# Patient Record
Sex: Female | Born: 1950 | Race: White | Hispanic: No | Marital: Married | Smoking: Former smoker
Health system: Southern US, Community
[De-identification: ages and names within clinical notes are randomized; demographics above are authoritative.]

## PROBLEM LIST (undated history)

## (undated) DIAGNOSIS — K52839 Microscopic colitis, unspecified: Secondary | ICD-10-CM

## (undated) DIAGNOSIS — J439 Emphysema, unspecified: Secondary | ICD-10-CM

## (undated) DIAGNOSIS — M810 Age-related osteoporosis without current pathological fracture: Secondary | ICD-10-CM

## (undated) DIAGNOSIS — Z87891 Personal history of nicotine dependence: Secondary | ICD-10-CM

## (undated) DIAGNOSIS — M419 Scoliosis, unspecified: Secondary | ICD-10-CM

## (undated) DIAGNOSIS — I700 Atherosclerosis of aorta: Secondary | ICD-10-CM

## (undated) DIAGNOSIS — T7840XA Allergy, unspecified, initial encounter: Secondary | ICD-10-CM

## (undated) DIAGNOSIS — J189 Pneumonia, unspecified organism: Secondary | ICD-10-CM

## (undated) DIAGNOSIS — E278 Other specified disorders of adrenal gland: Secondary | ICD-10-CM

## (undated) HISTORY — PX: TONSILLECTOMY AND ADENOIDECTOMY: SUR1326

## (undated) HISTORY — DX: Allergy, unspecified, initial encounter: T78.40XA

## (undated) HISTORY — DX: Atherosclerosis of aorta: I70.0

## (undated) HISTORY — PX: EXPLORATORY LAPAROTOMY: SUR591

## (undated) HISTORY — DX: Personal history of nicotine dependence: Z87.891

## (undated) HISTORY — PX: OTHER SURGICAL HISTORY: SHX169

## (undated) HISTORY — DX: Microscopic colitis, unspecified: K52.839

## (undated) HISTORY — DX: Scoliosis, unspecified: M41.9

## (undated) HISTORY — DX: Emphysema, unspecified: J43.9

## (undated) HISTORY — DX: Pneumonia, unspecified organism: J18.9

## (undated) HISTORY — DX: Age-related osteoporosis without current pathological fracture: M81.0

## (undated) HISTORY — DX: Other specified disorders of adrenal gland: E27.8

---

## 2003-02-23 ENCOUNTER — Encounter: Payer: Self-pay | Admitting: Family Medicine

## 2003-02-23 ENCOUNTER — Encounter: Admission: RE | Admit: 2003-02-23 | Discharge: 2003-02-23 | Payer: Self-pay | Admitting: Internal Medicine

## 2004-08-23 ENCOUNTER — Ambulatory Visit: Payer: Self-pay | Admitting: Family Medicine

## 2005-08-26 ENCOUNTER — Ambulatory Visit: Payer: Self-pay | Admitting: Family Medicine

## 2005-10-22 ENCOUNTER — Ambulatory Visit: Payer: Self-pay | Admitting: Internal Medicine

## 2006-06-25 ENCOUNTER — Encounter: Admission: RE | Admit: 2006-06-25 | Discharge: 2006-06-25 | Payer: Self-pay | Admitting: Family Medicine

## 2006-06-25 LAB — HM MAMMOGRAPHY

## 2006-09-18 ENCOUNTER — Ambulatory Visit: Payer: Self-pay | Admitting: Family Medicine

## 2007-04-30 ENCOUNTER — Ambulatory Visit: Payer: Self-pay | Admitting: Family Medicine

## 2007-04-30 DIAGNOSIS — M8580 Other specified disorders of bone density and structure, unspecified site: Secondary | ICD-10-CM | POA: Insufficient documentation

## 2007-04-30 DIAGNOSIS — Z87891 Personal history of nicotine dependence: Secondary | ICD-10-CM | POA: Insufficient documentation

## 2008-05-12 ENCOUNTER — Telehealth (INDEPENDENT_AMBULATORY_CARE_PROVIDER_SITE_OTHER): Payer: Self-pay | Admitting: Internal Medicine

## 2008-07-25 ENCOUNTER — Encounter (INDEPENDENT_AMBULATORY_CARE_PROVIDER_SITE_OTHER): Payer: Self-pay | Admitting: Internal Medicine

## 2008-10-14 HISTORY — PX: OTHER SURGICAL HISTORY: SHX169

## 2008-10-25 ENCOUNTER — Ambulatory Visit: Payer: Self-pay | Admitting: Family Medicine

## 2008-10-28 ENCOUNTER — Ambulatory Visit: Payer: Self-pay | Admitting: Family Medicine

## 2008-10-28 ENCOUNTER — Other Ambulatory Visit: Admission: RE | Admit: 2008-10-28 | Discharge: 2008-10-28 | Payer: Self-pay | Admitting: Family Medicine

## 2008-10-28 ENCOUNTER — Encounter: Payer: Self-pay | Admitting: Family Medicine

## 2008-10-31 LAB — CONVERTED CEMR LAB
AST: 26 units/L (ref 0–37)
Albumin: 4.2 g/dL (ref 3.5–5.2)
BUN: 23 mg/dL (ref 6–23)
Basophils Absolute: 0 10*3/uL (ref 0.0–0.1)
Basophils Relative: 0.1 % (ref 0.0–3.0)
Calcium: 9.3 mg/dL (ref 8.4–10.5)
Chloride: 105 meq/L (ref 96–112)
Creatinine, Ser: 1 mg/dL (ref 0.4–1.2)
Eosinophils Absolute: 0.1 10*3/uL (ref 0.0–0.7)
Eosinophils Relative: 0.6 % (ref 0.0–5.0)
GFR calc non Af Amer: 61 mL/min
HCT: 43 % (ref 36.0–46.0)
Hemoglobin: 15 g/dL (ref 12.0–15.0)
MCHC: 34.9 g/dL (ref 30.0–36.0)
MCV: 93 fL (ref 78.0–100.0)
Monocytes Absolute: 0.3 10*3/uL (ref 0.1–1.0)
Neutro Abs: 6.5 10*3/uL (ref 1.4–7.7)
RBC: 4.63 M/uL (ref 3.87–5.11)
WBC: 8.4 10*3/uL (ref 4.5–10.5)

## 2008-11-01 ENCOUNTER — Encounter (INDEPENDENT_AMBULATORY_CARE_PROVIDER_SITE_OTHER): Payer: Self-pay | Admitting: *Deleted

## 2008-11-08 ENCOUNTER — Ambulatory Visit: Payer: Self-pay | Admitting: Internal Medicine

## 2008-11-08 ENCOUNTER — Encounter: Payer: Self-pay | Admitting: Family Medicine

## 2010-12-24 ENCOUNTER — Encounter: Payer: Self-pay | Admitting: Family Medicine

## 2010-12-24 ENCOUNTER — Ambulatory Visit (INDEPENDENT_AMBULATORY_CARE_PROVIDER_SITE_OTHER): Payer: PRIVATE HEALTH INSURANCE | Admitting: Family Medicine

## 2010-12-24 DIAGNOSIS — F17200 Nicotine dependence, unspecified, uncomplicated: Secondary | ICD-10-CM

## 2010-12-24 DIAGNOSIS — M810 Age-related osteoporosis without current pathological fracture: Secondary | ICD-10-CM

## 2011-01-01 NOTE — Assessment & Plan Note (Signed)
Summary: TRANSFER FROM [facility]/REFILL MED/QUIT SMOKING/CLE  COVENTRY HE...   Vital Signs:  Patient profile:   60 year old female Height:      63.5 inches Weight:      134.25 pounds BMI:     23.49 Temp:     98.4 degrees F oral Pulse rate:   88 / minute Pulse rhythm:   regular BP sitting:   130 / 84  (left arm) Cuff size:   regular  Vitals Entered By: Selena Batten Dance CMA Duncan Dull) (December 24, 2010 10:37 AM) CC: Transfer from [facility]/Med refill/wants to quit smoking   History of Present Illness: CC: [facility]  1. osteoporosis - actonel monthly for last 5-6 years.  takes calcium/vit D as well.  actually off since June 2011 as a bisphosphonate holiday, ready to restart.  2. tobacco abuse - smoking for last 18 years up to 1 ppd, about 2 cig/day currently.  wants to quit.  has tried wellbutrin in past.  has had script for chantix in past.  wellbutrin did work in past.  has used gum and patch in past, they kept her calm during day.  Using patch daily.    preventative:  tetanus - last  ~5-6 yrs ago (at work).  2005/6 well woman - usually here, last was 2 years ago.  last blood work as well here then.  Current Medications (verified): 1)  Calcium 600/vitamin D 600-200 Mg-Unit  Tabs (Calcium Carbonate-Vitamin D) .... Take 1 Tablet By Mouth Two Times A Day 2)  Actonel 150 Mg Tabs (Risedronate Sodium) .Marland Kitchen.. 1 By Mouth Monthly 3)  Chantix Starting Month Pak 0.5 Mg X 11 & 1 Mg X 42  Misc (Varenicline Tartrate) .... 0.5mg  By Mouth Once Daily For 3 Days, Then Twice Daily For 4 Days and Then 1mg  By Mouth 2 Times Daily 4)  Chantix 1 Mg Tabs (Varenicline Tartrate) .Marland Kitchen.. 1 Tab By Mouth 2 Times Daily 5)  Multivitamins  Tabs (Multiple Vitamin) .Marland Kitchen.. 1 By Mouth Once Daily  Allergies: 1)  ! Pcn  Past History:  Past Medical History: Last updated: 10/25/2008 Osteoporosis Tobacco Abuse  Past Surgical History: veins from legs removed  last Dexa 2010  Family History: GM: DM, osteoporosis, CVA,  CAD/MI (older)  No CA  Social History: smoking 2 cig/day, social EtOH, no rec drugs caffeine: 1 cup coffee/day Occupation: runs dental lab  Lives with husband, outside dog  Review of Systems  The patient denies anorexia, fever, weight loss, weight gain, vision loss, decreased hearing, hoarseness, chest pain, syncope, dyspnea on exertion, peripheral edema, prolonged cough, headaches, hemoptysis, abdominal pain, melena, hematochezia, severe indigestion/heartburn, hematuria, depression, and breast masses.    Physical Exam  General:  alert, no acute distress, well hydrated, well developed, and well nourished.   Head:  Normocephalic and atraumatic without obvious abnormalities. No apparent alopecia or balding. Mouth:  injected, no exudate Neck:  No deformities, masses, or tenderness noted.  no bruits Lungs:  Normal respiratory effort, chest expands symmetrically. Lungs are clear to auscultation, no crackles or wheezes. Heart:  normal rate, regular rhythm, and no murmur.   Abdomen:  nontender, no guarding, normal BS, no hepatosplenomegaly, and no hernias.   Msk:  No deformity or scoliosis noted of thoracic or lumbar spine.   Pulses:  2+ rad pulses bilaterally, brisk cap refill Extremities:  no pedal edema Neurologic:  CN grossly intact, station and gait intact Skin:  turgor normal and color normal.   Psych:  full affect, pleasant and  cooperative   Impression & Recommendations:  Problem # 1:  OSTEOPOROSIS (ICD-733.00) refilled actonel.  check vit D next blood draw.  discussed different treatments of OP including bisphosphonates and cal/vit D.  discussed bisphosphonate holiday.  pt wants to restart.  Her updated medication list for this problem includes:    Calcium 600/vitamin D 600-200 Mg-unit Tabs (Calcium carbonate-vitamin d) .Marland Kitchen... Take 1 tablet by mouth two times a day    Actonel 150 Mg Tabs (Risedronate sodium) .Marland Kitchen... 1 by mouth monthly  Problem # 2:  TOBACCO ABUSE  (ICD-305.1) discussed different methods to quit.  pt currently on patch.  would like to restart wellbutrin.  discussed this method as well as setting quit date.  RTC 1 mo for f/u.  The following medications were removed from the medication list:    Chantix Starting Month Pak 0.5 Mg X 11 & 1 Mg X 42 Misc (Varenicline tartrate) .Marland Kitchen... 0.5mg  by mouth once daily for 3 days, then twice daily for 4 days and then 1mg  by mouth 2 times daily    Chantix 1 Mg Tabs (Varenicline tartrate) .Marland Kitchen... 1 tab by mouth 2 times daily Her updated medication list for this problem includes:    Buproban 150 Mg Xr12h-tab (Bupropion hcl (smoking deter)) .Marland Kitchen... Take one daily for 4 days then one by mouth two times a day  Problem # 3:  HEALTH MAINTENANCE EXAM (ICD-V70.0) cardiovascular risk - no personal/family hx at early age, no HTN, DM.  not obese.  working on quitting smoking.  further risk stratify with FLP  Complete Medication List: 1)  Multivitamins Tabs (Multiple vitamin) .Marland Kitchen.. 1 by mouth once daily 2)  Calcium 600/vitamin D 600-200 Mg-unit Tabs (Calcium carbonate-vitamin d) .... Take 1 tablet by mouth two times a day 3)  Actonel 150 Mg Tabs (Risedronate sodium) .Marland Kitchen.. 1 by mouth monthly 4)  Vitamin B-12 100 Mcg Tabs (Cyanocobalamin) .... One daily 5)  Buproban 150 Mg Xr12h-tab (Bupropion hcl (smoking deter)) .... Take one daily for 4 days then one by mouth two times a day  Patient Instructions: 1)  return in 1 month for follow up. 2)  Return fasting for blood work at your convenience [FLP, CMP, TSH, CBC, vit D v70.0, 733.00, 305.1] 3)  [email or web address] - look for resources. 4)  trial of wellbutrin again for smoking cessation. 5)  actonel refilled for 1 year 6)  wellbutrin to quit smoking - start 1 pill daily for 4 days then increase to 2 pills daily.  quit date 1-2 weeks into wellbutrin. 7)  Good to see you today, call clinic with quesitons Prescriptions: BUPROBAN 150 MG XR12H-TAB (BUPROPION HCL (SMOKING DETER))  take one daily for 4 days then one by mouth two times a day  #60 x 1   Entered and Authorized by:   Eustaquio Boyden  MD   Signed by:   Eustaquio Boyden  MD on 12/24/2010   Method used:   Electronically to        [facility] [address] (retail)       [address]       [place], [place]  [id number]       Ph: [phone]       Fax: [phone]   RxID:   [id number] ACTONEL 150 MG TABS (RISEDRONATE SODIUM) 1 by mouth monthly  #1 Tablet x 11   Entered and Authorized by:   Eustaquio Boyden  MD   Signed by:   Eustaquio Boyden  MD on 12/24/2010  Method used:   Electronically to        [facility] (retail)       [address]       [place], [place]  [id number]       Ph: [phone]       Fax: [phone]   RxID:   [id number]    Orders Added: 1)  Est. Patient Level IV [27253]    Current Allergies (reviewed today): ! PCN  Appended Document: TRANSFER FROM [facility]/REFILL MED/QUIT SMOKING/CLE  COVENTRY HE...    Clinical Lists Changes  Observations: Added new observation of PAST SURG HX: veins from legs removed  DEXA - osteoporosis 10/2008 (12/24/2010 13:27)        Allergies: 1)  ! Pcn   Past History:  Past Surgical History: veins from legs removed  DEXA - osteoporosis 10/2008  Appended Document: TRANSFER FROM [facility]/REFILL MED/QUIT SMOKING/CLE  COVENTRY HE...    Clinical Lists Changes  Observations: Added new observation of MAMMOGRAM: Birads1 (06/25/2006 21:19) Added new observation of TD BOOSTER: Td (04/04/2004 21:21)         -  Date:  06/25/2006    Mammogram Birads1  Date:  04/04/2004    TD booster Td

## 2011-01-17 ENCOUNTER — Other Ambulatory Visit: Payer: Self-pay | Admitting: Family Medicine

## 2011-01-17 ENCOUNTER — Encounter: Payer: Self-pay | Admitting: Family Medicine

## 2011-01-17 DIAGNOSIS — F17200 Nicotine dependence, unspecified, uncomplicated: Secondary | ICD-10-CM

## 2011-01-17 DIAGNOSIS — M810 Age-related osteoporosis without current pathological fracture: Secondary | ICD-10-CM

## 2011-01-17 DIAGNOSIS — Z0000 Encounter for general adult medical examination without abnormal findings: Secondary | ICD-10-CM

## 2011-01-22 ENCOUNTER — Other Ambulatory Visit (INDEPENDENT_AMBULATORY_CARE_PROVIDER_SITE_OTHER): Payer: PRIVATE HEALTH INSURANCE | Admitting: Family Medicine

## 2011-01-22 DIAGNOSIS — M810 Age-related osteoporosis without current pathological fracture: Secondary | ICD-10-CM

## 2011-01-22 DIAGNOSIS — Z0000 Encounter for general adult medical examination without abnormal findings: Secondary | ICD-10-CM

## 2011-01-22 DIAGNOSIS — F17200 Nicotine dependence, unspecified, uncomplicated: Secondary | ICD-10-CM

## 2011-01-22 LAB — CBC WITH DIFFERENTIAL/PLATELET
Basophils Relative: 0.3 % (ref 0.0–3.0)
Eosinophils Absolute: 0.1 10*3/uL (ref 0.0–0.7)
Eosinophils Relative: 0.9 % (ref 0.0–5.0)
Hemoglobin: 14.8 g/dL (ref 12.0–15.0)
Lymphocytes Relative: 12.2 % (ref 12.0–46.0)
MCHC: 33.7 g/dL (ref 30.0–36.0)
Monocytes Relative: 5.1 % (ref 3.0–12.0)
Neutro Abs: 9.3 10*3/uL — ABNORMAL HIGH (ref 1.4–7.7)
RBC: 4.55 Mil/uL (ref 3.87–5.11)

## 2011-01-22 LAB — LIPID PANEL
HDL: 86.2 mg/dL (ref >39.00)
LDL Cholesterol: 92 mg/dL (ref 0–99)
Total CHOL/HDL Ratio: 2
Triglycerides: 80 mg/dL (ref 0.0–149.0)

## 2011-01-22 LAB — COMPREHENSIVE METABOLIC PANEL
ALT: 25 U/L (ref 0–35)
CO2: 30 mEq/L (ref 19–32)
Creatinine, Ser: 0.9 mg/dL (ref 0.4–1.2)
GFR: 64.57 mL/min (ref >60.00)
Total Bilirubin: 0.8 mg/dL (ref 0.3–1.2)

## 2011-01-24 ENCOUNTER — Ambulatory Visit: Payer: PRIVATE HEALTH INSURANCE | Admitting: Family Medicine

## 2011-06-19 ENCOUNTER — Other Ambulatory Visit: Payer: Self-pay | Admitting: Internal Medicine

## 2011-06-20 NOTE — Telephone Encounter (Signed)
Not previously on list.  Denied.

## 2011-06-20 NOTE — Telephone Encounter (Signed)
Please Advise refills 

## 2011-07-15 HISTORY — PX: OTHER SURGICAL HISTORY: SHX169

## 2011-07-22 ENCOUNTER — Telehealth: Payer: Self-pay | Admitting: *Deleted

## 2011-07-22 NOTE — Telephone Encounter (Signed)
Prior Berkley Harvey is needed for actonel, form is in your in box.

## 2011-07-22 NOTE — Telephone Encounter (Signed)
Please ask if has taken fosamax or boniva in past, if not will need to switch to fosamax.

## 2011-07-23 NOTE — Telephone Encounter (Signed)
Message left for patient to return my call and advise about previous meds.

## 2011-07-25 MED ORDER — ALENDRONATE SODIUM 70 MG PO TABS: 70.0000 mg | ORAL_TABLET | ORAL | Status: DC

## 2011-07-25 NOTE — Telephone Encounter (Signed)
Patient notified and will pick up Rx today. 

## 2011-07-25 NOTE — Telephone Encounter (Signed)
Spoke with patient. She said she had tried Fosamax in the past. She said she didn't have any problems with it, but said that [facility] had wanted her to switch to the Actonel because it was "new and improved". She said if she needed to go back on the fosamax, she would. She said whichever med you think is the best for her is what she will use.

## 2011-07-25 NOTE — Telephone Encounter (Signed)
will change to fosamax weekly.

## 2011-08-06 ENCOUNTER — Ambulatory Visit (INDEPENDENT_AMBULATORY_CARE_PROVIDER_SITE_OTHER): Payer: BC Managed Care – PPO | Admitting: Family Medicine

## 2011-08-06 ENCOUNTER — Ambulatory Visit: Payer: PRIVATE HEALTH INSURANCE | Admitting: Family Medicine

## 2011-08-06 ENCOUNTER — Encounter: Payer: Self-pay | Admitting: Family Medicine

## 2011-08-06 VITALS — BP 140/80 | HR 88 | Temp 98.7°F | Wt 132.8 lb

## 2011-08-06 DIAGNOSIS — I10 Essential (primary) hypertension: Secondary | ICD-10-CM

## 2011-08-06 DIAGNOSIS — R51 Headache: Secondary | ICD-10-CM | POA: Insufficient documentation

## 2011-08-06 DIAGNOSIS — F17200 Nicotine dependence, unspecified, uncomplicated: Secondary | ICD-10-CM

## 2011-08-06 DIAGNOSIS — R0789 Other chest pain: Secondary | ICD-10-CM

## 2011-08-06 DIAGNOSIS — R519 Headache, unspecified: Secondary | ICD-10-CM | POA: Insufficient documentation

## 2011-08-06 LAB — BASIC METABOLIC PANEL
Calcium: 9.3 mg/dL (ref 8.4–10.5)
GFR: 87.68 mL/min (ref >60.00)
Glucose, Bld: 101 mg/dL — ABNORMAL HIGH (ref 70–99)
Potassium: 5.1 mEq/L (ref 3.5–5.1)
Sodium: 144 mEq/L (ref 135–145)

## 2011-08-06 NOTE — Assessment & Plan Note (Addendum)
Not typical chest pain - not exhertional, not relieved by rest, not crescendo.   However risk factors include smoking, recent HTN (although no hx HTN), fmhx CAD (not at early age).   Lab Results  Component Value Date   LDLCALC 92 01/22/2011  last LDL acceptable range. Check blood work today - refer to cards for further risk stratification. Encouraged smoking cessation. No chest pain today. EKG today - NSR 86, normal axis, normal intervals.  no ST/T changes. Recommend start aspirin 81mg  daily, may take enteric coalt

## 2011-08-06 NOTE — Assessment & Plan Note (Signed)
Encouraged cessation, pt willing to try zyban, has script at home.

## 2011-08-06 NOTE — Progress Notes (Signed)
Subjective:    Patient ID: Lindsay Mason, female    DOB: 1951-07-09, 60 y.o.   MRN: [id number]  HPI CC: check bp  Bad headache 2 weekends ago, last Monday with bad HA with vision changes (tunnelling) when driving, had to pull over.  Tuesday jaw pain and chest pain.  Did not seek medical care.  Did take some aspirin on Tuesday.  Mid SOB over last few weeks.  Wednesday noticed R eye redness, no pain.  Saturday went to health clinic at church, bp checked and was 184/114.  Never this high in past.  Not stressed.  Brings log of bp's have otherwise been 111-141/70-91.  Endorses trouble with fine motor skills at work recently - hitting wrong letters on keyboard but was able to go to work all week long.  Some left hand numbness.  Chest tightness described as heaviness in chest, episodes occurring randomly, not necessarily associated with exhertion, not relieved with rest.  Takes aspirin to relieve discomfort.  Last occurred last night, lasted 1 hour.  Has gotten nauseated with them.  No diaphoresis.  No presyncope or LOC, other vision changes, leg swelling.  No confusion, trouble speaking or slurred speech, facial droop.  No lack of coordination.  Osteoporosis - previously on actonel for several years, recently changed to fosamax.  Has been on bisphosphonate for years.  Did have holiday for 6 months.  Back to smoking - zyban had worked well in past.  Went through stressful situation in 06-17-2023 - mother died, sudden but expected.  Restarted smoking, curently 4-5 cig/day.  Does want to quit again, has zyban refill.  Lab Results  Component Value Date   LDLCALC 92 01/22/2011   Medications and allergies reviewed and updated in chart.  Past histories reviewed and updated if relevant as below. Patient Active Problem List  Diagnoses  . TOBACCO ABUSE  . OSTEOPOROSIS   Past Medical History  Diagnosis Date  . Routine general medical examination at a health care facility   . Osteoporosis, unspecified   .  Tobacco use disorder    Past Surgical History  Procedure Date  . Vein removal     removed from legs  . Dexa 10/2008    Osteoporosis   History  Substance Use Topics  . Smoking status: Current Everyday Smoker    Types: Cigarettes  . Smokeless tobacco: Not on file   Comment: 4-5 cig/day  . Alcohol Use: Yes     social   Family History  Problem Relation Age of Onset  . Osteoporosis      GM  . Stroke Maternal Grandmother   . Coronary artery disease Maternal Grandfather 50  . Diabetes Maternal Grandfather   . Coronary artery disease Paternal Grandmother 62  . Coronary artery disease Paternal Grandfather 16  . Cancer Neg Hx    Allergies  Allergen Reactions  . Penicillins     REACTION: swelling tongue   Current Outpatient Prescriptions on File Prior to Visit  Medication Sig Dispense Refill  . alendronate (FOSAMAX) 70 MG tablet Take 1 tablet (70 mg total) by mouth every 7 (seven) days. Take with a full glass of water on an empty stomach.  4 tablet  6  . Calcium Carbonate-Vitamin D (CALCIUM 600+D) 600-200 MG-UNIT TABS Take 1 tablet by mouth 2 (two) times daily.        . Multiple Vitamin (MULTIVITAMIN) tablet Take 1 tablet by mouth daily.        . vitamin B-12 (CYANOCOBALAMIN) 100 MCG  tablet Take 100 mcg by mouth daily.        Marland Kitchen buPROPion (ZYBAN) 150 MG 12 hr tablet Take 150 mg by mouth as directed.         Review of Systems Per HPI    Objective:   Physical Exam  Nursing note and vitals reviewed. Constitutional: She appears well-developed and well-nourished. No distress.  HENT:  Head: Normocephalic and atraumatic.  Mouth/Throat: Oropharynx is clear and moist. No oropharyngeal exudate.  Eyes: EOM are normal. Pupils are equal, round, and reactive to light. Right conjunctiva is not injected. Right conjunctiva has a hemorrhage. Left conjunctiva is not injected. Left conjunctiva has no hemorrhage. No scleral icterus.         R nasal subconjunctival hemorrhage PERRLA, EOMI    Neck: Normal range of motion. Neck supple. Carotid bruit is not present.  Cardiovascular: Normal rate, regular rhythm, normal heart sounds and intact distal pulses.   No murmur heard. Pulmonary/Chest: Effort normal and breath sounds normal. No respiratory distress. She has no wheezes. She has no rales.  Abdominal: Soft. Bowel sounds are normal. She exhibits no distension and no mass. There is no tenderness. There is no rebound and no guarding.       No abd/renal bruits  Musculoskeletal: She exhibits no edema.  Lymphadenopathy:    She has no cervical adenopathy.  Neurological: She has normal strength. No cranial nerve deficit or sensory deficit. She exhibits normal muscle tone. She displays a negative Romberg sign. Coordination normal.       Normal finger to nose.  Skin: Skin is warm and dry. No rash noted.  Psychiatric: She has a normal mood and affect.      Assessment & Plan:

## 2011-08-06 NOTE — Patient Instructions (Signed)
I am worried about the heart and the head.  I would like [place] to set you up for ct scan of head and then refer you to heart doctor. Pass by Marion's office to set this up. Keep an eye on blood pressures - if staying elevated call me with an update. If any chest pain or worsening headache, please let us know. I'd remove contacts for now. Good to see you today, call us with questions.

## 2011-08-06 NOTE — Assessment & Plan Note (Addendum)
With subconjunctival hemorrhage L side in setting of severely elevated BP readings this past week. Given recent headaches and elevated bp, also endorsing numbness of left hand and fine motor trouble, check CT head to r/o CVA. Noted concern for CVA, will not start bp med, seems to be stabilizing on it's own.  In office 140/80. rec start baby ASA, rec smoking cessation.

## 2011-08-07 LAB — CK TOTAL AND CKMB (NOT AT ARMC)
CK, MB: 2.6 ng/mL (ref 0.3–4.0)
Total CK: 75 U/L (ref 7–177)

## 2011-08-07 LAB — TROPONIN I: Troponin I: <0.01 ng/mL (ref <0.06)

## 2011-08-08 ENCOUNTER — Encounter: Payer: Self-pay | Admitting: Cardiology

## 2011-08-08 ENCOUNTER — Telehealth: Payer: Self-pay | Admitting: Family Medicine

## 2011-08-08 ENCOUNTER — Ambulatory Visit (INDEPENDENT_AMBULATORY_CARE_PROVIDER_SITE_OTHER)
Admission: RE | Admit: 2011-08-08 | Discharge: 2011-08-08 | Disposition: A | Discharge status: Home / Self Care | Payer: BC Managed Care – PPO | Source: Ambulatory Visit | Attending: Family Medicine | Admitting: Family Medicine

## 2011-08-08 ENCOUNTER — Ambulatory Visit (INDEPENDENT_AMBULATORY_CARE_PROVIDER_SITE_OTHER): Payer: BC Managed Care – PPO | Admitting: Cardiology

## 2011-08-08 DIAGNOSIS — R0789 Other chest pain: Secondary | ICD-10-CM

## 2011-08-08 DIAGNOSIS — R51 Headache: Secondary | ICD-10-CM

## 2011-08-08 DIAGNOSIS — I10 Essential (primary) hypertension: Secondary | ICD-10-CM

## 2011-08-08 DIAGNOSIS — F17200 Nicotine dependence, unspecified, uncomplicated: Secondary | ICD-10-CM

## 2011-08-08 NOTE — Telephone Encounter (Signed)
Spoke with patient - unsure if did have stroke, concern for this.  Will set up witt MRI.  placed order in chart and routed to [facility]

## 2011-08-08 NOTE — Patient Instructions (Signed)
Your physician has requested that you have an exercise tolerance test. For further information please visit [email or web address]. Please also follow instruction sheet, as given.  The current medical regimen is effective;  continue present plan and medications.  

## 2011-08-08 NOTE — Assessment & Plan Note (Signed)
We had a long discussion about this and she plans to resume Zyban.

## 2011-08-08 NOTE — Telephone Encounter (Signed)
Called home, just left for work.  Will try later.  Would like to set up with MRI head w/w/o contrast given abnormal head CT  BP Readings from Last 3 Encounters:  08/08/11 126/86  08/06/11 140/80  12/24/10 130/84

## 2011-08-08 NOTE — Progress Notes (Signed)
HPI The patient presents for evaluation of chest pain.  She reports some upper epigastric discomfort and mild jaw discomfort about a week ago. This lasted for about one hour. She also had a headache. A couple of days later she developed a conjunctival hemorrhage. She went to a health fair and was noted to have a blood pressure of 184/114. She started to take her blood pressure at home following this and was up slightly for one day but has come down otherwise. She had in retrospect noticed some visual disturbances at the beginning of all of this with some "white spots" in the peripheral vision. Since this chest discomfort is happened she sat a slightly different discomfort in the left upper chest. It happens at rest but usually following emotional stress. She has a fairly stressful job. It is mild. There is no radiation. It might last 20-30 minutes. There are no associated symptoms such as nausea vomiting or diaphoresis though she might get very slightly SOB.  Allergies  Allergen Reactions  . Penicillins     REACTION: swelling tongue    Current Outpatient Prescriptions  Medication Sig Dispense Refill  . alendronate (FOSAMAX) 70 MG tablet Take 1 tablet (70 mg total) by mouth every 7 (seven) days. Take with a full glass of water on an empty stomach.  4 tablet  6  . aspirin EC 81 MG tablet Take 81 mg by mouth daily.        . Calcium Carbonate-Vitamin D (CALCIUM 600+D) 600-200 MG-UNIT TABS Take 1 tablet by mouth 2 (two) times daily.        Marland Kitchen doxycycline (VIBRA-TABS) 100 MG tablet as directed.       . Multiple Vitamin (MULTIVITAMIN) tablet Take 1 tablet by mouth daily.        . vitamin B-12 (CYANOCOBALAMIN) 100 MCG tablet Take 100 mcg by mouth daily.          Past Medical History  Diagnosis Date  . Osteoporosis, unspecified   . Tobacco use disorder     Past Surgical History  Procedure Date  . Vein removal     removed from legs  . Dexa 10/2008    Osteoporosis    Family History  Problem  Relation Age of Onset  . Osteoporosis      GM  . Stroke Maternal Grandmother   . Coronary artery disease Maternal Grandfather 50  . Diabetes Maternal Grandfather   . Coronary artery disease Paternal Grandmother 67  . Coronary artery disease Paternal Grandfather 67  . Cancer Neg Hx     History   Social History  . Marital Status: Married    Spouse Name: N/A    Number of Children: N/A  . Years of Education: N/A   Occupational History  . runs dental lab    Social History Main Topics  . Smoking status: Current Everyday Smoker    Types: Cigarettes  . Smokeless tobacco: Not on file   Comment: 4-5 cig/day  . Alcohol Use: Yes     social  . Drug Use: No  . Sexually Active: Not on file   Other Topics Concern  . Not on file   Social History Narrative   Caffeine: 1 cup coffee/dayLives with husband, outside dog   ROS:  Positive for toe cramping.  Otherwise as stated in the HPI and negative for all other systems.  PHYSICAL EXAM BP 126/86  Pulse 84  Ht 5' 3.5" (1.613 m)  Wt 131 lb (59.421 kg)  BMI 22.84  kg/m2 GENERAL:  Well appearing HEENT:  Pupils equal round and reactive, fundi not visualized, oral mucosa unremarkable NECK:  No jugular venous distention, waveform within normal limits, carotid upstroke brisk and symmetric, no bruits, no thyromegaly LYMPHATICS:  No cervical, inguinal adenopathy LUNGS:  Clear to auscultation bilaterally BACK:  No CVA tenderness CHEST:  Unremarkable HEART:  PMI not displaced or sustained,S1 and S2 within normal limits, no S3, no S4, no clicks, no rubs, no murmurs ABD:  Flat, positive bowel sounds normal in frequency in pitch, no bruits, no rebound, no guarding, no midline pulsatile mass, no hepatomegaly, no splenomegaly EXT:  2 plus pulses throughout, no edema, no cyanosis no clubbing SKIN:  No rashes no nodules NEURO:  Cranial nerves II through XII grossly intact, motor grossly intact throughout PSYCH:  Cognitively intact, oriented to person  place and time  EKG:  Sinus rhythm, rate 86, axis within normal limits, intervals within normal limits, no acute ST-T wave changes.   ASSESSMENT AND PLAN

## 2011-08-08 NOTE — Assessment & Plan Note (Signed)
Her blood pressure is OK at home. No therapy is indicated.

## 2011-08-08 NOTE — Assessment & Plan Note (Signed)
The chest discomfort is predominantly atypical though there are some typical features. She does have risk factors. I do think the pretest probability of obstructive coronary disease as an etiology is low. I will bring the patient back for a POET (Plain Old Exercise Test). This will allow me to screen for obstructive coronary disease, risk stratify and very importantly provide a prescription for exercise.

## 2011-08-09 MED ORDER — LORAZEPAM 1 MG PO TABS: ORAL_TABLET | ORAL | Status: DC

## 2011-08-09 NOTE — Telephone Encounter (Signed)
Pt requests nerve medicine for MRI scheduled for Sunday.  spoke with pt.  Called in ativan.

## 2011-08-11 ENCOUNTER — Other Ambulatory Visit: Payer: BC Managed Care – PPO

## 2011-08-11 ENCOUNTER — Ambulatory Visit
Admission: RE | Admit: 2011-08-11 | Discharge: 2011-08-11 | Disposition: A | Discharge status: Home / Self Care | Payer: BC Managed Care – PPO | Source: Ambulatory Visit | Attending: Family Medicine | Admitting: Family Medicine

## 2011-08-11 DIAGNOSIS — R51 Headache: Secondary | ICD-10-CM

## 2011-08-11 DIAGNOSIS — I10 Essential (primary) hypertension: Secondary | ICD-10-CM

## 2011-08-11 DIAGNOSIS — F17200 Nicotine dependence, unspecified, uncomplicated: Secondary | ICD-10-CM

## 2011-08-11 MED ORDER — GADOBENATE DIMEGLUMINE 529 MG/ML IV SOLN
10.0000 mL | Freq: Once | INTRAVENOUS | Status: AC | PRN
  Administered 2011-08-11: 10 mL via INTRAVENOUS

## 2011-08-12 ENCOUNTER — Telehealth: Payer: Self-pay | Admitting: Family Medicine

## 2011-08-12 ENCOUNTER — Other Ambulatory Visit: Payer: BC Managed Care – PPO

## 2011-08-12 NOTE — Telephone Encounter (Signed)
Please notify MRI showed no evidence of acute stroke. It did show either age and smoking related plaque buildup in the brain or raised possibility of multiple sclerosis. Before last week, had she had any episodes of transient vision loss, arm or leg numbness, or period where she was more unsteady on her feet?  If so, I'd like to refer to neurology.  If not, I'd like her to come in to further discuss results.

## 2011-08-12 NOTE — Telephone Encounter (Signed)
Spoke with patient. No episodes of transient neurological changes. Will watch for now. Will return to see me in next 1-2 mo for f/u to monitor bp and recheck cholesterol levels, more fully discuss imaging results.  bp running slightly elevated - 130-150s/80-90s.  Pt hesitant to start meds, wants to work on diet changes - discussed low salt, high potassium.  If staying elevated, agrees to start med when returns to see me.

## 2011-08-13 ENCOUNTER — Ambulatory Visit: Payer: BC Managed Care – PPO | Admitting: Cardiology

## 2011-09-19 ENCOUNTER — Telehealth: Payer: Self-pay | Admitting: Internal Medicine

## 2011-09-19 ENCOUNTER — Emergency Department (HOSPITAL_COMMUNITY)
Admission: EM | Admit: 2011-09-19 | Discharge: 2011-09-19 | Disposition: A | Discharge status: Home / Self Care | Payer: BC Managed Care – PPO | Attending: Emergency Medicine | Admitting: Emergency Medicine

## 2011-09-19 ENCOUNTER — Encounter (HOSPITAL_COMMUNITY): Payer: Self-pay | Admitting: Emergency Medicine

## 2011-09-19 ENCOUNTER — Other Ambulatory Visit: Payer: Self-pay

## 2011-09-19 ENCOUNTER — Emergency Department (HOSPITAL_COMMUNITY): Payer: BC Managed Care – PPO

## 2011-09-19 DIAGNOSIS — Z79899 Other long term (current) drug therapy: Secondary | ICD-10-CM | POA: Insufficient documentation

## 2011-09-19 DIAGNOSIS — R059 Cough, unspecified: Secondary | ICD-10-CM | POA: Insufficient documentation

## 2011-09-19 DIAGNOSIS — R0602 Shortness of breath: Secondary | ICD-10-CM | POA: Insufficient documentation

## 2011-09-19 DIAGNOSIS — J3489 Other specified disorders of nose and nasal sinuses: Secondary | ICD-10-CM | POA: Insufficient documentation

## 2011-09-19 DIAGNOSIS — R630 Anorexia: Secondary | ICD-10-CM | POA: Insufficient documentation

## 2011-09-19 DIAGNOSIS — R55 Syncope and collapse: Secondary | ICD-10-CM | POA: Insufficient documentation

## 2011-09-19 DIAGNOSIS — M810 Age-related osteoporosis without current pathological fracture: Secondary | ICD-10-CM | POA: Insufficient documentation

## 2011-09-19 DIAGNOSIS — R197 Diarrhea, unspecified: Secondary | ICD-10-CM | POA: Insufficient documentation

## 2011-09-19 DIAGNOSIS — R05 Cough: Secondary | ICD-10-CM | POA: Insufficient documentation

## 2011-09-19 DIAGNOSIS — J40 Bronchitis, not specified as acute or chronic: Secondary | ICD-10-CM | POA: Insufficient documentation

## 2011-09-19 DIAGNOSIS — R071 Chest pain on breathing: Secondary | ICD-10-CM | POA: Insufficient documentation

## 2011-09-19 DIAGNOSIS — Z7982 Long term (current) use of aspirin: Secondary | ICD-10-CM | POA: Insufficient documentation

## 2011-09-19 DIAGNOSIS — R110 Nausea: Secondary | ICD-10-CM | POA: Insufficient documentation

## 2011-09-19 LAB — URINALYSIS, ROUTINE W REFLEX MICROSCOPIC
Glucose, UA: NEGATIVE mg/dL
Ketones, ur: 15 mg/dL — AB
Protein, ur: 100 mg/dL — AB
pH: 5.5 (ref 5.0–8.0)

## 2011-09-19 LAB — DIFFERENTIAL
Basophils Absolute: 0 10*3/uL (ref 0.0–0.1)
Basophils Relative: 0 % (ref 0–1)
Eosinophils Absolute: 0 10*3/uL (ref 0.0–0.7)
Monocytes Relative: 8 % (ref 3–12)
Neutro Abs: 5.3 10*3/uL (ref 1.7–7.7)
Neutrophils Relative %: 82 % — ABNORMAL HIGH (ref 43–77)

## 2011-09-19 LAB — URINE MICROSCOPIC-ADD ON

## 2011-09-19 LAB — COMPREHENSIVE METABOLIC PANEL
ALT: 34 U/L (ref 0–35)
AST: 51 U/L — ABNORMAL HIGH (ref 0–37)
Albumin: 4.4 g/dL (ref 3.5–5.2)
Alkaline Phosphatase: 78 U/L (ref 39–117)
BUN: 16 mg/dL (ref 6–23)
Chloride: 103 mEq/L (ref 96–112)
Potassium: 3.6 mEq/L (ref 3.5–5.1)
Sodium: 143 mEq/L (ref 135–145)
Total Bilirubin: 0.2 mg/dL — ABNORMAL LOW (ref 0.3–1.2)
Total Protein: 7.4 g/dL (ref 6.0–8.3)

## 2011-09-19 LAB — CARDIAC PANEL(CRET KIN+CKTOT+MB+TROPI)
CK, MB: 5 ng/mL — ABNORMAL HIGH (ref 0.3–4.0)
Total CK: 723 U/L — ABNORMAL HIGH (ref 7–177)

## 2011-09-19 LAB — CBC
Hemoglobin: 16.3 g/dL — ABNORMAL HIGH (ref 12.0–15.0)
MCH: 32 pg (ref 26.0–34.0)
MCHC: 33.3 g/dL (ref 30.0–36.0)
Platelets: 187 10*3/uL (ref 150–400)

## 2011-09-19 MED ORDER — AZITHROMYCIN 250 MG PO TABS: ORAL_TABLET | ORAL | Status: DC

## 2011-09-19 MED ORDER — HYDROCOD POLST-CHLORPHEN POLST 10-8 MG/5ML PO LQCR: 5.0000 mL | Freq: Two times a day (BID) | ORAL | Status: DC

## 2011-09-19 NOTE — ED Notes (Signed)
Report received, assumed care.  

## 2011-09-19 NOTE — Telephone Encounter (Signed)
Patient's husband called and stated that the patient has been sick x3 days with congestion and stomach bug. This morning when she was standing in the kitchen she began to shake violently in her arms and passed out. I talked with the Dr. Reece Agar CMA and since no one had any openings we instructed her to go to the ER. That this could be a neurological problem.      Previous note closed in error.

## 2011-09-19 NOTE — ED Provider Notes (Signed)
History     CSN: [id number] Arrival date & time: 09/19/2011 12:57 PM   None     Chief Complaint  Patient presents with  . Loss of Consciousness    (Consider location/radiation/quality/duration/timing/severity/associated sxs/prior treatment) HPI Comments: Patient is a 60 year old woman who has been sick with a cold this week she has had a bad cough and left pleuritic chest pain. Has also been some nausea and diarrhea. He has not eaten very much in the past 3 days. This morning she was in the kitchen, and had twitching of her hands and head, and collapsed on the floor, was unconscious for about 15-30 seconds. She had no prior episode of syncope. There was no chest pain. This event happened around 9 or 9:30 AM.  Patient is a 60 y.o. female presenting with syncope. The history is provided by the patient and the spouse. No language interpreter was used.  Loss of Consciousness This is a new problem. The current episode started 3 to 5 hours ago. The problem has been gradually improving. Associated symptoms comments: Cough and shortness of breath and left pleuritic chest pain.. The symptoms are aggravated by nothing. The symptoms are relieved by nothing. She has tried nothing for the symptoms.    Past Medical History  Diagnosis Date  . Osteoporosis, unspecified   . Tobacco use disorder     Past Surgical History  Procedure Date  . Vein removal     removed from legs  . Dexa 10/2008    Osteoporosis  . Tonsillectomy and adenoidectomy   . Exploratory laparotomy     Family History  Problem Relation Age of Onset  . Osteoporosis      GM  . Stroke Maternal Grandmother   . Coronary artery disease Maternal Grandfather 50  . Diabetes Maternal Grandfather   . Coronary artery disease Paternal Grandmother 66  . Coronary artery disease Paternal Grandfather 51  . Cancer Neg Hx     History  Substance Use Topics  . Smoking status: Current Everyday Smoker    Types: Cigarettes  . Smokeless  tobacco: Not on file   Comment: 4-5 cig/day  . Alcohol Use: Yes     social    OB History    Grav Para Term Preterm Abortions TAB SAB Ect Mult Living                  Review of Systems  Constitutional: Negative.  Negative for fever.  HENT: Positive for congestion and rhinorrhea.   Eyes: Negative.   Respiratory: Positive for cough.        Left lateral pleuritic chest pain.  Cardiovascular: Positive for syncope.       Syncope.  Gastrointestinal: Negative.   Genitourinary: Negative.   Musculoskeletal: Negative.   Neurological: Positive for syncope.  Psychiatric/Behavioral: Negative.     Allergies  Penicillins  Home Medications   Current Outpatient Rx  Name Route Sig Dispense Refill  . ALENDRONATE SODIUM 70 MG PO TABS Oral Take 70 mg by mouth every 7 (seven) days. sundays     . ASPIRIN EC 81 MG PO TBEC Oral Take 81 mg by mouth daily.      Marland Kitchen CALCIUM CARBONATE-VITAMIN D 600-200 MG-UNIT PO TABS Oral Take 1 tablet by mouth 2 (two) times daily.      Marland Kitchen ONE-DAILY MULTI VITAMINS PO TABS Oral Take 1 tablet by mouth daily.      Marland Kitchen VITAMIN B-12 100 MCG PO TABS Oral Take 100 mcg by mouth daily.      Marland Kitchen  AZITHROMYCIN 250 MG PO TABS  Take 2 tablets today, then 1 every day until finished. 6 tablet 0  . HYDROCOD POLST-CHLORPHEN POLST 10-8 MG/5ML PO LQCR Oral Take 5 mLs by mouth every 12 (twelve) hours. 60 mL 0  . DOXYCYCLINE HYCLATE 100 MG PO TABS Oral Take 25 mg by mouth See admin instructions. Pt takes a 1/4 tablet 4 times a day      BP 104/63  Pulse 106  Temp(Src) 98.5 F (36.9 C) (Oral)  Resp 28  SpO2 98%  Physical Exam  Nursing note and vitals reviewed. Constitutional: She is oriented to person, place, and time. She appears well-developed and well-nourished. No distress.  HENT:  Head: Normocephalic and atraumatic.  Right Ear: External ear normal.  Left Ear: External ear normal.  Mouth/Throat: Oropharynx is clear and moist.  Eyes: Conjunctivae and EOM are normal. Pupils are  equal, round, and reactive to light.  Neck: Normal range of motion. Neck supple.  Cardiovascular: Normal rate, regular rhythm and normal heart sounds.   Pulmonary/Chest: Effort normal and breath sounds normal.  Abdominal: Soft. Bowel sounds are normal.  Musculoskeletal: Normal range of motion.  Neurological: She is alert and oriented to person, place, and time.       No sensory or motor deficit.  Skin: Skin is warm and dry.  Psychiatric: She has a normal mood and affect. Her behavior is normal.    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  ST/T Wave abnormalities: nonspecific ST/T changes  Conduction Disutrbances:none  Narrative Interpretation: Abnormal eKG.  Old EKG Reviewed: none available   Results for orders placed during the hospital encounter of 09/19/11  CBC      Component Value Range   WBC 6.5  4.0 - 10.5 (K/uL)   RBC 5.09  3.87 - 5.11 (MIL/uL)   Hemoglobin 16.3 (*) 12.0 - 15.0 (g/dL)   HCT 16.1 (*) 09.6 - 46.0 (%)   MCV 96.3  78.0 - 100.0 (fL)   MCH 32.0  26.0 - 34.0 (pg)   MCHC 33.3  30.0 - 36.0 (g/dL)   RDW 04.5  40.9 - 81.1 (%)   Platelets 187  150 - 400 (K/uL)  DIFFERENTIAL      Component Value Range   Neutrophils Relative 82 (*) 43 - 77 (%)   Neutro Abs 5.3  1.7 - 7.7 (K/uL)   Lymphocytes Relative 10 (*) 12 - 46 (%)   Lymphs Abs 0.6 (*) 0.7 - 4.0 (K/uL)   Monocytes Relative 8  3 - 12 (%)   Monocytes Absolute 0.5  0.1 - 1.0 (K/uL)   Eosinophils Relative 0  0 - 5 (%)   Eosinophils Absolute 0.0  0.0 - 0.7 (K/uL)   Basophils Relative 0  0 - 1 (%)   Basophils Absolute 0.0  0.0 - 0.1 (K/uL)  COMPREHENSIVE METABOLIC PANEL      Component Value Range   Sodium 143  135 - 145 (mEq/L)   Potassium 3.6  3.5 - 5.1 (mEq/L)   Chloride 103  96 - 112  (mEq/L)   CO2 28  19 - 32 (mEq/L)   Glucose, Bld 83  70 - 99 (mg/dL)   BUN 16  6 - 23 (mg/dL)   Creatinine, Ser 9.14  0.50 - 1.10 (mg/dL)  Calcium 9.2  8.4 - 10.5 (mg/dL)   Total Protein 7.4  6.0 - 8.3 (g/dL)   Albumin 4.4  3.5 - 5.2 (g/dL)   AST 51 (*) 0 - 37 (U/L)   ALT 34  0 - 35 (U/L)   Alkaline Phosphatase 78  39 - 117 (U/L)   Total Bilirubin 0.2 (*) 0.3 - 1.2 (mg/dL)   GFR calc non Af Amer 53 (*) >90 (mL/min)   GFR calc Af Amer 62 (*) >90 (mL/min)  URINALYSIS, ROUTINE W REFLEX MICROSCOPIC      Component Value Range   Color, Urine YELLOW  YELLOW    APPearance TURBID (*) CLEAR    Specific Gravity, Urine 1.030  1.005 - 1.030    pH 5.5  5.0 - 8.0    Glucose, UA NEGATIVE  NEGATIVE (mg/dL)   Hgb urine dipstick LARGE (*) NEGATIVE    Bilirubin Urine MODERATE (*) NEGATIVE    Ketones, ur 15 (*) NEGATIVE (mg/dL)   Protein, ur 161 (*) NEGATIVE (mg/dL)   Urobilinogen, UA 1.0  0.0 - 1.0 (mg/dL)   Nitrite NEGATIVE  NEGATIVE    Leukocytes, UA SMALL (*) NEGATIVE   CARDIAC PANEL(CRET KIN+CKTOT+MB+TROPI)      Component Value Range   Total CK 723 (*) 7 - 177 (U/L)   CK, MB 5.0 (*) 0.3 - 4.0 (ng/mL)   Troponin I <0.30  <0.30 (ng/mL)   Relative Index 0.7  0.0 - 2.5   URINE MICROSCOPIC-ADD ON      Component Value Range   Squamous Epithelial / LPF RARE  RARE    WBC, UA 0-2  <3 (WBC/hpf)   RBC / HPF 3-6  <3 (RBC/hpf)   Bacteria, UA RARE  RARE    Crystals CA OXALATE CRYSTALS (*) NEGATIVE    Urine-Other MUCOUS PRESENT     Dg Chest 2 View    09/19/2011  *RADIOLOGY REPORT*  Clinical Data: Syncopal episode today.  CHEST - 2 VIEW  Comparison: None.  Findings: Moderate hyperinflation suggesting COPD.  Normal heart size.  No infiltrates or failure.  No bony abnormality.  No effusion or pneumothorax.  IMPRESSION: COPD, no active disease.  Original Report Authenticated By: Elsie Stain, M.D.    4:54 PM Lab workup was reassuringly negative.  Will get orthostatic vital signs.  Plan to discharge  with Rx for z-pak and Tussionex.     4:54 PM Pt is mildly orthostatic.  Will release.  Advised to force fluids.   1. Vasovagal syncope   2. Bronchitis             Carleene Cooper III, MD 09/19/11 1655

## 2011-09-19 NOTE — Telephone Encounter (Signed)
Patient's husband called and stated that the patient has been sick x3 days with congestion and stomach bug.  This morning when she was standing in the kitchen she began to shake violently in her arms and passed out.  I talked with the Dr. Reece Agar CMA and since no one had any openings we  instructed her to go to the ER.  That this could be a neurological problem.

## 2011-09-19 NOTE — ED Notes (Signed)
Pt here with witnessed syncopal episode by husband and was eased to floor; pt sts some shaking or arms and head immediately before event; pt with LOC lasting approx 15 seconds and was alert when awoke; pt with congestion and dry cough lately and some chest heaviness intermittant x several weeks; pt sts pain in left rib area from coughing; pt alert at present

## 2011-09-19 NOTE — Telephone Encounter (Signed)
Agree with ER eval for syncope workup.

## 2011-09-20 LAB — URINE CULTURE
Colony Count: 50000
Culture  Setup Time: 201212061538

## 2011-09-27 ENCOUNTER — Encounter: Payer: BC Managed Care – PPO | Admitting: Cardiology

## 2011-10-02 ENCOUNTER — Encounter: Payer: Self-pay | Admitting: Family Medicine

## 2011-10-02 ENCOUNTER — Ambulatory Visit (INDEPENDENT_AMBULATORY_CARE_PROVIDER_SITE_OTHER): Payer: BC Managed Care – PPO | Admitting: Family Medicine

## 2011-10-02 VITALS — BP 110/80 | HR 84 | Temp 98.2°F | Wt 128.5 lb

## 2011-10-02 DIAGNOSIS — I10 Essential (primary) hypertension: Secondary | ICD-10-CM

## 2011-10-02 DIAGNOSIS — J40 Bronchitis, not specified as acute or chronic: Secondary | ICD-10-CM

## 2011-10-02 DIAGNOSIS — R930 Abnormal findings on diagnostic imaging of skull and head, not elsewhere classified: Secondary | ICD-10-CM | POA: Insufficient documentation

## 2011-10-02 DIAGNOSIS — R0789 Other chest pain: Secondary | ICD-10-CM

## 2011-10-02 DIAGNOSIS — F17200 Nicotine dependence, unspecified, uncomplicated: Secondary | ICD-10-CM

## 2011-10-02 MED ORDER — HYDROCOD POLST-CHLORPHEN POLST 10-8 MG/5ML PO LQCR
5.0000 mL | Freq: Two times a day (BID) | ORAL | Status: DC
Start: 2011-10-02 — End: 2011-12-30

## 2011-10-02 NOTE — Progress Notes (Signed)
  Subjective:    Patient ID: Lindsay Mason, female    DOB: Oct 31, 1950, 60 y.o.   MRN: [id number]  HPI CC: chest congestion  Seen 09/19/2011 at ER with episode of syncope.  Records reviewed, dx vasovagal.  Happened while in kitchen, felt bad seconds prior to passing out then hands started shaking, then LOC for 15-30 sec per husband who was there to catch her.  Came to, no confusion afterwards.  Went to ER for evaluation.  Placed on zpack and tussionex by ER, continued mucinex.  Illness started early 09/2011.  Since then, continued chest congestion although improving.  Today feeling much better than yesterday.  Cough mildly productive of yellow sputum.  Intermittent chills on and off.  + SOB and heaviness worsened since bronchitis.  Feeling very tired/fatigued with this chest congestion.  Mild sinus pressure.  Long h/o smoking.  Also endorses pain and bruising left abd at border of ribcage, came on after coughing, now improving.  Improved head congestion.  No ST, ear pain, tooth pain, abd pain, n/v.  No AMS, confusion, slurred speech, paresthesias, numbness or weakness in arms or legs.  + sick contacts at home as well as husband.  + smokers at home (pt and husband).  Smoking improved but continued.  3-4 cig/day.  No h/o asthma.  No formal dx COPD.  Has stress test scheduled for 10/27/2010.  Endorsing intermittent heaviness in chest, worsened since recent bronchitis.  Feels fatigued with this.  Compliant with aspirin 81mg  daily.  Keeping track of BP, improved numbers off meds in last several weeks.  Abnormal MRI 08/08/2011 - Multiple abnormal foci of T2 and FLAIR signal affecting the deep  and subcortical hemispheric white matter. Most extensive area of  involvement is adjacent to the posterior body of the left lateral  ventricle. The differential diagnosis is chronic small vessel  ischemic change of the white matter versus demyelinating  disease/multiple sclerosis. None of the lesions shows  restricted  diffusion or contrast enhancement to suggest active nature.  Review of Systems Per HPI    Objective:   Physical Exam  Nursing note and vitals reviewed. Constitutional: She appears well-developed and well-nourished. No distress.  HENT:  Head: Normocephalic and atraumatic.  Right Ear: External ear normal.  Left Ear: External ear normal.  Nose: No mucosal edema or rhinorrhea. Right sinus exhibits no maxillary sinus tenderness and no frontal sinus tenderness. Left sinus exhibits no maxillary sinus tenderness and no frontal sinus tenderness.  Mouth/Throat: Uvula is midline, oropharynx is clear and moist and mucous membranes are normal. No oropharyngeal exudate.  Eyes: Conjunctivae and EOM are normal. Pupils are equal, round, and reactive to light. No scleral icterus.  Neck: Normal range of motion. Neck supple.  Cardiovascular: Normal rate, regular rhythm, normal heart sounds and intact distal pulses.   No murmur heard. Pulmonary/Chest: Effort normal and breath sounds normal. No respiratory distress. She has no wheezes. She has no rales.       No left sided ribcage pain to palpation.  Musculoskeletal: She exhibits no edema.  Lymphadenopathy:    She has no cervical adenopathy.  Skin: Skin is warm and dry. No rash noted.       Linear bruise left lower ribcage on skin, nontender.       Assessment & Plan:

## 2011-10-02 NOTE — Assessment & Plan Note (Signed)
Anticipate contributing to several of sxs today. Currently resolving.  Did not place on further antibiotics. Refilled tussionex. Update me if not improving as expected. Anticipate component of COPD, however lungs clear today.

## 2011-10-02 NOTE — Assessment & Plan Note (Addendum)
Anticipate more chronic small vessel ischemic changes as no sxs of demyelinating condition. Pt knows to watch for paresthesias, numbness, or weakness unilateral and will notify me for referral to neurology. Compliant with baby aspirin.  BP stable.

## 2011-10-02 NOTE — Assessment & Plan Note (Signed)
BP improved off meds. Continue to monitor.

## 2011-10-02 NOTE — Patient Instructions (Signed)
Call your insurace about the shingles shot to see if it is covered or how much it would cost and where is cheaper (here or pharmacy).  If you want to receive here, call for nurse visit. I think bronchitis is resolving. Return to see me after stress test (january or feruary) Pass by Marion's office to see if we can do stress test sooner (early January ideally). Good to see you today, let us know if any quesitons.

## 2011-10-02 NOTE — Assessment & Plan Note (Signed)
Anticipate current resolving bronchitis contributing however will see if able to move forward scheduled stress test.

## 2011-10-02 NOTE — Assessment & Plan Note (Signed)
Continue to strongly encourage cessation. Contemplative today.

## 2011-10-15 HISTORY — PX: OTHER SURGICAL HISTORY: SHX169

## 2011-10-22 ENCOUNTER — Encounter: Payer: Self-pay | Admitting: Cardiovascular Disease

## 2011-10-22 ENCOUNTER — Ambulatory Visit (INDEPENDENT_AMBULATORY_CARE_PROVIDER_SITE_OTHER): Payer: BC Managed Care – PPO | Admitting: Cardiovascular Disease

## 2011-10-22 VITALS — BP 122/80 | HR 96 | Ht 63.0 in | Wt 129.0 lb

## 2011-10-22 DIAGNOSIS — R0789 Other chest pain: Secondary | ICD-10-CM

## 2011-10-22 NOTE — Patient Instructions (Signed)
Treadmill study is normal. Good exercise tolerance. No further cardiac workup needed at this time.

## 2011-10-22 NOTE — Progress Notes (Signed)
Exercise Treadmill Test  Treadmill ordered for recent epsiodes of syncope.  Resting EKG shows NSR with rate of 96 bpm, no Significant ST or T wave changes Resting blood pressure of 122/80. Stand bruce protocal was used.  Patient exercised for 7 min 16 sec,  Peak heart rate of 157 bpm.  This was 90% of the maximum predicted heart rate (target heart rate 160). Achieved 10.1 METS No symptoms of chest pain or lightheadedness were reported at peak stress or in recovery.  Peak Blood pressure recorded was 180/94. Heart rate at 3 minutes in recovery was 99 bpm. No significant ST abnormality concerning for ischemia. Maximum depression  noted was -0.8 mm upsloping Frequent PVCs noted during exercise but did improve at peak exercise  FINAL IMPRESSION: Normal exercise stress test. No significant EKG changes concerning for ischemia. PVCs noted with exercise. She was asymptomatic. Excellent exercise tolerance.

## 2011-10-28 ENCOUNTER — Encounter: Payer: BC Managed Care – PPO | Admitting: Cardiology

## 2011-12-13 DIAGNOSIS — E278 Other specified disorders of adrenal gland: Secondary | ICD-10-CM

## 2011-12-13 DIAGNOSIS — J189 Pneumonia, unspecified organism: Secondary | ICD-10-CM

## 2011-12-13 HISTORY — DX: Other specified disorders of adrenal gland: E27.8

## 2011-12-13 HISTORY — DX: Pneumonia, unspecified organism: J18.9

## 2011-12-30 ENCOUNTER — Other Ambulatory Visit: Payer: Self-pay

## 2011-12-30 ENCOUNTER — Emergency Department (HOSPITAL_COMMUNITY): Payer: BC Managed Care – PPO

## 2011-12-30 ENCOUNTER — Inpatient Hospital Stay (HOSPITAL_COMMUNITY)
Admission: EM | Admit: 2011-12-30 | Discharge: 2012-01-01 | DRG: 090 | Disposition: A | Discharge status: Home / Self Care | Payer: BC Managed Care – PPO | Attending: Internal Medicine | Admitting: Internal Medicine

## 2011-12-30 ENCOUNTER — Encounter (HOSPITAL_COMMUNITY): Payer: Self-pay | Admitting: *Deleted

## 2011-12-30 DIAGNOSIS — I10 Essential (primary) hypertension: Secondary | ICD-10-CM | POA: Diagnosis present

## 2011-12-30 DIAGNOSIS — Z87891 Personal history of nicotine dependence: Secondary | ICD-10-CM | POA: Diagnosis present

## 2011-12-30 DIAGNOSIS — Z23 Encounter for immunization: Secondary | ICD-10-CM

## 2011-12-30 DIAGNOSIS — R51 Headache: Secondary | ICD-10-CM

## 2011-12-30 DIAGNOSIS — Z7982 Long term (current) use of aspirin: Secondary | ICD-10-CM

## 2011-12-30 DIAGNOSIS — M810 Age-related osteoporosis without current pathological fracture: Secondary | ICD-10-CM | POA: Diagnosis present

## 2011-12-30 DIAGNOSIS — J189 Pneumonia, unspecified organism: Principal | ICD-10-CM | POA: Diagnosis present

## 2011-12-30 DIAGNOSIS — Z79899 Other long term (current) drug therapy: Secondary | ICD-10-CM

## 2011-12-30 DIAGNOSIS — M8580 Other specified disorders of bone density and structure, unspecified site: Secondary | ICD-10-CM | POA: Diagnosis present

## 2011-12-30 DIAGNOSIS — R0789 Other chest pain: Secondary | ICD-10-CM

## 2011-12-30 DIAGNOSIS — A419 Sepsis, unspecified organism: Secondary | ICD-10-CM

## 2011-12-30 DIAGNOSIS — F17200 Nicotine dependence, unspecified, uncomplicated: Secondary | ICD-10-CM

## 2011-12-30 DIAGNOSIS — R930 Abnormal findings on diagnostic imaging of skull and head, not elsewhere classified: Secondary | ICD-10-CM

## 2011-12-30 DIAGNOSIS — J40 Bronchitis, not specified as acute or chronic: Secondary | ICD-10-CM | POA: Diagnosis present

## 2011-12-30 LAB — CBC
MCH: 32.4 pg (ref 26.0–34.0)
MCHC: 34.9 g/dL (ref 30.0–36.0)
Platelets: 210 10*3/uL (ref 150–400)
RBC: 4.97 MIL/uL (ref 3.87–5.11)

## 2011-12-30 LAB — DIFFERENTIAL
Basophils Relative: 0 % (ref 0–1)
Eosinophils Absolute: 0 10*3/uL (ref 0.0–0.7)
Neutro Abs: 21.1 10*3/uL — ABNORMAL HIGH (ref 1.7–7.7)
Neutrophils Relative %: 93 % — ABNORMAL HIGH (ref 43–77)

## 2011-12-30 LAB — POCT I-STAT, CHEM 8
BUN: 20 mg/dL (ref 6–23)
Chloride: 104 mEq/L (ref 96–112)
Glucose, Bld: 116 mg/dL — ABNORMAL HIGH (ref 70–99)
HCT: 49 % — ABNORMAL HIGH (ref 36.0–46.0)
Hemoglobin: 16.7 g/dL — ABNORMAL HIGH (ref 12.0–15.0)
TCO2: 27 mmol/L (ref 0–100)

## 2011-12-30 LAB — CARDIAC PANEL(CRET KIN+CKTOT+MB+TROPI): Total CK: 53 U/L (ref 7–177)

## 2011-12-30 MED ORDER — DEXTROSE 5 % IV SOLN
500.0000 mg | Freq: Once | INTRAVENOUS | Status: AC
  Administered 2011-12-31: 500 mg via INTRAVENOUS
  Filled 2011-12-30: qty 500

## 2011-12-30 MED ORDER — SODIUM CHLORIDE 0.9 % IV SOLN
Freq: Once | INTRAVENOUS | Status: AC
  Administered 2011-12-30: 22:00:00 via INTRAVENOUS

## 2011-12-30 MED ORDER — ONDANSETRON HCL 4 MG/2ML IJ SOLN
4.0000 mg | Freq: Once | INTRAMUSCULAR | Status: AC
  Administered 2011-12-30: 4 mg via INTRAVENOUS
  Filled 2011-12-30: qty 2

## 2011-12-30 MED ORDER — IOHEXOL 350 MG/ML SOLN
100.0000 mL | Freq: Once | INTRAVENOUS | Status: AC | PRN
  Administered 2011-12-30: 100 mL via INTRAVENOUS

## 2011-12-30 MED ORDER — MOXIFLOXACIN HCL IN NACL 400 MG/250ML IV SOLN
400.0000 mg | Freq: Once | INTRAVENOUS | Status: AC
  Administered 2011-12-30: 400 mg via INTRAVENOUS
  Filled 2011-12-30: qty 250

## 2011-12-30 MED ORDER — DEXTROSE 5 % IV SOLN
1.0000 g | Freq: Once | INTRAVENOUS | Status: AC
  Administered 2011-12-31: 1 g via INTRAVENOUS
  Filled 2011-12-30: qty 10

## 2011-12-30 NOTE — ED Notes (Signed)
To ed for eval of cp . States she was recently dx with bronchitis and given meds. No difficulty sleeping at night. Skin w/d, resp e/u. States she was diaphoretic.

## 2011-12-30 NOTE — ED Provider Notes (Signed)
History     CSN: [id number]  Arrival date & time 12/30/11  2020   First MD Initiated Contact with Patient 12/30/11 2031      Chief Complaint  Patient presents with  . Chest Pain    (Consider location/radiation/quality/duration/timing/severity/associated sxs/prior treatment) HPI Comments: Patient had bronchitis in January, which resolved about one week ago.  She started coughing again and having some discomfort in her left rib area that when she coughs.  She says, "it feels like something moves" Saturday she developed some chest pressure on the left side intermittently.  Episodes have increased in number and duration since then.  She recently traveled by car for 5 hour trip and returned today.  Of note, her oxygen saturation is 92%.  She denies fever  Patient is a 61 y.o. female presenting with chest pain. The history is provided by the patient.  Chest Pain The chest pain began 3 - 5 days ago. Chest pain occurs intermittently. The chest pain is worsening. The pain is associated with breathing and coughing. At its most intense, the pain is at 3/10. The pain is currently at 3/10. The severity of the pain is moderate. The quality of the pain is described as pressure-like. The pain does not radiate. Chest pain is worsened by deep breathing. Primary symptoms include shortness of breath and cough. Pertinent negatives for primary symptoms include no fever, no palpitations, no nausea and no dizziness.  Pertinent negatives for associated symptoms include no weakness. She tried nothing for the symptoms.     Past Medical History  Diagnosis Date  . Osteoporosis, unspecified   . Tobacco abuse   . Chest pain     normal ETT 10/2011    Past Surgical History  Procedure Date  . Vein removal     removed from legs  . Dexa 10/2008    Osteoporosis  . Tonsillectomy and adenoidectomy   . Exploratory laparotomy   . Mri head 07/2011    multiple foci deep and subcortical white matter, chronic ischemic vs  demyelinating, no active disease  . Ett 10/2011    WNL, no evidence of ischemia, excellent exercise tolerance    Family History  Problem Relation Age of Onset  . Osteoporosis      GM  . Stroke Maternal Grandmother   . Coronary artery disease Maternal Grandfather 50  . Diabetes Maternal Grandfather   . Coronary artery disease Paternal Grandmother 54  . Coronary artery disease Paternal Grandfather 63  . Cancer Neg Hx     History  Substance Use Topics  . Smoking status: Current Everyday Smoker    Types: Cigarettes  . Smokeless tobacco: Not on file   Comment: 4-5 cig/day  . Alcohol Use: Yes     social    OB History    Grav Para Term Preterm Abortions TAB SAB Ect Mult Living                  Review of Systems  Constitutional: Negative for fever and chills.  HENT: Negative for congestion.   Respiratory: Positive for cough and shortness of breath.   Cardiovascular: Positive for chest pain. Negative for palpitations and leg swelling.  Gastrointestinal: Negative for nausea.  Skin: Negative for pallor.  Neurological: Negative for dizziness, weakness and light-headedness.    Allergies  Penicillins  Home Medications   Current Outpatient Rx  Name Route Sig Dispense Refill  . ALENDRONATE SODIUM 70 MG PO TABS Oral Take 70 mg by mouth every 7 (seven)  days. On Mondays    . ASPIRIN EC 81 MG PO TBEC Oral Take 81 mg by mouth daily.      Marland Kitchen CALCIUM CARBONATE-VITAMIN D 600-200 MG-UNIT PO TABS Oral Take 1 tablet by mouth 2 (two) times daily.      Marland Kitchen ONE-DAILY MULTI VITAMINS PO TABS Oral Take 1 tablet by mouth daily.      Marland Kitchen VITAMIN B-12 100 MCG PO TABS Oral Take 100 mcg by mouth daily.        BP 110/62  Pulse 97  Temp(Src) 98.6 F (37 C) (Oral)  Resp 20  SpO2 95%  Physical Exam  Constitutional: She is oriented to person, place, and time. She appears well-developed and well-nourished.  Neck: Normal range of motion.  Cardiovascular: Tachycardia present.   Pulmonary/Chest: No  respiratory distress. She has no wheezes. She exhibits no tenderness.  Abdominal: Soft. She exhibits no distension.  Musculoskeletal: She exhibits no edema.  Neurological: She is alert and oriented to person, place, and time.  Skin: Skin is warm and dry.    ED Course  Procedures (including critical care time)  Labs Reviewed  CBC - Abnormal; Notable for the following:    WBC 22.7 (*)    Hemoglobin 16.1 (*)    HCT 46.1 (*)    All other components within normal limits  DIFFERENTIAL - Abnormal; Notable for the following:    Neutrophils Relative 93 (*)    Neutro Abs 21.1 (*)    Lymphocytes Relative 3 (*)    All other components within normal limits  D-DIMER, QUANTITATIVE - Abnormal; Notable for the following:    D-Dimer, Quant 2.12 (*)    All other components within normal limits  POCT I-STAT, CHEM 8 - Abnormal; Notable for the following:    Glucose, Bld 116 (*)    Hemoglobin 16.7 (*)    HCT 49.0 (*)    All other components within normal limits  CARDIAC PANEL(CRET KIN+CKTOT+MB+TROPI)   Dg Chest 2 View  12/30/2011  *RADIOLOGY REPORT*  Clinical Data: Shortness of breath.  Heaviness in the chest.  CHEST - 2 VIEW  Comparison: 09/19/2011  Findings: Considerable airspace opacity/consolidation noted in the left lower lobe, suspicious for pneumonia.  Underlying emphysema is suspected.  Cardiac and mediastinal contours appear unremarkable.  Dextroconvex lumbar scoliosis noted with rotary component.  IMPRESSION:  1.  Consolidation/pneumonia in the left lower lobe.  Follow-up radiography is recommended to ensure clearance and exclude underlying pathologic process. 2.  Suspected emphysema. 3.  Lumbar scoliosis.  Original Report Authenticated By: Dellia Cloud, M.D.   Ct Angio Chest W/cm &/or Wo Cm  12/30/2011  *RADIOLOGY REPORT*  Clinical Data: Chest pain and shortness of breath; elevated D- dimer.  CT ANGIOGRAPHY CHEST  Technique:  Multidetector CT imaging of the chest using the standard  protocol during bolus administration of intravenous contrast. Multiplanar reconstructed images including MIPs were obtained and reviewed to evaluate the vascular anatomy.  Contrast: OMNIPAQUE IOHEXOL 350 MG/ML IV SOLN  Comparison: Chest radiograph performed earlier today at 08:50 p.m.  Findings: There is no evidence of pulmonary embolus.  There is dense airspace opacification involving much of the left lower lobe, compatible with pneumonia.  Mild dependent atelectasis is noted at the right lower lobe.  A few blebs are noted at the left lung apex.  There is no evidence of pleural effusion or pneumothorax.  No masses are identified; no abnormal focal contrast enhancement is seen.  Scattered small mediastinal nodes remain normal in  size; no mediastinal lymphadenopathy is seen. The mediastinum is unremarkable in appearance.  No pericardial effusion is identified. The great vessels are within normal limits.  No axillary lymphadenopathy is seen.  The visualized portions of the thyroid gland are unremarkable in appearance.  The visualized portions of the liver and spleen are unremarkable. Vague decreased attenuation within the expected location of the adrenal glands bilaterally may reflect adrenal hyperplasia.  The visualized portions of the gallbladder are grossly unremarkable.  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  No acute osseous abnormalities are seen.  IMPRESSION:  1.  No evidence of pulmonary embolus. 2.  Dense left lower lobe pneumonia noted. 3.  Mild dependent atelectasis at the right lower lobe. 4.  Few blebs at the left lung apex. 5.  Question of adrenal hyperplasia. 6.  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  Original Report Authenticated By: Tonia Ghent, M.D.     1. Sepsis   2. CAP (community acquired pneumonia)    ED ECG REPORT   Date: 12/31/2011  EKG Time: 1:24 AM  Rate: 109  Rhythm: sinus tachycardia,  unchanged from  previous tracings  Axis: normal  Intervals:none  ST&T Change: multiple PVC  Narrative Interpretation:abnormal but unchanged              MDM  Patient symptoms are concerning for pneumonia versus a PE due to tachycardiac and chest pressure.  Both would be evaluated with labs.  Chest x-ray, EKG        Arman Filter, NP 12/31/11 0124  Arman Filter, NP 12/31/11 4098

## 2011-12-30 NOTE — ED Notes (Signed)
61 year old female who presents with increased shortness of breath cough and left-sided chest pain over the last 24 hours. According to the patient and her spouse she has been coughing for the better part of the last month, but last night it became much worse, started having pain in the chest. She denies fever but was coughing up some blood. Symptoms are persistent, gradually getting worse, not associated with trauma, swelling in the legs or recent surgery. She states that nothing makes this better, the chest pain and coughing get worse when she lays down. She denies fevers   Review of systems:  Positive for chest pain, cough, shortness of breath. Negative for fevers chills nausea vomiting diarrhea swelling rashes headache sore throat. All other systems reviewed and are negative  Physical exam:  Decreased lung sounds at the left base, mild tachypnea at 22 breaths per minute, hypoxia to 92% on room air, speaks in full sentences, tachycardic to 110, no murmurs, abdomen soft, no edema in the lower extremities, conjunctiva clear, mucous membranes moist, skin without rashes, mental status appropriate, alert and follows commands  Assessment:  Patient appears to have a clinical pneumonia, chest x-ray reveals a dense lobar infiltrate, CT scan confirms infiltrates and rules out pulmonary embolism or other significant mass such as carcinoma.  Antibiotics ordered, hospitalist paged for admission given patient's high white blood cell count at 22,000, tachycardia and presence of pneumonia on x-ray. This is consistent with early sepsis, no signs of shock at this time. Aggressive antibiotic therapy, fluids, admission for observation.  CRITICAL CARE Performed by: Vida Roller   Total critical care time: 35  Critical care time was exclusive of separately billable procedures and treating other patients.  Critical care was necessary to treat or prevent imminent or life-threatening deterioration.  Critical care was  time spent personally by me on the following activities: development of treatment plan with patient and/or surrogate as well as nursing, discussions with consultants, evaluation of patient's response to treatment, examination of patient, obtaining history from patient or surrogate, ordering and performing treatments and interventions, ordering and review of laboratory studies, ordering and review of radiographic studies, pulse oximetry and re-evaluation of patient's condition.   Vida Roller, MD 01/01/12 [phone]

## 2011-12-31 ENCOUNTER — Telehealth: Payer: Self-pay | Admitting: Family Medicine

## 2011-12-31 ENCOUNTER — Encounter (HOSPITAL_COMMUNITY): Payer: Self-pay | Admitting: Internal Medicine

## 2011-12-31 DIAGNOSIS — J189 Pneumonia, unspecified organism: Secondary | ICD-10-CM | POA: Diagnosis present

## 2011-12-31 LAB — CBC
HCT: 41.8 % (ref 36.0–46.0)
MCHC: 33.7 g/dL (ref 30.0–36.0)
MCV: 92.7 fL (ref 78.0–100.0)
Platelets: 207 10*3/uL (ref 150–400)
RDW: 14.1 % (ref 11.5–15.5)
WBC: 16.9 10*3/uL — ABNORMAL HIGH (ref 4.0–10.5)

## 2011-12-31 LAB — BASIC METABOLIC PANEL
BUN: 16 mg/dL (ref 6–23)
Calcium: 8.6 mg/dL (ref 8.4–10.5)
Chloride: 104 mEq/L (ref 96–112)
Creatinine, Ser: 0.84 mg/dL (ref 0.50–1.10)
GFR calc Af Amer: 86 mL/min — ABNORMAL LOW (ref >90)

## 2011-12-31 MED ORDER — HYDROMORPHONE HCL PF 1 MG/ML IJ SOLN: 1.0000 mg | INTRAMUSCULAR | Status: DC | PRN

## 2011-12-31 MED ORDER — ALENDRONATE SODIUM 70 MG PO TABS: 70.0000 mg | ORAL_TABLET | ORAL | Status: DC

## 2011-12-31 MED ORDER — CALCIUM CARBONATE-VITAMIN D 500-200 MG-UNIT PO TABS
1.0000 | ORAL_TABLET | Freq: Two times a day (BID) | ORAL | Status: DC
  Administered 2011-12-31 – 2012-01-01 (×3): 1 via ORAL
  Filled 2011-12-31 (×5): qty 1

## 2011-12-31 MED ORDER — ONDANSETRON HCL 4 MG/2ML IJ SOLN
4.0000 mg | Freq: Once | INTRAMUSCULAR | Status: AC
  Administered 2011-12-31: 4 mg via INTRAVENOUS
  Filled 2011-12-31: qty 2

## 2011-12-31 MED ORDER — ADULT MULTIVITAMIN W/MINERALS CH
1.0000 | ORAL_TABLET | Freq: Every day | ORAL | Status: DC
  Administered 2011-12-31 – 2012-01-01 (×2): 1 via ORAL
  Filled 2011-12-31 (×2): qty 1

## 2011-12-31 MED ORDER — DEXTROSE-NACL 5-0.9 % IV SOLN: INTRAVENOUS | Status: DC

## 2011-12-31 MED ORDER — SODIUM CHLORIDE 0.9 % IV SOLN: INTRAVENOUS | Status: DC

## 2011-12-31 MED ORDER — ASPIRIN EC 81 MG PO TBEC
81.0000 mg | DELAYED_RELEASE_TABLET | Freq: Every day | ORAL | Status: DC
  Administered 2011-12-31 – 2012-01-01 (×2): 81 mg via ORAL
  Filled 2011-12-31 (×2): qty 1

## 2011-12-31 MED ORDER — ENOXAPARIN SODIUM 40 MG/0.4ML ~~LOC~~ SOLN
40.0000 mg | SUBCUTANEOUS | Status: DC
  Administered 2011-12-31: 40 mg via SUBCUTANEOUS
  Filled 2011-12-31 (×2): qty 0.4

## 2011-12-31 MED ORDER — DEXTROSE-NACL 5-0.9 % IV SOLN
INTRAVENOUS | Status: DC
  Administered 2011-12-31: 02:00:00 via INTRAVENOUS

## 2011-12-31 MED ORDER — SODIUM CHLORIDE 0.9 % IJ SOLN
3.0000 mL | Freq: Two times a day (BID) | INTRAMUSCULAR | Status: DC
  Administered 2011-12-31 – 2012-01-01 (×2): 3 mL via INTRAVENOUS

## 2011-12-31 MED ORDER — ONDANSETRON HCL 4 MG/2ML IJ SOLN
4.0000 mg | Freq: Four times a day (QID) | INTRAMUSCULAR | Status: DC | PRN
  Administered 2011-12-31: 4 mg via INTRAVENOUS
  Filled 2011-12-31 (×2): qty 2

## 2011-12-31 MED ORDER — VITAMIN B-12 100 MCG PO TABS
100.0000 ug | ORAL_TABLET | Freq: Every day | ORAL | Status: DC
  Administered 2011-12-31 – 2012-01-01 (×2): 100 ug via ORAL
  Filled 2011-12-31 (×2): qty 1

## 2011-12-31 MED ORDER — DEXTROSE 5 % IV SOLN
1.0000 g | INTRAVENOUS | Status: DC
  Administered 2011-12-31: 1 g via INTRAVENOUS
  Filled 2011-12-31 (×2): qty 10

## 2011-12-31 MED ORDER — DEXTROSE 5 % IV SOLN
500.0000 mg | INTRAVENOUS | Status: DC
  Administered 2012-01-01: 500 mg via INTRAVENOUS
  Filled 2011-12-31: qty 500

## 2011-12-31 NOTE — Telephone Encounter (Signed)
Noted. Saw pt today.

## 2011-12-31 NOTE — Progress Notes (Signed)
Utilization review completed.  

## 2011-12-31 NOTE — Telephone Encounter (Signed)
Pt's husband called and wanted to let you know that Mrs. Lindsay Mason went to the hospital and has Pnuemonia.

## 2011-12-31 NOTE — Plan of Care (Signed)
Problem: Phase I Progression Outcomes Goal: Initial discharge plan identified Outcome: Completed/Met Date Met:  12/31/11 To return home with husband

## 2011-12-31 NOTE — ED Provider Notes (Signed)
Medical screening examination/treatment/procedure(s) were conducted as a shared visit with non-physician practitioner(s) and myself.  I personally evaluated the patient during the encounter.  Patient has left anterior lateral chest pain with hemoptysis. CT AngioJet chest shows no pulmonary embolus. However, a dense left lower lobe pneumonia is noted. Patient will be admitted for IV antibiotics.  Donnetta Hutching, MD 12/31/11 1610

## 2011-12-31 NOTE — Progress Notes (Signed)
PATIENT DETAILS Name: Lindsay Mason Age: 61 y.o. Sex: female Date of Birth: [birth year] Admit Date: 12/30/2011 [id number] Sharen Hones, MD, MD  Subjective: Much better, pain in the left chest area better. Cough apparently is better as well.  Objective: Vital signs in last 24 hours: Filed Vitals:   12/30/11 2024 12/30/11 2333 12/31/11 0205 12/31/11 0609  BP: 115/70 110/62 111/69 99/62  Pulse: 120 97 94 82  Temp: 98.6 F (37 C)  98.2 F (36.8 C) 98 F (36.7 C)  TempSrc: Oral  Oral Oral  Resp: 16 20 18 18   Height:   5' 3.6" (1.615 m)   Weight:   57.5 kg (126 lb 12.2 oz)   SpO2: 92% 95% 96% 97%    Weight change:   Body mass index is 22.03 kg/(m^2).  Intake/Output from previous day:  Intake/Output Summary (Last 24 hours) at 12/31/11 1139 Last data filed at 12/31/11 0900  Gross per 24 hour  Intake    603 ml  Output      0 ml  Net    603 ml    PHYSICAL EXAM: Gen Exam: Awake and alert with clear speech.   Neck: Supple, No JVD.   Chest: B/L Clear.   CVS: S1 S2 Regular, no murmurs.  Abdomen: soft, BS +, non tender, non distended.  Extremities: no edema, lower extremities warm to touch. Neurologic: Non Focal.   Skin: No Rash.   Wounds: N/A.    CONSULTS:  None  LAB RESULTS: CBC  Lab 12/31/11 0515 12/30/11 2136 12/30/11 2109  WBC 16.9* -- 22.7*  HGB 14.1 16.7* 16.1*  HCT 41.8 49.0* 46.1*  PLT 207 -- 210  MCV 92.7 -- 92.8  MCH 31.3 -- 32.4  MCHC 33.7 -- 34.9  RDW 14.1 -- 14.1  LYMPHSABS -- -- 0.7  MONOABS -- -- 0.8  EOSABS -- -- 0.0  BASOSABS -- -- 0.0  BANDABS -- -- --    Chemistries   Lab 12/31/11 0515 12/30/11 2136  NA 139 139  K 4.4 4.8  CL 104 104  CO2 27 --  GLUCOSE 98 116*  BUN 16 20  CREATININE 0.84 1.00  CALCIUM 8.6 --  MG -- --    GFR Estimated Creatinine Clearance: 60.5 ml/min (by C-G formula based on Cr of 0.84).  Coagulation profile No results found for this basename: INR:5,PROTIME:5 in the last 168 hours  Cardiac  Enzymes  Lab 12/30/11 2109  CKMB 1.4  TROPONINI <0.30  MYOGLOBIN --    No components found with this basename: POCBNP:3  Basename 12/30/11 2109  DDIMER 2.12*   No results found for this basename: HGBA1C:2 in the last 72 hours No results found for this basename: CHOL:2,HDL:2,LDLCALC:2,TRIG:2,CHOLHDL:2,LDLDIRECT:2 in the last 72 hours No results found for this basename: TSH,T4TOTAL,FREET3,T3FREE,THYROIDAB in the last 72 hours No results found for this basename: VITAMINB12:2,FOLATE:2,FERRITIN:2,TIBC:2,IRON:2,RETICCTPCT:2 in the last 72 hours No results found for this basename: LIPASE:2,AMYLASE:2 in the last 72 hours  Urine Studies No results found for this basename: UACOL:2,UAPR:2,USPG:2,UPH:2,UTP:2,UGL:2,UKET:2,UBIL:2,UHGB:2,UNIT:2,UROB:2,ULEU:2,UEPI:2,UWBC:2,URBC:2,UBAC:2,CAST:2,CRYS:2,UCOM:2,BILUA:2 in the last 72 hours  MICROBIOLOGY: No results found for this or any previous visit (from the past 240 hour(s)).  RADIOLOGY STUDIES/RESULTS: Dg Chest 2 View  12/30/2011  *RADIOLOGY REPORT*  Clinical Data: Shortness of breath.  Heaviness in the chest.  CHEST - 2 VIEW  Comparison: 09/19/2011  Findings: Considerable airspace opacity/consolidation noted in the left lower lobe, suspicious for pneumonia.  Underlying emphysema is suspected.  Cardiac and mediastinal contours appear unremarkable.  Dextroconvex lumbar scoliosis noted with rotary  component.  IMPRESSION:  1.  Consolidation/pneumonia in the left lower lobe.  Follow-up radiography is recommended to ensure clearance and exclude underlying pathologic process. 2.  Suspected emphysema. 3.  Lumbar scoliosis.  Original Report Authenticated By: Dellia Cloud, M.D.   Ct Angio Chest W/cm &/or Wo Cm  12/30/2011  *RADIOLOGY REPORT*  Clinical Data: Chest pain and shortness of breath; elevated D- dimer.  CT ANGIOGRAPHY CHEST  Technique:  Multidetector CT imaging of the chest using the standard protocol during bolus administration of intravenous  contrast. Multiplanar reconstructed images including MIPs were obtained and reviewed to evaluate the vascular anatomy.  Contrast: OMNIPAQUE IOHEXOL 350 MG/ML IV SOLN  Comparison: Chest radiograph performed earlier today at 08:50 p.m.  Findings: There is no evidence of pulmonary embolus.  There is dense airspace opacification involving much of the left lower lobe, compatible with pneumonia.  Mild dependent atelectasis is noted at the right lower lobe.  A few blebs are noted at the left lung apex.  There is no evidence of pleural effusion or pneumothorax.  No masses are identified; no abnormal focal contrast enhancement is seen.  Scattered small mediastinal nodes remain normal in size; no mediastinal lymphadenopathy is seen. The mediastinum is unremarkable in appearance.  No pericardial effusion is identified. The great vessels are within normal limits.  No axillary lymphadenopathy is seen.  The visualized portions of the thyroid gland are unremarkable in appearance.  The visualized portions of the liver and spleen are unremarkable. Vague decreased attenuation within the expected location of the adrenal glands bilaterally may reflect adrenal hyperplasia.  The visualized portions of the gallbladder are grossly unremarkable.  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  No acute osseous abnormalities are seen.  IMPRESSION:  1.  No evidence of pulmonary embolus. 2.  Dense left lower lobe pneumonia noted. 3.  Mild dependent atelectasis at the right lower lobe. 4.  Few blebs at the left lung apex. 5.  Question of adrenal hyperplasia. 6.  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  Original Report Authenticated By: Tonia Ghent, M.D.    MEDICATIONS: Scheduled Meds:   . sodium chloride   Intravenous Once  . aspirin EC  81 mg Oral Daily  . azithromycin (ZITHROMAX) 500 MG IVPB  500 mg Intravenous Once  . azithromycin  500 mg Intravenous Q24H  .  calcium-vitamin D  1 tablet Oral BID WC  . cefTRIAXone (ROCEPHIN)  IV  1 g Intravenous Once  . cefTRIAXone (ROCEPHIN)  IV  1 g Intravenous Q24H  . moxifloxacin  400 mg Intravenous Once  . mulitivitamin with minerals  1 tablet Oral Daily  . ondansetron  4 mg Intravenous Once  . ondansetron (ZOFRAN) IV  4 mg Intravenous Once  . sodium chloride  3 mL Intravenous Q12H  . vitamin B-12  100 mcg Oral Daily  . DISCONTD: sodium chloride   Intravenous STAT  . DISCONTD: alendronate  70 mg Oral Q7 days   Continuous Infusions:   . dextrose 5 % and 0.9% NaCl 100 mL/hr at 12/31/11 0605   PRN Meds:.HYDROmorphone, iohexol  Antibiotics: Anti-infectives     Start     Dose/Rate Route Frequency Ordered Stop   01/01/12 0000   azithromycin (ZITHROMAX) 500 mg in dextrose 5 % 250 mL IVPB        500 mg 250 mL/hr over 60 Minutes Intravenous Every 24 hours 12/31/11 0156     12/31/11  2200   cefTRIAXone (ROCEPHIN) 1 g in dextrose 5 % 50 mL IVPB        1 g 100 mL/hr over 30 Minutes Intravenous Every 24 hours 12/31/11 0156     12/31/11 0000   cefTRIAXone (ROCEPHIN) 1 g in dextrose 5 % 50 mL IVPB        1 g 100 mL/hr over 30 Minutes Intravenous  Once 12/30/11 2348 12/31/11 0039   12/31/11 0000   azithromycin (ZITHROMAX) 500 mg in dextrose 5 % 250 mL IVPB        500 mg 250 mL/hr over 60 Minutes Intravenous  Once 12/30/11 2348 12/31/11 0134   12/30/11 2115   moxifloxacin (AVELOX) IVPB 400 mg        400 mg 250 mL/hr over 60 Minutes Intravenous  Once 12/30/11 2110 12/30/11 2250          Assessment/Plan: Patient Active Hospital Problem List: PNA (pneumonia)  -Likely community-acquired pneumonia -Continue with Rocephin and Zithromax for another 24 hours before transitioning to oral antibiotics and subsequent discharge tomorrow. -Afebrile and nontoxic looking. Leukocytosis is done pending.  TOBACCO ABUSE -Have counseled extensively.  OSTEOPOROSIS  -Resume alendronate on discharge.  HTN  (hypertension)  -Currently controlled without any need for antihypertensives.  Disposition: Home likely tomorrow.  DVT Prophylaxis: Prophylactic Lovenox.  Code Status:  Maretta Bees,  MD. 12/31/2011, 11:39 AM

## 2011-12-31 NOTE — Progress Notes (Signed)
   CARE MANAGEMENT NOTE 12/31/2011  Patient:  Lindsay Mason, Lindsay Mason   Account Number:  [id number]  Date Initiated:  12/31/2011  Documentation initiated by:  Letha Cape  Subjective/Objective Assessment:   dx pna  admit- lives with spouse.  pta independent.     Action/Plan:   Anticipated DC Date:  01/01/2012   Anticipated DC Plan:  HOME/SELF CARE      DC Planning Services  CM consult      Choice offered to / List presented to:             Status of service:  In process, will continue to follow Medicare Important Message given?   (If response is "NO", the following Medicare IM given date fields will be blank) Date Medicare IM given:   Date Additional Medicare IM given:    Discharge Disposition:    Per UR Regulation:    If discussed at Long Length of Stay Meetings, dates discussed:    Comments:  12/31/11 11:30 Letha Cape RN, BSN [phone] patient lives with spouse, pta independent. Patient has medication coverage and transportation.

## 2011-12-31 NOTE — H&P (Signed)
PCP:   Eustaquio Boyden, MD, MD   Chief Complaint: Chest pain with coughing.  HPI: Lindsay Mason is an 61 y.o. female with history of osteoporosis, active tobacco use, with no history of coronary disease, presents to emergency room complaining of right-sided chest pain with coughing. This has been going on for less than a week. When she called her primary care physician she was sent in for evaluation. She denied any distant travel or any ill contact. Evaluation in the emergency room included a elevated d-dimer to 2.12 with negative CT pulmonary angiogram for any evidence of PE, but did reveal a dense left lower lobe infiltrate as shown in her chest x-ray. She also has marked leukocytosis with a white count 22, 700, and negative cardiac markers. The electrolytes and renal function were normal, but she did have slight elevation of her blood glucose. She denied any abdominal cramps or pain nausea, vomiting, but had subjective fever and some chills at home. She was originally given Avelox, but subsequently switched over to Rocephin and Zithromax, and hospitalist was asked to admit her for community-acquired pneumonia.  Rewiew of Systems:  The patient denies anorexia, fever, weight loss,, vision loss, decreased hearing, hoarseness, , syncope, dyspnea on exertion, peripheral edema, balance deficits, hemoptysis, abdominal pain, melena, hematochezia, severe indigestion/heartburn, hematuria, incontinence, genital sores, muscle weakness, suspicious skin lesions, transient blindness, difficulty walking, depression, unusual weight change, abnormal bleeding, enlarged lymph nodes, angioedema, and breast masses.   Past Medical History  Diagnosis Date  . Osteoporosis, unspecified   . Tobacco abuse   . Chest pain     normal ETT 10/2011    Past Surgical History  Procedure Date  . Vein removal     removed from legs  . Dexa 10/2008    Osteoporosis  . Tonsillectomy and adenoidectomy   . Exploratory  laparotomy   . Mri head 07/2011    multiple foci deep and subcortical white matter, chronic ischemic vs demyelinating, no active disease  . Ett 10/2011    WNL, no evidence of ischemia, excellent exercise tolerance    Medications:  HOME MEDS: Prior to Admission medications   Medication Sig Start Date End Date Taking? Authorizing Provider  alendronate (FOSAMAX) 70 MG tablet Take 70 mg by mouth every 7 (seven) days. On Mondays 07/25/11 07/24/12 Yes Eustaquio Boyden, MD  aspirin EC 81 MG tablet Take 81 mg by mouth daily.     Yes Historical Provider, MD  Calcium Carbonate-Vitamin D (CALCIUM 600+D) 600-200 MG-UNIT TABS Take 1 tablet by mouth 2 (two) times daily.     Yes Historical Provider, MD  Multiple Vitamin (MULTIVITAMIN) tablet Take 1 tablet by mouth daily.     Yes Historical Provider, MD  vitamin B-12 (CYANOCOBALAMIN) 100 MCG tablet Take 100 mcg by mouth daily.     Yes Historical Provider, MD     Allergies:  Allergies  Allergen Reactions  . Penicillins     REACTION: swelling tongue - States has taken Cephalosporins without difficulty in past    Social History:   reports that she has been smoking Cigarettes.  She does not have any smokeless tobacco history on file. She reports that she drinks alcohol. She reports that she does not use illicit drugs. she worked as a Copywriter, advertising for [employer]. She is married with children.  Family History: Family History  Problem Relation Age of Onset  . Osteoporosis      GM  . Stroke Maternal Grandmother   . Coronary artery disease  Maternal Grandfather 50  . Diabetes Maternal Grandfather   . Coronary artery disease Paternal Grandmother 63  . Coronary artery disease Paternal Grandfather 66  . Cancer Neg Hx      Physical Exam: Filed Vitals:   12/30/11 2024 12/30/11 2333 12/31/11 0205  BP: 115/70 110/62 111/69  Pulse: 120 97 94  Temp: 98.6 F (37 C)  98.2 F (36.8 C)  TempSrc: Oral  Oral  Resp: 16 20 18   Height:   5' 3.6" (1.615  m)  Weight:   57.5 kg (126 lb 12.2 oz)  SpO2: 92% 95% 96%   Blood pressure 111/69, pulse 94, temperature 98.2 F (36.8 C), temperature source Oral, resp. rate 18, height 5' 3.6" (1.615 m), weight 57.5 kg (126 lb 12.2 oz), SpO2 96.00%.  GEN:  Pleasant  person lying in the stretcher in no acute distress; cooperative with exam PSYCH:  alert and oriented x4; does not appear anxious does not appear depressed; affect is normal HEENT: Mucous membranes pink and anicteric; PERRLA; EOM intact; no cervical lymphadenopathy nor thyromegaly or carotid bruit; no JVD; Breasts:: Not examined CHEST WALL: No tenderness CHEST: Normal respiration, she has E. to A changes of her left base with some crackles. There is no wheezes HEART: Regular rate and rhythm; no murmurs rubs or gallops BACK: No kyphosis or scoliosis; no CVA tenderness ABDOMEN: Obese, soft non-tender; no masses, no organomegaly, normal abdominal bowel sounds; no pannus; no intertriginous candida. Rectal Exam: Not done EXTREMITIES: No bone or joint deformity; age-appropriate arthropathy of the hands and knees; no edema; no ulcerations. Genitalia: not examined PULSES: 2+ and symmetric SKIN: Normal hydration no rash or ulceration CNS: Cranial nerves 2-12 grossly intact no focal neurologic deficit   Labs & Imaging Results for orders placed during the hospital encounter of 12/30/11 (from the past 48 hour(s))  CBC     Status: Abnormal   Collection Time   12/30/11  9:09 PM      Component Value Range Comment   WBC 22.7 (*) 4.0 - 10.5 (K/uL)    RBC 4.97  3.87 - 5.11 (MIL/uL)    Hemoglobin 16.1 (*) 12.0 - 15.0 (g/dL)    HCT 16.1 (*) 09.6 - 46.0 (%)    MCV 92.8  78.0 - 100.0 (fL)    MCH 32.4  26.0 - 34.0 (pg)    MCHC 34.9  30.0 - 36.0 (g/dL)    RDW 04.5  40.9 - 81.1 (%)    Platelets 210  150 - 400 (K/uL)   DIFFERENTIAL     Status: Abnormal   Collection Time   12/30/11  9:09 PM      Component Value Range Comment   Neutrophils Relative 93 (*) 43  - 77 (%)    Neutro Abs 21.1 (*) 1.7 - 7.7 (K/uL)    Lymphocytes Relative 3 (*) 12 - 46 (%)    Lymphs Abs 0.7  0.7 - 4.0 (K/uL)    Monocytes Relative 4  3 - 12 (%)    Monocytes Absolute 0.8  0.1 - 1.0 (K/uL)    Eosinophils Relative 0  0 - 5 (%)    Eosinophils Absolute 0.0  0.0 - 0.7 (K/uL)    Basophils Relative 0  0 - 1 (%)    Basophils Absolute 0.0  0.0 - 0.1 (K/uL)   CARDIAC PANEL(CRET KIN+CKTOT+MB+TROPI)     Status: Normal   Collection Time   12/30/11  9:09 PM      Component Value Range Comment  Total CK 53  7 - 177 (U/L)    CK, MB 1.4  0.3 - 4.0 (ng/mL)    Troponin I <0.30  <0.30 (ng/mL)    Relative Index RELATIVE INDEX IS INVALID  0.0 - 2.5    D-DIMER, QUANTITATIVE     Status: Abnormal   Collection Time   12/30/11  9:09 PM      Component Value Range Comment   D-Dimer, Quant 2.12 (*) 0.00 - 0.48 (ug/mL-FEU)   POCT I-STAT, CHEM 8     Status: Abnormal   Collection Time   12/30/11  9:36 PM      Component Value Range Comment   Sodium 139  135 - 145 (mEq/L)    Potassium 4.8  3.5 - 5.1 (mEq/L)    Chloride 104  96 - 112 (mEq/L)    BUN 20  6 - 23 (mg/dL)    Creatinine, Ser 4.09  0.50 - 1.10 (mg/dL)    Glucose, Bld 811 (*) 70 - 99 (mg/dL)    Calcium, Ion 9.14  1.12 - 1.32 (mmol/L)    TCO2 27  0 - 100 (mmol/L)    Hemoglobin 16.7 (*) 12.0 - 15.0 (g/dL)    HCT 78.2 (*) 95.6 - 46.0 (%)    Dg Chest 2 View  12/30/2011  *RADIOLOGY REPORT*  Clinical Data: Shortness of breath.  Heaviness in the chest.  CHEST - 2 VIEW  Comparison: 09/19/2011  Findings: Considerable airspace opacity/consolidation noted in the left lower lobe, suspicious for pneumonia.  Underlying emphysema is suspected.  Cardiac and mediastinal contours appear unremarkable.  Dextroconvex lumbar scoliosis noted with rotary component.  IMPRESSION:  1.  Consolidation/pneumonia in the left lower lobe.  Follow-up radiography is recommended to ensure clearance and exclude underlying pathologic process. 2.  Suspected emphysema. 3.   Lumbar scoliosis.  Original Report Authenticated By: Dellia Cloud, M.D.   Ct Angio Chest W/cm &/or Wo Cm  12/30/2011  *RADIOLOGY REPORT*  Clinical Data: Chest pain and shortness of breath; elevated D- dimer.  CT ANGIOGRAPHY CHEST  Technique:  Multidetector CT imaging of the chest using the standard protocol during bolus administration of intravenous contrast. Multiplanar reconstructed images including MIPs were obtained and reviewed to evaluate the vascular anatomy.  Contrast: OMNIPAQUE IOHEXOL 350 MG/ML IV SOLN  Comparison: Chest radiograph performed earlier today at 08:50 p.m.  Findings: There is no evidence of pulmonary embolus.  There is dense airspace opacification involving much of the left lower lobe, compatible with pneumonia.  Mild dependent atelectasis is noted at the right lower lobe.  A few blebs are noted at the left lung apex.  There is no evidence of pleural effusion or pneumothorax.  No masses are identified; no abnormal focal contrast enhancement is seen.  Scattered small mediastinal nodes remain normal in size; no mediastinal lymphadenopathy is seen. The mediastinum is unremarkable in appearance.  No pericardial effusion is identified. The great vessels are within normal limits.  No axillary lymphadenopathy is seen.  The visualized portions of the thyroid gland are unremarkable in appearance.  The visualized portions of the liver and spleen are unremarkable. Vague decreased attenuation within the expected location of the adrenal glands bilaterally may reflect adrenal hyperplasia.  The visualized portions of the gallbladder are grossly unremarkable.  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  No acute osseous abnormalities are seen.  IMPRESSION:  1.  No evidence of pulmonary embolus. 2.  Dense left lower lobe pneumonia noted.  3.  Mild dependent atelectasis at the right lower lobe. 4.  Few blebs at the left lung apex. 5.  Question of adrenal  hyperplasia. 6.  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  Original Report Authenticated By: Tonia Ghent, M.D.      Assessment Present on Admission:  .Bronchitis .HTN (hypertension) .OSTEOPOROSIS .TOBACCO ABUSE   PLAN: She is admitted for community-acquired pneumonia and we will continue Rocephin and Zithromax. She is quite stable. We'll give her supplemental O2 as well. Continue her medication for hypertension. We'll give her some pain medication. If she continued to cough significantly, we'll give her antitussive as well. This is to prevent her cracking her ribs especially when she has osteoporosis. I strongly recommend that she quit tobacco use indefinitely. Will admit her to [facility] hospitalist team 6  Other plans as per orders.   Jeret Goyer 12/31/2011, 5:30 AM

## 2012-01-01 ENCOUNTER — Encounter: Payer: Self-pay | Admitting: Family Medicine

## 2012-01-01 LAB — CBC
MCH: 31.1 pg (ref 26.0–34.0)
MCV: 93.7 fL (ref 78.0–100.0)
Platelets: 238 10*3/uL (ref 150–400)
RBC: 4.27 MIL/uL (ref 3.87–5.11)
RDW: 14.2 % (ref 11.5–15.5)
WBC: 8.3 10*3/uL (ref 4.0–10.5)

## 2012-01-01 LAB — BASIC METABOLIC PANEL
Calcium: 8.2 mg/dL — ABNORMAL LOW (ref 8.4–10.5)
Creatinine, Ser: 0.82 mg/dL (ref 0.50–1.10)
GFR calc Af Amer: 88 mL/min — ABNORMAL LOW (ref >90)
GFR calc non Af Amer: 76 mL/min — ABNORMAL LOW (ref >90)
Sodium: 142 mEq/L (ref 135–145)

## 2012-01-01 MED ORDER — PNEUMOCOCCAL VAC POLYVALENT 25 MCG/0.5ML IJ INJ
0.5000 mL | INJECTION | Freq: Once | INTRAMUSCULAR | Status: AC
  Administered 2012-01-01: 0.5 mL via INTRAMUSCULAR
  Filled 2012-01-01: qty 0.5

## 2012-01-01 MED ORDER — LEVOFLOXACIN 500 MG PO TABS: 500.0000 mg | ORAL_TABLET | Freq: Every day | ORAL | Status: AC

## 2012-01-01 NOTE — Discharge Summary (Signed)
Patient ID: Oriyah Lamphear MRN: [id number] DOB/AGE: 61-29-1952 61 y.o. Primary Care Physician:Javier Sharen Hones, MD, MD Admit date: 12/30/2011 Discharge date: 01/01/2012    Discharge Diagnoses:   Principal Problem:  *PNA (pneumonia) Active Problems:  TOBACCO ABUSE  OSTEOPOROSIS  HTN (hypertension)  Bronchitis   Medication List  As of 01/01/2012 10:54 AM   ASK your doctor about these medications         alendronate 70 MG tablet   Commonly known as: FOSAMAX      aspirin EC 81 MG tablet      Calcium 600+D 600-200 MG-UNIT Tabs   Generic drug: Calcium Carbonate-Vitamin D      multivitamin tablet      vitamin B-12 100 MCG tablet   Commonly known as: CYANOCOBALAMIN            Discharged Condition:good    Consults:none  Significant Diagnostic Studies: Dg Chest 2 View  12/30/2011  *RADIOLOGY REPORT*  Clinical Data: Shortness of breath.  Heaviness in the chest.  CHEST - 2 VIEW  Comparison: 09/19/2011  Findings: Considerable airspace opacity/consolidation noted in the left lower lobe, suspicious for pneumonia.  Underlying emphysema is suspected.  Cardiac and mediastinal contours appear unremarkable.  Dextroconvex lumbar scoliosis noted with rotary component.  IMPRESSION:  1.  Consolidation/pneumonia in the left lower lobe.  Follow-up radiography is recommended to ensure clearance and exclude underlying pathologic process. 2.  Suspected emphysema. 3.  Lumbar scoliosis.  Original Report Authenticated By: Dellia Cloud, M.D.   Ct Angio Chest W/cm &/or Wo Cm  12/30/2011  *RADIOLOGY REPORT*  Clinical Data: Chest pain and shortness of breath; elevated D- dimer.  CT ANGIOGRAPHY CHEST  Technique:  Multidetector CT imaging of the chest using the standard protocol during bolus administration of intravenous contrast. Multiplanar reconstructed images including MIPs were obtained and reviewed to evaluate the vascular anatomy.  Contrast: OMNIPAQUE IOHEXOL 350 MG/ML IV SOLN   Comparison: Chest radiograph performed earlier today at 08:50 p.m.  Findings: There is no evidence of pulmonary embolus.  There is dense airspace opacification involving much of the left lower lobe, compatible with pneumonia.  Mild dependent atelectasis is noted at the right lower lobe.  A few blebs are noted at the left lung apex.  There is no evidence of pleural effusion or pneumothorax.  No masses are identified; no abnormal focal contrast enhancement is seen.  Scattered small mediastinal nodes remain normal in size; no mediastinal lymphadenopathy is seen. The mediastinum is unremarkable in appearance.  No pericardial effusion is identified. The great vessels are within normal limits.  No axillary lymphadenopathy is seen.  The visualized portions of the thyroid gland are unremarkable in appearance.  The visualized portions of the liver and spleen are unremarkable. Vague decreased attenuation within the expected location of the adrenal glands bilaterally may reflect adrenal hyperplasia.  The visualized portions of the gallbladder are grossly unremarkable.  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  No acute osseous abnormalities are seen.  IMPRESSION:  1.  No evidence of pulmonary embolus. 2.  Dense left lower lobe pneumonia noted. 3.  Mild dependent atelectasis at the right lower lobe. 4.  Few blebs at the left lung apex. 5.  Question of adrenal hyperplasia. 6.  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  Original Report Authenticated By: Tonia Ghent, M.D.    Lab Results: Results for orders placed during the hospital encounter of 12/30/11 (from the  past 48 hour(s))  CBC     Status: Abnormal   Collection Time   12/30/11  9:09 PM      Component Value Range Comment   WBC 22.7 (*) 4.0 - 10.5 (K/uL)    RBC 4.97  3.87 - 5.11 (MIL/uL)    Hemoglobin 16.1 (*) 12.0 - 15.0 (g/dL)    HCT 04.5 (*) 40.9 - 46.0 (%)    MCV 92.8  78.0 - 100.0 (fL)    MCH  32.4  26.0 - 34.0 (pg)    MCHC 34.9  30.0 - 36.0 (g/dL)    RDW 81.1  91.4 - 78.2 (%)    Platelets 210  150 - 400 (K/uL)   DIFFERENTIAL     Status: Abnormal   Collection Time   12/30/11  9:09 PM      Component Value Range Comment   Neutrophils Relative 93 (*) 43 - 77 (%)    Neutro Abs 21.1 (*) 1.7 - 7.7 (K/uL)    Lymphocytes Relative 3 (*) 12 - 46 (%)    Lymphs Abs 0.7  0.7 - 4.0 (K/uL)    Monocytes Relative 4  3 - 12 (%)    Monocytes Absolute 0.8  0.1 - 1.0 (K/uL)    Eosinophils Relative 0  0 - 5 (%)    Eosinophils Absolute 0.0  0.0 - 0.7 (K/uL)    Basophils Relative 0  0 - 1 (%)    Basophils Absolute 0.0  0.0 - 0.1 (K/uL)   CARDIAC PANEL(CRET KIN+CKTOT+MB+TROPI)     Status: Normal   Collection Time   12/30/11  9:09 PM      Component Value Range Comment   Total CK 53  7 - 177 (U/L)    CK, MB 1.4  0.3 - 4.0 (ng/mL)    Troponin I <0.30  <0.30 (ng/mL)    Relative Index RELATIVE INDEX IS INVALID  0.0 - 2.5    D-DIMER, QUANTITATIVE     Status: Abnormal   Collection Time   12/30/11  9:09 PM      Component Value Range Comment   D-Dimer, Quant 2.12 (*) 0.00 - 0.48 (ug/mL-FEU)   POCT I-STAT, CHEM 8     Status: Abnormal   Collection Time   12/30/11  9:36 PM      Component Value Range Comment   Sodium 139  135 - 145 (mEq/L)    Potassium 4.8  3.5 - 5.1 (mEq/L)    Chloride 104  96 - 112 (mEq/L)    BUN 20  6 - 23 (mg/dL)    Creatinine, Ser 9.56  0.50 - 1.10 (mg/dL)    Glucose, Bld 213 (*) 70 - 99 (mg/dL)    Calcium, Ion 0.86  1.12 - 1.32 (mmol/L)    TCO2 27  0 - 100 (mmol/L)    Hemoglobin 16.7 (*) 12.0 - 15.0 (g/dL)    HCT 57.8 (*) 46.9 - 46.0 (%)   BASIC METABOLIC PANEL     Status: Abnormal   Collection Time   12/31/11  5:15 AM      Component Value Range Comment   Sodium 139  135 - 145 (mEq/L)    Potassium 4.4  3.5 - 5.1 (mEq/L)    Chloride 104  96 - 112 (mEq/L)    CO2 27  19 - 32 (mEq/L)    Glucose, Bld 98  70 - 99 (mg/dL)    BUN 16  6 - 23 (mg/dL)    Creatinine, Ser 6.29  0.50  - 1.10 (mg/dL)    Calcium 8.6  8.4 - 10.5 (mg/dL)    GFR calc non Af Amer 74 (*) >90 (mL/min)    GFR calc Af Amer 86 (*) >90 (mL/min)   CBC     Status: Abnormal   Collection Time   12/31/11  5:15 AM      Component Value Range Comment   WBC 16.9 (*) 4.0 - 10.5 (K/uL)    RBC 4.51  3.87 - 5.11 (MIL/uL)    Hemoglobin 14.1  12.0 - 15.0 (g/dL)    HCT 16.1  09.6 - 04.5 (%)    MCV 92.7  78.0 - 100.0 (fL)    MCH 31.3  26.0 - 34.0 (pg)    MCHC 33.7  30.0 - 36.0 (g/dL)    RDW 40.9  81.1 - 91.4 (%)    Platelets 207  150 - 400 (K/uL)   TSH     Status: Normal   Collection Time   12/31/11  5:15 AM      Component Value Range Comment   TSH 1.372  0.350 - 4.500 (uIU/mL)   CBC     Status: Normal   Collection Time   01/01/12  5:11 AM      Component Value Range Comment   WBC 8.3  4.0 - 10.5 (K/uL)    RBC 4.27  3.87 - 5.11 (MIL/uL)    Hemoglobin 13.3  12.0 - 15.0 (g/dL)    HCT 78.2  95.6 - 21.3 (%)    MCV 93.7  78.0 - 100.0 (fL)    MCH 31.1  26.0 - 34.0 (pg)    MCHC 33.3  30.0 - 36.0 (g/dL)    RDW 08.6  57.8 - 46.9 (%)    Platelets 238  150 - 400 (K/uL)   BASIC METABOLIC PANEL     Status: Abnormal   Collection Time   01/01/12  5:11 AM      Component Value Range Comment   Sodium 142  135 - 145 (mEq/L)    Potassium 4.2  3.5 - 5.1 (mEq/L)    Chloride 110  96 - 112 (mEq/L)    CO2 26  19 - 32 (mEq/L)    Glucose, Bld 93  70 - 99 (mg/dL)    BUN 14  6 - 23 (mg/dL)    Creatinine, Ser 6.29  0.50 - 1.10 (mg/dL)    Calcium 8.2 (*) 8.4 - 10.5 (mg/dL)    GFR calc non Af Amer 76 (*) >90 (mL/min)    GFR calc Af Amer 88 (*) >90 (mL/min)    No results found for this or any previous visit (from the past 240 hour(s)).   Hospital Course:  61 year old woman presented to the hospital with dyspnea and productive sputum. She was diagnosed with community-acquired pneumonia was started on  intravenous Rocephin and azithromycin. She had a course of progressive improvement decreased dyspnea and she was transitioned to  oral antibiotics by the time of discharge.  Discharge Exam: Blood pressure 117/79, pulse 78, temperature 98.4 F (36.9 C), temperature source Oral, resp. rate 20, height 5' 3.6" (1.615 m), weight 57.5 kg (126 lb 12.2 oz), SpO2 97.00%. Alert and oriented x3 Chest clear to auscultation without wheeze rhonchi crackles  Disposition: Home    Follow-up Information    Follow up with Eustaquio Boyden, MD .         Signed: Lonia Blood 01/01/2012, 10:54 AM

## 2012-01-01 NOTE — Progress Notes (Signed)
01/01/12 NSG 1225 Patient discharged to home with family, discharged instructions given and reviewed with patient.  Patient verbalized understanding, care notes given for new meds and pertinent education. Skin intact at discharge, IV discharged and intact. Patient escorted to car via wheelchair by volunteer service.  Forbes Cellar, RN

## 2012-05-21 ENCOUNTER — Other Ambulatory Visit: Payer: Self-pay | Admitting: Family Medicine

## 2013-09-29 ENCOUNTER — Telehealth: Payer: Self-pay

## 2013-09-29 NOTE — Telephone Encounter (Signed)
Ok to come in for shingles shot.  She's also due for physical - plz schedule this at her convenience.

## 2013-09-29 NOTE — Telephone Encounter (Signed)
Pt last seen 10/02/2011; pt's insurance co will cover shingles vaccine at doctors office. Is it OK to schedule shingles vaccine?

## 2013-09-30 NOTE — Telephone Encounter (Signed)
Nurse visit scheduled. Patient will schedule CPE at the beginning of the year after her insurance changes.

## 2013-10-13 ENCOUNTER — Ambulatory Visit (INDEPENDENT_AMBULATORY_CARE_PROVIDER_SITE_OTHER): Payer: BC Managed Care – PPO

## 2013-10-13 DIAGNOSIS — Z2911 Encounter for prophylactic immunotherapy for respiratory syncytial virus (RSV): Secondary | ICD-10-CM

## 2013-10-13 DIAGNOSIS — Z23 Encounter for immunization: Secondary | ICD-10-CM

## 2014-02-11 ENCOUNTER — Encounter: Payer: Self-pay | Admitting: Family Medicine

## 2014-02-11 ENCOUNTER — Ambulatory Visit (INDEPENDENT_AMBULATORY_CARE_PROVIDER_SITE_OTHER): Payer: No Typology Code available for payment source | Admitting: Family Medicine

## 2014-02-11 VITALS — BP 136/84 | HR 88 | Temp 98.1°F | Wt 129.2 lb

## 2014-02-11 DIAGNOSIS — M810 Age-related osteoporosis without current pathological fracture: Secondary | ICD-10-CM

## 2014-02-11 DIAGNOSIS — F17200 Nicotine dependence, unspecified, uncomplicated: Secondary | ICD-10-CM

## 2014-02-11 DIAGNOSIS — Z1211 Encounter for screening for malignant neoplasm of colon: Secondary | ICD-10-CM

## 2014-02-11 HISTORY — PX: OTHER SURGICAL HISTORY: SHX169

## 2014-02-11 MED ORDER — BUPROPION HCL 75 MG PO TABS: 75.0000 mg | ORAL_TABLET | Freq: Two times a day (BID) | ORAL | Status: DC

## 2014-02-11 MED ORDER — ALENDRONATE SODIUM 70 MG PO TABS: ORAL_TABLET | ORAL | Status: DC

## 2014-02-11 NOTE — Assessment & Plan Note (Signed)
Down to minimal use.  Prescribed wellbutrin which she has used in past - start at 75mg  bid.

## 2014-02-11 NOTE — Assessment & Plan Note (Signed)
Pt compliant with cal/vit D but does not do well with dietary sources. Reviewed osteoporosis treatment options including side effects. 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 Pt has been off fosamax for last 3 months.  Refilled today, will restart. DEXA 2010 with T score -3.3 on spine.  Will reorder dexa today.

## 2014-02-11 NOTE — Progress Notes (Signed)
Pre visit review using our clinic review tool, if applicable. No additional management support is needed unless otherwise documented below in the visit note. 

## 2014-02-11 NOTE — Progress Notes (Signed)
BP 136/84  Pulse 88  Temp(Src) 98.1 F (36.7 C) (Oral)  Wt 129 lb 4 oz (58.627 kg)   CC: med refill  Subjective:    Patient ID: Lindsay Mason, female    DOB: [birth year], 63 y.o.   MRN: [id number]  HPI: Rosalba Totty is a 63 y.o. female presenting on 02/11/2014 for Medication Refill   Shaquana has not been seen here since 09/2011.  Presents today for med refill visit.  Osteoporosis - dexa 2010.  On fosamax for 5+ years.  Compliant with cal/vit D daily.  Infrequent milk and cal fortified OJ.  Does weight bearing exercise but could do better.  Smoking - down to 4 cig/wk.  Uses e cigarette as well as lozenges and patches.  Interested in wellbutrin.  chantix helped but would like to try wellbutrin.  Wt Readings from Last 3 Encounters:  02/11/14 129 lb 4 oz (58.627 kg)  12/31/11 126 lb 12.2 oz (57.5 kg)  10/22/11 129 lb (58.514 kg)   Body mass index is 22.48 kg/(m^2).    Preventative: Well woman exam previously with Dr. Council Mechanic  Relevant past medical, surgical, family and social history reviewed and updated as indicated.  Allergies and medications reviewed and updated. Current Outpatient Prescriptions on File Prior to Visit  Medication Sig  . aspirin EC 81 MG tablet Take 81 mg by mouth daily.    . Calcium Carbonate-Vitamin D (CALCIUM 600+D) 600-200 MG-UNIT TABS Take 1 tablet by mouth 2 (two) times daily.    . Multiple Vitamin (MULTIVITAMIN) tablet Take 1 tablet by mouth daily.    . vitamin B-12 (CYANOCOBALAMIN) 100 MCG tablet Take 100 mcg by mouth daily.     No current facility-administered medications on file prior to visit.    Review of Systems Per HPI unless specifically indicated above    Objective:    BP 136/84  Pulse 88  Temp(Src) 98.1 F (36.7 C) (Oral)  Wt 129 lb 4 oz (58.627 kg)  Physical Exam  Nursing note and vitals reviewed. Constitutional: She appears well-developed and well-nourished. No distress.  HENT:  Mouth/Throat: Oropharynx is clear and  moist. No oropharyngeal exudate.  Neck: Normal range of motion. Neck supple. Carotid bruit is not present.  Cardiovascular: Normal rate, normal heart sounds and intact distal pulses.   No murmur heard. Pulmonary/Chest: Effort normal and breath sounds normal. No respiratory distress. She has no wheezes. She has no rales.  Musculoskeletal: She exhibits no edema.  Lymphadenopathy:    She has no cervical adenopathy.  Psychiatric: She has a normal mood and affect.       Assessment & Plan:  Advised needs to call to schedule mammogram. Sent home with iFOB today.  Problem List Items Addressed This Visit   TOBACCO ABUSE     Down to minimal use.  Prescribed wellbutrin which she has used in past - start at 75mg  bid.    OSTEOPOROSIS - Primary     Pt compliant with cal/vit D but does not do well with dietary sources. Reviewed osteoporosis treatment options including side effects. 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 Pt has been off fosamax for last 3 months.  Refilled today, will restart. DEXA 2010 with T score -3.3 on spine.  Will reorder dexa today.    Relevant Medications      alendronate (FOSAMAX) 70 MG tablet   Other Relevant Orders      DG Bone Density    Other Visit Diagnoses  Special screening for malignant neoplasms, colon        Relevant Orders       Fecal occult blood, imunochemical        Follow up plan: Return as needed, for follow up.

## 2014-02-11 NOTE — Patient Instructions (Addendum)
I've refilled fosamax and sent in wellbutrin to help with smoking cessation. Pass by Linda's office up front to schedule bone density scan. Good to see you today, call us with quesitons. Return as needed or in 1 year for physical.

## 2014-02-14 ENCOUNTER — Telehealth: Payer: Self-pay | Admitting: Family Medicine

## 2014-02-14 NOTE — Telephone Encounter (Signed)
Relevant patient education mailed to patient.  

## 2014-02-15 ENCOUNTER — Ambulatory Visit (INDEPENDENT_AMBULATORY_CARE_PROVIDER_SITE_OTHER)
Admission: RE | Admit: 2014-02-15 | Discharge: 2014-02-15 | Disposition: A | Discharge status: Home / Self Care | Payer: No Typology Code available for payment source | Source: Ambulatory Visit | Attending: Family Medicine | Admitting: Family Medicine

## 2014-02-15 DIAGNOSIS — M810 Age-related osteoporosis without current pathological fracture: Secondary | ICD-10-CM

## 2014-02-21 ENCOUNTER — Encounter: Payer: Self-pay | Admitting: *Deleted

## 2014-02-21 ENCOUNTER — Encounter: Payer: Self-pay | Admitting: Family Medicine

## 2014-03-21 ENCOUNTER — Other Ambulatory Visit: Payer: Self-pay

## 2014-03-21 DIAGNOSIS — Z1231 Encounter for screening mammogram for malignant neoplasm of breast: Secondary | ICD-10-CM

## 2014-03-28 ENCOUNTER — Ambulatory Visit
Admission: RE | Admit: 2014-03-28 | Discharge: 2014-03-28 | Disposition: A | Discharge status: Home / Self Care | Payer: No Typology Code available for payment source | Source: Ambulatory Visit

## 2014-03-28 ENCOUNTER — Encounter (INDEPENDENT_AMBULATORY_CARE_PROVIDER_SITE_OTHER): Payer: Self-pay

## 2014-03-28 DIAGNOSIS — Z1231 Encounter for screening mammogram for malignant neoplasm of breast: Secondary | ICD-10-CM

## 2014-03-29 ENCOUNTER — Encounter: Payer: Self-pay | Admitting: *Deleted

## 2014-03-29 LAB — HM MAMMOGRAPHY: HM Mammogram: NORMAL

## 2014-04-07 ENCOUNTER — Encounter: Payer: Self-pay | Admitting: Family Medicine

## 2014-04-07 ENCOUNTER — Ambulatory Visit (INDEPENDENT_AMBULATORY_CARE_PROVIDER_SITE_OTHER): Payer: No Typology Code available for payment source | Admitting: Family Medicine

## 2014-04-07 VITALS — BP 130/100 | HR 70 | Temp 98.3°F | Wt 128.5 lb

## 2014-04-07 DIAGNOSIS — S76212D Strain of adductor muscle, fascia and tendon of left thigh, subsequent encounter: Secondary | ICD-10-CM | POA: Insufficient documentation

## 2014-04-07 DIAGNOSIS — S76012A Strain of muscle, fascia and tendon of left hip, initial encounter: Secondary | ICD-10-CM

## 2014-04-07 DIAGNOSIS — IMO0002 Reserved for concepts with insufficient information to code with codable children: Secondary | ICD-10-CM

## 2014-04-07 MED ORDER — CYCLOBENZAPRINE HCL 10 MG PO TABS: 5.0000 mg | ORAL_TABLET | Freq: Two times a day (BID) | ORAL | Status: DC | PRN

## 2014-04-07 NOTE — Progress Notes (Signed)
   BP 130/100  Pulse 70  Temp(Src) 98.3 F (36.8 C) (Oral)  Wt 128 lb 8 oz (58.287 kg)  SpO2 97%   CC: pulled muscle?  Subjective:    Patient ID: Lindsay Mason, female    DOB: 03-19-1951, 63 y.o.   MRN: [id number]  HPI: Lindsay Mason is a 63 y.o. female presenting on 04/07/2014 for Pulled muscle   Early June thinks pulled groin muscle after working outside - cutting/dragging trees. Laying down worsens leg pain. Standing improves pain. Also has some left lower back pain. Did see bruising initially but that has resolved.  No fevers/chills, abd pain. Self treated with aleve 2 in am then 1 every 4 hours rest of day, heating pad at night time. May have been taking too much aleve because has felt mildly nauseated last 1-2 days.  Lab Results  Component Value Date   CREATININE 0.82 01/01/2012    Relevant past medical, surgical, family and social history reviewed and updated as indicated.  Allergies and medications reviewed and updated. Current Outpatient Prescriptions on File Prior to Visit  Medication Sig  . alendronate (FOSAMAX) 70 MG tablet take 1 tablet by mouth EVERY 7 DAYS, TAKE WITH A FULL GLASS OF WATER ON A EMPTY STOMACH  . aspirin EC 81 MG tablet Take 81 mg by mouth daily.    . B Complex-Biotin-FA (B-COMPLEX PO) Take 1 tablet by mouth daily.  Marland Kitchen buPROPion (WELLBUTRIN) 75 MG tablet Take 1 tablet (75 mg total) by mouth 2 (two) times daily.  . Calcium Carbonate-Vitamin D (CALCIUM 600+D) 600-200 MG-UNIT TABS Take 1 tablet by mouth 2 (two) times daily.    . Multiple Vitamin (MULTIVITAMIN) tablet Take 1 tablet by mouth daily.    . vitamin B-12 (CYANOCOBALAMIN) 100 MCG tablet Take 100 mcg by mouth daily.     No current facility-administered medications on file prior to visit.    Review of Systems Per HPI unless specifically indicated above    Objective:    BP 130/100  Pulse 70  Temp(Src) 98.3 F (36.8 C) (Oral)  Wt 128 lb 8 oz (58.287 kg)  SpO2 97%  Physical Exam    Nursing note and vitals reviewed. Constitutional: She appears well-developed and well-nourished. No distress.  Abdominal: Hernia confirmed negative in the right inguinal area and confirmed negative in the left inguinal area.  Musculoskeletal: She exhibits no edema.  No bruising Mild discomfort with testing of hip flexors/extenders Main tenderness with testing of hip abductors on left, also some tenderness with extension of hip. No pain with int/ext rotation at hip. Neg FABER. No pain at SIJ, GTB or sciatic notch bilaterally.  Neurological: No sensory deficit.  5/5 strength BLE   Results for orders placed in visit on 03/29/14  HM MAMMOGRAPHY      Result Value Ref Range   HM Mammogram Normal Birads1-Repeat 1 year        Assessment & Plan:   Problem List Items Addressed This Visit   Muscle strain of left gluteal region - Primary     Exam consistent with this. Treat with flexeril muscle relaxant. Avoid NSAID for next few days (likely overused recently), ok to take tylenol. Heating pad at night. Provided with exercises on gluteal strain from [facility] pt advisor. Update if not improving for further eval, consider SM referral. Pt agrees with plan.        Follow up plan: Return if symptoms worsen or fail to improve.

## 2014-04-07 NOTE — Patient Instructions (Addendum)
I think you pulled a groin muscle and gluteus muscle. Treat with heating had at night time along with flexeril 1/2 - 1 tablet as muscle relaxant. Exercises provided today. Hold aleve for now - take tylenol for discomfort 500-1000mg  twice daily as needed. Should get better with time. Let us know if not improving as expected for further evaluation.

## 2014-04-07 NOTE — Assessment & Plan Note (Signed)
Exam consistent with this. Treat with flexeril muscle relaxant. Avoid NSAID for next few days (likely overused recently), ok to take tylenol. Heating pad at night. Provided with exercises on gluteal strain from [facility] pt advisor. Update if not improving for further eval, consider SM referral. Pt agrees with plan.

## 2014-04-07 NOTE — Progress Notes (Signed)
Pre visit review using our clinic review tool, if applicable. No additional management support is needed unless otherwise documented below in the visit note. 

## 2014-04-19 ENCOUNTER — Other Ambulatory Visit: Payer: Self-pay | Admitting: Family Medicine

## 2014-04-19 ENCOUNTER — Other Ambulatory Visit: Payer: Self-pay

## 2014-04-19 MED ORDER — CYCLOBENZAPRINE HCL 10 MG PO TABS: 5.0000 mg | ORAL_TABLET | Freq: Two times a day (BID) | ORAL | Status: DC | PRN

## 2014-04-19 NOTE — Telephone Encounter (Signed)
Pt left v/m; pt was seen 04/07/14 and pt has been taking cyclobenzaprine; pain is better but still continues to hurt in groin and back and pt request either refill on cyclobenzaprine or appt to see Dr Darnell Level for f/u appt if Dr Darnell Level feels need to reck instead of refilling med. Pt request cb.

## 2014-04-19 NOTE — Telephone Encounter (Signed)
plz notify med refilled.

## 2014-04-20 NOTE — Telephone Encounter (Signed)
Patient notified

## 2014-04-29 ENCOUNTER — Telehealth: Payer: Self-pay

## 2014-04-29 ENCOUNTER — Ambulatory Visit (INDEPENDENT_AMBULATORY_CARE_PROVIDER_SITE_OTHER)
Admission: RE | Admit: 2014-04-29 | Discharge: 2014-04-29 | Disposition: A | Discharge status: Home / Self Care | Payer: No Typology Code available for payment source | Source: Ambulatory Visit | Attending: Family Medicine | Admitting: Family Medicine

## 2014-04-29 ENCOUNTER — Ambulatory Visit (INDEPENDENT_AMBULATORY_CARE_PROVIDER_SITE_OTHER): Payer: No Typology Code available for payment source | Admitting: Family Medicine

## 2014-04-29 ENCOUNTER — Encounter: Payer: Self-pay | Admitting: Family Medicine

## 2014-04-29 VITALS — BP 138/64 | HR 88 | Temp 98.1°F | Wt 128.8 lb

## 2014-04-29 DIAGNOSIS — R109 Unspecified abdominal pain: Secondary | ICD-10-CM

## 2014-04-29 DIAGNOSIS — R1032 Left lower quadrant pain: Secondary | ICD-10-CM

## 2014-04-29 MED ORDER — CYCLOBENZAPRINE HCL 10 MG PO TABS: 10.0000 mg | ORAL_TABLET | Freq: Three times a day (TID) | ORAL | Status: DC | PRN

## 2014-04-29 NOTE — Progress Notes (Signed)
BP 138/64  Pulse 88  Temp(Src) 98.1 F (36.7 C) (Oral)  Wt 128 lb 12 oz (58.401 kg)   CC: f/u left groin strain  Subjective:    Patient ID: Lindsay Mason, female    DOB: 12-17-1950, 63 y.o.   MRN: [id number]  HPI: Lindsay Mason is a 63 y.o. female presenting on 04/29/2014 for Follow-up   See prior note for details. Briefly, seen here 04/07/2014 with dx L gluteal strain after she injured herself early June after working outside - cutting/dragging trees. Treated with flexeril, tylenol, and exercises from [facility] pt advisor on gluteal strain. Has been regular with gluteal strain exercises twice to three times daily.  Taking flexeril 10mg  bid (no sedation) and tylenol 1000mg  at a time which both help. Flexeril has significantly helped but notices when med starts wearing off.  Groin pain persists. Has been more active recently. Stays active but at times pain limits activity.  Relevant past medical, surgical, family and social history reviewed and updated as indicated.  Allergies and medications reviewed and updated. Current Outpatient Prescriptions on File Prior to Visit  Medication Sig  . alendronate (FOSAMAX) 70 MG tablet take 1 tablet by mouth EVERY 7 DAYS, TAKE WITH A FULL GLASS OF WATER ON A EMPTY STOMACH  . aspirin EC 81 MG tablet Take 81 mg by mouth daily.    . B Complex-Biotin-FA (B-COMPLEX PO) Take 1 tablet by mouth daily.  . Calcium Carbonate-Vitamin D (CALCIUM 600+D) 600-200 MG-UNIT TABS Take 1 tablet by mouth 2 (two) times daily.    . Multiple Vitamin (MULTIVITAMIN) tablet Take 1 tablet by mouth daily.    . vitamin B-12 (CYANOCOBALAMIN) 100 MCG tablet Take 100 mcg by mouth daily.    Marland Kitchen buPROPion (WELLBUTRIN) 75 MG tablet Take 1 tablet (75 mg total) by mouth 2 (two) times daily.  . naproxen sodium (ANAPROX) 220 MG tablet Takes 1 tablet every 4 hours, started first part of June when hurt side.   No current facility-administered medications on file prior to visit.     Review of Systems Per HPI unless specifically indicated above    Objective:    BP 138/64  Pulse 88  Temp(Src) 98.1 F (36.7 C) (Oral)  Wt 128 lb 12 oz (58.401 kg)  Physical Exam  Nursing note and vitals reviewed. Constitutional: She appears well-developed and well-nourished. No distress.  Musculoskeletal: She exhibits no edema.  Tender to palpation at insertion of gracilis vs pectineus muscles. Discomfort with testing abductors against resistance.  No GTB, SIJ or sciatic notch discomfort.  Neg SLR bilaterallyl  Skin: Skin is warm and dry. No rash noted.  Psychiatric: She has a normal mood and affect.   Results for orders placed in visit on 03/29/14  HM MAMMOGRAPHY      Result Value Ref Range   HM Mammogram Normal Birads1-Repeat 1 year        Assessment & Plan:   Problem List Items Addressed This Visit   Left groin pain - Primary     Exam today more consistent with L groin strain but not improving as would be expected. Provided with groin strain exercises from [facility] pt advisor and rec continued flexeril 10mg  tid prn, may alternate tylenol and aleve prn. Given persistent discomfort without significant improvement noted over last 3 weeks, suggested referrral to SM - and I will have [name] call her on Monday to schedule appt. Xray of pelvis obtained today to eval for avulsion injury - ok on my read,  will await radiology eval. Pt agrees with plan    Relevant Orders      DG Pelvis 1-2 Views (Completed)       Follow up plan: Return if symptoms worsen or fail to improve.

## 2014-04-29 NOTE — Telephone Encounter (Signed)
Spoke with patient and she is coming in today.

## 2014-04-29 NOTE — Telephone Encounter (Signed)
Noted. plz schedule f/u with myself. Could add on today at 4:45pm or see me on Monday.

## 2014-04-29 NOTE — Progress Notes (Signed)
Pre visit review using our clinic review tool, if applicable. No additional management support is needed unless otherwise documented below in the visit note. 

## 2014-04-29 NOTE — Telephone Encounter (Signed)
Pt left v/m; pt was seen on 04/07/14 for back pain; back pain is getting worse; pt has been taking Flexeril. Pt thought Dr Darnell Level had mentioned if did not get better would get xrays. Pt request cb to see if needs xrays or f/u appt.

## 2014-04-29 NOTE — Assessment & Plan Note (Signed)
Exam today more consistent with L groin strain but not improving as would be expected. Provided with groin strain exercises from [facility] pt advisor and rec continued flexeril 10mg  tid prn, may alternate tylenol and aleve prn. Given persistent discomfort without significant improvement noted over last 3 weeks, suggested referrral to SM - and I will have [name] call her on Monday to schedule appt. Xray of pelvis obtained today to eval for avulsion injury - ok on my read, will await radiology eval. Pt agrees with plan

## 2014-04-29 NOTE — Patient Instructions (Addendum)
I'd like you to see Dr. Lorelei Pont or Dr. Tamala Julian to further evaluate this pain. Continue flexeril - may increase to three times a day as needed (watching for sedation). I'm going to give you some groin strain exercises to start while you see sports medicine doctors. xrays are overall looking ok today. Good to see you today Ok to restart aleve 1 tablet twice daily with food. Ok to take tylenol 1000mg  at a time. Rest leg as much as possible.

## 2014-04-30 ENCOUNTER — Encounter: Payer: Self-pay | Admitting: Family Medicine

## 2014-05-16 ENCOUNTER — Telehealth: Payer: Self-pay | Admitting: Family Medicine

## 2014-05-16 ENCOUNTER — Ambulatory Visit (INDEPENDENT_AMBULATORY_CARE_PROVIDER_SITE_OTHER): Payer: No Typology Code available for payment source | Admitting: Family Medicine

## 2014-05-16 ENCOUNTER — Encounter: Payer: Self-pay | Admitting: Family Medicine

## 2014-05-16 VITALS — BP 120/90 | HR 102 | Temp 98.3°F | Ht 62.0 in | Wt 128.0 lb

## 2014-05-16 DIAGNOSIS — S3981XA Other specified injuries of abdomen, initial encounter: Secondary | ICD-10-CM

## 2014-05-16 DIAGNOSIS — M67952 Unspecified disorder of synovium and tendon, left thigh: Secondary | ICD-10-CM

## 2014-05-16 DIAGNOSIS — IMO0002 Reserved for concepts with insufficient information to code with codable children: Secondary | ICD-10-CM

## 2014-05-16 DIAGNOSIS — M6790 Unspecified disorder of synovium and tendon, unspecified site: Secondary | ICD-10-CM

## 2014-05-16 DIAGNOSIS — M719 Bursopathy, unspecified: Secondary | ICD-10-CM

## 2014-05-16 MED ORDER — CYCLOBENZAPRINE HCL 10 MG PO TABS: 10.0000 mg | ORAL_TABLET | Freq: Three times a day (TID) | ORAL | Status: DC | PRN

## 2014-05-16 NOTE — Progress Notes (Signed)
Pre visit review using our clinic review tool, if applicable. No additional management support is needed unless otherwise documented below in the visit note. 
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
## 2014-05-16 NOTE — Telephone Encounter (Signed)
Relevant patient education mailed to patient.  

## 2014-08-14 DIAGNOSIS — Z87891 Personal history of nicotine dependence: Secondary | ICD-10-CM

## 2014-08-14 HISTORY — DX: Personal history of nicotine dependence: Z87.891

## 2014-08-22 ENCOUNTER — Other Ambulatory Visit: Payer: Self-pay | Admitting: Family Medicine

## 2014-08-29 ENCOUNTER — Other Ambulatory Visit: Payer: Self-pay | Admitting: Family Medicine

## 2014-08-29 DIAGNOSIS — R1032 Left lower quadrant pain: Secondary | ICD-10-CM

## 2014-08-29 NOTE — Telephone Encounter (Signed)
Last office visit 05/16/2014.  Last refilled 05/16/2014 for #60 with 1 refill.  Ok to refill?

## 2014-08-31 NOTE — Telephone Encounter (Signed)
Sent in.  Lindsay Mason, can we call and f/u with patient on pain? - if persistent discomfort would recommend PT referral or alternatively pt could return to see Dr Lorelei Pont.

## 2014-08-31 NOTE — Telephone Encounter (Signed)
Referral placed. plz send last 2 notes.

## 2014-08-31 NOTE — Telephone Encounter (Signed)
Spoke with patient and she said she has been doing the exercises as instructed and the pain is still there. Some days are better than others, but the pain is there pretty much all the time. She agrees to PT referral and will await call for scheduling.

## 2014-12-22 ENCOUNTER — Encounter: Payer: Self-pay | Admitting: Family Medicine

## 2014-12-22 ENCOUNTER — Ambulatory Visit (INDEPENDENT_AMBULATORY_CARE_PROVIDER_SITE_OTHER): Payer: No Typology Code available for payment source | Admitting: Family Medicine

## 2014-12-22 VITALS — BP 126/84 | HR 90 | Temp 98.4°F | Wt 127.8 lb

## 2014-12-22 DIAGNOSIS — Z87891 Personal history of nicotine dependence: Secondary | ICD-10-CM

## 2014-12-22 DIAGNOSIS — J208 Acute bronchitis due to other specified organisms: Secondary | ICD-10-CM | POA: Insufficient documentation

## 2014-12-22 MED ORDER — HYDROCOD POLST-CHLORPHEN POLST 10-8 MG/5ML PO LQCR: 5.0000 mL | Freq: Two times a day (BID) | ORAL | Status: DC

## 2014-12-22 NOTE — Patient Instructions (Signed)
I think we have viral upper respiratory infection and possibly viral bronchitis - this should continue to improve over time. Push fluids and rest. tussionex for cough mainly at night time. Watch for fever >101 or worsening productive cough or if symptoms persist past next week - if that happens call us for antibiotic course.

## 2014-12-22 NOTE — Progress Notes (Signed)
Pre visit review using our clinic review tool, if applicable. No additional management support is needed unless otherwise documented below in the visit note. 

## 2014-12-22 NOTE — Progress Notes (Signed)
BP 126/84 mmHg  Pulse 90  Temp(Src) 98.4 F (36.9 C) (Oral)  Wt 127 lb 12 oz (57.947 kg)  SpO2 97%   CC: cough  Subjective:    Patient ID: Lindsay Mason, female    DOB: 06/18/1951, 64 y.o.   MRN: [id number]  HPI: Lindsay Mason is a 64 y.o. female presenting on 12/22/2014 for Cough   2 wk h/o head congestion that may have progressed into chest. Cough mildly productive of phlegm. Seems to be improving. Initially with fevers but no longer. + ST and PNDrainage. Head congestion and mild chest congestion. Cough keeping her up some at night time.  No ear or tooth pain, wheezing or dyspnea.  Husband and other family sick recently as well.  Has quit smoking - using artifical cigarettes. Tapering down well (using one with and one without nicotine). Blue cigarette.  No h/o asthma. Tried alka seltzer cold and flu as well as PM version.  Seen 05/2014 with sports hernia seen by Dr Lorelei Pont [facility]. Also completed PT. Has home exercise program she does.  She did recently take zpack after dental cleaning 3 wks ago.  Relevant past medical, surgical, family and social history reviewed and updated as indicated. Interim medical history since our last visit reviewed. Allergies and medications reviewed and updated. Current Outpatient Prescriptions on File Prior to Visit  Medication Sig  . acetaminophen (TYLENOL) 500 MG tablet Take 1,000 mg by mouth every 8 (eight) hours as needed.  Marland Kitchen alendronate (FOSAMAX) 70 MG tablet take 1 tablet by mouth EVERY 7 DAYS, TAKE WITH A FULL GLASS OF WATER ON A EMPTY STOMACH  . aspirin EC 81 MG tablet Take 81 mg by mouth daily.    . B Complex-Biotin-FA (B-COMPLEX PO) Take 1 tablet by mouth daily.  Marland Kitchen buPROPion (WELLBUTRIN) 75 MG tablet TAKE 1 TABLET BY MOUTH 2 TIMES DAILY.  . Calcium Carbonate-Vitamin D (CALCIUM 600+D) 600-200 MG-UNIT TABS Take 1 tablet by mouth 2 (two) times daily.    . cyclobenzaprine (FLEXERIL) 10 MG tablet TAKE 1 TABLET BY MOUTH 3  TIMES A DAY AS NEEDED FOR MUSCLE SPASMS  . Multiple Vitamin (MULTIVITAMIN) tablet Take 1 tablet by mouth daily.    . naproxen sodium (ANAPROX) 220 MG tablet Takes 1 tablet every 4 hours, started first part of June when hurt side.  . vitamin B-12 (CYANOCOBALAMIN) 100 MCG tablet Take 100 mcg by mouth daily.     No current facility-administered medications on file prior to visit.    Review of Systems Per HPI unless specifically indicated above     Objective:    BP 126/84 mmHg  Pulse 90  Temp(Src) 98.4 F (36.9 C) (Oral)  Wt 127 lb 12 oz (57.947 kg)  SpO2 97%  Wt Readings from Last 3 Encounters:  12/22/14 127 lb 12 oz (57.947 kg)  05/16/14 128 lb (58.06 kg)  04/29/14 128 lb 12 oz (58.401 kg)    Physical Exam  Constitutional: She appears well-developed and well-nourished. No distress.  HENT:  Head: Normocephalic and atraumatic.  Right Ear: Hearing, tympanic membrane, external ear and ear canal normal.  Left Ear: Hearing, tympanic membrane, external ear and ear canal normal.  Nose: Mucosal edema (sinus congestion and injected nares ) present. No rhinorrhea. Right sinus exhibits no maxillary sinus tenderness and no frontal sinus tenderness. Left sinus exhibits no maxillary sinus tenderness and no frontal sinus tenderness.  Mouth/Throat: Uvula is midline, oropharynx is clear and moist and mucous membranes are normal. No  oropharyngeal exudate, posterior oropharyngeal edema, posterior oropharyngeal erythema or tonsillar abscesses.  Eyes: Conjunctivae and EOM are normal. Pupils are equal, round, and reactive to light. No scleral icterus.  Neck: Normal range of motion. Neck supple.  Cardiovascular: Normal rate, regular rhythm, normal heart sounds and intact distal pulses.   No murmur heard. Pulmonary/Chest: Effort normal and breath sounds normal. No respiratory distress. She has no wheezes. She has no rales.  Lymphadenopathy:    She has no cervical adenopathy.  Skin: Skin is warm and dry.  No rash noted.  Nursing note and vitals reviewed.     Assessment & Plan:   Problem List Items Addressed This Visit    Ex-smoker    Congratulated on only using e cig.      Acute viral bronchitis - Primary    Anticipate viral given improving on its own. Supportive care discussed. tussionex for cough at night time, discussed sedation precautions Red flags to return or seek care discussed. If not improving as expected, advised to call us in next week for abx course. She did just finish zpack course.          Follow up plan: Return if symptoms worsen or fail to improve.

## 2014-12-22 NOTE — Assessment & Plan Note (Signed)
Congratulated on only using e cig.

## 2014-12-22 NOTE — Assessment & Plan Note (Signed)
Anticipate viral given improving on its own. Supportive care discussed. tussionex for cough at night time, discussed sedation precautions Red flags to return or seek care discussed. If not improving as expected, advised to call us in next week for abx course. She did just finish zpack course.

## 2014-12-27 ENCOUNTER — Telehealth: Payer: Self-pay

## 2014-12-27 ENCOUNTER — Other Ambulatory Visit: Payer: Self-pay | Admitting: Family Medicine

## 2014-12-27 MED ORDER — AZITHROMYCIN 250 MG PO TABS: ORAL_TABLET | ORAL | Status: DC

## 2014-12-27 NOTE — Telephone Encounter (Signed)
Pt left v/m; pt was seen on 12/22/14 and pt has been taking tussionex for the cough; pt was to cb if not feeling better with URI and Dr Darnell Level would prescribe an abx. Pt request abx. Unable to reach pt to get additional info of symptoms and what pharmacy pt wants med sent to.

## 2014-12-27 NOTE — Telephone Encounter (Signed)
plz notify abx sent to pharmacy [facility]. will do second zpack course.

## 2014-12-27 NOTE — Telephone Encounter (Signed)
Message left notifying patient.

## 2015-02-25 ENCOUNTER — Other Ambulatory Visit: Payer: Self-pay | Admitting: Family Medicine

## 2015-04-22 ENCOUNTER — Other Ambulatory Visit: Payer: Self-pay | Admitting: Family Medicine

## 2015-05-21 ENCOUNTER — Other Ambulatory Visit: Payer: Self-pay | Admitting: Family Medicine

## 2015-06-05 ENCOUNTER — Encounter: Payer: Self-pay | Admitting: Family Medicine

## 2015-06-05 ENCOUNTER — Ambulatory Visit (INDEPENDENT_AMBULATORY_CARE_PROVIDER_SITE_OTHER): Payer: No Typology Code available for payment source | Admitting: Family Medicine

## 2015-06-05 VITALS — BP 128/84 | HR 82 | Temp 98.0°F | Wt 122.5 lb

## 2015-06-05 DIAGNOSIS — K52839 Microscopic colitis, unspecified: Secondary | ICD-10-CM | POA: Insufficient documentation

## 2015-06-05 DIAGNOSIS — R197 Diarrhea, unspecified: Secondary | ICD-10-CM

## 2015-06-05 LAB — COMPREHENSIVE METABOLIC PANEL
ALBUMIN: 4.4 g/dL (ref 3.5–5.2)
ALT: 15 U/L (ref 0–35)
AST: 22 U/L (ref 0–37)
Alkaline Phosphatase: 58 U/L (ref 39–117)
BUN: 15 mg/dL (ref 6–23)
CHLORIDE: 106 meq/L (ref 96–112)
CO2: 27 mEq/L (ref 19–32)
CREATININE: 0.78 mg/dL (ref 0.40–1.20)
Calcium: 9.5 mg/dL (ref 8.4–10.5)
GFR: 78.95 mL/min (ref >60.00)
Glucose, Bld: 94 mg/dL (ref 70–99)
Potassium: 3.6 mEq/L (ref 3.5–5.1)
Sodium: 142 mEq/L (ref 135–145)
Total Bilirubin: 0.6 mg/dL (ref 0.2–1.2)
Total Protein: 6.6 g/dL (ref 6.0–8.3)

## 2015-06-05 LAB — CBC WITH DIFFERENTIAL/PLATELET
BASOS PCT: 0.4 % (ref 0.0–3.0)
Basophils Absolute: 0 10*3/uL (ref 0.0–0.1)
EOS PCT: 0.2 % (ref 0.0–5.0)
Eosinophils Absolute: 0 10*3/uL (ref 0.0–0.7)
HCT: 43.4 % (ref 36.0–46.0)
Hemoglobin: 14.4 g/dL (ref 12.0–15.0)
LYMPHS ABS: 1.7 10*3/uL (ref 0.7–4.0)
Lymphocytes Relative: 20.8 % (ref 12.0–46.0)
MCHC: 33.3 g/dL (ref 30.0–36.0)
MCV: 95.2 fl (ref 78.0–100.0)
MONO ABS: 0.3 10*3/uL (ref 0.1–1.0)
MONOS PCT: 3.8 % (ref 3.0–12.0)
NEUTROS PCT: 74.8 % (ref 43.0–77.0)
Neutro Abs: 6.2 10*3/uL (ref 1.4–7.7)
Platelets: 215 10*3/uL (ref 150.0–400.0)
RBC: 4.56 Mil/uL (ref 3.87–5.11)
RDW: 13.8 % (ref 11.5–15.5)
WBC: 8.3 10*3/uL (ref 4.0–10.5)

## 2015-06-05 LAB — TSH: TSH: 1.32 u[IU]/mL (ref 0.35–4.50)

## 2015-06-05 MED ORDER — CIPROFLOXACIN HCL 500 MG PO TABS: 500.0000 mg | ORAL_TABLET | Freq: Two times a day (BID) | ORAL | Status: DC

## 2015-06-05 NOTE — Progress Notes (Signed)
BP 128/84 mmHg  Pulse 82  Temp(Src) 98 F (36.7 C) (Oral)  Wt 122 lb 8 oz (55.566 kg)   CC: diarrhea  Subjective:    Patient ID: Lindsay Mason, female    DOB: [birth year], 64 y.o.   MRN: [id number]  HPI: Lindsay Mason is a 64 y.o. female presenting on 06/05/2015 for Diarrhea   8d h/o diarrhea - 3-4 times daily. Over the pats 5 days only eating 3 pieces of toast. No appetite. Loose to watery runny diarrhea. + stool urgency and cramping that resolves with BM. Occasional nausea. Denies blood or mucous in stool. No fevers/chills. No vomiting. Diarrhea happens after each meal. + night time awakenings due to diarrhea, some accidents.   No sick contacts at home. No recent travel. No new restaurants or foods. Uses well water. No increased sugar intake. Has been around husband and grandchildren, no one else sick  Down 5 lbs.   Off all vitamins and meds. Off fosamax and wellbutrin. Has been taking imodium which helped some. No new medicines.   Smoking vapes. Pt tans regularly - at her pool and in her garden. Wears sunscreen  Lab Results  Component Value Date   CREATININE 0.82 01/01/2012     Relevant past medical, surgical, family and social history reviewed and updated as indicated. Interim medical history since our last visit reviewed. Allergies and medications reviewed and updated. Current Outpatient Prescriptions on File Prior to Visit  Medication Sig  . acetaminophen (TYLENOL) 500 MG tablet Take 1,000 mg by mouth every 8 (eight) hours as needed.  Marland Kitchen alendronate (FOSAMAX) 70 MG tablet TAKE 1 TABLET BY MOUTH EVERY 7 DAYS, TAKE WITH A FULL GLASS OF WATER ON A EMPTY STOMACH (Patient not taking: Reported on 06/05/2015)  . aspirin EC 81 MG tablet Take 81 mg by mouth daily.    . B Complex-Biotin-FA (B-COMPLEX PO) Take 1 tablet by mouth daily.  Marland Kitchen buPROPion (WELLBUTRIN) 75 MG tablet Take 1 tablet (75 mg total) by mouth 2 (two) times daily. (Patient not taking: Reported on 06/05/2015)    . Calcium Carbonate-Vitamin D (CALCIUM 600+D) 600-200 MG-UNIT TABS Take 1 tablet by mouth 2 (two) times daily.    . chlorpheniramine-HYDROcodone (TUSSIONEX PENNKINETIC ER) 10-8 MG/5ML LQCR Take 5 mLs by mouth every 12 (twelve) hours. Sedation precautions (Patient not taking: Reported on 06/05/2015)  . cyclobenzaprine (FLEXERIL) 10 MG tablet TAKE 1 TABLET BY MOUTH 3 TIMES A DAY AS NEEDED FOR MUSCLE SPASMS (Patient not taking: Reported on 06/05/2015)  . Multiple Vitamin (MULTIVITAMIN) tablet Take 1 tablet by mouth daily.    . naproxen sodium (ANAPROX) 220 MG tablet Takes 1 tablet every 4 hours, started first part of June when hurt side.  . vitamin B-12 (CYANOCOBALAMIN) 100 MCG tablet Take 100 mcg by mouth daily.     No current facility-administered medications on file prior to visit.    Review of Systems Per HPI unless specifically indicated above     Objective:    BP 128/84 mmHg  Pulse 82  Temp(Src) 98 F (36.7 C) (Oral)  Wt 122 lb 8 oz (55.566 kg)  Wt Readings from Last 3 Encounters:  06/05/15 122 lb 8 oz (55.566 kg)  12/22/14 127 lb 12 oz (57.947 kg)  05/16/14 128 lb (58.06 kg)    Physical Exam  Constitutional: She appears well-developed and well-nourished. No distress.  HENT:  Mouth/Throat: Oropharynx is clear and moist. No oropharyngeal exudate.  Cardiovascular: Normal rate, regular rhythm, normal heart sounds and  intact distal pulses.   No murmur heard. Pulmonary/Chest: Effort normal and breath sounds normal. No respiratory distress. She has no wheezes. She has no rales.  Abdominal: Soft. Normal appearance. She exhibits no distension and no mass. Bowel sounds are increased. There is no hepatosplenomegaly. There is no tenderness. There is no rigidity, no rebound, no guarding and negative Murphy's sign.  Musculoskeletal: She exhibits no edema.  Nursing note and vitals reviewed.     Assessment & Plan:   Problem List Items Addressed This Visit    Diarrhea - Primary     Unclear etiology. Uses well water but husband and grandchildren are not sick.  Will check labs and stool studies today including CBC, CMP, TSH, stool culture, O&P and C diff. Start with investigation for infectious cause of diarrhea. If unrevealing, further eval for malabsorptive or other cause. Treat empirically with 3d cipro '500mg'$  bid course.  No h/o colon cancer screening - pt never returned iFOB.       Relevant Orders   Comprehensive metabolic panel   TSH   CBC with Differential/Platelet   Stool culture   Ova and Parasite Examination   C. difficile GDH and Toxin A/B       Follow up plan: Return if symptoms worsen or fail to improve.

## 2015-06-05 NOTE — Assessment & Plan Note (Addendum)
Unclear etiology. Uses well water but husband and grandchildren are not sick.  Will check labs and stool studies today including CBC, CMP, TSH, stool culture, O&P and C diff. Start with investigation for infectious cause of diarrhea. If unrevealing, further eval for malabsorptive or other cause. Treat empirically with 3d cipro '500mg'$  bid course.  No h/o colon cancer screening - pt never returned iFOB.

## 2015-06-05 NOTE — Patient Instructions (Signed)
labwork today. Pass by lab for a stool kit. We will treat with 3 d course of cipro. Start antibiotic after you complete stool tests. Let me know if not improving with treatment as expected.

## 2015-06-05 NOTE — Progress Notes (Signed)
Pre visit review using our clinic review tool, if applicable. No additional management support is needed unless otherwise documented below in the visit note. 

## 2015-06-07 LAB — C. DIFFICILE GDH AND TOXIN A/B
C. difficile GDH: NOT DETECTED
C. difficile Toxin A/B: NOT DETECTED

## 2015-06-07 LAB — OVA AND PARASITE EXAMINATION: OP: NONE SEEN

## 2015-06-10 LAB — STOOL CULTURE

## 2015-06-13 ENCOUNTER — Telehealth: Payer: Self-pay

## 2015-06-13 DIAGNOSIS — R197 Diarrhea, unspecified: Secondary | ICD-10-CM

## 2015-06-13 NOTE — Telephone Encounter (Signed)
Evaluation for infection returned negative, sxs did not improve on cipro. Recommend GI referral for further evaluation of diarrhea as well as possible colonoscopy as she has not had colon cancer screening in the past.

## 2015-06-13 NOTE — Telephone Encounter (Signed)
Pt aware. Will await a call from [facility] to sch appt

## 2015-06-13 NOTE — Telephone Encounter (Signed)
Pt said went out to eat on 06/09/15 and ate baked potato, green beans and pt had loose diarrhea on Fri night. On 06/10/15 went back on dry toast and on 06/11/15 had dry toast with peanut; pt continued with diarrhea. Pt has had accidents during the night because already had diarrhea stool before wakes up. Pt wants to know next step. No fever and no abd pain. Pt request cb.

## 2015-06-15 ENCOUNTER — Ambulatory Visit (INDEPENDENT_AMBULATORY_CARE_PROVIDER_SITE_OTHER): Payer: No Typology Code available for payment source | Admitting: Gastroenterology

## 2015-06-15 ENCOUNTER — Encounter: Payer: Self-pay | Admitting: Gastroenterology

## 2015-06-15 VITALS — BP 102/60 | HR 72 | Ht 61.5 in | Wt 126.0 lb

## 2015-06-15 DIAGNOSIS — R197 Diarrhea, unspecified: Secondary | ICD-10-CM | POA: Diagnosis not present

## 2015-06-15 DIAGNOSIS — K52839 Microscopic colitis, unspecified: Secondary | ICD-10-CM

## 2015-06-15 HISTORY — DX: Microscopic colitis, unspecified: K52.839

## 2015-06-15 HISTORY — PX: COLONOSCOPY: SHX174

## 2015-06-15 MED ORDER — MOVIPREP 100 G PO SOLR: 1.0000 | Freq: Once | ORAL | Status: DC

## 2015-06-15 NOTE — Progress Notes (Signed)
HPI: This is a  very pleasant 64 year old woman    who was referred to me by Ria Bush, MD  to evaluate  diarrhea .    Chief complaint is diarrhea  Urgent diarrhea, even nocturnal.  Has been going on for 3 weeks.  At it's worst she would go 6 times per day.  No recent antibiotics  Her cousin had vomiting, idarhrea about a week prior.  Was given cipro 3 day course and around then her diarrhea improved.  Was taking about 4 imodium a day but stopped  Lost 5 pounds.  Stools still a bit runny but much improved.   Labs 05/2015: Routine stool culture was negative, Clostridium difficile testing was negative, ova and parasites was negative. CBC was normal, complete medical profile was normal, TSH was normal.  Review of systems: Pertinent positive and negative review of systems were noted in the above HPI section. Complete review of systems was performed and was otherwise normal.   Past Medical History  Diagnosis Date  . Osteoporosis, unspecified   . Ex-smoker 08/2014    currently using e cig  . Chest pain     normal ETT 10/2011  . CAP (community acquired pneumonia) 12/2011  . Adrenal hyperplasia 12/2011    on CT  . Scoliosis of lumbar spine     Past Surgical History  Procedure Laterality Date  . Vein removal      removed from legs  . Dexa  10/2008    Osteoporosis  . Tonsillectomy and adenoidectomy    . Exploratory laparotomy    . Mri head  07/2011    multiple foci deep and subcortical white matter, chronic ischemic vs demyelinating, no active disease  . Ett  10/2011    WNL, no evidence of ischemia, excellent exercise tolerance  . Dexa  02/2014    T score osteopenia hip, osteoporosis spine, scoliosis    Current Outpatient Prescriptions  Medication Sig Dispense Refill  . alendronate (FOSAMAX) 70 MG tablet TAKE 1 TABLET BY MOUTH EVERY 7 DAYS, TAKE WITH A FULL GLASS OF WATER ON A EMPTY STOMACH 4 tablet 11  . aspirin EC 81 MG tablet Take 81 mg by mouth daily.      . B  Complex-Biotin-FA (B-COMPLEX PO) Take 1 tablet by mouth daily.    Marland Kitchen buPROPion (WELLBUTRIN) 75 MG tablet Take 1 tablet (75 mg total) by mouth 2 (two) times daily. 60 tablet 6  . Calcium Carbonate-Vitamin D (CALCIUM 600+D) 600-200 MG-UNIT TABS Take 1 tablet by mouth 2 (two) times daily.      . chlorpheniramine-HYDROcodone (TUSSIONEX PENNKINETIC ER) 10-8 MG/5ML LQCR Take 5 mLs by mouth every 12 (twelve) hours. Sedation precautions 120 mL 0  . loperamide (IMODIUM A-D) 2 MG tablet Take 2 mg by mouth 4 (four) times daily as needed for diarrhea or loose stools.    . Multiple Vitamin (MULTIVITAMIN) tablet Take 1 tablet by mouth daily.      . vitamin B-12 (CYANOCOBALAMIN) 100 MCG tablet Take 100 mcg by mouth daily.       No current facility-administered medications for this visit.    Allergies as of 06/15/2015 - Review Complete 06/15/2015  Allergen Reaction Noted  . Penicillins      Family History  Problem Relation Age of Onset  . Osteoporosis      great grandmother  . Stroke Maternal Grandmother   . Coronary artery disease Maternal Grandfather 50  . Diabetes Maternal Grandfather   . Coronary artery disease Paternal Grandmother  54  . Coronary artery disease Paternal Grandfather 36  . Lung cancer Maternal Aunt     Social History   Social History  . Marital Status: Married    Spouse Name: N/A  . Number of Children: 3  . Years of Education: N/A   Occupational History  . retired    Social History Main Topics  . Smoking status: Former Smoker    Types: Cigarettes    Start date: 10/14/1978    Quit date: 08/14/2014  . Smokeless tobacco: Never Used     Comment: off and on smoker, now e-cigarettes  . Alcohol Use: 0.0 oz/week    0 Standard drinks or equivalent per week     Comment: social wine once a week  . Drug Use: No  . Sexual Activity: Not on file   Other Topics Concern  . Not on file   Social History Narrative   Caffeine: 1 cup coffee/day   Lives with husband, outside dog    Smoking vapes.   Pt tans regularly - at her pool and in her garden. Wears sunscreen     Physical Exam: BP 102/60 mmHg  Pulse 72  Ht 5' 1.5" (1.562 m)  Wt 126 lb (57.153 kg)  BMI 23.42 kg/m2 Constitutional: generally well-appearing Psychiatric: alert and oriented x3 Eyes: extraocular movements intact Mouth: oral pharynx moist, no lesions Neck: supple no lymphadenopathy Cardiovascular: heart regular rate and rhythm Lungs: clear to auscultation bilaterally Abdomen: soft, nontender, nondistended, no obvious ascites, no peritoneal signs, normal bowel sounds Extremities: no lower extremity edema bilaterally Skin: no lesions on visible extremities   Assessment and plan: 64 y.o. female with  recent diarrheal illness  I suspect this was an infectious etiology. Her cousin had vomiting and diarrhea about a week prior to her symptoms and they met on more than one occasion during that time. Her symptoms are clearly prolonged however. Stool testing hasn't clearly shown any specific infectious agent as is usually the case. Her loose stools, diarrhea has definitely improved but she is not back to normal. I recommended she start taking Imodium once or twice a day. I also recommended we proceed with colonoscopy at her soonest convenience.   Owens Loffler, MD [facility] 06/15/2015, 3:07 PM  Cc: Ria Bush, MD

## 2015-06-15 NOTE — Patient Instructions (Signed)
You will be set up for a colonoscopy for diarrhea. Ok to still use imodium 1-2 times per day if needed. If you worsen, please call.

## 2015-06-27 ENCOUNTER — Encounter: Payer: Self-pay | Admitting: Gastroenterology

## 2015-06-27 ENCOUNTER — Ambulatory Visit (AMBULATORY_SURGERY_CENTER): Payer: No Typology Code available for payment source | Admitting: Gastroenterology

## 2015-06-27 VITALS — BP 131/79 | HR 67 | Temp 97.7°F | Resp 16 | Ht 61.5 in | Wt 126.0 lb

## 2015-06-27 DIAGNOSIS — K630 Abscess of intestine: Secondary | ICD-10-CM | POA: Diagnosis not present

## 2015-06-27 DIAGNOSIS — R197 Diarrhea, unspecified: Secondary | ICD-10-CM

## 2015-06-27 MED ORDER — SODIUM CHLORIDE 0.9 % IV SOLN: 500.0000 mL | INTRAVENOUS | Status: DC

## 2015-06-27 NOTE — Progress Notes (Signed)
Transferred to recovery room. A/O x3, pleased with MAC.  VSS.  Report to Shelia, RN. 

## 2015-06-27 NOTE — Progress Notes (Signed)
Called to room to assist during endoscopic procedure.  Patient ID and intended procedure confirmed with present staff. Received instructions for my participation in the procedure from the performing physician.  

## 2015-06-27 NOTE — Op Note (Signed)
[place]  [address]. [place], [id number]   COLONOSCOPY PROCEDURE REPORT  PATIENT: Lindsay, Mason  MR#: [id number] BIRTHDATE: [birth year] , 71  yrs. old GENDER: female ENDOSCOPIST: Milus Banister, MD REFERRED GM:WNUUVO Gutierrez, M.D. PROCEDURE DATE:  06/27/2015 PROCEDURE:   Colonoscopy, diagnostic and Colonoscopy with biopsy First Screening Colonoscopy - Avg.  risk and is 50 yrs.  old or older - No.  Prior Negative Screening - Now for repeat screening. N/A  History of Adenoma - Now for follow-up colonoscopy & has been > or = to 3 yrs.  N/A  Recommend repeat exam, <10 yrs? No ASA CLASS:   Class II INDICATIONS:acute diarrheal illness 3-4 weeks ago, still with loose stools; lab/stool workup all neg. MEDICATIONS: Monitored anesthesia care and Propofol 175 mg IV  DESCRIPTION OF PROCEDURE:   After the risks benefits and alternatives of the procedure were thoroughly explained, informed consent was obtained.  The digital rectal exam revealed no abnormalities of the rectum.   The LB [id number]  endoscope was introduced through the anus and advanced to the terminal ileum which was intubated for a short distance. No adverse events experienced.   The quality of the prep was excellent.  The instrument was then slowly withdrawn as the colon was fully examined. Estimated blood loss is zero unless otherwise noted in this procedure report.      COLON FINDINGS: The terminal ileum was normal.  The colonic mucosa was speckeled with very small whitish "petechia" throughout, most signficantly in the distal colon.  This was not clearly inflammatory and obvious not neoplastic. 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  Multiple biopsies were taken and sent to path.  The examination was otherwise normal.  Retroflexed views revealed no abnormalities. The time to cecum = 5.0 Withdrawal time = 8.4   The scope was  withdrawn and the procedure completed. COMPLICATIONS: There were no immediate complications.  ENDOSCOPIC IMPRESSION: The terminal ileum was normal.  The colonic mucosa was speckeled with very small whitish "petechia" throughout, most signficantly in the distal colon.  This was not clearly inflammatory and obvious not neoplastic.  Multiple biopsies were taken and sent to path. The examination was otherwise normal.  RECOMMENDATIONS: Await pathology results. For now please resume imodium one pill twice daily on a scheduled basis (first thing in the morning and just before going to bed at night).  eSigned:  Milus Banister, MD 06/27/2015 2:32 PM      PATIENT NAME:  Lindsay, Mason MR#: [id number]

## 2015-06-27 NOTE — Patient Instructions (Signed)
YOU HAD AN ENDOSCOPIC PROCEDURE TODAY AT THE [facility]:   Refer to the procedure report that was given to you for any specific questions about what was found during the examination.  If the procedure report does not answer your questions, please call your gastroenterologist to clarify.  If you requested that your care partner not be given the details of your procedure findings, then the procedure report has been included in a sealed envelope for you to review at your convenience later.  YOU SHOULD EXPECT: Some feelings of bloating in the abdomen. Passage of more gas than usual.  Walking can help get rid of the air that was put into your GI tract during the procedure and reduce the bloating. If you had a lower endoscopy (such as a colonoscopy or flexible sigmoidoscopy) you may notice spotting of blood in your stool or on the toilet paper. If you underwent a bowel prep for your procedure, you may not have a normal bowel movement for a few days.  Please Note:  You might notice some irritation and congestion in your nose or some drainage.  This is from the oxygen used during your procedure.  There is no need for concern and it should clear up in a day or so.  SYMPTOMS TO REPORT IMMEDIATELY:   Following lower endoscopy (colonoscopy or flexible sigmoidoscopy):  Excessive amounts of blood in the stool  Significant tenderness or worsening of abdominal pains  Swelling of the abdomen that is new, acute  Fever of 100F or higher   For urgent or emergent issues, a gastroenterologist can be reached at any hour by calling [phone].   DIET: Your first meal following the procedure should be a small meal and then it is ok to progress to your normal diet. Heavy or fried foods are harder to digest and may make you feel nauseous or bloated.  Likewise, meals heavy in dairy and vegetables can increase bloating.  Drink plenty of fluids but you should avoid alcoholic beverages for 24  hours.  ACTIVITY:  You should plan to take it easy for the rest of today and you should NOT DRIVE or use heavy machinery until tomorrow (because of the sedation medicines used during the test).    FOLLOW UP: Our staff will call the number listed on your records the next business day following your procedure to check on you and address any questions or concerns that you may have regarding the information given to you following your procedure. If we do not reach you, we will leave a message.  However, if you are feeling well and you are not experiencing any problems, there is no need to return our call.  We will assume that you have returned to your regular daily activities without incident.  If any biopsies were taken you will be contacted by phone or by letter within the next 1-3 weeks.  Please call us at [phone] if you have not heard about the biopsies in 3 weeks.    SIGNATURES/CONFIDENTIALITY: You and/or your care partner have signed paperwork which will be entered into your electronic medical record.  These signatures attest to the fact that that the information above on your After Visit Summary has been reviewed and is understood.  Full responsibility of the confidentiality of this discharge information lies with you and/or your care-partner.   Resume medications. 

## 2015-06-28 ENCOUNTER — Telehealth: Payer: Self-pay

## 2015-06-28 NOTE — Telephone Encounter (Signed)
  Follow up Call-  Call back number 06/27/2015  Post procedure Call Back phone  # [phone]  Permission to leave phone message Yes     Patient questions:  Do you have a fever, pain , or abdominal swelling? No. Pain Score  0 *  Have you tolerated food without any problems? Yes.    Have you been able to return to your normal activities? Yes.    Do you have any questions about your discharge instructions: Diet   No. Medications  No. Follow up visit  No.  Do you have questions or concerns about your Care? No.  Actions: * If pain score is 4 or above: No action needed, pain <4.

## 2015-07-07 ENCOUNTER — Other Ambulatory Visit: Payer: Self-pay | Admitting: *Deleted

## 2015-07-07 ENCOUNTER — Telehealth: Payer: Self-pay | Admitting: Gastroenterology

## 2015-07-07 MED ORDER — BUDESONIDE 3 MG PO CPEP: ORAL_CAPSULE | ORAL | Status: DC

## 2015-07-07 NOTE — Telephone Encounter (Signed)
Thanks for looking into that.  Ask Ms. Climer if that is more reasonable.  If not, then can try low dose prednisone ('5mg'$  once daily without taper for 1-2 months). Would be much cheaper but also more risk of steroid side effects.  '5mg'$  a day is very low dose so chances of signficant side effects are probably low.

## 2015-07-07 NOTE — Telephone Encounter (Signed)
Dr. [name], We sent in her rx for Entocort and patient states that it is going to cost her 800.00 a month. She wants to know if there is something we can prescribe? Please advise

## 2015-07-07 NOTE — Telephone Encounter (Signed)
Can you try generic alternative to entocort?  If that is not available can you call the drug company and ask about any assistance plans they may have?

## 2015-07-07 NOTE — Telephone Encounter (Signed)
Spoke to patient pharamacy and they did send in the generic form on Entocort (budesonide). With her insurance it is going to cost her $800.00 and they will not do a PA on this medication. Called over to [employer] to help with patient assistance and they stated that they sold their right over to [facility]. Spoke to a representative with [employer] and she stated that they do not have a patient assistance program for  medication yet and advised me to try [facility] website. Went to [employer] and with their discount drug card it will cost her $350 for qty 90 caps.

## 2015-07-10 NOTE — Telephone Encounter (Signed)
Spoke to patient regarding the different options. Patient states that she would like to take the Budesonide. Patient was set up with [facility] and prescription sent to [facility]. Patient to call back with any question or concerns.

## 2015-07-12 ENCOUNTER — Encounter: Payer: Self-pay | Admitting: Family Medicine

## 2015-08-01 ENCOUNTER — Encounter: Payer: Self-pay | Admitting: Gastroenterology

## 2015-08-03 ENCOUNTER — Telehealth: Payer: Self-pay | Admitting: Gastroenterology

## 2015-08-03 NOTE — Telephone Encounter (Signed)
Pt states the budesonide script is over $800 and the pharmacy would not take the coupon. Is there an alternative pt can try? Please advise.

## 2015-08-04 MED ORDER — BUDESONIDE 9 MG PO TB24: 9.0000 mg | ORAL_TABLET | Freq: Every day | ORAL | Status: DC

## 2015-08-04 NOTE — Telephone Encounter (Signed)
Spoke with pt and she is aware, new script sent to pharmacy. 

## 2015-08-04 NOTE — Telephone Encounter (Signed)
Ok, lets try brand name Uceris , '9mg'$  pill once pill once daily, 30 pills, 3 refills.

## 2015-11-20 ENCOUNTER — Ambulatory Visit: Payer: No Typology Code available for payment source | Admitting: Gastroenterology

## 2016-01-08 ENCOUNTER — Telehealth: Payer: Self-pay

## 2016-01-08 NOTE — Telephone Encounter (Signed)
Yes hold fosamax for next few months around dental work.  Will discuss rpt bone density scan likely due 02/2016 - at next CPE. Schedule as due.

## 2016-01-08 NOTE — Telephone Encounter (Signed)
Pt left v/m; pts dentist wants to know if pt can stop Alendronate 70 mg while has a deep cleaning and pull a tooth on 02/06/2016; pt wants to know if pt needs to have bone density. Pt request cb. Last bone density was 02/15/14.Please advise.

## 2016-01-09 NOTE — Telephone Encounter (Signed)
Patient notified and CPE scheduled.

## 2016-01-10 ENCOUNTER — Telehealth: Payer: Self-pay | Admitting: *Deleted

## 2016-01-10 NOTE — Telephone Encounter (Signed)
Patient dropped of form for med clearance for periodontal procedure. In your IN box. She is already aware to hold Fosamax.

## 2016-01-14 NOTE — Telephone Encounter (Signed)
Filled and in [name]'s box. Is patient still taking budesonide? Should not suddenly stop medication.

## 2016-01-15 NOTE — Telephone Encounter (Signed)
Message left advising patient and form placed up front for pick up.

## 2016-01-24 ENCOUNTER — Other Ambulatory Visit: Payer: Self-pay | Admitting: Family Medicine

## 2016-01-24 DIAGNOSIS — Z1159 Encounter for screening for other viral diseases: Secondary | ICD-10-CM

## 2016-01-24 DIAGNOSIS — Z13220 Encounter for screening for lipoid disorders: Secondary | ICD-10-CM

## 2016-01-24 DIAGNOSIS — M810 Age-related osteoporosis without current pathological fracture: Secondary | ICD-10-CM

## 2016-01-24 DIAGNOSIS — I10 Essential (primary) hypertension: Secondary | ICD-10-CM

## 2016-01-25 ENCOUNTER — Other Ambulatory Visit (INDEPENDENT_AMBULATORY_CARE_PROVIDER_SITE_OTHER): Payer: BLUE CROSS/BLUE SHIELD

## 2016-01-25 ENCOUNTER — Other Ambulatory Visit: Payer: Self-pay | Admitting: Family Medicine

## 2016-01-25 DIAGNOSIS — I10 Essential (primary) hypertension: Secondary | ICD-10-CM | POA: Diagnosis not present

## 2016-01-25 DIAGNOSIS — M810 Age-related osteoporosis without current pathological fracture: Secondary | ICD-10-CM | POA: Diagnosis not present

## 2016-01-25 DIAGNOSIS — Z13220 Encounter for screening for lipoid disorders: Secondary | ICD-10-CM

## 2016-01-25 DIAGNOSIS — Z1159 Encounter for screening for other viral diseases: Secondary | ICD-10-CM

## 2016-01-25 LAB — BASIC METABOLIC PANEL
BUN: 26 mg/dL — ABNORMAL HIGH (ref 6–23)
CALCIUM: 9.7 mg/dL (ref 8.4–10.5)
CO2: 28 meq/L (ref 19–32)
CREATININE: 0.62 mg/dL (ref 0.40–1.20)
Chloride: 107 mEq/L (ref 96–112)
GFR: 102.7 mL/min (ref >60.00)
Glucose, Bld: 99 mg/dL (ref 70–99)
Potassium: 4.1 mEq/L (ref 3.5–5.1)
Sodium: 141 mEq/L (ref 135–145)

## 2016-01-25 LAB — LIPID PANEL
CHOLESTEROL: 188 mg/dL (ref 0–200)
HDL: 90.6 mg/dL (ref >39.00)
LDL CALC: 89 mg/dL (ref 0–99)
NONHDL: 96.93
Total CHOL/HDL Ratio: 2
Triglycerides: 40 mg/dL (ref 0.0–149.0)
VLDL: 8 mg/dL (ref 0.0–40.0)

## 2016-01-25 LAB — TSH: TSH: 2.44 u[IU]/mL (ref 0.35–4.50)

## 2016-01-26 LAB — HEPATITIS C ANTIBODY: HCV Ab: NEGATIVE

## 2016-02-01 ENCOUNTER — Ambulatory Visit (INDEPENDENT_AMBULATORY_CARE_PROVIDER_SITE_OTHER): Payer: BLUE CROSS/BLUE SHIELD | Admitting: Family Medicine

## 2016-02-01 ENCOUNTER — Telehealth: Payer: Self-pay | Admitting: Family Medicine

## 2016-02-01 ENCOUNTER — Encounter: Payer: Self-pay | Admitting: Family Medicine

## 2016-02-01 VITALS — BP 106/62 | HR 72 | Temp 98.1°F | Ht 61.5 in | Wt 111.8 lb

## 2016-02-01 DIAGNOSIS — Z87891 Personal history of nicotine dependence: Secondary | ICD-10-CM

## 2016-02-01 DIAGNOSIS — M810 Age-related osteoporosis without current pathological fracture: Secondary | ICD-10-CM

## 2016-02-01 DIAGNOSIS — K52839 Microscopic colitis, unspecified: Secondary | ICD-10-CM

## 2016-02-01 DIAGNOSIS — Z23 Encounter for immunization: Secondary | ICD-10-CM

## 2016-02-01 DIAGNOSIS — R1032 Left lower quadrant pain: Secondary | ICD-10-CM

## 2016-02-01 DIAGNOSIS — R1030 Lower abdominal pain, unspecified: Secondary | ICD-10-CM | POA: Diagnosis not present

## 2016-02-01 DIAGNOSIS — R634 Abnormal weight loss: Secondary | ICD-10-CM | POA: Insufficient documentation

## 2016-02-01 DIAGNOSIS — Z1239 Encounter for other screening for malignant neoplasm of breast: Secondary | ICD-10-CM

## 2016-02-01 DIAGNOSIS — Z0000 Encounter for general adult medical examination without abnormal findings: Secondary | ICD-10-CM | POA: Diagnosis not present

## 2016-02-01 DIAGNOSIS — I10 Essential (primary) hypertension: Secondary | ICD-10-CM

## 2016-02-01 NOTE — Patient Instructions (Addendum)
Tdap today. Pass by Marion's office to schedule repeat bone density scan.  Call to schedule mammogram at breast center.  We will call you to schedule lung cancer screening.  Return for pap smear at your convenience with [name] Good to see you today, call us with questions. Return as needed or in 1 year for next physical  Health Maintenance, Female Adopting a healthy lifestyle and getting preventive care can go a long way to promote health and wellness. Talk with your health care provider about what schedule of regular examinations is right for you. This is a good chance for you to check in with your provider about disease prevention and staying healthy. In between checkups, there are plenty of things you can do on your own. Experts have done a lot of research about which lifestyle changes and preventive measures are most likely to keep you healthy. Ask your health care provider for more information. WEIGHT AND DIET  Eat a healthy diet  Be sure to include plenty of vegetables, fruits, low-fat dairy products, and lean protein.  Do not eat a lot of foods high in solid fats, added sugars, or salt.  Get regular exercise. This is one of the most important things you can do for your health.  Most adults should exercise for at least 150 minutes each week. The exercise should increase your heart rate and make you sweat (moderate-intensity exercise).  Most adults should also do strengthening exercises at least twice a week. This is in addition to the moderate-intensity exercise.  Maintain a healthy weight  Body mass index (BMI) is a measurement that can be used to identify possible weight problems. It estimates body fat based on height and weight. Your health care provider can help determine your BMI and help you achieve or maintain a healthy weight.  For females 1 years of age and older:   A BMI below 18.5 is considered underweight.  A BMI of 18.5 to 24.9 is normal.  A BMI of 25 to 29.9  is considered overweight.  A BMI of 30 and above is considered obese.  Watch levels of cholesterol and blood lipids  You should start having your blood tested for lipids and cholesterol at 65 years of age, then have this test every 5 years.  You may need to have your cholesterol levels checked more often if:  Your lipid or cholesterol levels are high.  You are older than 65 years of age.  You are at high risk for heart disease.  CANCER SCREENING   Lung Cancer  Lung cancer screening is recommended for adults 62-58 years old who are at high risk for lung cancer because of a history of smoking.  A yearly low-dose CT scan of the lungs is recommended for people who:  Currently smoke.  Have quit within the past 15 years.  Have at least a 30-pack-year history of smoking. A pack year is smoking an average of one pack of cigarettes a day for 1 year.  Yearly screening should continue until it has been 15 years since you quit.  Yearly screening should stop if you develop a health problem that would prevent you from having lung cancer treatment.  Breast Cancer  Practice breast self-awareness. This means understanding how your breasts normally appear and feel.  It also means doing regular breast self-exams. Let your health care provider know about any changes, no matter how small.  If you are in your 20s or 30s, you should have a clinical  breast exam (CBE) by a health care provider every 1-3 years as part of a regular health exam.  If you are 24 or older, have a CBE every year. Also consider having a breast X-ray (mammogram) every year.  If you have a family history of breast cancer, talk to your health care provider about genetic screening.  If you are at high risk for breast cancer, talk to your health care provider about having an MRI and a mammogram every year.  Breast cancer gene (BRCA) assessment is recommended for women who have family members with BRCA-related cancers.  BRCA-related cancers include:  Breast.  Ovarian.  Tubal.  Peritoneal cancers.  Results of the assessment will determine the need for genetic counseling and BRCA1 and BRCA2 testing. Cervical Cancer Your health care provider may recommend that you be screened regularly for cancer of the pelvic organs (ovaries, uterus, and vagina). This screening involves a pelvic examination, including checking for microscopic changes to the surface of your cervix (Pap test). You may be encouraged to have this screening done every 3 years, beginning at age 67.  For women ages 34-65, health care providers may recommend pelvic exams and Pap testing every 3 years, or they may recommend the Pap and pelvic exam, combined with testing for human papilloma virus (HPV), every 5 years. Some types of HPV increase your risk of cervical cancer. Testing for HPV may also be done on women of any age with unclear Pap test results.  Other health care providers may not recommend any screening for nonpregnant women who are considered low risk for pelvic cancer and who do not have symptoms. Ask your health care provider if a screening pelvic exam is right for you.  If you have had past treatment for cervical cancer or a condition that could lead to cancer, you need Pap tests and screening for cancer for at least 20 years after your treatment. If Pap tests have been discontinued, your risk factors (such as having a new sexual partner) need to be reassessed to determine if screening should resume. Some women have medical problems that increase the chance of getting cervical cancer. In these cases, your health care provider may recommend more frequent screening and Pap tests. Colorectal Cancer  This type of cancer can be detected and often prevented.  Routine colorectal cancer screening usually begins at 65 years of age and continues through 65 years of age.  Your health care provider may recommend screening at an earlier age if you  have risk factors for colon cancer.  Your health care provider may also recommend using home test kits to check for hidden blood in the stool.  A small camera at the end of a tube can be used to examine your colon directly (sigmoidoscopy or colonoscopy). This is done to check for the earliest forms of colorectal cancer.  Routine screening usually begins at age 71.  Direct examination of the colon should be repeated every 5-10 years through 65 years of age. However, you may need to be screened more often if early forms of precancerous polyps or small growths are found. Skin Cancer  Check your skin from head to toe regularly.  Tell your health care provider about any new moles or changes in moles, especially if there is a change in a mole's shape or color.  Also tell your health care provider if you have a mole that is larger than the size of a pencil eraser.  Always use sunscreen. Apply sunscreen liberally and  repeatedly throughout the day.  Protect yourself by wearing long sleeves, pants, a wide-brimmed hat, and sunglasses whenever you are outside. HEART DISEASE, DIABETES, AND HIGH BLOOD PRESSURE   High blood pressure causes heart disease and increases the risk of stroke. High blood pressure is more likely to develop in:  People who have blood pressure in the high end of the normal range (130-139/85-89 mm Hg).  People who are overweight or obese.  People who are African American.  If you are 69-70 years of age, have your blood pressure checked every 3-5 years. If you are 16 years of age or older, have your blood pressure checked every year. You should have your blood pressure measured twice--once when you are at a hospital or clinic, and once when you are not at a hospital or clinic. Record the average of the two measurements. To check your blood pressure when you are not at a hospital or clinic, you can use:  An automated blood pressure machine at a pharmacy.  A home blood pressure  monitor.  If you are between 49 years and 69 years old, ask your health care provider if you should take aspirin to prevent strokes.  Have regular diabetes screenings. This involves taking a blood sample to check your fasting blood sugar level.  If you are at a normal weight and have a low risk for diabetes, have this test once every three years after 65 years of age.  If you are overweight and have a high risk for diabetes, consider being tested at a younger age or more often. PREVENTING INFECTION  Hepatitis B  If you have a higher risk for hepatitis B, you should be screened for this virus. You are considered at high risk for hepatitis B if:  You were born in a country where hepatitis B is common. Ask your health care provider which countries are considered high risk.  Your parents were born in a high-risk country, and you have not been immunized against hepatitis B (hepatitis B vaccine).  You have HIV or AIDS.  You use needles to inject street drugs.  You live with someone who has hepatitis B.  You have had sex with someone who has hepatitis B.  You get hemodialysis treatment.  You take certain medicines for conditions, including cancer, organ transplantation, and autoimmune conditions. Hepatitis C  Blood testing is recommended for:  Everyone born from [birth year] through 1965.  Anyone with known risk factors for hepatitis C. Sexually transmitted infections (STIs)  You should be screened for sexually transmitted infections (STIs) including gonorrhea and chlamydia if:  You are sexually active and are younger than 65 years of age.  You are older than 65 years of age and your health care provider tells you that you are at risk for this type of infection.  Your sexual activity has changed since you were last screened and you are at an increased risk for chlamydia or gonorrhea. Ask your health care provider if you are at risk.  If you do not have HIV, but are at risk, it may be  recommended that you take a prescription medicine daily to prevent HIV infection. This is called pre-exposure prophylaxis (PrEP). You are considered at risk if:  You are sexually active and do not regularly use condoms or know the HIV status of your partner(s).  You take drugs by injection.  You are sexually active with a partner who has HIV. Talk with your health care provider about whether you are at  high risk of being infected with HIV. If you choose to begin PrEP, you should first be tested for HIV. You should then be tested every 3 months for as long as you are taking PrEP.  PREGNANCY   If you are premenopausal and you may become pregnant, ask your health care provider about preconception counseling.  If you may become pregnant, take 400 to 800 micrograms (mcg) of folic acid every day.  If you want to prevent pregnancy, talk to your health care provider about birth control (contraception). OSTEOPOROSIS AND MENOPAUSE   Osteoporosis is a disease in which the bones lose minerals and strength with aging. This can result in serious bone fractures. Your risk for osteoporosis can be identified using a bone density scan.  If you are 38 years of age or older, or if you are at risk for osteoporosis and fractures, ask your health care provider if you should be screened.  Ask your health care provider whether you should take a calcium or vitamin D supplement to lower your risk for osteoporosis.  Menopause may have certain physical symptoms and risks.  Hormone replacement therapy may reduce some of these symptoms and risks. Talk to your health care provider about whether hormone replacement therapy is right for you.  HOME CARE INSTRUCTIONS   Schedule regular health, dental, and eye exams.  Stay current with your immunizations.   Do not use any tobacco products including cigarettes, chewing tobacco, or electronic cigarettes.  If you are pregnant, do not drink alcohol.  If you are  breastfeeding, limit how much and how often you drink alcohol.  Limit alcohol intake to no more than 1 drink per day for nonpregnant women. One drink equals 12 ounces of beer, 5 ounces of wine, or 1 ounces of hard liquor.  Do not use street drugs.  Do not share needles.  Ask your health care provider for help if you need support or information about quitting drugs.  Tell your health care provider if you often feel depressed.  Tell your health care provider if you have ever been abused or do not feel safe at home.   This information is not intended to replace advice given to you by your health care provider. Make sure you discuss any questions you have with your health care provider.   Document Released: 04/15/2011 Document Revised: 10/21/2014 Document Reviewed: 09/01/2013 Elsevier Interactive Patient Education [employer].

## 2016-02-01 NOTE — Assessment & Plan Note (Signed)
Dx by colonoscopy. Has f/u with Dr [name] 03/2016.

## 2016-02-01 NOTE — Addendum Note (Signed)
Addended by: Royann Shivers A on: 02/01/2016 02:17 PM   Modules accepted: Orders

## 2016-02-01 NOTE — Assessment & Plan Note (Signed)
Congratulated on smoking cessation Discussed lung cancer screening - pt interested so will refer.

## 2016-02-01 NOTE — Telephone Encounter (Signed)
ordered. Thanks.

## 2016-02-01 NOTE — Assessment & Plan Note (Signed)
Continue to monitor weight. Attributes weight loss to recent colitis.

## 2016-02-01 NOTE — Progress Notes (Signed)
BP 106/62 mmHg  Pulse 72  Temp(Src) 98.1 F (36.7 C) (Oral)  Ht 5' 1.5" (1.562 m)  Wt 111 lb 12 oz (50.689 kg)  BMI 20.78 kg/m2  SpO2 94%   CC: CPE  Subjective:    Patient ID: Lindsay Mason, female    DOB: 11-17-1950, 65 y.o.   MRN: [id number]  HPI: Lindsay Mason is a 65 y.o. female presenting on 02/01/2016 for Annual Exam   15 lb weight loss - attributes to colitis. Endorses 98% better. Was taking imodium - now weaned off. Diarrheal episode once weekly.   Off fosamax over last month due to upcoming dental surgery next week. Requests rpt dexa. Prior on fosamax regularly for at least 2 yrs.   Preventative: COLONOSCOPY Date: 06/2015 microscopic colitis [name] Lung cancer screening - 45+ PY hx. Interested  Breast cancer screening - mammo 03/2014 WNL. Does breast exams at home. Breast exam today.  Well woman exam - last well woman exam with Billie Bean, prior to this Dr Hetty Blend.  DEXA Date: 02/2014 T score osteopenia hip, osteoporosis spine, scoliosis. Good calcium in diet. Avoids dairy but eats greek yogurt. Last vit D 72 (2012). Regularly gets bone beraing exercises.  Flu shot - yearly Tetanus shot - 2005, update Tdap today Pneumovax 12/2011, thinks had another at pharmacy. Shingles shot - 2014 Advanced directive discussion - did not discuss Seat belt use discussed Sunscreen use and skin screen discussed   Caffeine: 1 cup coffee/day Lives with husband, outside dog Smoking vapes. Pt tans regularly - at her pool and in her garden. Wears sunscreen  Relevant past medical, surgical, family and social history reviewed and updated as indicated. Interim medical history since our last visit reviewed. Allergies and medications reviewed and updated. Current Outpatient Prescriptions on File Prior to Visit  Medication Sig  . alendronate (FOSAMAX) 70 MG tablet TAKE 1 TABLET BY MOUTH EVERY 7 DAYS, TAKE WITH A FULL GLASS OF WATER ON A EMPTY STOMACH  . aspirin EC 81 MG tablet Take  81 mg by mouth daily.    . B Complex-Biotin-FA (B-COMPLEX PO) Take 1 tablet by mouth daily.  . Budesonide (UCERIS) 9 MG TB24 Take 9 mg by mouth daily.  . Calcium Carbonate-Vitamin D (CALCIUM 600+D) 600-200 MG-UNIT TABS Take 1 tablet by mouth 2 (two) times daily.    Marland Kitchen loperamide (IMODIUM A-D) 2 MG tablet Take 2 mg by mouth 4 (four) times daily as needed for diarrhea or loose stools.  . Multiple Vitamin (MULTIVITAMIN) tablet Take 1 tablet by mouth daily.    . vitamin B-12 (CYANOCOBALAMIN) 100 MCG tablet Take 100 mcg by mouth daily.     No current facility-administered medications on file prior to visit.    Review of Systems  Constitutional: Negative for fever, chills, activity change, appetite change, fatigue and unexpected weight change.  HENT: Negative for hearing loss.   Eyes: Negative for visual disturbance.  Respiratory: Positive for chest tightness (occasional). Negative for cough, shortness of breath and wheezing.   Cardiovascular: Negative for chest pain, palpitations and leg swelling.  Gastrointestinal: Positive for abdominal pain (occasional due to colitis). Negative for nausea, vomiting, diarrhea, constipation, blood in stool and abdominal distention.  Genitourinary: Negative for hematuria and difficulty urinating.  Musculoskeletal: Negative for myalgias, arthralgias and neck pain.  Skin: Negative for rash.  Neurological: Negative for dizziness, seizures, syncope and headaches.  Hematological: Negative for adenopathy. Does not bruise/bleed easily.  Psychiatric/Behavioral: Negative for dysphoric mood. The patient is not nervous/anxious.  Per HPI unless specifically indicated in ROS section     Objective:    BP 106/62 mmHg  Pulse 72  Temp(Src) 98.1 F (36.7 C) (Oral)  Ht 5' 1.5" (1.562 m)  Wt 111 lb 12 oz (50.689 kg)  BMI 20.78 kg/m2  SpO2 94%  Wt Readings from Last 3 Encounters:  02/01/16 111 lb 12 oz (50.689 kg)  06/27/15 126 lb (57.153 kg)  06/15/15 126 lb (57.153  kg)    Physical Exam  Constitutional: She is oriented to person, place, and time. She appears well-developed and well-nourished. No distress.  HENT:  Head: Normocephalic and atraumatic.  Right Ear: Hearing, tympanic membrane, external ear and ear canal normal.  Left Ear: Hearing, tympanic membrane, external ear and ear canal normal.  Nose: Nose normal.  Mouth/Throat: Uvula is midline, oropharynx is clear and moist and mucous membranes are normal. No oropharyngeal exudate, posterior oropharyngeal edema or posterior oropharyngeal erythema.  Eyes: Conjunctivae and EOM are normal. Pupils are equal, round, and reactive to light. No scleral icterus.  Neck: Normal range of motion. Neck supple. No thyromegaly present.  Cardiovascular: Normal rate, regular rhythm, normal heart sounds and intact distal pulses.   No murmur heard. Pulses:      Radial pulses are 2+ on the right side, and 2+ on the left side.  Pulmonary/Chest: Effort normal and breath sounds normal. No respiratory distress. She has no wheezes. She has no rales. Right breast exhibits no inverted nipple, no mass, no nipple discharge, no skin change and no tenderness. Left breast exhibits no inverted nipple, no mass, no nipple discharge, no skin change and no tenderness.  Abdominal: Soft. Bowel sounds are normal. She exhibits no distension and no mass. There is no tenderness. There is no rebound and no guarding.  Musculoskeletal: Normal range of motion. She exhibits no edema.  Lymphadenopathy:       Head (right side): No submental, no submandibular, no tonsillar, no preauricular and no posterior auricular adenopathy present.       Head (left side): No submental, no submandibular, no tonsillar, no preauricular and no posterior auricular adenopathy present.    She has no cervical adenopathy.    She has no axillary adenopathy.       Right axillary: No lateral adenopathy present.       Left axillary: No lateral adenopathy present.      Right:  No supraclavicular adenopathy present.       Left: No supraclavicular adenopathy present.  Neurological: She is alert and oriented to person, place, and time.  CN grossly intact, station and gait intact  Skin: Skin is warm and dry. No rash noted.  Psychiatric: She has a normal mood and affect. Her behavior is normal. Judgment and thought content normal.  Nursing note and vitals reviewed.  Results for orders placed or performed in visit on 01/25/16  Lipid panel  Result Value Ref Range   Cholesterol 188 0 - 200 mg/dL   Triglycerides 40.0 0.0 - 149.0 mg/dL   HDL 90.60 >39.00 mg/dL   VLDL 8.0 0.0 - 40.0 mg/dL   LDL Cholesterol 89 0 - 99 mg/dL   Total CHOL/HDL Ratio 2    NonHDL 96.93   TSH  Result Value Ref Range   TSH 2.44 0.35 - 4.50 uIU/mL  Basic metabolic panel  Result Value Ref Range   Sodium 141 135 - 145 mEq/L   Potassium 4.1 3.5 - 5.1 mEq/L   Chloride 107 96 - 112 mEq/L   CO2  28 19 - 32 mEq/L   Glucose, Bld 99 70 - 99 mg/dL   BUN 26 (H) 6 - 23 mg/dL   Creatinine, Ser 0.62 0.40 - 1.20 mg/dL   Calcium 9.7 8.4 - 10.5 mg/dL   GFR 102.70 >60.00 mL/min      Assessment & Plan:   Problem List Items Addressed This Visit    Ex-smoker    Congratulated on smoking cessation Discussed lung cancer screening - pt interested so will refer.      Relevant Orders   Ambulatory Referral for Lung Cancer Scre   Osteoporosis    Update DEXA. Off fosamax for 1 month (pending dental procedure). Has been consistently on med for 2 yrs.  Continues cal/vit D.  Check vit D next labwork. Difficulty with dairy 2/2 colitis.       Left groin pain   Microscopic colitis    Dx by colonoscopy. Has f/u with Dr [name] 03/2016.      Health maintenance examination - Primary    Preventative protocols reviewed and updated unless pt declined. Discussed healthy diet and lifestyle.       Loss of weight    Continue to monitor weight. Attributes weight loss to recent colitis.      RESOLVED: HTN  (hypertension)    Resolved with weight loss.       Other Visit Diagnoses    Breast cancer screening        Relevant Orders    MM Digital Screening        Follow up plan: Return in about 1 year (around 01/31/2017), or as needed, for annual exam, prior fasting for blood work.  Ria Bush, MD

## 2016-02-01 NOTE — Assessment & Plan Note (Signed)
Update DEXA. Off fosamax for 1 month (pending dental procedure). Has been consistently on med for 2 yrs.  Continues cal/vit D.  Check vit D next labwork. Difficulty with dairy 2/2 colitis.

## 2016-02-01 NOTE — Assessment & Plan Note (Signed)
Preventative protocols reviewed and updated unless pt declined. Discussed healthy diet and lifestyle.  

## 2016-02-01 NOTE — Assessment & Plan Note (Signed)
Resolved with weight loss.   

## 2016-02-01 NOTE — Telephone Encounter (Signed)
Need bone density order

## 2016-02-01 NOTE — Progress Notes (Signed)
Pre visit review using our clinic review tool, if applicable. No additional management support is needed unless otherwise documented below in the visit note. 

## 2016-02-02 ENCOUNTER — Other Ambulatory Visit: Payer: Self-pay | Admitting: Family Medicine

## 2016-02-02 DIAGNOSIS — Z1231 Encounter for screening mammogram for malignant neoplasm of breast: Secondary | ICD-10-CM

## 2016-02-02 NOTE — Telephone Encounter (Signed)
Left message asking pt to call office Does she want a 3d mammogram like she had last year. If so there will be a $50 surcharge due at appointment time

## 2016-02-02 NOTE — Telephone Encounter (Signed)
Mammogram 5/9 @ [facility] Bone density 5/9 [place] Pt aware

## 2016-02-08 ENCOUNTER — Other Ambulatory Visit: Payer: Self-pay | Admitting: Acute Care

## 2016-02-08 DIAGNOSIS — Z87891 Personal history of nicotine dependence: Secondary | ICD-10-CM

## 2016-02-12 DIAGNOSIS — I700 Atherosclerosis of aorta: Secondary | ICD-10-CM | POA: Insufficient documentation

## 2016-02-12 DIAGNOSIS — J439 Emphysema, unspecified: Secondary | ICD-10-CM | POA: Insufficient documentation

## 2016-02-12 HISTORY — DX: Atherosclerosis of aorta: I70.0

## 2016-02-12 HISTORY — DX: Emphysema, unspecified: J43.9

## 2016-02-16 ENCOUNTER — Ambulatory Visit: Payer: BLUE CROSS/BLUE SHIELD

## 2016-02-20 ENCOUNTER — Ambulatory Visit
Admission: RE | Admit: 2016-02-20 | Discharge: 2016-02-20 | Disposition: A | Discharge status: Home / Self Care | Payer: Medicare HMO | Source: Ambulatory Visit | Attending: Family Medicine | Admitting: Family Medicine

## 2016-02-20 ENCOUNTER — Ambulatory Visit: Payer: BLUE CROSS/BLUE SHIELD

## 2016-02-20 DIAGNOSIS — Z1231 Encounter for screening mammogram for malignant neoplasm of breast: Secondary | ICD-10-CM

## 2016-02-20 LAB — HM MAMMOGRAPHY

## 2016-02-21 ENCOUNTER — Encounter: Payer: Self-pay | Admitting: *Deleted

## 2016-02-22 ENCOUNTER — Encounter: Payer: Self-pay | Admitting: Acute Care

## 2016-02-22 ENCOUNTER — Ambulatory Visit (INDEPENDENT_AMBULATORY_CARE_PROVIDER_SITE_OTHER): Payer: Medicare HMO | Admitting: Acute Care

## 2016-02-22 ENCOUNTER — Ambulatory Visit (INDEPENDENT_AMBULATORY_CARE_PROVIDER_SITE_OTHER)
Admission: RE | Admit: 2016-02-22 | Discharge: 2016-02-22 | Disposition: A | Discharge status: Home / Self Care | Payer: Medicare HMO | Source: Ambulatory Visit | Attending: Acute Care | Admitting: Acute Care

## 2016-02-22 DIAGNOSIS — Z87891 Personal history of nicotine dependence: Secondary | ICD-10-CM

## 2016-02-22 NOTE — Progress Notes (Signed)
Shared Decision Making Visit Lung Cancer Screening Program [phone])   Eligibility:  Age 65 y.o.  Pack Years Smoking History Calculation 30 pack year smoking history (# packs/per year x # years smoked)  Recent History of coughing up blood  No  Unexplained weight loss? No ( >Than 15 pounds within the last 6 months )  Prior History Lung / other cancer No (Diagnosis within the last 5 years already requiring surveillance chest CT Scans).  Smoking Status : Former Smoker  Former Smokers: Years since quit:<1 year  Quit Date: Oct. 2016  Visit Components:  Discussion included one or more decision making aids. yes  Discussion included risk/benefits of screening. yes  Discussion included potential follow up diagnostic testing for abnormal scans. yes  Discussion included meaning and risk of over diagnosis. yes  Discussion included meaning and risk of False Positives. yes  Discussion included meaning of total radiation exposure. yes  Counseling Included:  Importance of adherence to annual lung cancer LDCT screening. yes  Impact of comorbidities on ability to participate in the program. yes  Ability and willingness to under diagnostic treatment. yes  Smoking Cessation Counseling:  Current Smokers:   Discussed importance of smoking cessation. NA  Information about tobacco cessation classes and interventions provided to patient. NA  Patient provided with "ticket" for LDCT Scan. yes  Symptomatic Patient. no  Counseling  Diagnosis Code: Tobacco Use Z72.0  Asymptomatic Patient yes  Counseling NA: former smoker  Former Smokers:   Discussed the importance of maintaining cigarette abstinence. yes  Diagnosis Code: Personal History of Nicotine Dependence. D40.814  Information about tobacco cessation classes and interventions provided to patient. Yes  Patient provided with "ticket" for LDCT Scan. yes  Written Order for Lung Cancer Screening with LDCT placed in Epic.  Yes (CT Chest Lung Cancer Screening Low Dose W/O CM) [id number] Z12.2-Screening of respiratory organs Z87.891-Personal history of nicotine dependence  I spent 15 minutes of face to face time with Lindsay Mason discussing the risks and benefits of lung cancer screening. We viewed a power point together that explained in detail the above noted topics. We took the time to pause the power point at intervals to allow for questions to be asked and answered to ensure understanding. We discussed that she had taken the single most powerful action possible to decrease her risk of developing lung cancer when she quit smoking. I counseled her to remain smoke free, and to contact me if she ever had the desire to smoke again so that I can provide resources and tools to help support the effort to remain smoke free. We discussed the time and location of the scan, and that either [place] or I will call with the results within  24-48 hours of receiving them. She  has my card and contact information in the event she needs to speak with me, in addition to a copy of the power point we reviewed as a resource. She verbalized understanding of all of the above and had no further questions upon leaving the office.     Lindsay Spatz, NP  02/22/2016

## 2016-02-23 ENCOUNTER — Encounter: Payer: Self-pay | Admitting: Family Medicine

## 2016-02-23 ENCOUNTER — Telehealth: Payer: Self-pay | Admitting: Acute Care

## 2016-02-23 DIAGNOSIS — Z87891 Personal history of nicotine dependence: Secondary | ICD-10-CM

## 2016-02-23 NOTE — Telephone Encounter (Signed)
I called Lindsay Mason with her low-dose CT screening results. I explained to her that her scan was read as a Lung RADS 2: nodules that are benign in appearance and behavior with a very low likelihood of becoming a clinically active cancer due to size or lack of growth. Recommendation per radiology is for a repeat LDCT in 12 months. I told her we would schedule her in 12 months for her annual CT scan. I also let her know that I will forward the results of the scan to her primary care doctor for her medical record. She had no further questions upon completing the call, but has my contact information in the event she has an questions in the future.

## 2016-02-28 ENCOUNTER — Other Ambulatory Visit (HOSPITAL_COMMUNITY)
Admission: RE | Admit: 2016-02-28 | Discharge: 2016-02-28 | Disposition: A | Discharge status: Home / Self Care | Payer: Medicare HMO | Source: Ambulatory Visit | Attending: Primary Care | Admitting: Primary Care

## 2016-02-28 ENCOUNTER — Encounter: Payer: Self-pay | Admitting: Primary Care

## 2016-02-28 ENCOUNTER — Ambulatory Visit (INDEPENDENT_AMBULATORY_CARE_PROVIDER_SITE_OTHER): Payer: Medicare HMO | Admitting: Primary Care

## 2016-02-28 VITALS — BP 104/64 | HR 69 | Temp 98.0°F | Ht 62.0 in | Wt 111.8 lb

## 2016-02-28 DIAGNOSIS — Z124 Encounter for screening for malignant neoplasm of cervix: Secondary | ICD-10-CM | POA: Diagnosis not present

## 2016-02-28 DIAGNOSIS — Z01419 Encounter for gynecological examination (general) (routine) without abnormal findings: Secondary | ICD-10-CM | POA: Diagnosis present

## 2016-02-28 DIAGNOSIS — Z1151 Encounter for screening for human papillomavirus (HPV): Secondary | ICD-10-CM | POA: Insufficient documentation

## 2016-02-28 NOTE — Progress Notes (Signed)
Pre visit review using our clinic review tool, if applicable. No additional management support is needed unless otherwise documented below in the visit note. 

## 2016-02-28 NOTE — Patient Instructions (Signed)
Either Dr. Danise Mina or myself will call you with the results of your PAP. Please allow 2-3 days for the lab to process your specimen.   It was a pleasure meeting you!

## 2016-02-28 NOTE — Progress Notes (Signed)
Subjective:    Patient ID: Lindsay Mason, female    DOB: 1951/03/23, 65 y.o.   MRN: [id number]  HPI  Lindsay Mason is a 65 year old female who presents today for Pap only. She completed an annual exam with her PCP in late April 2017.  Mammogram: Completed in May 2017, normal.  Pap: Completed 8-9 years ago. Denies history of abnormal pap.  She denies vaginal bleeding, vaginal discharge, urinary symptoms, abdominal pain. She was a prior smoker with recent tobacco cessation.   Review of Systems  Respiratory: Negative for shortness of breath.   Cardiovascular: Negative for chest pain.  Genitourinary: Negative for dysuria, vaginal discharge and pelvic pain.       Past Medical History  Diagnosis Date  . Osteoporosis, unspecified   . Ex-smoker 08/2014    currently using e cig  . Chest pain     normal ETT 10/2011  . CAP (community acquired pneumonia) 12/2011  . Adrenal hyperplasia ([place]) 12/2011    on CT  . Scoliosis of lumbar spine   . Allergy   . Microscopic colitis 06/2015    lymphocytic by colonoscopy - started entocort [name]  . 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 Marital Status: Married    Spouse Name: N/A  . Number of Children: 3  . Years of Education: N/A   Occupational History  . retired    Social History Main Topics  . Smoking status: Former Smoker -- 1.00 packs/day for 30 years    Types: Cigarettes    Start date: 10/14/1978    Quit date: 07/15/2015  . Smokeless tobacco: Never Used     Comment: off and on smoker, now e-cigarettes  . Alcohol Use: 0.0 oz/week    0 Standard drinks or equivalent per week     Comment: social wine once a week  . Drug Use: No  . Sexual Activity: Not on file   Other Topics Concern  . Not on file   Social History Narrative   Caffeine: 1 cup coffee/day   Lives with husband, outside  dog   Smoking vapes.   Pt tans regularly - at her pool and in her garden. Wears sunscreen    Past Surgical History  Procedure Laterality Date  . Vein removal      removed from legs  . Dexa  10/2008    Osteoporosis  . Tonsillectomy and adenoidectomy    . Exploratory laparotomy    . Mri head  07/2011    multiple foci deep and subcortical white matter, chronic ischemic vs demyelinating, no active disease  . Ett  10/2011    WNL, no evidence of ischemia, excellent exercise tolerance  . Dexa  02/2014    T score osteopenia hip, osteoporosis spine, scoliosis  . Colonoscopy  06/2015    microscopic colitis [name]    Family History  Problem Relation Age of Onset  . Osteoporosis      great grandmother  . Stroke Maternal Grandmother   . Coronary artery disease Maternal Grandfather 50  . Diabetes Maternal Grandfather   . Coronary artery disease Paternal Grandmother 80  . Coronary artery disease Paternal Grandfather 61  . Lung cancer Maternal Aunt     Allergies  Allergen Reactions  . Penicillins     REACTION: swelling tongue -  States has taken Cephalosporins without difficulty in past    Current Outpatient Prescriptions on File Prior to Visit  Medication Sig Dispense Refill  . Apoaequorin (PREVAGEN PO) Take 1 capsule by mouth daily.    Marland Kitchen aspirin EC 81 MG tablet Take 81 mg by mouth daily.      . B Complex-Biotin-FA (B-COMPLEX PO) Take 1 tablet by mouth daily.    . Calcium Carbonate-Vitamin D (CALCIUM 600+D) 600-200 MG-UNIT TABS Take 1 tablet by mouth 2 (two) times daily.      Marland Kitchen loperamide (IMODIUM A-D) 2 MG tablet Take 2 mg by mouth 4 (four) times daily as needed for diarrhea or loose stools.    . Multiple Vitamin (MULTIVITAMIN) tablet Take 1 tablet by mouth daily.      Marland Kitchen OVER THE COUNTER MEDICATION Take 1 capsule by mouth daily. SEROVITAL [place]    . vitamin B-12 (CYANOCOBALAMIN) 100 MCG tablet Take 100 mcg by mouth daily.       No current facility-administered medications on file  prior to visit.    BP 104/64 mmHg  Pulse 69  Temp(Src) 98 F (36.7 C) (Oral)  Ht '5\' 2"'$  (1.575 m)  Wt 111 lb 12.8 oz (50.712 kg)  BMI 20.44 kg/m2  SpO2 98%    Objective:   Physical Exam  Constitutional: She appears well-nourished.  Cardiovascular: Normal rate and regular rhythm.   Pulmonary/Chest: Effort normal and breath sounds normal.  Genitourinary: Cervix exhibits no motion tenderness and no discharge. No vaginal discharge found.  Mild bleeding noted around cervical os prior to Pap exam. Non tender. No lesions.   Skin: Skin is warm and dry.          Assessment & Plan:  Screening for Cervical Cancer:  Prior smoker, recent tobacco cessation. Pelvic exam today mostly unremarkable with the exception of mild bleeding around the cervical os. No lesions or other discharge. No CMT. Pap completed and specimen sent off for testing. Patient without pain or discomfort during exam.  Recent lung cancer screening which was unremarkable.

## 2016-02-29 LAB — CYTOLOGY - PAP

## 2016-11-09 IMAGING — MG MM SCREENING BREAST TOMO BILATERAL
9 of 13 series · 9 of 29 positions shown · non-contrast
Comparison: Previous exam(s).

CLINICAL DATA: Screening.

EXAM:
2D DIGITAL SCREENING BILATERAL MAMMOGRAM WITH CAD AND ADJUNCT TOMO

[L MLO (1 of 2)]
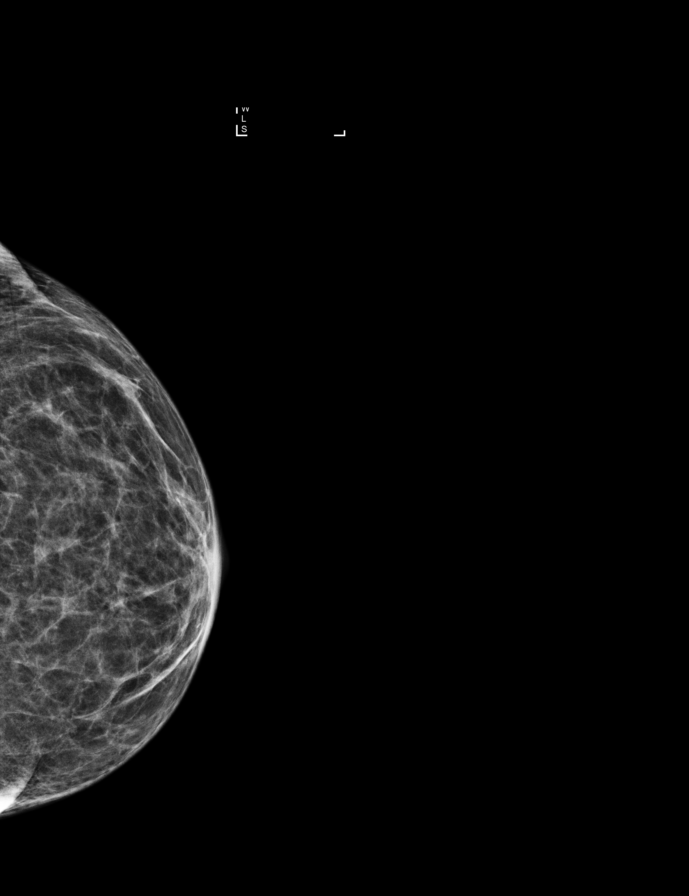

[L CC synth-2D]
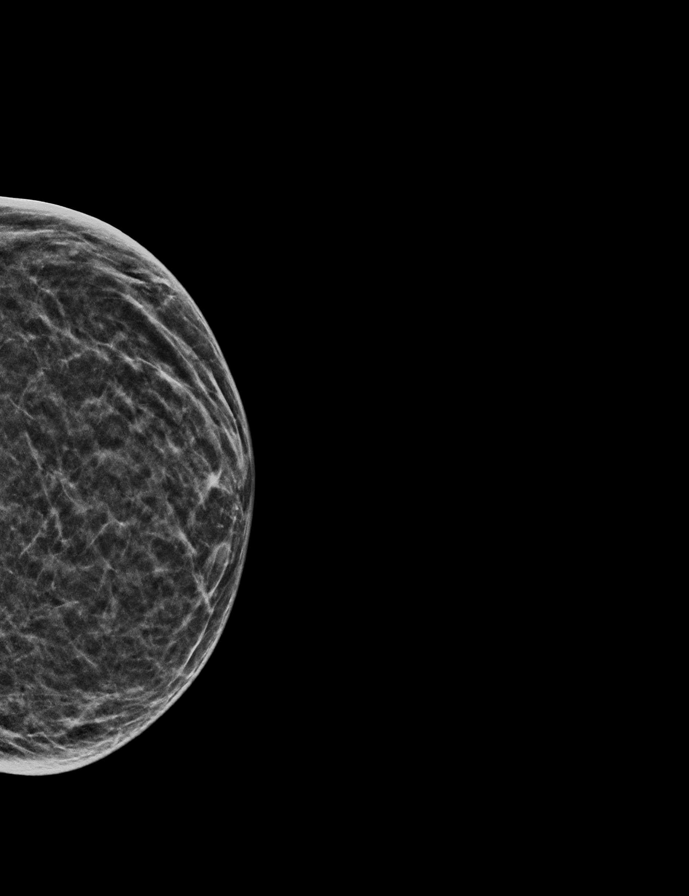

[R MLO]
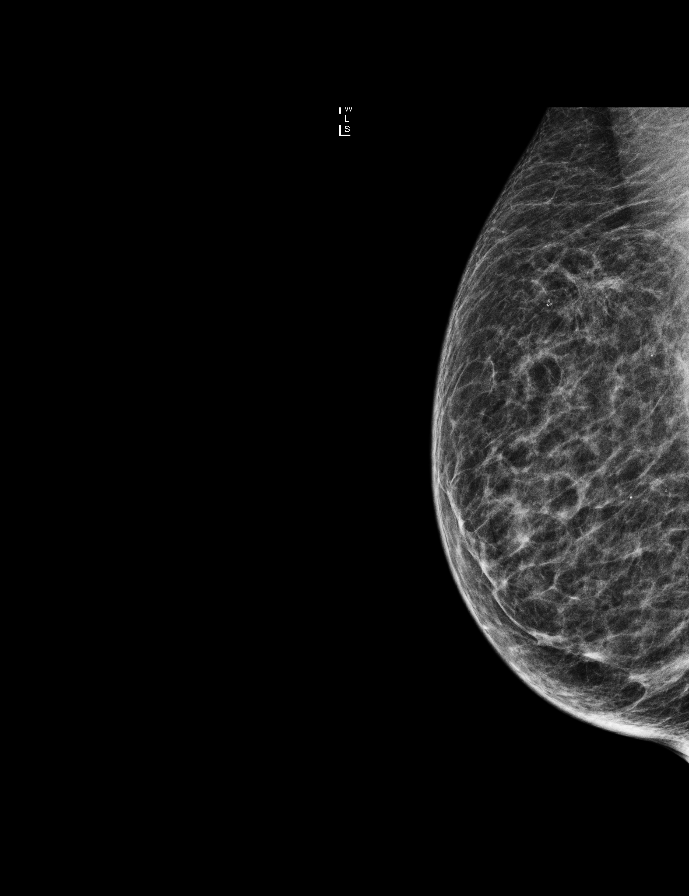

[R MLO synth-2D]
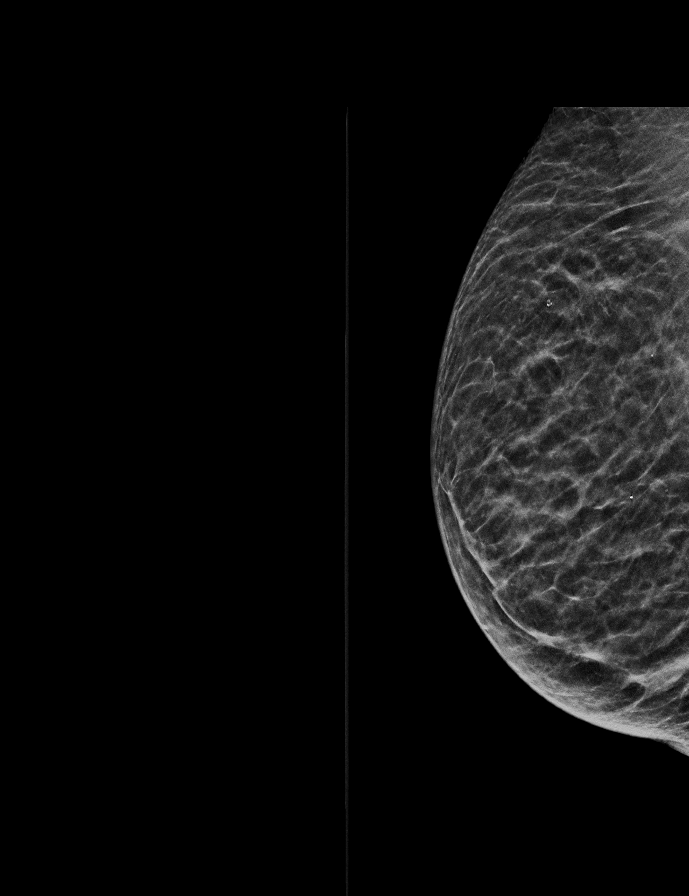

[R CC synth-2D]
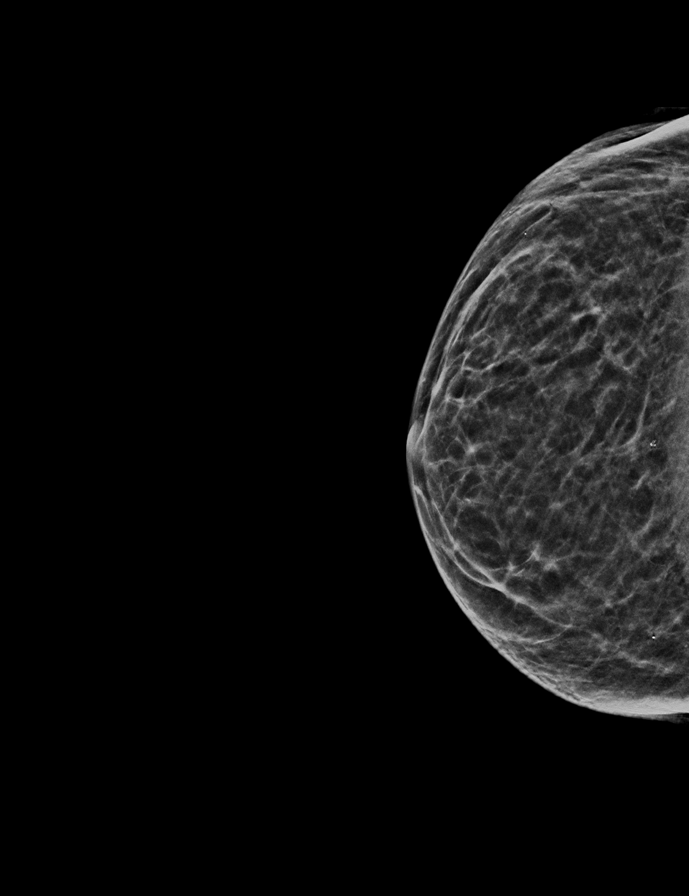

[L CC]
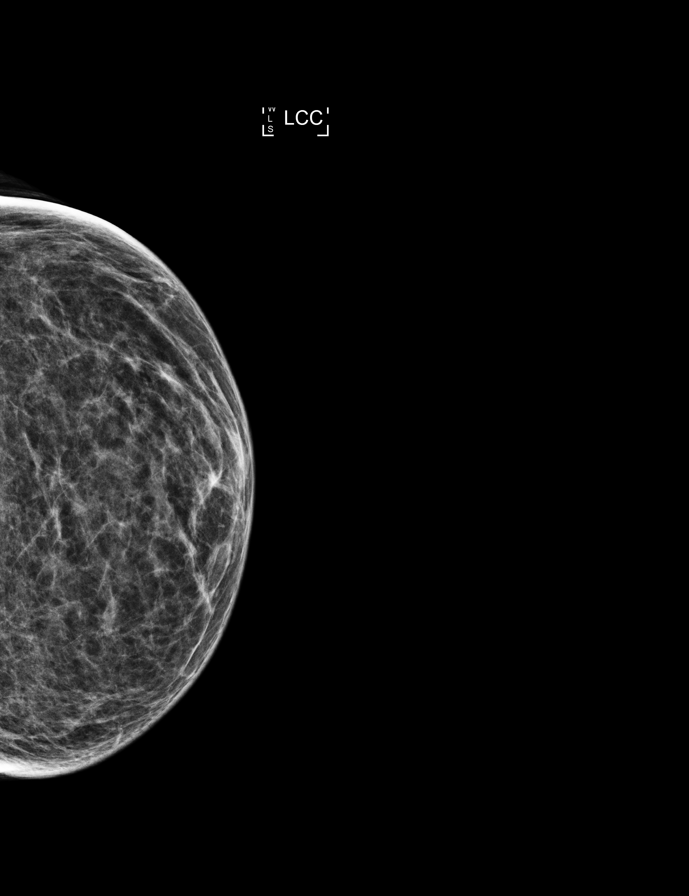

[L MLO (2 of 2)]
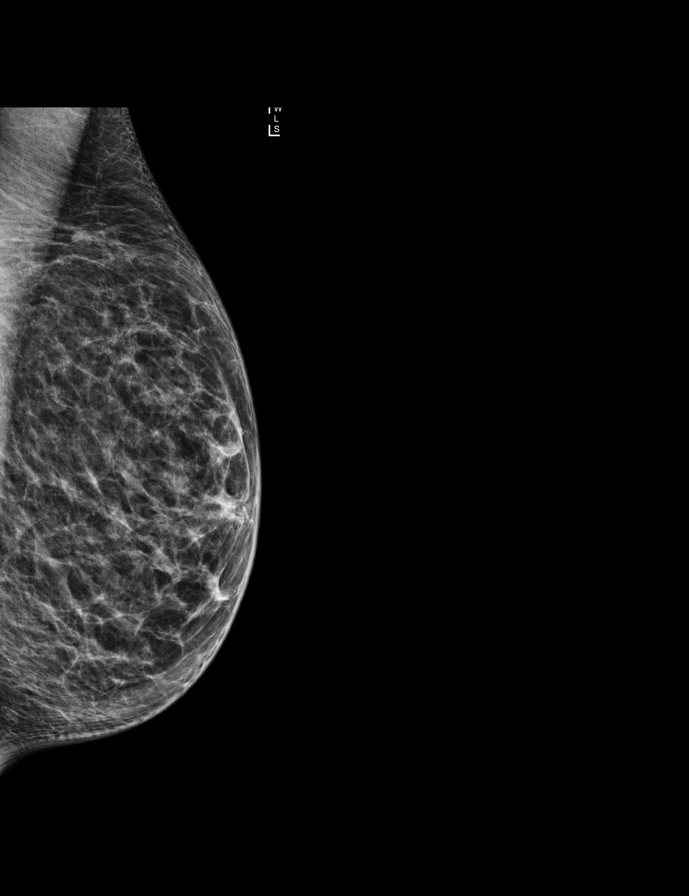

[R CC]
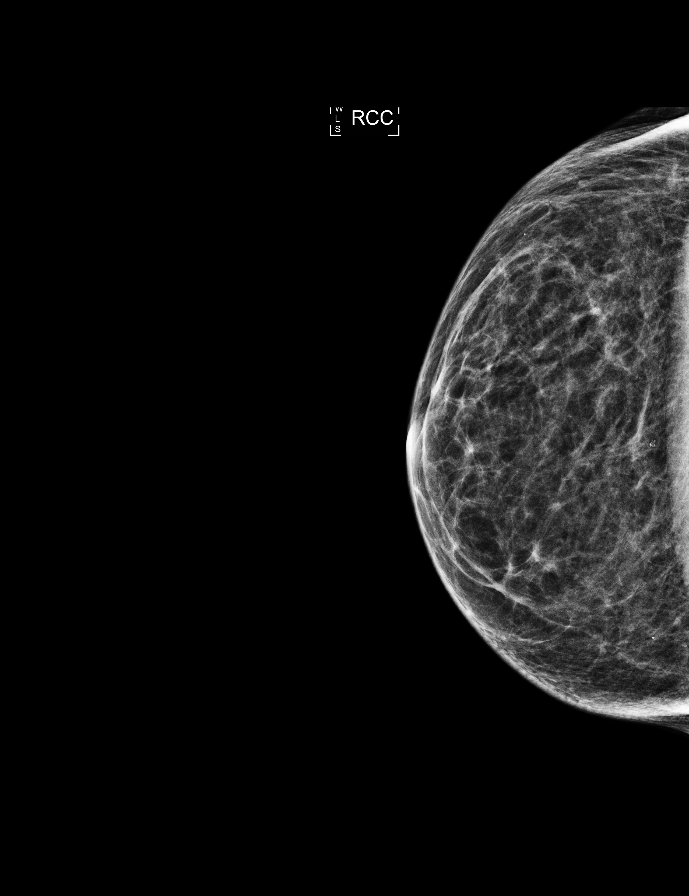

[L MLO synth-2D]
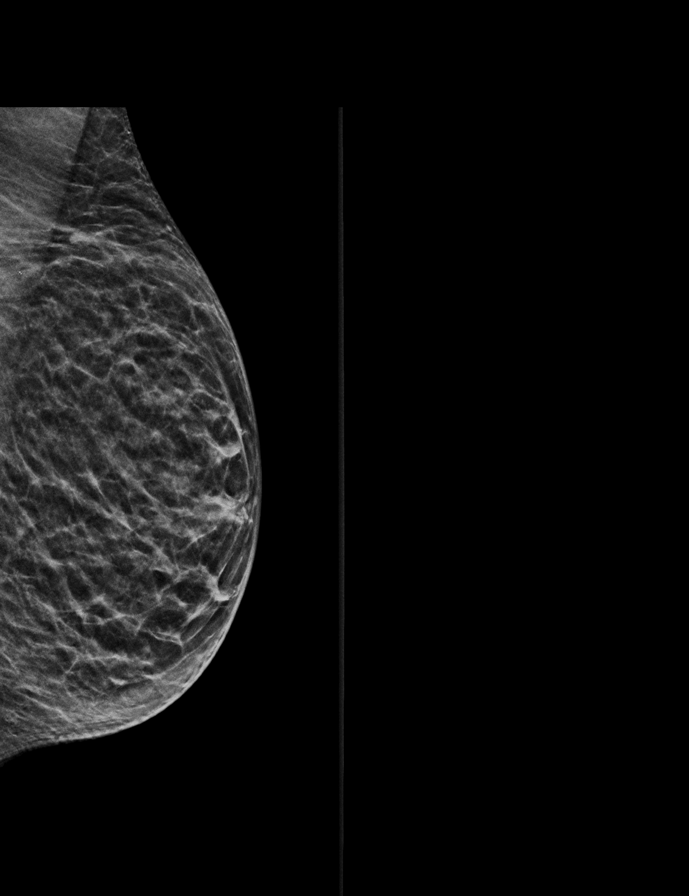

[9 of 29 positions shown; findings below may reference images not displayed]

ACR Breast Density Category b: There are scattered areas of
fibroglandular density.
FINDINGS: There are no findings suspicious for malignancy. Images were
processed with CAD.
IMPRESSION: No mammographic evidence of malignancy. A result letter of this
screening mammogram will be mailed directly to the patient.

RECOMMENDATION:
Screening mammogram in one year. (Code:97-6-RS4)

BI-RADS CATEGORY  1: Negative.

## 2017-02-24 ENCOUNTER — Encounter: Payer: Self-pay | Admitting: Acute Care

## 2017-04-01 ENCOUNTER — Telehealth: Payer: Self-pay

## 2017-04-01 NOTE — Telephone Encounter (Signed)
Patient is on the list for [employer] 2018 and may be a good candidate for an AWV. Please let me know if/when appt is scheduled.   

## 2017-04-01 NOTE — Telephone Encounter (Signed)
Left pt message asking to call Ebony Hail back directly at [phone] to schedule AWV with [name].  *Note* CPE was completed on 02/27/17

## 2017-05-09 NOTE — Telephone Encounter (Signed)
Left pt message asking to call Ebony Hail back directly at [phone] to schedule AWV + labs with [name] and CPE with PCP.  **NOTE CORRECTION** Pt has not had AWV or CPE for 2018  Last CPE 02/28/16

## 2017-05-23 NOTE — Telephone Encounter (Signed)
Scheduled 06/11/17

## 2017-06-11 ENCOUNTER — Other Ambulatory Visit: Payer: Self-pay | Admitting: Family Medicine

## 2017-06-11 ENCOUNTER — Ambulatory Visit (INDEPENDENT_AMBULATORY_CARE_PROVIDER_SITE_OTHER): Payer: Medicare HMO

## 2017-06-11 VITALS — BP 108/70 | HR 75 | Temp 98.5°F | Ht 61.5 in | Wt 126.5 lb

## 2017-06-11 DIAGNOSIS — M810 Age-related osteoporosis without current pathological fracture: Secondary | ICD-10-CM | POA: Diagnosis not present

## 2017-06-11 DIAGNOSIS — I700 Atherosclerosis of aorta: Secondary | ICD-10-CM | POA: Diagnosis not present

## 2017-06-11 DIAGNOSIS — Z0000 Encounter for general adult medical examination without abnormal findings: Secondary | ICD-10-CM

## 2017-06-11 DIAGNOSIS — Z23 Encounter for immunization: Secondary | ICD-10-CM

## 2017-06-11 DIAGNOSIS — E2839 Other primary ovarian failure: Secondary | ICD-10-CM

## 2017-06-11 DIAGNOSIS — Z1231 Encounter for screening mammogram for malignant neoplasm of breast: Secondary | ICD-10-CM | POA: Diagnosis not present

## 2017-06-11 DIAGNOSIS — Z1239 Encounter for other screening for malignant neoplasm of breast: Secondary | ICD-10-CM

## 2017-06-11 NOTE — Patient Instructions (Signed)
Ms. Briscoe Deutscher , Thank you for taking time to come for your Medicare Wellness Visit. I appreciate your ongoing commitment to your health goals. Please review the following plan we discussed and let me know if I can assist you in the future.   These are the goals we discussed: Goals    . Increase physical activity          Starting 06/11/2017, I will continue to walk at least 60 min daily.        This is a list of the screening recommended for you and due dates:  Health Maintenance  Topic Date Due  . Flu Shot  01/11/2018*  . Mammogram  02/19/2018  . Pneumonia vaccines (2 of 2 - PPSV23) 06/11/2018  . Colon Cancer Screening  06/26/2025  . DTaP/Tdap/Td vaccine (2 - Td) 01/31/2026  . Tetanus Vaccine  01/31/2026  . DEXA scan (bone density measurement)  Completed  .  Hepatitis C: One time screening is recommended by Center for Disease Control  (CDC) for  adults born from [birth year] through 1965.   Completed  *Topic was postponed. The date shown is not the original due date.   Preventive Care for Adults  A healthy lifestyle and preventive care can promote health and wellness. Preventive health guidelines for adults include the following key practices.  . A routine yearly physical is a good way to check with your health care provider about your health and preventive screening. It is a chance to share any concerns and updates on your health and to receive a thorough exam.  . Visit your dentist for a routine exam and preventive care every 6 months. Brush your teeth twice a day and floss once a day. Good oral hygiene prevents tooth decay and gum disease.  . The frequency of eye exams is based on your age, health, family medical history, use  of contact lenses, and other factors. Follow your health care provider's ecommendations for frequency of eye exams.  . Eat a healthy diet. Foods like vegetables, fruits, whole grains, low-fat dairy products, and lean protein foods contain the nutrients you need  without too many calories. Decrease your intake of foods high in solid fats, added sugars, and salt. Eat the right amount of calories for you. Get information about a proper diet from your health care provider, if necessary.  . Regular physical exercise is one of the most important things you can do for your health. Most adults should get at least 150 minutes of moderate-intensity exercise (any activity that increases your heart rate and causes you to sweat) each week. In addition, most adults need muscle-strengthening exercises on 2 or more days a week.  Silver Sneakers may be a benefit available to you. To determine eligibility, you may visit the website: www.silversneakers.com or contact program at [phone] Mon-Fri between 8AM-8PM.   . Maintain a healthy weight. The body mass index (BMI) is a screening tool to identify possible weight problems. It provides an estimate of body fat based on height and weight. Your health care provider can find your BMI and can help you achieve or maintain a healthy weight.   For adults 20 years and older: ? A BMI below 18.5 is considered underweight. ? A BMI of 18.5 to 24.9 is normal. ? A BMI of 25 to 29.9 is considered overweight. ? A BMI of 30 and above is considered obese.   . Maintain normal blood lipids and cholesterol levels by exercising and minimizing your intake of saturated  fat. Eat a balanced diet with plenty of fruit and vegetables. Blood tests for lipids and cholesterol should begin at age 52 and be repeated every 5 years. If your lipid or cholesterol levels are high, you are over 50, or you are at high risk for heart disease, you may need your cholesterol levels checked more frequently. Ongoing high lipid and cholesterol levels should be treated with medicines if diet and exercise are not working.  . If you smoke, find out from your health care provider how to quit. If you do not use tobacco, please do not start.  . If you choose to drink  alcohol, please do not consume more than 2 drinks per day. One drink is considered to be 12 ounces (355 mL) of beer, 5 ounces (148 mL) of wine, or 1.5 ounces (44 mL) of liquor.  . If you are 24-97 years old, ask your health care provider if you should take aspirin to prevent strokes.  . Use sunscreen. Apply sunscreen liberally and repeatedly throughout the day. You should seek shade when your shadow is shorter than you. Protect yourself by wearing long sleeves, pants, a wide-brimmed hat, and sunglasses year round, whenever you are outdoors.  . Once a month, do a whole body skin exam, using a mirror to look at the skin on your back. Tell your health care provider of new moles, moles that have irregular borders, moles that are larger than a pencil eraser, or moles that have changed in shape or color.

## 2017-06-11 NOTE — Progress Notes (Signed)
Subjective:   Lindsay Mason is a 66 y.o. female who presents for an Initial Medicare Annual Wellness Visit.  Review of Systems    N/A  Cardiac Risk Factors include: advanced age (>53men, >49 women)     Objective:    Today's Vitals   06/11/17 1320 06/11/17 1328  BP: 108/70   Pulse: 75   Temp: 98.5 F (36.9 C)   TempSrc: Oral   SpO2: 96%   Weight: 126 lb 8 oz (57.4 kg)   Height: 5' 1.5" (1.562 m)   PainSc: 0-No pain 0-No pain   Body mass index is 23.51 kg/m.   Current Medications (verified) Outpatient Encounter Prescriptions as of 06/11/2017  Medication Sig  . Apoaequorin (PREVAGEN PO) Take 1 capsule by mouth daily.  Marland Kitchen aspirin EC 81 MG tablet Take 81 mg by mouth daily.    . B Complex-Biotin-FA (B-COMPLEX PO) Take 1 tablet by mouth daily.  . Calcium Carbonate-Vitamin D (CALCIUM 600+D) 600-200 MG-UNIT TABS Take 1 tablet by mouth 2 (two) times daily.    Marland Kitchen loperamide (IMODIUM A-D) 2 MG tablet Take 2 mg by mouth 4 (four) times daily as needed for diarrhea or loose stools.  . Multiple Vitamin (MULTIVITAMIN) tablet Take 1 tablet by mouth daily.    Marland Kitchen OVER THE COUNTER MEDICATION Take 1 capsule by mouth daily. SEROVITAL [place]  . vitamin B-12 (CYANOCOBALAMIN) 100 MCG tablet Take 100 mcg by mouth daily.     No facility-administered encounter medications on file as of 06/11/2017.     Allergies (verified) Penicillins   History: Past Medical History:  Diagnosis Date  . Adrenal hyperplasia ([place]) 12/2011   on CT  . Allergy   . CAP (community acquired pneumonia) 12/2011  . Chest pain    normal ETT 10/2011  . 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 Ex-smoker 08/2014   currently using e cig  . Microscopic colitis 06/2015   lymphocytic by colonoscopy - started entocort [name]  . Osteoporosis, unspecified   . Scoliosis of lumbar spine   . 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 COLONOSCOPY  06/2015   microscopic colitis [name]  . DEXA  10/2008   Osteoporosis  . DEXA  02/2014   T score osteopenia hip, osteoporosis spine, scoliosis  . ETT  10/2011   WNL, no evidence of ischemia, excellent exercise tolerance  . EXPLORATORY LAPAROTOMY    . MRI head  07/2011   multiple foci deep and subcortical white matter, chronic ischemic vs demyelinating, no active disease  . TONSILLECTOMY AND ADENOIDECTOMY    . Vein removal     removed from legs   Family History  Problem Relation Age of Onset  . Osteoporosis Unknown        great grandmother  . Stroke Maternal Grandmother   . Coronary artery disease Maternal Grandfather 50  . Diabetes Maternal Grandfather   . Coronary artery disease Paternal Grandmother 80  . Coronary artery disease Paternal Grandfather 22  . Lung cancer Maternal Aunt    Social History   Occupational History  . retired    Social History Main Topics  . Smoking status: Former Smoker    Packs/day: 1.00    Years: 30.00    Types: Cigarettes    Start date: 10/14/1978    Quit date: 07/15/2015  . Smokeless tobacco: Never Used  Comment: off and on smoker, now e-cigarettes  . Alcohol use 0.0 oz/week     Comment: social wine once a week  . Drug use: No  . Sexual activity: Not on file    Tobacco Counseling Counseling given: No   Activities of Daily Living In your present state of health, do you have any difficulty performing the following activities: 06/11/2017  Hearing? N  Vision? N  Difficulty concentrating or making decisions? N  Walking or climbing stairs? N  Dressing or bathing? N  Doing errands, shopping? N  Preparing Food and eating ? N  Using the Toilet? N  In the past six months, have you accidently leaked urine? N  Do you have problems with loss of bowel control? N  Managing your Medications? N  Managing your Finances? N  Housekeeping or managing your Housekeeping? N  Some recent data might be hidden     Immunizations and Health Maintenance Immunization History  Administered Date(s) Administered  . Influenza Whole 07/19/2008, 08/24/2012  . Influenza, Seasonal, Injecte, Preservative Fre 07/13/2013  . Influenza,inj,Quad PF,6+ Mos 08/22/2015  . Influenza-Unspecified 08/16/2016  . Pneumococcal Conjugate-13 06/11/2017  . Pneumococcal Polysaccharide-23 01/01/2012  . Td 04/04/2004  . Tdap 02/01/2016  . Zoster 10/13/2013   There are no preventive care reminders to display for this patient.  Patient Care Team: Ria Bush, MD as PCP - General (Family Medicine)  Indicate any recent Medical Services you may have received from other than [facility] providers in the past year (date may be approximate).     Assessment:   This is a routine wellness examination for [name].  Hearing/Vision screen  Hearing Screening   125Hz  250Hz  500Hz  1000Hz  2000Hz  3000Hz  4000Hz  6000Hz  8000Hz   Right ear:   40 40 40  40    Left ear:   40 40 40  40    Vision Screening Comments: Last vision exam in March 2018  Dietary issues and exercise activities discussed: Current Exercise Habits: Home exercise routine, Type of exercise: walking;treadmill, Time (Minutes): 60, Frequency (Times/Week): 7, Weekly Exercise (Minutes/Week): 420, Intensity: Moderate, Exercise limited by: None identified  Goals    . Increase physical activity          Starting 06/11/2017, I will continue to walk at least 60 min daily.       Depression Screen PHQ 2/9 Scores 06/11/2017  PHQ - 2 Score 0  PHQ- 9 Score 0    Fall Risk Fall Risk  06/11/2017  Falls in the past year? No    Cognitive Function: MMSE - Mini Mental State Exam 06/11/2017  Orientation to time 5  Orientation to Place 5  Registration 3  Attention/ Calculation 0  Recall 3  Language- name 2 objects 0  Language- repeat 1  Language- follow 3 step command 3  Language- read & follow direction 0  Write a sentence 0  Copy design 0  Total score 20     PLEASE NOTE: A  Mini-Cog screen was completed. Maximum score is 20. A value of 0 denotes this part of [place] MMSE was not completed or the patient failed this part of the Mini-Cog screening.   Mini-Cog Screening Orientation to Time - Max 5 pts Orientation to Place - Max 5 pts Registration - Max 3 pts Recall - Max 3 pts Language Repeat - Max 1 pts Language Follow 3 Step Command - Max 3 pts     Screening Tests Health Maintenance  Topic Date Due  . INFLUENZA VACCINE  01/11/2018 (  Originally 05/14/2017)  . MAMMOGRAM  02/19/2018  . PNA vac Low Risk Adult (2 of 2 - PPSV23) 06/11/2018  . COLONOSCOPY  06/26/2025  . DTaP/Tdap/Td (2 - Td) 01/31/2026  . TETANUS/TDAP  01/31/2026  . DEXA SCAN  Completed  . Hepatitis C Screening  Completed      Plan:  I have personally reviewed and addressed the Medicare Annual Wellness questionnaire and have noted the following in the patient's chart:  A. Medical and social history B. Use of alcohol, tobacco or illicit drugs  C. Current medications and supplements D. Functional ability and status E.  Nutritional status F.  Physical activity G. Advance directives H. List of other physicians I.  Hospitalizations, surgeries, and ER visits in previous 12 months J.  [place] to include hearing, vision, cognitive, depression L. Referrals and appointments - none  In addition, I have reviewed and discussed with patient certain preventive protocols, quality metrics, and best practice recommendations. A written personalized care plan for preventive services as well as general preventive health recommendations were provided to patient.  See attached scanned questionnaire for additional information.   Signed,   Lindell Noe, MHA, BS, LPN Health Coach

## 2017-06-11 NOTE — Progress Notes (Signed)
Pre visit review using our clinic review tool, if applicable. No additional management support is needed unless otherwise documented below in the visit note. 

## 2017-06-11 NOTE — Progress Notes (Signed)
PCP notes:   Health maintenance:  Flu vaccine - addressed  Abnormal screenings:   None  Patient concerns:   None  Nurse concerns:  None  Next PCP appt:   06/26/17 @ 1130

## 2017-06-12 ENCOUNTER — Other Ambulatory Visit (INDEPENDENT_AMBULATORY_CARE_PROVIDER_SITE_OTHER): Payer: Medicare HMO

## 2017-06-12 DIAGNOSIS — M810 Age-related osteoporosis without current pathological fracture: Secondary | ICD-10-CM | POA: Diagnosis not present

## 2017-06-12 DIAGNOSIS — Z0000 Encounter for general adult medical examination without abnormal findings: Secondary | ICD-10-CM | POA: Diagnosis not present

## 2017-06-12 LAB — BASIC METABOLIC PANEL
BUN: 24 mg/dL — ABNORMAL HIGH (ref 6–23)
CHLORIDE: 109 meq/L (ref 96–112)
CO2: 29 mEq/L (ref 19–32)
CREATININE: 0.59 mg/dL (ref 0.40–1.20)
Calcium: 9 mg/dL (ref 8.4–10.5)
GFR: 108.28 mL/min (ref >60.00)
Glucose, Bld: 87 mg/dL (ref 70–99)
Potassium: 4.7 mEq/L (ref 3.5–5.1)
SODIUM: 141 meq/L (ref 135–145)

## 2017-06-12 LAB — LIPID PANEL
CHOLESTEROL: 169 mg/dL (ref 0–200)
HDL: 74 mg/dL (ref >39.00)
LDL CALC: 85 mg/dL (ref 0–99)
NonHDL: 95.02
TRIGLYCERIDES: 48 mg/dL (ref 0.0–149.0)
Total CHOL/HDL Ratio: 2
VLDL: 9.6 mg/dL (ref 0.0–40.0)

## 2017-06-12 LAB — VITAMIN D 25 HYDROXY (VIT D DEFICIENCY, FRACTURES): VITD: 42.26 ng/mL (ref 30.00–100.00)

## 2017-06-19 NOTE — Progress Notes (Signed)
I reviewed health advisor's note, was available for consultation, and agree with documentation and plan.  

## 2017-06-24 ENCOUNTER — Encounter: Payer: Self-pay | Admitting: Family Medicine

## 2017-06-24 ENCOUNTER — Ambulatory Visit (INDEPENDENT_AMBULATORY_CARE_PROVIDER_SITE_OTHER)
Admission: RE | Admit: 2017-06-24 | Discharge: 2017-06-24 | Disposition: A | Discharge status: Home / Self Care | Payer: Medicare HMO | Source: Ambulatory Visit | Attending: Family Medicine | Admitting: Family Medicine

## 2017-06-24 ENCOUNTER — Ambulatory Visit (INDEPENDENT_AMBULATORY_CARE_PROVIDER_SITE_OTHER): Payer: Medicare HMO | Admitting: Family Medicine

## 2017-06-24 VITALS — BP 118/76 | HR 70 | Temp 98.1°F | Wt 126.2 lb

## 2017-06-24 DIAGNOSIS — M79652 Pain in left thigh: Secondary | ICD-10-CM | POA: Diagnosis not present

## 2017-06-24 MED ORDER — METHOCARBAMOL 500 MG PO TABS: 500.0000 mg | ORAL_TABLET | Freq: Three times a day (TID) | ORAL | 0 refills | Status: DC | PRN

## 2017-06-24 MED ORDER — PREDNISONE 20 MG PO TABS: ORAL_TABLET | ORAL | 0 refills | Status: DC

## 2017-06-24 NOTE — Patient Instructions (Addendum)
xrays today.  I think you pain is coming from the lumbar spine.  Let's treat with muscle relaxant course and prednisone taper for possible inflammation leading to pinched nerve. We will recheck on Thursday.

## 2017-06-24 NOTE — Assessment & Plan Note (Addendum)
Unclear etiology given overall benign exam without reproducible pain. Some concern for hip pathology or herniated disc.  She is on long term bisphosphonate for osteoporosis - will check hip xrays to r/o atypical fracture.  Xray - hip ok on my read but marked scoliosis and spondylosis at lower lumbar vertebrae - will check complete lumbar films as well.  Rx muscle relaxant and prednisone course in interim.

## 2017-06-24 NOTE — Progress Notes (Addendum)
BP 118/76 (BP Location: Left Arm, Patient Position: Sitting, Cuff Size: Normal)   Pulse 70   Temp 98.1 F (36.7 C) (Oral)   Wt 126 lb 4 oz (57.3 kg)   SpO2 98%   BMI 23.47 kg/m    CC: back pain Subjective:    Patient ID: Lindsay Mason, female    DOB: 04/22/1951, 66 y.o.   MRN: [id number]  HPI: Lindsay Mason is a 66 y.o. female presenting on 06/24/2017 for Muscle Pain (Left lower back and side. Pain with walking. Started 1.5-2 weeks ago. Taking Aleve, not sure if helping)   Saw Lesia last month for medicare wellness visit. She has f/u with me on Thursday.  L hip pain started 2 wks ago. Denies inciting trauma/injury recently. She had increased activity, working outdoors in garden. Some radiation to L lower back. Denies shooting pain down left leg.   She has noticed right leg paresthesias and lower right leg numbness. This has been ongoing for the past year. She has been taking b complex vitamin.   No fevers/chills, bowel/bladder incontinence.   Tylenol and advil only help small amt.   Ongoing dental work, one final procedure left. Off fosamax for the last ~2 months. Also stopped vitamins including B complex vitamin.   Relevant past medical, surgical, family and social history reviewed and updated as indicated. Interim medical history since our last visit reviewed. Allergies and medications reviewed and updated. Outpatient Medications Prior to Visit  Medication Sig Dispense Refill  . aspirin EC 81 MG tablet Take 81 mg by mouth daily.      . B Complex-Biotin-FA (B-COMPLEX PO) Take 1 tablet by mouth daily.    Marland Kitchen loperamide (IMODIUM A-D) 2 MG tablet Take 2 mg by mouth 4 (four) times daily as needed for diarrhea or loose stools.    . Multiple Vitamin (MULTIVITAMIN) tablet Take 1 tablet by mouth daily.      Marland Kitchen OVER THE COUNTER MEDICATION Take 1 capsule by mouth daily. SEROVITAL [place]    . vitamin B-12 (CYANOCOBALAMIN) 100 MCG tablet Take 100 mcg by mouth daily.      Marland Kitchen Apoaequorin  (PREVAGEN PO) Take 1 capsule by mouth daily.    . Calcium Carbonate-Vitamin D (CALCIUM 600+D) 600-200 MG-UNIT TABS Take 1 tablet by mouth 2 (two) times daily.       No facility-administered medications prior to visit.      Per HPI unless specifically indicated in ROS section below Review of Systems     Objective:    BP 118/76 (BP Location: Left Arm, Patient Position: Sitting, Cuff Size: Normal)   Pulse 70   Temp 98.1 F (36.7 C) (Oral)   Wt 126 lb 4 oz (57.3 kg)   SpO2 98%   BMI 23.47 kg/m   Wt Readings from Last 3 Encounters:  06/24/17 126 lb 4 oz (57.3 kg)  06/11/17 126 lb 8 oz (57.4 kg)  02/28/16 111 lb 12.8 oz (50.7 kg)    Physical Exam  Constitutional: She is oriented to person, place, and time. She appears well-developed and well-nourished. No distress.  Musculoskeletal: Normal range of motion. She exhibits no edema.  No pain midline spine No paraspinous mm tenderness Neg SLR bilaterally. No pain with int/ext rotation at hip. + FABER on right but no reproducible pain at SIJ, GTB or sciatic notch bilaterally.   Neurological: She is alert and oriented to person, place, and time. No sensory deficit.  Mildly decreased strength LLE  Sensation intact Antalgic  gait  Nursing note and vitals reviewed.  Lab Results  Component Value Date   CREATININE 0.59 06/12/2017       Assessment & Plan:   Problem List Items Addressed This Visit    Left thigh pain - Primary    Unclear etiology given overall benign exam without reproducible pain. Some concern for hip pathology or herniated disc.  She is on long term bisphosphonate for osteoporosis - will check hip xrays to r/o atypical fracture.  Xray - hip ok on my read but marked scoliosis and spondylosis at lower lumbar vertebrae - will check complete lumbar films as well.  Rx muscle relaxant and prednisone course in interim.       Relevant Orders   DG HIP UNILAT WITH PELVIS 2-3 VIEWS LEFT   DG Lumbar Spine Complete        Follow up plan: Return if symptoms worsen or fail to improve.  Ria Bush, MD

## 2017-06-25 ENCOUNTER — Ambulatory Visit
Admission: RE | Admit: 2017-06-25 | Discharge: 2017-06-25 | Disposition: A | Discharge status: Home / Self Care | Payer: Medicare HMO | Source: Ambulatory Visit | Attending: Family Medicine | Admitting: Family Medicine

## 2017-06-25 DIAGNOSIS — E2839 Other primary ovarian failure: Secondary | ICD-10-CM

## 2017-06-25 DIAGNOSIS — Z1239 Encounter for other screening for malignant neoplasm of breast: Secondary | ICD-10-CM

## 2017-06-26 ENCOUNTER — Ambulatory Visit (INDEPENDENT_AMBULATORY_CARE_PROVIDER_SITE_OTHER): Payer: Medicare HMO | Admitting: Family Medicine

## 2017-06-26 ENCOUNTER — Encounter: Payer: Self-pay | Admitting: Family Medicine

## 2017-06-26 VITALS — BP 112/78 | HR 68 | Temp 97.6°F | Ht 62.0 in | Wt 126.0 lb

## 2017-06-26 DIAGNOSIS — Z7189 Other specified counseling: Secondary | ICD-10-CM | POA: Diagnosis not present

## 2017-06-26 DIAGNOSIS — J439 Emphysema, unspecified: Secondary | ICD-10-CM

## 2017-06-26 DIAGNOSIS — Z0000 Encounter for general adult medical examination without abnormal findings: Secondary | ICD-10-CM | POA: Diagnosis not present

## 2017-06-26 DIAGNOSIS — M419 Scoliosis, unspecified: Secondary | ICD-10-CM

## 2017-06-26 DIAGNOSIS — S76212D Strain of adductor muscle, fascia and tendon of left thigh, subsequent encounter: Secondary | ICD-10-CM | POA: Diagnosis not present

## 2017-06-26 DIAGNOSIS — M4150 Other secondary scoliosis, site unspecified: Secondary | ICD-10-CM

## 2017-06-26 DIAGNOSIS — I700 Atherosclerosis of aorta: Secondary | ICD-10-CM

## 2017-06-26 DIAGNOSIS — Z23 Encounter for immunization: Secondary | ICD-10-CM

## 2017-06-26 DIAGNOSIS — M8580 Other specified disorders of bone density and structure, unspecified site: Secondary | ICD-10-CM

## 2017-06-26 DIAGNOSIS — K52839 Microscopic colitis, unspecified: Secondary | ICD-10-CM

## 2017-06-26 DIAGNOSIS — M4180 Other forms of scoliosis, site unspecified: Secondary | ICD-10-CM

## 2017-06-26 MED ORDER — TIZANIDINE HCL 2 MG PO CAPS: 2.0000 mg | ORAL_CAPSULE | Freq: Two times a day (BID) | ORAL | 0 refills | Status: DC | PRN

## 2017-06-26 NOTE — Assessment & Plan Note (Signed)
Continue aspirin daily.

## 2017-06-26 NOTE — Assessment & Plan Note (Signed)
Today exam more consistent with L groin strain. Exercises provided. Discussed muscle relaxant- pt states robaxin was unavailable and will need replacement - I still have not received phone or fax request from pharmacy. I called pharmacy to resolve issue. Will send in zanaflex 2mg  in its place.

## 2017-06-26 NOTE — Assessment & Plan Note (Signed)
Never returned for f/u with GI.  Stable period. Aware to return to GI if recurrent diarrhea.

## 2017-06-26 NOTE — Assessment & Plan Note (Signed)
Reviewed latest DEXA, as well as calcium, vit D, and weight bearing exercise recommendations.

## 2017-06-26 NOTE — Assessment & Plan Note (Signed)
By CT - pt declines significant breathing difficulty.

## 2017-06-26 NOTE — Assessment & Plan Note (Addendum)
Reviewed recent xrays with patient.

## 2017-06-26 NOTE — Progress Notes (Signed)
BP 112/78 (BP Location: Left Arm, Patient Position: Sitting, Cuff Size: Normal)   Pulse 68   Temp 97.6 F (36.4 C) (Oral)   Ht 5\' 2"  (1.575 m)   Wt 126 lb (57.2 kg)   SpO2 97%   BMI 23.05 kg/m    CC: CPE Subjective:    Patient ID: Lindsay Mason, female    DOB: 09-27-1951, 66 y.o.   MRN: [id number]  HPI: Lindsay Mason is a 66 y.o. female presenting on 06/26/2017 for Medicare Wellness   Saw [name] last week for medicare wellness visit. Note reviewed.   Ongoing L groin pain but somewhat better. See prior note for details.    Preventative: COLONOSCOPY Date: 06/2015 microscopic colitis [name] - pt never returned for f/u.  Lung cancer screening - 02/2016 stable. Desires to continue screening.  Breast cancer screening - mammogram today WNL Well woman exam - last well woman exam with [name] 02/2016 - WNL. Discussed considering pelvic exam every 3 yrs.  DEXA Date: 06/2017 - hip -2.2, forearm -1.9. Scoliosis. Good calcium in diet. Avoids dairy but eats greek yogurt. Last vit D 72 (2012). Regularly gets weight bearing exercises.  Flu shot yearly Tetanus shot - 2005, update Tdap today Pneumovax 12/2011, prevnar 05/2017.  zostavax - 2014 shingrix - discussed Advanced directive discussion - she has this at home. Asked to bring [place] copy. Husband [name] would be HCPOA.  Seat belt use discussed Sunscreen use and skin screen discussed. Tans regularly.  Ex smoker - quit 07/2015!  Alcohol - rare  Caffeine: 1 cup coffee/day  Lives with husband, outside dog  Activity: Very active at her pool and in her garden.   Relevant past medical, surgical, family and social history reviewed and updated as indicated. Interim medical history since our last visit reviewed. Allergies and medications reviewed and updated. Outpatient Medications Prior to Visit  Medication Sig Dispense Refill  . Apoaequorin (PREVAGEN PO) Take 1 capsule by mouth daily.    Marland Kitchen aspirin EC 81 MG tablet Take 81 mg by mouth daily.       . B Complex-Biotin-FA (B-COMPLEX PO) Take 1 tablet by mouth daily.    . Calcium Carbonate-Vitamin D (CALCIUM 600+D) 600-200 MG-UNIT TABS Take 1 tablet by mouth 2 (two) times daily.      Marland Kitchen loperamide (IMODIUM A-D) 2 MG tablet Take 2 mg by mouth 4 (four) times daily as needed for diarrhea or loose stools.    . Multiple Vitamin (MULTIVITAMIN) tablet Take 1 tablet by mouth daily.      Marland Kitchen OVER THE COUNTER MEDICATION Take 1 capsule by mouth daily. SEROVITAL [place]    . predniSONE (DELTASONE) 20 MG tablet Take two tablets daily for 3 days followed by one tablet daily for 4 days 10 tablet 0  . vitamin B-12 (CYANOCOBALAMIN) 100 MCG tablet Take 100 mcg by mouth daily.      . methocarbamol (ROBAXIN) 500 MG tablet Take 1 tablet (500 mg total) by mouth 3 (three) times daily as needed for muscle spasms (sedation precautions). 30 tablet 0   No facility-administered medications prior to visit.      Per HPI unless specifically indicated in ROS section below Review of Systems  Constitutional: Negative for activity change, appetite change, chills, fatigue, fever and unexpected weight change.  HENT: Negative for hearing loss.   Eyes: Negative for visual disturbance.  Respiratory: Negative for cough, chest tightness, shortness of breath and wheezing.   Cardiovascular: Negative for chest pain, palpitations and leg  swelling.  Gastrointestinal: Negative for abdominal distention, abdominal pain, blood in stool, constipation, diarrhea, nausea and vomiting.  Genitourinary: Negative for difficulty urinating and hematuria.  Musculoskeletal: Negative for arthralgias, myalgias and neck pain.       L leg pain - see HPI  Skin: Negative for rash.  Neurological: Negative for dizziness, seizures, syncope and headaches.  Hematological: Negative for adenopathy. Does not bruise/bleed easily.  Psychiatric/Behavioral: Negative for dysphoric mood. The patient is not nervous/anxious.        Objective:    BP 112/78 (BP  Location: Left Arm, Patient Position: Sitting, Cuff Size: Normal)   Pulse 68   Temp 97.6 F (36.4 C) (Oral)   Ht 5\' 2"  (1.575 m)   Wt 126 lb (57.2 kg)   SpO2 97%   BMI 23.05 kg/m   Wt Readings from Last 3 Encounters:  06/26/17 126 lb (57.2 kg)  06/24/17 126 lb 4 oz (57.3 kg)  06/11/17 126 lb 8 oz (57.4 kg)    Physical Exam  Constitutional: She is oriented to person, place, and time. She appears well-developed and well-nourished. No distress.  HENT:  Head: Normocephalic and atraumatic.  Right Ear: Hearing, tympanic membrane, external ear and ear canal normal.  Left Ear: Hearing, tympanic membrane, external ear and ear canal normal.  Nose: Nose normal.  Mouth/Throat: Uvula is midline, oropharynx is clear and moist and mucous membranes are normal. No oropharyngeal exudate, posterior oropharyngeal edema or posterior oropharyngeal erythema.  Eyes: Pupils are equal, round, and reactive to light. Conjunctivae and EOM are normal. No scleral icterus.  Neck: Normal range of motion. Neck supple. Carotid bruit is not present. No thyromegaly present.  Cardiovascular: Normal rate, regular rhythm, normal heart sounds and intact distal pulses.   No murmur heard. Pulses:      Radial pulses are 2+ on the right side, and 2+ on the left side.  Pulmonary/Chest: Effort normal and breath sounds normal. No respiratory distress. She has no wheezes. She has no rales.  Abdominal: Soft. Bowel sounds are normal. She exhibits no distension and no mass. There is no tenderness. There is no rebound and no guarding.  Musculoskeletal: Normal range of motion. She exhibits no edema.  Neg SLR bilaterally. No pain with int/ext rotation at hip. Tender to testing against resistance L hip flexor, extensor, and abductor  Lymphadenopathy:    She has no cervical adenopathy.  Neurological: She is alert and oriented to person, place, and time.  CN grossly intact, station and gait intact  Skin: Skin is warm and dry. No rash  noted.  Psychiatric: She has a normal mood and affect. Her behavior is normal. Judgment and thought content normal.  Nursing note and vitals reviewed.  Results for orders placed or performed in visit on 46/27/03  Basic metabolic panel  Result Value Ref Range   Sodium 141 135 - 145 mEq/L   Potassium 4.7 3.5 - 5.1 mEq/L   Chloride 109 96 - 112 mEq/L   CO2 29 19 - 32 mEq/L   Glucose, Bld 87 70 - 99 mg/dL   BUN 24 (H) 6 - 23 mg/dL   Creatinine, Ser 0.59 0.40 - 1.20 mg/dL   Calcium 9.0 8.4 - 10.5 mg/dL   GFR 108.28 >60.00 mL/min  Lipid panel  Result Value Ref Range   Cholesterol 169 0 - 200 mg/dL   Triglycerides 48.0 0.0 - 149.0 mg/dL   HDL 74.00 >39.00 mg/dL   VLDL 9.6 0.0 - 40.0 mg/dL   LDL Cholesterol 85  0 - 99 mg/dL   Total CHOL/HDL Ratio 2    NonHDL 95.02   VITAMIN D 25 Hydroxy (Vit-D Deficiency, Fractures)  Result Value Ref Range   VITD 42.26 30.00 - 100.00 ng/mL      Assessment & Plan:   Problem List Items Addressed This Visit    Advanced care planning/counseling discussion    Advanced directive discussion - she has this at home. Asked to bring [place] copy. Husband [name] would be HCPOA.       COPD with emphysema ([place])    By CT - pt declines significant breathing difficulty.      Degenerative scoliosis    Reviewed recent xrays with patient.       Health maintenance examination - Primary    Preventative protocols reviewed and updated unless pt declined. Discussed healthy diet and lifestyle.       Microscopic colitis    Never returned for f/u with GI.  Stable period. Aware to return to GI if recurrent diarrhea.       Osteopenia    Reviewed latest DEXA, as well as calcium, vit D, and weight bearing exercise recommendations.       Strain of groin, left, subsequent encounter    Today exam more consistent with L groin strain. Exercises provided. Discussed muscle relaxant- pt states robaxin was unavailable and will need replacement - I still have not received phone or  fax request from pharmacy. I called pharmacy to resolve issue. Will send in zanaflex 2mg  in its place.      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        Other Visit Diagnoses    Need for influenza vaccination       Relevant Orders   Flu Vaccine QUAD 6+ mos PF IM (Fluarix Quad PF) (Completed)       Follow up plan: Return in about 1 year (around 06/26/2018) for medicare wellness visit, annual exam, prior fasting for blood work.  Ria Bush, MD

## 2017-06-26 NOTE — Assessment & Plan Note (Signed)
Preventative protocols reviewed and updated unless pt declined. Discussed healthy diet and lifestyle.  

## 2017-06-26 NOTE — Assessment & Plan Note (Signed)
Advanced directive discussion - she has this at home. Asked to bring [place] copy. Husband [name] would be HCPOA.

## 2017-06-26 NOTE — Patient Instructions (Addendum)
Flu shot today We will refer you to lung cancer screening nurse.  If interested, check with pharmacy about new 2 shot shingles series (shingrix).  Bring [place] copy of your advanced directive. If prednisone doesn't help, may stop. Try muscle relaxant - I will send in alternative. Do exercises provided today.  You do have some scoliosis of lower back as well as wear and tear arthritis, but these are chronic issues.  You are doing well today. Return as needed or in 1 year for next physical.  Health Maintenance, Female Adopting a healthy lifestyle and getting preventive care can go a long way to promote health and wellness. Talk with your health care provider about what schedule of regular examinations is right for you. This is a good chance for you to check in with your provider about disease prevention and staying healthy. In between checkups, there are plenty of things you can do on your own. Experts have done a lot of research about which lifestyle changes and preventive measures are most likely to keep you healthy. Ask your health care provider for more information. Weight and diet Eat a healthy diet  Be sure to include plenty of vegetables, fruits, low-fat dairy products, and lean protein.  Do not eat a lot of foods high in solid fats, added sugars, or salt.  Get regular exercise. This is one of the most important things you can do for your health. ? Most adults should exercise for at least 150 minutes each week. The exercise should increase your heart rate and make you sweat (moderate-intensity exercise). ? Most adults should also do strengthening exercises at least twice a week. This is in addition to the moderate-intensity exercise.  Maintain a healthy weight  Body mass index (BMI) is a measurement that can be used to identify possible weight problems. It estimates body fat based on height and weight. Your health care provider can help determine your BMI and help you achieve or maintain a  healthy weight.  For females 33 years of age and older: ? A BMI below 18.5 is considered underweight. ? A BMI of 18.5 to 24.9 is normal. ? A BMI of 25 to 29.9 is considered overweight. ? A BMI of 30 and above is considered obese.  Watch levels of cholesterol and blood lipids  You should start having your blood tested for lipids and cholesterol at 66 years of age, then have this test every 5 years.  You may need to have your cholesterol levels checked more often if: ? Your lipid or cholesterol levels are high. ? You are older than 66 years of age. ? You are at high risk for heart disease.  Cancer screening Lung Cancer  Lung cancer screening is recommended for adults 51-104 years old who are at high risk for lung cancer because of a history of smoking.  A yearly low-dose CT scan of the lungs is recommended for people who: ? Currently smoke. ? Have quit within the past 15 years. ? Have at least a 30-pack-year history of smoking. A pack year is smoking an average of one pack of cigarettes a day for 1 year.  Yearly screening should continue until it has been 15 years since you quit.  Yearly screening should stop if you develop a health problem that would prevent you from having lung cancer treatment.  Breast Cancer  Practice breast self-awareness. This means understanding how your breasts normally appear and feel.  It also means doing regular breast self-exams. Let your  health care provider know about any changes, no matter how small.  If you are in your 20s or 30s, you should have a clinical breast exam (CBE) by a health care provider every 1-3 years as part of a regular health exam.  If you are 98 or older, have a CBE every year. Also consider having a breast X-ray (mammogram) every year.  If you have a family history of breast cancer, talk to your health care provider about genetic screening.  If you are at high risk for breast cancer, talk to your health care provider about  having an MRI and a mammogram every year.  Breast cancer gene (BRCA) assessment is recommended for women who have family members with BRCA-related cancers. BRCA-related cancers include: ? Breast. ? Ovarian. ? Tubal. ? Peritoneal cancers.  Results of the assessment will determine the need for genetic counseling and BRCA1 and BRCA2 testing.  Cervical Cancer Your health care provider may recommend that you be screened regularly for cancer of the pelvic organs (ovaries, uterus, and vagina). This screening involves a pelvic examination, including checking for microscopic changes to the surface of your cervix (Pap test). You may be encouraged to have this screening done every 3 years, beginning at age 17.  For women ages 33-65, health care providers may recommend pelvic exams and Pap testing every 3 years, or they may recommend the Pap and pelvic exam, combined with testing for human papilloma virus (HPV), every 5 years. Some types of HPV increase your risk of cervical cancer. Testing for HPV may also be done on women of any age with unclear Pap test results.  Other health care providers may not recommend any screening for nonpregnant women who are considered low risk for pelvic cancer and who do not have symptoms. Ask your health care provider if a screening pelvic exam is right for you.  If you have had past treatment for cervical cancer or a condition that could lead to cancer, you need Pap tests and screening for cancer for at least 20 years after your treatment. If Pap tests have been discontinued, your risk factors (such as having a new sexual partner) need to be reassessed to determine if screening should resume. Some women have medical problems that increase the chance of getting cervical cancer. In these cases, your health care provider may recommend more frequent screening and Pap tests.  Colorectal Cancer  This type of cancer can be detected and often prevented.  Routine colorectal cancer  screening usually begins at 66 years of age and continues through 66 years of age.  Your health care provider may recommend screening at an earlier age if you have risk factors for colon cancer.  Your health care provider may also recommend using home test kits to check for hidden blood in the stool.  A small camera at the end of a tube can be used to examine your colon directly (sigmoidoscopy or colonoscopy). This is done to check for the earliest forms of colorectal cancer.  Routine screening usually begins at age 17.  Direct examination of the colon should be repeated every 5-10 years through 66 years of age. However, you may need to be screened more often if early forms of precancerous polyps or small growths are found.  Skin Cancer  Check your skin from head to toe regularly.  Tell your health care provider about any new moles or changes in moles, especially if there is a change in a mole's shape or color.  Also  tell your health care provider if you have a mole that is larger than the size of a pencil eraser.  Always use sunscreen. Apply sunscreen liberally and repeatedly throughout the day.  Protect yourself by wearing long sleeves, pants, a wide-brimmed hat, and sunglasses whenever you are outside.  Heart disease, diabetes, and high blood pressure  High blood pressure causes heart disease and increases the risk of stroke. High blood pressure is more likely to develop in: ? People who have blood pressure in the high end of the normal range (130-139/85-89 mm Hg). ? People who are overweight or obese. ? People who are African American.  If you are 18-67 years of age, have your blood pressure checked every 3-5 years. If you are 52 years of age or older, have your blood pressure checked every year. You should have your blood pressure measured twice-once when you are at a hospital or clinic, and once when you are not at a hospital or clinic. Record the average of the two measurements.  To check your blood pressure when you are not at a hospital or clinic, you can use: ? An automated blood pressure machine at a pharmacy. ? A home blood pressure monitor.  If you are between 62 years and 63 years old, ask your health care provider if you should take aspirin to prevent strokes.  Have regular diabetes screenings. This involves taking a blood sample to check your fasting blood sugar level. ? If you are at a normal weight and have a low risk for diabetes, have this test once every three years after 66 years of age. ? If you are overweight and have a high risk for diabetes, consider being tested at a younger age or more often. Preventing infection Hepatitis B  If you have a higher risk for hepatitis B, you should be screened for this virus. You are considered at high risk for hepatitis B if: ? You were born in a country where hepatitis B is common. Ask your health care provider which countries are considered high risk. ? Your parents were born in a high-risk country, and you have not been immunized against hepatitis B (hepatitis B vaccine). ? You have HIV or AIDS. ? You use needles to inject street drugs. ? You live with someone who has hepatitis B. ? You have had sex with someone who has hepatitis B. ? You get hemodialysis treatment. ? You take certain medicines for conditions, including cancer, organ transplantation, and autoimmune conditions.  Hepatitis C  Blood testing is recommended for: ? Everyone born from [birth year] through 1965. ? Anyone with known risk factors for hepatitis C.  Sexually transmitted infections (STIs)  You should be screened for sexually transmitted infections (STIs) including gonorrhea and chlamydia if: ? You are sexually active and are younger than 66 years of age. ? You are older than 66 years of age and your health care provider tells you that you are at risk for this type of infection. ? Your sexual activity has changed since you were last screened  and you are at an increased risk for chlamydia or gonorrhea. Ask your health care provider if you are at risk.  If you do not have HIV, but are at risk, it may be recommended that you take a prescription medicine daily to prevent HIV infection. This is called pre-exposure prophylaxis (PrEP). You are considered at risk if: ? You are sexually active and do not regularly use condoms or know the HIV status of your  partner(s). ? You take drugs by injection. ? You are sexually active with a partner who has HIV.  Talk with your health care provider about whether you are at high risk of being infected with HIV. If you choose to begin PrEP, you should first be tested for HIV. You should then be tested every 3 months for as long as you are taking PrEP. Pregnancy  If you are premenopausal and you may become pregnant, ask your health care provider about preconception counseling.  If you may become pregnant, take 400 to 800 micrograms (mcg) of folic acid every day.  If you want to prevent pregnancy, talk to your health care provider about birth control (contraception). Osteoporosis and menopause  Osteoporosis is a disease in which the bones lose minerals and strength with aging. This can result in serious bone fractures. Your risk for osteoporosis can be identified using a bone density scan.  If you are 52 years of age or older, or if you are at risk for osteoporosis and fractures, ask your health care provider if you should be screened.  Ask your health care provider whether you should take a calcium or vitamin D supplement to lower your risk for osteoporosis.  Menopause may have certain physical symptoms and risks.  Hormone replacement therapy may reduce some of these symptoms and risks. Talk to your health care provider about whether hormone replacement therapy is right for you. Follow these instructions at home:  Schedule regular health, dental, and eye exams.  Stay current with your  immunizations.  Do not use any tobacco products including cigarettes, chewing tobacco, or electronic cigarettes.  If you are pregnant, do not drink alcohol.  If you are breastfeeding, limit how much and how often you drink alcohol.  Limit alcohol intake to no more than 1 drink per day for nonpregnant women. One drink equals 12 ounces of beer, 5 ounces of wine, or 1 ounces of hard liquor.  Do not use street drugs.  Do not share needles.  Ask your health care provider for help if you need support or information about quitting drugs.  Tell your health care provider if you often feel depressed.  Tell your health care provider if you have ever been abused or do not feel safe at home. This information is not intended to replace advice given to you by your health care provider. Make sure you discuss any questions you have with your health care provider. Document Released: 04/15/2011 Document Revised: 03/07/2016 Document Reviewed: 07/04/2015 Elsevier Interactive Patient Education  [employer].

## 2017-07-08 ENCOUNTER — Telehealth: Payer: Self-pay | Admitting: Family Medicine

## 2017-07-08 NOTE — Telephone Encounter (Signed)
PT is requesting a copy of the 9/11 x-rays for visit with Dr. Berenice Primas. Please call when ready for pick up.

## 2017-07-09 NOTE — Telephone Encounter (Signed)
Disc is at the front, patient notified by message that disc is ready to pick up

## 2017-10-10 ENCOUNTER — Encounter: Payer: Self-pay | Admitting: Family Medicine

## 2017-10-10 ENCOUNTER — Ambulatory Visit (INDEPENDENT_AMBULATORY_CARE_PROVIDER_SITE_OTHER): Payer: Medicare HMO | Admitting: Family Medicine

## 2017-10-10 VITALS — BP 120/72 | HR 73 | Temp 97.9°F | Wt 132.0 lb

## 2017-10-10 DIAGNOSIS — R0600 Dyspnea, unspecified: Secondary | ICD-10-CM | POA: Insufficient documentation

## 2017-10-10 DIAGNOSIS — R0603 Acute respiratory distress: Secondary | ICD-10-CM

## 2017-10-10 NOTE — Addendum Note (Signed)
Addended by: Brenton Grills on: [id number] 02:37 PM   Modules accepted: Orders

## 2017-10-10 NOTE — Patient Instructions (Addendum)
EKG today. I'm not sure what this episode was - sounds like reaction of some sort.  If recurrent call 911 again.  Keep benadryl on hand

## 2017-10-10 NOTE — Progress Notes (Addendum)
BP 120/72 (BP Location: Left Arm, Patient Position: Sitting, Cuff Size: Normal)   Pulse 73   Temp 97.9 F (36.6 C) (Oral)   Wt 132 lb (59.9 kg)   SpO2 95%   BMI 24.14 kg/m    CC: EKG f/u Subjective:    Patient ID: Lindsay Mason, female    DOB: 19-Nov-1950, 66 y.o.   MRN: [id number]  HPI: Lindsay Mason is a 66 y.o. female presenting on 10/10/2017 for EKG (States on 10/07/17, had some shortness of breath and could not catch her breath. Was examined by EMS at the house, which inclueded EKG but declined ER visit. Provided copy of EKG.)   Episode on Christmas day when she was hosting 52 family and friends - sudden shortness of breath associated with wheezing "felt like when I had PCN reaction and throat swelled". This lasted 4-5 minutes. Denies chest pain, nausea or vomiting, dizziness. She did have racing heart due to anxiety. No significant anxiety/stress prior to symptoms. She was enjoying family. Normally takes supplements regularly - had not taken anything that morning.   EMS called 10/07/2017 to her house due to dyspnea, trouble catching her breath. Was not told what her blood pressure was.  She brings EMS EKG which was reviewed and will be scanned.  EMS EKG - NSR rate 70s, normal axis, intervals, non specific poor R wave progression and ST changes anteriorly.  Normal ETT 2013.   No new medicines, foods, vitamins, supplements.  No recent prolonged periods of immobility.  No h/o blood clots. Not on HRT.  No known food allergy.   Relevant past medical, surgical, family and social history reviewed and updated as indicated. Interim medical history since our last visit reviewed. Allergies and medications reviewed and updated. Outpatient Medications Prior to Visit  Medication Sig Dispense Refill  . Apoaequorin (PREVAGEN PO) Take 1 capsule by mouth daily.    Marland Kitchen aspirin EC 81 MG tablet Take 81 mg by mouth daily.      . B Complex-Biotin-FA (B-COMPLEX PO) Take 1 tablet by mouth  daily.    . Calcium Carbonate-Vitamin D (CALCIUM 600+D) 600-200 MG-UNIT TABS Take 1 tablet by mouth 2 (two) times daily.      Marland Kitchen loperamide (IMODIUM A-D) 2 MG tablet Take 2 mg by mouth 4 (four) times daily as needed for diarrhea or loose stools.    . Multiple Vitamin (MULTIVITAMIN) tablet Take 1 tablet by mouth daily.      Marland Kitchen OVER THE COUNTER MEDICATION Take 1 capsule by mouth daily. SEROV[facility] [place]    . vitamin B-12 (CYANOCOBALAMIN) 100 MCG tablet Take 100 mcg by mouth daily.      . predniSONE (DELTASONE) 20 MG tablet Take two tablets daily for 3 days followed by one tablet daily for 4 days 10 tablet 0  . tizanidine (ZANAFLEX) 2 MG capsule Take 1 capsule (2 mg total) by mouth 2 (two) times daily as needed for muscle spasms (sedation precautions). 30 capsule 0   No facility-administered medications prior to visit.      Per HPI unless specifically indicated in ROS section below Review of Systems     Objective:    BP 120/72 (BP Location: Left Arm, Patient Position: Sitting, Cuff Size: Normal)   Pulse 73   Temp 97.9 F (36.6 C) (Oral)   Wt 132 lb (59.9 kg)   SpO2 95%   BMI 24.14 kg/m   Wt Readings from Last 3 Encounters:  10/10/17 132 lb (59.9 kg)  06/26/17  126 lb (57.2 kg)  06/24/17 126 lb 4 oz (57.3 kg)    Physical Exam  Constitutional: She appears well-developed and well-nourished. No distress.  HENT:  Mouth/Throat: Oropharynx is clear and moist. No oropharyngeal exudate.  Eyes: Conjunctivae and EOM are normal. Pupils are equal, round, and reactive to light. No scleral icterus.  Cardiovascular: Normal rate, regular rhythm, normal heart sounds and intact distal pulses.  No murmur heard. Pulmonary/Chest: Effort normal and breath sounds normal. No respiratory distress. She has no wheezes. She has no rales.  Musculoskeletal: She exhibits no edema.  Skin: Skin is warm and dry. No rash noted.  Psychiatric: She has a normal mood and affect.  Nursing note and vitals reviewed.  Results  for orders placed or performed in visit on 09/32/35  Basic metabolic panel  Result Value Ref Range   Sodium 141 135 - 145 mEq/L   Potassium 4.7 3.5 - 5.1 mEq/L   Chloride 109 96 - 112 mEq/L   CO2 29 19 - 32 mEq/L   Glucose, Bld 87 70 - 99 mg/dL   BUN 24 (H) 6 - 23 mg/dL   Creatinine, Ser 0.59 0.40 - 1.20 mg/dL   Calcium 9.0 8.4 - 10.5 mg/dL   GFR 108.28 >60.00 mL/min  Lipid panel  Result Value Ref Range   Cholesterol 169 0 - 200 mg/dL   Triglycerides 48.0 0.0 - 149.0 mg/dL   HDL 74.00 >39.00 mg/dL   VLDL 9.6 0.0 - 40.0 mg/dL   LDL Cholesterol 85 0 - 99 mg/dL   Total CHOL/HDL Ratio 2    NonHDL 95.02   VITAMIN D 25 Hydroxy (Vit-D Deficiency, Fractures)  Result Value Ref Range   VITD 42.26 30.00 - 100.00 ng/mL   EKG today - NSR rate 60 borderline bradycardia, normal axis, intervals, poor R wave progression but no acute ST/T changes, unchanged from prior 2013    Assessment & Plan:   Problem List Items Addressed This Visit    Dyspnea - Primary    Recent episode over Christmas of acute dyspnea which seems to have been related to throat swelling. ?allergic. This resolved on its own. No cause identified. Advised to call EMS if this happens again, watch for triggers. Discussed epi pen - pt declines. rec keep benadryl on hand.  EKG showing nonspecific precordial ST/T changes - will rpt today.           Follow up plan: No Follow-up on file.  Ria Bush, MD

## 2017-10-10 NOTE — Assessment & Plan Note (Addendum)
Recent episode over Christmas of acute dyspnea which seems to have been related to throat swelling. ?allergic. This resolved on its own. No cause identified. Advised to call EMS if this happens again, watch for triggers. Discussed epi pen - pt declines. rec keep benadryl on hand.  EMS EKG showing nonspecific precordial ST/T changes - will rpt today.

## 2017-10-13 ENCOUNTER — Encounter: Payer: Self-pay | Admitting: *Deleted

## 2017-10-16 ENCOUNTER — Telehealth: Payer: Self-pay | Admitting: Family Medicine

## 2017-10-16 NOTE — Telephone Encounter (Signed)
Looks like this was mailed to her. I'm sorry we should have called her. plz let her know EKG was ok. Let us know if any recurrent symptoms.

## 2017-10-16 NOTE — Telephone Encounter (Signed)
Pt is requesting results of EKG.

## 2017-10-16 NOTE — Telephone Encounter (Signed)
Copied from [place]. Topic: Quick Communication - Lab Results >> Oct 16, 2017 11:57 AM Lysle Morales L, NT wrote: {CHL AMB CRM LAB RESULTS:[id number]:::Patient would like results please call [phone]  ,please leave a detailed message

## 2017-10-16 NOTE — Telephone Encounter (Signed)
Copied from [place]. Topic: Quick Communication - Lab Results >> Oct 16, 2017 11:57 AM Lysle Morales L, NT wrote: {CHL AMB CRM LAB RESULTS:[id number]:::Patient would like results please call [phone]  ,please leave a detailed message

## 2017-10-17 NOTE — Telephone Encounter (Signed)
Spoke with pt relaying results per Dr. Darnell Level.  She says ok.

## 2017-12-18 ENCOUNTER — Encounter (HOSPITAL_COMMUNITY): Payer: Self-pay | Admitting: Emergency Medicine

## 2017-12-18 ENCOUNTER — Emergency Department (HOSPITAL_COMMUNITY)
Admission: EM | Admit: 2017-12-18 | Discharge: 2017-12-19 | Disposition: A | Discharge status: Home / Self Care | Payer: Medicare HMO | Attending: Emergency Medicine | Admitting: Emergency Medicine

## 2017-12-18 ENCOUNTER — Other Ambulatory Visit: Payer: Self-pay

## 2017-12-18 ENCOUNTER — Emergency Department (HOSPITAL_COMMUNITY): Payer: Medicare HMO

## 2017-12-18 DIAGNOSIS — R079 Chest pain, unspecified: Secondary | ICD-10-CM | POA: Diagnosis not present

## 2017-12-18 DIAGNOSIS — R0602 Shortness of breath: Secondary | ICD-10-CM | POA: Diagnosis not present

## 2017-12-18 DIAGNOSIS — Z5321 Procedure and treatment not carried out due to patient leaving prior to being seen by health care provider: Secondary | ICD-10-CM | POA: Diagnosis not present

## 2017-12-18 LAB — CBC
HCT: 39.9 % (ref 36.0–46.0)
HEMOGLOBIN: 12.8 g/dL (ref 12.0–15.0)
MCH: 31.1 pg (ref 26.0–34.0)
MCHC: 32.1 g/dL (ref 30.0–36.0)
MCV: 96.8 fL (ref 78.0–100.0)
Platelets: 188 10*3/uL (ref 150–400)
RBC: 4.12 MIL/uL (ref 3.87–5.11)
RDW: 13.8 % (ref 11.5–15.5)
WBC: 6.1 10*3/uL (ref 4.0–10.5)

## 2017-12-18 LAB — BASIC METABOLIC PANEL
Anion gap: 9 (ref 5–15)
BUN: 19 mg/dL (ref 6–20)
CALCIUM: 9.2 mg/dL (ref 8.9–10.3)
CO2: 24 mmol/L (ref 22–32)
Chloride: 109 mmol/L (ref 101–111)
Creatinine, Ser: 0.64 mg/dL (ref 0.44–1.00)
GLUCOSE: 76 mg/dL (ref 65–99)
Potassium: 3.8 mmol/L (ref 3.5–5.1)
SODIUM: 142 mmol/L (ref 135–145)

## 2017-12-18 LAB — I-STAT TROPONIN, ED: TROPONIN I, POC: 0 ng/mL (ref 0.00–0.08)

## 2017-12-18 NOTE — ED Notes (Signed)
Pt stated that they were feeling a little better and that they wanted to leave.  I encouraged them to follow up with their PCP and return if symptoms worsen.

## 2017-12-18 NOTE — ED Triage Notes (Signed)
Pt c/o left side 6/10, no radiating  cp that started this morning with some SOB, pt denies any dizziness, nausea or vomiting.

## 2018-01-01 ENCOUNTER — Other Ambulatory Visit: Payer: Self-pay | Admitting: *Deleted

## 2018-01-01 DIAGNOSIS — Z87891 Personal history of nicotine dependence: Secondary | ICD-10-CM

## 2018-01-01 DIAGNOSIS — Z122 Encounter for screening for malignant neoplasm of respiratory organs: Secondary | ICD-10-CM

## 2018-01-01 NOTE — Progress Notes (Signed)
Order placed for lung cancer screen ct due to the previous order being expired.

## 2018-01-27 ENCOUNTER — Ambulatory Visit: Payer: Medicare HMO | Admitting: Family Medicine

## 2018-01-28 ENCOUNTER — Ambulatory Visit (INDEPENDENT_AMBULATORY_CARE_PROVIDER_SITE_OTHER): Payer: Medicare HMO | Admitting: Family Medicine

## 2018-01-28 ENCOUNTER — Encounter: Payer: Self-pay | Admitting: Family Medicine

## 2018-01-28 VITALS — BP 124/64 | HR 81 | Temp 98.6°F | Ht 62.0 in | Wt 131.2 lb

## 2018-01-28 DIAGNOSIS — R05 Cough: Secondary | ICD-10-CM

## 2018-01-28 DIAGNOSIS — Z87891 Personal history of nicotine dependence: Secondary | ICD-10-CM

## 2018-01-28 DIAGNOSIS — J439 Emphysema, unspecified: Secondary | ICD-10-CM

## 2018-01-28 DIAGNOSIS — R059 Cough, unspecified: Secondary | ICD-10-CM | POA: Insufficient documentation

## 2018-01-28 MED ORDER — PREDNISONE 20 MG PO TABS: ORAL_TABLET | ORAL | 0 refills | Status: DC

## 2018-01-28 MED ORDER — ALBUTEROL SULFATE HFA 108 (90 BASE) MCG/ACT IN AERS: 2.0000 | INHALATION_SPRAY | Freq: Four times a day (QID) | RESPIRATORY_TRACT | 3 refills | Status: DC | PRN

## 2018-01-28 NOTE — Progress Notes (Signed)
BP 124/64 (BP Location: Left Arm, Patient Position: Sitting, Cuff Size: Normal)   Pulse 81   Temp 98.6 F (37 C) (Oral)   Ht 5\' 2"  (1.575 m)   Wt 131 lb 4 oz (59.5 kg)   SpO2 95%   BMI 24.01 kg/m    CC: cough Subjective:    Patient ID: Lindsay Mason, female    DOB: May 06, 1951, 67 y.o.   MRN: [id number]  HPI: Lindsay Mason is a 67 y.o. female presenting on 01/28/2018 for Cough (Dry cough started about 3 wks ago. Worse at night. Tried Mucinex DM and her daughter's ProAir inhaler. Inhaler has helped the most. )   3 wk h/o dry non productive cough. Initially with URI sxs of congestion and ST and PNDrainage. Some wheezing and dyspnea associated with this. She has had coughing fits.   Denies fevers/chills, ear or tooth pain.   She quit smoking 07/2015. Rare E-cig use. Father smokes.   She has tried mucinex DM without significant improvement.  She has also tried daughter's proair inhaler with significant improvement.   She has had CT scan with evidence of mod centrilobular and paraseptal emphysema, never treated.   Relevant past medical, surgical, family and social history reviewed and updated as indicated. Interim medical history since our last visit reviewed. Allergies and medications reviewed and updated. Outpatient Medications Prior to Visit  Medication Sig Dispense Refill  . Apoaequorin (PREVAGEN PO) Take 1 capsule by mouth daily.    Marland Kitchen aspirin EC 81 MG tablet Take 81 mg by mouth daily.      . B Complex-Biotin-FA (B-COMPLEX PO) Take 1 tablet by mouth daily.    . Calcium Carbonate-Vitamin D (CALCIUM 600+D) 600-200 MG-UNIT TABS Take 1 tablet by mouth 2 (two) times daily.      . Multiple Vitamin (MULTIVITAMIN) tablet Take 1 tablet by mouth daily.      Marland Kitchen OVER THE COUNTER MEDICATION Take 1 capsule by mouth daily. SEROVITAL [place]    . vitamin B-12 (CYANOCOBALAMIN) 100 MCG tablet Take 100 mcg by mouth daily.      Marland Kitchen loperamide (IMODIUM A-D) 2 MG tablet Take 2 mg by mouth 4 (four)  times daily as needed for diarrhea or loose stools.     No facility-administered medications prior to visit.      Per HPI unless specifically indicated in ROS section below Review of Systems     Objective:    BP 124/64 (BP Location: Left Arm, Patient Position: Sitting, Cuff Size: Normal)   Pulse 81   Temp 98.6 F (37 C) (Oral)   Ht 5\' 2"  (1.575 m)   Wt 131 lb 4 oz (59.5 kg)   SpO2 95%   BMI 24.01 kg/m   Wt Readings from Last 3 Encounters:  01/28/18 131 lb 4 oz (59.5 kg)  12/18/17 130 lb (59 kg)  10/10/17 132 lb (59.9 kg)    Physical Exam  Constitutional: She appears well-developed and well-nourished. No distress.  HENT:  Head: Normocephalic and atraumatic.  Right Ear: Hearing, tympanic membrane, external ear and ear canal normal.  Left Ear: Hearing, tympanic membrane, external ear and ear canal normal.  Nose: No mucosal edema or rhinorrhea. Right sinus exhibits no maxillary sinus tenderness and no frontal sinus tenderness. Left sinus exhibits no maxillary sinus tenderness and no frontal sinus tenderness.  Mouth/Throat: Uvula is midline, oropharynx is clear and moist and mucous membranes are normal. No oropharyngeal exudate, posterior oropharyngeal edema, posterior oropharyngeal erythema or tonsillar abscesses.  Eyes: Pupils are equal, round, and reactive to light. Conjunctivae and EOM are normal. No scleral icterus.  Neck: Normal range of motion. Neck supple.  Cardiovascular: Normal rate, regular rhythm, normal heart sounds and intact distal pulses.  No murmur heard. Pulmonary/Chest: Effort normal and breath sounds normal. No respiratory distress. She has no wheezes. She has no rales.  Lungs overall clear, harsh cough present  Lymphadenopathy:    She has no cervical adenopathy.  Skin: Skin is warm and dry. No rash noted.  Nursing note and vitals reviewed.     Assessment & Plan:   Problem List Items Addressed This Visit    COPD with emphysema ([place])    Previously  declined symptoms but recently noticing increasing dyspnea. I have asked her to return when breathing at baseline for pre/post albuterol spirometry. She agrees.       Relevant Medications   predniSONE (DELTASONE) 20 MG tablet   albuterol (PROVENTIL HFA;VENTOLIN HFA) 108 (90 Base) MCG/ACT inhaler   Cough - Primary    Anticipate post-infectious cough after initial viral URI. No signs of bacterial infection - Rx prednisone and albuterol inhaler. Update if not improving with treatment. She agrees with plan I have asked her to return for spirometry at her convenience for further evaluation of possible COPD by prior imaging.      Ex-smoker    Remains abstinent.  Upcoming rpt LRCT lungs next month          Meds ordered this encounter  Medications  . predniSONE (DELTASONE) 20 MG tablet    Sig: Take two tablets daily for 3 days followed by one tablet daily for 4 days    Dispense:  10 tablet    Refill:  0  . albuterol (PROVENTIL HFA;VENTOLIN HFA) 108 (90 Base) MCG/ACT inhaler    Sig: Inhale 2 puffs into the lungs every 6 (six) hours as needed for wheezing or shortness of breath.    Dispense:  1 Inhaler    Refill:  3   No orders of the defined types were placed in this encounter.   Follow up plan: Return if symptoms worsen or fail to improve.  Ria Bush, MD

## 2018-01-28 NOTE — Assessment & Plan Note (Signed)
Anticipate post-infectious cough after initial viral URI. No signs of bacterial infection - Rx prednisone and albuterol inhaler. Update if not improving with treatment. She agrees with plan I have asked her to return for spirometry at her convenience for further evaluation of possible COPD by prior imaging.

## 2018-01-28 NOTE — Assessment & Plan Note (Signed)
Previously declined symptoms but recently noticing increasing dyspnea. I have asked her to return when breathing at baseline for pre/post albuterol spirometry. She agrees.

## 2018-01-28 NOTE — Assessment & Plan Note (Signed)
Remains abstinent.  Upcoming rpt LRCT lungs next month

## 2018-01-28 NOTE — Patient Instructions (Addendum)
I think you have post infectious cough from residual inflammation in the bronchial tubes after recent cold.  Treat with prednisone taper for 1 week and albuterol inhaler as needed.  Let us know if fever >101, worsening productive cough, or not improving with treatment.  You may also have component of COPD - return in next few months when breathing is doing better for spirometry (in office lung function tests).

## 2018-02-23 ENCOUNTER — Inpatient Hospital Stay: Admission: RE | Admit: 2018-02-23 | Payer: Medicare HMO | Source: Ambulatory Visit

## 2018-03-10 ENCOUNTER — Ambulatory Visit (INDEPENDENT_AMBULATORY_CARE_PROVIDER_SITE_OTHER)
Admission: RE | Admit: 2018-03-10 | Discharge: 2018-03-10 | Disposition: A | Discharge status: Home / Self Care | Payer: Medicare HMO | Source: Ambulatory Visit | Attending: Acute Care | Admitting: Acute Care

## 2018-03-10 DIAGNOSIS — Z87891 Personal history of nicotine dependence: Secondary | ICD-10-CM

## 2018-03-10 DIAGNOSIS — Z122 Encounter for screening for malignant neoplasm of respiratory organs: Secondary | ICD-10-CM

## 2018-03-18 ENCOUNTER — Other Ambulatory Visit: Payer: Self-pay | Admitting: Acute Care

## 2018-03-18 DIAGNOSIS — Z122 Encounter for screening for malignant neoplasm of respiratory organs: Secondary | ICD-10-CM

## 2018-03-18 DIAGNOSIS — Z87891 Personal history of nicotine dependence: Secondary | ICD-10-CM

## 2018-06-17 ENCOUNTER — Other Ambulatory Visit: Payer: Self-pay | Admitting: Family Medicine

## 2018-06-17 DIAGNOSIS — I700 Atherosclerosis of aorta: Secondary | ICD-10-CM

## 2018-06-17 DIAGNOSIS — K52839 Microscopic colitis, unspecified: Secondary | ICD-10-CM

## 2018-06-18 ENCOUNTER — Ambulatory Visit: Payer: Medicare Other

## 2018-06-18 ENCOUNTER — Ambulatory Visit: Payer: Medicare HMO

## 2018-06-25 ENCOUNTER — Encounter: Payer: Medicare HMO | Admitting: Family Medicine

## 2018-06-25 DIAGNOSIS — Z0289 Encounter for other administrative examinations: Secondary | ICD-10-CM

## 2018-07-04 ENCOUNTER — Other Ambulatory Visit: Payer: Self-pay | Admitting: Family Medicine

## 2018-10-17 ENCOUNTER — Other Ambulatory Visit: Payer: Self-pay | Admitting: Family Medicine

## 2018-12-22 ENCOUNTER — Ambulatory Visit (INDEPENDENT_AMBULATORY_CARE_PROVIDER_SITE_OTHER): Payer: Medicare HMO

## 2018-12-22 ENCOUNTER — Other Ambulatory Visit: Payer: Self-pay | Admitting: Family Medicine

## 2018-12-22 VITALS — BP 130/82 | HR 81 | Temp 98.5°F | Ht 61.25 in | Wt 145.5 lb

## 2018-12-22 DIAGNOSIS — K52839 Microscopic colitis, unspecified: Secondary | ICD-10-CM

## 2018-12-22 DIAGNOSIS — Z0000 Encounter for general adult medical examination without abnormal findings: Secondary | ICD-10-CM | POA: Diagnosis not present

## 2018-12-22 DIAGNOSIS — I700 Atherosclerosis of aorta: Secondary | ICD-10-CM

## 2018-12-22 LAB — COMPREHENSIVE METABOLIC PANEL
ALBUMIN: 4.2 g/dL (ref 3.5–5.2)
ALT: 15 U/L (ref 0–35)
AST: 21 U/L (ref 0–37)
Alkaline Phosphatase: 58 U/L (ref 39–117)
BUN: 19 mg/dL (ref 6–23)
CO2: 32 mEq/L (ref 19–32)
Calcium: 9.4 mg/dL (ref 8.4–10.5)
Chloride: 106 mEq/L (ref 96–112)
Creatinine, Ser: 0.74 mg/dL (ref 0.40–1.20)
GFR: 78.08 mL/min (ref >60.00)
GLUCOSE: 88 mg/dL (ref 70–99)
POTASSIUM: 4.8 meq/L (ref 3.5–5.1)
SODIUM: 144 meq/L (ref 135–145)
Total Bilirubin: 0.6 mg/dL (ref 0.2–1.2)
Total Protein: 6 g/dL (ref 6.0–8.3)

## 2018-12-22 LAB — CBC WITH DIFFERENTIAL/PLATELET
Basophils Absolute: 0.1 10*3/uL (ref 0.0–0.1)
Basophils Relative: 1.3 % (ref 0.0–3.0)
Eosinophils Absolute: 0.2 10*3/uL (ref 0.0–0.7)
Eosinophils Relative: 3.5 % (ref 0.0–5.0)
HCT: 41.6 % (ref 36.0–46.0)
Hemoglobin: 14 g/dL (ref 12.0–15.0)
LYMPHS ABS: 1 10*3/uL (ref 0.7–4.0)
Lymphocytes Relative: 22.1 % (ref 12.0–46.0)
MCHC: 33.5 g/dL (ref 30.0–36.0)
MCV: 97.7 fl (ref 78.0–100.0)
Monocytes Absolute: 0.3 10*3/uL (ref 0.1–1.0)
Monocytes Relative: 6.6 % (ref 3.0–12.0)
NEUTROS PCT: 66.5 % (ref 43.0–77.0)
Neutro Abs: 3.1 10*3/uL (ref 1.4–7.7)
Platelets: 221 10*3/uL (ref 150.0–400.0)
RBC: 4.26 Mil/uL (ref 3.87–5.11)
RDW: 13 % (ref 11.5–15.5)
WBC: 4.7 10*3/uL (ref 4.0–10.5)

## 2018-12-22 LAB — LIPID PANEL
CHOL/HDL RATIO: 2
Cholesterol: 197 mg/dL (ref 0–200)
HDL: 98.7 mg/dL (ref >39.00)
LDL Cholesterol: 87 mg/dL (ref 0–99)
NONHDL: 98.67
Triglycerides: 57 mg/dL (ref 0.0–149.0)
VLDL: 11.4 mg/dL (ref 0.0–40.0)

## 2018-12-22 LAB — TSH: TSH: 2.25 u[IU]/mL (ref 0.35–4.50)

## 2018-12-22 NOTE — Progress Notes (Signed)
PCP notes:   Health maintenance:  PPSV23 - PCP follow-up requested  Abnormal screenings:   Mini-cog score: 17/20 MMSE - Mini Mental State Exam 12/22/2018 06/11/2017  Orientation to time 5 5  Orientation to Place 5 5  Registration 3 3  Attention/ Calculation 0 0  Recall 0 3  Recall-comments unable to recall 3 of 3 words -  Language- name 2 objects 0 0  Language- repeat 1 1  Language- follow 3 step command 3 3  Language- read & follow direction 0 0  Write a sentence 0 0  Copy design 0 0  Total score 17 20     Patient concerns:   None  Nurse concerns:  None  Next PCP appt:   12/24/2018 @ 1130

## 2018-12-22 NOTE — Patient Instructions (Signed)
Ms. Seyller , Thank you for taking time to come for your Medicare Wellness Visit. I appreciate your ongoing commitment to your health goals. Please review the following plan we discussed and let me know if I can assist you in the future.   These are the goals we discussed: Goals    . Increase physical activity     Starting 12/22/2018, I will continue to walk at least 35 minutes daily.        This is a list of the screening recommended for you and due dates:  Health Maintenance  Topic Date Due  . Pneumonia vaccines (2 of 2 - PPSV23) 10/14/2019*  . Mammogram  06/26/2019  . Colon Cancer Screening  06/26/2025  . DTaP/Tdap/Td vaccine (2 - Td) 01/31/2026  . Tetanus Vaccine  01/31/2026  . Flu Shot  Completed  . DEXA scan (bone density measurement)  Completed  .  Hepatitis C: One time screening is recommended by Center for Disease Control  (CDC) for  adults born from [birth year] through 1965.   Completed  *Topic was postponed. The date shown is not the original due date.   Preventive Care for Adults  A healthy lifestyle and preventive care can promote health and wellness. Preventive health guidelines for adults include the following key practices.  . A routine yearly physical is a good way to check with your health care provider about your health and preventive screening. It is a chance to share any concerns and updates on your health and to receive a thorough exam.  . Visit your dentist for a routine exam and preventive care every 6 months. Brush your teeth twice a day and floss once a day. Good oral hygiene prevents tooth decay and gum disease.  . The frequency of eye exams is based on your age, health, family medical history, use  of contact lenses, and other factors. Follow your health care provider's recommendations for frequency of eye exams.  . Eat a healthy diet. Foods like vegetables, fruits, whole grains, low-fat dairy products, and lean protein foods contain the nutrients you need  without too many calories. Decrease your intake of foods high in solid fats, added sugars, and salt. Eat the right amount of calories for you. Get information about a proper diet from your health care provider, if necessary.  . Regular physical exercise is one of the most important things you can do for your health. Most adults should get at least 150 minutes of moderate-intensity exercise (any activity that increases your heart rate and causes you to sweat) each week. In addition, most adults need muscle-strengthening exercises on 2 or more days a week.  Silver Sneakers may be a benefit available to you. To determine eligibility, you may visit the website: www.silversneakers.com or contact program at 2233966104 Mon-Fri between 8AM-8PM.   . Maintain a healthy weight. The body mass index (BMI) is a screening tool to identify possible weight problems. It provides an estimate of body fat based on height and weight. Your health care provider can find your BMI and can help you achieve or maintain a healthy weight.   For adults 20 years and older: ? A BMI below 18.5 is considered underweight. ? A BMI of 18.5 to 24.9 is normal. ? A BMI of 25 to 29.9 is considered overweight. ? A BMI of 30 and above is considered obese.   . Maintain normal blood lipids and cholesterol levels by exercising and minimizing your intake of saturated fat. Eat a balanced diet  with plenty of fruit and vegetables. Blood tests for lipids and cholesterol should begin at age 49 and be repeated every 5 years. If your lipid or cholesterol levels are high, you are over 50, or you are at high risk for heart disease, you may need your cholesterol levels checked more frequently. Ongoing high lipid and cholesterol levels should be treated with medicines if diet and exercise are not working.  . If you smoke, find out from your health care provider how to quit. If you do not use tobacco, please do not start.  . If you choose to drink  alcohol, please do not consume more than 2 drinks per day. One drink is considered to be 12 ounces (355 mL) of beer, 5 ounces (148 mL) of wine, or 1.5 ounces (44 mL) of liquor.  . If you are 8-60 years old, ask your health care provider if you should take aspirin to prevent strokes.  . Use sunscreen. Apply sunscreen liberally and repeatedly throughout the day. You should seek shade when your shadow is shorter than you. Protect yourself by wearing long sleeves, pants, a wide-brimmed hat, and sunglasses year round, whenever you are outdoors.  . Once a month, do a whole body skin exam, using a mirror to look at the skin on your back. Tell your health care provider of new moles, moles that have irregular borders, moles that are larger than a pencil eraser, or moles that have changed in shape or color.

## 2018-12-22 NOTE — Progress Notes (Signed)
Subjective:   Lindsay Mason is a 68 y.o. female who presents for Medicare Annual (Subsequent) preventive examination.  Review of Systems:  N/A Cardiac Risk Factors include: advanced age (>27men, >8 women)     Objective:     Vitals: BP 130/82 (BP Location: Right Arm, Patient Position: Sitting, Cuff Size: Large)   Pulse 81   Temp 98.5 F (36.9 C) (Oral)   Ht 5' 1.25" (1.556 m) Comment: no shoes  Wt 145 lb 8 oz (66 kg)   SpO2 95%   BMI 27.27 kg/m   Body mass index is 27.27 kg/m.  Advanced Directives 12/22/2018 06/11/2017 06/27/2015 12/31/2011  Does Patient Have a Medical Advance Directive? Yes Yes Yes Patient has advance directive, copy not in chart  Type of Advance Directive Fate;Living will Living will - Living will  Copy of [place] in Chart? No - copy requested - No - copy requested Copy requested from other (Comment)  Pre-existing out of facility DNR order (yellow form or pink MOST form) - - - No    Tobacco Social History   Tobacco Use  Smoking Status Former Smoker  . Packs/day: 1.00  . Years: 30.00  . Pack years: 30.00  . Types: Cigarettes  . Start date: 10/14/1978  . Last attempt to quit: 07/15/2015  . Years since quitting: 3.4  Smokeless Tobacco Never Used  Tobacco Comment   off and on smoker, now e-cigarettes     Counseling given: No Comment: off and on smoker, now e-cigarettes   Clinical Intake:  Pre-visit preparation completed: Yes  Pain : No/denies pain Pain Score: 0-No pain     Nutritional Status: BMI 25 -29 Overweight Nutritional Risks: None Diabetes: No  How often do you need to have someone help you when you read instructions, pamphlets, or other written materials from your doctor or pharmacy?: 1 - Never What is the last grade level you completed in school?: 12th grade + cosmetology school  Interpreter Needed?: No  Comments: pt lives with spouse Information entered by :: LPinson, LPN  Past  Medical History:  Diagnosis Date  . Adrenal hyperplasia ([place]) 12/2011   on CT  . Allergy   . CAP (community acquired pneumonia) 12/2011  . Chest pain    normal ETT 10/2011  . COPD with emphysema ([place]) 02/2016   by CT - moderate centrilobular and paraseptal  . Ex-smoker 08/2014   currently using e cig  . Microscopic colitis 06/2015   lymphocytic by colonoscopy - started entocort [name]  . Osteoporosis, unspecified   . Scoliosis of lumbar spine   . Thoracic aortic atherosclerosis ([place]) 02/2016   by CT   Past Surgical History:  Procedure Laterality Date  . COLONOSCOPY  06/2015   microscopic colitis [name]  . DEXA  10/2008   Osteoporosis  . DEXA  02/2014   T score osteopenia hip, osteoporosis spine, scoliosis  . ETT  10/2011   WNL, no evidence of ischemia, excellent exercise tolerance  . EXPLORATORY LAPAROTOMY    . MRI head  07/2011   multiple foci deep and subcortical white matter, chronic ischemic vs demyelinating, no active disease  . TONSILLECTOMY AND ADENOIDECTOMY    . Vein removal     removed from legs   Family History  Problem Relation Age of Onset  . Osteoporosis Other        great grandmother  . Stroke Maternal Grandmother   . Coronary artery disease Maternal Grandfather 50  .  Diabetes Maternal Grandfather   . Coronary artery disease Paternal Grandmother 80  . Coronary artery disease Paternal Grandfather 57  . Lung cancer Maternal Aunt    Social History   Socioeconomic History  . Marital status: Married    Spouse name: Not on file  . Number of children: 3  . Years of education: Not on file  . Highest education level: Not on file  Occupational History  . Occupation: retired  Scientific laboratory technician  . Financial resource strain: Not on file  . Food insecurity:    Worry: Not on file    Inability: Not on file  . Transportation needs:    Medical: Not on file    Non-medical: Not on file  Tobacco Use  . Smoking status: Former Smoker    Packs/day: 1.00    Years: 30.00      Pack years: 30.00    Types: Cigarettes    Start date: 10/14/1978    Last attempt to quit: 07/15/2015    Years since quitting: 3.4  . Smokeless tobacco: Never Used  . Tobacco comment: off and on smoker, now e-cigarettes  Substance and Sexual Activity  . Alcohol use: Yes    Alcohol/week: 0.0 standard drinks    Comment: social wine once a week  . Drug use: No  . Sexual activity: Not on file  Lifestyle  . Physical activity:    Days per week: Not on file    Minutes per session: Not on file  . Stress: Not on file  Relationships  . Social connections:    Talks on phone: Not on file    Gets together: Not on file    Attends religious service: Not on file    Active member of club or organization: Not on file    Attends meetings of clubs or organizations: Not on file    Relationship status: Not on file  Other Topics Concern  . Not on file  Social History Narrative   Caffeine: 1 cup coffee/day   Lives with husband, outside dog   Smoking vapes.   Pt tans regularly - at her pool and in her garden. Wears sunscreen    Outpatient Encounter Medications as of 12/22/2018  Medication Sig  . albuterol (PROVENTIL HFA;VENTOLIN HFA) 108 (90 Base) MCG/ACT inhaler TAKE 2 PUFFS BY MOUTH EVERY 6 HOURS AS NEEDED FOR WHEEZE OR SHORTNESS OF BREATH  . Apoaequorin (PREVAGEN PO) Take 1 capsule by mouth daily.  Marland Kitchen aspirin EC 81 MG tablet Take 81 mg by mouth daily.    . B Complex-Biotin-FA (B-COMPLEX PO) Take 1 tablet by mouth daily.  . Calcium Carbonate-Vitamin D (CALCIUM 600+D) 600-200 MG-UNIT TABS Take 1 tablet by mouth 2 (two) times daily.    . Multiple Vitamin (MULTIVITAMIN) tablet Take 1 tablet by mouth daily.    Marland Kitchen OVER THE COUNTER MEDICATION Take 1 capsule by mouth daily. SEROVITAL [place]  . vitamin B-12 (CYANOCOBALAMIN) 100 MCG tablet Take 100 mcg by mouth daily.    . [DISCONTINUED] predniSONE (DELTASONE) 20 MG tablet Take two tablets daily for 3 days followed by one tablet daily for 4 days   No  facility-administered encounter medications on file as of 12/22/2018.     Activities of Daily Living In your present state of health, do you have any difficulty performing the following activities: 12/22/2018  Hearing? N  Vision? N  Difficulty concentrating or making decisions? N  Walking or climbing stairs? N  Dressing or bathing? N  Doing errands, shopping? N  Preparing Food and eating ? N  Using the Toilet? N  In the past six months, have you accidently leaked urine? N  Do you have problems with loss of bowel control? N  Managing your Medications? N  Managing your Finances? N  Housekeeping or managing your Housekeeping? N  Some recent data might be hidden    Patient Care Team: Ria Bush, MD as PCP - General (Family Medicine)    Assessment:   This is a routine wellness examination for Stephnie.   Hearing Screening   125Hz  250Hz  500Hz  1000Hz  2000Hz  3000Hz  4000Hz  6000Hz  8000Hz   Right ear:   40 40 40  40    Left ear:   40 40 40  40    Vision Screening Comments: Vision exam in 2019    Exercise Activities and Dietary recommendations Current Exercise Habits: Home exercise routine, Type of exercise: walking, Time (Minutes): 35, Frequency (Times/Week): 7, Weekly Exercise (Minutes/Week): 245, Intensity: Mild, Exercise limited by: None identified  Goals    . Increase physical activity     Starting 12/22/2018, I will continue to walk at least 35 minutes daily.        Fall Risk Fall Risk  12/22/2018 06/11/2017  Falls in the past year? 0 No   Depression Screen PHQ 2/9 Scores 12/22/2018 06/11/2017  PHQ - 2 Score 0 0  PHQ- 9 Score 0 0     Cognitive Function MMSE - Mini Mental State Exam 12/22/2018 06/11/2017  Orientation to time 5 5  Orientation to Place 5 5  Registration 3 3  Attention/ Calculation 0 0  Recall 0 3  Recall-comments unable to recall 3 of 3 words -  Language- name 2 objects 0 0  Language- repeat 1 1  Language- follow 3 step command 3 3  Language- read &  follow direction 0 0  Write a sentence 0 0  Copy design 0 0  Total score 17 20     PLEASE NOTE: A Mini-Cog screen was completed. Maximum score is 20. A value of 0 denotes this part of [place] MMSE was not completed or the patient failed this part of the Mini-Cog screening.   Mini-Cog Screening Orientation to Time - Max 5 pts Orientation to Place - Max 5 pts Registration - Max 3 pts Recall - Max 3 pts Language Repeat - Max 1 pts Language Follow 3 Step Command - Max 3 pts     Immunization History  Administered Date(s) Administered  . Influenza Whole 07/19/2008, 08/24/2012  . Influenza, High Dose Seasonal PF 08/11/2018  . Influenza, Seasonal, Injecte, Preservative Fre 07/13/2013  . Influenza,inj,Quad PF,6+ Mos 08/22/2015, 06/26/2017  . Influenza-Unspecified 08/16/2016  . Pneumococcal Conjugate-13 06/11/2017  . Pneumococcal Polysaccharide-23 01/01/2012  . Td 04/04/2004  . Tdap 02/01/2016  . Zoster 10/13/2013    Screening Tests Health Maintenance  Topic Date Due  . PNA vac Low Risk Adult (2 of 2 - PPSV23) 10/14/2019 (Originally 06/11/2018)  . MAMMOGRAM  06/26/2019  . COLONOSCOPY  06/26/2025  . DTaP/Tdap/Td (2 - Td) 01/31/2026  . TETANUS/TDAP  01/31/2026  . INFLUENZA VACCINE  Completed  . DEXA SCAN  Completed  . Hepatitis C Screening  Completed        Plan:     I have personally reviewed, addressed, and noted the following in the patient's chart:  A. Medical and social history B. Use of alcohol, tobacco or illicit drugs  C. Current medications and supplements D. Functional ability and status E.  Nutritional status F.  Physical activity G. Advance directives H. List of other physicians I.  Hospitalizations, surgeries, and ER visits in previous 12 months J.  [place] to include hearing, vision, cognitive, depression L. Referrals and appointments - none  In addition, I have reviewed and discussed with patient certain preventive protocols, quality  metrics, and best practice recommendations. A written personalized care plan for preventive services as well as general preventive health recommendations were provided to patient.  See attached scanned questionnaire for additional information.   Signed,   Lindell Noe, MHA, BS, LPN Health Coach

## 2018-12-24 ENCOUNTER — Encounter: Payer: Self-pay | Admitting: Family Medicine

## 2018-12-24 ENCOUNTER — Telehealth: Payer: Self-pay | Admitting: Family Medicine

## 2018-12-24 ENCOUNTER — Encounter: Payer: Medicare HMO | Admitting: Family Medicine

## 2018-12-24 NOTE — Progress Notes (Deleted)
There were no vitals taken for this visit.   CC: CPE Subjective:    Patient ID: Lindsay Mason, female    DOB: 05/04/1951, 68 y.o.   MRN: [id number]  HPI: Lindsay Mason is a 68 y.o. female presenting on 12/24/2018 for No chief complaint on file.    Saw [name] this week for medicare wellness visit. Note reviewed.    Preventative: COLONOSCOPY Date: 06/2015 microscopic colitis Lindsay Mason) - pt never returned for f/u.  Breast cancer screening - mammogram today WNL Well woman exam - last well woman exam with [name] 02/2016 - WNL. Discussed considering pelvic exam every 3 yrs.  DEXA Date: 06/2017 - hip -2.2, forearm -1.9. Scoliosis. Good calcium in diet. Avoids dairy but eats greek yogurt. Last vit D 72 (2012). Regularly gets weight bearing exercises.  Lung cancer screening - 02/2016 stable. Continues yearly.  Flu shot yearly Td 2005, Tdap 01/2016 Pneumovax 12/2011, prevnar 05/2017.  zostavax - 2014 shingrix - discussed Advanced directive discussion - she has this at home. Asked to bring [place] copy. Husband [name] would be HCPOA.  Seat belt use discussed Sunscreen use and skin screen discussed. Tans regularly.  Ex smoker - quit 07/2015!  Alcohol - rare  Caffeine: 1 cup coffee/day  Lives with husband, outside dog  Activity: Very active at her pool and in her garden.      Relevant past medical, surgical, family and social history reviewed and updated as indicated. Interim medical history since our last visit reviewed. Allergies and medications reviewed and updated. Outpatient Medications Prior to Visit  Medication Sig Dispense Refill  . albuterol (PROVENTIL HFA;VENTOLIN HFA) 108 (90 Base) MCG/ACT inhaler TAKE 2 PUFFS BY MOUTH EVERY 6 HOURS AS NEEDED FOR WHEEZE OR SHORTNESS OF BREATH 8.5 Inhaler 3  . Apoaequorin (PREVAGEN PO) Take 1 capsule by mouth daily.    Marland Kitchen aspirin EC 81 MG tablet Take 81 mg by mouth daily.      . B Complex-Biotin-FA (B-COMPLEX PO) Take 1 tablet by mouth daily.    .  Calcium Carbonate-Vitamin D (CALCIUM 600+D) 600-200 MG-UNIT TABS Take 1 tablet by mouth 2 (two) times daily.      . Multiple Vitamin (MULTIVITAMIN) tablet Take 1 tablet by mouth daily.      Marland Kitchen OVER THE COUNTER MEDICATION Take 1 capsule by mouth daily. SEROVITAL [place]    . vitamin B-12 (CYANOCOBALAMIN) 100 MCG tablet Take 100 mcg by mouth daily.       No facility-administered medications prior to visit.      Per HPI unless specifically indicated in ROS section below Review of Systems  Constitutional: Negative for activity change, appetite change, chills, fatigue, fever and unexpected weight change.  HENT: Negative for hearing loss.   Eyes: Negative for visual disturbance.  Respiratory: Negative for cough, chest tightness, shortness of breath and wheezing.   Cardiovascular: Negative for chest pain, palpitations and leg swelling.  Gastrointestinal: Negative for abdominal distention, abdominal pain, blood in stool, constipation, diarrhea, nausea and vomiting.  Genitourinary: Negative for difficulty urinating and hematuria.  Musculoskeletal: Negative for arthralgias, myalgias and neck pain.  Skin: Negative for rash.  Neurological: Negative for dizziness, seizures, syncope and headaches.  Hematological: Negative for adenopathy. Does not bruise/bleed easily.  Psychiatric/Behavioral: Negative for dysphoric mood. The patient is not nervous/anxious.    Objective:    There were no vitals taken for this visit.  Wt Readings from Last 3 Encounters:  12/22/18 145 lb 8 oz (66 kg)  01/28/18 131 lb  4 oz (59.5 kg)  12/18/17 130 lb (59 kg)    Physical Exam Vitals signs and nursing note reviewed.  Constitutional:      General: She is not in acute distress.    Appearance: She is well-developed.  HENT:     Head: Normocephalic and atraumatic.     Right Ear: Hearing, tympanic membrane, ear canal and external ear normal.     Left Ear: Hearing, tympanic membrane, ear canal and external ear normal.      Nose: Nose normal.     Mouth/Throat:     Mouth: Mucous membranes are moist.     Pharynx: Uvula midline. No oropharyngeal exudate or posterior oropharyngeal erythema.  Eyes:     General: No scleral icterus.    Conjunctiva/sclera: Conjunctivae normal.     Pupils: Pupils are equal, round, and reactive to light.  Neck:     Musculoskeletal: Normal range of motion and neck supple.  Cardiovascular:     Rate and Rhythm: Normal rate and regular rhythm.     Pulses: Normal pulses.          Radial pulses are 2+ on the right side and 2+ on the left side.     Heart sounds: Normal heart sounds. No murmur.  Pulmonary:     Effort: Pulmonary effort is normal. No respiratory distress.     Breath sounds: Normal breath sounds. No wheezing, rhonchi or rales.  Abdominal:     General: Bowel sounds are normal. There is no distension.     Palpations: Abdomen is soft. There is no mass.     Tenderness: There is no abdominal tenderness. There is no guarding or rebound.  Musculoskeletal: Normal range of motion.  Lymphadenopathy:     Cervical: No cervical adenopathy.  Skin:    General: Skin is warm and dry.     Findings: No rash.  Neurological:     Mental Status: She is alert and oriented to person, place, and time.     Comments: CN grossly intact, station and gait intact  Psychiatric:        Mood and Affect: Mood normal.        Behavior: Behavior normal.        Thought Content: Thought content normal.        Judgment: Judgment normal.       Results for orders placed or performed in visit on 12/22/18  Lipid panel  Result Value Ref Range   Cholesterol 197 0 - 200 mg/dL   Triglycerides 57.0 0.0 - 149.0 mg/dL   HDL 98.70 >39.00 mg/dL   VLDL 11.4 0.0 - 40.0 mg/dL   LDL Cholesterol 87 0 - 99 mg/dL   Total CHOL/HDL Ratio 2    NonHDL 98.67   Comprehensive metabolic panel  Result Value Ref Range   Sodium 144 135 - 145 mEq/L   Potassium 4.8 3.5 - 5.1 mEq/L   Chloride 106 96 - 112 mEq/L   CO2 32 19 - 32  mEq/L   Glucose, Bld 88 70 - 99 mg/dL   BUN 19 6 - 23 mg/dL   Creatinine, Ser 0.74 0.40 - 1.20 mg/dL   Total Bilirubin 0.6 0.2 - 1.2 mg/dL   Alkaline Phosphatase 58 39 - 117 U/L   AST 21 0 - 37 U/L   ALT 15 0 - 35 U/L   Total Protein 6.0 6.0 - 8.3 g/dL   Albumin 4.2 3.5 - 5.2 g/dL   Calcium 9.4 8.4 - 10.5 mg/dL  GFR 78.08 >60.00 mL/min  TSH  Result Value Ref Range   TSH 2.25 0.35 - 4.50 uIU/mL  CBC with Differential/Platelet  Result Value Ref Range   WBC 4.7 4.0 - 10.5 K/uL   RBC 4.26 3.87 - 5.11 Mil/uL   Hemoglobin 14.0 12.0 - 15.0 g/dL   HCT 41.6 36.0 - 46.0 %   MCV 97.7 78.0 - 100.0 fl   MCHC 33.5 30.0 - 36.0 g/dL   RDW 13.0 11.5 - 15.5 %   Platelets 221.0 150.0 - 400.0 K/uL   Neutrophils Relative % 66.5 43.0 - 77.0 %   Lymphocytes Relative 22.1 12.0 - 46.0 %   Monocytes Relative 6.6 3.0 - 12.0 %   Eosinophils Relative 3.5 0.0 - 5.0 %   Basophils Relative 1.3 0.0 - 3.0 %   Neutro Abs 3.1 1.4 - 7.7 K/uL   Lymphs Abs 1.0 0.7 - 4.0 K/uL   Monocytes Absolute 0.3 0.1 - 1.0 K/uL   Eosinophils Absolute 0.2 0.0 - 0.7 K/uL   Basophils Absolute 0.1 0.0 - 0.1 K/uL   Assessment & Plan:   Problem List Items Addressed This Visit    None       No orders of the defined types were placed in this encounter.  No orders of the defined types were placed in this encounter.   Follow up plan: No follow-ups on file.  Ria Bush, MD

## 2018-12-24 NOTE — Telephone Encounter (Signed)
Left message asking pt to call office  Please r/s her cpx that pt had with dr Darnell Level this morning that was canceled due to computer problems

## 2018-12-25 NOTE — Telephone Encounter (Signed)
3/11 with lisa 3/17 cpx

## 2019-02-02 NOTE — Progress Notes (Signed)
I reviewed health advisor's note, was available for consultation, and agree with documentation and plan.  

## 2019-02-26 ENCOUNTER — Ambulatory Visit (INDEPENDENT_AMBULATORY_CARE_PROVIDER_SITE_OTHER): Payer: Medicare HMO | Admitting: Family Medicine

## 2019-02-26 ENCOUNTER — Encounter: Payer: Self-pay | Admitting: Family Medicine

## 2019-02-26 ENCOUNTER — Other Ambulatory Visit: Payer: Self-pay

## 2019-02-26 VITALS — BP 136/86 | HR 79 | Temp 99.0°F | Ht 62.0 in | Wt 139.6 lb

## 2019-02-26 DIAGNOSIS — M542 Cervicalgia: Secondary | ICD-10-CM | POA: Insufficient documentation

## 2019-02-26 DIAGNOSIS — Z23 Encounter for immunization: Secondary | ICD-10-CM | POA: Diagnosis not present

## 2019-02-26 MED ORDER — METHOCARBAMOL 500 MG PO TABS: 500.0000 mg | ORAL_TABLET | Freq: Three times a day (TID) | ORAL | 0 refills | Status: DC | PRN

## 2019-02-26 NOTE — Patient Instructions (Addendum)
Pneumovax today (final pneumonia shot). I think you have neck strain. Continue tylenol alternating with aleve as up to now. Add muscle relaxant robaxin 500mg  three times daily as needed - caution with sedation.  Do exercises provided today Use heating pad to the neck, not directly on the skin but covered in a towel.  Let us know if not improving with this, for neck xrays.

## 2019-02-26 NOTE — Assessment & Plan Note (Addendum)
Story/exam more consistent with cervical neck strain. No red flags. rec treat conservatively with continued alternating tylenol and nsaid as well as add robaxin muscle relaxant with sedation precautions. Discussed gentle stretching (provided exercises) and heating pad. Update if not improving to consider cervical neck films.

## 2019-02-26 NOTE — Progress Notes (Signed)
This visit was conducted in person.  BP 136/86 (BP Location: Right Arm, Patient Position: Sitting, Cuff Size: Normal)   Pulse 79   Temp 99 F (37.2 C) (Oral)   Ht 5\' 2"  (1.575 m)   Wt 139 lb 9 oz (63.3 kg)   SpO2 100%   BMI 25.53 kg/m    CC: R shoulder/neck pain Subjective:    Patient ID: Lindsay Mason, female    DOB: November 12, 1950, 68 y.o.   MRN: [id number]  HPI: Lindsay Mason is a 68 y.o. female presenting on 02/26/2019 for Neck Pain (C/o neck pain. Describes as constant, dull pain. Started about 1.5 wk ago. Tried Tylenol, barely helpful. )   1.5 wk h/o R shoulder pain with radiation to R neck.  No shooting pain down arm, numbness or significant weakness of arm.  Denies inciting trauma/injury or falls.  Tried tylenol and aleve (alternating) without benefit.   She walks 36min-1 hr daily. Notes worsening pain with walking.  No h/o neck or shoulder pain or surgeries in the past.      Relevant past medical, surgical, family and social history reviewed and updated as indicated. Interim medical history since our last visit reviewed. Allergies and medications reviewed and updated. Outpatient Medications Prior to Visit  Medication Sig Dispense Refill  . albuterol (PROVENTIL HFA;VENTOLIN HFA) 108 (90 Base) MCG/ACT inhaler TAKE 2 PUFFS BY MOUTH EVERY 6 HOURS AS NEEDED FOR WHEEZE OR SHORTNESS OF BREATH 8.5 Inhaler 3  . aspirin EC 81 MG tablet Take 81 mg by mouth daily.      . B Complex-Biotin-FA (B-COMPLEX PO) Take 1 tablet by mouth daily.    . Calcium Carbonate-Vitamin D (CALCIUM 600+D) 600-200 MG-UNIT TABS Take 1 tablet by mouth 2 (two) times daily.      . Multiple Vitamin (MULTIVITAMIN) tablet Take 1 tablet by mouth daily.      Marland Kitchen OVER THE COUNTER MEDICATION Take 1 capsule by mouth daily. SEROVITAL [place]    . vitamin B-12 (CYANOCOBALAMIN) 100 MCG tablet Take 100 mcg by mouth daily.      Marland Kitchen Apoaequorin (PREVAGEN PO) Take 1 capsule by mouth daily.     No facility-administered  medications prior to visit.      Per HPI unless specifically indicated in ROS section below Review of Systems Objective:    BP 136/86 (BP Location: Right Arm, Patient Position: Sitting, Cuff Size: Normal)   Pulse 79   Temp 99 F (37.2 C) (Oral)   Ht 5\' 2"  (1.575 m)   Wt 139 lb 9 oz (63.3 kg)   SpO2 100%   BMI 25.53 kg/m   Wt Readings from Last 3 Encounters:  02/26/19 139 lb 9 oz (63.3 kg)  12/22/18 145 lb 8 oz (66 kg)  01/28/18 131 lb 4 oz (59.5 kg)    Physical Exam Vitals signs and nursing note reviewed.  Neck:     Musculoskeletal: Normal range of motion and neck supple. No neck rigidity or muscular tenderness.  Musculoskeletal: Normal range of motion.        General: Tenderness present. No swelling.     Comments:  No midline cervical spine tenderness ++ tender to palpation R paracervical mm, no pain to palpation at R occiput FROM at neck L shoulder WNL R shoulder exam: No deformity of shoulders on inspection. Discomfort to of posterior shoulder at trapezius mm on right FROM in abduction and forward flexion. No pain or weakness with testing SITS in ext/int rotation. No pain with  empty can sign. Neg Speed test. No impingement. No pain with crossover test. No pain with rotation of humeral head in [place] joint.        Assessment & Plan:  Final pneumovax today Problem List Items Addressed This Visit    Neck pain, acute - Primary    Story/exam more consistent with cervical neck strain. No red flags. rec treat conservatively with continued alternating tylenol and nsaid as well as add robaxin muscle relaxant with sedation precautions. Discussed gentle stretching (provided exercises) and heating pad. Update if not improving to consider cervical neck films.        Other Visit Diagnoses    Need for 23-polyvalent pneumococcal polysaccharide vaccine       Relevant Orders   Pneumococcal polysaccharide vaccine 23-valent greater than or equal to 2yo subcutaneous/IM (Completed)        Meds ordered this encounter  Medications  . methocarbamol (ROBAXIN) 500 MG tablet    Sig: Take 1 tablet (500 mg total) by mouth 3 (three) times daily as needed for muscle spasms (sedation precautions).    Dispense:  30 tablet    Refill:  0   Orders Placed This Encounter  Procedures  . Pneumococcal polysaccharide vaccine 23-valent greater than or equal to 2yo subcutaneous/IM    Follow up plan: Return if symptoms worsen or fail to improve.  Ria Bush, MD

## 2019-04-06 ENCOUNTER — Other Ambulatory Visit: Payer: Self-pay | Admitting: *Deleted

## 2019-04-06 DIAGNOSIS — Z122 Encounter for screening for malignant neoplasm of respiratory organs: Secondary | ICD-10-CM

## 2019-04-06 DIAGNOSIS — Z87891 Personal history of nicotine dependence: Secondary | ICD-10-CM

## 2019-05-26 ENCOUNTER — Encounter (INDEPENDENT_AMBULATORY_CARE_PROVIDER_SITE_OTHER): Payer: Self-pay

## 2019-05-26 ENCOUNTER — Other Ambulatory Visit: Payer: Self-pay

## 2019-05-26 ENCOUNTER — Ambulatory Visit (INDEPENDENT_AMBULATORY_CARE_PROVIDER_SITE_OTHER)
Admission: RE | Admit: 2019-05-26 | Discharge: 2019-05-26 | Disposition: A | Discharge status: Home / Self Care | Payer: Medicare HMO | Source: Ambulatory Visit | Attending: Acute Care | Admitting: Acute Care

## 2019-05-26 DIAGNOSIS — Z87891 Personal history of nicotine dependence: Secondary | ICD-10-CM | POA: Diagnosis not present

## 2019-05-26 DIAGNOSIS — Z122 Encounter for screening for malignant neoplasm of respiratory organs: Secondary | ICD-10-CM

## 2019-06-04 ENCOUNTER — Other Ambulatory Visit: Payer: Self-pay | Admitting: *Deleted

## 2019-06-04 DIAGNOSIS — Z122 Encounter for screening for malignant neoplasm of respiratory organs: Secondary | ICD-10-CM

## 2019-06-04 DIAGNOSIS — Z87891 Personal history of nicotine dependence: Secondary | ICD-10-CM

## 2019-06-14 ENCOUNTER — Encounter: Payer: Self-pay | Admitting: Family Medicine

## 2019-06-14 DIAGNOSIS — R911 Solitary pulmonary nodule: Secondary | ICD-10-CM | POA: Insufficient documentation

## 2019-07-16 ENCOUNTER — Telehealth: Payer: Self-pay | Admitting: Acute Care

## 2019-07-20 NOTE — Telephone Encounter (Signed)
Lindsay Mason, it looks like pt is returning your call.

## 2019-07-20 NOTE — Telephone Encounter (Signed)
I now have Lindsay Mason's 3 month follow up CT scheduled and she is aware the appt is 08/27/2019 @ 1:00pm

## 2019-08-27 ENCOUNTER — Ambulatory Visit: Admission: RE | Admit: 2019-08-27 | Payer: Medicare HMO | Source: Ambulatory Visit

## 2019-08-27 ENCOUNTER — Ambulatory Visit (INDEPENDENT_AMBULATORY_CARE_PROVIDER_SITE_OTHER): Payer: Medicare HMO

## 2019-08-27 ENCOUNTER — Encounter: Payer: Self-pay | Admitting: Emergency Medicine

## 2019-08-27 ENCOUNTER — Other Ambulatory Visit: Payer: Self-pay | Admitting: Family Medicine

## 2019-08-27 ENCOUNTER — Telehealth: Payer: Self-pay

## 2019-08-27 ENCOUNTER — Other Ambulatory Visit: Payer: Self-pay

## 2019-08-27 ENCOUNTER — Ambulatory Visit
Admission: EM | Admit: 2019-08-27 | Discharge: 2019-08-27 | Disposition: A | Discharge status: Home / Self Care | Payer: Medicare HMO

## 2019-08-27 DIAGNOSIS — R52 Pain, unspecified: Secondary | ICD-10-CM

## 2019-08-27 DIAGNOSIS — M25552 Pain in left hip: Secondary | ICD-10-CM

## 2019-08-27 MED ORDER — METHOCARBAMOL 500 MG PO TABS: 500.0000 mg | ORAL_TABLET | Freq: Three times a day (TID) | ORAL | 0 refills | Status: AC | PRN

## 2019-08-27 NOTE — Discharge Instructions (Addendum)
Recommend RICE: rest, ice, compression, elevation as needed for pain.    Heat therapy (hot compress, warm wash red, hot showers, etc.) can help relax muscles and soothe muscle aches. Cold therapy (ice packs) can be used to help swelling both after injury and after prolonged use of areas of chronic pain/aches.  For pain: recommend 350 mg-1000 mg of Tylenol (acetaminophen) and/or 200 mg - 800 mg of Advil (ibuprofen, Motrin) every 8 hours as needed.  May alternate between the two throughout the day as they are generally safe to take together.  DO NOT exceed more than 3000 mg of Tylenol or 3200 mg of ibuprofen in a 24 hour period as this could damage your stomach, kidneys, liver, or increase your bleeding risk.  May take muscle relaxer as needed for severe pain / spasm.  (This medication may cause you to become tired so it is important you do not drink alcohol or operate heavy machinery while on this medication.  Recommend your first dose to be taken before bedtime to monitor for side effects safely)  Important to follow-up with primary care provider to discuss memory.

## 2019-08-27 NOTE — ED Provider Notes (Signed)
[facility]    CSN: [id number] Arrival date & time: 08/27/19  1526      History   Chief Complaint Chief Complaint  Patient presents with  . Fall    HPI Lindsay Mason is a 68 y.o. female with history of COPD, osteoporosis on Actonel presenting for persistent left lateral and posterior hip pain status post fall 5 to 7 days ago.  Patient denies previous evaluation today for this.  Patient has had worsening pain with ambulation.  Pain is nonradiating.  Has been using heat, APAP, "walking slow "with moderate relief.  Pain is worse at night.  This provider was able to speak with patient's husband, [name], regarding history: States that patient does have baseline of "memory issues" that have not yet been worked up.  States that fall did happen around 5 to 7 days ago and that patient has been without significant memory impairment, chest pain, difficulty breathing since.  No history of blood clot, hip fracture.   Past Medical History:  Diagnosis Date  . 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 Allergy   . CAP (community acquired pneumonia) 12/2011  . Chest pain    normal ETT 10/2011  . 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 Ex-smoker 08/2014   currently using e cig  . Microscopic colitis 06/2015   lymphocytic by colonoscopy - started entocort [name]  . Osteoporosis, unspecified   . Scoliosis of lumbar spine   . Thoracic aortic atherosclerosis ([place]) 02/2016   by CT    Patient Active Problem List   Diagnosis Date Noted  . Pulmonary nodule, right 06/14/2019  . Neck pain, acute 02/26/2019  . Cough 01/28/2018  . Dyspnea 10/10/2017  . Advanced care planning/counseling discussion 06/26/2017  . 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 COPD with emphysema ([place]) 02/12/2016  . Thoracic aortic atherosclerosis ([place]) 02/12/2016  . Health maintenance examination 02/01/2016  . Microscopic colitis 06/05/2015  . Strain of groin, left,  subsequent encounter 04/07/2014  . Abnormal MRI of head 10/02/2011  . Ex-smoker 04/30/2007  . Osteopenia 04/30/2007    Past Surgical History:  Procedure Laterality Date  . COLONOSCOPY  06/2015   microscopic colitis [name]  . DEXA  10/2008   Osteoporosis  . DEXA  02/2014   T score osteopenia hip, osteoporosis spine, scoliosis  . ETT  10/2011   WNL, no evidence of ischemia, excellent exercise tolerance  . EXPLORATORY LAPAROTOMY    . MRI head  07/2011   multiple foci deep and subcortical white matter, chronic ischemic vs demyelinating, no active disease  . TONSILLECTOMY AND ADENOIDECTOMY    . Vein removal     removed from legs    OB History   No obstetric history on file.      Home Medications    Prior to Admission medications   Medication Sig Start Date End Date Taking? Authorizing Provider  risedronate (ACTONEL) 5 MG tablet Take 5 mg by mouth daily before breakfast. with water on empty stomach, nothing by mouth or lie down for next 30 minutes.   Yes [provider]  albuterol (PROVENTIL HFA;VENTOLIN HFA) 108 (90 Base) MCG/ACT inhaler TAKE 2 PUFFS BY MOUTH EVERY 6 HOURS AS NEEDED FOR WHEEZE OR SHORTNESS OF BREATH 10/19/18   Ria Bush, MD  aspirin EC 81 MG tablet Take 81 mg by mouth daily.      [provider]  B Complex-Biotin-FA (B-COMPLEX PO) Take 1  tablet by mouth daily.    [provider]  Calcium Carbonate-Vitamin D (CALCIUM 600+D) 600-200 MG-UNIT TABS Take 1 tablet by mouth 2 (two) times daily.      [provider]  methocarbamol (ROBAXIN) 500 MG tablet Take 1 tablet (500 mg total) by mouth 3 (three) times daily as needed for up to 2 days for muscle spasms (sedation precautions). 08/27/19 08/29/19  Hall-Potvin, [place], PA-C  Multiple Vitamin (MULTIVITAMIN) tablet Take 1 tablet by mouth daily.      [provider]  OVER THE COUNTER MEDICATION Take 1 capsule by mouth daily. Loretto Leake    [provider]   vitamin B-12 (CYANOCOBALAMIN) 100 MCG tablet Take 100 mcg by mouth daily.      [provider]    Family History Family History  Problem Relation Age of Onset  . Osteoporosis Other        great grandmother  . Stroke Maternal Grandmother   . Coronary artery disease Maternal Grandfather 50  . Diabetes Maternal Grandfather   . Coronary artery disease Paternal Grandmother 80  . Coronary artery disease Paternal Grandfather 65  . Lung cancer Maternal Aunt     Social History Social History   Tobacco Use  . Smoking status: Former Smoker    Packs/day: 1.00    Years: 30.00    Pack years: 30.00    Types: Cigarettes    Start date: 10/14/1978    Quit date: 07/15/2015    Years since quitting: 4.1  . Smokeless tobacco: Never Used  . Tobacco comment: off and on smoker, now e-cigarettes  Substance Use Topics  . Alcohol use: Yes    Alcohol/week: 0.0 standard drinks    Comment: social wine once a week  . Drug use: No     Allergies   Penicillins   Review of Systems Review of Systems  Constitutional: Negative for fatigue and fever.  HENT: Negative for ear pain, sinus pain, sore throat and voice change.   Eyes: Negative for pain, redness and visual disturbance.  Respiratory: Negative for cough and shortness of breath.   Cardiovascular: Negative for chest pain and palpitations.  Gastrointestinal: Negative for abdominal pain, diarrhea and vomiting.  Musculoskeletal:       Positive for left hip pain  Skin: Negative for rash and wound.  Neurological: Negative for syncope and headaches.     Physical Exam Triage Vital Signs ED Triage Vitals  Enc Vitals Group     BP 08/27/19 1534 (!) 162/87     Pulse Rate 08/27/19 1534 73     Resp 08/27/19 1534 (!) 22     Temp 08/27/19 1534 97.9 F (36.6 C)     Temp Source 08/27/19 1534 Oral     SpO2 08/27/19 1534 94 %     Weight --      Height --      Head Circumference --      Peak Flow --      Pain Score 08/27/19 1535 8     Pain  Loc --      Pain Edu? --      Excl. in [place]? --    No data found.  Updated Vital Signs BP (!) 162/87 (BP Location: Right Arm)   Pulse 73   Temp 97.9 F (36.6 C) (Oral)   Resp 16 Comment: once settled when talking to provider  SpO2 94%   Visual Acuity Right Eye Distance:   Left Eye Distance:   Bilateral Distance:  Right Eye Near:   Left Eye Near:    Bilateral Near:     Physical Exam Constitutional:      General: She is not in acute distress. HENT:     Head: Normocephalic and atraumatic.     Mouth/Throat:     Mouth: Mucous membranes are moist.     Pharynx: Oropharynx is clear.  Eyes:     General: No scleral icterus.    Extraocular Movements: Extraocular movements intact.     Pupils: Pupils are equal, round, and reactive to light.     Comments: Negative nystagmus  Cardiovascular:     Rate and Rhythm: Normal rate and regular rhythm.     Heart sounds: No murmur. No gallop.   Pulmonary:     Effort: Pulmonary effort is normal. No respiratory distress.     Breath sounds: No rales.  Musculoskeletal:     Left hip: She exhibits normal range of motion, normal strength, no swelling, no crepitus, no deformity and no laceration.       Legs:     Comments: Feet legs symmetric, legs equidistant.  Gait is normal  Skin:    Capillary Refill: Capillary refill takes less than 2 seconds.     Coloration: Skin is not jaundiced or pale.     Findings: No bruising.  Neurological:     Mental Status: She is alert and oriented to person, place, and time.     Cranial Nerves: No cranial nerve deficit.     Sensory: No sensory deficit.     Motor: No weakness.     Coordination: Coordination normal.     Gait: Gait normal.     Deep Tendon Reflexes: Reflexes normal.     Comments: Patient alert and oriented to person, place, today state, though cannot recall current year      UC Treatments / Results  Labs (all labs ordered are listed, but only abnormal results are displayed) Labs Reviewed -  No data to display  EKG   Radiology Dg Hip Unilat With Pelvis 2-3 Views Left  Result Date: 08/27/2019 CLINICAL DATA:  LEFT hip and buttock pain after a fall 10 days ago EXAM: DG HIP (WITH OR WITHOUT PELVIS) 2-3V LEFT COMPARISON:  06/24/2017 FINDINGS: Diffuse osseous demineralization. Hip and SI joint spaces preserved. Deformity of the LEFT pubic body consistent with old healed fracture. No definite acute fracture, dislocation, or bone destruction. Degenerative disc and facet disease changes of visualized lower lumbar spine with scoliosis. IMPRESSION: Osseous demineralization with old healed fracture of LEFT pubic body. No definite acute osseous abnormalities. Multilevel degenerative disc and facet disease changes of visualized lumbar spine with scoliosis. Electronically Signed   By: Lavonia Dana M.D.   On: 08/27/2019 16:34    Procedures Procedures (including critical care time)  Medications Ordered in UC Medications - No data to display  Initial Impression / Assessment and Plan / UC Course  I have reviewed the triage vital signs and the nursing notes.  Pertinent labs & imaging results that were available during my care of the patient were reviewed by me and considered in my medical decision making (see chart for details).     Left hip x-ray obtained in office, reviewed by me radiology: Negative for acute fracture, dislocation, bone destruction.  Patient does have deformity of left pubic body consistent with old, healed fracture.  Reviewed findings with patient who verbalized understanding.  Will initiate RICE as opposed to heating pads, and have patient follow-up with sports medicine  in the next week should symptoms persist, worsen.  Low concern for DVT at this time, though did discuss strict ER return precautions.  Patient verbalized understanding, agreeable to plan. Final Clinical Impressions(s) / UC Diagnoses   Final diagnoses:  Pain  Left hip pain     Discharge Instructions      Recommend RICE: rest, ice, compression, elevation as needed for pain.    Heat therapy (hot compress, warm wash red, hot showers, etc.) can help relax muscles and soothe muscle aches. Cold therapy (ice packs) can be used to help swelling both after injury and after prolonged use of areas of chronic pain/aches.  For pain: recommend 350 mg-1000 mg of Tylenol (acetaminophen) and/or 200 mg - 800 mg of Advil (ibuprofen, Motrin) every 8 hours as needed.  May alternate between the two throughout the day as they are generally safe to take together.  DO NOT exceed more than 3000 mg of Tylenol or 3200 mg of ibuprofen in a 24 hour period as this could damage your stomach, kidneys, liver, or increase your bleeding risk.  May take muscle relaxer as needed for severe pain / spasm.  (This medication may cause you to become tired so it is important you do not drink alcohol or operate heavy machinery while on this medication.  Recommend your first dose to be taken before bedtime to monitor for side effects safely)  Important to follow-up with primary care provider to discuss memory.    ED Prescriptions    Medication Sig Dispense Auth. Provider   methocarbamol (ROBAXIN) 500 MG tablet Take 1 tablet (500 mg total) by mouth 3 (three) times daily as needed for up to 2 days for muscle spasms (sedation precautions). 6 tablet Hall-Potvin, [place], PA-C     PDMP not reviewed this encounter.   Neldon Mc [place], [place] 08/27/19 1951

## 2019-08-27 NOTE — Telephone Encounter (Signed)
Patient's husband called stating that patient fell over a week ago on the concrete and hurt her right hip. She has had severe pain since then and is still hurting. Patient has been applying Lidocaine patches to the affected area, has taking Aleve and Tylenol with not much of a relief. Advised Mr. Thew that we do not have any openings and I spoke with Dr. Darnell Level and advised for patient to head to Urgent Care for evaluation and that patient should not wait until Monday. Mr Cortez verbalized understanding and they will go to urgent care on [address].

## 2019-08-27 NOTE — Telephone Encounter (Addendum)
Agree with this. Seen at [facility] with stable xray.  Note pending.

## 2019-08-27 NOTE — ED Notes (Signed)
Patient able to ambulate independently  

## 2019-08-27 NOTE — ED Triage Notes (Addendum)
Pt presents to [facility] for assessment of left hip pain after falling and twisting down two steps on to her left side.  Patient has been having steady intense pain to that area since.  Pt has tried heat, ibuprofen, and aleve without relief.  Patient endorses she did hit her head on the concrete when she fell.  Denies dizziness, nausea, changes in mental status since

## 2019-08-30 ENCOUNTER — Telehealth: Payer: Self-pay | Admitting: Family Medicine

## 2019-08-30 NOTE — Telephone Encounter (Signed)
Spoke with pt relaying Dr. Synthia Innocent message.  Pt verbalizes understanding but wants to "give it a day or 2" to see how she feels then.

## 2019-08-30 NOTE — Telephone Encounter (Signed)
Spoke with pt scheduling OV on 09/03/19 at 11:15.

## 2019-08-30 NOTE — Telephone Encounter (Signed)
Patient's husband called.  Patient does need pain medication called in to pharmacy.  Patient uses [facility].

## 2019-08-30 NOTE — Telephone Encounter (Signed)
I'm glad no fracture - reviewed [facility] note  plz schedule in office visit for further evaluation if ongoing pain despite meds from [facility].

## 2019-08-30 NOTE — Telephone Encounter (Signed)
Patient's husband called today to make sure you were able to look at the patient's visit from the [facility] He stated they did xrays and luckily nothing was broke   Patient is still in pain and they wanted to know if you have any advice on what they should do next .

## 2019-09-01 LAB — HM DIABETES EYE EXAM

## 2019-09-02 ENCOUNTER — Encounter: Payer: Self-pay | Admitting: Family Medicine

## 2019-09-03 ENCOUNTER — Ambulatory Visit (INDEPENDENT_AMBULATORY_CARE_PROVIDER_SITE_OTHER): Payer: Medicare HMO | Admitting: Family Medicine

## 2019-09-03 ENCOUNTER — Telehealth: Payer: Self-pay | Admitting: Family Medicine

## 2019-09-03 ENCOUNTER — Encounter: Payer: Self-pay | Admitting: Family Medicine

## 2019-09-03 ENCOUNTER — Other Ambulatory Visit: Payer: Self-pay

## 2019-09-03 DIAGNOSIS — M25552 Pain in left hip: Secondary | ICD-10-CM | POA: Diagnosis not present

## 2019-09-03 MED ORDER — TRAMADOL HCL 50 MG PO TABS: 25.0000 mg | ORAL_TABLET | Freq: Three times a day (TID) | ORAL | 0 refills | Status: AC | PRN

## 2019-09-03 NOTE — Assessment & Plan Note (Addendum)
After fall directly onto L iliac crest - anticipate L hip pointer. Supportive care reviewed, rec continue alternating tylenol/aleve (higher dose) and will add tramadol for pain control. If no improvement over next 1-2 wks, consider further imaging of iliac crest. Pt agrees with plan. I personally reviewed last week's xray.  [place] CSRS reviewed.

## 2019-09-03 NOTE — Telephone Encounter (Signed)
Patient requested actonel refill at [place] - however reviewing chart I have not been prescribing this - who has been filling her actonel and how is she taking it? I usually dont do the 5mg  every day dosing..  Would recommend she touch base with current doc on her actonel Rx.

## 2019-09-03 NOTE — Patient Instructions (Addendum)
I think you have deep bruise of area called iliac crest of your pelvis bone "hip pointer".  Continue alternating tylenol 1000mg  with aleve 440mg  (max each one twice daily) for the next 5 days.  Add tramadol for breakthrough pain - start at 1/2 tablet as it can make you sleepy.  This will take more time to heal. If no improvement over next 1-2 wks, let me know.

## 2019-09-03 NOTE — Progress Notes (Addendum)
This visit was conducted in person.  BP 118/86 (BP Location: Left Arm, Patient Position: Sitting, Cuff Size: Normal)   Pulse 88   Temp 97.8 F (36.6 C) (Temporal)   Ht 5\' 2"  (1.575 m)   Wt 136 lb 1 oz (61.7 kg)   SpO2 97%   BMI 24.89 kg/m    CC: L hip pain Subjective:    Patient ID: Lindsay Mason, female    DOB: 07-Oct-1951, 68 y.o.   MRN: [id number]  HPI: Lindsay Mason is a 68 y.o. female presenting on 09/03/2019 for Hip Pain (C/o left hip pain described as dull throb.  Pt fell off back step at home about 2 wks ago.  Hip pain is not improving.  Tried alternating Tylenol and Aleve, not helpful. )   2-3 wk h/o L hip pain that started after fall off back step at home while walking with flip flops. Fell onto cement on her L side. She initially did have bruising, now better. Seen at [facility] last week, note reviewed. Hip xray at that time without evidence of fracture. Treating with alternating tylneol 500mg  and aleve 440mg  Q4 hours without benefit. She has also been using ice/heating pad without benefit. Points to lateral hip as side of pain.   Worse with prolonged walking - she normally walks 45-60 min/day - but has stopped due to injury. Side starts throbbing if she stands for prolonged period of time.   No alleviating factors.  H/o remote pelvic fracture.   Known osteoporosis on actonel 5mg  daily.      Relevant past medical, surgical, family and social history reviewed and updated as indicated. Interim medical history since our last visit reviewed. Allergies and medications reviewed and updated. Outpatient Medications Prior to Visit  Medication Sig Dispense Refill  . VENTOLIN HFA 108 (90 Base) MCG/ACT inhaler TAKE 2 PUFFS BY MOUTH EVERY 6 HOURS AS NEEDED FOR WHEEZE OR SHORTNESS OF BREATH 18 g 3  . aspirin EC 81 MG tablet Take 81 mg by mouth daily.      . B Complex-Biotin-FA (B-COMPLEX PO) Take 1 tablet by mouth daily.    . Calcium Carbonate-Vitamin D (CALCIUM 600+D) 600-200  MG-UNIT TABS Take 1 tablet by mouth 2 (two) times daily.      . Multiple Vitamin (MULTIVITAMIN) tablet Take 1 tablet by mouth daily.      Marland Kitchen OVER THE COUNTER MEDICATION Take 1 capsule by mouth daily. [facility] [place]    . risedronate (ACTONEL) 5 MG tablet Take 5 mg by mouth daily before breakfast. with water on empty stomach, nothing by mouth or lie down for next 30 minutes.    . vitamin B-12 (CYANOCOBALAMIN) 100 MCG tablet Take 100 mcg by mouth daily.       No facility-administered medications prior to visit.      Per HPI unless specifically indicated in ROS section below Review of Systems Objective:    BP 118/86 (BP Location: Left Arm, Patient Position: Sitting, Cuff Size: Normal)   Pulse 88   Temp 97.8 F (36.6 C) (Temporal)   Ht 5\' 2"  (1.575 m)   Wt 136 lb 1 oz (61.7 kg)   SpO2 97%   BMI 24.89 kg/m   Wt Readings from Last 3 Encounters:  09/03/19 136 lb 1 oz (61.7 kg)  02/26/19 139 lb 9 oz (63.3 kg)  12/22/18 145 lb 8 oz (66 kg)    Physical Exam Vitals signs and nursing note reviewed.  Constitutional:  General: She is not in acute distress.    Appearance: Normal appearance. She is not ill-appearing.     Comments: Uncomfortable appearing  Musculoskeletal: Normal range of motion.        General: Tenderness present.     Right lower leg: No edema.     Left lower leg: No edema.     Comments:  No pain midline spine Scoliosis present  No paraspinous mm tenderness Neg SLR bilaterally. No pain with int/ext rotation at hip. Neg FABER. No pain at SIJ, GTB or sciatic notch bilaterally.  Reproducible tenderness to palpation along superior lateral to posterior iliac crest   Skin:    General: Skin is warm and dry.     Findings: No bruising or rash.  Neurological:     General: No focal deficit present.     Mental Status: She is alert.  Psychiatric:        Mood and Affect: Mood normal.        Behavior: Behavior normal.        DG HIP UNILAT WITH PELVIS 2-3 VIEWS LEFT  CLINICAL DATA:  LEFT hip and buttock pain after a fall 10 days ago  EXAM: DG HIP (WITH OR WITHOUT PELVIS) 2-3V LEFT  COMPARISON:  06/24/2017  FINDINGS: Diffuse osseous demineralization.  Hip and SI joint spaces preserved.  Deformity of the LEFT pubic body consistent with old healed fracture.  No definite acute fracture, dislocation, or bone destruction.  Degenerative disc and facet disease changes of visualized lower lumbar spine with scoliosis.  IMPRESSION: Osseous demineralization with old healed fracture of LEFT pubic body.  No definite acute osseous abnormalities.  Multilevel degenerative disc and facet disease changes of visualized lumbar spine with scoliosis.  Electronically Signed   By: Lavonia Dana M.D.   On: 08/27/2019 16:34   Assessment & Plan:  This visit occurred during the SARS-CoV-2 public health emergency.  Safety protocols were in place, including screening questions prior to the visit, additional usage of staff PPE, and extensive cleaning of exam room while observing appropriate contact time as indicated for disinfecting solutions.   Problem List Items Addressed This Visit    Lateral pain of left hip    After fall directly onto L iliac crest - anticipate L hip pointer. Supportive care reviewed, rec continue alternating tylenol/aleve (higher dose) and will add tramadol for pain control. If no improvement over next 1-2 wks, consider further imaging of iliac crest. Pt agrees with plan. I personally reviewed last week's xray.  [place] CSRS reviewed.           Meds ordered this encounter  Medications  . traMADol (ULTRAM) 50 MG tablet    Sig: Take 0.5-1 tablets (25-50 mg total) by mouth every 8 (eight) hours as needed for up to 5 days.    Dispense:  15 tablet    Refill:  0   No orders of the defined types were placed in this encounter.  Patient Instructions  I think you have deep bruise of area called iliac crest of your pelvis bone "hip pointer".   Continue alternating tylenol 1000mg  with aleve 440mg  (max each one twice daily) for the next 5 days.  Add tramadol for breakthrough pain - start at 1/2 tablet as it can make you sleepy.  This will take more time to heal. If no improvement over next 1-2 wks, let me know.     Follow up plan: Return if symptoms worsen or fail to improve.  Ria Bush, MD

## 2019-09-03 NOTE — Telephone Encounter (Signed)
Spoke with pt relaying Dr. Synthia Innocent message.  Pt says she'll have to look up who was prescribed it and she'll contact them.

## 2019-12-13 ENCOUNTER — Ambulatory Visit: Payer: Medicare HMO | Attending: Internal Medicine

## 2019-12-13 DIAGNOSIS — Z23 Encounter for immunization: Secondary | ICD-10-CM

## 2019-12-13 NOTE — Progress Notes (Signed)
   Covid-19 Vaccination Clinic  Name:  HALLEL DENHERDER    MRN: [id number] DOB: 04-19-1951  12/13/2019  Ms. Shibata was observed post Covid-19 immunization for 15 minutes without incidence. She was provided with Vaccine Information Sheet and instruction to access the V-Safe system.   Ms. Hantz was instructed to call 911 with any severe reactions post vaccine: Marland Kitchen Difficulty breathing  . Swelling of your face and throat  . A fast heartbeat  . A bad rash all over your body  . Dizziness and weakness    Immunizations Administered    Name Date Dose VIS Date Route   Pfizer COVID-19 Vaccine 12/13/2019 12:07 PM 0.3 mL 09/24/2019 Intramuscular   Manufacturer: [place]   Lot: [id number]   Mower: [address]-4

## 2019-12-16 ENCOUNTER — Other Ambulatory Visit: Payer: Self-pay

## 2019-12-16 ENCOUNTER — Ambulatory Visit (INDEPENDENT_AMBULATORY_CARE_PROVIDER_SITE_OTHER)
Admission: RE | Admit: 2019-12-16 | Discharge: 2019-12-16 | Disposition: A | Discharge status: Home / Self Care | Payer: Medicare HMO | Source: Ambulatory Visit | Attending: Acute Care | Admitting: Acute Care

## 2019-12-16 DIAGNOSIS — Z87891 Personal history of nicotine dependence: Secondary | ICD-10-CM

## 2019-12-16 DIAGNOSIS — Z122 Encounter for screening for malignant neoplasm of respiratory organs: Secondary | ICD-10-CM

## 2019-12-21 ENCOUNTER — Other Ambulatory Visit: Payer: Self-pay | Admitting: Family Medicine

## 2019-12-21 DIAGNOSIS — K52839 Microscopic colitis, unspecified: Secondary | ICD-10-CM

## 2019-12-23 ENCOUNTER — Ambulatory Visit: Payer: Medicare HMO

## 2019-12-23 ENCOUNTER — Other Ambulatory Visit (INDEPENDENT_AMBULATORY_CARE_PROVIDER_SITE_OTHER): Payer: Medicare HMO

## 2019-12-23 ENCOUNTER — Other Ambulatory Visit: Payer: Self-pay | Admitting: Acute Care

## 2019-12-23 ENCOUNTER — Other Ambulatory Visit: Payer: Self-pay

## 2019-12-23 ENCOUNTER — Ambulatory Visit (INDEPENDENT_AMBULATORY_CARE_PROVIDER_SITE_OTHER): Payer: Medicare HMO

## 2019-12-23 DIAGNOSIS — K52839 Microscopic colitis, unspecified: Secondary | ICD-10-CM

## 2019-12-23 DIAGNOSIS — Z0000 Encounter for general adult medical examination without abnormal findings: Secondary | ICD-10-CM | POA: Diagnosis not present

## 2019-12-23 DIAGNOSIS — R911 Solitary pulmonary nodule: Secondary | ICD-10-CM

## 2019-12-23 LAB — CBC WITH DIFFERENTIAL/PLATELET
Basophils Absolute: 0 10*3/uL (ref 0.0–0.1)
Basophils Relative: 0.8 % (ref 0.0–3.0)
Eosinophils Absolute: 0.1 10*3/uL (ref 0.0–0.7)
Eosinophils Relative: 1.9 % (ref 0.0–5.0)
HCT: 43.1 % (ref 36.0–46.0)
Hemoglobin: 14.3 g/dL (ref 12.0–15.0)
Lymphocytes Relative: 27 % (ref 12.0–46.0)
Lymphs Abs: 1.3 10*3/uL (ref 0.7–4.0)
MCHC: 33.2 g/dL (ref 30.0–36.0)
MCV: 96.1 fl (ref 78.0–100.0)
Monocytes Absolute: 0.4 10*3/uL (ref 0.1–1.0)
Monocytes Relative: 8.2 % (ref 3.0–12.0)
Neutro Abs: 2.9 10*3/uL (ref 1.4–7.7)
Neutrophils Relative %: 62.1 % (ref 43.0–77.0)
Platelets: 226 10*3/uL (ref 150.0–400.0)
RBC: 4.49 Mil/uL (ref 3.87–5.11)
RDW: 13.5 % (ref 11.5–15.5)
WBC: 4.7 10*3/uL (ref 4.0–10.5)

## 2019-12-23 LAB — COMPREHENSIVE METABOLIC PANEL
ALT: 12 U/L (ref 0–35)
AST: 18 U/L (ref 0–37)
Albumin: 4.1 g/dL (ref 3.5–5.2)
Alkaline Phosphatase: 81 U/L (ref 39–117)
BUN: 23 mg/dL (ref 6–23)
CO2: 29 mEq/L (ref 19–32)
Calcium: 9.5 mg/dL (ref 8.4–10.5)
Chloride: 105 mEq/L (ref 96–112)
Creatinine, Ser: 0.84 mg/dL (ref 0.40–1.20)
GFR: 67.26 mL/min (ref >60.00)
Glucose, Bld: 99 mg/dL (ref 70–99)
Potassium: 5 mEq/L (ref 3.5–5.1)
Sodium: 140 mEq/L (ref 135–145)
Total Bilirubin: 0.8 mg/dL (ref 0.2–1.2)
Total Protein: 6.5 g/dL (ref 6.0–8.3)

## 2019-12-23 NOTE — Progress Notes (Signed)
PCP notes:  Health Maintenance: Mammogram- Patient postponed due to COVID    Abnormal Screenings: none   Patient concerns: none   Nurse concerns: none   Next PCP appt: 12/29/2019 @ 11:30 am

## 2019-12-23 NOTE — Progress Notes (Signed)
I attempted to call the results to the patient. She was not home at the time of the call. I did speak with the patient's husband ( signed DPR to speak with Tawni Pummel on file) and I explained that her scan is revealing of a pulmonary nodule that has grown in solid component in the last 6 months. ( Scan was ordered for 3 months, but patient did not have it done for 6 months).I explained that I will call back this afternoon when the patient is home to discuss further. I have also messaged her PCP with the results, and I have told him we will manage the follow up. I plan on ordereing PFT's and a PET scan to better evaluate the nodule. Based on diagnostics we will refer appropriately for follow up management and care.

## 2019-12-23 NOTE — Progress Notes (Signed)
Subjective:   Lindsay Mason is a 69 y.o. female who presents for Medicare Annual (Subsequent) preventive examination.  Review of Systems: N/A   This visit is being conducted through telemedicine via telephone at the nurse health advisor's home address due to the COVID-19 pandemic. This patient has given me verbal consent via doximity to conduct this visit, patient states they are participating from their home address. Patient and myself are on the telephone call. There is no referral for this visit. Some vital signs may be absent or patient reported.    Patient identification: identified by name, DOB, and current address   Cardiac Risk Factors include: advanced age (>38men, >48 women)     Objective:     Vitals: There were no vitals taken for this visit.  There is no height or weight on file to calculate BMI.  Advanced Directives 12/23/2019 12/22/2018 06/11/2017 06/27/2015 12/31/2011  Does Patient Have a Medical Advance Directive? No Yes Yes Yes Patient has advance directive, copy not in chart  Type of Advance Directive - [place];Living will Living will - Living will  Copy of [place] in Chart? - No - copy requested - No - copy requested Copy requested from other (Comment)  Would patient like information on creating a medical advance directive? No - Patient declined - - - -  Pre-existing out of facility DNR order (yellow form or pink MOST form) - - - - No    Tobacco Social History   Tobacco Use  Smoking Status Former Smoker  . Packs/day: 1.00  . Years: 30.00  . Pack years: 30.00  . Types: Cigarettes  . Start date: 10/14/1978  . Quit date: 07/15/2015  . Years since quitting: 4.4  Smokeless Tobacco Never Used  Tobacco Comment   off and on smoker, now e-cigarettes     Counseling given: Not Answered Comment: off and on smoker, now e-cigarettes   Clinical Intake:  Pre-visit preparation completed: Yes  Pain : No/denies pain      Nutritional Risks: None Diabetes: No  How often do you need to have someone help you when you read instructions, pamphlets, or other written materials from your doctor or pharmacy?: 1 - Never What is the last grade level you completed in school?: [place] Needed?: No  Information entered by :: CJohnson, LPN  Past Medical History:  Diagnosis Date  . Adrenal hyperplasia ([place]) 12/2011   on CT  . Allergy   . CAP (community acquired pneumonia) 12/2011  . Chest pain    normal ETT 10/2011  . 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 Ex-smoker 08/2014   currently using e cig  . Microscopic colitis 06/2015   lymphocytic by colonoscopy - started entocort [name]  . Osteoporosis, unspecified   . Scoliosis of lumbar spine   . 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 COLONOSCOPY  06/2015   microscopic colitis [name]  . DEXA  10/2008   Osteoporosis  . DEXA  02/2014   T score osteopenia hip, osteoporosis spine, scoliosis  . ETT  10/2011   WNL, no evidence of ischemia, excellent exercise tolerance  . EXPLORATORY LAPAROTOMY    . MRI head  07/2011   multiple foci deep and subcortical white matter, chronic ischemic vs demyelinating, no active disease  . TONSILLECTOMY AND ADENOIDECTOMY    .  Vein removal     removed from legs   Family History  Problem Relation Age of Onset  . Osteoporosis Other        great grandmother  . Stroke Maternal Grandmother   . Coronary artery disease Maternal Grandfather 50  . Diabetes Maternal Grandfather   . Coronary artery disease Paternal Grandmother 80  . Coronary artery disease Paternal Grandfather 54  . Lung cancer Maternal Aunt    Social History   Socioeconomic History  . Marital status: Married    Spouse name: Not on file  . Number of children: 3  . Years of education: Not on file  . Highest education level: Not on  file  Occupational History  . Occupation: retired  Tobacco Use  . Smoking status: Former Smoker    Packs/day: 1.00    Years: 30.00    Pack years: 30.00    Types: Cigarettes    Start date: 10/14/1978    Quit date: 07/15/2015    Years since quitting: 4.4  . Smokeless tobacco: Never Used  . Tobacco comment: off and on smoker, now e-cigarettes  Substance and Sexual Activity  . Alcohol use: Yes    Alcohol/week: 0.0 standard drinks    Comment: social wine once a week  . Drug use: No  . Sexual activity: Not on file  Other Topics Concern  . Not on file  Social History Narrative   Caffeine: 1 cup coffee/day   Lives with husband, outside dog   Smoking vapes.   Pt tans regularly - at her pool and in her garden. Wears sunscreen   Social Determinants of Health   Financial Resource Strain: Low Risk   . Difficulty of Paying Living Expenses: Not hard at all  Food Insecurity: No Food Insecurity  . Worried About Charity fundraiser in the Last Year: Never true  . Ran Out of Food in the Last Year: Never true  Transportation Needs: No Transportation Needs  . Lack of Transportation (Medical): No  . Lack of Transportation (Non-Medical): No  Physical Activity: Sufficiently Active  . Days of Exercise per Week: 7 days  . Minutes of Exercise per Session: 60 min  Stress: No Stress Concern Present  . Feeling of Stress : Not at all  Social Connections:   . Frequency of Communication with Friends and Family:   . Frequency of Social Gatherings with Friends and Family:   . Attends Religious Services:   . Active Member of Clubs or Organizations:   . Attends Archivist Meetings:   Marland Kitchen Marital Status:     Outpatient Encounter Medications as of 12/23/2019  Medication Sig  . aspirin EC 81 MG tablet Take 81 mg by mouth daily.    . B Complex-Biotin-FA (B-COMPLEX PO) Take 1 tablet by mouth daily.  . Calcium Carbonate-Vitamin D (CALCIUM 600+D) 600-200 MG-UNIT TABS Take 1 tablet by mouth 2 (two)  times daily.    . Multiple Vitamin (MULTIVITAMIN) tablet Take 1 tablet by mouth daily.    Marland Kitchen OVER THE COUNTER MEDICATION Take 1 capsule by mouth daily. SEROVITAL [place]  . VENTOLIN HFA 108 (90 Base) MCG/ACT inhaler TAKE 2 PUFFS BY MOUTH EVERY 6 HOURS AS NEEDED FOR WHEEZE OR SHORTNESS OF BREATH  . vitamin B-12 (CYANOCOBALAMIN) 100 MCG tablet Take 100 mcg by mouth daily.    . risedronate (ACTONEL) 5 MG tablet Take 5 mg by mouth daily before breakfast. with water on empty stomach, nothing by mouth or lie down for next  30 minutes.   No facility-administered encounter medications on file as of 12/23/2019.    Activities of Daily Living In your present state of health, do you have any difficulty performing the following activities: 12/23/2019  Hearing? N  Vision? N  Difficulty concentrating or making decisions? N  Walking or climbing stairs? N  Dressing or bathing? N  Doing errands, shopping? N  Preparing Food and eating ? N  Using the Toilet? N  In the past six months, have you accidently leaked urine? N  Do you have problems with loss of bowel control? N  Managing your Medications? N  Managing your Finances? N  Housekeeping or managing your Housekeeping? N  Some recent data might be hidden    Patient Care Team: Ria Bush, MD as PCP - General (Family Medicine)    Assessment:   This is a routine wellness examination for [place].  Exercise Activities and Dietary recommendations Current Exercise Habits: Home exercise routine, Type of exercise: walking, Time (Minutes): 60, Frequency (Times/Week): 7, Weekly Exercise (Minutes/Week): 420, Intensity: Moderate, Exercise limited by: None identified  Goals    . Increase physical activity     Starting 12/22/2018, I will continue to walk at least 35 minutes daily.     . Patient Stated     12/23/2019, I will continue to walk everyday for 1 hour.        Fall Risk Fall Risk  12/23/2019 12/22/2018 06/11/2017  Falls in the past year? 1 0 No    Comment fell down the steps - -  Number falls in past yr: 0 - -  Injury with Fall? 0 - -  Risk for fall due to : No Fall Risks - -  Follow up Falls evaluation completed;Falls prevention discussed - -   Is the patient's home free of loose throw rugs in walkways, pet beds, electrical cords, etc?   yes      Grab bars in the bathroom? no      Handrails on the stairs?   yes      Adequate lighting?   yes  Timed Get Up and Go performed: N/A  Depression Screen PHQ 2/9 Scores 12/23/2019 12/22/2018 06/11/2017  PHQ - 2 Score 0 0 0  PHQ- 9 Score 0 0 0     Cognitive Function MMSE - Mini Mental State Exam 12/23/2019 12/22/2018 06/11/2017  Orientation to time 5 5 5   Orientation to Place 5 5 5   Registration 3 3 3   Attention/ Calculation 5 0 0  Recall 3 0 3  Recall-comments - unable to recall 3 of 3 words -  Language- name 2 objects - 0 0  Language- repeat 1 1 1   Language- follow 3 step command - 3 3  Language- read & follow direction - 0 0  Write a sentence - 0 0  Copy design - 0 0  Total score - 17 20  Mini Cog  Mini-Cog screen was completed. Maximum score is 22. A value of 0 denotes this part of the MMSE was not completed or the patient failed this part of the Mini-Cog screening.       Immunization History  Administered Date(s) Administered  . Influenza Whole 07/19/2008, 08/24/2012  . Influenza, High Dose Seasonal PF 08/11/2018, 06/04/2019  . Influenza, Seasonal, Injecte, Preservative Fre 07/13/2013  . Influenza,inj,Quad PF,6+ Mos 08/22/2015, 06/26/2017  . Influenza-Unspecified 08/16/2016  . PFIZER SARS-COV-2 Vaccination 12/13/2019  . Pneumococcal Conjugate-13 06/11/2017  . Pneumococcal Polysaccharide-23 01/01/2012, 02/26/2019  . Td 04/04/2004  .  Tdap 02/01/2016  . Zoster 10/13/2013    Qualifies for Shingles Vaccine: Yes  Screening Tests Health Maintenance  Topic Date Due  . DTAP VACCINES (1) 04/18/1951  . MAMMOGRAM  12/22/2020 (Originally 06/26/2019)  . COLONOSCOPY   06/26/2025  . DTaP/Tdap/Td (3 - Td) 01/31/2026  . TETANUS/TDAP  01/31/2026  . INFLUENZA VACCINE  Completed  . DEXA SCAN  Completed  . Hepatitis C Screening  Completed  . PNA vac Low Risk Adult  Completed    Cancer Screenings: Lung: Low Dose CT Chest recommended if Age 29-80 years, 30 pack-year currently smoking OR have quit w/in 15 years. Patient does not qualify. Breast:  Up to date on Mammogram: No, Patient declined due to COVID   Bone Density/Dexa: completed 06/25/2017 Colorectal: completed 06/27/2015  Additional Screenings:  Hepatitis C Screening: 01/25/2016     Plan:    Patient will continue to walk everyday for 1 hour.    I have personally reviewed and noted the following in the patient's chart:   . Medical and social history . Use of alcohol, tobacco or illicit drugs  . Current medications and supplements . Functional ability and status . Nutritional status . Physical activity . Advanced directives . List of other physicians . Hospitalizations, surgeries, and ER visits in previous 12 months . Vitals . Screenings to include cognitive, depression, and falls . Referrals and appointments  In addition, I have reviewed and discussed with patient certain preventive protocols, quality metrics, and best practice recommendations. A written personalized care plan for preventive services as well as general preventive health recommendations were provided to patient.     Andrez Grime, LPN  04/21/6437

## 2019-12-23 NOTE — Patient Instructions (Signed)
Lindsay Mason , Thank you for taking time to come for your Medicare Wellness Visit. I appreciate your ongoing commitment to your health goals. Please review the following plan we discussed and let me know if I can assist you in the future.   Screening recommendations/referrals: Colonoscopy: Up to date, completed 06/27/2015 Mammogram: declined due to COVID Bone Density: completed 06/25/2017 Recommended yearly ophthalmology/optometry visit for glaucoma screening and checkup Recommended yearly dental visit for hygiene and checkup  Vaccinations: Influenza vaccine: Up to date, completed 06/04/2019 Pneumococcal vaccine: Completed series Tdap vaccine: Up to date, completed 02/01/2016 Shingles vaccine: discussed    Advanced directives: Advance directive discussed with you today. Even though you declined this today please call our office should you change your mind and we can give you the proper paperwork for you to fill out.  Conditions/risks identified: none  Next appointment: 12/29/2019 @ 11:30 am    Preventive Care 65 Years and Older, Female Preventive care refers to lifestyle choices and visits with your health care provider that can promote health and wellness. What does preventive care include?  A yearly physical exam. This is also called an annual well check.  Dental exams once or twice a year.  Routine eye exams. Ask your health care provider how often you should have your eyes checked.  Personal lifestyle choices, including:  Daily care of your teeth and gums.  Regular physical activity.  Eating a healthy diet.  Avoiding tobacco and drug use.  Limiting alcohol use.  Practicing safe sex.  Taking low-dose aspirin every day.  Taking vitamin and mineral supplements as recommended by your health care provider. What happens during an annual well check? The services and screenings done by your health care provider during your annual well check will depend on your age, overall  health, lifestyle risk factors, and family history of disease. Counseling  Your health care provider may ask you questions about your:  Alcohol use.  Tobacco use.  Drug use.  Emotional well-being.  Home and relationship well-being.  Sexual activity.  Eating habits.  History of falls.  Memory and ability to understand (cognition).  Work and work Statistician.  Reproductive health. Screening  You may have the following tests or measurements:  Height, weight, and BMI.  Blood pressure.  Lipid and cholesterol levels. These may be checked every 5 years, or more frequently if you are over 73 years old.  Skin check.  Lung cancer screening. You may have this screening every year starting at age 94 if you have a 30-pack-year history of smoking and currently smoke or have quit within the past 15 years.  Fecal occult blood test (FOBT) of the stool. You may have this test every year starting at age 48.  Flexible sigmoidoscopy or colonoscopy. You may have a sigmoidoscopy every 5 years or a colonoscopy every 10 years starting at age 43.  Hepatitis C blood test.  Hepatitis B blood test.  Sexually transmitted disease (STD) testing.  Diabetes screening. This is done by checking your blood sugar (glucose) after you have not eaten for a while (fasting). You may have this done every 1-3 years.  Bone density scan. This is done to screen for osteoporosis. You may have this done starting at age 59.  Mammogram. This may be done every 1-2 years. Talk to your health care provider about how often you should have regular mammograms. Talk with your health care provider about your test results, treatment options, and if necessary, the need for more tests. Vaccines  Your health care provider may recommend certain vaccines, such as:  Influenza vaccine. This is recommended every year.  Tetanus, diphtheria, and acellular pertussis (Tdap, Td) vaccine. You may need a Td booster every 10  years.  Zoster vaccine. You may need this after age 39.  Pneumococcal 13-valent conjugate (PCV13) vaccine. One dose is recommended after age 57.  Pneumococcal polysaccharide (PPSV23) vaccine. One dose is recommended after age 56. Talk to your health care provider about which screenings and vaccines you need and how often you need them. This information is not intended to replace advice given to you by your health care provider. Make sure you discuss any questions you have with your health care provider. Document Released: 10/27/2015 Document Revised: 06/19/2016 Document Reviewed: 08/01/2015 Elsevier Interactive Patient Education  2017 [place] Prevention in the Home Falls can cause injuries. They can happen to people of all ages. There are many things you can do to make your home safe and to help prevent falls. What can I do on the outside of my home?  Regularly fix the edges of walkways and driveways and fix any cracks.  Remove anything that might make you trip as you walk through a door, such as a raised step or threshold.  Trim any bushes or trees on the path to your home.  Use bright outdoor lighting.  Clear any walking paths of anything that might make someone trip, such as rocks or tools.  Regularly check to see if handrails are loose or broken. Make sure that both sides of any steps have handrails.  Any raised decks and porches should have guardrails on the edges.  Have any leaves, snow, or ice cleared regularly.  Use sand or salt on walking paths during winter.  Clean up any spills in your garage right away. This includes oil or grease spills. What can I do in the bathroom?  Use night lights.  Install grab bars by the toilet and in the tub and shower. Do not use towel bars as grab bars.  Use non-skid mats or decals in the tub or shower.  If you need to sit down in the shower, use a plastic, non-slip stool.  Keep the floor dry. Clean up any water that  spills on the floor as soon as it happens.  Remove soap buildup in the tub or shower regularly.  Attach bath mats securely with double-sided non-slip rug tape.  Do not have throw rugs and other things on the floor that can make you trip. What can I do in the bedroom?  Use night lights.  Make sure that you have a light by your bed that is easy to reach.  Do not use any sheets or blankets that are too big for your bed. They should not hang down onto the floor.  Have a firm chair that has side arms. You can use this for support while you get dressed.  Do not have throw rugs and other things on the floor that can make you trip. What can I do in the kitchen?  Clean up any spills right away.  Avoid walking on wet floors.  Keep items that you use a lot in easy-to-reach places.  If you need to reach something above you, use a strong step stool that has a grab bar.  Keep electrical cords out of the way.  Do not use floor polish or wax that makes floors slippery. If you must use wax, use non-skid floor wax.  Do  not have throw rugs and other things on the floor that can make you trip. What can I do with my stairs?  Do not leave any items on the stairs.  Make sure that there are handrails on both sides of the stairs and use them. Fix handrails that are broken or loose. Make sure that handrails are as long as the stairways.  Check any carpeting to make sure that it is firmly attached to the stairs. Fix any carpet that is loose or worn.  Avoid having throw rugs at the top or bottom of the stairs. If you do have throw rugs, attach them to the floor with carpet tape.  Make sure that you have a light switch at the top of the stairs and the bottom of the stairs. If you do not have them, ask someone to add them for you. What else can I do to help prevent falls?  Wear shoes that:  Do not have high heels.  Have rubber bottoms.  Are comfortable and fit you well.  Are closed at the  toe. Do not wear sandals.  If you use a stepladder:  Make sure that it is fully opened. Do not climb a closed stepladder.  Make sure that both sides of the stepladder are locked into place.  Ask someone to hold it for you, if possible.  Clearly mark and make sure that you can see:  Any grab bars or handrails.  First and last steps.  Where the edge of each step is.  Use tools that help you move around (mobility aids) if they are needed. These include:  Canes.  Walkers.  Scooters.  Crutches.  Turn on the lights when you go into a dark area. Replace any light bulbs as soon as they burn out.  Set up your furniture so you have a clear path. Avoid moving your furniture around.  If any of your floors are uneven, fix them.  If there are any pets around you, be aware of where they are.  Review your medicines with your doctor. Some medicines can make you feel dizzy. This can increase your chance of falling. Ask your doctor what other things that you can do to help prevent falls. This information is not intended to replace advice given to you by your health care provider. Make sure you discuss any questions you have with your health care provider. Document Released: 07/27/2009 Document Revised: 03/07/2016 Document Reviewed: 11/04/2014 Elsevier Interactive Patient Education  2017 [employer].

## 2019-12-23 NOTE — Progress Notes (Signed)
I have called and spoken with [name] regarding the results of her low-dose CT.  I explained that her scan was read as a lung RADS 4B, indicates suspicious findings for which additional diagnostic testing and or tissue sampling is recommended.  I explained that I am consulting with 2 of the physicians who work with me in the lung cancer screening program.  I explained that I will go ahead and place an order for pulmonary function test now.  She will get a call to schedule those most likely today or tomorrow. Additionally I told her that Dr. Valeta Harms and Dr. Carlis Abbott are going to review her scan to determine if a PET scan should be obtained, or if they recommend referral to interventional radiology for possible needle biopsy. She verbalized understanding of the above and had no further questions.  I told her that I would call once I hear back from Dr. Valeta Harms and Dr. Carlis Abbott regarding PET scan versus referral to IR. She does have my contact information in the event she has any further questions in the future. Primary care physician has been messaged regarding results of the scan and plan for follow-up.

## 2019-12-24 ENCOUNTER — Telehealth: Payer: Self-pay | Admitting: *Deleted

## 2019-12-24 ENCOUNTER — Other Ambulatory Visit: Payer: Self-pay | Admitting: Acute Care

## 2019-12-24 DIAGNOSIS — R911 Solitary pulmonary nodule: Secondary | ICD-10-CM

## 2019-12-24 NOTE — Telephone Encounter (Signed)
Has the patient been called? Let me know if I need to call her. Thanks

## 2019-12-24 NOTE — Telephone Encounter (Signed)
-----   Message from [name], DO sent at 12/23/2019  7:16 PM EST ----- Regarding: RE: lung biopsy- IR vs navigation   I would VATs directly  We still need to get a PET scan.  PFTS ASAP Pending those I would send to [facility].   I will show him the images.  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 Based on size and lack of adenopathy, she wouldn't need EBUS for staging since it is so peripheral. Sarah's question was PET vs IR referral? I think it is low chance of being PET avid since it is likely adenocarcinoma and <1cm. If you disagree let me know. Is this too peripheral and small to reliably get to with navigation? I would think IR's yield may be higher since it is truly subpleural. Let me know what your thoughts are.  Arby Barrette

## 2019-12-24 NOTE — Telephone Encounter (Signed)
PET order placed, routing to [facility] to follow up on

## 2019-12-24 NOTE — Telephone Encounter (Signed)
Spoke with patient She is scheduled for in office PFT on 01/03/20 at 3pm and she is going for her covid swab on 12/31/19 at 11am.   Dr. Valeta Harms please let me know if I need to put in order for PET scan or if this has already been placed.

## 2019-12-24 NOTE — Telephone Encounter (Signed)
Spoke with pt's husband [name]. He was aware of pt's covid test appt on 3/19 and the PFT at our office on 3/22. They had not been made aware yet of the PET appt. I provided Don with the info about pt's PET appt. Nothing further needed.

## 2019-12-24 NOTE — Telephone Encounter (Signed)
Images reviewed with Dr. Kipp Brood this morning.  Once PFTs complete and PET complete can see Dr. Kipp Brood in the office to discuss VATs. She will need NAV prior for dye marking.  Please place PET order.   [facility] 12/24/2019 10:01 AM

## 2019-12-24 NOTE — Telephone Encounter (Signed)
Order placed as emergency.  Gave to [place] to obtain precert before scheduling.

## 2019-12-29 ENCOUNTER — Encounter: Payer: Medicare HMO | Admitting: Family Medicine

## 2019-12-29 DIAGNOSIS — Z0289 Encounter for other administrative examinations: Secondary | ICD-10-CM

## 2019-12-30 ENCOUNTER — Ambulatory Visit: Payer: Medicare HMO

## 2019-12-30 ENCOUNTER — Telehealth: Payer: Self-pay | Admitting: Family Medicine

## 2019-12-30 NOTE — Telephone Encounter (Signed)
Patient missed physical appointment yesterday Chart reviewed.  I have tried several times calling patient to touch base about recent lung cancer screening CT results - and planned PET scan next week. Unable to reach her or husband.

## 2019-12-31 ENCOUNTER — Other Ambulatory Visit (HOSPITAL_COMMUNITY)
Admission: RE | Admit: 2019-12-31 | Discharge: 2019-12-31 | Disposition: A | Discharge status: Home / Self Care | Payer: Medicare HMO | Source: Ambulatory Visit | Attending: Pulmonary Disease | Admitting: Pulmonary Disease

## 2019-12-31 DIAGNOSIS — Z20822 Contact with and (suspected) exposure to covid-19: Secondary | ICD-10-CM | POA: Insufficient documentation

## 2019-12-31 DIAGNOSIS — Z01812 Encounter for preprocedural laboratory examination: Secondary | ICD-10-CM | POA: Diagnosis present

## 2019-12-31 LAB — SARS CORONAVIRUS 2 (TAT 6-24 HRS): SARS Coronavirus 2: NEGATIVE

## 2020-01-03 ENCOUNTER — Other Ambulatory Visit: Payer: Self-pay

## 2020-01-03 ENCOUNTER — Ambulatory Visit (INDEPENDENT_AMBULATORY_CARE_PROVIDER_SITE_OTHER): Payer: Medicare HMO | Admitting: Pulmonary Disease

## 2020-01-03 ENCOUNTER — Ambulatory Visit (HOSPITAL_COMMUNITY)
Admission: RE | Admit: 2020-01-03 | Discharge: 2020-01-03 | Disposition: A | Discharge status: Home / Self Care | Payer: Medicare HMO | Source: Ambulatory Visit | Attending: Pulmonary Disease | Admitting: Pulmonary Disease

## 2020-01-03 DIAGNOSIS — J984 Other disorders of lung: Secondary | ICD-10-CM | POA: Insufficient documentation

## 2020-01-03 DIAGNOSIS — N83201 Unspecified ovarian cyst, right side: Secondary | ICD-10-CM | POA: Insufficient documentation

## 2020-01-03 DIAGNOSIS — R911 Solitary pulmonary nodule: Secondary | ICD-10-CM | POA: Insufficient documentation

## 2020-01-03 DIAGNOSIS — I700 Atherosclerosis of aorta: Secondary | ICD-10-CM | POA: Insufficient documentation

## 2020-01-03 DIAGNOSIS — J439 Emphysema, unspecified: Secondary | ICD-10-CM | POA: Diagnosis not present

## 2020-01-03 LAB — PULMONARY FUNCTION TEST
DL/VA % pred: 92 %
DL/VA: 3.91 ml/min/mmHg/L
DLCO cor % pred: 97 %
DLCO cor: 17.21 ml/min/mmHg
DLCO unc % pred: 100 %
DLCO unc: 17.67 ml/min/mmHg
FEF 25-75 Post: 1.31 L/sec
FEF 25-75 Pre: 0.94 L/sec
FEF2575-%Change-Post: 39 %
FEF2575-%Pred-Post: 71 %
FEF2575-%Pred-Pre: 51 %
FEV1-%Change-Post: 12 %
FEV1-%Pred-Post: 93 %
FEV1-%Pred-Pre: 83 %
FEV1-Post: 1.91 L
FEV1-Pre: 1.71 L
FEV1FVC-%Change-Post: 6 %
FEV1FVC-%Pred-Pre: 85 %
FEV6-%Change-Post: 7 %
FEV6-%Pred-Post: 104 %
FEV6-%Pred-Pre: 98 %
FEV6-Post: 2.71 L
FEV6-Pre: 2.54 L
FEV6FVC-%Change-Post: 1 %
FEV6FVC-%Pred-Post: 102 %
FEV6FVC-%Pred-Pre: 101 %
FVC-%Change-Post: 5 %
FVC-%Pred-Post: 101 %
FVC-%Pred-Pre: 96 %
FVC-Post: 2.76 L
FVC-Pre: 2.62 L
Post FEV1/FVC ratio: 69 %
Post FEV6/FVC ratio: 98 %
Pre FEV1/FVC ratio: 65 %
Pre FEV6/FVC Ratio: 97 %
RV % pred: 144 %
RV: 2.88 L
TLC % pred: 118 %
TLC: 5.46 L

## 2020-01-03 LAB — GLUCOSE, CAPILLARY: Glucose-Capillary: 100 mg/dL — ABNORMAL HIGH (ref 70–99)

## 2020-01-03 MED ORDER — FLUDEOXYGLUCOSE F - 18 (FDG) INJECTION
6.7000 | Freq: Once | INTRAVENOUS | Status: AC | PRN
  Administered 2020-01-03: 6.7 via INTRAVENOUS

## 2020-01-03 NOTE — Progress Notes (Signed)
PFT done today. 

## 2020-01-05 ENCOUNTER — Telehealth: Payer: Self-pay | Admitting: Pulmonary Disease

## 2020-01-05 NOTE — Telephone Encounter (Signed)
Dr Valeta Harms, Eric Form wanted me to make sure you will review pt's PFT and PET results. Thank you.

## 2020-01-05 NOTE — Telephone Encounter (Signed)
Thanks, I did. I communicated with Dr. Kipp Brood already. I believe he is seeing her tomorrow or Friday  Thanks [facility] 01/05/2020 9:03 AM

## 2020-01-06 NOTE — Progress Notes (Signed)
[address]       [place] [address]             [phone]                    Lindsay Mason [place] Medical Record #[id number] Date of Birth: 1951/05/16  Referring: Lindsay Bush, MD Primary Care: Lindsay Bush, MD Primary Cardiologist: No primary care provider on file.  Chief Complaint:   No chief complaint on file.   History of Present Illness:    Lindsay Mason 69 y.o. female referred for surgical evaluation of a 1.8 cm right lower lobe pulmonary nodule with an SUV of 2.5.  This was originally evaluated on cross-sectional imaging over the course of subsequent screenings there is a new growth with solid component of this lesion.  She denies any symptoms from a respiratory standpoint.  She remains active and walks, and today without difficulty.  She would like to be aggressive proceed with diagnosis and treatment.    Smoking Hx: She quit smoking in 2008.  When she did smoke she never smoked more than 5 cigarettes a day   Zubrod Score: At the time of surgery this patient's most appropriate activity status/level should be described as: [x]     0    Normal activity, no symptoms []     1    Restricted in physical strenuous activity but ambulatory, able to do out light work []     2    Ambulatory and capable of self care, unable to do work activities, up and about               >50 % of waking hours                              []     3    Only limited self care, in bed greater than 50% of waking hours []     4    Completely disabled, no self care, confined to bed or chair []     5    Moribund   Past Medical History:  Diagnosis Date  . Adrenal hyperplasia ([place]) 12/2011   on CT  . Allergy   . CAP (community acquired pneumonia) 12/2011  . Chest pain    normal ETT 10/2011  . 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 Ex-smoker 08/2014   currently using e cig  . Microscopic colitis 06/2015   lymphocytic by  colonoscopy - started entocort [name]  . Osteoporosis, unspecified   . Scoliosis of lumbar spine   . Thoracic aortic atherosclerosis ([place]) 02/2016   by CT    Past Surgical History:  Procedure Laterality Date  . COLONOSCOPY  06/2015   microscopic colitis [name]  . DEXA  10/2008   Osteoporosis  . DEXA  02/2014   T score osteopenia hip, osteoporosis spine, scoliosis  . ETT  10/2011   WNL, no evidence of ischemia, excellent exercise tolerance  . EXPLORATORY LAPAROTOMY    . MRI head  07/2011   multiple foci deep and subcortical white matter, chronic ischemic vs demyelinating, no active disease  . TONSILLECTOMY AND ADENOIDECTOMY    . Vein removal     removed from legs    Family History  Problem Relation Age of Onset  . Osteoporosis Other        great  grandmother  . Stroke Maternal Grandmother   . Coronary artery disease Maternal Grandfather 50  . Diabetes Maternal Grandfather   . Coronary artery disease Paternal Grandmother 80  . Coronary artery disease Paternal Grandfather 37  . Lung cancer Maternal Aunt      Social History   Tobacco Use  Smoking Status Former Smoker  . Packs/day: 1.00  . Years: 30.00  . Pack years: 30.00  . Types: Cigarettes  . Start date: 10/14/1978  . Quit date: 07/15/2015  . Years since quitting: 4.4  Smokeless Tobacco Never Used  Tobacco Comment   off and on smoker, now e-cigarettes    Social History   Substance and Sexual Activity  Alcohol Use Yes  . Alcohol/week: 0.0 standard drinks   Comment: social wine once a week     Allergies  Allergen Reactions  . Penicillins     REACTION: swelling tongue - States has taken Cephalosporins without difficulty in past    Current Outpatient Medications  Medication Sig Dispense Refill  . aspirin EC 81 MG tablet Take 81 mg by mouth daily.      . B Complex-Biotin-FA (B-COMPLEX PO) Take 1 tablet by mouth daily.    . Calcium Carbonate-Vitamin D (CALCIUM 600+D) 600-200 MG-UNIT TABS Take 1 tablet by  mouth 2 (two) times daily.      . Multiple Vitamin (MULTIVITAMIN) tablet Take 1 tablet by mouth daily.      Marland Kitchen OVER THE COUNTER MEDICATION Take 1 capsule by mouth daily. SEROVITAL [place]    . risedronate (ACTONEL) 5 MG tablet Take 5 mg by mouth daily before breakfast. with water on empty stomach, nothing by mouth or lie down for next 30 minutes.    . VENTOLIN HFA 108 (90 Base) MCG/ACT inhaler TAKE 2 PUFFS BY MOUTH EVERY 6 HOURS AS NEEDED FOR WHEEZE OR SHORTNESS OF BREATH 18 g 3  . vitamin B-12 (CYANOCOBALAMIN) 100 MCG tablet Take 100 mcg by mouth daily.       No current facility-administered medications for this visit.    Review of Systems  Constitutional: Negative.   Respiratory: Negative.   Cardiovascular: Negative.   Musculoskeletal: Negative.   Neurological: Negative.      PHYSICAL EXAMINATION: There were no vitals taken for this visit. Physical Exam  Constitutional: She is oriented to person, place, and time. She appears well-developed and well-nourished. No distress.  HENT:  Head: Normocephalic and atraumatic.  Eyes: Conjunctivae and EOM are normal.  Neck: No tracheal deviation present.  Cardiovascular: Normal rate, regular rhythm and normal heart sounds.  No murmur heard. Respiratory: Effort normal and breath sounds normal. No respiratory distress.  GI: She exhibits no distension.  Musculoskeletal:        General: Normal range of motion.     Cervical back: Normal range of motion.  Neurological: She is alert and oriented to person, place, and time.  Skin: Skin is warm and dry. She is not diaphoretic.    Diagnostic Studies & Laboratory data:     Recent Radiology Findings:   NM PET Image Initial (PI) Skull Base To Thigh  Result Date: 01/03/2020 CLINICAL DATA:  Initial treatment strategy for enlarging right lower lobe lung nodule. EXAM: NUCLEAR MEDICINE PET SKULL BASE TO THIGH TECHNIQUE: 6.7 mCi F-18 FDG was injected intravenously. Full-ring PET imaging was performed from  the skull base to thigh after the radiotracer. CT data was obtained and used for attenuation correction and anatomic localization. Fasting blood glucose: 100 mg/dl COMPARISON:  Chest CT  12/16/2019 FINDINGS: Mediastinal blood pool activity: SUV max 1.74 Liver activity: SUV max NA NECK: No hypermetabolic lymph nodes in the neck. Incidental CT findings: none CHEST: Semi-solid nodular lesion in the superior segment of the right lower lobe with a small associated solid nodular component is again demonstrated. This is mildly hypermetabolic with SUV max of 9.62. This is certainly suspicious for a low-grade adenocarcinoma. No other pulmonary lesions are identified. No enlarged or hypermetabolic mediastinal or hilar lymph nodes. No breast masses, supraclavicular or axillary adenopathy. Incidental CT findings: Underlying emphysematous changes and pulmonary scarring again demonstrated. No acute pulmonary findings. ABDOMEN/PELVIS: No abnormal hypermetabolic activity within the liver, pancreas, adrenal glands, or spleen. No hypermetabolic lymph nodes in the abdomen or pelvis. Incidental CT findings: Moderate tortuosity and age advanced atherosclerotic calcifications involving the abdominal aorta and iliac arteries. No aneurysm. Multiple small cyst associated with the right ovary. Hydrosalpinx would be another possibility. SKELETON: No hypermetabolic bone lesions to suggest metastatic disease. Incidental CT findings: Healed left lower rib fractures are noted. Significant scoliosis and degenerative lumbar spondylosis with multilevel disc disease and facet disease. Remote healed left-sided pubic rami fractures. Diffuse FDG uptake is noted in the left gluteal, left adductor and left piriformis muscles likely related to recent trauma with muscle injuries. IMPRESSION: 1. Superior segment right lower lobe lesion is mildly hypermetabolic and most consistent with a low-grade adenocarcinoma. 2. No findings for mediastinal or hilar disease  or metastatic disease elsewhere. 3. Stable underlying emphysematous changes and pulmonary scarring. 4. Moderate age advanced atherosclerotic calcifications. 5. Diffuse FDG uptake in the left pelvic muscles, likely related to recent trauma with muscle injuries. 6. Cystic structures associated with the right ovary likely benign cysts or hydrosalpinx. Electronically Signed   By: Marijo Sanes M.D.   On: 01/03/2020 14:04   CT CHEST LCS NODULE F/U W/O CONTRAST  Result Date: 12/20/2019 CLINICAL DATA:  70 female former smoker, quit 5 years ago, with 30 pack-year history of smoking, for short-term follow-up lung cancer screening EXAM: CT CHEST WITHOUT CONTRAST FOR LUNG CANCER SCREENING NODULE FOLLOW-UP TECHNIQUE: Multidetector CT imaging of the chest was performed following the standard protocol without IV contrast. COMPARISON:  Low-dose lung cancer screening CT chest dated 05/26/2019 FINDINGS: Cardiovascular: The heart is normal in size. No pericardial effusion. No evidence of thoracic aortic aneurysm. Mild atherosclerotic calcifications of the aortic arch. Mediastinum/Nodes: No suspicious mediastinal lymphadenopathy. Visualized thyroid is unremarkable. Lungs/Pleura: Biapical pleural-parenchymal scarring. Mild centrilobular and paraseptal emphysematous changes, upper lung predominant. No focal consolidation. Scattered small subpleural bilateral pulmonary nodules, measuring up to 3.5 mm in the superior segment right lower lobe, unchanged, benign. Progressive subsolid posterior right lower lobe nodule, measuring 18.3 mm overall (unchanged), but now with a 9.4 mm central solid component (previously 4.2 mm), highly suspicious for invasive adenocarcinoma. No pleural effusion or pneumothorax. Upper Abdomen: Visualized upper abdomen is grossly unremarkable. Musculoskeletal: Degenerative changes of the upper lumbar spine. IMPRESSION: Lung-RADS 4B, suspicious. Additional imaging evaluation or consultation with Pulmonology  or Thoracic Surgery recommended. Progressive subsolid posterior right lower lobe nodule, now with 9 mm central solid component, highly suspicious for invasive adenocarcinoma. Discussion at multidisciplinary tumor board is suggested. Consider PET-CT and/or tissue sampling, as clinically warranted. Aortic Atherosclerosis (ICD10-I70.0) and Emphysema (ICD10-J43.9). Electronically Signed   By: Julian Hy M.D.   On: 12/20/2019 08:47       I have independently reviewed the above radiology studies  and reviewed the findings with the patient.   Recent Lab Findings: Lab Results  Component  Value Date   WBC 4.7 12/23/2019   HGB 14.3 12/23/2019   HCT 43.1 12/23/2019   PLT 226.0 12/23/2019   GLUCOSE 99 12/23/2019   CHOL 197 12/22/2018   TRIG 57.0 12/22/2018   HDL 98.70 12/22/2018   LDLCALC 87 12/22/2018   ALT 12 12/23/2019   AST 18 12/23/2019   NA 140 12/23/2019   K 5.0 12/23/2019   CL 105 12/23/2019   CREATININE 0.84 12/23/2019   BUN 23 12/23/2019   CO2 29 12/23/2019   TSH 2.25 12/22/2018     PFTs: - FVC: 96% - FEV1: 83% -DLCO: 97%  Problem List: 1.8 cm right lower lobe nodule.  Concerning for primary lung cancer   Assessment / Plan:   69 year old female with a 1.8 cm right lower lobe nodule concerning for primary lung cancer.  It has only mildly avid on PET, but given her history of smoking I do think that a biopsy would be recommended.  I personally reviewed her CT scan and PET/CT.  We reviewed several options for biopsy which included a CT-guided biopsy versus navigational bronchoscopy versus surgical biopsy.  Based on her CT scan, she would be an ideal candidate for navigational bronchoscopic marking followed wedge resection as combined procedure.  I will discuss this with Dr. Valeta Harms.  The surgical biopsy is positive for primary lung cancer she will require lobectomy.  She will require cardiac work-up prior to the procedure.  She is tentatively scheduled for navigational  bronchoscopy marking with Dr. Valeta Harms, and a robotic assisted right thoracoscopy with right lower lobe wedge and possible lobectomy with me in the next 2 weeks.     I  spent 40 minutes with  the patient face to face and greater then 50% of the time was spent in counseling and coordination of care.    [name] 01/06/2020 11:54 AM

## 2020-01-07 ENCOUNTER — Institutional Professional Consult (permissible substitution): Payer: Medicare HMO | Admitting: Thoracic Surgery (Cardiothoracic Vascular Surgery)

## 2020-01-07 ENCOUNTER — Other Ambulatory Visit (HOSPITAL_COMMUNITY): Payer: Self-pay | Admitting: General Surgery

## 2020-01-07 ENCOUNTER — Other Ambulatory Visit: Payer: Self-pay | Admitting: Thoracic Surgery (Cardiothoracic Vascular Surgery)

## 2020-01-07 ENCOUNTER — Encounter: Payer: Self-pay | Admitting: Thoracic Surgery (Cardiothoracic Vascular Surgery)

## 2020-01-07 ENCOUNTER — Other Ambulatory Visit: Payer: Self-pay

## 2020-01-07 VITALS — BP 138/84 | HR 83 | Temp 98.1°F | Resp 20 | Ht 63.5 in | Wt 146.0 lb

## 2020-01-07 DIAGNOSIS — R911 Solitary pulmonary nodule: Secondary | ICD-10-CM | POA: Diagnosis not present

## 2020-01-09 ENCOUNTER — Encounter: Payer: Self-pay | Admitting: *Deleted

## 2020-01-09 ENCOUNTER — Other Ambulatory Visit: Payer: Self-pay | Admitting: *Deleted

## 2020-01-09 DIAGNOSIS — R911 Solitary pulmonary nodule: Secondary | ICD-10-CM

## 2020-01-10 ENCOUNTER — Telehealth: Payer: Self-pay | Admitting: Pulmonary Disease

## 2020-01-10 NOTE — Telephone Encounter (Signed)
I called Dr.Lightfoots office to ask for clarification and had to leave a message for [place]. Asked her to call me back or to contact [name] at [facility] scheduling center at [phone] to get clarification.

## 2020-01-11 ENCOUNTER — Ambulatory Visit: Payer: Medicare HMO | Attending: Internal Medicine

## 2020-01-11 DIAGNOSIS — Z23 Encounter for immunization: Secondary | ICD-10-CM

## 2020-01-11 NOTE — Telephone Encounter (Addendum)
Spoke with Lindsay Mason, she states we need to get prior authorization for the outpatient procedure. Dr. Kipp Brood has done the PA for their inpatient CPT code. [place] [facility] do you get the PA's on these procedures? It looks like pt is having a NAV according [facility].   The code we are using for our procedure is CPT code [phone]

## 2020-01-11 NOTE — Progress Notes (Signed)
   Covid-19 Vaccination Clinic  Name:  Lindsay Mason    MRN: [id number] DOB: 1951-02-05  01/11/2020  Ms. Wedin was observed post Covid-19 immunization for 15 minutes without incident. She was provided with Vaccine Information Sheet and instruction to access the V-Safe system.   Ms. Metter was instructed to call 911 with any severe reactions post vaccine: Marland Kitchen Difficulty breathing  . Swelling of face and throat  . A fast heartbeat  . A bad rash all over body  . Dizziness and weakness   Immunizations Administered    Name Date Dose VIS Date Route   Pfizer COVID-19 Vaccine 01/11/2020 11:55 AM 0.3 mL 09/24/2019 Intramuscular   Manufacturer: [place]   Lot: [id number]   [place]: [id number]

## 2020-01-12 NOTE — Telephone Encounter (Signed)
Spoke to [name] at preservice center gave her the [place]

## 2020-01-12 NOTE — Telephone Encounter (Signed)
I have called pt's insurance [employer] no precert is required on cpt code [phone] ref# for the call is [place]

## 2020-01-17 ENCOUNTER — Telehealth: Payer: Self-pay | Admitting: Pulmonary Disease

## 2020-01-17 ENCOUNTER — Telehealth: Payer: Self-pay | Admitting: *Deleted

## 2020-01-17 DIAGNOSIS — R911 Solitary pulmonary nodule: Secondary | ICD-10-CM

## 2020-01-17 NOTE — Telephone Encounter (Signed)
Spoke with Lindsay Mason, explained the Super D appt was tomorrow at [place] at 1:30. I gave him the address and phone number. He understood and nothing further is needed

## 2020-01-17 NOTE — Telephone Encounter (Signed)
Thank you [name], DO [facility] 01/17/2020 4:35 PM

## 2020-01-17 NOTE — Progress Notes (Signed)
[id number] [address], [place] [address] [place] [address] Phone: [phone] Fax: [phone]      Your procedure is scheduled on Thursday January 20, 2020.  Report to [facility] Main Entrance "A" at 0530 A.M., and check in at the Admitting office.  Call this number if you have problems the morning of surgery:  [phone]  Call [phone] if you have any questions prior to your surgery date Monday-Friday 8am-4pm    Remember:  Do not eat or drink after midnight the night before your surgery    Take these medicines as needed the morning of surgery   Polyethyl Glycol-Propyl Glycol (LUBRICANT EYE DROPS) 0.4-0.3 % SOLN   VENTOLIN HFA 108 (90 Base) MCG/ACT inhaler  As of today, STOP taking any Aspirin (unless otherwise instructed by your surgeon) and Aspirin containing products, Aleve, Naproxen, Ibuprofen, Motrin, Advil, Goody's, BC's, all herbal medications, fish oil, and all vitamins.                      Do not wear jewelry, make up, or nail polish            Do not wear lotions, powders, perfumes, or deodorant.            Do not shave 48 hours prior to surgery.              Do not bring valuables to the hospital.            [facility] is not responsible for any belongings or valuables.  Do NOT Smoke (Tobacco/Vapping) or drink Alcohol 24 hours prior to your procedure If you use a CPAP at night, you may bring all equipment for your overnight stay.   Contacts, glasses, dentures or bridgework may not be worn into surgery.      For patients admitted to the hospital, discharge time will be determined by your treatment team.   Patients discharged the day of surgery will not be allowed to drive home, and someone needs to stay with them for 24 hours.    Special instructions:   [facility]- Preparing For Surgery  Before surgery, you can play an important role. Because skin is not sterile, your skin needs to be as free of germs as possible.  You can reduce the number of germs on your skin by washing with CHG (chlorahexidine gluconate) Soap before surgery.  CHG is an antiseptic cleaner which kills germs and bonds with the skin to continue killing germs even after washing.    Oral Hygiene is also important to reduce your risk of infection.  Remember - BRUSH YOUR TEETH THE MORNING OF SURGERY WITH YOUR REGULAR TOOTHPASTE  Please do not use if you have an allergy to CHG or antibacterial soaps. If your skin becomes reddened/irritated stop using the CHG.  Do not shave (including legs and underarms) for at least 48 hours prior to first CHG shower. It is OK to shave your face.  Please follow these instructions carefully.   1. Shower the NIGHT BEFORE SURGERY and the MORNING OF SURGERY with CHG Soap.   2. If you chose to wash your hair, wash your hair first as usual with your normal shampoo.  3. After you shampoo, rinse your hair and body thoroughly to remove the shampoo.  4. Use CHG as you would any other liquid soap. You can apply CHG directly to the skin and wash gently with a scrungie or a clean washcloth.  5. Apply the CHG Soap to your body ONLY FROM THE NECK DOWN.  Do not use on open wounds or open sores. Avoid contact with your eyes, ears, mouth and genitals (private parts). Wash Face and genitals (private parts)  with your normal soap.   6. Wash thoroughly, paying special attention to the area where your surgery will be performed.  7. Thoroughly rinse your body with warm water from the neck down.  8. DO NOT shower/wash with your normal soap after using and rinsing off the CHG Soap.  9. Pat yourself dry with a CLEAN TOWEL.  10. Wear CLEAN PAJAMAS to bed the night before surgery, wear comfortable clothes the morning of surgery  11. Place CLEAN SHEETS on your bed the night of your first shower and DO NOT SLEEP WITH PETS.   Day of Surgery:   Do not apply any deodorants/lotions.  Please wear clean clothes to the  hospital/surgery center.   Remember to brush your teeth WITH YOUR REGULAR TOOTHPASTE.   Please read over the following fact sheets that you were given.

## 2020-01-17 NOTE — Telephone Encounter (Signed)
Call this number instead of the previous number gave the number for rehab [phone] pre-admid there til 4:30.Lindsay Mason

## 2020-01-17 NOTE — Telephone Encounter (Signed)
-----   Message from [name], DO sent at 01/15/2020  2:57 PM EDT ----- Regarding: SuperD - needs  Hello!  This lady will need a superD CT completed prior to her NAV. The nav is scheduled on Thursday morning this week and I don't see that she has had a recent CT. So I think we are going to have to order one.   Sorry this we an oversight by me. I was out this week and didn't realize she didn't have a recent ct. so we will need to get one stat for planning purposes.  Thanks  [employer]

## 2020-01-17 NOTE — Telephone Encounter (Signed)
LMTCB x1 for Pre-Admitting.

## 2020-01-17 NOTE — Telephone Encounter (Signed)
Lindsay Mason's husband, Don,calling back b/c someone called her to explain a procedure she's getting tomorrow but she was confused. He wanted to know if it could be explained to him instead. Can be reached on home phone at [phone] or his cell at [phone]

## 2020-01-17 NOTE — Telephone Encounter (Signed)
Order placed with urgency for Super D to be done prior to navigation on Thursday

## 2020-01-18 ENCOUNTER — Ambulatory Visit (HOSPITAL_COMMUNITY)
Admission: RE | Admit: 2020-01-18 | Discharge: 2020-01-18 | Disposition: A | Discharge status: Home / Self Care | Payer: Medicare HMO | Source: Ambulatory Visit | Attending: Thoracic Surgery (Cardiothoracic Vascular Surgery) | Admitting: Thoracic Surgery (Cardiothoracic Vascular Surgery)

## 2020-01-18 ENCOUNTER — Other Ambulatory Visit: Payer: Self-pay

## 2020-01-18 ENCOUNTER — Other Ambulatory Visit (HOSPITAL_COMMUNITY)
Admission: RE | Admit: 2020-01-18 | Discharge: 2020-01-18 | Disposition: A | Discharge status: Home / Self Care | Payer: Medicare HMO | Source: Ambulatory Visit

## 2020-01-18 ENCOUNTER — Ambulatory Visit (INDEPENDENT_AMBULATORY_CARE_PROVIDER_SITE_OTHER)
Admission: RE | Admit: 2020-01-18 | Discharge: 2020-01-18 | Disposition: A | Discharge status: Home / Self Care | Payer: Medicare HMO | Source: Ambulatory Visit | Attending: Pulmonary Disease | Admitting: Pulmonary Disease

## 2020-01-18 ENCOUNTER — Encounter (HOSPITAL_COMMUNITY): Payer: Self-pay

## 2020-01-18 ENCOUNTER — Other Ambulatory Visit: Payer: Self-pay | Admitting: Family Medicine

## 2020-01-18 ENCOUNTER — Telehealth: Payer: Self-pay

## 2020-01-18 ENCOUNTER — Encounter (HOSPITAL_COMMUNITY)
Admission: RE | Admit: 2020-01-18 | Discharge: 2020-01-18 | Disposition: A | Discharge status: Home / Self Care | Payer: Medicare HMO | Source: Ambulatory Visit

## 2020-01-18 DIAGNOSIS — Z1231 Encounter for screening mammogram for malignant neoplasm of breast: Secondary | ICD-10-CM

## 2020-01-18 DIAGNOSIS — R911 Solitary pulmonary nodule: Secondary | ICD-10-CM

## 2020-01-18 DIAGNOSIS — N3000 Acute cystitis without hematuria: Secondary | ICD-10-CM

## 2020-01-18 LAB — CBC
HCT: 45.5 % (ref 36.0–46.0)
Hemoglobin: 14.3 g/dL (ref 12.0–15.0)
MCH: 31.4 pg (ref 26.0–34.0)
MCHC: 31.4 g/dL (ref 30.0–36.0)
MCV: 100 fL (ref 80.0–100.0)
Platelets: 223 10*3/uL (ref 150–400)
RBC: 4.55 MIL/uL (ref 3.87–5.11)
RDW: 13.2 % (ref 11.5–15.5)
WBC: 5 10*3/uL (ref 4.0–10.5)
nRBC: 0 % (ref 0.0–0.2)

## 2020-01-18 LAB — BLOOD GAS, ARTERIAL
Acid-Base Excess: 0.1 mmol/L (ref 0.0–2.0)
Bicarbonate: 24 mmol/L (ref 20.0–28.0)
Drawn by: 421801
FIO2: 21
O2 Saturation: 96.9 %
Patient temperature: 37
pCO2 arterial: 37.1 mmHg (ref 32.0–48.0)
pH, Arterial: 7.427 (ref 7.350–7.450)
pO2, Arterial: 85.5 mmHg (ref 83.0–108.0)

## 2020-01-18 LAB — COMPREHENSIVE METABOLIC PANEL
ALT: 16 U/L (ref 0–44)
AST: 20 U/L (ref 15–41)
Albumin: 3.8 g/dL (ref 3.5–5.0)
Alkaline Phosphatase: 84 U/L (ref 38–126)
Anion gap: 11 (ref 5–15)
BUN: 16 mg/dL (ref 8–23)
CO2: 21 mmol/L — ABNORMAL LOW (ref 22–32)
Calcium: 8.9 mg/dL (ref 8.9–10.3)
Chloride: 108 mmol/L (ref 98–111)
Creatinine, Ser: 0.66 mg/dL (ref 0.44–1.00)
GFR calc Af Amer: >60 mL/min (ref >60)
GFR calc non Af Amer: >60 mL/min (ref >60)
Glucose, Bld: 94 mg/dL (ref 70–99)
Potassium: 4.1 mmol/L (ref 3.5–5.1)
Sodium: 140 mmol/L (ref 135–145)
Total Bilirubin: 0.9 mg/dL (ref 0.3–1.2)
Total Protein: 5.9 g/dL — ABNORMAL LOW (ref 6.5–8.1)

## 2020-01-18 LAB — PROTIME-INR
INR: 1 (ref 0.8–1.2)
Prothrombin Time: 13 seconds (ref 11.4–15.2)

## 2020-01-18 LAB — URINALYSIS, ROUTINE W REFLEX MICROSCOPIC
Bilirubin Urine: NEGATIVE
Glucose, UA: NEGATIVE mg/dL
Hgb urine dipstick: NEGATIVE
Ketones, ur: NEGATIVE mg/dL
Nitrite: NEGATIVE
Protein, ur: NEGATIVE mg/dL
Specific Gravity, Urine: 1.012 (ref 1.005–1.030)
pH: 6 (ref 5.0–8.0)

## 2020-01-18 LAB — SARS CORONAVIRUS 2 (TAT 6-24 HRS): SARS Coronavirus 2: NEGATIVE

## 2020-01-18 LAB — SURGICAL PCR SCREEN
MRSA, PCR: NEGATIVE
Staphylococcus aureus: NEGATIVE

## 2020-01-18 LAB — APTT: aPTT: 25 seconds (ref 24–36)

## 2020-01-18 LAB — ABO/RH: ABO/RH(D): A POS

## 2020-01-18 MED ORDER — CIPROFLOXACIN HCL 500 MG PO TABS: 500.0000 mg | ORAL_TABLET | Freq: Two times a day (BID) | ORAL | 0 refills | Status: DC

## 2020-01-18 NOTE — Telephone Encounter (Signed)
RX for Cipro 500 mg BID x 3 days [facility] Patient is aware

## 2020-01-18 NOTE — Progress Notes (Addendum)
PCP - Ria Bush Cardiologist - denies  Chest x-ray - 01/18/20 EKG - 01/18/20 Stress Test - 2013 ECHO - denies Cardiac Cath - denies   COVID TEST- 01/18/20   Anesthesia review: yes, lung hx Patients husband stated that she has a touch of dementia but the patient is very sensitive about that. I did not see it listed in her hisotry nor her PCP notes  Made Thurmond Butts brooks Rn aware of patients urine, left message for her  Patient denies shortness of breath, fever, cough and chest pain at PAT appointment   All instructions explained to the patient, with a verbal understanding of the material. Patient agrees to go over the instructions while at home for a better understanding. Patient also instructed to self quarantine after being tested for COVID-19. The opportunity to ask questions was provided.

## 2020-01-19 ENCOUNTER — Encounter (HOSPITAL_COMMUNITY): Payer: Self-pay | Admitting: Pulmonary Disease

## 2020-01-19 ENCOUNTER — Encounter (HOSPITAL_COMMUNITY): Payer: Self-pay | Admitting: Certified Registered Nurse Anesthetist

## 2020-01-19 ENCOUNTER — Encounter (HOSPITAL_COMMUNITY): Payer: Self-pay | Admitting: Physician Assistant

## 2020-01-19 NOTE — Progress Notes (Signed)
Anesthesia Chart Review:  Pt seen by cardiology in 2013 for eval of chest tightness. She underwent an exercise stress test showing excellent exercise tolerance and no EKG changes concerning for ischemia. No further workup was recommended.   Per Dr. Abran Duke notes, he initially ordered a preop stress test but on further evaluation felt it was not necessary. I reached out to his office for input and he advised it was not needed prior to surgery. Her Zubrod score is 0 and she is active without any CV symptoms. Per PCP notes she normally walks 45-60 min/day.  Preop labs reviewed, unremarkable.   EKG 01/18/20: Normal sinus rhythm. Rate 68. Possible Left atrial enlargement. Nonspecific ST abnormality  PFTs 01/03/20:  FVC-%Pred-Pre Latest Units: % 96  FEV1-%Pred-Pre Latest Units: % 83  FEV1FVC-%Pred-Pre Latest Units: % 85  TLC % pred Latest Units: % 118  DLCO unc % pred Latest Units: % 100    Exercise treadmill test 10/22/11: FINAL IMPRESSION: Normal exercise stress test. No significant EKG changes concerning for ischemia. PVCs noted with exercise. She was asymptomatic. Excellent exercise tolerance.   Wynonia Musty [facility] Stay [facility]/Anesthesiology Phone [phone] 01/19/2020 10:54 AM

## 2020-01-19 NOTE — Anesthesia Preprocedure Evaluation (Deleted)
Anesthesia Evaluation    Airway        Dental   Pulmonary former smoker,           Cardiovascular      Neuro/Psych    GI/Hepatic   Endo/Other    Renal/GU      Musculoskeletal   Abdominal   Peds  Hematology   Anesthesia Other Findings   Reproductive/Obstetrics                             Anesthesia Physical Anesthesia Plan  ASA:   Anesthesia Plan:    Post-op Pain Management:    Induction:   PONV Risk Score and Plan:   Airway Management Planned:   Additional Equipment:   Intra-op Plan:   Post-operative Plan:   Informed Consent:   Plan Discussed with:   Anesthesia Plan Comments: (Pt seen by cardiology in 2013 for eval of chest tightness. She underwent an exercise stress test showing excellent exercise tolerance and no EKG changes concerning for ischemia. No further workup was recommended.   Per Dr. Abran Duke notes, he initially ordered a preop stress test but on further evaluation felt it was not necessary. I reached out to his office for input and he advised it was not needed prior to surgery. Her Zubrod score is 0 and she is active without any CV symptoms. Per PCP notes she normally walks 45-60 min/day.  Preop labs reviewed, unremarkable.   EKG 01/18/20: Normal sinus rhythm. Rate 68. Possible Left atrial enlargement. Nonspecific ST abnormality  PFTs 01/03/20:  FVC-%Pred-Pre Latest Units: % 96 FEV1-%Pred-Pre Latest Units: % 83 FEV1FVC-%Pred-Pre Latest Units: % 85 TLC % pred Latest Units: % 118 DLCO unc % pred Latest Units: % 100   Exercise treadmill test 10/22/11: FINAL IMPRESSION: Normal exercise stress test. No significant EKG changes concerning for ischemia. PVCs noted with exercise. She was asymptomatic. Excellent exercise tolerance. )        Anesthesia Quick Evaluation

## 2020-01-20 ENCOUNTER — Other Ambulatory Visit: Payer: Self-pay

## 2020-01-20 ENCOUNTER — Encounter (HOSPITAL_COMMUNITY)
Admission: RE | Disposition: A | Discharge status: Home / Self Care | Payer: Self-pay | Source: Home / Self Care | Attending: Thoracic Surgery (Cardiothoracic Vascular Surgery)

## 2020-01-20 ENCOUNTER — Inpatient Hospital Stay (HOSPITAL_COMMUNITY)
Admission: RE | Admit: 2020-01-20 | Discharge: 2020-01-22 | DRG: 165 | Disposition: A | Discharge status: Home / Self Care | Payer: Medicare HMO | Attending: Thoracic Surgery (Cardiothoracic Vascular Surgery) | Admitting: Thoracic Surgery (Cardiothoracic Vascular Surgery)

## 2020-01-20 ENCOUNTER — Ambulatory Visit (HOSPITAL_COMMUNITY): Payer: Medicare HMO | Admitting: Certified Registered"

## 2020-01-20 ENCOUNTER — Encounter (HOSPITAL_COMMUNITY): Payer: Self-pay | Admitting: Pulmonary Disease

## 2020-01-20 ENCOUNTER — Ambulatory Visit (HOSPITAL_COMMUNITY): Payer: Medicare HMO

## 2020-01-20 ENCOUNTER — Inpatient Hospital Stay (HOSPITAL_COMMUNITY): Payer: Medicare HMO

## 2020-01-20 DIAGNOSIS — I739 Peripheral vascular disease, unspecified: Secondary | ICD-10-CM | POA: Diagnosis present

## 2020-01-20 DIAGNOSIS — Z801 Family history of malignant neoplasm of trachea, bronchus and lung: Secondary | ICD-10-CM | POA: Diagnosis not present

## 2020-01-20 DIAGNOSIS — M419 Scoliosis, unspecified: Secondary | ICD-10-CM | POA: Diagnosis present

## 2020-01-20 DIAGNOSIS — Z79899 Other long term (current) drug therapy: Secondary | ICD-10-CM

## 2020-01-20 DIAGNOSIS — Z85118 Personal history of other malignant neoplasm of bronchus and lung: Secondary | ICD-10-CM

## 2020-01-20 DIAGNOSIS — Z8262 Family history of osteoporosis: Secondary | ICD-10-CM | POA: Diagnosis not present

## 2020-01-20 DIAGNOSIS — R112 Nausea with vomiting, unspecified: Secondary | ICD-10-CM | POA: Diagnosis not present

## 2020-01-20 DIAGNOSIS — J302 Other seasonal allergic rhinitis: Secondary | ICD-10-CM | POA: Diagnosis present

## 2020-01-20 DIAGNOSIS — Z7289 Other problems related to lifestyle: Secondary | ICD-10-CM | POA: Diagnosis not present

## 2020-01-20 DIAGNOSIS — Z8249 Family history of ischemic heart disease and other diseases of the circulatory system: Secondary | ICD-10-CM | POA: Diagnosis not present

## 2020-01-20 DIAGNOSIS — E278 Other specified disorders of adrenal gland: Secondary | ICD-10-CM | POA: Diagnosis present

## 2020-01-20 DIAGNOSIS — F17290 Nicotine dependence, other tobacco product, uncomplicated: Secondary | ICD-10-CM | POA: Diagnosis present

## 2020-01-20 DIAGNOSIS — Z7982 Long term (current) use of aspirin: Secondary | ICD-10-CM

## 2020-01-20 DIAGNOSIS — Z902 Acquired absence of lung [part of]: Secondary | ICD-10-CM

## 2020-01-20 DIAGNOSIS — Z20822 Contact with and (suspected) exposure to covid-19: Secondary | ICD-10-CM | POA: Diagnosis present

## 2020-01-20 DIAGNOSIS — R942 Abnormal results of pulmonary function studies: Secondary | ICD-10-CM

## 2020-01-20 DIAGNOSIS — R911 Solitary pulmonary nodule: Secondary | ICD-10-CM

## 2020-01-20 DIAGNOSIS — Z09 Encounter for follow-up examination after completed treatment for conditions other than malignant neoplasm: Secondary | ICD-10-CM

## 2020-01-20 DIAGNOSIS — C3431 Malignant neoplasm of lower lobe, right bronchus or lung: Principal | ICD-10-CM | POA: Diagnosis present

## 2020-01-20 DIAGNOSIS — Z880 Allergy status to penicillin: Secondary | ICD-10-CM | POA: Diagnosis not present

## 2020-01-20 DIAGNOSIS — Z87891 Personal history of nicotine dependence: Secondary | ICD-10-CM | POA: Diagnosis not present

## 2020-01-20 DIAGNOSIS — J939 Pneumothorax, unspecified: Secondary | ICD-10-CM

## 2020-01-20 DIAGNOSIS — I700 Atherosclerosis of aorta: Secondary | ICD-10-CM | POA: Diagnosis present

## 2020-01-20 DIAGNOSIS — C3491 Malignant neoplasm of unspecified part of right bronchus or lung: Secondary | ICD-10-CM

## 2020-01-20 DIAGNOSIS — M810 Age-related osteoporosis without current pathological fracture: Secondary | ICD-10-CM | POA: Diagnosis present

## 2020-01-20 DIAGNOSIS — J449 Chronic obstructive pulmonary disease, unspecified: Secondary | ICD-10-CM | POA: Diagnosis present

## 2020-01-20 HISTORY — PX: VIDEO ASSISTED THORACOSCOPY (VATS)/WEDGE RESECTION: SHX6174

## 2020-01-20 HISTORY — PX: ELECTROMAGNETIC NAVIGATION BROCHOSCOPY: SHX5369

## 2020-01-20 HISTORY — PX: NODE DISSECTION: SHX5269

## 2020-01-20 HISTORY — PX: VIDEO BRONCHOSCOPY: SHX5072

## 2020-01-20 HISTORY — PX: INTERCOSTAL NERVE BLOCK: SHX5021

## 2020-01-20 HISTORY — PX: SUBMUCOSAL TATTOO INJECTION: SHX6856

## 2020-01-20 LAB — POCT I-STAT 7, (LYTES, BLD GAS, ICA,H+H)
Acid-base deficit: 4 mmol/L — ABNORMAL HIGH (ref 0.0–2.0)
Acid-base deficit: 2 mmol/L (ref 0.0–2.0)
Acid-base deficit: 2 mmol/L (ref 0.0–2.0)
Bicarbonate: 26.8 mmol/L (ref 20.0–28.0)
Bicarbonate: 26.1 mmol/L (ref 20.0–28.0)
Bicarbonate: 27 mmol/L (ref 20.0–28.0)
Calcium, Ion: 1.25 mmol/L (ref 1.15–1.40)
Calcium, Ion: 1.18 mmol/L (ref 1.15–1.40)
Calcium, Ion: 1.19 mmol/L (ref 1.15–1.40)
HCT: 39 % (ref 36.0–46.0)
HCT: 38 % (ref 36.0–46.0)
HCT: 37 % (ref 36.0–46.0)
Hemoglobin: 13.3 g/dL (ref 12.0–15.0)
Hemoglobin: 12.9 g/dL (ref 12.0–15.0)
Hemoglobin: 12.6 g/dL (ref 12.0–15.0)
O2 Saturation: 92 %
O2 Saturation: 94 %
O2 Saturation: 95 %
Patient temperature: 34.5
Patient temperature: 34.3
Potassium: 3.7 mmol/L (ref 3.5–5.1)
Potassium: 3.7 mmol/L (ref 3.5–5.1)
Potassium: 3.6 mmol/L (ref 3.5–5.1)
Sodium: 138 mmol/L (ref 135–145)
Sodium: 137 mmol/L (ref 135–145)
Sodium: 138 mmol/L (ref 135–145)
TCO2: 29 mmol/L (ref 22–32)
TCO2: 28 mmol/L (ref 22–32)
TCO2: 29 mmol/L (ref 22–32)
pCO2 arterial: 67.2 mmHg (ref 32.0–48.0)
pCO2 arterial: 54.9 mmHg — ABNORMAL HIGH (ref 32.0–48.0)
pCO2 arterial: 62.7 mmHg — ABNORMAL HIGH (ref 32.0–48.0)
pH, Arterial: 7.194 — CL (ref 7.350–7.450)
pH, Arterial: 7.272 — ABNORMAL LOW (ref 7.350–7.450)
pH, Arterial: 7.241 — ABNORMAL LOW (ref 7.350–7.450)
pO2, Arterial: 72 mmHg — ABNORMAL LOW (ref 83.0–108.0)
pO2, Arterial: 72 mmHg — ABNORMAL LOW (ref 83.0–108.0)
pO2, Arterial: 88 mmHg (ref 83.0–108.0)

## 2020-01-20 LAB — PREPARE RBC (CROSSMATCH)

## 2020-01-20 SURGERY — BRONCHOSCOPY, WITH FLUOROSCOPY
Anesthesia: General

## 2020-01-20 SURGERY — LOBECTOMY, LUNG, ROBOT-ASSISTED, USING VATS
Anesthesia: General | Site: Chest | Laterality: Right

## 2020-01-20 MED ORDER — PROPOFOL 10 MG/ML IV BOLUS
INTRAVENOUS | Status: DC | PRN
  Administered 2020-01-20 (×2): 30 mg via INTRAVENOUS
  Administered 2020-01-20: 150 mg via INTRAVENOUS
  Administered 2020-01-20: 50 mg via INTRAVENOUS

## 2020-01-20 MED ORDER — MIDAZOLAM HCL 5 MG/5ML IJ SOLN
INTRAMUSCULAR | Status: DC | PRN
  Administered 2020-01-20: 2 mg via INTRAVENOUS

## 2020-01-20 MED ORDER — KETOROLAC TROMETHAMINE 30 MG/ML IJ SOLN
INTRAMUSCULAR | Status: AC
  Filled 2020-01-20: qty 1

## 2020-01-20 MED ORDER — SUGAMMADEX SODIUM 200 MG/2ML IV SOLN
INTRAVENOUS | Status: DC | PRN
  Administered 2020-01-20: 200 mg via INTRAVENOUS

## 2020-01-20 MED ORDER — SENNOSIDES-DOCUSATE SODIUM 8.6-50 MG PO TABS: 1.0000 | ORAL_TABLET | Freq: Every day | ORAL | Status: DC

## 2020-01-20 MED ORDER — PROPOFOL 10 MG/ML IV BOLUS
INTRAVENOUS | Status: AC
  Filled 2020-01-20: qty 40

## 2020-01-20 MED ORDER — ONDANSETRON HCL 4 MG/2ML IJ SOLN
INTRAMUSCULAR | Status: DC | PRN
  Administered 2020-01-20: 4 mg via INTRAVENOUS

## 2020-01-20 MED ORDER — PROMETHAZINE HCL 25 MG/ML IJ SOLN: 6.2500 mg | INTRAMUSCULAR | Status: DC | PRN

## 2020-01-20 MED ORDER — KETOROLAC TROMETHAMINE 30 MG/ML IJ SOLN
INTRAMUSCULAR | Status: DC | PRN
  Administered 2020-01-20: 30 mg via INTRAVENOUS

## 2020-01-20 MED ORDER — HEMOSTATIC AGENTS (NO CHARGE) OPTIME
TOPICAL | Status: DC | PRN
  Administered 2020-01-20: 3 via TOPICAL

## 2020-01-20 MED ORDER — BISACODYL 5 MG PO TBEC
10.0000 mg | DELAYED_RELEASE_TABLET | Freq: Every day | ORAL | Status: DC
  Administered 2020-01-20 – 2020-01-21 (×2): 10 mg via ORAL
  Filled 2020-01-20 (×3): qty 2

## 2020-01-20 MED ORDER — KETOROLAC TROMETHAMINE 30 MG/ML IJ SOLN
30.0000 mg | Freq: Four times a day (QID) | INTRAMUSCULAR | Status: DC
  Administered 2020-01-20 – 2020-01-21 (×6): 30 mg via INTRAVENOUS
  Filled 2020-01-20 (×5): qty 1

## 2020-01-20 MED ORDER — ALBUMIN HUMAN 5 % IV SOLN: INTRAVENOUS | Status: DC | PRN

## 2020-01-20 MED ORDER — FENTANYL CITRATE (PF) 250 MCG/5ML IJ SOLN
INTRAMUSCULAR | Status: AC
  Filled 2020-01-20: qty 5

## 2020-01-20 MED ORDER — METHYLENE BLUE 0.5 % INJ SOLN
INTRAVENOUS | Status: DC | PRN
  Administered 2020-01-20: .75 mL via SUBMUCOSAL

## 2020-01-20 MED ORDER — SODIUM CHLORIDE 0.9 % IR SOLN
Status: DC | PRN
  Administered 2020-01-20: 1000 mL

## 2020-01-20 MED ORDER — SODIUM CHLORIDE 0.9 % IV SOLN: INTRAVENOUS | Status: DC | PRN

## 2020-01-20 MED ORDER — LIDOCAINE 2% (20 MG/ML) 5 ML SYRINGE
INTRAMUSCULAR | Status: DC | PRN
  Administered 2020-01-20: 80 mg via INTRAVENOUS

## 2020-01-20 MED ORDER — LACTATED RINGERS IV SOLN: INTRAVENOUS | Status: DC | PRN

## 2020-01-20 MED ORDER — ENOXAPARIN SODIUM 40 MG/0.4ML ~~LOC~~ SOLN
40.0000 mg | Freq: Every day | SUBCUTANEOUS | Status: DC
  Filled 2020-01-20: qty 0.4

## 2020-01-20 MED ORDER — SODIUM CHLORIDE FLUSH 0.9 % IV SOLN
INTRAVENOUS | Status: DC | PRN
  Administered 2020-01-20: 100 mL

## 2020-01-20 MED ORDER — DEXAMETHASONE SODIUM PHOSPHATE 10 MG/ML IJ SOLN
INTRAMUSCULAR | Status: DC | PRN
  Administered 2020-01-20: 10 mg via INTRAVENOUS

## 2020-01-20 MED ORDER — EPHEDRINE SULFATE-NACL 50-0.9 MG/10ML-% IV SOSY
PREFILLED_SYRINGE | INTRAVENOUS | Status: DC | PRN
  Administered 2020-01-20 (×2): 5 mg via INTRAVENOUS

## 2020-01-20 MED ORDER — ACETAMINOPHEN 500 MG PO TABS
1000.0000 mg | ORAL_TABLET | Freq: Four times a day (QID) | ORAL | Status: DC
  Administered 2020-01-20 – 2020-01-22 (×6): 1000 mg via ORAL
  Filled 2020-01-20 (×5): qty 2

## 2020-01-20 MED ORDER — ACETAMINOPHEN 160 MG/5ML PO SOLN: 1000.0000 mg | Freq: Four times a day (QID) | ORAL | Status: DC

## 2020-01-20 MED ORDER — 0.9 % SODIUM CHLORIDE (POUR BTL) OPTIME
TOPICAL | Status: DC | PRN
  Administered 2020-01-20: 1000 mL

## 2020-01-20 MED ORDER — VANCOMYCIN HCL IN DEXTROSE 1-5 GM/200ML-% IV SOLN
INTRAVENOUS | Status: AC
  Filled 2020-01-20: qty 200

## 2020-01-20 MED ORDER — IPRATROPIUM-ALBUTEROL 0.5-2.5 (3) MG/3ML IN SOLN
3.0000 mL | Freq: Four times a day (QID) | RESPIRATORY_TRACT | Status: DC
  Administered 2020-01-20: 20:00:00 3 mL via RESPIRATORY_TRACT
  Filled 2020-01-20: qty 3

## 2020-01-20 MED ORDER — FENTANYL CITRATE (PF) 100 MCG/2ML IJ SOLN
25.0000 ug | INTRAMUSCULAR | Status: DC | PRN
  Administered 2020-01-20 (×2): 25 ug via INTRAVENOUS

## 2020-01-20 MED ORDER — ONDANSETRON HCL 4 MG/2ML IJ SOLN
4.0000 mg | Freq: Four times a day (QID) | INTRAMUSCULAR | Status: DC | PRN
  Administered 2020-01-20 – 2020-01-21 (×2): 4 mg via INTRAVENOUS
  Filled 2020-01-20 (×2): qty 2

## 2020-01-20 MED ORDER — ROCURONIUM BROMIDE 10 MG/ML (PF) SYRINGE
PREFILLED_SYRINGE | INTRAVENOUS | Status: DC | PRN
  Administered 2020-01-20: 50 mg via INTRAVENOUS
  Administered 2020-01-20: 20 mg via INTRAVENOUS
  Administered 2020-01-20: 50 mg via INTRAVENOUS
  Administered 2020-01-20: 20 mg via INTRAVENOUS
  Administered 2020-01-20: 30 mg via INTRAVENOUS

## 2020-01-20 MED ORDER — VANCOMYCIN HCL IN DEXTROSE 1-5 GM/200ML-% IV SOLN
1000.0000 mg | Freq: Two times a day (BID) | INTRAVENOUS | Status: AC
  Administered 2020-01-20: 1000 mg via INTRAVENOUS
  Filled 2020-01-20: qty 200

## 2020-01-20 MED ORDER — MORPHINE SULFATE (PF) 2 MG/ML IV SOLN
2.0000 mg | INTRAVENOUS | Status: DC | PRN
  Administered 2020-01-20 – 2020-01-21 (×3): 2 mg via INTRAVENOUS
  Filled 2020-01-20 (×3): qty 1

## 2020-01-20 MED ORDER — TRAMADOL HCL 50 MG PO TABS
50.0000 mg | ORAL_TABLET | Freq: Four times a day (QID) | ORAL | Status: DC | PRN
  Administered 2020-01-20 – 2020-01-21 (×2): 100 mg via ORAL
  Filled 2020-01-20 (×2): qty 2

## 2020-01-20 MED ORDER — MIDAZOLAM HCL 2 MG/2ML IJ SOLN
INTRAMUSCULAR | Status: AC
  Filled 2020-01-20: qty 2

## 2020-01-20 MED ORDER — VANCOMYCIN HCL IN DEXTROSE 1-5 GM/200ML-% IV SOLN
1000.0000 mg | INTRAVENOUS | Status: AC
  Administered 2020-01-20: 1000 mg via INTRAVENOUS

## 2020-01-20 MED ORDER — SODIUM CHLORIDE 0.9% IV SOLUTION: Freq: Once | INTRAVENOUS | Status: DC

## 2020-01-20 MED ORDER — FENTANYL CITRATE (PF) 250 MCG/5ML IJ SOLN
INTRAMUSCULAR | Status: DC | PRN
  Administered 2020-01-20: 50 ug via INTRAVENOUS
  Administered 2020-01-20: 100 ug via INTRAVENOUS
  Administered 2020-01-20 (×3): 50 ug via INTRAVENOUS

## 2020-01-20 MED ORDER — PHENYLEPHRINE 40 MCG/ML (10ML) SYRINGE FOR IV PUSH (FOR BLOOD PRESSURE SUPPORT)
PREFILLED_SYRINGE | INTRAVENOUS | Status: DC | PRN
  Administered 2020-01-20: 40 ug via INTRAVENOUS
  Administered 2020-01-20 (×2): 80 ug via INTRAVENOUS

## 2020-01-20 MED ORDER — FENTANYL CITRATE (PF) 100 MCG/2ML IJ SOLN
INTRAMUSCULAR | Status: AC
  Filled 2020-01-20: qty 2

## 2020-01-20 MED ORDER — INDOCYANINE GREEN 25 MG IV SOLR
INTRAVENOUS | Status: DC | PRN
  Administered 2020-01-20: .75 mg via OPHTHALMIC

## 2020-01-20 MED ORDER — ACETAMINOPHEN 500 MG PO TABS
1000.0000 mg | ORAL_TABLET | Freq: Once | ORAL | Status: AC
  Administered 2020-01-20: 1000 mg via ORAL
  Filled 2020-01-20: qty 2

## 2020-01-20 MED ORDER — BUPIVACAINE LIPOSOME 1.3 % IJ SUSP
20.0000 mL | Freq: Once | INTRAMUSCULAR | Status: DC
  Filled 2020-01-20: qty 20

## 2020-01-20 MED ORDER — CIPROFLOXACIN HCL 500 MG PO TABS
500.0000 mg | ORAL_TABLET | Freq: Two times a day (BID) | ORAL | Status: AC
  Administered 2020-01-20 – 2020-01-22 (×4): 500 mg via ORAL
  Filled 2020-01-20 (×4): qty 1

## 2020-01-20 SURGICAL SUPPLY — 132 items
BLADE CLIPPER SURG (BLADE) IMPLANT
BLADE SURG 11 STRL SS (BLADE) ×4 IMPLANT
BNDG COHESIVE 6X5 TAN STRL LF (GAUZE/BANDAGES/DRESSINGS) ×4 IMPLANT
CANISTER SUCT 3000ML PPV (MISCELLANEOUS) ×8 IMPLANT
CANNULA REDUC XI 12-8 STAPL (CANNULA) ×6
CANNULA REDUC XI 12-8MM STAPL (CANNULA) ×2
CANNULA REDUCER 12-8 DVNC XI (CANNULA) ×4 IMPLANT
CATH THORACIC 28FR (CATHETERS) ×4 IMPLANT
CATH THORACIC 28FR RT ANG (CATHETERS) IMPLANT
CATH THORACIC 36FR (CATHETERS) IMPLANT
CATH THORACIC 36FR RT ANG (CATHETERS) IMPLANT
CATH TROCAR 20FR (CATHETERS) IMPLANT
CHLORAPREP W/TINT 26 (MISCELLANEOUS) ×4 IMPLANT
CLIP VESOCCLUDE MED 6/CT (CLIP) ×4 IMPLANT
CNTNR URN SCR LID CUP LEK RST (MISCELLANEOUS) ×12 IMPLANT
CONN ST 1/4X3/8  BEN (MISCELLANEOUS)
CONN ST 1/4X3/8 BEN (MISCELLANEOUS) IMPLANT
CONN Y 3/8X3/8X3/8  BEN (MISCELLANEOUS)
CONN Y 3/8X3/8X3/8 BEN (MISCELLANEOUS) IMPLANT
CONT SPEC 4OZ STRL OR WHT (MISCELLANEOUS) ×24
COVER SURGICAL LIGHT HANDLE (MISCELLANEOUS) IMPLANT
DEFOGGER SCOPE WARMER CLEARIFY (MISCELLANEOUS) ×4 IMPLANT
DERMABOND ADVANCED (GAUZE/BANDAGES/DRESSINGS) ×2
DERMABOND ADVANCED .7 DNX12 (GAUZE/BANDAGES/DRESSINGS) ×2 IMPLANT
DISSECTOR BLUNT TIP ENDO 5MM (MISCELLANEOUS) IMPLANT
DRAIN CHANNEL 28F RND 3/8 FF (WOUND CARE) IMPLANT
DRAIN CHANNEL 32F RND 10.7 FF (WOUND CARE) IMPLANT
DRAPE ARM DVNC X/XI (DISPOSABLE) ×8 IMPLANT
DRAPE COLUMN DVNC XI (DISPOSABLE) ×2 IMPLANT
DRAPE CV SPLIT W-CLR ANES SCRN (DRAPES) ×4 IMPLANT
DRAPE DA VINCI XI ARM (DISPOSABLE) ×16
DRAPE DA VINCI XI COLUMN (DISPOSABLE) ×4
DRAPE ORTHO SPLIT 77X108 STRL (DRAPES) ×4
DRAPE SURG ORHT 6 SPLT 77X108 (DRAPES) ×2 IMPLANT
DRAPE WARM FLUID 44X44 (DRAPES) IMPLANT
ELECT BLADE 6.5 EXT (BLADE) IMPLANT
ELECT REM PT RETURN 9FT ADLT (ELECTROSURGICAL) ×4
ELECTRODE REM PT RTRN 9FT ADLT (ELECTROSURGICAL) ×2 IMPLANT
GAUZE KITTNER 4X10 (MISCELLANEOUS) ×8 IMPLANT
GAUZE KITTNER 4X8 (MISCELLANEOUS) IMPLANT
GAUZE SPONGE 4X4 12PLY STRL (GAUZE/BANDAGES/DRESSINGS) ×4 IMPLANT
GAUZE SPONGE 4X4 12PLY STRL LF (GAUZE/BANDAGES/DRESSINGS) ×4 IMPLANT
GLOVE BIO SURGEON STRL SZ 6 (GLOVE) ×4 IMPLANT
GLOVE BIO SURGEON STRL SZ 6.5 (GLOVE) ×6 IMPLANT
GLOVE BIO SURGEON STRL SZ7 (GLOVE) ×4 IMPLANT
GLOVE BIO SURGEON STRL SZ7.5 (GLOVE) ×8 IMPLANT
GLOVE BIO SURGEON STRL SZ8 (GLOVE) ×4 IMPLANT
GLOVE BIO SURGEONS STRL SZ 6.5 (GLOVE) ×2
GLOVE BIOGEL PI IND STRL 7.5 (GLOVE) ×2 IMPLANT
GLOVE BIOGEL PI IND STRL 8.5 (GLOVE) ×2 IMPLANT
GLOVE BIOGEL PI INDICATOR 7.5 (GLOVE) ×2
GLOVE BIOGEL PI INDICATOR 8.5 (GLOVE) ×2
GLOVE ECLIPSE 7.5 STRL STRAW (GLOVE) ×4 IMPLANT
GLOVE SURG SS PI 7.5 STRL IVOR (GLOVE) ×4 IMPLANT
GOWN STRL REUS W/ TWL LRG LVL3 (GOWN DISPOSABLE) ×4 IMPLANT
GOWN STRL REUS W/ TWL XL LVL3 (GOWN DISPOSABLE) ×6 IMPLANT
GOWN STRL REUS W/TWL LRG LVL3 (GOWN DISPOSABLE) ×8
GOWN STRL REUS W/TWL XL LVL3 (GOWN DISPOSABLE) ×12
HEMOSTAT SURGICEL 2X14 (HEMOSTASIS) ×12 IMPLANT
IRRIGATION STRYKERFLOW (MISCELLANEOUS) ×2 IMPLANT
IRRIGATOR STRYKERFLOW (MISCELLANEOUS) ×4
IRRIGATOR SUCT 8 DISP DVNC XI (IRRIGATION / IRRIGATOR) ×2 IMPLANT
IRRIGATOR SUCTION 8MM XI DISP (IRRIGATION / IRRIGATOR) ×4
KIT BASIN OR (CUSTOM PROCEDURE TRAY) ×4 IMPLANT
KIT SUCTION CATH 14FR (SUCTIONS) IMPLANT
KIT TURNOVER KIT B (KITS) ×4 IMPLANT
LOOP VESSEL SUPERMAXI WHITE (MISCELLANEOUS) IMPLANT
NEEDLE 22X1 1/2 (OR ONLY) (NEEDLE) ×4 IMPLANT
NS IRRIG 1000ML POUR BTL (IV SOLUTION) ×12 IMPLANT
PACK CHEST (CUSTOM PROCEDURE TRAY) ×4 IMPLANT
PAD ARMBOARD 7.5X6 YLW CONV (MISCELLANEOUS) ×20 IMPLANT
POUCH ENDO CATCH II 15MM (MISCELLANEOUS) IMPLANT
POUCH SPECIMEN RETRIEVAL 10MM (ENDOMECHANICALS) IMPLANT
RELOAD STAPLER 2.5X45 WHT DVNC (STAPLE) ×6 IMPLANT
RELOAD STAPLER 3.5X45 BLU DVNC (STAPLE) ×12 IMPLANT
RELOAD STAPLER 4.3X45 GRN DVNC (STAPLE) ×2 IMPLANT
RETRACTOR WOUND ALXS 19CM XSML (INSTRUMENTS) IMPLANT
RTRCTR WOUND ALEXIS 19CM XSML (INSTRUMENTS)
SCISSORS LAP 5X35 DISP (ENDOMECHANICALS) IMPLANT
SEAL CANN UNIV 5-8 DVNC XI (MISCELLANEOUS) ×4 IMPLANT
SEAL XI 5MM-8MM UNIVERSAL (MISCELLANEOUS) ×8
SEALANT PROGEL (MISCELLANEOUS) IMPLANT
SEALANT SURG COSEAL 4ML (VASCULAR PRODUCTS) IMPLANT
SEALANT SURG COSEAL 8ML (VASCULAR PRODUCTS) IMPLANT
SEALER LIGASURE MARYLAND 30 (ELECTROSURGICAL) IMPLANT
SET TUBE SMOKE EVAC HIGH FLOW (TUBING) ×4 IMPLANT
SOLUTION ELECTROLUBE (MISCELLANEOUS) IMPLANT
SPONGE INTESTINAL PEANUT (DISPOSABLE) IMPLANT
SPONGE TONSIL TAPE 1 RFD (DISPOSABLE) IMPLANT
STAPLER 45 SUREFORM CVD (STAPLE) ×4
STAPLER 45 SUREFORM CVD DVNC (STAPLE) ×2 IMPLANT
STAPLER CANNULA SEAL DVNC XI (STAPLE) ×4 IMPLANT
STAPLER CANNULA SEAL XI (STAPLE) ×8
STAPLER RELOAD 2.5X45 WHITE (STAPLE) ×12
STAPLER RELOAD 2.5X45 WHT DVNC (STAPLE) ×6
STAPLER RELOAD 3.5X45 BLU DVNC (STAPLE) ×12
STAPLER RELOAD 3.5X45 BLUE (STAPLE) ×24
STAPLER RELOAD 4.3X45 GREEN (STAPLE) ×4
STAPLER RELOAD 4.3X45 GRN DVNC (STAPLE) ×2
STOPCOCK 4 WAY LG BORE MALE ST (IV SETS) ×4 IMPLANT
SUT MNCRL AB 3-0 PS2 18 (SUTURE) IMPLANT
SUT MNCRL AB 4-0 PS2 18 (SUTURE) ×4 IMPLANT
SUT MON AB 2-0 CT1 36 (SUTURE) IMPLANT
SUT PDS AB 1 CTX 36 (SUTURE) IMPLANT
SUT PROLENE 4 0 RB 1 (SUTURE)
SUT PROLENE 4-0 RB1 .5 CRCL 36 (SUTURE) IMPLANT
SUT SILK  1 MH (SUTURE) ×4
SUT SILK 1 MH (SUTURE) ×2 IMPLANT
SUT SILK 1 TIES 10X30 (SUTURE) IMPLANT
SUT SILK 2 0 SH (SUTURE) IMPLANT
SUT SILK 2 0SH CR/8 30 (SUTURE) IMPLANT
SUT VIC AB 1 CTX 36 (SUTURE)
SUT VIC AB 1 CTX36XBRD ANBCTR (SUTURE) IMPLANT
SUT VIC AB 2-0 CT1 27 (SUTURE) ×4
SUT VIC AB 2-0 CT1 TAPERPNT 27 (SUTURE) ×2 IMPLANT
SUT VIC AB 3-0 SH 27 (SUTURE) ×8
SUT VIC AB 3-0 SH 27X BRD (SUTURE) ×4 IMPLANT
SUT VICRYL 0 TIES 12 18 (SUTURE) ×4 IMPLANT
SUT VICRYL 0 UR6 27IN ABS (SUTURE) ×8 IMPLANT
SUT VICRYL 2 TP 1 (SUTURE) IMPLANT
SYR 10ML LL (SYRINGE) ×4 IMPLANT
SYR 20ML LL LF (SYRINGE) ×4 IMPLANT
SYR 50ML LL SCALE MARK (SYRINGE) ×4 IMPLANT
SYSTEM RETRIEVAL ANCHOR 12 (MISCELLANEOUS) ×4 IMPLANT
SYSTEM SAHARA CHEST DRAIN ATS (WOUND CARE) ×4 IMPLANT
TAPE CLOTH 4X10 WHT NS (GAUZE/BANDAGES/DRESSINGS) ×4 IMPLANT
TAPE CLOTH SURG 6X10 WHT LF (GAUZE/BANDAGES/DRESSINGS) ×4 IMPLANT
TIP APPLICATOR SPRAY EXTEND 16 (VASCULAR PRODUCTS) IMPLANT
TOWEL GREEN STERILE (TOWEL DISPOSABLE) ×4 IMPLANT
TRAY FOLEY MTR SLVR 16FR STAT (SET/KITS/TRAYS/PACK) ×4 IMPLANT
TROCAR XCEL 12X100 BLDLESS (ENDOMECHANICALS) ×4 IMPLANT
TUBING EXTENTION W/L.L. (IV SETS) ×4 IMPLANT

## 2020-01-20 SURGICAL SUPPLY — 45 items
ADAPTER BRONCH F/PENTAX (ADAPTER) ×4 IMPLANT
ADAPTER VALVE BIOPSY EBUS (MISCELLANEOUS) IMPLANT
ADPTR VALVE BIOPSY EBUS (MISCELLANEOUS)
BRUSH CYTOL CELLEBRITY 1.5X140 (MISCELLANEOUS) ×4 IMPLANT
BRUSH SUPERTRAX BIOPSY (INSTRUMENTS) IMPLANT
BRUSH SUPERTRAX NDL-TIP CYTO (INSTRUMENTS) ×4 IMPLANT
CANISTER SUCT 3000ML PPV (MISCELLANEOUS) ×4 IMPLANT
CHANNEL WORK EXTEND EDGE 180 (KITS) IMPLANT
CHANNEL WORK EXTEND EDGE 45 (KITS) IMPLANT
CHANNEL WORK EXTEND EDGE 90 (KITS) IMPLANT
CONT SPEC 4OZ CLIKSEAL STRL BL (MISCELLANEOUS) ×4 IMPLANT
COVER BACK TABLE 60X90IN (DRAPES) ×4 IMPLANT
FILTER STRAW FLUID ASPIR (MISCELLANEOUS) IMPLANT
FORCEPS BIOP SUPERTRX PREMAR (INSTRUMENTS) ×4 IMPLANT
GAUZE SPONGE 4X4 12PLY STRL (GAUZE/BANDAGES/DRESSINGS) ×4 IMPLANT
GLOVE SURG SS PI 7.5 STRL IVOR (GLOVE) ×8 IMPLANT
GOWN STRL REUS W/ TWL LRG LVL3 (GOWN DISPOSABLE) ×4 IMPLANT
GOWN STRL REUS W/TWL LRG LVL3 (GOWN DISPOSABLE) ×8
KIT CLEAN ENDO COMPLIANCE (KITS) ×4 IMPLANT
KIT LOCATABLE GUIDE (CANNULA) IMPLANT
KIT MARKER FIDUCIAL DELIVERY (KITS) IMPLANT
KIT PROCEDURE EDGE 180 (KITS) IMPLANT
KIT PROCEDURE EDGE 45 (KITS) IMPLANT
KIT PROCEDURE EDGE 90 (KITS) IMPLANT
KIT TURNOVER KIT B (KITS) ×4 IMPLANT
MARKER SKIN DUAL TIP RULER LAB (MISCELLANEOUS) ×4 IMPLANT
NEEDLE SUPERTRX PREMARK BIOPSY (NEEDLE) ×4 IMPLANT
NS IRRIG 1000ML POUR BTL (IV SOLUTION) ×4 IMPLANT
OIL SILICONE PENTAX (PARTS (SERVICE/REPAIRS)) ×4 IMPLANT
PAD ARMBOARD 7.5X6 YLW CONV (MISCELLANEOUS) ×8 IMPLANT
PATCHES PATIENT (LABEL) ×12 IMPLANT
SOL ANTI FOG 6CC (MISCELLANEOUS) ×2 IMPLANT
SOLUTION ANTI FOG 6CC (MISCELLANEOUS) ×2
SYR 20CC LL (SYRINGE) ×4 IMPLANT
SYR 20ML ECCENTRIC (SYRINGE) ×4 IMPLANT
SYR 50ML SLIP (SYRINGE) ×4 IMPLANT
TOWEL OR 17X24 6PK STRL BLUE (TOWEL DISPOSABLE) ×4 IMPLANT
TRAP SPECIMEN MUCOUS 40CC (MISCELLANEOUS) IMPLANT
TUBE CONNECTING 20'X1/4 (TUBING) ×1
TUBE CONNECTING 20X1/4 (TUBING) ×3 IMPLANT
UNDERPAD 30X30 (UNDERPADS AND DIAPERS) ×4 IMPLANT
VALVE BIOPSY  SINGLE USE (MISCELLANEOUS) ×4
VALVE BIOPSY SINGLE USE (MISCELLANEOUS) ×2 IMPLANT
VALVE SUCTION BRONCHIO DISP (MISCELLANEOUS) ×4 IMPLANT
WATER STERILE IRR 1000ML POUR (IV SOLUTION) ×4 IMPLANT

## 2020-01-20 NOTE — Interval H&P Note (Signed)
History and Physical Interval Note:  01/20/2020 7:21 AM  Lindsay Mason  has presented today for surgery, with the diagnosis of lung nodule.  The various methods of treatment have been discussed with the patient and family. After consideration of risks, benefits and other options for treatment, the patient has consented to  Procedure(s) with comments: [name] (N/A) - TO OR FOR RESECTION FOLLOWING CASE as a surgical intervention.  The patient's history has been reviewed, patient examined, no change in status, stable for surgery.  I have reviewed the patient's chart and labs.  Questions were answered to the patient's satisfaction.    Patient seen in pre-op.  We discussed the risk, benefits and alternatives.  There are no barriers to proceed.  [name]

## 2020-01-20 NOTE — Op Note (Signed)
Video Bronchoscopy with Electromagnetic Navigation with fiducial marking the indigo carmine and methylene blue dye procedure Note  Date of Operation: 01/20/2020  Pre-op Diagnosis: Right lower lobe lung nodule  Post-op Diagnosis: Right lower lobe lung nodule  Surgeon: June Leap, DO  Assistants: none   Anesthesia: General endotracheal anesthesia  Operation: Flexible video fiberoptic bronchoscopy with electromagnetic navigation and biopsies.  Estimated Blood Loss: Minimal  Complications: None   Indications and History: Lindsay Mason is a 69 y.o. female with right lower lobe lung nodule.  The risks, benefits, complications, treatment options and expected outcomes were discussed with the patient.  The possibilities of pneumothorax, pneumonia, reaction to medication, pulmonary aspiration, perforation of a viscus, bleeding, failure to diagnose a condition and creating a complication requiring transfusion or operation were discussed with the patient who freely signed the consent.    Description of Procedure: The patient was seen in the Preoperative Area, was examined and was deemed appropriate to proceed.  The patient was taken to [facility] endoscopy room 2, identified as [name] and the procedure verified as Flexible Video Fiberoptic Bronchoscopy.  A Time Out was held and the above information confirmed.   Prior to the date of the procedure a high-resolution CT scan of the chest was performed. Utilizing Geddes a virtual tracheobronchial tree was generated to allow the creation of distinct navigation pathways to the patient's parenchymal abnormalities. After being taken to the operating room general anesthesia was initiated and the patient  was orally intubated. The video fiberoptic bronchoscope was introduced via the endotracheal tube and a general inspection was performed which showed normal right and left lung anatomy no evidence endobronchial disease.  Prior to  the procedure there was lower lobe mucous plugging within the left lower lobe.  The bronchoscope was used for therapeutic suctioning and clearance of all distal airway openings to ensure no evidence of endobronchial disease. The extendable working channel and locator guide were introduced into the bronchoscope. The distinct navigation pathways prepared prior to this procedure were then utilized to navigate to within 1.8cm of patient's lesion(s) identified on CT scan. The extendable working channel was secured into place and the locator guide was withdrawn. Under fluoroscopic guidance a transbronchial spreading needle was inserted through the extendable working channel.  A syringe loaded with a 50-50 mixture of methylene blue and ICG was injected.  1.5 cc of fluid was injected at the site for fiducial marking. At the end of the procedure a general airway inspection was performed and there was no evidence of active bleeding. The bronchoscope was removed.  The patient tolerated the procedure well. There was no significant blood loss and there were no obvious complications.  Patient had the endotracheal tube removed by anesthesia and a dual-lumen endotracheal tube was placed.  Patient is being transported from endoscopy to the operating room.  [name], DO [facility] 01/20/2020 8:22 AM

## 2020-01-20 NOTE — Anesthesia Procedure Notes (Signed)
Arterial Line Insertion Start/End4/05/2020 7:00 AM Performed by: Candis Shine, CRNA, CRNA  Patient location: Pre-op. Preanesthetic checklist: patient identified, IV checked, site marked, risks and benefits discussed, surgical consent, monitors and equipment checked, pre-op evaluation, timeout performed and anesthesia consent Lidocaine 1% used for infiltration Left, radial was placed Catheter size: 20 G Hand hygiene performed  and maximum sterile barriers used   Attempts: 1 Procedure performed without using ultrasound guided technique. Following insertion, dressing applied and Biopatch. Post procedure assessment: normal and unchanged  Patient tolerated the procedure well with no immediate complications.

## 2020-01-20 NOTE — Op Note (Addendum)
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 Procedure: - Robotic assisted right video thoracoscopy -Right lower lobe wedge resection -Right lower lobectomy - Mediastinal lymph node sampling - Intercostal nerve block  Surgeon and Role:      * Gavon Majano, Lucile Crater, MD - Primary    *Dr. Pia Mau, MD- assisting Assistant: Macarthur Critchley, PA-C  Anesthesia  general EBL: 200 ml Blood Administration: None Specimen: Right lower lobe wedge resection, right lower lobe, station 7 lymph nodes, station 4 lymph nodes, station 12 months  Drains: 28 F argyle chest tube in right chest Counts: correct   Indications: 69 year old female with a 1.8 cm right lower lobe nodule concerning for primary lung cancer. It has only mildly avid on PET, but given her history of smoking I do think that a biopsy would be recommended. I personally reviewed her CT scan and PET/CT. We reviewed several options for biopsy which included a CT-guided biopsy versus navigational bronchoscopy versus surgical biopsy. Based on her CT scan, she would be an ideal candidate for navigational bronchoscopic marking followed wedge resection as combined procedure. I will discuss this with Dr. Valeta Harms. The surgical biopsy is positive for primary lung cancer she will require lobectomy.  Findings: The ICG and methylene blue marking localized a pleural abnormality.  Generous wedge resection was performed and the biopsy showed adenocarcinoma.  During isolation of right lower lobe pulmonary artery, bleeding was encountered.  Pressure was held but isolate the artery more proximally.  Only 150 mils of blood was lost, and she remained hemodynamically stable throughout.  Operative Technique: After the risks, benefits and alternatives were thoroughly discussed,  the patient was first taken to the endoscopy suite where a navigational bronchoscopy by Dr. Valeta Harms for marking of the right nodule.  Following this she was brought to the operative theatre, and the patient was then placed in a left lateral decubitus position and was prepped and draped in normal sterile fashion.  An appropriate surgical pause was performed, and pre-operative antibiotics were dosed accordingly.  We began by placing our 4 robotic ports in the the 7th intercostal space targeting the hilum of the lung.  A 68mm assistant port was placed in the 9th intercostal space in the anterior axillary line.  The robot was then docked and all instruments were passed under direct visualization.    The lung was then retracted superiorly, and the inferior pulmonary ligament was divided.  The hilum was mobilized anteriorly and posteriorly.  We identified the right lower lobe pulmonary vein, and after careful isolation, it was divided with a vascular stapler.  We next moved to the fissure.  The artery was then divided with a vascular load stapler.  The bronchus to the right lower lobe was then isolated.  After a test clamp, with good ventilation of the upper and middle lobes, the bronchus was then divided.  The fissure was completed, and the specimen was passed into an endocatch bag.  It was removed from the superior access site.    Lymph nodes were then sampled at levels 7, 4, 12.  The chest was irrigated, and an air leak test was performed.  An intercostal nerve block was performed under direct visualization.  A 28 F chest with then placed, and we watch the remaining lobes re-expand.  The skin and soft tissue were closed with absorbable suture    The patient tolerated the procedure without any immediate complications, and was transferred to the PACU in stable condition.  Dustin Burrill Bary Leriche

## 2020-01-20 NOTE — Anesthesia Preprocedure Evaluation (Addendum)
Anesthesia Evaluation  Patient identified by MRN, date of birth, ID band Patient awake    Reviewed: Allergy & Precautions, NPO status , Patient's Chart, lab work & pertinent test results  History of Anesthesia Complications Negative for: history of anesthetic complications  Airway Mallampati: II  TM Distance: >3 FB Neck ROM: Full    Dental  (+) Teeth Intact, Dental Advisory Given   Pulmonary shortness of breath, COPD,  COPD inhaler, former smoker,  Lung cancer   Pulmonary exam normal breath sounds clear to auscultation       Cardiovascular + Peripheral Vascular Disease  Normal cardiovascular exam Rhythm:Regular Rate:Normal     Neuro/Psych negative neurological ROS  negative psych ROS   GI/Hepatic negative GI ROS, Neg liver ROS,   Endo/Other  negative endocrine ROS  Renal/GU negative Renal ROS     Musculoskeletal negative musculoskeletal ROS (+)   Abdominal   Peds  Hematology negative hematology ROS (+)   Anesthesia Other Findings Day of surgery medications reviewed with the patient.  Reproductive/Obstetrics                            Anesthesia Physical Anesthesia Plan  ASA: III  Anesthesia Plan: General   Post-op Pain Management:    Induction: Intravenous  PONV Risk Score and Plan: 3 and Midazolam, Dexamethasone and Ondansetron  Airway Management Planned: Oral ETT  Additional Equipment: Arterial line  Intra-op Plan:   Post-operative Plan: Extubation in OR  Informed Consent: I have reviewed the patients History and Physical, chart, labs and discussed the procedure including the risks, benefits and alternatives for the proposed anesthesia with the patient or authorized representative who has indicated his/her understanding and acceptance.     Dental advisory given  Plan Discussed with: CRNA  Anesthesia Plan Comments:         Anesthesia Quick Evaluation

## 2020-01-20 NOTE — Brief Op Note (Signed)
01/20/2020  12:21 PM  PATIENT:  Lindsay Mason  69 y.o. female  PRE-OPERATIVE DIAGNOSIS:  PULMONARY NODULE RIGHT LOWER LOBE  POST-OPERATIVE DIAGNOSIS:  ADENOCARCINOMA RIGHT LOWER LOBE  PROCEDURES: -XI ROBOTIC ASSISTED THORASCOPY-RIGHT LOWER LOBE WEDGE RESECTION   -RIGHT LOWER LOBECTOMY   -LYMPH NODE DISSECTION  -RIGHT INTERCOSTAL NERVE BLOCK  SURGEON:   Lightfoot, Lucile Crater, MD - Primary    * Icard, Octavio Graves, DO - Resident - Observing  PHYSICIAN ASSISTANT: Remee Charley   ANESTHESIA:   general  EBL:  150 mL   BLOOD ADMINISTERED:none  DRAINS: 5fr right pleural tube x 1   LOCAL MEDICATIONS USED:  Exparel  SPECIMEN:  Source of Specimen:  Right lower lobe wedge resection, right lower lobe  DISPOSITION OF SPECIMEN:  PATHOLOGY  COUNTS:  YES  DICTATION: .Dragon Dictation  PLAN OF CARE: Admit to inpatient   PATIENT DISPOSITION:  PACU - hemodynamically stable.   Delay start of Pharmacological VTE agent (>24hrs) due to surgical blood loss or risk of bleeding: yes

## 2020-01-20 NOTE — H&P (Signed)
[address]  [place] [address]  [phone]  Lindsay Mason  [facility] Medical Record #[id number]  Date of Birth: 11-05-1950  Referring: Ria Bush, MD  Primary Care: Ria Bush, MD  Primary Cardiologist: No primary care provider on file.  No events since clinic appointment OR today for Nav bronch, Right RATS, wedge resection, possible lobectomy  Per my last clinic note    Chief Complaint: No chief complaint on file.   History of Present Illness:  Lindsay Mason 69 y.o. female referred for surgical evaluation of a 1.8 cm right lower lobe pulmonary nodule with an SUV of 2.5. This was originally evaluated on cross-sectional imaging over the course of subsequent screenings there is a new growth with solid component of this lesion. She denies any symptoms from a respiratory standpoint. She remains active and walks, and today without difficulty. She would like to be aggressive proceed with diagnosis and treatment.  Smoking Hx:  She quit smoking in 2008. When she did smoke she never smoked more than 5 cigarettes a day  Zubrod Score:  At the time of surgery this patient's most appropriate activity status/level should be described as:  ? 0 Normal activity, no symptoms  ? 1 Restricted in physical strenuous activity but ambulatory, able to do out light work  ? 2 Ambulatory and capable of self care, unable to do work activities, up and about >50 % of waking hours  ? 3 Only limited self care, in bed greater than 50% of waking hours  ? 4 Completely disabled, no self care, confined to bed or chair  ? 5 Moribund      Past Medical History:  Diagnosis Date  . 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 Allergy   . CAP (community acquired pneumonia) 12/2011  . Chest pain    normal ETT 10/2011  . 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 Ex-smoker 08/2014   currently using e cig  . Microscopic colitis 06/2015   lymphocytic by colonoscopy - started entocort [name]  . Osteoporosis, unspecified   . Scoliosis of lumbar spine   . 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 COLONOSCOPY  06/2015   microscopic colitis [name]  . DEXA  10/2008   Osteoporosis  . DEXA  02/2014   T score osteopenia hip, osteoporosis spine, scoliosis  . ETT  10/2011   WNL, no evidence of ischemia, excellent exercise tolerance  . EXPLORATORY LAPAROTOMY    . MRI head  07/2011   multiple foci deep and subcortical white matter, chronic ischemic vs demyelinating, no active disease  . TONSILLECTOMY AND ADENOIDECTOMY    . Vein removal     removed from legs        Family History  Problem Relation Age of Onset  . Osteoporosis Other    great grandmother  . Stroke Maternal Grandmother   . Coronary artery disease Maternal Grandfather 50  . Diabetes Maternal Grandfather   . Coronary artery disease Paternal Grandmother 80  . Coronary artery disease Paternal Grandfather 59  . Lung cancer Maternal Aunt    Social History       Tobacco Use  Smoking Status Former Smoker  . Packs/day: 1.00  . Years: 30.00  . Pack years: 30.00  . Types: Cigarettes  . Start date: 10/14/1978  . Quit  date: 07/15/2015  . Years since quitting: 4.4  Smokeless Tobacco Never Used  Tobacco Comment   off and on smoker, now e-cigarettes   Social History       Substance and Sexual Activity  Alcohol Use Yes  . Alcohol/week: 0.0 standard drinks   Comment: social wine once a week        Allergies  Allergen Reactions  . Penicillins     REACTION: swelling tongue - States has taken Cephalosporins without difficulty in past         Current Outpatient Medications  Medication Sig Dispense Refill  . aspirin EC 81 MG tablet Take 81 mg by mouth daily.     . B Complex-Biotin-FA (B-COMPLEX PO) Take 1 tablet by mouth daily.    . Calcium Carbonate-Vitamin D (CALCIUM 600+D) 600-200  MG-UNIT TABS Take 1 tablet by mouth 2 (two) times daily.     . Multiple Vitamin (MULTIVITAMIN) tablet Take 1 tablet by mouth daily.     Marland Kitchen OVER THE COUNTER MEDICATION Take 1 capsule by mouth daily. SEROVITAL [place]    . risedronate (ACTONEL) 5 MG tablet Take 5 mg by mouth daily before breakfast. with water on empty stomach, nothing by mouth or lie down for next 30 minutes.    . VENTOLIN HFA 108 (90 Base) MCG/ACT inhaler TAKE 2 PUFFS BY MOUTH EVERY 6 HOURS AS NEEDED FOR WHEEZE OR SHORTNESS OF BREATH 18 g 3  . vitamin B-12 (CYANOCOBALAMIN) 100 MCG tablet Take 100 mcg by mouth daily.      No current facility-administered medications for this visit.   Review of Systems  Constitutional: Negative.  Respiratory: Negative.  Cardiovascular: Negative.  Musculoskeletal: Negative.  Neurological: Negative.   PHYSICAL EXAMINATION:  There were no vitals taken for this visit.  Physical Exam  Constitutional: She is oriented to person, place, and time. She appears well-developed and well-nourished. No distress.  HENT:  Head: Normocephalic and atraumatic.  Eyes: Conjunctivae and EOM are normal.  Neck: No tracheal deviation present.  Cardiovascular: Normal rate, regular rhythm and normal heart sounds.  No murmur heard.  Respiratory: Effort normal and breath sounds normal. No respiratory distress.  GI: She exhibits no distension.  Musculoskeletal:  General: Normal range of motion.  Cervical back: Normal range of motion.  Neurological: She is alert and oriented to person, place, and time.  Skin: Skin is warm and dry. She is not diaphoretic.   Diagnostic Studies & Laboratory data:  Recent Radiology Findings:  Imaging Results    I have independently reviewed the above radiology studies and reviewed the findings with the patient.  Recent Lab Findings:  Recent Labs                                                                                                                               PFTs:  - FVC: 96%  - FEV1: 83%  -DLCO: 97%  Problem List:  1.8 cm right lower lobe  nodule. Concerning for primary lung cancer  Assessment / Plan:  69 year old female with a 1.8 cm right lower lobe nodule concerning for primary lung cancer. It has only mildly avid on PET, but given her history of smoking I do think that a biopsy would be recommended. I personally reviewed her CT scan and PET/CT. We reviewed several options for biopsy which included a CT-guided biopsy versus navigational bronchoscopy versus surgical biopsy. Based on her CT scan, she would be an ideal candidate for navigational bronchoscopic marking followed wedge resection as combined procedure. I will discuss this with Dr. Valeta Harms. The surgical biopsy is positive for primary lung cancer she will require lobectomy. She will require cardiac work-up prior to the procedure. She is tentatively scheduled for navigational bronchoscopy marking with Dr. Valeta Harms, and a robotic assisted right thoracoscopy with right lower lobe wedge and possible lobectomy with me in the next 2 weeks.    Kenya Shiraishi Bary Leriche

## 2020-01-20 NOTE — Plan of Care (Signed)
  Problem: Education: Goal: Knowledge of General Education information will improve Description: Including pain rating scale, medication(s)/side effects and non-pharmacologic comfort measures Outcome: Progressing   Problem: Health Behavior/Discharge Planning: Goal: Ability to manage health-related needs will improve Outcome: Progressing   Problem: Clinical Measurements: Goal: Will remain free from infection Outcome: Progressing Goal: Respiratory complications will improve Outcome: Progressing   Problem: Activity: Goal: Risk for activity intolerance will decrease Outcome: Progressing   Problem: Coping: Goal: Level of anxiety will decrease Outcome: Progressing

## 2020-01-20 NOTE — Anesthesia Procedure Notes (Signed)
Procedure Name: Intubation Date/Time: 01/20/2020 7:31 AM Performed by: Candis Shine, CRNA Pre-anesthesia Checklist: Patient identified, Emergency Drugs available, Suction available and Patient being monitored Patient Re-evaluated:Patient Re-evaluated prior to induction Oxygen Delivery Method: Circle System Utilized Preoxygenation: Pre-oxygenation with 100% oxygen Induction Type: IV induction Ventilation: Mask ventilation without difficulty Laryngoscope Size: Mac and 3 Grade View: Grade I Tube type: Oral Tube size: 8.5 mm Number of attempts: 1 Airway Equipment and Method: Stylet Placement Confirmation: ETT inserted through vocal cords under direct vision,  positive ETCO2 and breath sounds checked- equal and bilateral Secured at: 22 cm Tube secured with: Tape Dental Injury: Teeth and Oropharynx as per pre-operative assessment  Comments: Intubation by Fredrik Cove.

## 2020-01-20 NOTE — Transfer of Care (Signed)
Immediate Anesthesia Transfer of Care Note  Patient: Lindsay Mason  Procedure(s) Performed: VIDEO BRONCHOSCOPY WITH FLUORO ELECTROMAGNETIC NAVIGATION BRONCHOSCOPY ICG DYE for ROBOTIC VATS XI ROBOTIC ASSISTED THORASCOPY-RIGHT LOWER LOBE WEDGE RESECTION, RIGHT LOWER LOBECTOMY (Right Chest) Node Dissection (Chest) Intercostal Nerve Block (Right Chest)  Patient Location: PACU  Anesthesia Type:General  Level of Consciousness: drowsy  Airway & Oxygen Therapy: Patient Spontanous Breathing and Patient connected to face mask oxygen  Post-op Assessment: Report given to RN and Post -op Vital signs reviewed and stable  Post vital signs: Reviewed  Last Vitals:  Vitals Value Taken Time  BP    Temp    Pulse    Resp    SpO2      Last Pain:  Vitals:   01/20/20 0629  TempSrc:   PainSc: 0-No pain         Complications: No apparent anesthesia complications

## 2020-01-20 NOTE — Consult Note (Signed)
NAME:  Lindsay Mason, MRN:  [id number], DOB:  July 05, 1951, LOS: 0 ADMISSION DATE:  01/20/2020, CONSULTATION DATE: 01/20/2020 REFERRING MD: Dr. Kipp Brood, CHIEF COMPLAINT: Lung nodule  History of present illness   This is a 69 year old female evaluation of a 1.8 cm right lower lobe pulmonary nodule with an SUV of 2.5.  Patient is a former smoker.  She was followed in the lung cancer screening program.  She was referred to cardiothoracic surgery for evaluation of robotic VATS.  Decision was made for surgical resection of nodule due to its risk of malignancy.  Patient presents today for robotic VATS to the hospital with prior electromagnetic navigational bronchoscopy and dye marking with ICG and methylene blue.  Past Medical History   Past Medical History:  Diagnosis Date  . Adrenal hyperplasia ([place]) 12/2011   on CT  . Allergy   . CAP (community acquired pneumonia) 12/2011  . Chest pain    normal ETT 10/2011  . 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 Ex-smoker 08/2014   currently using e cig  . Microscopic colitis 06/2015   lymphocytic by colonoscopy - started entocort [name]  . Osteoporosis, unspecified   . Scoliosis of lumbar spine   . 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 rate 18, height 5' 2.5" (1.588 m), weight 63.5 kg, SpO2 100 %.       No intake or output data in the 24 hours ending 01/20/20 0715 Filed Weights   01/20/20 0602  Weight: 63.5 kg    Examination: General: Female, comfortable resting in bed no distress HENT: Acting appropriately NCAT Lungs: Clear to auscultation bilaterally no crackles no wheeze Cardiovascular: Regular rate rhythm S1-S2 Abdomen: Soft nontender nondistended Extremities: Right radial arterial line Neuro: Alert oriented following commands no focal deficit GU:  Deferred  Assessment & Plan:   1.8 cm right lower lobe pulmonary nodule, SUV 2.5 Former smoker, history of tobacco abuse Plan: Electromagnetic navigational bronchoscopy with ICG and methylene blue dye marking Plans for robotic VATS following bronchoscopy is a transition from endoscopy to the operating room. Further management per cardiothoracic surgery Dr. Kipp Brood. We discussed the details of the procedure and preop. We discussed the risk benefits and alternatives.  Patient is agreeable to proceed with no barriers.  We appreciate the consultation.   Labs   CBC: Recent Labs  Lab 01/18/20 1220  WBC 5.0  HGB 14.3  HCT 45.5  MCV 100.0  PLT 025    Basic Metabolic Panel: Recent Labs  Lab 01/18/20 1220  NA 140  K 4.1  CL 108  CO2 21*  GLUCOSE 94  BUN 16  CREATININE 0.66  CALCIUM 8.9   GFR: Estimated Creatinine Clearance: 59.7 mL/min (by C-G formula based on SCr of 0.66 mg/dL). Recent Labs  Lab 01/18/20 1220  WBC 5.0    Liver Function Tests: Recent Labs  Lab 01/18/20 1220  AST 20  ALT 16  ALKPHOS 84  BILITOT 0.9  PROT 5.9*  ALBUMIN 3.8   No results for input(s): LIPASE, AMYLASE in the last 168 hours. No results for input(s): AMMONIA in the last 168 hours.  ABG    Component Value Date/Time   PHART 7.427 01/18/2020 1234   PCO2ART 37.1 01/18/2020 1234   PO2ART 85.5 01/18/2020 1234   HCO3 24.0 01/18/2020 1234  TCO2 27 12/30/2011 2136   O2SAT 96.9 01/18/2020 1234     Coagulation Profile: Recent Labs  Lab 01/18/20 1220  INR 1.0    Cardiac Enzymes: No results for input(s): CKTOTAL, CKMB, CKMBINDEX, TROPONINI in the last 168 hours.  HbA1C: No results found for: HGBA1C  CBG: No results for input(s): GLUCAP in the last 168 hours.  Review of Systems:   Review of Systems  Constitutional: Negative for chills, fever, malaise/fatigue and weight loss.  HENT: Negative for hearing loss, sore throat and tinnitus.   Eyes: Negative for blurred  vision and double vision.  Respiratory: Negative for cough, hemoptysis, sputum production, shortness of breath, wheezing and stridor.   Cardiovascular: Negative for chest pain, palpitations, orthopnea, leg swelling and PND.  Gastrointestinal: Negative for abdominal pain, constipation, diarrhea, heartburn, nausea and vomiting.  Genitourinary: Negative for dysuria, hematuria and urgency.  Musculoskeletal: Negative for joint pain and myalgias.  Skin: Negative for itching and rash.  Neurological: Negative for dizziness, tingling, weakness and headaches.  Endo/Heme/Allergies: Negative for environmental allergies. Does not bruise/bleed easily.  Psychiatric/Behavioral: Negative for depression. The patient is nervous/anxious. The patient does not have insomnia.   All other systems reviewed and are negative.    Past Medical History  She,  has a past medical history of Adrenal hyperplasia ([place]) (12/2011), Allergy, CAP (community acquired pneumonia) (12/2011), Chest pain, COPD with emphysema ([place]) (02/2016), Ex-smoker (08/2014), Microscopic colitis (06/2015), Osteoporosis, unspecified, Scoliosis of lumbar spine, and Thoracic aortic atherosclerosis ([place]) (02/2016).   Surgical History    Past Surgical History:  Procedure Laterality Date  . COLONOSCOPY  06/2015   microscopic colitis [name]  . DEXA  10/2008   Osteoporosis  . DEXA  02/2014   T score osteopenia hip, osteoporosis spine, scoliosis  . ETT  10/2011   WNL, no evidence of ischemia, excellent exercise tolerance  . EXPLORATORY LAPAROTOMY    . MRI head  07/2011   multiple foci deep and subcortical white matter, chronic ischemic vs demyelinating, no active disease  . TONSILLECTOMY AND ADENOIDECTOMY    . Vein removal     removed from legs     Social History   reports that she quit smoking about 4 years ago. Her smoking use included cigarettes. She started smoking about 41 years ago. She has a 30.00 pack-year smoking history. She has never used  smokeless tobacco. She reports current alcohol use. She reports that she does not use drugs.   Family History   Her family history includes Coronary artery disease (age of onset: 50) in her maternal grandfather; Coronary artery disease (age of onset: 43) in her paternal grandfather and paternal grandmother; Diabetes in her maternal grandfather; Lung cancer in her maternal aunt; Osteoporosis in an other family member; Stroke in her maternal grandmother.   Allergies Allergies  Allergen Reactions  . Penicillins Swelling    REACTION: swelling tongue - States has taken Cephalosporins without difficulty in past Did it involve swelling of the face/tongue/throat, SOB, or low BP? Yes Did it involve sudden or severe rash/hives, skin peeling, or any reaction on the inside of your mouth or nose? No Did you need to seek medical attention at a hospital or doctor's office? Yes When did it last happen? Childhood reaction If all above answers are "NO", may proceed with cephalosporin use.      Home Medications  Prior to Admission medications   Medication Sig Start Date End Date Taking? Authorizing Provider  Calcium Carb-Cholecalciferol (CALCIUM + D3 PO) Take 1 tablet  by mouth daily.   Yes [provider]  ciprofloxacin (CIPRO) 500 MG tablet Take 1 tablet (500 mg total) by mouth 2 (two) times daily for 3 days. 01/18/20 01/21/20 Yes Lightfoot, Lucile Crater, MD  OVER THE COUNTER MEDICATION Take 1 capsule by mouth daily. SEROVITAL [place]   Yes [provider]  Polyethyl Glycol-Propyl Glycol (LUBRICANT EYE DROPS) 0.4-0.3 % SOLN Place 1 drop into both eyes 3 (three) times daily as needed (dry/irritated eyes.).   Yes [provider]  VENTOLIN HFA 108 (90 Base) MCG/ACT inhaler TAKE 2 PUFFS BY MOUTH EVERY 6 HOURS AS NEEDED FOR WHEEZE OR SHORTNESS OF BREATH Patient taking differently: Inhale 2 puffs into the lungs every 6 (six) hours as needed (wheezing/shortness of breath.).  08/30/19  Yes Ria Bush, MD    [name], DO [facility] 01/20/2020 7:20 AM

## 2020-01-20 NOTE — Anesthesia Postprocedure Evaluation (Signed)
Anesthesia Post Note  Patient: Lindsay Mason  Procedure(s) Performed: VIDEO BRONCHOSCOPY WITH FLUORO ELECTROMAGNETIC NAVIGATION BRONCHOSCOPY ICG DYE for ROBOTIC VATS XI ROBOTIC ASSISTED THORASCOPY-RIGHT LOWER LOBE WEDGE RESECTION, RIGHT LOWER LOBECTOMY (Right Chest) Node Dissection (Chest) Intercostal Nerve Block (Right Chest)     Patient location during evaluation: PACU Anesthesia Type: General Level of consciousness: awake and alert Pain management: pain level controlled Vital Signs Assessment: post-procedure vital signs reviewed and stable Respiratory status: spontaneous breathing, nonlabored ventilation, respiratory function stable and patient connected to nasal cannula oxygen Cardiovascular status: blood pressure returned to baseline and stable Postop Assessment: no apparent nausea or vomiting Anesthetic complications: no    Last Vitals:  Vitals:   01/20/20 1430 01/20/20 1500  BP: 128/82 127/80  Pulse: 68 63  Resp: 20 16  Temp:  36.5 C  SpO2: 96% 95%    Last Pain:  Vitals:   01/20/20 1500  TempSrc:   PainSc: [place] D Demetris Meinhardt

## 2020-01-20 NOTE — H&P (View-Only) (Signed)
NAME:  Lindsay Mason, MRN:  [id number], DOB:  Aug 30, 1951, LOS: 0 ADMISSION DATE:  01/20/2020, CONSULTATION DATE: 01/20/2020 REFERRING MD: Dr. Kipp Brood, CHIEF COMPLAINT: Lung nodule  History of present illness   This is a 69 year old female evaluation of a 1.8 cm right lower lobe pulmonary nodule with an SUV of 2.5.  Patient is a former smoker.  She was followed in the lung cancer screening program.  She was referred to cardiothoracic surgery for evaluation of robotic VATS.  Decision was made for surgical resection of nodule due to its risk of malignancy.  Patient presents today for robotic VATS to the hospital with prior electromagnetic navigational bronchoscopy and dye marking with ICG and methylene blue.  Past Medical History   Past Medical History:  Diagnosis Date  . Adrenal hyperplasia ([place]) 12/2011   on CT  . Allergy   . CAP (community acquired pneumonia) 12/2011  . Chest pain    normal ETT 10/2011  . 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 Ex-smoker 08/2014   currently using e cig  . Microscopic colitis 06/2015   lymphocytic by colonoscopy - started entocort [name]  . Osteoporosis, unspecified   . Scoliosis of lumbar spine   . 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 rate 18, height 5' 2.5" (1.588 m), weight 63.5 kg, SpO2 100 %.       No intake or output data in the 24 hours ending 01/20/20 0715 Filed Weights   01/20/20 0602  Weight: 63.5 kg    Examination: General: Female, comfortable resting in bed no distress HENT: Acting appropriately NCAT Lungs: Clear to auscultation bilaterally no crackles no wheeze Cardiovascular: Regular rate rhythm S1-S2 Abdomen: Soft nontender nondistended Extremities: Right radial arterial line Neuro: Alert oriented following commands no focal deficit GU:  Deferred  Assessment & Plan:   1.8 cm right lower lobe pulmonary nodule, SUV 2.5 Former smoker, history of tobacco abuse Plan: Electromagnetic navigational bronchoscopy with ICG and methylene blue dye marking Plans for robotic VATS following bronchoscopy is a transition from endoscopy to the operating room. Further management per cardiothoracic surgery Dr. Kipp Brood. We discussed the details of the procedure and preop. We discussed the risk benefits and alternatives.  Patient is agreeable to proceed with no barriers.  We appreciate the consultation.   Labs   CBC: Recent Labs  Lab 01/18/20 1220  WBC 5.0  HGB 14.3  HCT 45.5  MCV 100.0  PLT 253    Basic Metabolic Panel: Recent Labs  Lab 01/18/20 1220  NA 140  K 4.1  CL 108  CO2 21*  GLUCOSE 94  BUN 16  CREATININE 0.66  CALCIUM 8.9   GFR: Estimated Creatinine Clearance: 59.7 mL/min (by C-G formula based on SCr of 0.66 mg/dL). Recent Labs  Lab 01/18/20 1220  WBC 5.0    Liver Function Tests: Recent Labs  Lab 01/18/20 1220  AST 20  ALT 16  ALKPHOS 84  BILITOT 0.9  PROT 5.9*  ALBUMIN 3.8   No results for input(s): LIPASE, AMYLASE in the last 168 hours. No results for input(s): AMMONIA in the last 168 hours.  ABG    Component Value Date/Time   PHART 7.427 01/18/2020 1234   PCO2ART 37.1 01/18/2020 1234   PO2ART 85.5 01/18/2020 1234   HCO3 24.0 01/18/2020 1234  TCO2 27 12/30/2011 2136   O2SAT 96.9 01/18/2020 1234     Coagulation Profile: Recent Labs  Lab 01/18/20 1220  INR 1.0    Cardiac Enzymes: No results for input(s): CKTOTAL, CKMB, CKMBINDEX, TROPONINI in the last 168 hours.  HbA1C: No results found for: HGBA1C  CBG: No results for input(s): GLUCAP in the last 168 hours.  Review of Systems:   Review of Systems  Constitutional: Negative for chills, fever, malaise/fatigue and weight loss.  HENT: Negative for hearing loss, sore throat and tinnitus.   Eyes: Negative for blurred  vision and double vision.  Respiratory: Negative for cough, hemoptysis, sputum production, shortness of breath, wheezing and stridor.   Cardiovascular: Negative for chest pain, palpitations, orthopnea, leg swelling and PND.  Gastrointestinal: Negative for abdominal pain, constipation, diarrhea, heartburn, nausea and vomiting.  Genitourinary: Negative for dysuria, hematuria and urgency.  Musculoskeletal: Negative for joint pain and myalgias.  Skin: Negative for itching and rash.  Neurological: Negative for dizziness, tingling, weakness and headaches.  Endo/Heme/Allergies: Negative for environmental allergies. Does not bruise/bleed easily.  Psychiatric/Behavioral: Negative for depression. The patient is nervous/anxious. The patient does not have insomnia.   All other systems reviewed and are negative.    Past Medical History  She,  has a past medical history of Adrenal hyperplasia ([place]) (12/2011), Allergy, CAP (community acquired pneumonia) (12/2011), Chest pain, COPD with emphysema ([place]) (02/2016), Ex-smoker (08/2014), Microscopic colitis (06/2015), Osteoporosis, unspecified, Scoliosis of lumbar spine, and Thoracic aortic atherosclerosis ([place]) (02/2016).   Surgical History    Past Surgical History:  Procedure Laterality Date  . COLONOSCOPY  06/2015   microscopic colitis [name]  . DEXA  10/2008   Osteoporosis  . DEXA  02/2014   T score osteopenia hip, osteoporosis spine, scoliosis  . ETT  10/2011   WNL, no evidence of ischemia, excellent exercise tolerance  . EXPLORATORY LAPAROTOMY    . MRI head  07/2011   multiple foci deep and subcortical white matter, chronic ischemic vs demyelinating, no active disease  . TONSILLECTOMY AND ADENOIDECTOMY    . Vein removal     removed from legs     Social History   reports that she quit smoking about 4 years ago. Her smoking use included cigarettes. She started smoking about 41 years ago. She has a 30.00 pack-year smoking history. She has never used  smokeless tobacco. She reports current alcohol use. She reports that she does not use drugs.   Family History   Her family history includes Coronary artery disease (age of onset: 23) in her maternal grandfather; Coronary artery disease (age of onset: 53) in her paternal grandfather and paternal grandmother; Diabetes in her maternal grandfather; Lung cancer in her maternal aunt; Osteoporosis in an other family member; Stroke in her maternal grandmother.   Allergies Allergies  Allergen Reactions  . Penicillins Swelling    REACTION: swelling tongue - States has taken Cephalosporins without difficulty in past Did it involve swelling of the face/tongue/throat, SOB, or low BP? Yes Did it involve sudden or severe rash/hives, skin peeling, or any reaction on the inside of your mouth or nose? No Did you need to seek medical attention at a hospital or doctor's office? Yes When did it last happen? Childhood reaction If all above answers are "NO", may proceed with cephalosporin use.      Home Medications  Prior to Admission medications   Medication Sig Start Date End Date Taking? Authorizing Provider  Calcium Carb-Cholecalciferol (CALCIUM + D3 PO) Take 1 tablet  by mouth daily.   Yes [provider]  ciprofloxacin (CIPRO) 500 MG tablet Take 1 tablet (500 mg total) by mouth 2 (two) times daily for 3 days. 01/18/20 01/21/20 Yes Lightfoot, Lucile Crater, MD  OVER THE COUNTER MEDICATION Take 1 capsule by mouth daily. SEROVITAL [place]   Yes [provider]  Polyethyl Glycol-Propyl Glycol (LUBRICANT EYE DROPS) 0.4-0.3 % SOLN Place 1 drop into both eyes 3 (three) times daily as needed (dry/irritated eyes.).   Yes [provider]  VENTOLIN HFA 108 (90 Base) MCG/ACT inhaler TAKE 2 PUFFS BY MOUTH EVERY 6 HOURS AS NEEDED FOR WHEEZE OR SHORTNESS OF BREATH Patient taking differently: Inhale 2 puffs into the lungs every 6 (six) hours as needed (wheezing/shortness of breath.).  08/30/19  Yes Ria Bush, MD    [name], DO [facility] 01/20/2020 7:20 AM

## 2020-01-20 NOTE — Anesthesia Procedure Notes (Signed)
Procedure Name: Intubation Date/Time: 01/20/2020 8:13 AM Performed by: Candis Shine, CRNA Pre-anesthesia Checklist: Patient identified, Emergency Drugs available, Suction available and Patient being monitored Patient Re-evaluated:Patient Re-evaluated prior to induction Oxygen Delivery Method: Circle System Utilized Preoxygenation: Pre-oxygenation with 100% oxygen Induction Type: IV induction Laryngoscope Size: Mac and 3 Grade View: Grade I Tube type: Oral Endobronchial tube: Left, Double lumen EBT and EBT position confirmed by auscultation and 35 Fr Number of attempts: 1 Airway Equipment and Method: Stylet Placement Confirmation: ETT inserted through vocal cords under direct vision,  positive ETCO2 and breath sounds checked- equal and bilateral Tube secured with: Tape Dental Injury: Teeth and Oropharynx as per pre-operative assessment  Comments: Intubation by Fredrik Cove. 35 Fr Vivasight DLT placed without difficulty.

## 2020-01-21 ENCOUNTER — Inpatient Hospital Stay (HOSPITAL_COMMUNITY): Payer: Medicare HMO

## 2020-01-21 LAB — SURGICAL PATHOLOGY

## 2020-01-21 LAB — BASIC METABOLIC PANEL
Anion gap: 8 (ref 5–15)
BUN: 12 mg/dL (ref 8–23)
CO2: 27 mmol/L (ref 22–32)
Calcium: 8.6 mg/dL — ABNORMAL LOW (ref 8.9–10.3)
Chloride: 101 mmol/L (ref 98–111)
Creatinine, Ser: 0.67 mg/dL (ref 0.44–1.00)
GFR calc Af Amer: >60 mL/min (ref >60)
GFR calc non Af Amer: >60 mL/min (ref >60)
Glucose, Bld: 119 mg/dL — ABNORMAL HIGH (ref 70–99)
Potassium: 3.8 mmol/L (ref 3.5–5.1)
Sodium: 136 mmol/L (ref 135–145)

## 2020-01-21 LAB — CBC
HCT: 35 % — ABNORMAL LOW (ref 36.0–46.0)
Hemoglobin: 11.6 g/dL — ABNORMAL LOW (ref 12.0–15.0)
MCH: 31.6 pg (ref 26.0–34.0)
MCHC: 33.1 g/dL (ref 30.0–36.0)
MCV: 95.4 fL (ref 80.0–100.0)
Platelets: 164 10*3/uL (ref 150–400)
RBC: 3.67 MIL/uL — ABNORMAL LOW (ref 3.87–5.11)
RDW: 13 % (ref 11.5–15.5)
WBC: 9.2 10*3/uL (ref 4.0–10.5)
nRBC: 0 % (ref 0.0–0.2)

## 2020-01-21 NOTE — Discharge Summary (Signed)
Physician Discharge Summary  Patient ID: Lindsay Mason MRN: [id number] DOB/AGE: [birth year] 69 y.o.  Admit date: 01/20/2020 Discharge date: 01/22/2020  Admission Diagnoses: Pulmonary nodule right lower lobe Former tobacco smoker COPD  Discharge Diagnoses:   Pulmonary nodule right lower lobe Former tobacco smoker COPD S/P lobectomy of lung   Discharged Condition: stable  History of Present Illness:  Lindsay Mason 69 y.o. female referred for surgical evaluation of a 1.8 cm right lower lobe pulmonary nodule with an SUV of 2.5. This was originally evaluated on cross-sectional imaging over the course of subsequent screenings there is a new growth with solid component of this lesion. She denies any symptoms from a respiratory standpoint. She remains active and walks, and today without difficulty. She would like to be aggressive and proceed with diagnosis and treatment.  Smoking Hx:  She quit smoking in 2008. When she did smoke, she never smoked more than 5 cigarettes a day   Hospital Course:  Lindsay Mason was admitted for elective surgery on 01/20/20 and taken to the OR where right robot-assisted lower lobe wedge resection was performed with the specimen confirming adenocarcinoma. Dr. Kipp Brood proceeded with right lower lobectomy with mediastinal lymph node sampling.  Following the procedure, she was extubated and recoveed in the PACU.  The chest tube did not demonstrate an air leak immediately following surgery so it was placed to water seal . Follow up CXR on POD 1 showed a trace apical pneumothorax and there was moderate serosanguinous drainage from the CT overnight following surgery.  The chest tube was removed and follow-up chest x-ray showed a slightly larger apical lateral airspace on the right.  Lindsay Mason also had an episode of nausea with vomiting on the first postoperative day after resuming a clear liquid diet.  For these reasons, she was kept in the hospital overnight.  Her  abdominal discomfort resolved and she was able to resume regular diet without any difficulty.  Respiratory status remained stable.  Follow-up chest x-ray on postop day 2 showed the apical lateral airspace to be stable.  There had developed in the interval, however, a small basilar air-fluid level.  She was clinically stable and maintaining excellent oxygen saturation on room air and had no significant pain.  She was discharged home in stable condition.  Pain control is adequate with oral analgesics.  She will have follow up in the office with a chest x-ray next week.  Consults: None  Significant Diagnostic Studies:    CT CHEST WITHOUT CONTRAST FOR LUNG CANCER SCREENING NODULE FOLLOW-UP  TECHNIQUE: Multidetector CT imaging of the chest was performed following the standard protocol without IV contrast.  COMPARISON:  Low-dose lung cancer screening CT chest dated 05/26/2019  FINDINGS: Cardiovascular: The heart is normal in size. No pericardial effusion.  No evidence of thoracic aortic aneurysm. Mild atherosclerotic calcifications of the aortic arch.  Mediastinum/Nodes: No suspicious mediastinal lymphadenopathy.  Visualized thyroid is unremarkable.  Lungs/Pleura: Biapical pleural-parenchymal scarring.  Mild centrilobular and paraseptal emphysematous changes, upper lung predominant.  No focal consolidation.  Scattered small subpleural bilateral pulmonary nodules, measuring up to 3.5 mm in the superior segment right lower lobe, unchanged, benign.  Progressive subsolid posterior right lower lobe nodule, measuring 18.3 mm overall (unchanged), but now with a 9.4 mm central solid component (previously 4.2 mm), highly suspicious for invasive adenocarcinoma.  No pleural effusion or pneumothorax.  Upper Abdomen: Visualized upper abdomen is grossly unremarkable.  Musculoskeletal: Degenerative changes of the upper lumbar spine.  IMPRESSION: Lung-RADS 4B, suspicious.  Additional imaging evaluation or consultation with Pulmonology or Thoracic Surgery recommended.  Progressive subsolid posterior right lower lobe nodule, now with 9 mm central solid component, highly suspicious for invasive adenocarcinoma. Discussion at multidisciplinary tumor board is suggested. Consider PET-CT and/or tissue sampling, as clinically warranted.  Aortic Atherosclerosis (ICD10-I70.0) and Emphysema (ICD10-J43.9).   Electronically Signed   By: Julian Hy M.D.   On: 12/20/2019 08:47   NUCLEAR MEDICINE PET SKULL BASE TO THIGH  TECHNIQUE: 6.7 mCi F-18 FDG was injected intravenously. Full-ring PET imaging was performed from the skull base to thigh after the radiotracer. CT data was obtained and used for attenuation correction and anatomic localization.  Fasting blood glucose: 100 mg/dl  COMPARISON:  Chest CT 12/16/2019  FINDINGS: Mediastinal blood pool activity: SUV max 1.74  Liver activity: SUV max NA  NECK: No hypermetabolic lymph nodes in the neck.  Incidental CT findings: none  CHEST: Semi-solid nodular lesion in the superior segment of the right lower lobe with a small associated solid nodular component is again demonstrated. This is mildly hypermetabolic with SUV max of 4.54. This is certainly suspicious for a low-grade adenocarcinoma.  No other pulmonary lesions are identified. No enlarged or hypermetabolic mediastinal or hilar lymph nodes. No breast masses, supraclavicular or axillary adenopathy.  Incidental CT findings: Underlying emphysematous changes and pulmonary scarring again demonstrated. No acute pulmonary findings.  ABDOMEN/PELVIS: No abnormal hypermetabolic activity within the liver, pancreas, adrenal glands, or spleen. No hypermetabolic lymph nodes in the abdomen or pelvis.  Incidental CT findings: Moderate tortuosity and age advanced atherosclerotic calcifications involving the abdominal aorta and iliac  arteries. No aneurysm.  Multiple small cyst associated with the right ovary. Hydrosalpinx would be another possibility.  SKELETON: No hypermetabolic bone lesions to suggest metastatic disease.  Incidental CT findings: Healed left lower rib fractures are noted. Significant scoliosis and degenerative lumbar spondylosis with multilevel disc disease and facet disease.  Remote healed left-sided pubic rami fractures.  Diffuse FDG uptake is noted in the left gluteal, left adductor and left piriformis muscles likely related to recent trauma with muscle injuries.  IMPRESSION: 1. Superior segment right lower lobe lesion is mildly hypermetabolic and most consistent with a low-grade adenocarcinoma. 2. No findings for mediastinal or hilar disease or metastatic disease elsewhere. 3. Stable underlying emphysematous changes and pulmonary scarring. 4. Moderate age advanced atherosclerotic calcifications. 5. Diffuse FDG uptake in the left pelvic muscles, likely related to recent trauma with muscle injuries. 6. Cystic structures associated with the right ovary likely benign cysts or hydrosalpinx.   Electronically Signed   By: Marijo Sanes M.D.   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 Procedure: - Robotic assisted right video thoracoscopy -Right lower lobe wedge resection -Right lower lobectomy - Mediastinal lymph node sampling - Intercostal nerve block  Surgeon and Role:      * Lightfoot, Lucile Crater, MD - Primary    *Dr. Pia Mau, MD- assisting Assistant: Macarthur Critchley, PA-C  Anesthesia  general EBL: 200 ml Blood Administration: None Specimen: Right lower lobe wedge resection, right lower lobe, station 7 lymph nodes, station 4 lymph nodes, station 12 months  Drains: 28 F argyle chest tube in right chest Counts:  correct   Indications: 69 year old female with a 1.8 cm right lower lobe nodule concerning for primary lung cancer. It has only mildly avid on PET, but given her history of  smoking I do think that a biopsy would be recommended. I personally reviewed her CT scan and PET/CT. We reviewed several options for biopsy which included a CT-guided biopsy versus navigational bronchoscopy versus surgical biopsy. Based on her CT scan, she would be an ideal candidate for navigational bronchoscopic marking followed wedge resection as combined procedure. I will discuss this with Dr. Valeta Harms. The surgical biopsy is positive for primary lung cancer she will require lobectomy.  Findings: The ICG and methylene blue marking localized a pleural abnormality.  Generous wedge resection was performed and the biopsy showed adenocarcinoma.  During isolation of right lower lobe pulmonary artery, bleeding was encountered.  Pressure was held but isolate the artery more proximally.  Only 150 mils of blood was lost, and she remained hemodynamically stable throughout.  Discharge Exam: Blood pressure 131/81, pulse 76, temperature 99.2 F (37.3 C), temperature source Oral, resp. rate (!) 21, height 5' 2.5" (1.588 m), weight 63.5 kg, SpO2 100 %.  Heart: regular rate and rhythm Lungs: clear to auscultation bilaterally Wound: All incisions are intact and dry. No palpable subQ air. in right chest or neck.  Disposition:    Allergies as of 01/22/2020      Reactions   Penicillins Swelling   REACTION: swelling tongue - States has taken Cephalosporins without difficulty in past Did it involve swelling of the face/tongue/throat, SOB, or low BP? Yes Did it involve sudden or severe rash/hives, skin peeling, or any reaction on the inside of your mouth or nose? No Did you need to seek medical attention at a hospital or doctor's office? Yes When did it last happen? Childhood reaction If all above answers are "NO", may proceed with  cephalosporin use.      Medication List    STOP taking these medications   ciprofloxacin 500 MG tablet Commonly known as: Cipro     TAKE these medications   acetaminophen 325 MG tablet Commonly known as: Tylenol Take 2 tablets (650 mg total) by mouth every 4 (four) hours as needed for moderate pain.   CALCIUM + D3 PO Take 1 tablet by mouth daily.   Lubricant Eye Drops 0.4-0.3 % Soln Generic drug: Polyethyl Glycol-Propyl Glycol Place 1 drop into both eyes 3 (three) times daily as needed (dry/irritated eyes.).   OVER THE COUNTER MEDICATION Take 1 capsule by mouth daily. SEROVITAL HGH   traMADol 50 MG tablet Commonly known as: ULTRAM Take 1 tablet (50 mg total) by mouth every 6 (six) hours as needed for up to 7 days (mild pain).   Ventolin HFA 108 (90 Base) MCG/ACT inhaler Generic drug: albuterol TAKE 2 PUFFS BY MOUTH EVERY 6 HOURS AS NEEDED FOR WHEEZE OR SHORTNESS OF BREATH What changed: See the new instructions.      Follow-up Information    [name], MD. Go on 01/28/2020.   Specialty: Cardiothoracic Surgery Why: You have and appointment with Dr. Kipp Brood on Friday, 01/28/20 at 11:15am.  Please arrive 30 minutes early for a chest x-ray to be performed by [facility] located on the first floor of the same building.  Contact information: [address] [place] [address] [phone]           Signed: Antony Odea, PA-C 01/22/2020, 9:18 AM

## 2020-01-21 NOTE — Progress Notes (Addendum)
[address]       [place] [address]             [phone]      1 Day Post-Op Procedure(s) (LRB): XI ROBOTIC ASSISTED THORASCOPY-RIGHT LOWER LOBE WEDGE RESECTION, RIGHT LOWER LOBECTOMY (Right) Node Dissection Intercostal Nerve Block (Right) Subjective: Primary issue is pain , mostly from CT site  Objective: Vital signs in last 24 hours: Temp:  [96.2 F (35.7 C)-98.9 F (37.2 C)] 98.1 F (36.7 C) (04/09 0735) Pulse Rate:  [59-79] 67 (04/09 0735) Cardiac Rhythm: Normal sinus rhythm (04/09 0759) Resp:  [13-24] 16 (04/09 0735) BP: (107-140)/(67-91) 120/77 (04/09 0735) SpO2:  [94 %-100 %] 95 % (04/09 0735) Arterial Line BP: (130-133)/(70-74) 130/70 (04/08 1300)  Hemodynamic parameters for last 24 hours:    Intake/Output from previous day: 04/08 0701 - 04/09 0700 In: 3050 [P.O.:100; I.V.:2500; IV Piggyback:450] Out: 0102 [Urine:1860; Blood:150; Chest Tube:540] Intake/Output this shift: No intake/output data recorded.  General appearance: alert, cooperative and no distress Heart: regular rate and rhythm Lungs: mildly dim in bases Abdomen: benign Extremities: no edema Wound: dressings intact  Lab Results: Recent Labs    01/18/20 1220 01/20/20 1024 01/20/20 1149 01/21/20 0135  WBC 5.0  --   --  9.2  HGB 14.3   < > 12.6 11.6*  HCT 45.5   < > 37.0 35.0*  PLT 223  --   --  164   < > = values in this interval not displayed.   BMET:  Recent Labs    01/18/20 1220 01/20/20 1024 01/20/20 1149 01/21/20 0135  NA 140   < > 138 136  K 4.1   < > 3.6 3.8  CL 108  --   --  101  CO2 21*  --   --  27  GLUCOSE 94  --   --  119*  BUN 16  --   --  12  CREATININE 0.66  --   --  0.67  CALCIUM 8.9  --   --  8.6*   < > = values in this interval not displayed.    PT/INR:  Recent Labs    01/18/20 1220  LABPROT 13.0  INR 1.0   ABG    Component Value Date/Time   PHART 7.241 (L) 01/20/2020 1149   HCO3 27.0 01/20/2020 1149   TCO2 29 01/20/2020  1149   ACIDBASEDEF 2.0 01/20/2020 1149   O2SAT 95.0 01/20/2020 1149   CBG (last 3)  No results for input(s): GLUCAP in the last 72 hours.  Meds Scheduled Meds: . acetaminophen  1,000 mg Oral Q6H   Or  . acetaminophen (TYLENOL) oral liquid 160 mg/5 mL  1,000 mg Oral Q6H  . bisacodyl  10 mg Oral Daily  . ciprofloxacin  500 mg Oral BID  . enoxaparin (LOVENOX) injection  40 mg Subcutaneous Daily  . ketorolac  30 mg Intravenous Q6H  . senna-docusate  1 tablet Oral QHS   Continuous Infusions: . sodium chloride     PRN Meds:.Place/Maintain arterial line **AND** sodium chloride, morphine injection, ondansetron (ZOFRAN) IV, traMADol  Xrays DG Chest Port 1 View  Result Date: 01/20/2020 CLINICAL DATA:  Postop.  Video bronchoscopy. EXAM: PORTABLE CHEST 1 VIEW COMPARISON:  Preoperative radiograph and CT 01/18/2020 FINDINGS: Right chest tube in place with tip directed towards the apex. No visualized pneumothorax. Unchanged heart size and mediastinal contours. Aortic atherosclerosis. No pulmonary edema, focal airspace disease or large pleural effusion. IMPRESSION:  No visualized pneumothorax post bronchoscopy. Right chest tube in place. Electronically Signed   By: Keith Rake M.D.   On: 01/20/2020 15:16   DG C-ARM BRONCHOSCOPY  Result Date: 01/20/2020 C-ARM BRONCHOSCOPY: Fluoroscopy was utilized by the requesting physician.  No radiographic interpretation.    Assessment/Plan: S/P Procedure(s) (LRB): XI ROBOTIC ASSISTED THORASCOPY-RIGHT LOWER LOBE WEDGE RESECTION, RIGHT LOWER LOBECTOMY (Right) Node Dissection Intercostal Nerve Block (Right)   1 overall doing well 2 hemodyn stable in sinus rhythm 3 sats good on RA 4 mod CT drainage- serosang- 540 cc yesterday, no air leak 5 CXR- trace apical pntx on R- hopefully can remove tube later today 6 pain control 7 routine pulm toilet/rehab 8 labs - very minor ABL anemia  LOS: 1 day    John Giovanni PA-C Pager [phone]  [phone] 01/21/2020  Agree with above Doing well Chest tube removed, but there is a small apical space. We will recheck chest x-ray tomorrow, if stable cleared for discharge  Aneka Fagerstrom O Chene Kasinger

## 2020-01-21 NOTE — Plan of Care (Signed)

## 2020-01-22 ENCOUNTER — Inpatient Hospital Stay (HOSPITAL_COMMUNITY): Payer: Medicare HMO

## 2020-01-22 LAB — COMPREHENSIVE METABOLIC PANEL
ALT: 15 U/L (ref 0–44)
AST: 25 U/L (ref 15–41)
Albumin: 3.2 g/dL — ABNORMAL LOW (ref 3.5–5.0)
Alkaline Phosphatase: 61 U/L (ref 38–126)
Anion gap: 8 (ref 5–15)
BUN: 16 mg/dL (ref 8–23)
CO2: 27 mmol/L (ref 22–32)
Calcium: 8.6 mg/dL — ABNORMAL LOW (ref 8.9–10.3)
Chloride: 106 mmol/L (ref 98–111)
Creatinine, Ser: 0.96 mg/dL (ref 0.44–1.00)
GFR calc Af Amer: >60 mL/min (ref >60)
GFR calc non Af Amer: >60 mL/min (ref >60)
Glucose, Bld: 108 mg/dL — ABNORMAL HIGH (ref 70–99)
Potassium: 3.9 mmol/L (ref 3.5–5.1)
Sodium: 141 mmol/L (ref 135–145)
Total Bilirubin: 0.6 mg/dL (ref 0.3–1.2)
Total Protein: 5.3 g/dL — ABNORMAL LOW (ref 6.5–8.1)

## 2020-01-22 LAB — CBC
HCT: 36.4 % (ref 36.0–46.0)
Hemoglobin: 11.5 g/dL — ABNORMAL LOW (ref 12.0–15.0)
MCH: 31 pg (ref 26.0–34.0)
MCHC: 31.6 g/dL (ref 30.0–36.0)
MCV: 98.1 fL (ref 80.0–100.0)
Platelets: 185 10*3/uL (ref 150–400)
RBC: 3.71 MIL/uL — ABNORMAL LOW (ref 3.87–5.11)
RDW: 13.2 % (ref 11.5–15.5)
WBC: 6.7 10*3/uL (ref 4.0–10.5)
nRBC: 0 % (ref 0.0–0.2)

## 2020-01-22 LAB — GLUCOSE, CAPILLARY: Glucose-Capillary: 158 mg/dL — ABNORMAL HIGH (ref 70–99)

## 2020-01-22 MED ORDER — TRAMADOL HCL 50 MG PO TABS: 50.0000 mg | ORAL_TABLET | Freq: Four times a day (QID) | ORAL | 0 refills | Status: DC | PRN

## 2020-01-22 MED ORDER — ACETAMINOPHEN 325 MG PO TABS: 650.0000 mg | ORAL_TABLET | ORAL | 2 refills | Status: AC | PRN

## 2020-01-22 NOTE — Discharge Instructions (Signed)
Discharge Instructions:  1. You may remove the dressings tomorrow, 01/23/20, and leave incisions open to air. You may shower, please wash incisions daily with soap and water and keep dry.  If you wish to cover wounds with dressing you may do so but please keep clean and change daily.  No tub baths or swimming until incisions have completely healed.  If your incisions become red or develop any drainage please call our office at [phone]  2. No Driving until cleared by Dr. Abran Duke office and you are no longer using narcotic pain medications  3. Fever of 101.5 for at least 24 hours with no source, please contact our office at [phone]  4. Activity- up as tolerated, please walk at least 3 times per day.  Avoid strenuous activity, no lifting, pushing, or pulling with your arms over 8-10 lbs for a minimum of 6 weeks  5. If any questions or concerns arise, please do not hesitate to contact our office at [phone]

## 2020-01-22 NOTE — Progress Notes (Signed)
Pt more confused than earlier.  Does not know place, time or situation.  Spoke with husband which says she had not been this bad.  Md Notified. VS stable.     Will check sugar and continue to monitor. Saunders Revel T

## 2020-01-22 NOTE — Progress Notes (Signed)
      [address]       [place] [address]             [phone]       2 Days Post-Op Procedure(s) (LRB): XI ROBOTIC ASSISTED THORASCOPY-RIGHT LOWER LOBE WEDGE RESECTION, RIGHT LOWER LOBECTOMY (Right) Node Dissection Intercostal Nerve Block (Right) Subjective:  Awake and alert, says she feels well. Denies shortness of breath or pain.  Remains on RA with good O2 sats.   Objective: Vital signs in last 24 hours: Temp:  [97.6 F (36.4 C)-99.2 F (37.3 C)] 99.2 F (37.3 C) (04/10 0705) Pulse Rate:  [68-76] 76 (04/10 0705) Cardiac Rhythm: Normal sinus rhythm (04/10 0439) Resp:  [15-29] 21 (04/10 0705) BP: (113-134)/(72-104) 131/81 (04/10 0705) SpO2:  [96 %-100 %] 100 % (04/10 0705)     Intake/Output from previous day: 04/09 0701 - 04/10 0700 In: 960 [P.O.:960] Out: -  Intake/Output this shift: No intake/output data recorded.  Heart: regular rate and rhythm Lungs: clear to auscultation bilaterally Wound: All incisions are intact and dry. No palpable subQ air. in right chest or neck.  Lab Results: Recent Labs    01/21/20 0135 01/22/20 0226  WBC 9.2 6.7  HGB 11.6* 11.5*  HCT 35.0* 36.4  PLT 164 185   BMET:  Recent Labs    01/21/20 0135 01/22/20 0226  NA 136 141  K 3.8 3.9  CL 101 106  CO2 27 27  GLUCOSE 119* 108*  BUN 12 16  CREATININE 0.67 0.96  CALCIUM 8.6* 8.6*    PT/INR: No results for input(s): LABPROT, INR in the last 72 hours. ABG    Component Value Date/Time   PHART 7.241 (L) 01/20/2020 1149   HCO3 27.0 01/20/2020 1149   TCO2 29 01/20/2020 1149   ACIDBASEDEF 2.0 01/20/2020 1149   O2SAT 95.0 01/20/2020 1149   CBG (last 3)  Recent Labs    01/22/20 0008  GLUCAP 158*    Assessment/Plan: S/P Procedure(s) (LRB): XI ROBOTIC ASSISTED THORASCOPY-RIGHT LOWER LOBE WEDGE RESECTION, RIGHT LOWER LOBECTOMY (Right) Node Dissection Intercostal Nerve Block (Right)   -POD-2 right Robot-Assisted right lower lobectomy for a  1.8cm adenocarcinoma. Pain is well controlled. CT removed yesterday and f/u CXR showed a slightly larger right apical / lateral air space.  CXR this Am sohws a similar apical / lateral space and a small basilar air fluid level.  She is stable clinically.  Will discharge to home today and follow up next week in the office with a CXR.   Instructions given.     LOS: 2 days    Antony Odea, PA-C [phone] 01/22/2020
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
## 2020-01-22 NOTE — Plan of Care (Signed)
  Problem: Education: Goal: Knowledge of General Education information will improve Description: Including pain rating scale, medication(s)/side effects and non-pharmacologic comfort measures Outcome: Progressing   Problem: Clinical Measurements: Goal: Ability to maintain clinical measurements within normal limits will improve Outcome: Progressing Goal: Will remain free from infection Outcome: Progressing   Problem: Activity: Goal: Risk for activity intolerance will decrease Outcome: Progressing   Problem: Coping: Goal: Level of anxiety will decrease Outcome: Progressing   Problem: Elimination: Goal: Will not experience complications related to bowel motility Outcome: Progressing

## 2020-01-24 LAB — BPAM RBC
Blood Product Expiration Date: 202105022359
Blood Product Expiration Date: 202105022359
Unit Type and Rh: 6200
Unit Type and Rh: 6200

## 2020-01-24 LAB — TYPE AND SCREEN
ABO/RH(D): A POS
Antibody Screen: NEGATIVE
Unit division: 0
Unit division: 0

## 2020-01-27 ENCOUNTER — Other Ambulatory Visit: Payer: Self-pay | Admitting: Thoracic Surgery (Cardiothoracic Vascular Surgery)

## 2020-01-27 DIAGNOSIS — R911 Solitary pulmonary nodule: Secondary | ICD-10-CM

## 2020-01-28 ENCOUNTER — Other Ambulatory Visit: Payer: Self-pay

## 2020-01-28 ENCOUNTER — Ambulatory Visit
Admission: RE | Admit: 2020-01-28 | Discharge: 2020-01-28 | Disposition: A | Discharge status: Home / Self Care | Payer: Medicare HMO | Source: Ambulatory Visit | Attending: Thoracic Surgery (Cardiothoracic Vascular Surgery) | Admitting: Thoracic Surgery (Cardiothoracic Vascular Surgery)

## 2020-01-28 ENCOUNTER — Ambulatory Visit (INDEPENDENT_AMBULATORY_CARE_PROVIDER_SITE_OTHER): Payer: Self-pay | Admitting: Thoracic Surgery (Cardiothoracic Vascular Surgery)

## 2020-01-28 ENCOUNTER — Encounter: Payer: Self-pay | Admitting: Thoracic Surgery (Cardiothoracic Vascular Surgery)

## 2020-01-28 VITALS — BP 146/98 | HR 82 | Temp 97.7°F | Resp 16 | Ht 62.5 in | Wt 126.0 lb

## 2020-01-28 DIAGNOSIS — R911 Solitary pulmonary nodule: Secondary | ICD-10-CM

## 2020-01-28 DIAGNOSIS — Z902 Acquired absence of lung [part of]: Secondary | ICD-10-CM

## 2020-01-28 DIAGNOSIS — C3431 Malignant neoplasm of lower lobe, right bronchus or lung: Secondary | ICD-10-CM

## 2020-01-28 MED ORDER — TRAMADOL HCL 50 MG PO TABS: 50.0000 mg | ORAL_TABLET | Freq: Four times a day (QID) | ORAL | 0 refills | Status: AC | PRN

## 2020-01-28 NOTE — Progress Notes (Signed)
      [address]       [place] [address]             [phone]        Lindsay Mason [place] Medical Record #[id number] Date of Birth: 1951/08/23  Referring: Ria Bush, MD Primary Care: Ria Bush, MD Primary Cardiologist:No primary care provider on file.  Reason for visit:   follow-up  History of Present Illness:     Lindsay Mason comes in for her first follow-up appointment.  Overall she is doing well.  She has had some pain at the incision sites and some mild exertional dyspnea.  She continues to use her tramadol and is requesting a refill.  Physical Exam: BP (!) 146/98 (BP Location: Right Arm, Patient Position: Sitting, Cuff Size: Normal)   Pulse 82   Temp 97.7 F (36.5 C)   Resp 16   Ht 5' 2.5" (1.588 m)   Wt 126 lb (57.2 kg)   SpO2 95% Comment: RA  BMI 22.68 kg/m   Alert NAD Incision clean.  Some mild bruising along right flank  Abdomen soft, ND No peripheral edema   Diagnostic Studies & Laboratory data: CXR: Small right effusion Path:  A. LUNG, RIGHT LOWER LOBE, WEDGE RESECTION:  - Invasive adenocarcinoma, 2 cm in greatest dimension.  - No visceral pleura invasion identified.  - See oncology table.   B. LUNG, RIGHT LOWER LOBE, COMPLETION LOBECTOMY:  - Lung parenchyma, including all final margins, negative for carcinoma.  - Six peribronchial lymph nodes, negative for carcinoma (0/6).   C. LYMPH NODE, LEVEL 12R, BIOPSY:  - One lymph node, negative for carcinoma (0/1).   D. LYMPH NODE, 7, BIOPSY:  - One lymph node, negative for carcinoma (0/1).   E. LYMPH NODE, 4R, BIOPSY:  - One lymph node, negative for carcinoma (0/1).   F. LYMPH NODE, LEVEL 12R #2, BIOPSY:  - Organizing blood clot.  - No distinct nodal tissue identified.     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  She was initially discovered in the lung cancer screening program.  Overall she is doing  well, her case was discussed in our tumor board and she would not require any adjuvant therapy.  She will require additional screening.  She will follow-up in 1 month with a chest x-ray   [name] 01/28/2020 12:30 PM

## 2020-01-30 ENCOUNTER — Encounter: Payer: Self-pay | Admitting: Family Medicine

## 2020-02-04 NOTE — Progress Notes (Signed)
LMTCB

## 2020-03-01 ENCOUNTER — Other Ambulatory Visit: Payer: Self-pay | Admitting: Thoracic Surgery (Cardiothoracic Vascular Surgery)

## 2020-03-01 ENCOUNTER — Other Ambulatory Visit: Payer: Self-pay | Admitting: Cardiothoracic Surgery

## 2020-03-01 DIAGNOSIS — C3491 Malignant neoplasm of unspecified part of right bronchus or lung: Secondary | ICD-10-CM

## 2020-03-02 ENCOUNTER — Ambulatory Visit
Admission: RE | Admit: 2020-03-02 | Discharge: 2020-03-02 | Disposition: A | Discharge status: Home / Self Care | Payer: Medicare HMO | Source: Ambulatory Visit | Attending: Thoracic Surgery (Cardiothoracic Vascular Surgery) | Admitting: Thoracic Surgery (Cardiothoracic Vascular Surgery)

## 2020-03-02 ENCOUNTER — Other Ambulatory Visit: Payer: Self-pay

## 2020-03-02 ENCOUNTER — Encounter: Payer: Self-pay | Admitting: Thoracic Surgery (Cardiothoracic Vascular Surgery)

## 2020-03-02 ENCOUNTER — Other Ambulatory Visit: Payer: Self-pay | Admitting: Thoracic Surgery (Cardiothoracic Vascular Surgery)

## 2020-03-02 ENCOUNTER — Ambulatory Visit (INDEPENDENT_AMBULATORY_CARE_PROVIDER_SITE_OTHER): Payer: Self-pay | Admitting: Thoracic Surgery (Cardiothoracic Vascular Surgery)

## 2020-03-02 VITALS — BP 140/95 | HR 72 | Temp 97.6°F | Resp 20 | Ht 62.5 in | Wt 142.0 lb

## 2020-03-02 DIAGNOSIS — C3431 Malignant neoplasm of lower lobe, right bronchus or lung: Secondary | ICD-10-CM

## 2020-03-02 DIAGNOSIS — Z902 Acquired absence of lung [part of]: Secondary | ICD-10-CM

## 2020-03-02 DIAGNOSIS — J90 Pleural effusion, not elsewhere classified: Secondary | ICD-10-CM

## 2020-03-02 DIAGNOSIS — C3491 Malignant neoplasm of unspecified part of right bronchus or lung: Secondary | ICD-10-CM

## 2020-03-02 NOTE — Progress Notes (Signed)
      [address]       [place] [address]             [phone]        Lashanda A [place]tinger [place] Medical Record #[id number] Date of Birth: 1950-10-28  Referring: Ria Bush, MD Primary Care: Ria Bush, MD Primary Cardiologist:No primary care provider on file.  Reason for visit:   follow-up  History of Present Illness:     Mrs. Speights comes in for second follow-up appointment.  She is done very well.  She denies any chest pain and only has mild shortness of breath when she exerts herself.  She does have some numbness around her access incision.  Physical Exam: BP (!) 140/95   Pulse 72   Temp 97.6 F (36.4 C) (Skin)   Resp 20   Ht 5' 2.5" (1.588 m)   Wt 142 lb (64.4 kg)   SpO2 97% Comment: RA  BMI 25.56 kg/m   Alert NAD Incision well-healed.   Abdomen soft, ND No peripheral edema   Diagnostic Studies & Laboratory data: CXR: Questionable right pleural effusion.  No pneumothorax Path: 2 cm invasive adenocarcinoma of the right lower lobe    Assessment / Plan:   69 year old female status post robotic assisted right lobectomy for a T1b N0 M0 adenocarcinoma of the right lower lobe.  Chest x-ray shows possible right pleural effusion but she is not very symptomatic from a respiratory standpoint.  I have ordered an ultrasound-guided thoracentesis.  I will see her back in 3 months with a repeat chest x-ray.    [name] 03/02/2020 3:32 PM

## 2020-03-03 ENCOUNTER — Ambulatory Visit: Payer: Medicare HMO | Admitting: Thoracic Surgery (Cardiothoracic Vascular Surgery)

## 2020-03-06 ENCOUNTER — Ambulatory Visit (HOSPITAL_COMMUNITY): Payer: Medicare HMO | Attending: Family Medicine

## 2020-03-09 ENCOUNTER — Ambulatory Visit (HOSPITAL_COMMUNITY): Admission: RE | Admit: 2020-03-09 | Payer: Medicare HMO | Source: Ambulatory Visit

## 2020-03-23 ENCOUNTER — Encounter: Payer: Self-pay | Admitting: Emergency Medicine

## 2020-04-05 ENCOUNTER — Telehealth: Payer: Self-pay | Admitting: Pulmonary Disease

## 2020-04-05 NOTE — Telephone Encounter (Signed)
Patient husband contacted and follow up recall placed for Dr. Valeta Harms in one month. Patient received a letter requesting  she make an appointment.

## 2020-06-01 ENCOUNTER — Other Ambulatory Visit: Payer: Self-pay | Admitting: Thoracic Surgery (Cardiothoracic Vascular Surgery)

## 2020-06-01 DIAGNOSIS — C3491 Malignant neoplasm of unspecified part of right bronchus or lung: Secondary | ICD-10-CM

## 2020-06-02 ENCOUNTER — Encounter: Payer: Self-pay | Admitting: Thoracic Surgery (Cardiothoracic Vascular Surgery)

## 2020-06-02 ENCOUNTER — Ambulatory Visit
Admission: RE | Admit: 2020-06-02 | Discharge: 2020-06-02 | Disposition: A | Discharge status: Home / Self Care | Payer: Medicare HMO | Source: Ambulatory Visit | Attending: Thoracic Surgery (Cardiothoracic Vascular Surgery) | Admitting: Thoracic Surgery (Cardiothoracic Vascular Surgery)

## 2020-06-02 ENCOUNTER — Other Ambulatory Visit: Payer: Self-pay

## 2020-06-02 ENCOUNTER — Ambulatory Visit (INDEPENDENT_AMBULATORY_CARE_PROVIDER_SITE_OTHER): Payer: Self-pay | Admitting: Thoracic Surgery (Cardiothoracic Vascular Surgery)

## 2020-06-02 VITALS — BP 170/100 | HR 60 | Temp 97.7°F | Resp 20 | Ht 62.5 in | Wt 134.0 lb

## 2020-06-02 DIAGNOSIS — Z902 Acquired absence of lung [part of]: Secondary | ICD-10-CM

## 2020-06-02 DIAGNOSIS — J90 Pleural effusion, not elsewhere classified: Secondary | ICD-10-CM

## 2020-06-02 DIAGNOSIS — C3491 Malignant neoplasm of unspecified part of right bronchus or lung: Secondary | ICD-10-CM

## 2020-06-02 NOTE — Progress Notes (Signed)
      [address]       [place] [address]             [phone]        Gloristine A [facility] Medical Record #[id number] Date of Birth: [birth year]  Referring: Ria Bush, MD Primary Care: Ria Bush, MD Primary Cardiologist:No primary care provider on file.  Reason for visit:   follow-up  History of Present Illness:     69 year old female that underwent a robotic assisted right lower lobectomy in April of 2021 for a stage Ia adenocarcinoma. Overall she is doing well. She has no pulmonary symptoms. She did recently lose her father due to natural causes.  She remains tobacco free  Physical Exam: BP (!) 170/100   Pulse 60   Temp 97.7 F (36.5 C) (Skin)   Resp 20   Ht 5' 2.5" (1.588 m)   Wt 134 lb (60.8 kg)   SpO2 95% Comment: RA  BMI 24.12 kg/m   Alert NAD Incision clean.   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 Overall doing well. Chest x-ray showed resolution of the pleural effusion.  She did not require any adjuvant therapy, though she will require continued surveillance. She is scheduled to undergo a repeat low-dose CT scan of the chest and March 2022. I will see her back in clinic problems   [name] 06/02/2020 2:33 PM

## 2020-10-10 IMAGING — DX DG CHEST 1V PORT
1 series · 1 of 1 positions shown · non-contrast
Comparison: One-view chest x-ray 01/20/2020

CLINICAL DATA: Chest tube. Pneumothorax.

EXAM:
PORTABLE CHEST 1 VIEW

[chest]
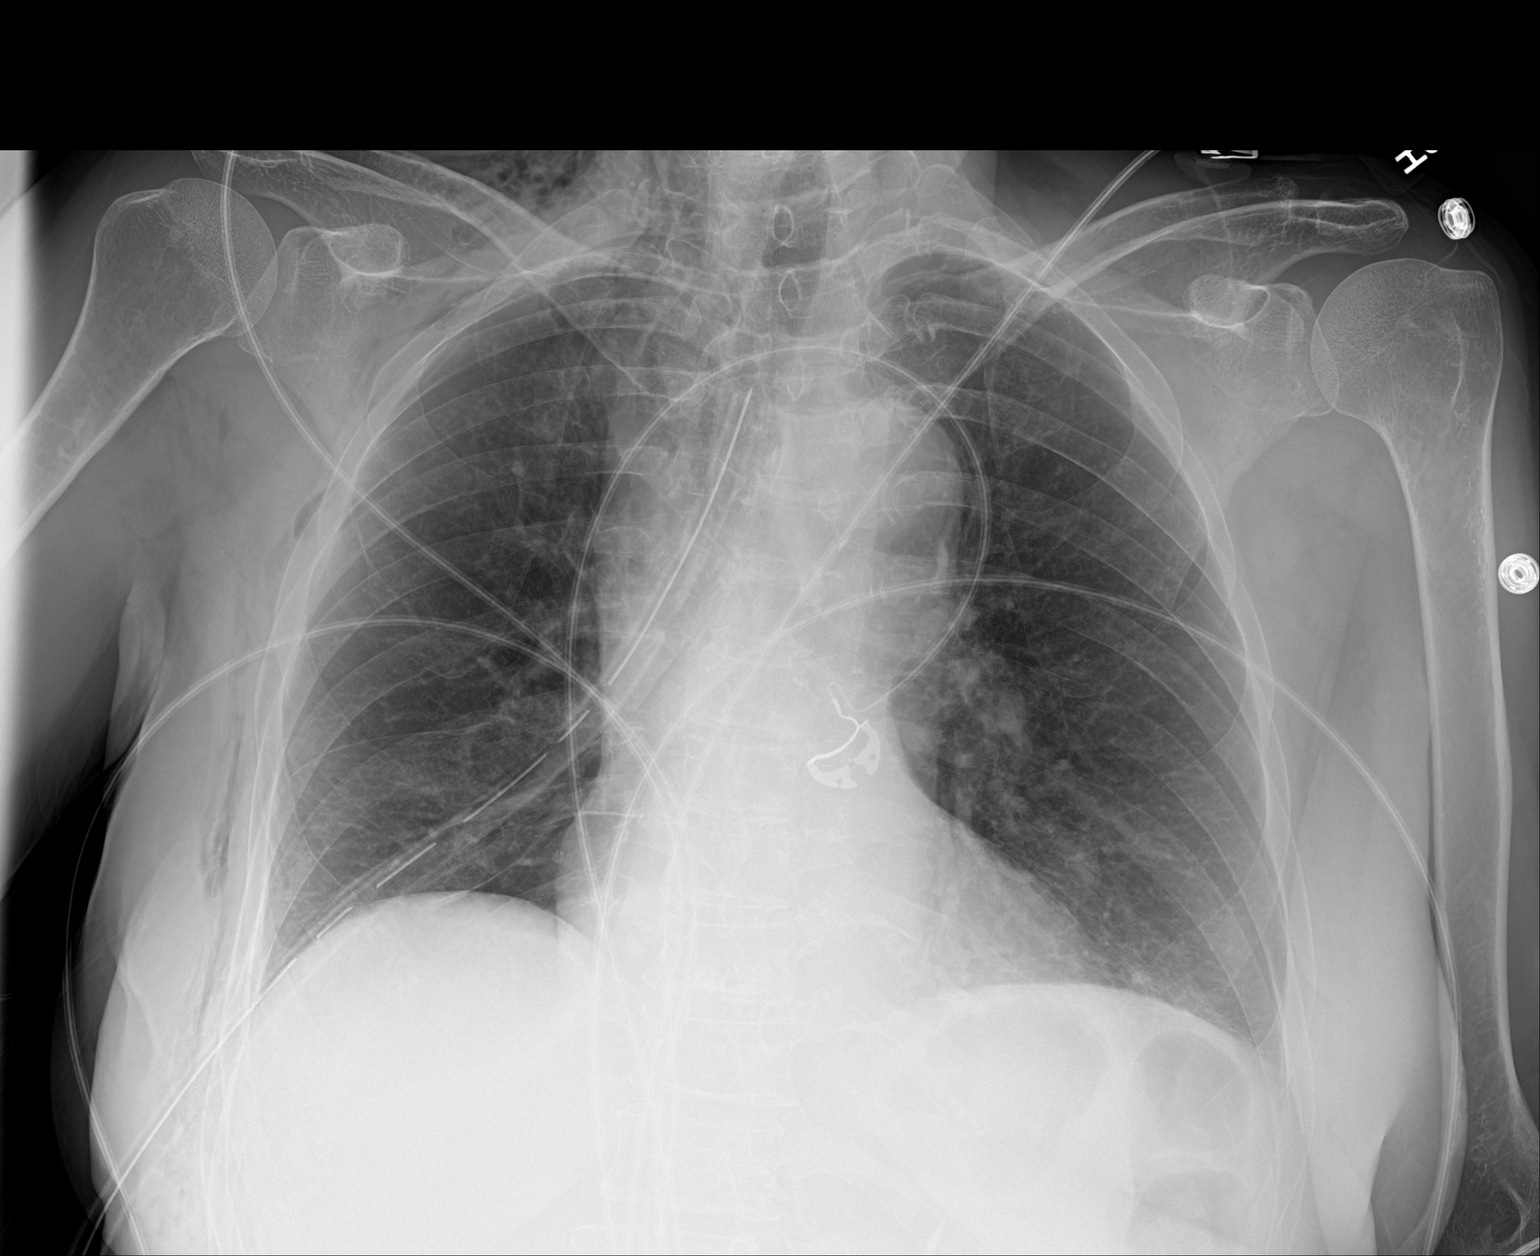

[1 of 1 positions shown; findings below may reference images not displayed]

FINDINGS: Heart size is normal. Atherosclerotic calcifications are present in
the aorta. Right-sided chest tube remains in place. No significant
pneumothorax is present. Subcutaneous emphysema has increased since
the prior exam over the right chest and neck. Left lung is clear.
Lung bases are clear.
IMPRESSION: 1. No significant pneumothorax.
2. Increasing subcutaneous emphysema over the right chest and neck
suggesting an air leak.
3. Stable right-sided chest tube.

## 2020-10-10 IMAGING — DX DG CHEST 1V PORT
1 series · 1 of 1 positions shown · non-contrast
Comparison: Same day.

CLINICAL DATA: Pneumothorax.

EXAM:
PORTABLE CHEST 1 VIEW

[chest]
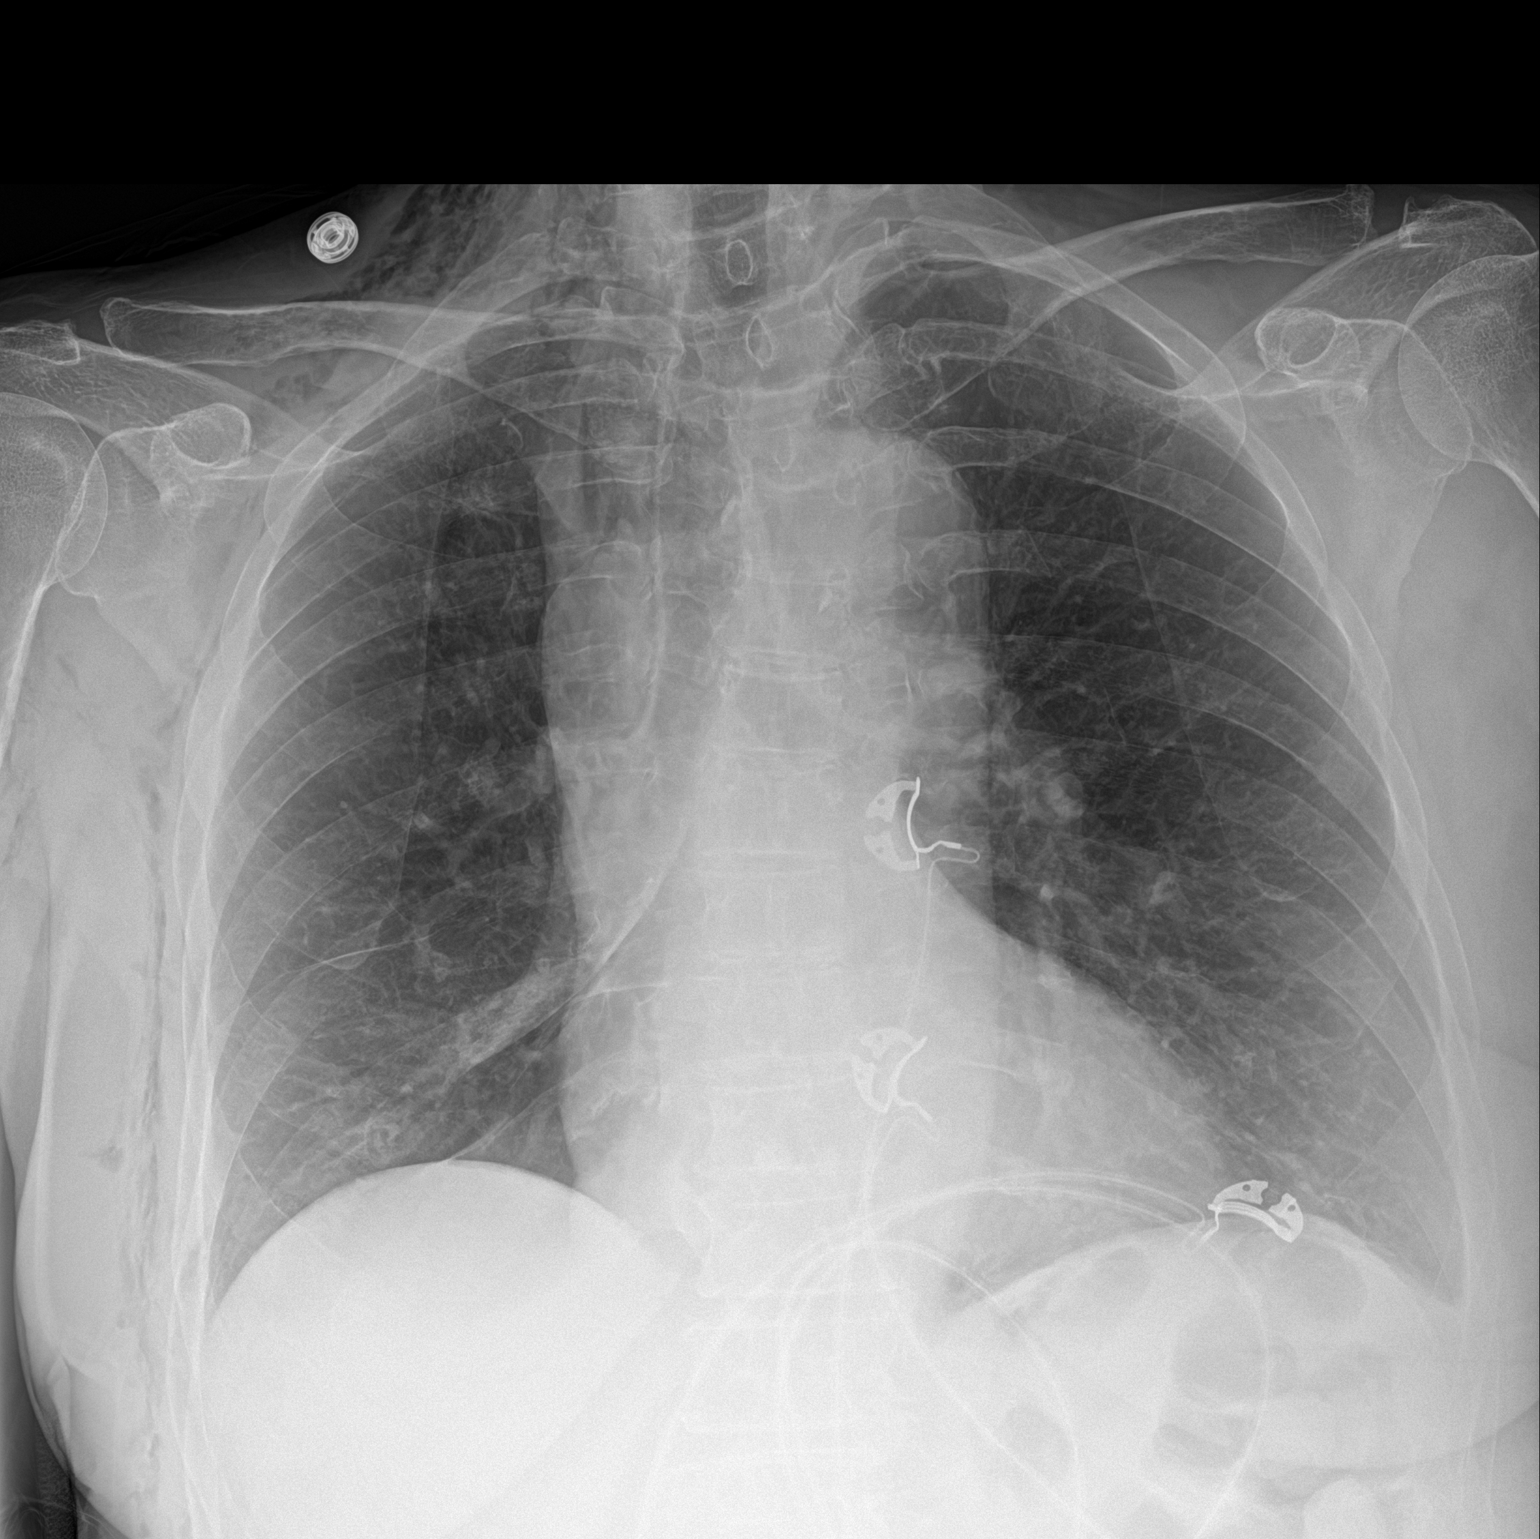

[1 of 1 positions shown; findings below may reference images not displayed]

FINDINGS: The heart size and mediastinal contours are within normal limits.
Small right apical pneumothorax is noted with overlying subcutaneous
emphysema in the right supraclavicular and lateral chest wall
regions. Left lung is clear. No pleural effusion is noted. The
visualized skeletal structures are unremarkable.
IMPRESSION: Small right apical pneumothorax is noted with overlying subcutaneous
emphysema in right supraclavicular and lateral chest wall regions.
No other abnormality seen in the chest.

## 2020-11-15 ENCOUNTER — Other Ambulatory Visit: Payer: Self-pay | Admitting: Thoracic Surgery (Cardiothoracic Vascular Surgery)

## 2020-11-15 DIAGNOSIS — C3491 Malignant neoplasm of unspecified part of right bronchus or lung: Secondary | ICD-10-CM

## 2020-12-11 ENCOUNTER — Telehealth: Payer: Self-pay

## 2020-12-11 MED ORDER — ALBUTEROL SULFATE HFA 108 (90 BASE) MCG/ACT IN AERS: INHALATION_SPRAY | RESPIRATORY_TRACT | 3 refills | Status: DC

## 2020-12-11 MED ORDER — ALBUTEROL SULFATE HFA 108 (90 BASE) MCG/ACT IN AERS: INHALATION_SPRAY | RESPIRATORY_TRACT | 0 refills | Status: DC

## 2020-12-11 NOTE — Telephone Encounter (Signed)
E-scribed refill to [facility].  Plz schedule wellness, lab and cpe visits.

## 2020-12-11 NOTE — Telephone Encounter (Signed)
-----   Message from [place] sent at 12/11/2020  1:06 PM EST ----- Regarding: Refill medication Contact: [phone] Lindsay Mason is wanting to transfer pharmacies to [facility]. She is asking for a refill of her inhaler. Will you please send to [facility] ? Thank you,  [name]

## 2020-12-29 ENCOUNTER — Encounter: Payer: Self-pay | Admitting: Thoracic Surgery (Cardiothoracic Vascular Surgery)

## 2020-12-29 ENCOUNTER — Other Ambulatory Visit: Payer: Self-pay

## 2020-12-29 ENCOUNTER — Ambulatory Visit
Admission: RE | Admit: 2020-12-29 | Discharge: 2020-12-29 | Disposition: A | Discharge status: Home / Self Care | Payer: Medicare HMO | Source: Ambulatory Visit | Attending: Thoracic Surgery (Cardiothoracic Vascular Surgery) | Admitting: Thoracic Surgery (Cardiothoracic Vascular Surgery)

## 2020-12-29 ENCOUNTER — Ambulatory Visit: Payer: Medicare HMO | Admitting: Thoracic Surgery (Cardiothoracic Vascular Surgery)

## 2020-12-29 VITALS — BP 160/80 | HR 76 | Resp 20 | Ht 62.5 in | Wt 140.0 lb

## 2020-12-29 DIAGNOSIS — Z902 Acquired absence of lung [part of]: Secondary | ICD-10-CM

## 2020-12-29 DIAGNOSIS — C3491 Malignant neoplasm of unspecified part of right bronchus or lung: Secondary | ICD-10-CM

## 2020-12-29 DIAGNOSIS — Z85118 Personal history of other malignant neoplasm of bronchus and lung: Secondary | ICD-10-CM | POA: Diagnosis not present

## 2020-12-29 DIAGNOSIS — J432 Centrilobular emphysema: Secondary | ICD-10-CM | POA: Diagnosis not present

## 2020-12-29 DIAGNOSIS — J984 Other disorders of lung: Secondary | ICD-10-CM | POA: Diagnosis not present

## 2020-12-29 DIAGNOSIS — M47819 Spondylosis without myelopathy or radiculopathy, site unspecified: Secondary | ICD-10-CM | POA: Diagnosis not present

## 2020-12-29 NOTE — Progress Notes (Signed)
[address]       [place] [address]             [phone]                    Lindsay Mason [facility] Medical Record #[id number] Date of Birth: 06-11-1951  Referring: Ria Bush, MD Primary Care: Ria Bush, MD Primary Cardiologist: No primary care provider on file.  Chief Complaint:    Chief Complaint  Patient presents with  . Lung Cancer    8 month f/u with Chest CT    History of Present Illness:    Lindsay Mason 70 y.o. 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  Overall she has been doing quite well.  She ambulates every day without any dyspnea.  Her grandchildren are doing well.  She is received lots of feedback from her video interview with [facility].    Past Medical History:  Diagnosis Date  . Adrenal hyperplasia ([facility]) 12/2011   on CT  . Allergy   . CAP (community acquired pneumonia) 12/2011  . Chest pain    normal ETT 10/2011  . 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 Ex-smoker 08/2014   currently using e cig  . Microscopic colitis 06/2015   lymphocytic by colonoscopy - started entocort [name]  . Osteoporosis, unspecified   . Scoliosis of lumbar spine   . Thoracic aortic atherosclerosis ([facility]) 02/2016   by CT    Past Surgical History:  Procedure Laterality Date  . COLONOSCOPY  06/2015   microscopic colitis [name]  . DEXA  10/2008   Osteoporosis  . DEXA  02/2014   T score osteopenia hip, osteoporosis spine, scoliosis  . ELECTROMAGNETIC NAVIGATION BROCHOSCOPY  01/20/2020   Procedure: ELECTROMAGNETIC NAVIGATION BRONCHOSCOPY;  Surgeon: [name], DO;  Location: [facility];  Service: Pulmonary;;  . ETT  10/2011   WNL, no evidence of ischemia, excellent exercise tolerance  . EXPLORATORY LAPAROTOMY    . INTERCOSTAL NERVE BLOCK Right 01/20/2020   Procedure: Intercostal Nerve  Block;  Surgeon: [name], MD;  Location: [facility];  Service: Thoracic;  Laterality: Right;  . MRI head  07/2011   multiple foci deep and subcortical white matter, chronic ischemic vs demyelinating, no active disease  . NODE DISSECTION  01/20/2020   Procedure: Node Dissection;  Surgeon: [name], MD;  Location: [facility];  Service: Thoracic;;  . SUBMUCOSAL TATTOO INJECTION  01/20/2020   Procedure: SUBSTITIAL TATTOO INJECTION;  Surgeon: [name], DO;  Location: [facility];  Service: Pulmonary;;  . TONSILLECTOMY AND ADENOIDECTOMY    . Vein removal     removed from legs  . VIDEO ASSISTED THORACOSCOPY (VATS)/WEDGE RESECTION Right 01/20/2020   XI ROBOTIC ASSISTED THORASCOPY-RIGHT LOWER LOBE WEDGE RESECTION, RIGHT LOWER LOBECTOMYRightGeneral  . VIDEO BRONCHOSCOPY  01/20/2020   Procedure: VIDEO BRONCHOSCOPY WITH FLUORO;  Surgeon: [name], DO;  Location: [facility];  Service: Pulmonary;;    Family History  Problem Relation Age of Onset  . Osteoporosis Other        great grandmother  . Stroke Maternal Grandmother   . Coronary artery disease Maternal Grandfather 50  . Diabetes Maternal Grandfather   . Coronary artery disease Paternal Grandmother 80  . Coronary artery disease Paternal Grandfather 81  .  Lung cancer Maternal Aunt      Social History   Tobacco Use  Smoking Status Former Smoker  . Packs/day: 1.00  . Years: 30.00  . Pack years: 30.00  . Types: Cigarettes  . Start date: 10/14/1978  . Quit date: 07/15/2015  . Years since quitting: 5.4  Smokeless Tobacco Never Used  Tobacco Comment   off and on smoker, now e-cigarettes    Social History   Substance and Sexual Activity  Alcohol Use Yes  . Alcohol/week: 0.0 standard drinks   Comment: social wine once a week     Allergies  Allergen Reactions  . Penicillins Swelling    REACTION: swelling tongue - States has taken Cephalosporins without difficulty in past Did it involve swelling of the  face/tongue/throat, SOB, or low BP? Yes Did it involve sudden or severe rash/hives, skin peeling, or any reaction on the inside of your mouth or nose? No Did you need to seek medical attention at a hospital or doctor's office? Yes When did it last happen? Childhood reaction If all above answers are "NO", may proceed with cephalosporin use.     Current Outpatient Medications  Medication Sig Dispense Refill  . acetaminophen (TYLENOL) 325 MG tablet Take 2 tablets (650 mg total) by mouth every 4 (four) hours as needed for moderate pain. 100 tablet 2  . albuterol (VENTOLIN HFA) 108 (90 Base) MCG/ACT inhaler TAKE 2 PUFFS BY MOUTH EVERY 6 HOURS AS NEEDED FOR WHEEZE OR SHORTNESS OF BREATH 18 g 0  . Multiple Vitamin (MULTIVITAMIN) tablet Take 1 tablet by mouth daily.     No current facility-administered medications for this visit.    Review of Systems  Constitutional: Negative.   Respiratory: Negative.   Cardiovascular: Negative.     PHYSICAL EXAMINATION: BP (!) 160/80   Pulse 76   Resp 20   Ht 5' 2.5" (1.588 m)   Wt 140 lb (63.5 kg)   SpO2 97% Comment: RA  BMI 25.20 kg/m   Physical Exam Constitutional:      General: She is not in acute distress.    Appearance: Normal appearance. She is normal weight. She is not ill-appearing.  Eyes:     Extraocular Movements: Extraocular movements intact.  Cardiovascular:     Rate and Rhythm: Normal rate.  Pulmonary:     Effort: Pulmonary effort is normal. No respiratory distress.  Abdominal:     General: Abdomen is flat. There is no distension.  Musculoskeletal:     Cervical back: Normal range of motion.  Skin:    General: Skin is warm and dry.  Neurological:     General: No focal deficit present.     Mental Status: She is alert and oriented to person, place, and time.      Diagnostic Studies & Laboratory data:     Recent Radiology Findings:   CT CHEST WO CONTRAST  Result Date: 12/29/2020 CLINICAL DATA:  Lung nodule. History of  lung cancer status post VATS and chemo. EXAM: CT CHEST WITHOUT CONTRAST TECHNIQUE: Multidetector CT imaging of the chest was performed following the standard protocol without IV contrast. COMPARISON:  01/18/2020 and 03/10/2018. FINDINGS: Cardiovascular: Atherosclerotic calcification of the aorta. Heart size normal. No pericardial effusion. Mediastinum/Nodes: No pathologically enlarged mediastinal or axillary lymph nodes. Hilar regions are difficult to definitively evaluate without IV contrast but appear grossly unremarkable. Esophagus is grossly unremarkable. Lungs/Pleura: Centrilobular and paraseptal emphysema. Mild biapical pleuroparenchymal scarring. Right lower lobectomy. Nodular ground-glass in the posterior left lower lobe measures  9 x 11 mm (8/84), unchanged from 01/18/2020 but possibly slightly more organized and nodular in appearance when compared with 03/10/2018. There are additional areas of smaller vague peribronchovascular ground-glass in the left lower lobe, similar. No pleural fluid. Debris is seen in the airway. Upper Abdomen: Visualized portions of the liver and right adrenal gland are unremarkable. Low-attenuation left adrenal nodule measures 2.3 cm. 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 Visualized portions the spleen, pancreas, stomach and bowel are grossly unremarkable. Musculoskeletal: Degenerative changes in the spine. T3 superior endplate compression fracture is unchanged. IMPRESSION: 1. Ground-glass nodule in the posterior left lower lobe, similar to 01/18/2020 but slightly more organized/nodular in appearance than on 03/10/2018. Indolent adenocarcinoma cannot be excluded. 2. Left adrenal adenoma. 3.  Aortic atherosclerosis (ICD10-I70.0). 4.  Emphysema (ICD10-J43.9). Electronically Signed   By: Lorin Picket M.D.   On: 12/29/2020 10:36       I have independently reviewed the above radiology studies  and reviewed the findings with the patient.   Recent Lab  Findings: Lab Results  Component Value Date   WBC 6.7 01/22/2020   HGB 11.5 (L) 01/22/2020   HCT 36.4 01/22/2020   PLT 185 01/22/2020   GLUCOSE 108 (H) 01/22/2020   CHOL 197 12/22/2018   TRIG 57.0 12/22/2018   HDL 98.70 12/22/2018   LDLCALC 87 12/22/2018   ALT 15 01/22/2020   AST 25 01/22/2020   NA 141 01/22/2020   K 3.9 01/22/2020   CL 106 01/22/2020   CREATININE 0.96 01/22/2020   BUN 16 01/22/2020   CO2 27 01/22/2020   TSH 2.25 12/22/2018   INR 1.0 01/18/2020       Problem List: Status post right lower lobectomy for a stage Ia adenocarcinoma the lung. 11 mm groundglass opacity in the left lower lobe.  Assessment / Plan:   70 year old female that is done well from her right lower lobectomy that was performed in April 2021.  She has had a stable 11 mm left lower lobe groundglass opacity.  The consistency is slightly changed on this years CT chest.  Given her original presentation with a groundglass opacity in the right lower lobe later proved to be lung cancer I would like to follow this left lower lobe process more closely.  Her case will be discussed in our tumor board next week.  We will assess the need for potential biopsy.      [name] 12/29/2020 12:38 PM

## 2021-01-01 DIAGNOSIS — H2513 Age-related nuclear cataract, bilateral: Secondary | ICD-10-CM | POA: Diagnosis not present

## 2021-01-02 ENCOUNTER — Other Ambulatory Visit: Payer: Self-pay | Admitting: Family Medicine

## 2021-01-02 NOTE — Telephone Encounter (Signed)
Refill request Albuterol Last office visit 09/03/19 No upcoming appointment Last refill 12/11/18  18G

## 2021-01-04 ENCOUNTER — Encounter: Payer: Self-pay | Admitting: *Deleted

## 2021-01-04 ENCOUNTER — Other Ambulatory Visit: Payer: Self-pay | Admitting: *Deleted

## 2021-01-04 NOTE — Telephone Encounter (Signed)
ERx 

## 2021-01-04 NOTE — Progress Notes (Signed)
The proposed treatment discussed in cancer conference is for discussion purpose only and is not a binding recommendation.  The patients was not physically examined nor present or their treatment options.  Therefore, final treatment plans cannot be decided.

## 2021-01-05 ENCOUNTER — Telehealth: Payer: Self-pay | Admitting: Family Medicine

## 2021-01-05 ENCOUNTER — Other Ambulatory Visit: Payer: Self-pay

## 2021-01-05 ENCOUNTER — Telehealth (INDEPENDENT_AMBULATORY_CARE_PROVIDER_SITE_OTHER): Payer: Medicare HMO | Admitting: Thoracic Surgery (Cardiothoracic Vascular Surgery)

## 2021-01-05 DIAGNOSIS — Z902 Acquired absence of lung [part of]: Secondary | ICD-10-CM | POA: Diagnosis not present

## 2021-01-05 NOTE — Progress Notes (Signed)
     [address]       [place] [address]             [phone]       Patient: Home Provider: Office Consent for Telemedicine visit obtained.  Today's visit was completed via a real-time telehealth (see specific modality noted below). The patient/authorized person provided oral consent at the time of the visit to engage in a telemedicine encounter with the present provider at [facility]. The patient/authorized person was informed of the potential benefits, limitations, and risks of telemedicine. The patient/authorized person expressed understanding that the laws that protect confidentiality also apply to telemedicine. The patient/authorized person acknowledged understanding that telemedicine does not provide emergency services and that he or she would need to call 911 or proceed to the nearest hospital for help if such a need arose.  . Total time spent in the clinical discussion 15 minutes. . Telehealth Modality: Phone visit (audio only)  I had a telephone visit with Vearl Aitken, the patient's daughter, and the patient's husband.  I inadvertently called the patient's daughter because it is listed under the patient's phone number.  She gave me a lot of information in regards to her current mental state, and there has been significant concern for Lindsay Mason developing Alzheimer's.  She states that she has become much more forgetful over the past several years.  She recently forgot how to drive to her daughter's house, she often forgets to eat, and is no longer able to take care of many of her bills.  Every time this is mentioned.I becomes very angry, and she has refused to see a neurologist.  The main reason for my call was to discuss the decision made in a multidisciplinary conference in regards to the left lower lobe groundglass opacity.  Our plan is to repeat imaging in 6 months to see if there is any change.  I do feel that if.I want to be more aggressive surgical wedge  resection versus radiation would also be an option.  My overall concern is that this nodule appears very similar to her previous right-sided lung cancer that for a long time was a groundglass opacity and then changed in morphology.  The committee shares the same concern but given the size it would be difficult to obtain a biopsy at this point.  We came with the consensus of repeating imaging in 6 months.  In regards to the potential of dementia, I will speak with her primary care physician Dr. Danise Mina to see if he can arrange follow-up with neurology.  Ellory Khurana Bary Leriche

## 2021-01-05 NOTE — Telephone Encounter (Signed)
Received staff message from thoracic surgery regarding family concern for progressive memory trouble in patient. Please call daughter to offer sooner appointment than scheduled 5/4 in office for memory assessment. Would like daughter to come with her to review concerns.

## 2021-01-08 NOTE — Telephone Encounter (Signed)
Patient is in Denial and gets very angry when she is questioned or talked to about the memory concerns. Daughter states that she passes the 3  Question mini exam all the time. She is forgetting take a shower, can't cook, can't remember where her daughter lives when she's been in the same house for 20 years, will call her 4 times in a day to tell her the same things. She can't drive any longer cause she forgets where she is going and where she is at. Pts memory is recalling things of the past but has no short term memory. Patients daughter wanted me to tell you this before her appt. She has "good" days and "bad" days. EM

## 2021-01-09 NOTE — Telephone Encounter (Signed)
Noted! Thank you

## 2021-01-19 ENCOUNTER — Other Ambulatory Visit: Payer: Self-pay

## 2021-01-19 ENCOUNTER — Ambulatory Visit (INDEPENDENT_AMBULATORY_CARE_PROVIDER_SITE_OTHER): Payer: Medicare HMO | Admitting: Family Medicine

## 2021-01-19 ENCOUNTER — Encounter: Payer: Self-pay | Admitting: Family Medicine

## 2021-01-19 VITALS — BP 140/100 | HR 83 | Temp 97.6°F | Ht 62.5 in | Wt 142.5 lb

## 2021-01-19 DIAGNOSIS — Z902 Acquired absence of lung [part of]: Secondary | ICD-10-CM | POA: Diagnosis not present

## 2021-01-19 DIAGNOSIS — R911 Solitary pulmonary nodule: Secondary | ICD-10-CM

## 2021-01-19 DIAGNOSIS — R413 Other amnesia: Secondary | ICD-10-CM | POA: Diagnosis not present

## 2021-01-19 DIAGNOSIS — C3491 Malignant neoplasm of unspecified part of right bronchus or lung: Secondary | ICD-10-CM

## 2021-01-19 DIAGNOSIS — R030 Elevated blood-pressure reading, without diagnosis of hypertension: Secondary | ICD-10-CM

## 2021-01-19 LAB — CBC WITH DIFFERENTIAL/PLATELET
Basophils Absolute: 0.1 10*3/uL (ref 0.0–0.1)
Basophils Relative: 1 % (ref 0.0–3.0)
Eosinophils Absolute: 0.1 10*3/uL (ref 0.0–0.7)
Eosinophils Relative: 1.1 % (ref 0.0–5.0)
HCT: 40.2 % (ref 36.0–46.0)
Hemoglobin: 13.3 g/dL (ref 12.0–15.0)
Lymphocytes Relative: 29.8 % (ref 12.0–46.0)
Lymphs Abs: 1.5 10*3/uL (ref 0.7–4.0)
MCHC: 33 g/dL (ref 30.0–36.0)
MCV: 94.8 fl (ref 78.0–100.0)
Monocytes Absolute: 0.3 10*3/uL (ref 0.1–1.0)
Monocytes Relative: 5.3 % (ref 3.0–12.0)
Neutro Abs: 3.2 10*3/uL (ref 1.4–7.7)
Neutrophils Relative %: 62.8 % (ref 43.0–77.0)
Platelets: 244 10*3/uL (ref 150.0–400.0)
RBC: 4.24 Mil/uL (ref 3.87–5.11)
RDW: 13.9 % (ref 11.5–15.5)
WBC: 5.2 10*3/uL (ref 4.0–10.5)

## 2021-01-19 LAB — COMPREHENSIVE METABOLIC PANEL
ALT: 11 U/L (ref 0–35)
AST: 17 U/L (ref 0–37)
Albumin: 4.2 g/dL (ref 3.5–5.2)
Alkaline Phosphatase: 81 U/L (ref 39–117)
BUN: 13 mg/dL (ref 6–23)
CO2: 31 mEq/L (ref 19–32)
Calcium: 9.3 mg/dL (ref 8.4–10.5)
Chloride: 106 mEq/L (ref 96–112)
Creatinine, Ser: 0.77 mg/dL (ref 0.40–1.20)
GFR: 78.43 mL/min (ref >60.00)
Glucose, Bld: 83 mg/dL (ref 70–99)
Potassium: 4.4 mEq/L (ref 3.5–5.1)
Sodium: 142 mEq/L (ref 135–145)
Total Bilirubin: 0.7 mg/dL (ref 0.2–1.2)
Total Protein: 6.2 g/dL (ref 6.0–8.3)

## 2021-01-19 LAB — VITAMIN B12: Vitamin B-12: 116 pg/mL — ABNORMAL LOW (ref 211–911)

## 2021-01-19 LAB — TSH: TSH: 2.12 u[IU]/mL (ref 0.35–4.50)

## 2021-01-19 MED ORDER — TAMSULOSIN HCL 0.4 MG PO CAPS: 0.4000 mg | ORAL_CAPSULE | Freq: Every day | ORAL | 3 refills | Status: DC

## 2021-01-19 NOTE — Progress Notes (Addendum)
Patient ID: Lindsay Mason, female    DOB: [birth year], 70 y.o.   MRN: [id number]  This visit was conducted in person.  BP (!) 140/100   Pulse 83   Temp 97.6 F (36.4 C) (Temporal)   Ht 5' 2.5" (1.588 m)   Wt 142 lb 8 oz (64.6 kg)   SpO2 98%   BMI 25.65 kg/m   BP Readings from Last 3 Encounters:  01/19/21 (!) 140/100  12/29/20 (!) 160/80  06/02/20 (!) 170/100    CC: memory trouble  Subjective:   HPI: Lindsay Mason is a 70 y.o. female presenting on 01/19/2021 for Memory Loss (Here for memory assessment.  Pt accompanied by daughter, Lindsay Mason- temp 98.2.)   Recent lung cancer diagnosis stage Ia found on lung cancer screening, s/p successful RLL lobectomy/excisional surgery by Dr Kipp Brood.  New LLL nodule found this past month - planning to closely monitor, rpt imaging 3-6 months.   Doesn't check blood pressures at home.  Continues walking regularly at home with her dog - a few hours a day. Has treadmill for winter use.   Elevated blood pressure readings - No HA, vision changes, CP/tightness, SOB, leg swelling.   Daughter notes some progressive short term memory troubles since cancer diagnosis.  Tried Prevagen without benefit.  No urinary incontinence. No mood or behavioral changes. No tremor or unsteadiness on feet or stiffness.  Mother with h/o memory trouble after meningitis. Father recently passed away age 10yo, possible mild senile dementia at the end of life.   Geriatric Assessment: Activities of Daily Living:     Bathing- independent     Dressing- independent     Eating- independent     Toileting- independent     Transferring- independent     Continence- independent  Overall Assessment: independent   Instrumental Activities of Daily Living:     Transportation- dependent     Meal/Food Preparation- independent     Shopping Errands- independent     Housekeeping/Chores- independent     Money Management/Finances- independent     Medication Management-  independent     Ability to Use Telephone- independent     Laundry- independent  Overall Assessment:  independent   Mental Status Exam: 17/29, 19/29 with cue - main trouble with orientation and recall   Clock Drawing Score: 2/4     Relevant past medical, surgical, family and social history reviewed and updated as indicated. Interim medical history since our last visit reviewed. Allergies and medications reviewed and updated. Outpatient Medications Prior to Visit  Medication Sig Dispense Refill  . acetaminophen (TYLENOL) 325 MG tablet Take 2 tablets (650 mg total) by mouth every 4 (four) hours as needed for moderate pain. 100 tablet 2  . albuterol (VENTOLIN HFA) 108 (90 Base) MCG/ACT inhaler INHALE TWO PUFFS BY MOUTH INTO LUNGS EVERY 6 HOURS AS NEEDED FOR SHORTNESS OF BREATH OR wheezing 8.5 g 0  . Multiple Vitamin (MULTIVITAMIN) tablet Take 1 tablet by mouth daily.     No facility-administered medications prior to visit.     Per HPI unless specifically indicated in ROS section below Review of Systems Objective:  BP (!) 140/100   Pulse 83   Temp 97.6 F (36.4 C) (Temporal)   Ht 5' 2.5" (1.588 m)   Wt 142 lb 8 oz (64.6 kg)   SpO2 98%   BMI 25.65 kg/m   Wt Readings from Last 3 Encounters:  01/19/21 142 lb 8 oz (64.6 kg)  12/29/20 140 lb (  63.5 kg)  06/02/20 134 lb (60.8 kg)      Physical Exam Vitals and nursing note reviewed.  Constitutional:      Appearance: Normal appearance. She is not ill-appearing.  Neck:     Thyroid: No thyroid mass or thyromegaly.  Cardiovascular:     Rate and Rhythm: Normal rate and regular rhythm.     Pulses: Normal pulses.     Heart sounds: Normal heart sounds. No murmur heard.   Pulmonary:     Effort: Pulmonary effort is normal. No respiratory distress.     Breath sounds: Normal breath sounds. No wheezing, rhonchi or rales.  Musculoskeletal:        General: Normal range of motion.     Right lower leg: No edema.     Left lower leg: No  edema.  Skin:    General: Skin is warm and dry.     Findings: No rash.  Neurological:     Mental Status: She is alert.  Psychiatric:        Mood and Affect: Mood normal.        Behavior: Behavior normal.       Results for orders placed or performed in visit on 01/19/21  Comprehensive metabolic panel  Result Value Ref Range   Sodium 142 135 - 145 mEq/L   Potassium 4.4 3.5 - 5.1 mEq/L   Chloride 106 96 - 112 mEq/L   CO2 31 19 - 32 mEq/L   Glucose, Bld 83 70 - 99 mg/dL   BUN 13 6 - 23 mg/dL   Creatinine, Ser 0.77 0.40 - 1.20 mg/dL   Total Bilirubin 0.7 0.2 - 1.2 mg/dL   Alkaline Phosphatase 81 39 - 117 U/L   AST 17 0 - 37 U/L   ALT 11 0 - 35 U/L   Total Protein 6.2 6.0 - 8.3 g/dL   Albumin 4.2 3.5 - 5.2 g/dL   GFR 78.43 >60.00 mL/min   Calcium 9.3 8.4 - 10.5 mg/dL  TSH  Result Value Ref Range   TSH 2.12 0.35 - 4.50 uIU/mL  CBC with Differential/Platelet  Result Value Ref Range   WBC 5.2 4.0 - 10.5 K/uL   RBC 4.24 3.87 - 5.11 Mil/uL   Hemoglobin 13.3 12.0 - 15.0 g/dL   HCT 40.2 36.0 - 46.0 %   MCV 94.8 78.0 - 100.0 fl   MCHC 33.0 30.0 - 36.0 g/dL   RDW 13.9 11.5 - 15.5 %   Platelets 244.0 150.0 - 400.0 K/uL   Neutrophils Relative % 62.8 43.0 - 77.0 %   Lymphocytes Relative 29.8 12.0 - 46.0 %   Monocytes Relative 5.3 3.0 - 12.0 %   Eosinophils Relative 1.1 0.0 - 5.0 %   Basophils Relative 1.0 0.0 - 3.0 %   Neutro Abs 3.2 1.4 - 7.7 K/uL   Lymphs Abs 1.5 0.7 - 4.0 K/uL   Monocytes Absolute 0.3 0.1 - 1.0 K/uL   Eosinophils Absolute 0.1 0.0 - 0.7 K/uL   Basophils Absolute 0.1 0.0 - 0.1 K/uL  Vitamin B12  Result Value Ref Range   Vitamin B-12 116 (L) 211 - 911 pg/mL   Assessment & Plan:  This visit occurred during the SARS-CoV-2 public health emergency.  Safety protocols were in place, including screening questions prior to the visit, additional usage of staff PPE, and extensive cleaning of exam room while observing appropriate contact time as indicated for  disinfecting solutions.   Problem List Items Addressed This Visit  Primary lung adenocarcinoma, right [facility])    Appreciate thoracic surgery care.       S/P lobectomy of lung   Elevated blood pressure reading without diagnosis of hypertension    Staying elevated, on repeat office visits. Pt denies h/o HTN. Provided with DASH diet, recommend low sodium diet, recommend buy BP cuff to start monitoring BP at home and let me know if consistently >140/90 to start medication. BP does improve on retesting today.       Memory deficit - Primary    Newly noted by family since lung cancer diagnosis.  Walking and talking fine. Predominant problem with short term memory. No significant fmhx dementia.  MMSE today with moderate amount of difficulty noted. Discussed this with pt and daughter. Reviewed healthy lifestyle choices to help keep mind healthy.  Will check head CT in cancer hx, will check labs for reversible causes of memory trouble.       Relevant Orders   Comprehensive metabolic panel (Completed)   TSH (Completed)   CBC with Differential/Platelet (Completed)   Vitamin B12 (Completed)   RPR   CT Head Wo Contrast   Pulmonary nodule, left    Newly noted last month - planning short term f/u.           Meds ordered this encounter  Medications  . DISCONTD: tamsulosin (FLOMAX) 0.4 MG CAPS capsule    Sig: Take 1 capsule (0.4 mg total) by mouth daily after supper.    Dispense:  30 capsule    Refill:  3   Orders Placed This Encounter  Procedures  . CT Head Wo Contrast    Standing Status:   Future    Standing Expiration Date:   01/19/2022    Order Specific Question:   Preferred imaging location?    Answer:   [facility]  . Comprehensive metabolic panel  . TSH  . CBC with Differential/Platelet  . Vitamin B12  . RPR    Patient instructions: Blood pressures were elevated today - start tracking BP at home and let me know if consistently elevated.  Memory testing did  return showing some trouble with memory - labs today. We will also schedule head CT for further evaluation.   Reviewed 4 core lifestyle modifications to support a healthy mind:  1. Nutritious well balance diet.  2. Regular physical activity routine.  3. Regular mental activity such as reading books, word puzzles, math puzzles, jigsaw puzzles.  4. Social engagement.   Return for physical at your convenience (3-6 months).   Follow up plan: Return in about 6 months (around 07/21/2021) for annual exam, prior fasting for blood work, medicare wellness visit.  Ria Bush, MD

## 2021-01-19 NOTE — Patient Instructions (Addendum)
Blood pressures were elevated today - start tracking BP at home and let me know if consistently elevated.  Memory testing did return showing some trouble with memory - labs today. We will also schedule head CT for further evaluation.   Reviewed 4 core lifestyle modifications to support a healthy mind:  1. Nutritious well balance diet.  2. Regular physical activity routine.  3. Regular mental activity such as reading books, word puzzles, math puzzles, jigsaw puzzles.  4. Social engagement.   Return for physical at your convenience (3-6 months).    [email or web address]">  DASH Eating Plan DASH stands for Dietary Approaches to Stop Hypertension. The DASH eating plan is a healthy eating plan that has been shown to:  Reduce high blood pressure (hypertension).  Reduce your risk for type 2 diabetes, heart disease, and stroke.  Help with weight loss. What are tips for following this plan? Reading food labels  Check food labels for the amount of salt (sodium) per serving. Choose foods with less than 5 percent of the Daily Value of sodium. Generally, foods with less than 300 milligrams (mg) of sodium per serving fit into this eating plan.  To find whole grains, look for the word "whole" as the first word in the ingredient list. Shopping  Buy products labeled as "low-sodium" or "no salt added."  Buy fresh foods. Avoid canned foods and pre-made or frozen meals. Cooking  Avoid adding salt when cooking. Use salt-free seasonings or herbs instead of table salt or sea salt. Check with your health care provider or pharmacist before using salt substitutes.  Do not fry foods. Cook foods using healthy methods such as baking, boiling, grilling, roasting, and broiling instead.  Cook with heart-healthy oils, such as olive, canola, avocado, soybean, or sunflower oil. Meal planning  Eat a balanced diet that includes: ? 4 or more servings of fruits and 4 or  more servings of vegetables each day. Try to fill one-half of your plate with fruits and vegetables. ? 6-8 servings of whole grains each day. ? Less than 6 oz (170 g) of lean meat, poultry, or fish each day. A 3-oz (85-g) serving of meat is about the same size as a deck of cards. One egg equals 1 oz (28 g). ? 2-3 servings of low-fat dairy each day. One serving is 1 cup (237 mL). ? 1 serving of nuts, seeds, or beans 5 times each week. ? 2-3 servings of heart-healthy fats. Healthy fats called omega-3 fatty acids are found in foods such as walnuts, flaxseeds, fortified milks, and eggs. These fats are also found in cold-water fish, such as sardines, salmon, and mackerel.  Limit how much you eat of: ? Canned or prepackaged foods. ? Food that is high in trans fat, such as some fried foods. ? Food that is high in saturated fat, such as fatty meat. ? Desserts and other sweets, sugary drinks, and other foods with added sugar. ? Full-fat dairy products.  Do not salt foods before eating.  Do not eat more than 4 egg yolks a week.  Try to eat at least 2 vegetarian meals a week.  Eat more home-cooked food and less restaurant, buffet, and fast food.   Lifestyle  When eating at a restaurant, ask that your food be prepared with less salt or no salt, if possible.  If you drink alcohol: ? Limit how much you use to:  0-1 drink a day for women who are not pregnant.  0-2 drinks a day for men. ?  Be aware of how much alcohol is in your drink. In the [place] one drink equals one 12 oz bottle of beer (355 mL), one 5 oz glass of wine (148 mL), or one 1 oz glass of hard liquor (44 mL). General information  Avoid eating more than 2,300 mg of salt a day. If you have hypertension, you may need to reduce your sodium intake to 1,500 mg a day.  Work with your health care provider to maintain a healthy body weight or to lose weight. Ask what an ideal weight is for you.  Get at least 30 minutes of exercise that  causes your heart to beat faster (aerobic exercise) most days of the week. Activities may include walking, swimming, or biking.  Work with your health care provider or dietitian to adjust your eating plan to your individual calorie needs. What foods should I eat? Fruits All fresh, dried, or frozen fruit. Canned fruit in natural juice (without added sugar). Vegetables Fresh or frozen vegetables (raw, steamed, roasted, or grilled). Low-sodium or reduced-sodium tomato and vegetable juice. Low-sodium or reduced-sodium tomato sauce and tomato paste. Low-sodium or reduced-sodium canned vegetables. Grains Whole-grain or whole-wheat bread. Whole-grain or whole-wheat pasta. Brown rice. [place]. Bulgur. Whole-grain and low-sodium cereals. Pita bread. Low-fat, low-sodium crackers. Whole-wheat flour tortillas. Meats and other proteins Skinless chicken or [place]. Ground chicken or [place]. Pork with fat trimmed off. Fish and seafood. Egg whites. Dried beans, peas, or lentils. Unsalted nuts, nut butters, and seeds. Unsalted canned beans. Lean cuts of beef with fat trimmed off. Low-sodium, lean precooked or cured meat, such as sausages or meat loaves. Dairy Low-fat (1%) or fat-free (skim) milk. Reduced-fat, low-fat, or fat-free cheeses. Nonfat, low-sodium ricotta or cottage cheese. Low-fat or nonfat yogurt. Low-fat, low-sodium cheese. Fats and oils Soft margarine without trans fats. Vegetable oil. Reduced-fat, low-fat, or light mayonnaise and salad dressings (reduced-sodium). Canola, safflower, olive, avocado, soybean, and sunflower oils. Avocado. Seasonings and condiments Herbs. Spices. Seasoning mixes without salt. Other foods Unsalted popcorn and pretzels. Fat-free sweets. The items listed above may not be a complete list of foods and beverages you can eat. Contact a dietitian for more information. What foods should I avoid? Fruits Canned fruit in a light or heavy syrup. Fried fruit. Fruit in cream  or butter sauce. Vegetables Creamed or fried vegetables. Vegetables in a cheese sauce. Regular canned vegetables (not low-sodium or reduced-sodium). Regular canned tomato sauce and paste (not low-sodium or reduced-sodium). Regular tomato and vegetable juice (not low-sodium or reduced-sodium). Angie Fava. Olives. Grains Baked goods made with fat, such as croissants, muffins, or some breads. Dry pasta or rice meal packs. Meats and other proteins Fatty cuts of meat. Ribs. Fried meat. Berniece Salines. Bologna, salami, and other precooked or cured meats, such as sausages or meat loaves. Fat from the back of a pig (fatback). Bratwurst. Salted nuts and seeds. Canned beans with added salt. Canned or smoked fish. Whole eggs or egg yolks. Chicken or [place] with skin. Dairy Whole or 2% milk, cream, and half-and-half. Whole or full-fat cream cheese. Whole-fat or sweetened yogurt. Full-fat cheese. Nondairy creamers. Whipped toppings. Processed cheese and cheese spreads. Fats and oils Butter. Stick margarine. Lard. Shortening. Ghee. Bacon fat. Tropical oils, such as coconut, palm kernel, or palm oil. Seasonings and condiments Onion salt, garlic salt, seasoned salt, table salt, and sea salt. Worcestershire sauce. Tartar sauce. Barbecue sauce. Teriyaki sauce. Soy sauce, including reduced-sodium. Steak sauce. Canned and packaged gravies. Fish sauce. Oyster sauce. Cocktail sauce. Store-bought horseradish. Ketchup. Mustard.  Meat flavorings and tenderizers. Bouillon cubes. Hot sauces. Pre-made or packaged marinades. Pre-made or packaged taco seasonings. Relishes. Regular salad dressings. Other foods Salted popcorn and pretzels. The items listed above may not be a complete list of foods and beverages you should avoid. Contact a dietitian for more information. Where to find more information  National Heart, Lung, and Blood Institute: https://wilson-eaton.com/  American Heart Association: www.heart.org  Academy of Nutrition and  Dietetics: www.eatright.[place]: www.kidney.org Summary  The DASH eating plan is a healthy eating plan that has been shown to reduce high blood pressure (hypertension). It may also reduce your risk for type 2 diabetes, heart disease, and stroke.  When on the DASH eating plan, aim to eat more fresh fruits and vegetables, whole grains, lean proteins, low-fat dairy, and heart-healthy fats.  With the DASH eating plan, you should limit salt (sodium) intake to 2,300 mg a day. If you have hypertension, you may need to reduce your sodium intake to 1,500 mg a day.  Work with your health care provider or dietitian to adjust your eating plan to your individual calorie needs. This information is not intended to replace advice given to you by your health care provider. Make sure you discuss any questions you have with your health care provider. Document Revised: 09/03/2019 Document Reviewed: 09/03/2019 Elsevier Patient Education  2021 [employer].

## 2021-01-21 ENCOUNTER — Encounter: Payer: Self-pay | Admitting: Family Medicine

## 2021-01-21 DIAGNOSIS — G309 Alzheimer's disease, unspecified: Secondary | ICD-10-CM | POA: Insufficient documentation

## 2021-01-21 DIAGNOSIS — R030 Elevated blood-pressure reading, without diagnosis of hypertension: Secondary | ICD-10-CM | POA: Insufficient documentation

## 2021-01-21 DIAGNOSIS — I10 Essential (primary) hypertension: Secondary | ICD-10-CM | POA: Insufficient documentation

## 2021-01-21 DIAGNOSIS — Z8679 Personal history of other diseases of the circulatory system: Secondary | ICD-10-CM | POA: Insufficient documentation

## 2021-01-21 DIAGNOSIS — G3184 Mild cognitive impairment, so stated: Secondary | ICD-10-CM | POA: Insufficient documentation

## 2021-01-21 DIAGNOSIS — R413 Other amnesia: Secondary | ICD-10-CM | POA: Insufficient documentation

## 2021-01-21 DIAGNOSIS — F0390 Unspecified dementia without behavioral disturbance: Secondary | ICD-10-CM | POA: Insufficient documentation

## 2021-01-21 DIAGNOSIS — F02C0 Dementia in other diseases classified elsewhere, severe, without behavioral disturbance, psychotic disturbance, mood disturbance, and anxiety: Secondary | ICD-10-CM | POA: Insufficient documentation

## 2021-01-21 NOTE — Assessment & Plan Note (Signed)
Staying elevated, on repeat office visits. Pt denies h/o HTN. Provided with DASH diet, recommend low sodium diet, recommend buy BP cuff to start monitoring BP at home and let me know if consistently >140/90 to start medication. BP does improve on retesting today.

## 2021-01-22 ENCOUNTER — Encounter: Payer: Self-pay | Admitting: Family Medicine

## 2021-01-22 ENCOUNTER — Other Ambulatory Visit: Payer: Self-pay | Admitting: Family Medicine

## 2021-01-22 DIAGNOSIS — R911 Solitary pulmonary nodule: Secondary | ICD-10-CM | POA: Insufficient documentation

## 2021-01-22 DIAGNOSIS — E538 Deficiency of other specified B group vitamins: Secondary | ICD-10-CM | POA: Insufficient documentation

## 2021-01-22 LAB — RPR: RPR Ser Ql: NONREACTIVE

## 2021-01-22 NOTE — Assessment & Plan Note (Addendum)
Newly noted by family since lung cancer diagnosis.  Walking and talking fine. Predominant problem with short term memory. No significant fmhx dementia.  MMSE today with moderate amount of difficulty noted. Discussed this with pt and daughter. Reviewed healthy lifestyle choices to help keep mind healthy.  Will check head CT in cancer hx, will check labs for reversible causes of memory trouble.

## 2021-01-22 NOTE — Assessment & Plan Note (Signed)
Newly noted last month - planning short term f/u.

## 2021-01-22 NOTE — Assessment & Plan Note (Signed)
Appreciate thoracic surgery care.

## 2021-02-01 DIAGNOSIS — H2511 Age-related nuclear cataract, right eye: Secondary | ICD-10-CM | POA: Diagnosis not present

## 2021-02-05 ENCOUNTER — Other Ambulatory Visit: Payer: Self-pay

## 2021-02-05 ENCOUNTER — Ambulatory Visit
Admission: RE | Admit: 2021-02-05 | Discharge: 2021-02-05 | Disposition: A | Discharge status: Home / Self Care | Payer: Medicare HMO | Source: Ambulatory Visit | Attending: Family Medicine | Admitting: Family Medicine

## 2021-02-05 DIAGNOSIS — R413 Other amnesia: Secondary | ICD-10-CM

## 2021-02-07 ENCOUNTER — Ambulatory Visit (INDEPENDENT_AMBULATORY_CARE_PROVIDER_SITE_OTHER): Payer: Medicare HMO

## 2021-02-07 DIAGNOSIS — Z0000 Encounter for general adult medical examination without abnormal findings: Secondary | ICD-10-CM | POA: Diagnosis not present

## 2021-02-07 NOTE — Progress Notes (Signed)
Subjective:   Lindsay Mason is a 70 y.o. female who presents for Medicare Annual (Subsequent) preventive examination.  Review of Systems: N/A      I connected with the patient today by telephone and verified that I am speaking with the correct person using two identifiers. Location patient: home Location nurse: work Persons participating in the telephone visit: patient, nurse.   I discussed the limitations, risks, security and privacy concerns of performing an evaluation and management service by telephone and the availability of in person appointments. I also discussed with the patient that there may be a patient responsible charge related to this service. The patient expressed understanding and verbally consented to this telephonic visit.        Cardiac Risk Factors include: advanced age (>30men, >64 women)     Objective:    Today's Vitals   There is no height or weight on file to calculate BMI.  Advanced Directives 02/07/2021 01/20/2020 01/20/2020 01/18/2020 12/23/2019 12/22/2018 06/11/2017  Does Patient Have a Medical Advance Directive? Yes Yes Yes Yes No Yes Yes  Type of Paramedic of [place];Living will Living will Living will Living will - [place];Living will Living will  Does patient want to make changes to medical advance directive? - No - Patient declined - No - Patient declined - - -  Copy of [place] in Chart? No - copy requested - - - - No - copy requested -  Would patient like information on creating a medical advance directive? - - - - No - Patient declined - -  Pre-existing out of facility DNR order (yellow form or pink MOST form) - - - - - - -    Current Medications (verified) Outpatient Encounter Medications as of 02/07/2021  Medication Sig  . albuterol (VENTOLIN HFA) 108 (90 Base) MCG/ACT inhaler INHALE TWO PUFFS BY MOUTH INTO LUNGS EVERY 6 HOURS AS NEEDED FOR SHORTNESS OF BREATH OR wheezing  .  cyanocobalamin (,VITAMIN B-12,) 1000 MCG/ML injection Inject 1 mL (1,000 mcg total) into the muscle every 30 (thirty) days. Weekly for 4 weeks then monthly  . Multiple Vitamin (MULTIVITAMIN) tablet Take 1 tablet by mouth daily.   No facility-administered encounter medications on file as of 02/07/2021.    Allergies (verified) Penicillins   History: Past Medical History:  Diagnosis Date  . Adrenal hyperplasia ([place]) 12/2011   on CT  . Allergy   . CAP (community acquired pneumonia) 12/2011  . Chest pain    normal ETT 10/2011  . 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 Ex-smoker 08/2014   currently using e cig  . Microscopic colitis 06/2015   lymphocytic by colonoscopy - started entocort [name]  . Osteoporosis, unspecified   . Scoliosis of lumbar spine   . 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 COLONOSCOPY  06/2015   microscopic colitis [name]  . DEXA  10/2008   Osteoporosis  . DEXA  02/2014   T score osteopenia hip, osteoporosis spine, scoliosis  . ELECTROMAGNETIC NAVIGATION BROCHOSCOPY  01/20/2020   Procedure: ELECTROMAGNETIC NAVIGATION BRONCHOSCOPY;  Surgeon: [name], DO;  Location: [facility];  Service: Pulmonary;;  . ETT  10/2011   WNL, no evidence of ischemia, excellent exercise tolerance  . EXPLORATORY LAPAROTOMY    . INTERCOSTAL NERVE BLOCK Right 01/20/2020   Procedure: Intercostal Nerve Block;  Surgeon: [name], MD;  Location: [facility];  Service: Thoracic;  Laterality: Right;  . MRI head  07/2011   multiple foci deep and subcortical white matter, chronic ischemic vs demyelinating, no active disease  . NODE DISSECTION  01/20/2020   Procedure: Node Dissection;  Surgeon: [name], MD;  Location: [facility];  Service: Thoracic;;  . SUBMUCOSAL TATTOO INJECTION  01/20/2020   Procedure: SUBSTITIAL TATTOO INJECTION;  Surgeon: [name], DO;   Location: [facility];  Service: Pulmonary;;  . TONSILLECTOMY AND ADENOIDECTOMY    . Vein removal     removed from legs  . VIDEO ASSISTED THORACOSCOPY (VATS)/WEDGE RESECTION Right 01/20/2020   XI ROBOTIC ASSISTED THORASCOPY-RIGHT LOWER LOBE WEDGE RESECTION, RIGHT LOWER LOBECTOMYRightGeneral  . VIDEO BRONCHOSCOPY  01/20/2020   Procedure: VIDEO BRONCHOSCOPY WITH FLUORO;  Surgeon: [name], DO;  Location: [facility];  Service: Pulmonary;;   Family History  Problem Relation Age of Onset  . Osteoporosis Other        great grandmother  . Stroke Maternal Grandmother   . Coronary artery disease Maternal Grandfather 50  . Diabetes Maternal Grandfather   . Coronary artery disease Paternal Grandmother 80  . Coronary artery disease Paternal Grandfather 40  . Lung cancer Maternal Aunt    Social History   Socioeconomic History  . Marital status: Married    Spouse name: Not on file  . Number of children: 3  . Years of education: Not on file  . Highest education level: Not on file  Occupational History  . Occupation: retired  Tobacco Use  . Smoking status: Former Smoker    Packs/day: 1.00    Years: 30.00    Pack years: 30.00    Types: Cigarettes    Start date: 10/14/1978    Quit date: 07/15/2015    Years since quitting: 5.5  . Smokeless tobacco: Never Used  . Tobacco comment: off and on smoker, now e-cigarettes  Vaping Use  . Vaping Use: Some days  . Devices: uses occasionally - no nicotine  Substance and Sexual Activity  . Alcohol use: Yes    Alcohol/week: 0.0 standard drinks    Comment: social wine once a week  . Drug use: No  . Sexual activity: Not on file  Other Topics Concern  . Not on file  Social History Narrative   Caffeine: 1 cup coffee/day   Lives with husband, outside dog   Smoking vapes.   Pt tans regularly - at her pool and in her garden. Wears sunscreen   Social Determinants of Health   Financial Resource Strain: Low Risk   . Difficulty of Paying  Living Expenses: Not hard at all  Food Insecurity: No Food Insecurity  . Worried About Charity fundraiser in the Last Year: Never true  . Ran Out of Food in the Last Year: Never true  Transportation Needs: No Transportation Needs  . Lack of Transportation (Medical): No  . Lack of Transportation (Non-Medical): No  Physical Activity: Sufficiently Active  . Days of Exercise per Week: 7 days  . Minutes of Exercise per Session: 60 min  Stress: No Stress Concern Present  . Feeling of Stress : Not at all  Social Connections: Not on file    Tobacco Counseling Counseling given: Not Answered Comment: off and on smoker, now e-cigarettes   Clinical Intake:  Pre-visit preparation completed: Yes  Pain : No/denies pain     Nutritional Risks: None Diabetes: No  How often do  you need to have someone help you when you read instructions, pamphlets, or other written materials from your doctor or pharmacy?: 1 - Never  Diabetic: No Nutrition Risk Assessment:  Has the patient had any N/V/D within the last 2 months?  No  Does the patient have any non-healing wounds?  No  Has the patient had any unintentional weight loss or weight gain?  No   Diabetes:  Is the patient diabetic?  No  If diabetic, was a CBG obtained today?  N/A Did the patient bring in their glucometer from home?  N/A How often do you monitor your CBG's? N/A.   Financial Strains and Diabetes Management:  Are you having any financial strains with the device, your supplies or your medication? N/A.  Does the patient want to be seen by Chronic Care Management for management of their diabetes?  N/A Would the patient like to be referred to a Nutritionist or for Diabetic Management?  N/A   Interpreter Needed?: No  Information entered by :: CJohnson, LPN   Activities of Daily Living In your present state of health, do you have any difficulty performing the following activities: 02/07/2021  Hearing? N  Vision? N   Difficulty concentrating or making decisions? N  Walking or climbing stairs? N  Dressing or bathing? N  Doing errands, shopping? N  Preparing Food and eating ? N  Using the Toilet? N  In the past six months, have you accidently leaked urine? N  Do you have problems with loss of bowel control? N  Managing your Medications? N  Managing your Finances? N  Housekeeping or managing your Housekeeping? N  Some recent data might be hidden    Patient Care Team: Ria Bush, MD as PCP - General (Family Medicine)  Indicate any recent Medical Services you may have received from other than [facility] providers in the past year (date may be approximate).     Assessment:   This is a routine wellness examination for Mistee.  Hearing/Vision screen  Hearing Screening   125Hz  250Hz  500Hz  1000Hz  2000Hz  3000Hz  4000Hz  6000Hz  8000Hz   Right ear:           Left ear:           Vision Screening Comments: Patient gets annual eye exams   Dietary issues and exercise activities discussed: Current Exercise Habits: Home exercise routine, Type of exercise: walking, Time (Minutes): 60, Frequency (Times/Week): 7, Weekly Exercise (Minutes/Week): 420, Intensity: Moderate, Exercise limited by: None identified  Goals    . Increase physical activity     Starting 12/22/2018, I will continue to walk at least 35 minutes daily.     . Patient Stated     12/23/2019, I will continue to walk everyday for 1 hour.     . Patient Stated     02/07/2021, I will continue to walk everyday for 1 hour.       Depression Screen PHQ 2/9 Scores 02/07/2021 01/19/2021 12/23/2019 12/22/2018 06/11/2017  PHQ - 2 Score 0 0 0 0 0  PHQ- 9 Score 0 2 0 0 0    Fall Risk Fall Risk  02/07/2021 12/23/2019 12/22/2018 06/11/2017  Falls in the past year? 0 1 0 No  Comment - fell down the steps - -  Number falls in past yr: 0 0 - -  Injury with Fall? 0 0 - -  Risk for fall due to : No Fall Risks No Fall Risks - -  Follow up Falls evaluation  completed;Falls prevention  discussed Falls evaluation completed;Falls prevention discussed - -    FALL RISK PREVENTION PERTAINING TO THE HOME:  Any stairs in or around the home? Yes  If so, are there any without handrails? No  Home free of loose throw rugs in walkways, pet beds, electrical cords, etc? Yes  Adequate lighting in your home to reduce risk of falls? Yes   ASSISTIVE DEVICES UTILIZED TO PREVENT FALLS:  Life alert? No  Use of a cane, walker or w/c? No  Grab bars in the bathroom? No  Shower chair or bench in shower? No  Elevated toilet seat or a handicapped toilet? No   TIMED UP AND GO:  Was the test performed? N/A telephone visit .    Cognitive Function: MMSE - Mini Mental State Exam 02/07/2021 12/23/2019 12/22/2018 06/11/2017  Not completed: Refused - - -  Orientation to time - 5 5 5   Orientation to Place - 5 5 5   Registration - 3 3 3   Attention/ Calculation - 5 0 0  Recall - 3 0 3  Recall-comments - - unable to recall 3 of 3 words -  Language- name 2 objects - - 0 0  Language- repeat - 1 1 1   Language- follow 3 step command - - 3 3  Language- read & follow direction - - 0 0  Write a sentence - - 0 0  Copy design - - 0 0  Total score - - 17 20  Mini Cog  Mini-Cog screen was not completed. Patient refused. Maximum score is 22. A value of 0 denotes this part of the MMSE was not completed or the patient failed this part of the Mini-Cog screening.       Immunizations Immunization History  Administered Date(s) Administered  . Fluad Quad(high Dose 65+) 08/07/2020  . Influenza Whole 07/19/2008, 08/24/2012  . Influenza, High Dose Seasonal PF 08/11/2018, 06/04/2019  . Influenza, Seasonal, Injecte, Preservative Fre 07/13/2013  . Influenza,inj,Quad PF,6+ Mos 08/22/2015, 06/26/2017  . Influenza-Unspecified 08/16/2016  . PFIZER(Purple Top)SARS-COV-2 Vaccination 12/13/2019, 01/11/2020  . Pneumococcal Conjugate-13 06/11/2017  . Pneumococcal Polysaccharide-23  01/01/2012, 02/26/2019  . Td 04/04/2004  . Tdap 02/01/2016  . Zoster 10/13/2013    TDAP status: Up to date  Flu Vaccine status: Up to date  Pneumococcal vaccine status: Up to date  Covid-19 vaccine status: Completed vaccines  Qualifies for Shingles Vaccine? Yes   Zostavax completed Yes   Shingrix Completed?: No.    Education has been provided regarding the importance of this vaccine. Patient has been advised to call insurance company to determine out of pocket expense if they have not yet received this vaccine. Advised may also receive vaccine at local pharmacy or Health Dept. Verbalized acceptance and understanding.  Screening Tests Health Maintenance  Topic Date Due  . MAMMOGRAM  06/26/2019  . COVID-19 Vaccine (3 - Pfizer risk 4-dose series) 02/08/2020  . INFLUENZA VACCINE  05/14/2021  . COLONOSCOPY (Pts 45-38yrs Insurance coverage will need to be confirmed)  06/26/2025  . TETANUS/TDAP  01/31/2026  . DEXA SCAN  Completed  . Hepatitis C Screening  Completed  . PNA vac Low Risk Adult  Completed  . HPV VACCINES  Aged Out    Health Maintenance  Health Maintenance Due  Topic Date Due  . MAMMOGRAM  06/26/2019  . COVID-19 Vaccine (3 - Pfizer risk 4-dose series) 02/08/2020    Colorectal cancer screening: Type of screening: Colonoscopy. Completed 06/27/2015. Repeat every 10 years  Mammogram status: due, will discuss with provider  Bone Density status: due, will discuss with provider   Lung Cancer Screening: (Low Dose CT Chest recommended if Age 69-80 years, 30 pack-year currently smoking OR have quit w/in 15years.) does qualify.   Lung Cancer Screening Referral: completed 12/29/2020  Additional Screening:  Hepatitis C Screening: does qualify; Completed 01/25/2016  Vision Screening: Recommended annual ophthalmology exams for early detection of glaucoma and other disorders of the eye. Is the patient up to date with their annual eye exam?  Yes  Who is the provider or what  is the name of the office in which the patient attends annual eye exams? [facility] If pt is not established with a provider, would they like to be referred to a provider to establish care? No .   Dental Screening: Recommended annual dental exams for proper oral hygiene  Community Resource Referral / Chronic Care Management: CRR required this visit?  No   CCM required this visit?  No      Plan:     I have personally reviewed and noted the following in the patient's chart:   . Medical and social history . Use of alcohol, tobacco or illicit drugs  . Current medications and supplements . Functional ability and status . Nutritional status . Physical activity . Advanced directives . List of other physicians . Hospitalizations, surgeries, and ER visits in previous 12 months . Vitals . Screenings to include cognitive, depression, and falls . Referrals and appointments  In addition, I have reviewed and discussed with patient certain preventive protocols, quality metrics, and best practice recommendations. A written personalized care plan for preventive services as well as general preventive health recommendations were provided to patient.   Due to this being a telephonic visit, the after visit summary with patients personalized plan was offered to patient via office or my-chart. Patient preferred to pick up at office at next visit or via mychart.   Lindsay Grime, LPN   12/05/9796

## 2021-02-07 NOTE — Patient Instructions (Signed)
[name] , Thank you for taking time to come for your Medicare Wellness Visit. I appreciate your ongoing commitment to your health goals. Please review the following plan we discussed and let me know if I can assist you in the future.   Screening recommendations/referrals: Colonoscopy: Up to date, completed 06/27/2015, due 06/2025 Mammogram: due, will discuss with provider  Bone Density: due, will discuss with provider  Recommended yearly ophthalmology/optometry visit for glaucoma screening and checkup Recommended yearly dental visit for hygiene and checkup  Vaccinations: Influenza vaccine: Up to date, completed 08/07/2020, due 05/2021 Pneumococcal vaccine: Completed series Tdap vaccine: Up to date, completed 02/01/2016, due 01/2026 Shingles vaccine: due, check with your insurance regarding coverage if interested    Covid-19:Completed series, Please bring card so we can document in your chart  Advanced directives: Please bring a copy of your POA (Power of [place]) and/or Living Will to your next appointment.   Conditions/risks identified: none  Next appointment: Follow up in one year for your annual wellness visit    Preventive Care 65 Years and Older, Female Preventive care refers to lifestyle choices and visits with your health care provider that can promote health and wellness. What does preventive care include?  A yearly physical exam. This is also called an annual well check.  Dental exams once or twice a year.  Routine eye exams. Ask your health care provider how often you should have your eyes checked.  Personal lifestyle choices, including:  Daily care of your teeth and gums.  Regular physical activity.  Eating a healthy diet.  Avoiding tobacco and drug use.  Limiting alcohol use.  Practicing safe sex.  Taking low-dose aspirin every day.  Taking vitamin and mineral supplements as recommended by your health care provider. What happens during an annual well  check? The services and screenings done by your health care provider during your annual well check will depend on your age, overall health, lifestyle risk factors, and family history of disease. Counseling  Your health care provider may ask you questions about your:  Alcohol use.  Tobacco use.  Drug use.  Emotional well-being.  Home and relationship well-being.  Sexual activity.  Eating habits.  History of falls.  Memory and ability to understand (cognition).  Work and work Statistician.  Reproductive health. Screening  You may have the following tests or measurements:  Height, weight, and BMI.  Blood pressure.  Lipid and cholesterol levels. These may be checked every 5 years, or more frequently if you are over 48 years old.  Skin check.  Lung cancer screening. You may have this screening every year starting at age 70 if you have a 30-pack-year history of smoking and currently smoke or have quit within the past 15 years.  Fecal occult blood test (FOBT) of the stool. You may have this test every year starting at age 11.  Flexible sigmoidoscopy or colonoscopy. You may have a sigmoidoscopy every 5 years or a colonoscopy every 10 years starting at age 38.  Hepatitis C blood test.  Hepatitis B blood test.  Sexually transmitted disease (STD) testing.  Diabetes screening. This is done by checking your blood sugar (glucose) after you have not eaten for a while (fasting). You may have this done every 1-3 years.  Bone density scan. This is done to screen for osteoporosis. You may have this done starting at age 62.  Mammogram. This may be done every 1-2 years. Talk to your health care provider about how often you should have  regular mammograms. Talk with your health care provider about your test results, treatment options, and if necessary, the need for more tests. Vaccines  Your health care provider may recommend certain vaccines, such as:  Influenza vaccine. This is  recommended every year.  Tetanus, diphtheria, and acellular pertussis (Tdap, Td) vaccine. You may need a Td booster every 10 years.  Zoster vaccine. You may need this after age 28.  Pneumococcal 13-valent conjugate (PCV13) vaccine. One dose is recommended after age 55.  Pneumococcal polysaccharide (PPSV23) vaccine. One dose is recommended after age 57. Talk to your health care provider about which screenings and vaccines you need and how often you need them. This information is not intended to replace advice given to you by your health care provider. Make sure you discuss any questions you have with your health care provider. Document Released: 10/27/2015 Document Revised: 06/19/2016 Document Reviewed: 08/01/2015 Elsevier Interactive Patient Education  2017 [place] Prevention in the Home Falls can cause injuries. They can happen to people of all ages. There are many things you can do to make your home safe and to help prevent falls. What can I do on the outside of my home?  Regularly fix the edges of walkways and driveways and fix any cracks.  Remove anything that might make you trip as you walk through a door, such as a raised step or threshold.  Trim any bushes or trees on the path to your home.  Use bright outdoor lighting.  Clear any walking paths of anything that might make someone trip, such as rocks or tools.  Regularly check to see if handrails are loose or broken. Make sure that both sides of any steps have handrails.  Any raised decks and porches should have guardrails on the edges.  Have any leaves, snow, or ice cleared regularly.  Use sand or salt on walking paths during winter.  Clean up any spills in your garage right away. This includes oil or grease spills. What can I do in the bathroom?  Use night lights.  Install grab bars by the toilet and in the tub and shower. Do not use towel bars as grab bars.  Use non-skid mats or decals in the tub or  shower.  If you need to sit down in the shower, use a plastic, non-slip stool.  Keep the floor dry. Clean up any water that spills on the floor as soon as it happens.  Remove soap buildup in the tub or shower regularly.  Attach bath mats securely with double-sided non-slip rug tape.  Do not have throw rugs and other things on the floor that can make you trip. What can I do in the bedroom?  Use night lights.  Make sure that you have a light by your bed that is easy to reach.  Do not use any sheets or blankets that are too big for your bed. They should not hang down onto the floor.  Have a firm chair that has side arms. You can use this for support while you get dressed.  Do not have throw rugs and other things on the floor that can make you trip. What can I do in the kitchen?  Clean up any spills right away.  Avoid walking on wet floors.  Keep items that you use a lot in easy-to-reach places.  If you need to reach something above you, use a strong step stool that has a grab bar.  Keep electrical cords out of the  way.  Do not use floor polish or wax that makes floors slippery. If you must use wax, use non-skid floor wax.  Do not have throw rugs and other things on the floor that can make you trip. What can I do with my stairs?  Do not leave any items on the stairs.  Make sure that there are handrails on both sides of the stairs and use them. Fix handrails that are broken or loose. Make sure that handrails are as long as the stairways.  Check any carpeting to make sure that it is firmly attached to the stairs. Fix any carpet that is loose or worn.  Avoid having throw rugs at the top or bottom of the stairs. If you do have throw rugs, attach them to the floor with carpet tape.  Make sure that you have a light switch at the top of the stairs and the bottom of the stairs. If you do not have them, ask someone to add them for you. What else can I do to help prevent  falls?  Wear shoes that:  Do not have high heels.  Have rubber bottoms.  Are comfortable and fit you well.  Are closed at the toe. Do not wear sandals.  If you use a stepladder:  Make sure that it is fully opened. Do not climb a closed stepladder.  Make sure that both sides of the stepladder are locked into place.  Ask someone to hold it for you, if possible.  Clearly mark and make sure that you can see:  Any grab bars or handrails.  First and last steps.  Where the edge of each step is.  Use tools that help you move around (mobility aids) if they are needed. These include:  Canes.  Walkers.  Scooters.  Crutches.  Turn on the lights when you go into a dark area. Replace any light bulbs as soon as they burn out.  Set up your furniture so you have a clear path. Avoid moving your furniture around.  If any of your floors are uneven, fix them.  If there are any pets around you, be aware of where they are.  Review your medicines with your doctor. Some medicines can make you feel dizzy. This can increase your chance of falling. Ask your doctor what other things that you can do to help prevent falls. This information is not intended to replace advice given to you by your health care provider. Make sure you discuss any questions you have with your health care provider. Document Released: 07/27/2009 Document Revised: 03/07/2016 Document Reviewed: 11/04/2014 Elsevier Interactive Patient Education  2017 [employer].

## 2021-02-07 NOTE — Progress Notes (Signed)
PCP notes:  Health Maintenance: Mammogram- due Dexa- due   Abnormal Screenings: none   Patient concerns: Spot on her chest    Nurse concerns: none   Next PCP appt.: 02/14/2021 @ 2:30 pm

## 2021-02-08 ENCOUNTER — Other Ambulatory Visit: Payer: Self-pay

## 2021-02-08 ENCOUNTER — Other Ambulatory Visit: Payer: Self-pay | Admitting: Family Medicine

## 2021-02-08 ENCOUNTER — Other Ambulatory Visit (INDEPENDENT_AMBULATORY_CARE_PROVIDER_SITE_OTHER): Payer: Medicare HMO

## 2021-02-08 DIAGNOSIS — Z13220 Encounter for screening for lipoid disorders: Secondary | ICD-10-CM

## 2021-02-08 DIAGNOSIS — E538 Deficiency of other specified B group vitamins: Secondary | ICD-10-CM

## 2021-02-08 LAB — LIPID PANEL
Cholesterol: 190 mg/dL (ref 0–200)
HDL: 90.9 mg/dL (ref >39.00)
LDL Cholesterol: 85 mg/dL (ref 0–99)
NonHDL: 99.02
Total CHOL/HDL Ratio: 2
Triglycerides: 71 mg/dL (ref 0.0–149.0)
VLDL: 14.2 mg/dL (ref 0.0–40.0)

## 2021-02-08 LAB — VITAMIN B12: Vitamin B-12: 823 pg/mL (ref 211–911)

## 2021-02-14 ENCOUNTER — Ambulatory Visit (INDEPENDENT_AMBULATORY_CARE_PROVIDER_SITE_OTHER): Payer: Medicare HMO | Admitting: Family Medicine

## 2021-02-14 ENCOUNTER — Encounter: Payer: Self-pay | Admitting: Family Medicine

## 2021-02-14 ENCOUNTER — Other Ambulatory Visit: Payer: Self-pay

## 2021-02-14 ENCOUNTER — Encounter: Payer: Self-pay | Admitting: Ophthalmology

## 2021-02-14 VITALS — BP 160/106 | HR 76 | Temp 97.7°F | Ht 60.5 in | Wt 140.4 lb

## 2021-02-14 DIAGNOSIS — Z902 Acquired absence of lung [part of]: Secondary | ICD-10-CM | POA: Diagnosis not present

## 2021-02-14 DIAGNOSIS — I10 Essential (primary) hypertension: Secondary | ICD-10-CM

## 2021-02-14 DIAGNOSIS — E538 Deficiency of other specified B group vitamins: Secondary | ICD-10-CM

## 2021-02-14 DIAGNOSIS — L989 Disorder of the skin and subcutaneous tissue, unspecified: Secondary | ICD-10-CM | POA: Insufficient documentation

## 2021-02-14 DIAGNOSIS — I700 Atherosclerosis of aorta: Secondary | ICD-10-CM

## 2021-02-14 DIAGNOSIS — J439 Emphysema, unspecified: Secondary | ICD-10-CM

## 2021-02-14 DIAGNOSIS — M85851 Other specified disorders of bone density and structure, right thigh: Secondary | ICD-10-CM | POA: Diagnosis not present

## 2021-02-14 DIAGNOSIS — Z7189 Other specified counseling: Secondary | ICD-10-CM | POA: Diagnosis not present

## 2021-02-14 DIAGNOSIS — Z0000 Encounter for general adult medical examination without abnormal findings: Secondary | ICD-10-CM | POA: Diagnosis not present

## 2021-02-14 DIAGNOSIS — R413 Other amnesia: Secondary | ICD-10-CM

## 2021-02-14 DIAGNOSIS — R911 Solitary pulmonary nodule: Secondary | ICD-10-CM

## 2021-02-14 MED ORDER — VITAMIN B-12 1000 MCG PO TABS: 1000.0000 ug | ORAL_TABLET | Freq: Every day | ORAL | Status: DC

## 2021-02-14 MED ORDER — AMLODIPINE BESYLATE 5 MG PO TABS: 5.0000 mg | ORAL_TABLET | Freq: Every day | ORAL | 3 refills | Status: DC

## 2021-02-14 MED ORDER — CYANOCOBALAMIN 1000 MCG/ML IJ SOLN
1000.0000 ug | Freq: Once | INTRAMUSCULAR | Status: AC
  Administered 2021-02-14: 1000 ug via INTRAMUSCULAR

## 2021-02-14 NOTE — Assessment & Plan Note (Signed)
By CT, however pt asxs off respiratory medication.

## 2021-02-14 NOTE — Assessment & Plan Note (Signed)
Followed by thoracic surgery - planning to monitor and repeat imaging in September 2022.

## 2021-02-14 NOTE — Assessment & Plan Note (Signed)
Firm skin nodule present on right chest wall for several months - will refer to derm.

## 2021-02-14 NOTE — Assessment & Plan Note (Addendum)
Preventative protocols reviewed and updated unless pt declined. Discussed healthy diet and lifestyle.  Encouraged she call to schedule mammogram.

## 2021-02-14 NOTE — Assessment & Plan Note (Signed)
BP remaining elevated.  Start amlodipine 5mg  daily. Reviewed side effects to monitor for including pedal edema.  RTC 3-4 mo f/u HTN.

## 2021-02-14 NOTE — Assessment & Plan Note (Signed)
Reviewed last DEXA = osteopenia, recommend rpt next year.  Reviewed regular weight bearing exercise as well as cal/vit D intake.  They will check on MVI vit D dosing and if <1000 IU, start extra 1000 IU daily.

## 2021-02-14 NOTE — Patient Instructions (Addendum)
Vitamin B12 shot today then continue daily oral B12 replacement at home.  Blood pressures are staying elevated - start monitoring at home - your goal is <140/90. Start amlodipine 5mg  daily new blood pressure medicine.  Call to schedule mammogram at your convenience: [place] [phone].  Goal vitamin D daily is 1000 units. Goal calcium intake is 1200mg  daily.  Check on date of booster and let me know.  If interested, check with pharmacy about new 2 shot shingles series (shingrix).  We will refer you to skin doctor in [place].  Bring [place] copy of your advanced directives to update your chart.  Return as needed or in 3-4 months for blood pressure follow up visit.   Health Maintenance After Age 59 After age 42, you are at a higher risk for certain long-term diseases and infections as well as injuries from falls. Falls are a major cause of broken bones and head injuries in people who are older than age 8. Getting regular preventive care can help to keep you healthy and well. Preventive care includes getting regular testing and making lifestyle changes as recommended by your health care provider. Talk with your health care provider about:  Which screenings and tests you should have. A screening is a test that checks for a disease when you have no symptoms.  A diet and exercise plan that is right for you. What should I know about screenings and tests to prevent falls? Screening and testing are the best ways to find a health problem early. Early diagnosis and treatment give you the best chance of managing medical conditions that are common after age 2. Certain conditions and lifestyle choices may make you more likely to have a fall. Your health care provider may recommend:  Regular vision checks. Poor vision and conditions such as cataracts can make you more likely to have a fall. If you wear glasses, make sure to get your prescription updated if your vision changes.  Medicine  review. Work with your health care provider to regularly review all of the medicines you are taking, including over-the-counter medicines. Ask your health care provider about any side effects that may make you more likely to have a fall. Tell your health care provider if any medicines that you take make you feel dizzy or sleepy.  Osteoporosis screening. Osteoporosis is a condition that causes the bones to get weaker. This can make the bones weak and cause them to break more easily.  Blood pressure screening. Blood pressure changes and medicines to control blood pressure can make you feel dizzy.  Strength and balance checks. Your health care provider may recommend certain tests to check your strength and balance while standing, walking, or changing positions.  Foot health exam. Foot pain and numbness, as well as not wearing proper footwear, can make you more likely to have a fall.  Depression screening. You may be more likely to have a fall if you have a fear of falling, feel emotionally low, or feel unable to do activities that you used to do.  Alcohol use screening. Using too much alcohol can affect your balance and may make you more likely to have a fall. What actions can I take to lower my risk of falls? General instructions  Talk with your health care provider about your risks for falling. Tell your health care provider if: ? You fall. Be sure to tell your health care provider about all falls, even ones that seem minor. ? You feel dizzy,  sleepy, or off-balance.  Take over-the-counter and prescription medicines only as told by your health care provider. These include any supplements.  Eat a healthy diet and maintain a healthy weight. A healthy diet includes low-fat dairy products, low-fat (lean) meats, and fiber from whole grains, beans, and lots of fruits and vegetables. Home safety  Remove any tripping hazards, such as rugs, cords, and clutter.  Install safety equipment such as grab  bars in bathrooms and safety rails on stairs.  Keep rooms and walkways well-lit. Activity  Follow a regular exercise program to stay fit. This will help you maintain your balance. Ask your health care provider what types of exercise are appropriate for you.  If you need a cane or walker, use it as recommended by your health care provider.  Wear supportive shoes that have nonskid soles.   Lifestyle  Do not drink alcohol if your health care provider tells you not to drink.  If you drink alcohol, limit how much you have: ? 0-1 drink a day for women. ? 0-2 drinks a day for men.  Be aware of how much alcohol is in your drink. In the [place] one drink equals one typical bottle of beer (12 oz), one-half glass of wine (5 oz), or one shot of hard liquor (1 oz).  Do not use any products that contain nicotine or tobacco, such as cigarettes and e-cigarettes. If you need help quitting, ask your health care provider. Summary  Having a healthy lifestyle and getting preventive care can help to protect your health and wellness after age 45.  Screening and testing are the best way to find a health problem early and help you avoid having a fall. Early diagnosis and treatment give you the best chance for managing medical conditions that are more common for people who are older than age 55.  Falls are a major cause of broken bones and head injuries in people who are older than age 80. Take precautions to prevent a fall at home.  Work with your health care provider to learn what changes you can make to improve your health and wellness and to prevent falls. This information is not intended to replace advice given to you by your health care provider. Make sure you discuss any questions you have with your health care provider. Document Revised: 01/21/2019 Document Reviewed: 08/13/2017 Elsevier Patient Education  2021 [employer].

## 2021-02-14 NOTE — Assessment & Plan Note (Signed)
Advanced directive discussion - she has this at home. Asked to bring [place] copy. Husband [name] then daughter Leafy Ro would be HCPOA.

## 2021-02-14 NOTE — Assessment & Plan Note (Addendum)
Newly noted (baseline level 116 01/2021) They have been regular with 105mcg daily replacement with improvement in levels on recheck. Will provide vit B12 shot today, then continue daily oral replacement. This could contribute to memory difficulties, too soon to tell if memory issues will benefit with replacement.

## 2021-02-14 NOTE — Progress Notes (Addendum)
Patient ID: Lindsay Mason, female    DOB: 1951/04/22, 70 y.o.   MRN: [id number]  This visit was conducted in person.  BP (!) 160/106 (BP Location: Right Arm, Cuff Size: Normal)   Pulse 76   Temp 97.7 F (36.5 C) (Temporal)   Ht 5' 0.5" (1.537 m)   Wt 140 lb 6 oz (63.7 kg)   SpO2 98%   BMI 26.96 kg/m   BP Readings from Last 3 Encounters:  02/14/21 (!) 160/106  01/19/21 (!) 140/100  12/29/20 (!) 160/80   CC: CPE  Subjective:   HPI: Lindsay Mason is a 70 y.o. female presenting on 02/14/2021 for Annual Exam (Prt 2.  Pt accompanied by daughter, Lindsay Ro- temp 98.3.)   Saw health advisor last week for medicare wellness visit. Note reviewed.    No exam data present  Flowsheet Row Clinical Support from 02/07/2021 in [facility] at [facility]  PHQ-2 Total Score 0      Fall Risk  02/07/2021 12/23/2019 12/22/2018 06/11/2017  Falls in the past year? 0 1 0 No  Comment - fell down the steps - -  Number falls in past yr: 0 0 - -  Injury with Fall? 0 0 - -  Risk for fall due to : No Fall Risks No Fall Risks - -  Follow up Falls evaluation completed;Falls prevention discussed Falls evaluation completed;Falls prevention discussed - -    Seen here last month for evaluation of progressive short term memory difficulties predominantly noted by family, worse since cancer diagnosis/treatment. Prevagen trial didn't help.  Last visit Mental Status Exam: 17/29, 19/29 with cue - main trouble with orientation and recall. Overall labs and head CT stable. Found to have markedly low vitamin B12 levels (116) - has started 1036mcg tablets daily.  Head CT impression: No evidence of acute intracranial abnormality. 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 Electronically Signed   By: Kellie Simmering DO   On: 02/06/2021 10:06  BP remaining elevated - does not regularly check at home.   Spot on  right chest present for months initially larger scaly lesion fell off and small firm nodule persists.   Preventative: COLONOSCOPY Date: 06/2015 microscopic colitis [name]. No blood in stool or bowel changes recently.  Well woman exam - last well woman exam with [name] 02/2016 - WNL. Discussed aging out.  Breast cancer screening - last mammogram 06/2017 [facility] @ [facility] center - overdue for f/u DEXA 06/2017 - hip -2.2, forearm -1.9. Scoliosis. Good calcium in diet. Avoids dairy but eats greek yogurt. Last vit D 72 (2012). Regularly gets weight bearing exercises - walks 1-2 hours/day.  Lung cancer screening - known lung cancer s/p excision - monitored regularly by  Flu shot yearly [place] 12/2019 x2, booster 09/2020.  Tetanus shot - 2005, Tdap 2017 Pneumovax 12/2011, prevnar 05/2017.  zostavax - 2014 shingrix - discussed - to check at pharmacy Advanced directive discussion - she has this at home. Asked to bring [place] copy. Husband Lindsay Mason then daughter Lindsay Ro would be HCPOA.  Seat belt use discussed Sunscreen use discussed. Would like spot on chest checked - has not seen derm.  Ex smoker - quit 07/2015!  Alcohol - rare Dentist q6 mo  Eye exam yearly  Bowels - no constipation Bladder - no incontinence   Caffeine: 1 cup coffee/day  Lives with husband, outside dog  Activity: Very active at her  pool and in her garden. Walking 1-2 hours/day.  Diet: good water, fruits/vegetables daily, greek yogurt     Relevant past medical, surgical, family and social history reviewed and updated as indicated. Interim medical history since our last visit reviewed. Allergies and medications reviewed and updated. Outpatient Medications Prior to Visit  Medication Sig Dispense Refill  . albuterol (VENTOLIN HFA) 108 (90 Base) MCG/ACT inhaler INHALE TWO PUFFS BY MOUTH INTO LUNGS EVERY 6 HOURS AS NEEDED FOR SHORTNESS OF BREATH OR wheezing 8.5 g 0  . Multiple Vitamin (MULTIVITAMIN) tablet Take 1 tablet by  mouth daily.    . cyanocobalamin (,VITAMIN B-12,) 1000 MCG/ML injection Inject 1 mL (1,000 mcg total) into the muscle every 30 (thirty) days. Weekly for 4 weeks then monthly (Patient not taking: Reported on 02/14/2021) 1 mL 0   No facility-administered medications prior to visit.     Per HPI unless specifically indicated in ROS section below Review of Systems  Constitutional: Negative for activity change, appetite change, chills, fatigue, fever and unexpected weight change.  HENT: Negative for hearing loss.   Eyes: Negative for visual disturbance.  Respiratory: Negative for cough, chest tightness, shortness of breath and wheezing.   Cardiovascular: Negative for chest pain, palpitations and leg swelling.  Gastrointestinal: Negative for abdominal distention, abdominal pain, blood in stool, constipation, diarrhea, nausea and vomiting.  Genitourinary: Negative for difficulty urinating and hematuria.  Musculoskeletal: Negative for arthralgias, myalgias and neck pain.  Skin: Negative for rash.  Neurological: Negative for dizziness, seizures, syncope and headaches.  Hematological: Negative for adenopathy. Does not bruise/bleed easily.  Psychiatric/Behavioral: Negative for dysphoric mood. The patient is not nervous/anxious.    Objective:  BP (!) 160/106 (BP Location: Right Arm, Cuff Size: Normal)   Pulse 76   Temp 97.7 F (36.5 C) (Temporal)   Ht 5' 0.5" (1.537 m)   Wt 140 lb 6 oz (63.7 kg)   SpO2 98%   BMI 26.96 kg/m   Wt Readings from Last 3 Encounters:  02/14/21 140 lb 6 oz (63.7 kg)  01/19/21 142 lb 8 oz (64.6 kg)  12/29/20 140 lb (63.5 kg)      Physical Exam Vitals and nursing note reviewed.  Constitutional:      General: She is not in acute distress.    Appearance: Normal appearance. She is well-developed. She is not ill-appearing.  HENT:     Head: Normocephalic and atraumatic.     Right Ear: Hearing, tympanic membrane, ear canal and external ear normal.     Left Ear:  Hearing, tympanic membrane, ear canal and external ear normal.  Eyes:     General: No scleral icterus.    Extraocular Movements: Extraocular movements intact.     Conjunctiva/sclera: Conjunctivae normal.     Pupils: Pupils are equal, round, and reactive to light.  Neck:     Thyroid: No thyroid mass or thyromegaly.     Vascular: No carotid bruit.  Cardiovascular:     Rate and Rhythm: Normal rate and regular rhythm.     Pulses: Normal pulses.          Radial pulses are 2+ on the right side and 2+ on the left side.     Heart sounds: Normal heart sounds. No murmur heard.   Pulmonary:     Effort: Pulmonary effort is normal. No respiratory distress.     Breath sounds: Normal breath sounds. No wheezing, rhonchi or rales.  Chest:       Comments: Firm flesh colored nodule present  to R upper chest wall Abdominal:     General: Bowel sounds are normal. There is no distension.     Palpations: Abdomen is soft. There is no mass.     Tenderness: There is no abdominal tenderness. There is no guarding or rebound.     Hernia: No hernia is present.  Musculoskeletal:        General: Normal range of motion.     Cervical back: Normal range of motion and neck supple.     Right lower leg: No edema.     Left lower leg: No edema.  Lymphadenopathy:     Cervical: No cervical adenopathy.  Skin:    General: Skin is warm and dry.     Findings: No rash.  Neurological:     General: No focal deficit present.     Mental Status: She is alert and oriented to person, place, and time.     Comments: CN grossly intact, station and gait intact  Psychiatric:        Mood and Affect: Mood normal.        Behavior: Behavior normal.        Thought Content: Thought content normal.        Judgment: Judgment normal.       Results for orders placed or performed in visit on 02/08/21  Vitamin B12  Result Value Ref Range   Vitamin B-12 823 211 - 911 pg/mL  Lipid panel  Result Value Ref Range   Cholesterol 190 0 -  200 mg/dL   Triglycerides 71.0 0.0 - 149.0 mg/dL   HDL 90.90 >39.00 mg/dL   VLDL 14.2 0.0 - 40.0 mg/dL   LDL Cholesterol 85 0 - 99 mg/dL   Total CHOL/HDL Ratio 2    NonHDL 99.02     Assessment & Plan:  This visit occurred during the SARS-CoV-2 public health emergency.  Safety protocols were in place, including screening questions prior to the visit, additional usage of staff PPE, and extensive cleaning of exam room while observing appropriate contact time as indicated for disinfecting solutions.   Problem List Items Addressed This Visit    Osteopenia    Reviewed last DEXA = osteopenia, recommend rpt next year.  Reviewed regular weight bearing exercise as well as cal/vit D intake.  They will check on MVI vit D dosing and if <1000 IU, start extra 1000 IU daily.       Health maintenance examination - Primary    Preventative protocols reviewed and updated unless pt declined. Discussed healthy diet and lifestyle.  Encouraged she call to schedule mammogram.       COPD with emphysema ([place])    By CT, however pt asxs off respiratory medication.       Thoracic aortic atherosclerosis ([place])    Not on aspirin or statin.      Relevant Medications   amLODipine (NORVASC) 5 MG tablet   Advanced care planning/counseling discussion    Advanced directive discussion - she has this at home. Asked to bring [place] copy. Husband Lindsay Mason then daughter Lindsay Ro would be HCPOA.       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       Hypertension    BP remaining elevated.  Start amlodipine 5mg  daily. Reviewed side effects to monitor for including pedal edema.  RTC 3-4 mo f/u HTN.       Relevant Medications   amLODipine (NORVASC) 5 MG tablet   Memory  deficit    Recently found low B12 - will replete and reassess.  Head CT showed mild-mod cerebral atrophy with chronic ischemic changes.  Recommend rpt MMSE in 3-4 months after several months of B12 replacement. If no  improvement, consider further treatment. If progression, low threshold to refer to neurology.  Previously reviewed lifestyle choices for brain health.       Relevant Orders   Vitamin B1   Pulmonary nodule, left    Followed by thoracic surgery - planning to monitor and repeat imaging in September 2022.       Vitamin B12 deficiency    Newly noted (baseline level 116 01/2021) They have been regular with 1045mcg daily replacement with improvement in levels on recheck. Will provide vit B12 shot today, then continue daily oral replacement. This could contribute to memory difficulties, too soon to tell if memory issues will benefit with replacement.       Relevant Orders   Folate   Vitamin B12   Vitamin B1   Skin lesion    Firm skin nodule present on right chest wall for several months - will refer to derm.       Relevant Orders   Ambulatory referral to Dermatology       Meds ordered this encounter  Medications  . vitamin B-12 (CYANOCOBALAMIN) 1000 MCG tablet    Sig: Take 1 tablet (1,000 mcg total) by mouth daily.  Marland Kitchen amLODipine (NORVASC) 5 MG tablet    Sig: Take 1 tablet (5 mg total) by mouth daily.    Dispense:  90 tablet    Refill:  3  . cyanocobalamin ((VITAMIN B-12)) injection 1,000 mcg   Orders Placed This Encounter  Procedures  . Folate    Standing Status:   Future    Standing Expiration Date:   02/14/2022  . Vitamin B12    Standing Status:   Future    Standing Expiration Date:   02/14/2022  . Vitamin B1    Standing Status:   Future    Standing Expiration Date:   02/15/2022  . Ambulatory referral to Dermatology    Referral Priority:   Routine    Referral Type:   Consultation    Referral Reason:   Specialty Services Required    Requested Specialty:   Dermatology    Number of Visits Requested:   1    Patient instructions: Vitamin B12 shot today then continue daily oral B12 replacement at home.  Blood pressures are staying elevated - start monitoring at home - your  goal is <140/90. Start amlodipine 5mg  daily new blood pressure medicine.  Call to schedule mammogram at your convenience: [place] [phone].  Goal vitamin D daily is 1000 units. Goal calcium intake is 1200mg  daily.  Check on date of booster and let me know.  If interested, check with pharmacy about new 2 shot shingles series (shingrix).  We will refer you to skin doctor in [place].  Bring [place] copy of your advanced directives to update your chart.  Return as needed or in 3-4 months for blood pressure follow up visit.   Follow up plan: Return in about 6 months (around 08/17/2021) for follow up visit.  Ria Bush, MD

## 2021-02-14 NOTE — Assessment & Plan Note (Addendum)
Not on aspirin or statin.

## 2021-02-14 NOTE — Assessment & Plan Note (Addendum)
Recently found low B12 - will replete and reassess.  Head CT showed mild-mod cerebral atrophy with chronic ischemic changes.  Recommend rpt MMSE in 3-4 months after several months of B12 replacement. If no improvement, consider further treatment. If progression, low threshold to refer to neurology.  Previously reviewed lifestyle choices for brain health.

## 2021-02-14 NOTE — Assessment & Plan Note (Signed)
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

## 2021-02-15 NOTE — Addendum Note (Signed)
Addended by: Ria Bush on: 02/15/2021 11:17 AM   Modules accepted: Orders

## 2021-02-19 MED ORDER — ROCURONIUM BROMIDE 10 MG/ML (PF) SYRINGE
PREFILLED_SYRINGE | INTRAVENOUS | Status: AC
  Filled 2021-02-19: qty 60

## 2021-02-19 MED ORDER — LIDOCAINE HCL (PF) 2 % IJ SOLN
INTRAMUSCULAR | Status: AC
  Filled 2021-02-19: qty 20

## 2021-02-19 NOTE — Discharge Instructions (Signed)

## 2021-02-21 ENCOUNTER — Ambulatory Visit: Payer: Medicare HMO | Admitting: Anesthesiology

## 2021-02-21 ENCOUNTER — Encounter
Admission: RE | Disposition: A | Discharge status: Home / Self Care | Payer: Self-pay | Source: Home / Self Care | Attending: Ophthalmology

## 2021-02-21 ENCOUNTER — Other Ambulatory Visit: Payer: Self-pay

## 2021-02-21 ENCOUNTER — Ambulatory Visit
Admission: RE | Admit: 2021-02-21 | Discharge: 2021-02-21 | Disposition: A | Discharge status: Home / Self Care | Payer: Medicare HMO | Attending: Ophthalmology | Admitting: Ophthalmology

## 2021-02-21 ENCOUNTER — Encounter: Payer: Self-pay | Admitting: Ophthalmology

## 2021-02-21 DIAGNOSIS — Z79899 Other long term (current) drug therapy: Secondary | ICD-10-CM | POA: Insufficient documentation

## 2021-02-21 DIAGNOSIS — Z8249 Family history of ischemic heart disease and other diseases of the circulatory system: Secondary | ICD-10-CM | POA: Diagnosis not present

## 2021-02-21 DIAGNOSIS — J439 Emphysema, unspecified: Secondary | ICD-10-CM | POA: Diagnosis not present

## 2021-02-21 DIAGNOSIS — H278 Other specified disorders of lens: Secondary | ICD-10-CM | POA: Diagnosis not present

## 2021-02-21 DIAGNOSIS — H25811 Combined forms of age-related cataract, right eye: Secondary | ICD-10-CM | POA: Diagnosis not present

## 2021-02-21 DIAGNOSIS — Z823 Family history of stroke: Secondary | ICD-10-CM | POA: Insufficient documentation

## 2021-02-21 DIAGNOSIS — Z7951 Long term (current) use of inhaled steroids: Secondary | ICD-10-CM | POA: Insufficient documentation

## 2021-02-21 DIAGNOSIS — F17290 Nicotine dependence, other tobacco product, uncomplicated: Secondary | ICD-10-CM | POA: Diagnosis not present

## 2021-02-21 DIAGNOSIS — Z8262 Family history of osteoporosis: Secondary | ICD-10-CM | POA: Diagnosis not present

## 2021-02-21 DIAGNOSIS — Z833 Family history of diabetes mellitus: Secondary | ICD-10-CM | POA: Insufficient documentation

## 2021-02-21 DIAGNOSIS — Z801 Family history of malignant neoplasm of trachea, bronchus and lung: Secondary | ICD-10-CM | POA: Insufficient documentation

## 2021-02-21 DIAGNOSIS — H2511 Age-related nuclear cataract, right eye: Secondary | ICD-10-CM | POA: Insufficient documentation

## 2021-02-21 DIAGNOSIS — R69 Illness, unspecified: Secondary | ICD-10-CM | POA: Diagnosis not present

## 2021-02-21 DIAGNOSIS — Z880 Allergy status to penicillin: Secondary | ICD-10-CM | POA: Insufficient documentation

## 2021-02-21 HISTORY — PX: CATARACT EXTRACTION W/PHACO: SHX586

## 2021-02-21 SURGERY — PHACOEMULSIFICATION, CATARACT, WITH IOL INSERTION
Anesthesia: Monitor Anesthesia Care | Laterality: Right

## 2021-02-21 MED ORDER — MIDAZOLAM HCL 2 MG/2ML IJ SOLN
INTRAMUSCULAR | Status: DC | PRN
  Administered 2021-02-21: 1.5 mg via INTRAVENOUS

## 2021-02-21 MED ORDER — OXYCODONE HCL 5 MG/5ML PO SOLN: 5.0000 mg | Freq: Once | ORAL | Status: DC | PRN

## 2021-02-21 MED ORDER — MOXIFLOXACIN HCL 0.5 % OP SOLN
OPHTHALMIC | Status: DC | PRN
  Administered 2021-02-21: 0.2 mL via OPHTHALMIC

## 2021-02-21 MED ORDER — TETRACAINE HCL 0.5 % OP SOLN
1.0000 [drp] | OPHTHALMIC | Status: DC | PRN
  Administered 2021-02-21 (×3): 1 [drp] via OPHTHALMIC

## 2021-02-21 MED ORDER — NA HYALUR & NA CHOND-NA HYALUR 0.4-0.35 ML IO KIT
PACK | INTRAOCULAR | Status: DC | PRN
  Administered 2021-02-21: 1 mL via INTRAOCULAR

## 2021-02-21 MED ORDER — BRIMONIDINE TARTRATE-TIMOLOL 0.2-0.5 % OP SOLN
OPHTHALMIC | Status: DC | PRN
  Administered 2021-02-21: 1 [drp] via OPHTHALMIC

## 2021-02-21 MED ORDER — FENTANYL CITRATE (PF) 100 MCG/2ML IJ SOLN
INTRAMUSCULAR | Status: DC | PRN
  Administered 2021-02-21: 50 ug via INTRAVENOUS

## 2021-02-21 MED ORDER — LACTATED RINGERS IV SOLN: INTRAVENOUS | Status: DC

## 2021-02-21 MED ORDER — LIDOCAINE HCL (PF) 2 % IJ SOLN
INTRAOCULAR | Status: DC | PRN
  Administered 2021-02-21: 1 mL

## 2021-02-21 MED ORDER — ARMC OPHTHALMIC DILATING DROPS
1.0000 "application " | OPHTHALMIC | Status: DC | PRN
  Administered 2021-02-21 (×3): 1 via OPHTHALMIC

## 2021-02-21 MED ORDER — OXYCODONE HCL 5 MG PO TABS: 5.0000 mg | ORAL_TABLET | Freq: Once | ORAL | Status: DC | PRN

## 2021-02-21 MED ORDER — EPINEPHRINE PF 1 MG/ML IJ SOLN
INTRAOCULAR | Status: DC | PRN
  Administered 2021-02-21: 44 mL via OPHTHALMIC

## 2021-02-21 SURGICAL SUPPLY — 18 items
CANNULA ANT/CHMB 27GA (MISCELLANEOUS) ×2 IMPLANT
GLOVE SURG ENC TEXT LTX SZ7.5 (GLOVE) ×2 IMPLANT
GLOVE SURG TRIUMPH 8.0 PF LTX (GLOVE) ×2 IMPLANT
GOWN STRL REUS W/ TWL LRG LVL3 (GOWN DISPOSABLE) ×2 IMPLANT
GOWN STRL REUS W/TWL LRG LVL3 (GOWN DISPOSABLE) ×4
LENS IOL TECNIS EYHANCE 22.0 (Intraocular Lens) ×2 IMPLANT
MARKER SKIN DUAL TIP RULER LAB (MISCELLANEOUS) ×2 IMPLANT
NEEDLE CAPSULORHEX 25GA (NEEDLE) ×2 IMPLANT
NEEDLE FILTER BLUNT 18X 1/2SAF (NEEDLE) ×2
NEEDLE FILTER BLUNT 18X1 1/2 (NEEDLE) ×2 IMPLANT
PACK CATARACT BRASINGTON (MISCELLANEOUS) ×2 IMPLANT
PACK EYE AFTER SURG (MISCELLANEOUS) ×2 IMPLANT
PACK OPTHALMIC (MISCELLANEOUS) ×2 IMPLANT
SOLUTION OPHTHALMIC SALT (MISCELLANEOUS) ×2 IMPLANT
SYR 3ML LL SCALE MARK (SYRINGE) ×4 IMPLANT
SYR TB 1ML LUER SLIP (SYRINGE) ×2 IMPLANT
WATER STERILE IRR 250ML POUR (IV SOLUTION) ×2 IMPLANT
WIPE NON LINTING 3.25X3.25 (MISCELLANEOUS) ×2 IMPLANT

## 2021-02-21 NOTE — Anesthesia Postprocedure Evaluation (Signed)
Anesthesia Post Note  Patient: Lindsay Mason  Procedure(s) Performed: CATARACT EXTRACTION PHACO AND INTRAOCULAR LENS PLACEMENT (IOC) RIGHT (Right )     Patient location during evaluation: PACU Anesthesia Type: MAC Level of consciousness: awake and alert Pain management: pain level controlled Vital Signs Assessment: post-procedure vital signs reviewed and stable Respiratory status: spontaneous breathing, nonlabored ventilation, respiratory function stable and patient connected to nasal cannula oxygen Cardiovascular status: stable and blood pressure returned to baseline Postop Assessment: no apparent nausea or vomiting Anesthetic complications: no   No complications documented.  Fidel Levy

## 2021-02-21 NOTE — Anesthesia Procedure Notes (Signed)
Procedure Name: MAC Performed by: Tadeo Besecker, CRNA Pre-anesthesia Checklist: Patient identified, Emergency Drugs available, Suction available, Timeout performed and Patient being monitored Patient Re-evaluated:Patient Re-evaluated prior to induction Oxygen Delivery Method: Nasal cannula Placement Confirmation: positive ETCO2       

## 2021-02-21 NOTE — Transfer of Care (Signed)
Immediate Anesthesia Transfer of Care Note  Patient: Lindsay Mason  Procedure(s) Performed: CATARACT EXTRACTION PHACO AND INTRAOCULAR LENS PLACEMENT (IOC) RIGHT (Right )  Patient Location: PACU  Anesthesia Type: MAC  Level of Consciousness: awake, alert  and patient cooperative  Airway and Oxygen Therapy: Patient Spontanous Breathing and Patient connected to supplemental oxygen  Post-op Assessment: Post-op Vital signs reviewed, Patient's Cardiovascular Status Stable, Respiratory Function Stable, Patent Airway and No signs of Nausea or vomiting  Post-op Vital Signs: Reviewed and stable  Complications: No complications documented.

## 2021-02-21 NOTE — Anesthesia Preprocedure Evaluation (Signed)
Anesthesia Evaluation  Patient identified by MRN, date of birth, ID band Patient awake    Reviewed: NPO status   History of Anesthesia Complications Negative for: history of anesthetic complications  Airway Mallampati: II  TM Distance: >3 FB Neck ROM: full    Dental no notable dental hx.    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      Pulmonary exam normal        Cardiovascular Exercise Tolerance: Good hypertension, Normal cardiovascular exam  Thoracic aortic atherosclerosis  ?   Neuro/Psych negative neurological ROS  negative psych ROS   GI/Hepatic negative GI ROS, Neg liver ROS,   Endo/Other  negative endocrine ROS  Renal/GU negative Renal ROS  negative genitourinary   Musculoskeletal   Abdominal   Peds  Hematology Lung ca in 2021   Anesthesia Other Findings   pcp:  Ria Bush, MD at 02/14/2021  ;    Reproductive/Obstetrics                             Anesthesia Physical Anesthesia Plan  ASA: III  Anesthesia Plan: MAC   Post-op Pain Management:    Induction:   PONV Risk Score and Plan: 2 and Midazolam and TIVA  Airway Management Planned:   Additional Equipment:   Intra-op Plan:   Post-operative Plan:   Informed Consent: I have reviewed the patients History and Physical, chart, labs and discussed the procedure including the risks, benefits and alternatives for the proposed anesthesia with the patient or authorized representative who has indicated his/her understanding and acceptance.       Plan Discussed with: CRNA  Anesthesia Plan Comments:         Anesthesia Quick Evaluation

## 2021-02-21 NOTE — H&P (Signed)
[facility]   Primary Care Physician:  Ria Bush, MD Ophthalmologist: Dr. Leandrew Koyanagi  Pre-Procedure History & Physical: HPI:  Lindsay [facility] is a 70 y.o. female here for ophthalmic surgery.   Past Medical History:  Diagnosis Date  . Adrenal hyperplasia ([place]) 12/2011   on CT  . Allergy   . CAP (community acquired pneumonia) 12/2011  . Chest pain    normal ETT 10/2011  . 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 Ex-smoker 08/2014   currently using e cig  . Microscopic colitis 06/2015   lymphocytic by colonoscopy - started entocort [name]  . Osteoporosis, unspecified   . Scoliosis of lumbar spine   . Thoracic aortic atherosclerosis ([place]) 02/2016   by CT    Past Surgical History:  Procedure Laterality Date  . COLONOSCOPY  06/2015   microscopic colitis [name]  . DEXA  10/2008   Osteoporosis  . DEXA  02/2014   T score osteopenia hip, osteoporosis spine, scoliosis  . ELECTROMAGNETIC NAVIGATION BROCHOSCOPY  01/20/2020   Procedure: ELECTROMAGNETIC NAVIGATION BRONCHOSCOPY;  Surgeon: [name], DO;  Location: [place] [facility];  Service: Pulmonary;;  . ETT  10/2011   WNL, no evidence of ischemia, excellent exercise tolerance  . EXPLORATORY LAPAROTOMY    . INTERCOSTAL NERVE BLOCK Right 01/20/2020   Procedure: Intercostal Nerve Block;  Surgeon: [name], MD;  Location: [place];  Service: Thoracic;  Laterality: Right;  . MRI head  07/2011   multiple foci deep and subcortical white matter, chronic ischemic vs demyelinating, no active disease  . NODE DISSECTION  01/20/2020   Procedure: Node Dissection;  Surgeon: [name], MD;  Location: [place];  Service: Thoracic;;  . SUBMUCOSAL TATTOO INJECTION  01/20/2020   Procedure: SUBSTITIAL TATTOO INJECTION;  Surgeon: [name], DO;  Location: [facility];  Service: Pulmonary;;  . TONSILLECTOMY AND ADENOIDECTOMY    . Vein removal     removed from legs   . VIDEO ASSISTED THORACOSCOPY (VATS)/WEDGE RESECTION Right 01/20/2020   XI ROBOTIC ASSISTED THORASCOPY-RIGHT LOWER LOBE WEDGE RESECTION, RIGHT LOWER LOBECTOMYRightGeneral  . VIDEO BRONCHOSCOPY  01/20/2020   Procedure: VIDEO BRONCHOSCOPY WITH FLUORO;  Surgeon: [name], DO;  Location: [place];  Service: Pulmonary;;    Prior to Admission medications   Medication Sig Start Date End Date Taking? Authorizing Provider  albuterol (VENTOLIN HFA) 108 (90 Base) MCG/ACT inhaler INHALE TWO PUFFS BY MOUTH INTO LUNGS EVERY 6 HOURS AS NEEDED FOR SHORTNESS OF BREATH OR wheezing 01/04/21  Yes Ria Bush, MD  amLODipine (NORVASC) 5 MG tablet Take 1 tablet (5 mg total) by mouth daily. 02/14/21  Yes Ria Bush, MD  Multiple Vitamin (MULTIVITAMIN) tablet Take 1 tablet by mouth daily.   Yes [provider]  vitamin B-12 (CYANOCOBALAMIN) 1000 MCG tablet Take 1 tablet (1,000 mcg total) by mouth daily. 02/14/21  Yes Ria Bush, MD    Allergies as of 01/02/2021 - Review Complete 12/29/2020  Allergen Reaction Noted  . Penicillins Swelling     Family History  Problem Relation Age of Onset  . Osteoporosis Other        great grandmother  . Stroke Maternal Grandmother   . Coronary artery disease Maternal Grandfather 50  . Diabetes Maternal Grandfather   . Coronary artery disease Paternal Grandmother 80  . Coronary artery disease Paternal Grandfather 48  . Lung cancer Maternal Aunt     Social History   Socioeconomic  History  . Marital status: Married    Spouse name: Not on file  . Number of children: 3  . Years of education: Not on file  . Highest education level: Not on file  Occupational History  . Occupation: retired  Tobacco Use  . Smoking status: Former Smoker    Packs/day: 1.00    Years: 30.00    Pack years: 30.00    Types: Cigarettes    Start date: 10/14/1978    Quit date: 07/15/2015    Years since quitting: 5.6  . Smokeless tobacco: Never Used  . Tobacco  comment: off and on smoker, now e-cigarettes  Vaping Use  . Vaping Use: Some days  . Devices: uses occasionally - no nicotine  Substance and Sexual Activity  . Alcohol use: Yes    Alcohol/week: 0.0 standard drinks    Comment: social wine once a week  . Drug use: No  . Sexual activity: Not on file  Other Topics Concern  . Not on file  Social History Narrative   Caffeine: 1 cup coffee/day   Lives with husband, outside dog   Smoking vapes.   Pt tans regularly - at her pool and in her garden. Wears sunscreen   Social Determinants of Health   Financial Resource Strain: Low Risk   . Difficulty of Paying Living Expenses: Not hard at all  Food Insecurity: No Food Insecurity  . Worried About Charity fundraiser in the Last Year: Never true  . Ran Out of Food in the Last Year: Never true  Transportation Needs: No Transportation Needs  . Lack of Transportation (Medical): No  . Lack of Transportation (Non-Medical): No  Physical Activity: Sufficiently Active  . Days of Exercise per Week: 7 days  . Minutes of Exercise per Session: 60 min  Stress: No Stress Concern Present  . Feeling of Stress : Not at all  Social Connections: Not on file  Intimate Partner Violence: Not At Risk  . Fear of Current or Ex-Partner: No  . Emotionally Abused: No  . Physically Abused: No  . Sexually Abused: No    Review of Systems: See HPI, otherwise negative ROS  Physical Exam: BP 139/87   Pulse 84   Temp (!) 97.5 F (36.4 C) (Temporal)   Ht 5' 0.5" (1.537 m)   Wt 63.5 kg   SpO2 100%   BMI 26.89 kg/m  General:   Alert,  pleasant and cooperative in NAD Head:  Normocephalic and atraumatic. Lungs:  Clear to auscultation.    Heart:  Regular rate and rhythm.   Impression/Plan: Lindsay [facility] is here for ophthalmic surgery.  Risks, benefits, limitations, and alternatives regarding ophthalmic surgery have been reviewed with the patient.  Questions have been answered.  All parties  agreeable.   Leandrew Koyanagi, MD  02/21/2021, 11:05 AM

## 2021-02-21 NOTE — Op Note (Signed)
  LOCATION:  [place]   PREOPERATIVE DIAGNOSIS:    Nuclear sclerotic cataract right eye. H25.11   POSTOPERATIVE DIAGNOSIS:  Nuclear sclerotic cataract right eye with loose zonules   PROCEDURE:  Phacoemusification with posterior chamber intraocular lens placement of the right eye   ULTRASOUND TIME: Procedure(s) with comments: CATARACT EXTRACTION PHACO AND INTRAOCULAR LENS PLACEMENT (IOC) RIGHT (Right) - cde 4.75 00:41.1 minutes 11.5%  LENS:   Implant Name Type Inv. Item Serial No. Manufacturer Lot No. LRB No. Used Action  LENS IOL TECNIS EYHANCE 22.0 - [id number] Intraocular Lens LENS IOL TECNIS EYHANCE 22.0 [id number] JOHNSON   Right 1 Implanted         SURGEON:  Wyonia Hough, MD   ANESTHESIA:  Topical with tetracaine drops and 2% Xylocaine jelly, augmented with 1% preservative-free intracameral lidocaine.    COMPLICATIONS:  None.   DESCRIPTION OF PROCEDURE:  The patient was identified in the holding room and transported to the operating room and placed in the supine position under the operating microscope.  The right eye was identified as the operative eye and it was prepped and draped in the usual sterile ophthalmic fashion.   A 1 millimeter clear-corneal paracentesis was made at the 12:00 position.  0.5 ml of preservative-free 1% lidocaine was injected into the anterior chamber. The anterior chamber was filled with Viscoat viscoelastic.  A 2.4 millimeter keratome was used to make a near-clear corneal incision at the 9:00 position.  A curvilinear capsulorrhexis was made with a cystotome and capsulorrhexis forceps.  Balanced salt solution was used to hydrodissect and hydrodelineate the nucleus.   Phacoemulsification was then used in stop and chop fashion to remove the lens nucleus and epinucleus.  The remaining cortex was then removed using the irrigation and aspiration handpiece. Loose zonules were noted during cortex removal with excess mobility of the  posterior capsule and equatorial capsule.   Provisc was then placed into the capsular bag to distend it for lens placement.  A lens was then injected into the capsular bag.  The remaining viscoelastic was aspirated.  the IOL remained centered. Wounds were hydrated with balanced salt solution.  The anterior chamber was inflated to a physiologic pressure with balanced salt solution.  No wound leaks were noted. Vigamox 0.2 ml of a 1mg  per ml solution was injected into the anterior chamber for a dose of 0.2 mg of intracameral antibiotic at the completion of the case.   Timolol and Brimonidine drops were applied to the eye.  The patient was taken to the recovery room in stable condition without complications of anesthesia or surgery.   Lindsy Cerullo 02/21/2021, 12:21 PM

## 2021-02-22 ENCOUNTER — Encounter: Payer: Self-pay | Admitting: Ophthalmology

## 2021-02-27 DIAGNOSIS — H2512 Age-related nuclear cataract, left eye: Secondary | ICD-10-CM | POA: Diagnosis not present

## 2021-02-28 ENCOUNTER — Encounter: Payer: Self-pay | Admitting: Ophthalmology

## 2021-03-07 DIAGNOSIS — L720 Epidermal cyst: Secondary | ICD-10-CM | POA: Diagnosis not present

## 2021-03-09 ENCOUNTER — Other Ambulatory Visit: Payer: Self-pay | Admitting: Family Medicine

## 2021-03-09 NOTE — Discharge Instructions (Signed)

## 2021-03-14 ENCOUNTER — Ambulatory Visit: Payer: Medicare HMO | Admitting: Anesthesiology

## 2021-03-14 ENCOUNTER — Encounter
Admission: RE | Disposition: A | Discharge status: Home / Self Care | Payer: Self-pay | Source: Home / Self Care | Attending: Ophthalmology

## 2021-03-14 ENCOUNTER — Other Ambulatory Visit: Payer: Self-pay

## 2021-03-14 ENCOUNTER — Ambulatory Visit
Admission: RE | Admit: 2021-03-14 | Discharge: 2021-03-14 | Disposition: A | Discharge status: Home / Self Care | Payer: Medicare HMO | Attending: Ophthalmology | Admitting: Ophthalmology

## 2021-03-14 ENCOUNTER — Encounter: Payer: Self-pay | Admitting: Ophthalmology

## 2021-03-14 DIAGNOSIS — Z8262 Family history of osteoporosis: Secondary | ICD-10-CM | POA: Insufficient documentation

## 2021-03-14 DIAGNOSIS — Z79899 Other long term (current) drug therapy: Secondary | ICD-10-CM | POA: Insufficient documentation

## 2021-03-14 DIAGNOSIS — Z880 Allergy status to penicillin: Secondary | ICD-10-CM | POA: Diagnosis not present

## 2021-03-14 DIAGNOSIS — H2512 Age-related nuclear cataract, left eye: Secondary | ICD-10-CM | POA: Diagnosis not present

## 2021-03-14 DIAGNOSIS — J439 Emphysema, unspecified: Secondary | ICD-10-CM | POA: Insufficient documentation

## 2021-03-14 DIAGNOSIS — Z8719 Personal history of other diseases of the digestive system: Secondary | ICD-10-CM | POA: Diagnosis not present

## 2021-03-14 DIAGNOSIS — H25812 Combined forms of age-related cataract, left eye: Secondary | ICD-10-CM | POA: Diagnosis not present

## 2021-03-14 DIAGNOSIS — Z87891 Personal history of nicotine dependence: Secondary | ICD-10-CM | POA: Diagnosis not present

## 2021-03-14 HISTORY — PX: CATARACT EXTRACTION W/PHACO: SHX586

## 2021-03-14 SURGERY — PHACOEMULSIFICATION, CATARACT, WITH IOL INSERTION
Anesthesia: Monitor Anesthesia Care | Site: Eye | Laterality: Left

## 2021-03-14 MED ORDER — LIDOCAINE HCL (PF) 2 % IJ SOLN
INTRAOCULAR | Status: DC | PRN
  Administered 2021-03-14: 1 mL

## 2021-03-14 MED ORDER — MIDAZOLAM HCL 2 MG/2ML IJ SOLN
INTRAMUSCULAR | Status: DC | PRN
  Administered 2021-03-14: .5 mg via INTRAVENOUS
  Administered 2021-03-14: 1 mg via INTRAVENOUS

## 2021-03-14 MED ORDER — MOXIFLOXACIN HCL 0.5 % OP SOLN
OPHTHALMIC | Status: DC | PRN
  Administered 2021-03-14: 0.2 mL via OPHTHALMIC

## 2021-03-14 MED ORDER — EPINEPHRINE PF 1 MG/ML IJ SOLN
INTRAOCULAR | Status: DC | PRN
  Administered 2021-03-14: 57 mL via OPHTHALMIC

## 2021-03-14 MED ORDER — ARMC OPHTHALMIC DILATING DROPS
1.0000 "application " | OPHTHALMIC | Status: DC | PRN
  Administered 2021-03-14 (×3): 1 via OPHTHALMIC

## 2021-03-14 MED ORDER — NA HYALUR & NA CHOND-NA HYALUR 0.4-0.35 ML IO KIT
PACK | INTRAOCULAR | Status: DC | PRN
  Administered 2021-03-14: 1 mL via INTRAOCULAR

## 2021-03-14 MED ORDER — TETRACAINE HCL 0.5 % OP SOLN
1.0000 [drp] | OPHTHALMIC | Status: DC | PRN
  Administered 2021-03-14 (×3): 1 [drp] via OPHTHALMIC

## 2021-03-14 MED ORDER — FENTANYL CITRATE (PF) 100 MCG/2ML IJ SOLN
INTRAMUSCULAR | Status: DC | PRN
  Administered 2021-03-14: 50 ug via INTRAVENOUS

## 2021-03-14 MED ORDER — LACTATED RINGERS IV SOLN: INTRAVENOUS | Status: DC

## 2021-03-14 MED ORDER — BRIMONIDINE TARTRATE-TIMOLOL 0.2-0.5 % OP SOLN
OPHTHALMIC | Status: DC | PRN
  Administered 2021-03-14: 1 [drp] via OPHTHALMIC

## 2021-03-14 SURGICAL SUPPLY — 18 items
CANNULA ANT/CHMB 27GA (MISCELLANEOUS) ×2 IMPLANT
GLOVE SURG ENC TEXT LTX SZ7.5 (GLOVE) ×2 IMPLANT
GLOVE SURG TRIUMPH 8.0 PF LTX (GLOVE) ×2 IMPLANT
GOWN STRL REUS W/ TWL LRG LVL3 (GOWN DISPOSABLE) ×2 IMPLANT
GOWN STRL REUS W/TWL LRG LVL3 (GOWN DISPOSABLE) ×4
LENS IOL TECNIS EYHANCE 21.0 (Intraocular Lens) ×2 IMPLANT
MARKER SKIN DUAL TIP RULER LAB (MISCELLANEOUS) ×2 IMPLANT
NEEDLE CAPSULORHEX 25GA (NEEDLE) ×2 IMPLANT
NEEDLE FILTER BLUNT 18X 1/2SAF (NEEDLE) ×2
NEEDLE FILTER BLUNT 18X1 1/2 (NEEDLE) ×2 IMPLANT
PACK CATARACT BRASINGTON (MISCELLANEOUS) ×2 IMPLANT
PACK EYE AFTER SURG (MISCELLANEOUS) ×2 IMPLANT
PACK OPTHALMIC (MISCELLANEOUS) ×2 IMPLANT
SOLUTION OPHTHALMIC SALT (MISCELLANEOUS) ×2 IMPLANT
SYR 3ML LL SCALE MARK (SYRINGE) ×4 IMPLANT
SYR TB 1ML LUER SLIP (SYRINGE) ×2 IMPLANT
WATER STERILE IRR 250ML POUR (IV SOLUTION) ×2 IMPLANT
WIPE NON LINTING 3.25X3.25 (MISCELLANEOUS) ×2 IMPLANT

## 2021-03-14 NOTE — H&P (Signed)
[facility]   Primary Care Physician:  Lindsay Bush, MD Ophthalmologist: Dr. Leandrew [facility]  Pre-Procedure History & Physical: HPI:  Lindsay [facility] is a 70 y.o. female here for ophthalmic surgery.   Past Medical History:  Diagnosis Date  . Adrenal hyperplasia ([place]) 12/2011   on CT  . Allergy   . CAP (community acquired pneumonia) 12/2011  . Chest pain    normal ETT 10/2011  . 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 Ex-smoker 08/2014   currently using e cig  . Microscopic colitis 06/2015   lymphocytic by colonoscopy - started entocort Lindsay Mason)  . Osteoporosis, unspecified   . Scoliosis of lumbar spine   . Thoracic aortic atherosclerosis ([place]) 02/2016   by CT    Past Surgical History:  Procedure Laterality Date  . CATARACT EXTRACTION W/PHACO Right 02/21/2021   Procedure: CATARACT EXTRACTION PHACO AND INTRAOCULAR LENS PLACEMENT ([place]) RIGHT;  Surgeon: Lindsay Koyanagi, MD;  Location: [place];  Service: Ophthalmology;  Laterality: Right;  cde 4.75 00:41.1 minutes 11.5%  . COLONOSCOPY  06/2015   microscopic colitis Lindsay Mason)  . DEXA  10/2008   Osteoporosis  . DEXA  02/2014   T score osteopenia hip, osteoporosis spine, scoliosis  . ELECTROMAGNETIC NAVIGATION BROCHOSCOPY  01/20/2020   Procedure: ELECTROMAGNETIC NAVIGATION BRONCHOSCOPY;  Surgeon: Lindsay Nash, DO;  Location: [facility];  Service: Pulmonary;;  . ETT  10/2011   WNL, no evidence of ischemia, excellent exercise tolerance  . EXPLORATORY LAPAROTOMY    . INTERCOSTAL NERVE BLOCK Right 01/20/2020   Procedure: Intercostal Nerve Block;  Surgeon: Lindsay Matte, MD;  Location: [place];  Service: Thoracic;  Laterality: Right;  . MRI head  07/2011   multiple foci deep and subcortical white matter, chronic ischemic vs demyelinating, no active disease  . NODE DISSECTION  01/20/2020   Procedure: Node Dissection;  Surgeon: Lindsay Matte, MD;   Location: [place];  Service: Thoracic;;  . SUBMUCOSAL TATTOO INJECTION  01/20/2020   Procedure: SUBSTITIAL TATTOO INJECTION;  Surgeon: Lindsay Nash, DO;  Location: [facility];  Service: Pulmonary;;  . TONSILLECTOMY AND ADENOIDECTOMY    . Vein removal     removed from legs  . VIDEO ASSISTED THORACOSCOPY (VATS)/WEDGE RESECTION Right 01/20/2020   XI ROBOTIC ASSISTED THORASCOPY-RIGHT LOWER LOBE WEDGE RESECTION, RIGHT LOWER LOBECTOMYRightGeneral  . VIDEO BRONCHOSCOPY  01/20/2020   Procedure: VIDEO BRONCHOSCOPY WITH FLUORO;  Surgeon: Lindsay Nash, DO;  Location: [place];  Service: Pulmonary;;    Prior to Admission medications   Medication Sig Start Date End Date Taking? Authorizing Provider  albuterol (VENTOLIN HFA) 108 (90 Base) MCG/ACT inhaler INHALE TWO PUFFS BY MOUTH INTO LUNGS EVERY SIX HOURS AS NEEDED FOR SHORTNESS OF BREATH OR wheezing 03/09/21  Yes Lindsay Bush, MD  amLODipine (NORVASC) 5 MG tablet Take 1 tablet (5 mg total) by mouth daily. 02/14/21  Yes Lindsay Bush, MD  Multiple Vitamin (MULTIVITAMIN) tablet Take 1 tablet by mouth daily.   Yes [provider]  vitamin B-12 (CYANOCOBALAMIN) 1000 MCG tablet Take 1 tablet (1,000 mcg total) by mouth daily. 02/14/21  Yes Lindsay Bush, MD    Allergies as of 01/02/2021 - Review Complete 12/29/2020  Allergen Reaction Noted  . Penicillins Swelling     Family History  Problem Relation Age of Onset  . Osteoporosis Other        great grandmother  . Stroke Maternal Grandmother   . Coronary artery  disease Maternal Grandfather 33  . Diabetes Maternal Grandfather   . Coronary artery disease Paternal Grandmother 80  . Coronary artery disease Paternal Grandfather 39  . Lung cancer Maternal Aunt     Social History   Socioeconomic History  . Marital status: Married    Spouse name: Not on file  . Number of children: 3  . Years of education: Not on file  . Highest education level: Not on file  Occupational  History  . Occupation: retired  Tobacco Use  . Smoking status: Former Smoker    Packs/day: 1.00    Years: 30.00    Pack years: 30.00    Types: Cigarettes    Start date: 10/14/1978    Quit date: 07/15/2015    Years since quitting: 5.6  . Smokeless tobacco: Never Used  . Tobacco comment: off and on smoker, now e-cigarettes  Vaping Use  . Vaping Use: Some days  . Devices: uses occasionally - no nicotine  Substance and Sexual Activity  . Alcohol use: Yes    Alcohol/week: 0.0 standard drinks    Comment: social wine once a week  . Drug use: No  . Sexual activity: Not on file  Other Topics Concern  . Not on file  Social History Narrative   Caffeine: 1 cup coffee/day   Lives with husband, outside dog   Smoking vapes.   Pt tans regularly - at her pool and in her garden. Wears sunscreen   Social Determinants of Health   Financial Resource Strain: Low Risk   . Difficulty of Paying Living Expenses: Not hard at all  Food Insecurity: No Food Insecurity  . Worried About Charity fundraiser in the Last Year: Never true  . Ran Out of Food in the Last Year: Never true  Transportation Needs: No Transportation Needs  . Lack of Transportation (Medical): No  . Lack of Transportation (Non-Medical): No  Physical Activity: Sufficiently Active  . Days of Exercise per Week: 7 days  . Minutes of Exercise per Session: 60 min  Stress: No Stress Concern Present  . Feeling of Stress : Not at all  Social Connections: Not on file  Intimate Partner Violence: Not At Risk  . Fear of Current or Ex-Partner: No  . Emotionally Abused: No  . Physically Abused: No  . Sexually Abused: No    Review of Systems: See HPI, otherwise negative ROS  Physical Exam: BP (!) 159/95   Pulse 76   Temp (!) 97.1 F (36.2 C) (Temporal)   Resp (!) 22   Ht 5\' 5"  (1.651 m)   Wt 63.5 kg   SpO2 100%   BMI 23.30 kg/m  General:   Alert,  pleasant and cooperative in NAD Head:  Normocephalic and atraumatic. Lungs:   Clear to auscultation.    Heart:  Regular rate and rhythm.   Impression/Plan: Lindsay Mason is here for ophthalmic surgery.  Risks, benefits, limitations, and alternatives regarding ophthalmic surgery have been reviewed with the patient.  Questions have been answered.  All parties agreeable.   Lindsay Koyanagi, MD  03/14/2021, 11:51 AM

## 2021-03-14 NOTE — Anesthesia Preprocedure Evaluation (Signed)
Anesthesia Evaluation  Patient identified by MRN, date of birth, ID band Patient awake    Reviewed: NPO status   History of Anesthesia Complications Negative for: history of anesthetic complications  Airway Mallampati: II  TM Distance: >3 FB Neck ROM: full    Dental no notable dental hx.    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      breath sounds clear to auscultation + decreased breath sounds (S/P lobectomy)      Cardiovascular Exercise Tolerance: Good hypertension, Normal cardiovascular exam Rhythm:Regular Rate:Normal  Thoracic aortic atherosclerosis  ?   Neuro/Psych negative neurological ROS  negative psych ROS   GI/Hepatic negative GI ROS, Neg liver ROS,   Endo/Other  negative endocrine ROS  Renal/GU negative Renal ROS  negative genitourinary   Musculoskeletal   Abdominal Normal abdominal exam  (+) - obese,  Abdomen: soft.    Peds  Hematology Lung ca in 2021   Anesthesia Other Findings   pcp:  Ria Bush, MD at 02/14/2021  ;    Reproductive/Obstetrics                             Anesthesia Physical  Anesthesia Plan  ASA: III  Anesthesia Plan: MAC   Post-op Pain Management:    Induction: Intravenous  PONV Risk Score and Plan: 2 and Midazolam, Treatment may vary due to age or medical condition and TIVA  Airway Management Planned: Natural Airway and Nasal Cannula  Additional Equipment:   Intra-op Plan:   Post-operative Plan:   Informed Consent: I have reviewed the patients History and Physical, chart, labs and discussed the procedure including the risks, benefits and alternatives for the proposed anesthesia with the patient or authorized representative who has  indicated his/her understanding and acceptance.       Plan Discussed with: CRNA  Anesthesia Plan Comments:         Anesthesia Quick Evaluation  Patient Active Problem List   Diagnosis Date Noted  . Skin lesion 02/14/2021  . Pulmonary nodule, left 01/22/2021  . Vitamin B12 deficiency 01/22/2021  . Hypertension 01/21/2021  . Memory deficit 01/21/2021  . Primary lung adenocarcinoma, right ([place]) 01/20/2020  . S/P lobectomy of lung 01/20/2020  . Advanced care planning/counseling discussion 06/26/2017  . 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 COPD with emphysema ([place]) 02/12/2016  . Thoracic aortic atherosclerosis ([place]) 02/12/2016  . Health maintenance examination 02/01/2016  . Microscopic colitis 06/05/2015  . Abnormal MRI of head 10/02/2011  . Ex-smoker 04/30/2007  . Osteopenia 04/30/2007    CBC Latest Ref Rng & Units 01/19/2021 01/22/2020 01/21/2020  WBC 4.0 - 10.5 K/uL 5.2 6.7 9.2  Hemoglobin 12.0 - 15.0 g/dL 13.3 11.5(L) 11.6(L)  Hematocrit 36.0 - 46.0 % 40.2 36.4 35.0(L)  Platelets 150.0 - 400.0 K/uL 244.0 185 164   BMP Latest Ref Rng & Units 01/19/2021 01/22/2020 01/21/2020  Glucose 70 - 99 mg/dL 83 108(H) 119(H)  BUN 6 - 23 mg/dL _0 Creatinine 0.40 - 1.20 mg/dL 0.77 0.96 0.67  Sodium 135 - 145 mEq/L 142 141 136  Potassium 3.5 - 5.1 mEq/L 4.4 3.9 3.8  Chloride 96 - 112 mEq/L 106 106 101  CO2 19 - 32 mEq/L _1 Calcium 8.4 -  10.5 mg/dL 9.3 8.6(L) 8.6(L)    Risks and benefits of anesthesia discussed at length, patient or surrogate demonstrates understanding. Appropriately NPO. Plan to proceed with anesthesia.  Champ Mungo, MD 03/14/21

## 2021-03-14 NOTE — Transfer of Care (Signed)
Immediate Anesthesia Transfer of Care Note  Patient: Lindsay Mason  Procedure(s) Performed: CATARACT EXTRACTION PHACO AND INTRAOCULAR LENS PLACEMENT (IOC) LEFT 4.07 00:44.6 (Left Eye)  Patient Location: PACU  Anesthesia Type: MAC  Level of Consciousness: awake, alert  and patient cooperative  Airway and Oxygen Therapy: Patient Spontanous Breathing and Patient connected to supplemental oxygen  Post-op Assessment: Post-op Vital signs reviewed, Patient's Cardiovascular Status Stable, Respiratory Function Stable, Patent Airway and No signs of Nausea or vomiting  Post-op Vital Signs: Reviewed and stable  Complications: No complications documented.

## 2021-03-14 NOTE — Anesthesia Postprocedure Evaluation (Signed)
Anesthesia Post Note  Patient: Lindsay [facility]  Procedure(s) Performed: CATARACT EXTRACTION PHACO AND INTRAOCULAR LENS PLACEMENT (IOC) LEFT 4.07 00:44.6 (Left Eye)     Patient location during evaluation: PACU Anesthesia Type: MAC Level of consciousness: awake and alert Pain management: pain level controlled Vital Signs Assessment: post-procedure vital signs reviewed and stable Respiratory status: spontaneous breathing, nonlabored ventilation, respiratory function stable and patient connected to nasal cannula oxygen Cardiovascular status: stable and blood pressure returned to baseline Postop Assessment: no apparent nausea or vomiting Anesthetic complications: no   No complications documented.  Sinda Du

## 2021-03-14 NOTE — Op Note (Signed)
  OPERATIVE NOTE  Lindsay [place] [id number] 03/14/2021   PREOPERATIVE DIAGNOSIS:  Nuclear sclerotic cataract left eye. H25.12   POSTOPERATIVE DIAGNOSIS:    Nuclear sclerotic cataract left eye.     PROCEDURE:  Phacoemusification with posterior chamber intraocular lens placement of the left eye  Ultrasound time: Procedure(s): CATARACT EXTRACTION PHACO AND INTRAOCULAR LENS PLACEMENT (IOC) LEFT 4.07 00:44.6 (Left)  LENS:   Implant Name Type Inv. Item Serial No. Manufacturer Lot No. LRB No. Used Action  LENS IOL TECNIS EYHANCE 21.0 - [id number] Intraocular Lens LENS IOL TECNIS EYHANCE 21.0 [id number] JOHNSON   Left 1 Implanted      SURGEON:  Wyonia Hough, MD   ANESTHESIA:  Topical with tetracaine drops and 2% Xylocaine jelly, augmented with 1% preservative-free intracameral lidocaine.    COMPLICATIONS:  None.   DESCRIPTION OF PROCEDURE:  The patient was identified in the holding room and transported to the operating room and placed in the supine position under the operating microscope.  The left eye was identified as the operative eye and it was prepped and draped in the usual sterile ophthalmic fashion.   A 1 millimeter clear-corneal paracentesis was made at the 1:30 position.  0.5 ml of preservative-free 1% lidocaine was injected into the anterior chamber.  The anterior chamber was filled with Viscoat viscoelastic.  A 2.4 millimeter keratome was used to make a near-clear corneal incision at the 10:30 position.  .  A curvilinear capsulorrhexis was made with a cystotome and capsulorrhexis forceps.  Balanced salt solution was used to hydrodissect and hydrodelineate the nucleus.   Phacoemulsification was then used in stop and chop fashion to remove the lens nucleus and epinucleus.  The remaining cortex was then removed using the irrigation and aspiration handpiece. Provisc was then placed into the capsular bag to distend it for lens placement.  A lens was then injected into the  capsular bag.  The remaining viscoelastic was aspirated.   Wounds were hydrated with balanced salt solution.  The anterior chamber was inflated to a physiologic pressure with balanced salt solution.  No wound leaks were noted. Vigamox 0.2 ml of a 1mg  per ml solution was injected into the anterior chamber for a dose of 0.2 mg of intracameral antibiotic at the completion of the case.   Timolol and Brimonidine drops were applied to the eye.  The patient was taken to the recovery room in stable condition without complications of anesthesia or surgery.  [name] 03/14/2021, 1:25 PM

## 2021-03-15 ENCOUNTER — Encounter: Payer: Self-pay | Admitting: Ophthalmology

## 2021-04-05 DIAGNOSIS — E669 Obesity, unspecified: Secondary | ICD-10-CM | POA: Diagnosis not present

## 2021-04-05 DIAGNOSIS — J449 Chronic obstructive pulmonary disease, unspecified: Secondary | ICD-10-CM | POA: Diagnosis not present

## 2021-04-05 DIAGNOSIS — R030 Elevated blood-pressure reading, without diagnosis of hypertension: Secondary | ICD-10-CM | POA: Diagnosis not present

## 2021-04-05 DIAGNOSIS — Z6832 Body mass index (BMI) 32.0-32.9, adult: Secondary | ICD-10-CM | POA: Diagnosis not present

## 2021-04-05 DIAGNOSIS — Z008 Encounter for other general examination: Secondary | ICD-10-CM | POA: Diagnosis not present

## 2021-04-05 DIAGNOSIS — Z85118 Personal history of other malignant neoplasm of bronchus and lung: Secondary | ICD-10-CM | POA: Diagnosis not present

## 2021-04-05 DIAGNOSIS — Z87891 Personal history of nicotine dependence: Secondary | ICD-10-CM | POA: Diagnosis not present

## 2021-04-05 DIAGNOSIS — Z880 Allergy status to penicillin: Secondary | ICD-10-CM | POA: Diagnosis not present

## 2021-04-05 DIAGNOSIS — I739 Peripheral vascular disease, unspecified: Secondary | ICD-10-CM | POA: Diagnosis not present

## 2021-04-05 DIAGNOSIS — G3184 Mild cognitive impairment, so stated: Secondary | ICD-10-CM | POA: Diagnosis not present

## 2021-05-24 ENCOUNTER — Other Ambulatory Visit: Payer: Self-pay | Admitting: Thoracic Surgery (Cardiothoracic Vascular Surgery)

## 2021-05-24 DIAGNOSIS — C3491 Malignant neoplasm of unspecified part of right bronchus or lung: Secondary | ICD-10-CM

## 2021-06-19 ENCOUNTER — Encounter: Payer: Self-pay | Admitting: Family Medicine

## 2021-06-19 ENCOUNTER — Ambulatory Visit (INDEPENDENT_AMBULATORY_CARE_PROVIDER_SITE_OTHER): Payer: Medicare HMO | Admitting: Family Medicine

## 2021-06-19 ENCOUNTER — Other Ambulatory Visit: Payer: Self-pay

## 2021-06-19 VITALS — BP 140/104 | HR 88 | Temp 98.1°F | Ht 65.0 in | Wt 146.5 lb

## 2021-06-19 DIAGNOSIS — E538 Deficiency of other specified B group vitamins: Secondary | ICD-10-CM | POA: Diagnosis not present

## 2021-06-19 DIAGNOSIS — I10 Essential (primary) hypertension: Secondary | ICD-10-CM

## 2021-06-19 DIAGNOSIS — R413 Other amnesia: Secondary | ICD-10-CM | POA: Diagnosis not present

## 2021-06-19 LAB — FOLATE: Folate: 12.8 ng/mL (ref >5.9)

## 2021-06-19 LAB — VITAMIN B12: Vitamin B-12: 994 pg/mL — ABNORMAL HIGH (ref 211–911)

## 2021-06-19 MED ORDER — VITAMIN B-12 1000 MCG PO TABS: 1000.0000 ug | ORAL_TABLET | Freq: Every day | ORAL | 3 refills | Status: DC

## 2021-06-19 MED ORDER — AMLODIPINE BESYLATE 5 MG PO TABS: 5.0000 mg | ORAL_TABLET | Freq: Every day | ORAL | 3 refills | Status: DC

## 2021-06-19 MED ORDER — CYANOCOBALAMIN 1000 MCG/ML IJ SOLN
1000.0000 ug | Freq: Once | INTRAMUSCULAR | Status: AC
  Administered 2021-06-19: 1000 ug via INTRAMUSCULAR

## 2021-06-19 MED ORDER — ALBUTEROL SULFATE HFA 108 (90 BASE) MCG/ACT IN AERS: INHALATION_SPRAY | RESPIRATORY_TRACT | 0 refills | Status: DC

## 2021-06-19 MED ORDER — DONEPEZIL HCL 5 MG PO TABS: 5.0000 mg | ORAL_TABLET | Freq: Every day | ORAL | 6 refills | Status: DC

## 2021-06-19 NOTE — Progress Notes (Signed)
Patient ID: Lindsay Mason, female    DOB: Jun 18, 1951, 70 y.o.   MRN: [id number]  This visit was conducted in person.  BP (!) 140/104   Pulse 88   Temp 98.1 F (36.7 C) (Temporal)   Ht 5\' 5"  (1.651 m)   Wt 146 lb 8 oz (66.5 kg)   SpO2 98%   BMI 24.38 kg/m    CC: 4 mo f/u visit  Subjective:   HPI: Lindsay Mason is a 70 y.o. female presenting on 06/19/2021 for Hypertension (Here for 4 mo f/u.  Pt states she didn't realize she was to continue taking amlodipine.  Explained the directions are for her to take daily.  Says she will contact the pharmacy to get refill. )   See prior note for details. HTN - Compliant with current antihypertensive regimen of amlodipine 5mg  - she stopped after taking the full bottle - didn't think she needed to take for longer. Does not check blood pressures at home. No low blood pressure readings or symptoms of dizziness/syncope. Denies HA, vision changes, CP/tightness, SOB, leg swelling.   Memory difficulties in setting of low vitamin B12 - has been taking 1059mcg vit B12 daily. She has been taking b12 orally regularly.   MMSE: 20-22/30 (0/3 recall, 2/3 with cue)  Clock Drawing Score: 2/4      Relevant past medical, surgical, family and social history reviewed and updated as indicated. Interim medical history since our last visit reviewed. Allergies and medications reviewed and updated. Outpatient Medications Prior to Visit  Medication Sig Dispense Refill   Multiple Vitamin (MULTIVITAMIN) tablet Take 1 tablet by mouth daily.     albuterol (VENTOLIN HFA) 108 (90 Base) MCG/ACT inhaler INHALE TWO PUFFS BY MOUTH INTO LUNGS EVERY SIX HOURS AS NEEDED FOR SHORTNESS OF BREATH OR wheezing 8.5 g 0   vitamin B-12 (CYANOCOBALAMIN) 1000 MCG tablet Take 1 tablet (1,000 mcg total) by mouth daily.     amLODipine (NORVASC) 5 MG tablet Take 1 tablet (5 mg total) by mouth daily. (Patient not taking: Reported on 06/19/2021) 90 tablet 3   No facility-administered  medications prior to visit.     Per HPI unless specifically indicated in ROS section below Review of Systems  Objective:  BP (!) 140/104   Pulse 88   Temp 98.1 F (36.7 C) (Temporal)   Ht 5\' 5"  (1.651 m)   Wt 146 lb 8 oz (66.5 kg)   SpO2 98%   BMI 24.38 kg/m   Wt Readings from Last 3 Encounters:  06/19/21 146 lb 8 oz (66.5 kg)  03/14/21 139 lb 15.9 oz (63.5 kg)  02/21/21 140 lb (63.5 kg)      Physical Exam Vitals and nursing note reviewed.  Constitutional:      Appearance: Normal appearance. She is not ill-appearing.  Cardiovascular:     Rate and Rhythm: Normal rate and regular rhythm.     Pulses: Normal pulses.     Heart sounds: Normal heart sounds. No murmur heard. Pulmonary:     Effort: Pulmonary effort is normal. No respiratory distress.     Breath sounds: Normal breath sounds. No wheezing, rhonchi or rales.  Skin:    General: Skin is warm and dry.     Findings: No rash.  Neurological:     Mental Status: She is alert.  Psychiatric:        Mood and Affect: Mood normal.        Behavior: Behavior normal.  Results for orders placed or performed in visit on 06/19/21  Vitamin B12  Result Value Ref Range   Vitamin B-12 994 (H) 211 - 911 pg/mL  Folate  Result Value Ref Range   Folate 12.8 >5.9 ng/mL   Assessment & Plan:  This visit occurred during the SARS-CoV-2 public health emergency.  Safety protocols were in place, including screening questions prior to the visit, additional usage of staff PPE, and extensive cleaning of exam room while observing appropriate contact time as indicated for disinfecting solutions.   Problem List Items Addressed This Visit     Hypertension - Primary    BP remaining elevated however she stopped amlodipine after finishing first bottle. Discussed ongoing need for medication - new Rx sent to pharmacy. Continue amlodipine 5mg  daily.       Relevant Medications   amLODipine (NORVASC) 5 MG tablet   Memory deficit    Overall  improvement in MMSE since starting B12 however significant memory deficit persists. Anticipate mild cognitive impairment as she continues to function independently overall well. Discussed option of anticholinergic to help slow progression of memory difficulties - will start aricept 5mg  daily, monitoring for GI upset or arrhythmia. RTC 3 mo f/u visit.       Vitamin B12 deficiency    Some improvement in memory noted with regular vit b12 replacement. Continue this. Update vitamin levels, IM B12 shot 1048mcg today to boost levels.         Meds ordered this encounter  Medications   cyanocobalamin ((VITAMIN B-12)) injection 1,000 mcg   vitamin B-12 (CYANOCOBALAMIN) 1000 MCG tablet    Sig: Take 1 tablet (1,000 mcg total) by mouth daily.    Dispense:  90 tablet    Refill:  3   amLODipine (NORVASC) 5 MG tablet    Sig: Take 1 tablet (5 mg total) by mouth daily.    Dispense:  90 tablet    Refill:  3   albuterol (VENTOLIN HFA) 108 (90 Base) MCG/ACT inhaler    Sig: INHALE TWO PUFFS BY MOUTH INTO LUNGS EVERY SIX HOURS AS NEEDED FOR SHORTNESS OF BREATH OR wheezing    Dispense:  8.5 g    Refill:  0   donepezil (ARICEPT) 5 MG tablet    Sig: Take 1 tablet (5 mg total) by mouth at bedtime. for memory    Dispense:  30 tablet    Refill:  6   No orders of the defined types were placed in this encounter.   Patient Instructions  Memory seems to be doing some better today after vitamin B12 tablets but still some residual impairment noted - continue vitamin B12 replacement. Start memory medicine called aricept (donepezil) 5mg  daily - watch for GI upset on this medicine.  Labs today B12 shot today  Make sure to take your amlodipine every day for blood pressure control - blood pressure was too high today.  Return in another 3 months   Follow up plan: Return in about 3 months (around 09/18/2021), or if symptoms worsen or fail to improve.  Ria Bush, MD

## 2021-06-19 NOTE — Patient Instructions (Addendum)
Memory seems to be doing some better today after vitamin B12 tablets but still some residual impairment noted - continue vitamin B12 replacement. Start memory medicine called aricept (donepezil) 5mg  daily - watch for GI upset on this medicine.  Labs today B12 shot today  Make sure to take your amlodipine every day for blood pressure control - blood pressure was too high today.  Return in another 3 months

## 2021-06-20 NOTE — Assessment & Plan Note (Signed)
Some improvement in memory noted with regular vit b12 replacement. Continue this. Update vitamin levels, IM B12 shot 1033mcg today to boost levels.

## 2021-06-20 NOTE — Assessment & Plan Note (Addendum)
Overall improvement in MMSE since starting B12 however significant memory deficit persists. Anticipate mild cognitive impairment as she continues to function independently overall well. Discussed option of anticholinergic to help slow progression of memory difficulties - will start aricept 5mg  daily, monitoring for GI upset or arrhythmia. RTC 3 mo f/u visit.

## 2021-06-20 NOTE — Assessment & Plan Note (Signed)
BP remaining elevated however she stopped amlodipine after finishing first bottle. Discussed ongoing need for medication - new Rx sent to pharmacy. Continue amlodipine 5mg  daily.

## 2021-06-24 LAB — VITAMIN B1: Vitamin B1 (Thiamine): 12 nmol/L (ref 8–30)

## 2021-07-13 ENCOUNTER — Ambulatory Visit: Payer: Medicare HMO | Admitting: Thoracic Surgery (Cardiothoracic Vascular Surgery)

## 2021-07-13 ENCOUNTER — Other Ambulatory Visit: Payer: Self-pay

## 2021-07-13 ENCOUNTER — Ambulatory Visit
Admission: RE | Admit: 2021-07-13 | Discharge: 2021-07-13 | Disposition: A | Discharge status: Home / Self Care | Payer: Medicare HMO | Source: Ambulatory Visit | Attending: Thoracic Surgery (Cardiothoracic Vascular Surgery) | Admitting: Thoracic Surgery (Cardiothoracic Vascular Surgery)

## 2021-07-13 VITALS — BP 116/112 | HR 82 | Resp 18 | Ht 65.0 in | Wt 153.0 lb

## 2021-07-13 DIAGNOSIS — J439 Emphysema, unspecified: Secondary | ICD-10-CM | POA: Diagnosis not present

## 2021-07-13 DIAGNOSIS — R911 Solitary pulmonary nodule: Secondary | ICD-10-CM | POA: Diagnosis not present

## 2021-07-13 DIAGNOSIS — C3491 Malignant neoplasm of unspecified part of right bronchus or lung: Secondary | ICD-10-CM

## 2021-07-13 DIAGNOSIS — C3490 Malignant neoplasm of unspecified part of unspecified bronchus or lung: Secondary | ICD-10-CM | POA: Diagnosis not present

## 2021-07-13 NOTE — Progress Notes (Signed)
      Lindsay Mason [address]       Lindsay Mason,Lindsay Mason [id number]             [phone]        Lindsay [name] Mason Medical Record #[id number] Date of Birth: 06-29-1951  Referring: Lindsay Bush, MD Primary Care: Lindsay Bush, MD Primary Cardiologist:None  Reason for visit:   follow-up  History of Present Illness:     Lindsay Mason presents in follow-up today.  She underwent a robotic assisted right lower lobectomy and April 2021 for a pT1b N0 M0 adenocarcinoma, stage Ia.  She has had persisting groundglass opacities in the left lower lobe.  From a respiratory status she is doing well however she is battling some memory issues and to being evaluated by neurology for this.  Physical Exam: BP (!) 116/112 (BP Location: Right Arm, Patient Position: Sitting)   Pulse 82   Resp 18   Ht 5\' 5"  (1.651 m)   Wt 153 lb (69.4 kg)   SpO2 97% Comment: RA  BMI 25.46 kg/m   Alert NAD  No peripheral edema   Diagnostic Studies & Laboratory data: CT chest: IMPRESSION: 1. A total of 3 ground-glass densities are present in the left lower lobe and appear stable from 12/29/2020 and recent prior exams, although have progressed if we compare back all the way to 02/22/2016. Indolent low-grade adenocarcinoma remains a possibility and continued surveillance is likely warranted. 2. Other imaging findings of potential clinical significance: Aortic Atherosclerosis (ICD10-I70.0). Emphysema (ICD10-J43.9). Postoperative findings in the right lung. Left adrenal adenoma.    Assessment / Plan:   70 year old female with history of stage Ia right lower lobe adenocarcinoma.  She also has 3 stable groundglass opacities in the left lower lobe which have been present since March 2022.  Given the concern for low-grade adenocarcinoma, I will touch base with Dr. Valeta Harms to see if she requires biopsy.  For now we will plan to continue surveillance.   [name] 07/13/2021 6:27 PM

## 2021-07-20 ENCOUNTER — Encounter: Payer: Self-pay | Admitting: *Deleted

## 2021-08-27 ENCOUNTER — Ambulatory Visit: Payer: Medicare HMO | Admitting: Pulmonary Disease

## 2021-08-27 ENCOUNTER — Encounter: Payer: Self-pay | Admitting: Pulmonary Disease

## 2021-08-27 ENCOUNTER — Other Ambulatory Visit: Payer: Self-pay

## 2021-08-27 VITALS — BP 122/64 | HR 89 | Temp 98.0°F | Ht 63.0 in | Wt 156.2 lb

## 2021-08-27 DIAGNOSIS — Z902 Acquired absence of lung [part of]: Secondary | ICD-10-CM

## 2021-08-27 DIAGNOSIS — R918 Other nonspecific abnormal finding of lung field: Secondary | ICD-10-CM | POA: Diagnosis not present

## 2021-08-27 DIAGNOSIS — R911 Solitary pulmonary nodule: Secondary | ICD-10-CM

## 2021-08-27 DIAGNOSIS — Z9889 Other specified postprocedural states: Secondary | ICD-10-CM

## 2021-08-27 DIAGNOSIS — C3491 Malignant neoplasm of unspecified part of right bronchus or lung: Secondary | ICD-10-CM | POA: Diagnosis not present

## 2021-08-27 NOTE — Progress Notes (Signed)
Synopsis: Referred in November 2022 lung nodule by [name], MD  Subjective:   PATIENT ID: Lindsay Mason GENDER: female DOB: 1950/12/23, MRN: [id number]  Chief Complaint  Patient presents with   Follow-up    This is a 70 year old female, past medical history of moderate COPD, centrilobular emphysema, microscopic colitis, community-acquired pneumonia in 2013.  In 2021 patient had a slowly changing some solid groundglass lesion within the right lower lobe and underwent a navigational bronchoscopy with dye marking and single anesthetic event for follow-up lobectomy by Dr. Kipp Brood.  This was diagnosed as a adenocarcinoma.  She has had subsequent follow-up images.  Serial imaging has showed 3 groundglass lesions within the left lower lobe.  Lesion in the left lower lobe has showed some changes and progression to a larger size in comparison to imaging dating all the way back to 2017.  We reviewed the CT images today in the office about the concern for potential second malignancy.   Past Medical History:  Diagnosis Date   Adrenal hyperplasia ([place]) 12/2011   on CT   Allergy    CAP (community acquired pneumonia) 12/2011   Chest pain    normal ETT 10/2011   COPD with emphysema ([place]) 02/2016   by CT - moderate centrilobular and paraseptal   Ex-smoker 08/2014   currently using e cig   Microscopic colitis 06/2015   lymphocytic by colonoscopy - started entocort [name]   Osteoporosis, unspecified    Scoliosis of lumbar spine    Thoracic aortic atherosclerosis ([place]) 02/2016   by CT     Family History  Problem Relation Age of Onset   Osteoporosis Other        great grandmother   Stroke Maternal Grandmother    Coronary artery disease Maternal Grandfather 63   Diabetes Maternal Grandfather    Coronary artery disease Paternal Grandmother 82   Coronary artery disease Paternal Grandfather 93   Lung cancer Maternal Aunt      Past Surgical History:  Procedure Laterality Date    CATARACT EXTRACTION W/PHACO Right 02/21/2021   Procedure: CATARACT EXTRACTION PHACO AND INTRAOCULAR LENS PLACEMENT ([place]) RIGHT;  Surgeon: Leandrew Koyanagi, MD;  Location: [facility];  Service: Ophthalmology;  Laterality: Right;  cde 4.75 00:41.1 minutes 11.5%   CATARACT EXTRACTION W/PHACO Left 03/14/2021   Procedure: CATARACT EXTRACTION PHACO AND INTRAOCULAR LENS PLACEMENT ([facility]) LEFT 4.07 00:44.6;  Surgeon: Leandrew Koyanagi, MD;  Location: [facility];  Service: Ophthalmology;  Laterality: Left;   COLONOSCOPY  06/2015   microscopic colitis [name]   DEXA  10/2008   Osteoporosis   DEXA  02/2014   T score osteopenia hip, osteoporosis spine, scoliosis   ELECTROMAGNETIC NAVIGATION BROCHOSCOPY  01/20/2020   Procedure: ELECTROMAGNETIC NAVIGATION BRONCHOSCOPY;  Surgeon: [name], DO;  Location: [facility];  Service: Pulmonary;;   ETT  10/2011   WNL, no evidence of ischemia, excellent exercise tolerance   EXPLORATORY LAPAROTOMY     INTERCOSTAL NERVE BLOCK Right 01/20/2020   Procedure: Intercostal Nerve Block;  Surgeon: [name], MD;  Location: [facility];  Service: Thoracic;  Laterality: Right;   MRI head  07/2011   multiple foci deep and subcortical white matter, chronic ischemic vs demyelinating, no active disease   NODE DISSECTION  01/20/2020   Procedure: Node Dissection;  Surgeon: [name], MD;  Location: [facility];  Service: Thoracic;;   SUBMUCOSAL TATTOO INJECTION  01/20/2020   Procedure: SUBSTITIAL TATTOO INJECTION;  Surgeon: [name], DO;  Location: [facility];  Service: Pulmonary;;   TONSILLECTOMY AND ADENOIDECTOMY     Vein removal     removed from legs   VIDEO ASSISTED THORACOSCOPY (VATS)/WEDGE RESECTION Right 01/20/2020   XI ROBOTIC ASSISTED THORASCOPY-RIGHT LOWER LOBE WEDGE RESECTION, RIGHT LOWER LOBECTOMYRightGeneral   VIDEO BRONCHOSCOPY  01/20/2020   Procedure: VIDEO BRONCHOSCOPY WITH FLUORO;  Surgeon: [name], DO;  Location:  [facility];  Service: Pulmonary;;    Social History   Socioeconomic History   Marital status: Married    Spouse name: Not on file   Number of children: 3   Years of education: Not on file   Highest education level: Not on file  Occupational History   Occupation: retired  Tobacco Use   Smoking status: Former    Packs/day: 1.00    Years: 30.00    Pack years: 30.00    Types: Cigarettes    Start date: 10/14/1978    Quit date: 07/15/2015    Years since quitting: 6.1   Smokeless tobacco: Never   Tobacco comments:    off and on smoker, now e-cigarettes  Vaping Use   Vaping Use: Some days   Devices: uses occasionally - no nicotine  Substance and Sexual Activity   Alcohol use: Yes    Alcohol/week: 0.0 standard drinks    Comment: social wine once a week   Drug use: No   Sexual activity: Not on file  Other Topics Concern   Not on file  Social History Narrative   Caffeine: 1 cup coffee/day   Lives with husband, outside dog   Smoking vapes.   Pt tans regularly - at her pool and in her garden. Wears sunscreen   Social Determinants of Radio broadcast assistant Strain: Low Risk    Difficulty of Paying Living Expenses: Not hard at all  Food Insecurity: No Food Insecurity   Worried About Charity fundraiser in the Last Year: Never true   Arboriculturist in the Last Year: Never true  Transportation Needs: No Transportation Needs   Lack of Transportation (Medical): No   Lack of Transportation (Non-Medical): No  Physical Activity: Sufficiently Active   Days of Exercise per Week: 7 days   Minutes of Exercise per Session: 60 min  Stress: No Stress Concern Present   Feeling of Stress : Not at all  Social Connections: Not on file  Intimate Partner Violence: Not At Risk   Fear of Current or Ex-Partner: No   Emotionally Abused: No   Physically Abused: No   Sexually Abused: No     Allergies  Allergen Reactions   Penicillins Swelling    REACTION: swelling tongue - States has  taken Cephalosporins without difficulty in past Did it involve swelling of the face/tongue/throat, SOB, or low BP? Yes Did it involve sudden or severe rash/hives, skin peeling, or any reaction on the inside of your mouth or nose? No Did you need to seek medical attention at a hospital or doctor's office? Yes When did it last happen? Childhood reaction If all above answers are "NO", may proceed with cephalosporin use.      Outpatient Medications Prior to Visit  Medication Sig Dispense Refill   albuterol (VENTOLIN HFA) 108 (90 Base) MCG/ACT inhaler INHALE TWO PUFFS BY MOUTH INTO LUNGS EVERY SIX HOURS AS NEEDED FOR SHORTNESS OF BREATH OR wheezing 8.5 g 0   amLODipine (NORVASC) 5 MG tablet Take 1 tablet (5 mg total) by mouth daily. 90 tablet 3  donepezil (ARICEPT) 5 MG tablet Take 1 tablet (5 mg total) by mouth at bedtime. for memory 30 tablet 6   Multiple Vitamin (MULTIVITAMIN) tablet Take 1 tablet by mouth daily.     vitamin B-12 (CYANOCOBALAMIN) 1000 MCG tablet Take 1 tablet (1,000 mcg total) by mouth daily. 90 tablet 3   No facility-administered medications prior to visit.    Review of Systems  Constitutional:  Negative for chills, fever, malaise/fatigue and weight loss.  HENT:  Negative for hearing loss, sore throat and tinnitus.   Eyes:  Negative for blurred vision and double vision.  Respiratory:  Positive for shortness of breath. Negative for cough, hemoptysis, sputum production, wheezing and stridor.   Cardiovascular:  Negative for chest pain, palpitations, orthopnea, leg swelling and PND.  Gastrointestinal:  Negative for abdominal pain, constipation, diarrhea, heartburn, nausea and vomiting.  Genitourinary:  Negative for dysuria, hematuria and urgency.  Musculoskeletal:  Negative for joint pain and myalgias.  Skin:  Negative for itching and rash.  Neurological:  Negative for dizziness, tingling, weakness and headaches.  Endo/Heme/Allergies:  Negative for environmental allergies.  Does not bruise/bleed easily.  Psychiatric/Behavioral:  Negative for depression. The patient is not nervous/anxious and does not have insomnia.   All other systems reviewed and are negative.   Objective:  Physical Exam Vitals reviewed.  Constitutional:      General: She is not in acute distress.    Appearance: She is well-developed.  HENT:     Head: Normocephalic and atraumatic.  Eyes:     General: No scleral icterus.    Conjunctiva/sclera: Conjunctivae normal.     Pupils: Pupils are equal, round, and reactive to light.  Neck:     Vascular: No JVD.     Trachea: No tracheal deviation.  Cardiovascular:     Rate and Rhythm: Normal rate and regular rhythm.     Heart sounds: Normal heart sounds. No murmur heard. Pulmonary:     Effort: Pulmonary effort is normal. No tachypnea, accessory muscle usage or respiratory distress.     Breath sounds: Normal breath sounds. No stridor. No wheezing, rhonchi or rales.  Abdominal:     General: Bowel sounds are normal. There is no distension.     Palpations: Abdomen is soft.     Tenderness: There is no abdominal tenderness.  Musculoskeletal:        General: No tenderness.     Cervical back: Neck supple.  Lymphadenopathy:     Cervical: No cervical adenopathy.  Skin:    General: Skin is warm and dry.     Capillary Refill: Capillary refill takes less than 2 seconds.     Findings: No rash.  Neurological:     Mental Status: She is alert and oriented to person, place, and time.  Psychiatric:        Behavior: Behavior normal.     Vitals:   08/27/21 1415  BP: 122/64  Pulse: 89  Temp: 98 F (36.7 C)  TempSrc: Oral  SpO2: 100%  Weight: 156 lb 3.2 oz (70.9 kg)  Height: 5\' 3"  (1.6 m)   100% on RA BMI Readings from Last 3 Encounters:  08/27/21 27.67 kg/m  07/13/21 25.46 kg/m  06/19/21 24.38 kg/m   Wt Readings from Last 3 Encounters:  08/27/21 156 lb 3.2 oz (70.9 kg)  07/13/21 153 lb (69.4 kg)  06/19/21 146 lb 8 oz (66.5 kg)      CBC    Component Value Date/Time   WBC 5.2 01/19/2021 1310  RBC 4.24 01/19/2021 1310   HGB 13.3 01/19/2021 1310   HCT 40.2 01/19/2021 1310   PLT 244.0 01/19/2021 1310   MCV 94.8 01/19/2021 1310   MCH 31.0 01/22/2020 0226   MCHC 33.0 01/19/2021 1310   RDW 13.9 01/19/2021 1310   LYMPHSABS 1.5 01/19/2021 1310   MONOABS 0.3 01/19/2021 1310   EOSABS 0.1 01/19/2021 1310   BASOSABS 0.1 01/19/2021 1310    Chest Imaging: September 2022 CT chest: Left lower lobe groundglass lesions.  Slowly enlarging since 2017. The patient's images have been independently reviewed by me.    Pulmonary Functions Testing Results: PFT Results Latest Ref Rng & Units 01/03/2020  FVC-Pre L 2.62  FVC-Predicted Pre % 96  FVC-Post L 2.76  FVC-Predicted Post % 101  Pre FEV1/FVC % % 65  Post FEV1/FCV % % 69  FEV1-Pre L 1.71  FEV1-Predicted Pre % 83  FEV1-Post L 1.91  DLCO uncorrected ml/min/mmHg 17.67  DLCO UNC% % 100  DLCO corrected ml/min/mmHg 17.21  DLCO COR %Predicted % 97  DLVA Predicted % 92  TLC L 5.46  TLC % Predicted % 118  RV % Predicted % 144    FeNO:   Pathology:   Echocardiogram:   Heart Catheterization:     Assessment & Plan:     ICD-10-CM   1. Lung nodule  R91.1 Pulmonary function test    CT Super D Chest Wo Contrast    2. Ground glass opacity present on imaging of lung  R91.8 Pulmonary function test    CT Super D Chest Wo Contrast    3. Primary lung adenocarcinoma, right ([place])  C34.91     4. S/P robotic assisted lobectomy of lung  Z90.2     5. S/P navigational bronchoscopy   Z98.890       Discussion:  This is a 70 year old female, former smoker, centrilobular emphysema currently using as needed albuterol.  She had a right lower lobe lung nodule that was slowly enlarging groundglass in nature.  Underwent a combined navigational bronchoscopy followed by robotic lobectomy single anesthetic event in the spring 2021 diagnosed with a adenocarcinoma of the right  lung.  Patient has not needed any chemo.  Now been under image surveillance.  Now has new left lower lobe groundglass lesions.  Plan: Today in the office we reviewed her images. We discussed the risk of being malignancy in the left lung. I do think that due to the groundglass nature and no significant subsolid component that I can see I think continued surveillance is warranted. We will have a repeat noncontrasted CT scan of the chest in 6 months. If there is any continued change or evolution of the lesion it most likely represents a new primary lung cancer such as a indolent adenocarcinoma. We discussed this in detail today. Patient is agreeable to this plan and if the lesion does grow or change in 6 months we could consider tissue biopsy. Patient is also agreeable to this.    Current Outpatient Medications:    albuterol (VENTOLIN HFA) 108 (90 Base) MCG/ACT inhaler, INHALE TWO PUFFS BY MOUTH INTO LUNGS EVERY SIX HOURS AS NEEDED FOR SHORTNESS OF BREATH OR wheezing, Disp: 8.5 g, Rfl: 0   amLODipine (NORVASC) 5 MG tablet, Take 1 tablet (5 mg total) by mouth daily., Disp: 90 tablet, Rfl: 3   donepezil (ARICEPT) 5 MG tablet, Take 1 tablet (5 mg total) by mouth at bedtime. for memory, Disp: 30 tablet, Rfl: 6   Multiple Vitamin (MULTIVITAMIN) tablet, Take  1 tablet by mouth daily., Disp: , Rfl:    vitamin B-12 (CYANOCOBALAMIN) 1000 MCG tablet, Take 1 tablet (1,000 mcg total) by mouth daily., Disp: 90 tablet, Rfl: 3  I spent 42 minutes dedicated to the care of this patient on the date of this encounter to include pre-visit review of records, face-to-face time with the patient discussing conditions above, post visit ordering of testing, clinical documentation with the electronic health record, making appropriate referrals as documented, and communicating necessary findings to members of the patients care team.    [name], DO [facility] 08/27/2021 2:22 PM

## 2021-08-27 NOTE — Patient Instructions (Signed)
Thank you for visiting Dr. Valeta Harms at [facility]. Today we recommend the following:  Orders Placed This Encounter  Procedures   CT Super D Chest Wo Contrast   Pulmonary function test   Ok to continue albuterol as needed   Return in about 6 months (around 02/24/2022) for with Eric Form, NP, or Dr. Valeta Harms.    Please do your part to reduce the spread of COVID-19.

## 2021-09-19 ENCOUNTER — Ambulatory Visit: Payer: Medicare HMO | Admitting: Family Medicine

## 2021-11-09 DIAGNOSIS — Z961 Presence of intraocular lens: Secondary | ICD-10-CM | POA: Diagnosis not present

## 2021-11-22 ENCOUNTER — Other Ambulatory Visit: Payer: Self-pay | Admitting: Thoracic Surgery (Cardiothoracic Vascular Surgery)

## 2021-11-22 DIAGNOSIS — R911 Solitary pulmonary nodule: Secondary | ICD-10-CM

## 2021-12-21 DIAGNOSIS — Z008 Encounter for other general examination: Secondary | ICD-10-CM | POA: Diagnosis not present

## 2021-12-21 DIAGNOSIS — G3184 Mild cognitive impairment, so stated: Secondary | ICD-10-CM | POA: Diagnosis not present

## 2021-12-21 DIAGNOSIS — Z87891 Personal history of nicotine dependence: Secondary | ICD-10-CM | POA: Diagnosis not present

## 2021-12-21 DIAGNOSIS — Z85118 Personal history of other malignant neoplasm of bronchus and lung: Secondary | ICD-10-CM | POA: Diagnosis not present

## 2021-12-21 DIAGNOSIS — R030 Elevated blood-pressure reading, without diagnosis of hypertension: Secondary | ICD-10-CM | POA: Diagnosis not present

## 2021-12-21 DIAGNOSIS — J45909 Unspecified asthma, uncomplicated: Secondary | ICD-10-CM | POA: Diagnosis not present

## 2021-12-21 DIAGNOSIS — Z87892 Personal history of anaphylaxis: Secondary | ICD-10-CM | POA: Diagnosis not present

## 2021-12-21 DIAGNOSIS — Z880 Allergy status to penicillin: Secondary | ICD-10-CM | POA: Diagnosis not present

## 2022-01-11 ENCOUNTER — Ambulatory Visit (INDEPENDENT_AMBULATORY_CARE_PROVIDER_SITE_OTHER): Payer: Medicare HMO | Admitting: Thoracic Surgery (Cardiothoracic Vascular Surgery)

## 2022-01-11 ENCOUNTER — Ambulatory Visit
Admission: RE | Admit: 2022-01-11 | Discharge: 2022-01-11 | Disposition: A | Discharge status: Home / Self Care | Payer: Medicare HMO | Source: Ambulatory Visit | Attending: Thoracic Surgery (Cardiothoracic Vascular Surgery) | Admitting: Thoracic Surgery (Cardiothoracic Vascular Surgery)

## 2022-01-11 DIAGNOSIS — I700 Atherosclerosis of aorta: Secondary | ICD-10-CM | POA: Diagnosis not present

## 2022-01-11 DIAGNOSIS — Z85110 Personal history of malignant carcinoid tumor of bronchus and lung: Secondary | ICD-10-CM | POA: Diagnosis not present

## 2022-01-11 DIAGNOSIS — C3491 Malignant neoplasm of unspecified part of right bronchus or lung: Secondary | ICD-10-CM | POA: Diagnosis not present

## 2022-01-11 DIAGNOSIS — R911 Solitary pulmonary nodule: Secondary | ICD-10-CM

## 2022-01-17 NOTE — Progress Notes (Signed)
?   ?  [address] ?      [place] [address] ?            [phone]      ? ?Patient: Home ?Provider: Office ?Consent for Telemedicine visit obtained. ? ?Today?s visit was completed via a real-time telehealth (see specific modality noted below). The patient/authorized person provided oral consent at the time of the visit to engage in a telemedicine encounter with the present provider at [facility]. The patient/authorized person was informed of the potential benefits, limitations, and risks of telemedicine. The patient/authorized person expressed understanding that the laws that protect confidentiality also apply to telemedicine. The patient/authorized person acknowledged understanding that telemedicine does not provide emergency services and that he or she would need to call 911 or proceed to the nearest hospital for help if such a need arose. ? ? Total time spent in the clinical discussion 10 minutes. ? Telehealth Modality: Phone visit (audio only) ? ?I had a telephone visit with Lindsay Mason.  Overall she is doing well.  She has no complaints.  We discussed the results of her CT, and the LLL GGO appear stable.  I explained to her that since her previous cancer presented in a similar manner, we will continue to keep close follow-up on this.  She will return in 6 months with another CT chest. ? ? ?CT chest ?IMPRESSION: ?1. Status post right lower lobectomy. Persistent areas of sub solid ?nodularity in the left lower lobe appear stable compared to the ?recent prior study. When compared to more remote prior examinations ?these have slowly enlarged and remain concerning for potential ?slow-growing neoplasm such as primary bronchogenic adenocarcinoma. ?Continued attention on follow-up imaging is recommended. ?2. Aortic atherosclerosis. ?3. Mild diffuse bronchial wall thickening with mild centrilobular ?and paraseptal emphysema; imaging findings suggestive of underlying ?COPD. ?

## 2022-02-25 ENCOUNTER — Telehealth: Payer: Self-pay

## 2022-02-25 NOTE — Telephone Encounter (Signed)
This nurse attempted to call patient three times for telephonic AWV. Message left that we will call again to reschedule. ?

## 2022-03-07 ENCOUNTER — Telehealth: Payer: Self-pay | Admitting: Family Medicine

## 2022-03-07 NOTE — Telephone Encounter (Signed)
Left message for patient to call back and schedule Medicare Annual Wellness Visit (AWV) either virtually or phone   Last AWV 02/07/21 ; please schedule at anytime with health coach    I left my direct # [phone]

## 2022-04-18 ENCOUNTER — Telehealth: Payer: Self-pay

## 2022-04-18 NOTE — Telephone Encounter (Signed)
[facility] Night - Client Nonclinical Telephone Record  [facility] Client [facility] Night - Client Client Site [place] Provider Ria Bush - MD Contact Type Call Who Is Calling Patient / Member / Family / Caregiver Caller Name [place] Phone Number [phone] Call Type Message Only Information Provided Reason for Call Returning a Call from the Office Initial Lindsay Mason states he is returning a call to the office regarding his wife. Disp. Time Disposition Final User 04/17/2022 9:35:12 PM General Information Provided Yes Lindsay Mason Call Closed By: Lindsay Mason Transaction Date/Time: 04/17/2022 9:32:20 PM (ET

## 2022-04-18 NOTE — Telephone Encounter (Signed)
I don't see any phn notes or lab/imaging results indicating someone from our office called pt.  Closing encounter.

## 2022-04-20 ENCOUNTER — Other Ambulatory Visit: Payer: Self-pay | Admitting: Family Medicine

## 2022-04-20 DIAGNOSIS — M85851 Other specified disorders of bone density and structure, right thigh: Secondary | ICD-10-CM

## 2022-04-20 DIAGNOSIS — I10 Essential (primary) hypertension: Secondary | ICD-10-CM

## 2022-04-20 DIAGNOSIS — R413 Other amnesia: Secondary | ICD-10-CM

## 2022-04-20 DIAGNOSIS — E538 Deficiency of other specified B group vitamins: Secondary | ICD-10-CM

## 2022-04-24 ENCOUNTER — Other Ambulatory Visit (INDEPENDENT_AMBULATORY_CARE_PROVIDER_SITE_OTHER): Payer: Medicare HMO

## 2022-04-24 DIAGNOSIS — M85851 Other specified disorders of bone density and structure, right thigh: Secondary | ICD-10-CM | POA: Diagnosis not present

## 2022-04-24 DIAGNOSIS — I10 Essential (primary) hypertension: Secondary | ICD-10-CM

## 2022-04-24 DIAGNOSIS — E538 Deficiency of other specified B group vitamins: Secondary | ICD-10-CM

## 2022-04-24 LAB — COMPREHENSIVE METABOLIC PANEL
ALT: 9 U/L (ref 0–35)
AST: 15 U/L (ref 0–37)
Albumin: 4.2 g/dL (ref 3.5–5.2)
Alkaline Phosphatase: 97 U/L (ref 39–117)
BUN: 17 mg/dL (ref 6–23)
CO2: 31 mEq/L (ref 19–32)
Calcium: 9.3 mg/dL (ref 8.4–10.5)
Chloride: 104 mEq/L (ref 96–112)
Creatinine, Ser: 0.87 mg/dL (ref 0.40–1.20)
GFR: 67.14 mL/min (ref >60.00)
Glucose, Bld: 90 mg/dL (ref 70–99)
Potassium: 4.6 mEq/L (ref 3.5–5.1)
Sodium: 140 mEq/L (ref 135–145)
Total Bilirubin: 0.9 mg/dL (ref 0.2–1.2)
Total Protein: 6.4 g/dL (ref 6.0–8.3)

## 2022-04-24 LAB — MICROALBUMIN / CREATININE URINE RATIO
Creatinine,U: 98 mg/dL
Microalb Creat Ratio: 1.8 mg/g (ref 0.0–30.0)
Microalb, Ur: 1.8 mg/dL (ref 0.0–1.9)

## 2022-04-24 LAB — VITAMIN B12: Vitamin B-12: 375 pg/mL (ref 211–911)

## 2022-04-24 LAB — VITAMIN D 25 HYDROXY (VIT D DEFICIENCY, FRACTURES): VITD: 42.48 ng/mL (ref 30.00–100.00)

## 2022-05-01 ENCOUNTER — Ambulatory Visit (INDEPENDENT_AMBULATORY_CARE_PROVIDER_SITE_OTHER): Payer: Medicare HMO | Admitting: Family Medicine

## 2022-05-01 ENCOUNTER — Encounter: Payer: Self-pay | Admitting: Family Medicine

## 2022-05-01 VITALS — BP 130/82 | HR 90 | Temp 97.0°F | Ht 61.0 in | Wt 166.2 lb

## 2022-05-01 DIAGNOSIS — M85851 Other specified disorders of bone density and structure, right thigh: Secondary | ICD-10-CM

## 2022-05-01 DIAGNOSIS — G3184 Mild cognitive impairment, so stated: Secondary | ICD-10-CM

## 2022-05-01 DIAGNOSIS — R911 Solitary pulmonary nodule: Secondary | ICD-10-CM

## 2022-05-01 DIAGNOSIS — I10 Essential (primary) hypertension: Secondary | ICD-10-CM

## 2022-05-01 DIAGNOSIS — E538 Deficiency of other specified B group vitamins: Secondary | ICD-10-CM

## 2022-05-01 DIAGNOSIS — J439 Emphysema, unspecified: Secondary | ICD-10-CM

## 2022-05-01 DIAGNOSIS — Z7189 Other specified counseling: Secondary | ICD-10-CM

## 2022-05-01 DIAGNOSIS — Z0000 Encounter for general adult medical examination without abnormal findings: Secondary | ICD-10-CM

## 2022-05-01 DIAGNOSIS — Z902 Acquired absence of lung [part of]: Secondary | ICD-10-CM

## 2022-05-01 DIAGNOSIS — I700 Atherosclerosis of aorta: Secondary | ICD-10-CM

## 2022-05-01 MED ORDER — VITAMIN B-12 1000 MCG PO TABS: 1000.0000 ug | ORAL_TABLET | Freq: Every day | ORAL | 3 refills | Status: DC

## 2022-05-01 MED ORDER — DONEPEZIL HCL 5 MG PO TABS: 5.0000 mg | ORAL_TABLET | Freq: Every day | ORAL | 11 refills | Status: DC

## 2022-05-01 NOTE — Assessment & Plan Note (Signed)
Blood pressure stable off of medication, will remain off of it at this time.  I encouraged patient and husband to buy BP cuff to have at home and monitor intermittently and as needed.

## 2022-05-01 NOTE — Assessment & Plan Note (Signed)
Now monitoring left lower lobe nodule with every 6 month imaging, concern for second primary lung cancer.  Appreciate pulmonology and CT surgery care.

## 2022-05-01 NOTE — Patient Instructions (Addendum)
Ok to stay off blood pressure medicine as your blood pressure is good today but I'd like you to keep an eye on BP at home every few weeks or if feeling bad, let me know if running high >140/90. Buy BP cuff to have at home.  Start aricept low dose 5mg  daily, watch for stomach upset or irregular heartbeats as possible side effect. Look into MIND diet.  Call to schedule mammogram at your convenience, you may also schedule bone density scan at the same time: [facility] [phone] If interested, check with pharmacy about new 2 shot shingles series (shingrix).  Advanced directive packet provided today.  Good to see you today Return in 6 months for follow up visit memory.   Health Maintenance After Age 71 After age 7, you are at a higher risk for certain long-term diseases and infections as well as injuries from falls. Falls are a major cause of broken bones and head injuries in people who are older than age 66. Getting regular preventive care can help to keep you healthy and well. Preventive care includes getting regular testing and making lifestyle changes as recommended by your health care provider. Talk with your health care provider about: Which screenings and tests you should have. A screening is a test that checks for a disease when you have no symptoms. A diet and exercise plan that is right for you. What should I know about screenings and tests to prevent falls? Screening and testing are the best ways to find a health problem early. Early diagnosis and treatment give you the best chance of managing medical conditions that are common after age 48. Certain conditions and lifestyle choices may make you more likely to have a fall. Your health care provider may recommend: Regular vision checks. Poor vision and conditions such as cataracts can make you more likely to have a fall. If you wear glasses, make sure to get your prescription updated if your vision changes. Medicine review.  Work with your health care provider to regularly review all of the medicines you are taking, including over-the-counter medicines. Ask your health care provider about any side effects that may make you more likely to have a fall. Tell your health care provider if any medicines that you take make you feel dizzy or sleepy. Strength and balance checks. Your health care provider may recommend certain tests to check your strength and balance while standing, walking, or changing positions. Foot health exam. Foot pain and numbness, as well as not wearing proper footwear, can make you more likely to have a fall. Screenings, including: Osteoporosis screening. Osteoporosis is a condition that causes the bones to get weaker and break more easily. Blood pressure screening. Blood pressure changes and medicines to control blood pressure can make you feel dizzy. Depression screening. You may be more likely to have a fall if you have a fear of falling, feel depressed, or feel unable to do activities that you used to do. Alcohol use screening. Using too much alcohol can affect your balance and may make you more likely to have a fall. Follow these instructions at home: Lifestyle Do not drink alcohol if: Your health care provider tells you not to drink. If you drink alcohol: Limit how much you have to: 0-1 drink a day for women. 0-2 drinks a day for men. Know how much alcohol is in your drink. In the [place] one drink equals one 12 oz bottle of beer (355 mL), one 5 oz glass  of wine (148 mL), or one 1 oz glass of hard liquor (44 mL). Do not use any products that contain nicotine or tobacco. These products include cigarettes, chewing tobacco, and vaping devices, such as e-cigarettes. If you need help quitting, ask your health care provider. Activity  Follow a regular exercise program to stay fit. This will help you maintain your balance. Ask your health care provider what types of exercise are appropriate for you. If  you need a cane or walker, use it as recommended by your health care provider. Wear supportive shoes that have nonskid soles. Safety  Remove any tripping hazards, such as rugs, cords, and clutter. Install safety equipment such as grab bars in bathrooms and safety rails on stairs. Keep rooms and walkways well-lit. General instructions Talk with your health care provider about your risks for falling. Tell your health care provider if: You fall. Be sure to tell your health care provider about all falls, even ones that seem minor. You feel dizzy, tiredness (fatigue), or off-balance. Take over-the-counter and prescription medicines only as told by your health care provider. These include supplements. Eat a healthy diet and maintain a healthy weight. A healthy diet includes low-fat dairy products, low-fat (lean) meats, and fiber from whole grains, beans, and lots of fruits and vegetables. Stay current with your vaccines. Schedule regular health, dental, and eye exams. Summary Having a healthy lifestyle and getting preventive care can help to protect your health and wellness after age 60. Screening and testing are the best way to find a health problem early and help you avoid having a fall. Early diagnosis and treatment give you the best chance for managing medical conditions that are more common for people who are older than age 7. Falls are a major cause of broken bones and head injuries in people who are older than age 43. Take precautions to prevent a fall at home. Work with your health care provider to learn what changes you can make to improve your health and wellness and to prevent falls. This information is not intended to replace advice given to you by your health care provider. Make sure you discuss any questions you have with your health care provider. Document Revised: 02/19/2021 Document Reviewed: 02/19/2021 Elsevier Patient Education  [place].

## 2022-05-01 NOTE — Assessment & Plan Note (Signed)
She has at least mild cognitive impairment, as she continues to function but has significant memory deficits on testing.  They feel they are overall managing well at home. She never started Aricept but agrees to start at this time.  Reviewed possible side effects of GI upset and cardiac conduction abnormalities. Discussed importance of nutritious diet (recommended MIND diet), regular physical activity, mental exercises/reading and social engagement.  I did ask her to return in 6 months for follow-up visit

## 2022-05-01 NOTE — Assessment & Plan Note (Signed)
Stable period off of respiratory medication.

## 2022-05-01 NOTE — Progress Notes (Signed)
Patient ID: Lindsay Mason, female    DOB: 1951-09-11, 71 y.o.   MRN: [id number]  This visit was conducted in person.  BP 130/82   Pulse 90   Temp (!) 97 F (36.1 C) (Temporal)   Ht 5\' 1"  (1.549 m)   Wt 166 lb 4 oz (75.4 kg)   SpO2 98%   BMI 31.41 kg/m    CC: AMW/CPE Subjective:   HPI: Lindsay Mason is a 71 y.o. female presenting on 05/01/2022 for Medicare Wellness (Pt accompanied by husband, Elenore Rota. )   Did not see health advisor this year.   Hearing Screening   500Hz  1000Hz  2000Hz  4000Hz   Right ear 20 25 20  40  Left ear 25 25 20  40   Vision Screening   Right eye Left eye Both eyes  Without correction 20/100 20/100 20/70  With correction     Comments: Wears glasses/contacts.  Not wearing at today's OV.    [place] Visit from 05/01/2022 in [place] at [place]  PHQ-2 Total Score 0          05/01/2022   10:30 AM 02/07/2021    2:56 PM 12/23/2019    2:10 PM 12/22/2018   10:45 AM 06/11/2017    1:30 PM  Fall Risk   Falls in the past year? 0 0 1 0 No  Comment   fell down the steps    Number falls in past yr:  0 0    Injury with Fall?  0 0    Risk for fall due to :  No Fall Risks No Fall Risks    Follow up  Falls evaluation completed;Falls prevention discussed Falls evaluation completed;Falls prevention discussed     Last seen 06/2021. 10 lb weight gain noted in interim.   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 Now closely monitoring Q6 mo enlarging LLL nodule concern for second malignancy.   HTN - was on amlodipine 5mg  daily - she stopped this last year, has not restarted.   Progressive short term memory difficulties predominantly noted by family, worse since cancer diagnosis/treatment. Prevagen trial didn't help.  MMSE 06/2021: 20-22/30 (0/3 recall, 2/3 with cue), CDT 2/4.  Prior workup - B12 low s/p replacement. TSH, RPR normal. Head CT stable. Found to have markedly  low vitamin B12 levels (116) - started on 1093mcg tablets daily.  Head CT impression: No evidence of acute intracranial abnormality. 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 Electronically Signed   By: Kellie Simmering DO   On: 02/06/2021 10:06  Dx MCI. Started on donepezil 5mg  daily 06/2021 - has not been taking.   Preventative: COLONOSCOPY Date: 06/2015 microscopic colitis [name]. No blood in stool or bowel changes recently.  Well woman exam - last well woman exam with [name] NP 02/2016 - WNL. Aged out. no pelvic pain or vaginal bleeding.  Breast cancer screening - last mammogram 06/2017 [facility] @ [facility] center - overdue for f/u, # provided DEXA 06/2017 - hip -2.2, forearm -1.9. Scoliosis. Good calcium in diet. Avoids dairy but eats greek yogurt. Last vit D 72 (2012). Regularly gets weight bearing exercises - walks 1-2 hours/day.  Lung cancer screening - known lung cancer s/p excision - see above Flu shot yearly [place] 12/2019 x2, booster 09/2020.  Tetanus shot - 2005, Tdap 2017 Pneumovax 12/2011, prevnar-13 05/2017.  zostavax - 2014  shingrix - discussed - to check at pharmacy Advanced directive discussion - doesn't think has this at home. Asked to bring [place] copy. Husband [name] then daughter Leafy Ro or son Gerald Stabs would be HCPOA. Packet provided today  Seat belt use discussed Sunscreen use discussed. Would like spot on chest checked - has not seen derm.  Ex smoker - quit 07/2015!  Alcohol - rare  Dentist q6 mo  Eye exam yearly  Bowels - no constipation Bladder - no incontinence    Caffeine: 1 cup coffee/day  Lives with husband, outside dog  Activity: Very active at her pool and in her garden. Walking 1-2 hours/day.  Diet: good water, fruits/vegetables daily, greek yogurt     Relevant past medical, surgical, family and social history reviewed and updated as indicated.  Interim medical history since our last visit reviewed. Allergies and medications reviewed and updated. Outpatient Medications Prior to Visit  Medication Sig Dispense Refill   albuterol (VENTOLIN HFA) 108 (90 Base) MCG/ACT inhaler INHALE TWO PUFFS BY MOUTH INTO LUNGS EVERY SIX HOURS AS NEEDED FOR SHORTNESS OF BREATH OR wheezing 8.5 g 0   Multiple Vitamin (MULTIVITAMIN) tablet Take 1 tablet by mouth daily.     vitamin B-12 (CYANOCOBALAMIN) 1000 MCG tablet Take 1 tablet (1,000 mcg total) by mouth daily. 90 tablet 3   amLODipine (NORVASC) 5 MG tablet Take 1 tablet (5 mg total) by mouth daily. (Patient not taking: Reported on 05/01/2022) 90 tablet 3   donepezil (ARICEPT) 5 MG tablet Take 1 tablet (5 mg total) by mouth at bedtime. for memory (Patient not taking: Reported on 05/01/2022) 30 tablet 6   No facility-administered medications prior to visit.     Per HPI unless specifically indicated in ROS section below Review of Systems  Constitutional:  Negative for activity change, appetite change, chills, fatigue, fever and unexpected weight change.  HENT:  Negative for hearing loss.   Eyes:  Negative for visual disturbance.  Respiratory:  Negative for cough, chest tightness, shortness of breath and wheezing.   Cardiovascular:  Negative for chest pain, palpitations and leg swelling.  Gastrointestinal:  Negative for abdominal distention, abdominal pain, blood in stool, constipation, diarrhea, nausea and vomiting.  Genitourinary:  Negative for difficulty urinating and hematuria.  Musculoskeletal:  Negative for arthralgias, myalgias and neck pain.  Skin:  Negative for rash.  Neurological:  Negative for dizziness, seizures, syncope and headaches.  Hematological:  Negative for adenopathy. Does not bruise/bleed easily.  Psychiatric/Behavioral:  Negative for dysphoric mood. The patient is not nervous/anxious.     Objective:  BP 130/82   Pulse 90   Temp (!) 97 F (36.1 C) (Temporal)   Ht 5\' 1"  (1.549  m)   Wt 166 lb 4 oz (75.4 kg)   SpO2 98%   BMI 31.41 kg/m   Wt Readings from Last 3 Encounters:  05/01/22 166 lb 4 oz (75.4 kg)  08/27/21 156 lb 3.2 oz (70.9 kg)  07/13/21 153 lb (69.4 kg)      Physical Exam Vitals and nursing note reviewed.  Constitutional:      Appearance: Normal appearance. She is not ill-appearing.  HENT:     Head: Normocephalic and atraumatic.     Right Ear: Tympanic membrane, ear canal and external ear normal. There is no impacted cerumen.     Left Ear: Tympanic membrane, ear canal and external ear normal. There is no impacted cerumen.  Eyes:     General:  Right eye: No discharge.        Left eye: No discharge.     Extraocular Movements: Extraocular movements intact.     Conjunctiva/sclera: Conjunctivae normal.     Pupils: Pupils are equal, round, and reactive to light.  Neck:     Thyroid: No thyroid mass or thyromegaly.     Vascular: No carotid bruit.  Cardiovascular:     Rate and Rhythm: Normal rate and regular rhythm.     Pulses: Normal pulses.     Heart sounds: Normal heart sounds. No murmur heard. Pulmonary:     Effort: Pulmonary effort is normal. No respiratory distress.     Breath sounds: Normal breath sounds. No wheezing, rhonchi or rales.  Abdominal:     General: Bowel sounds are normal. There is no distension.     Palpations: Abdomen is soft. There is no mass.     Tenderness: There is no abdominal tenderness. There is no guarding or rebound.     Hernia: No hernia is present.  Musculoskeletal:     Cervical back: Normal range of motion and neck supple. No rigidity.     Right lower leg: No edema.     Left lower leg: No edema.  Lymphadenopathy:     Cervical: No cervical adenopathy.  Skin:    General: Skin is warm and dry.     Findings: No rash.  Neurological:     General: No focal deficit present.     Mental Status: She is alert. Mental status is at baseline.     Comments:  Recall 1/3 Calculation 1/5 D  Psychiatric:         Mood and Affect: Mood normal.        Behavior: Behavior normal.       Results for orders placed or performed in visit on 04/24/22  Microalbumin / creatinine urine ratio  Result Value Ref Range   Microalb, Ur 1.8 0.0 - 1.9 mg/dL   Creatinine,U 98.0 mg/dL   Microalb Creat Ratio 1.8 0.0 - 30.0 mg/g  Comprehensive metabolic panel  Result Value Ref Range   Sodium 140 135 - 145 mEq/L   Potassium 4.6 3.5 - 5.1 mEq/L   Chloride 104 96 - 112 mEq/L   CO2 31 19 - 32 mEq/L   Glucose, Bld 90 70 - 99 mg/dL   BUN 17 6 - 23 mg/dL   Creatinine, Ser 0.87 0.40 - 1.20 mg/dL   Total Bilirubin 0.9 0.2 - 1.2 mg/dL   Alkaline Phosphatase 97 39 - 117 U/L   AST 15 0 - 37 U/L   ALT 9 0 - 35 U/L   Total Protein 6.4 6.0 - 8.3 g/dL   Albumin 4.2 3.5 - 5.2 g/dL   GFR 67.14 >60.00 mL/min   Calcium 9.3 8.4 - 10.5 mg/dL  VITAMIN D 25 Hydroxy (Vit-D Deficiency, Fractures)  Result Value Ref Range   VITD 42.48 30.00 - 100.00 ng/mL  Vitamin B12  Result Value Ref Range   Vitamin B-12 375 211 - 911 pg/mL    Assessment & Plan:   Problem List Items Addressed This Visit     Health maintenance examination (Chronic)    Preventative protocols reviewed and updated unless pt declined. Discussed healthy diet and lifestyle.       Advanced care planning/counseling discussion (Chronic)    Advanced directive discussion - doesn't think has this at home. Asked to bring [place] copy. Husband [name] then daughter Leafy Ro or son Gerald Stabs would be HCPOA. Packet  provided today       Medicare annual wellness visit, subsequent - Primary (Chronic)    I have personally reviewed the Medicare Annual Wellness questionnaire and have noted 1. The patient's medical and social history 2. Their use of alcohol, tobacco or illicit drugs 3. Their current medications and supplements 4. The patient's functional ability including ADL's, fall risks, home safety risks and hearing or visual impairment. Cognitive function has been assessed and addressed as  indicated.  5. Diet and physical activity 6. Evidence for depression or mood disorders The patients weight, height, BMI have been recorded in the chart. I have made referrals, counseling and provided education to the patient based on review of the above and I have provided the pt with a written personalized care plan for preventive services. Provider list updated.. See scanned questionairre as needed for further documentation. Reviewed preventative protocols and updated unless pt declined.       Osteopenia    Reviewed DEXA scan from 2018, encouraged repeat, ordered DEXA and number provided for her or her husband to call and schedule. Discussed dietary calcium intake, continue vitamin D supplementation in her multivitamin, regular weightbearing exercise.      Relevant Orders   DG Bone Density   COPD with emphysema ([place])    Stable period off of respiratory medication.      Thoracic aortic atherosclerosis ([place])    Not on aspirin or statin.      S/P lobectomy of lung    Status post right lobectomy for primary lung adenocarcinoma 01/2020, regularly sees pulmonology and cardiothoracic surgery, now monitoring left nodule.      Hypertension    Blood pressure stable off of medication, will remain off of it at this time.  I encouraged patient and husband to buy BP cuff to have at home and monitor intermittently and as needed.      MCI (mild cognitive impairment) with memory loss    She has at least mild cognitive impairment, as she continues to function but has significant memory deficits on testing.  They feel they are overall managing well at home. She never started Aricept but agrees to start at this time.  Reviewed possible side effects of GI upset and cardiac conduction abnormalities. Discussed importance of nutritious diet (recommended MIND diet), regular physical activity, mental exercises/reading and social engagement.  I did ask her to return in 6 months for follow-up visit       Pulmonary nodule, left    Now monitoring left lower lobe nodule with every 6 month imaging, concern for second primary lung cancer.  Appreciate pulmonology and CT surgery care.      Vitamin B12 deficiency    Levels stable on oral daily vitamin B12 replacement        Meds ordered this encounter  Medications   vitamin B-12 (CYANOCOBALAMIN) 1000 MCG tablet    Sig: Take 1 tablet (1,000 mcg total) by mouth daily.    Dispense:  90 tablet    Refill:  3   donepezil (ARICEPT) 5 MG tablet    Sig: Take 1 tablet (5 mg total) by mouth at bedtime. for memory    Dispense:  30 tablet    Refill:  11   Orders Placed This Encounter  Procedures   DG Bone Density    Standing Status:   Future    Standing Expiration Date:   05/02/2023    Order Specific Question:   Reason for Exam (SYMPTOM  OR DIAGNOSIS REQUIRED)  Answer:   osteopenia    Order Specific Question:   Preferred imaging location?    Answer:   [facility]    Patient instructions: Ok to stay off blood pressure medicine as your blood pressure is good today but I'd like you to keep an eye on BP at home every few weeks or if feeling bad, let me know if running high >140/90. Buy BP cuff to have at home.  Start aricept low dose 5mg  daily, watch for stomach upset or irregular heartbeats as possible side effect. Look into MIND diet.  Call to schedule mammogram at your convenience, you may also schedule bone density scan at the same time: [facility] [phone] If interested, check with pharmacy about new 2 shot shingles series (shingrix).  Advanced directive packet provided today.  Good to see you today Return in 6 months for follow up visit memory.   Follow up plan: Return in about 6 months (around 11/01/2022) for follow up visit.  Ria Bush, MD

## 2022-05-01 NOTE — Assessment & Plan Note (Signed)
Status post right lobectomy for primary lung adenocarcinoma 01/2020, regularly sees pulmonology and cardiothoracic surgery, now monitoring left nodule.

## 2022-05-01 NOTE — Assessment & Plan Note (Addendum)
Advanced directive discussion - doesn't think has this at home. Asked to bring [place] copy. Husband [name] then daughter Leafy Ro or son Gerald Stabs would be HCPOA. Packet provided today

## 2022-05-01 NOTE — Assessment & Plan Note (Signed)
Preventative protocols reviewed and updated unless pt declined. Discussed healthy diet and lifestyle.  

## 2022-05-01 NOTE — Assessment & Plan Note (Signed)
Reviewed DEXA scan from 2018, encouraged repeat, ordered DEXA and number provided for her or her husband to call and schedule. Discussed dietary calcium intake, continue vitamin D supplementation in her multivitamin, regular weightbearing exercise.

## 2022-05-01 NOTE — Assessment & Plan Note (Signed)
Levels stable on oral daily vitamin B12 replacement

## 2022-05-01 NOTE — Assessment & Plan Note (Signed)
Not on aspirin or statin.

## 2022-05-01 NOTE — Assessment & Plan Note (Signed)

## 2022-05-08 ENCOUNTER — Ambulatory Visit (INDEPENDENT_AMBULATORY_CARE_PROVIDER_SITE_OTHER): Payer: Medicare HMO | Admitting: Podiatry

## 2022-05-08 ENCOUNTER — Encounter: Payer: Self-pay | Admitting: Podiatry

## 2022-05-08 VITALS — BP 167/97 | HR 89 | Temp 97.5°F

## 2022-05-08 DIAGNOSIS — M79674 Pain in right toe(s): Secondary | ICD-10-CM

## 2022-05-08 DIAGNOSIS — M79675 Pain in left toe(s): Secondary | ICD-10-CM | POA: Diagnosis not present

## 2022-05-08 DIAGNOSIS — B351 Tinea unguium: Secondary | ICD-10-CM | POA: Diagnosis not present

## 2022-05-13 NOTE — Progress Notes (Signed)
Subjective: Lindsay Mason presents today referred by Ria Bush, MD for complaint of with chief concern of elongated, thickened, painful, discolored toenails for months. Patient has tried self attempt at trimming toenail..   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   by CT - moderate centrilobular and paraseptal   Ex-smoker 08/2014   currently using e cig   Microscopic colitis 06/2015   lymphocytic by colonoscopy - started entocort [name]   Osteoporosis, unspecified    Scoliosis of lumbar spine    Thoracic aortic atherosclerosis ([place]) 02/2016   by CT     Patient Active Problem List   Diagnosis Date Noted   Medicare annual wellness visit, subsequent 05/01/2022   Skin lesion 02/14/2021   Pulmonary nodule, left 01/22/2021   Vitamin B12 deficiency 01/22/2021   Hypertension 01/21/2021   MCI (mild cognitive impairment) with memory loss 01/21/2021   History of primary malignant neoplasm of right lung 01/20/2020   S/P lobectomy of lung 01/20/2020   Advanced care planning/counseling discussion 06/26/2017   Degenerative scoliosis 06/26/2017   COPD with emphysema ([place]) 02/12/2016   Thoracic aortic atherosclerosis ([place]) 02/12/2016   Health maintenance examination 02/01/2016   Microscopic colitis 06/05/2015   Abnormal MRI of head 10/02/2011   Ex-smoker 04/30/2007   Osteopenia 04/30/2007     Past Surgical History:  Procedure Laterality Date   CATARACT EXTRACTION W/PHACO Right 02/21/2021   Procedure: CATARACT EXTRACTION PHACO AND INTRAOCULAR LENS PLACEMENT ([place]) RIGHT;  Surgeon: Leandrew Koyanagi, MD;  Location: [facility];  Service: Ophthalmology;  Laterality: Right;  cde 4.75 00:41.1 minutes 11.5%   CATARACT EXTRACTION W/PHACO Left 03/14/2021   Procedure: CATARACT EXTRACTION PHACO AND INTRAOCULAR  LENS PLACEMENT ([facility]) LEFT 4.07 00:44.6;  Surgeon: Leandrew Koyanagi, MD;  Location: [facility];  Service: Ophthalmology;  Laterality: Left;   COLONOSCOPY  06/2015   microscopic colitis [name]   DEXA  10/2008   Osteoporosis   DEXA  02/2014   T score osteopenia hip, osteoporosis spine, scoliosis   ELECTROMAGNETIC NAVIGATION BROCHOSCOPY  01/20/2020   Procedure: ELECTROMAGNETIC NAVIGATION BRONCHOSCOPY;  Surgeon: [name], DO;  Location: [facility];  Service: Pulmonary;;   ETT  10/2011   WNL, no evidence of ischemia, excellent exercise tolerance   EXPLORATORY LAPAROTOMY     INTERCOSTAL NERVE BLOCK Right 01/20/2020   Procedure: Intercostal Nerve Block;  Surgeon: [name], MD;  Location: [facility];  Service: Thoracic;  Laterality: Right;   MRI head  07/2011   multiple foci deep and subcortical white matter, chronic ischemic vs demyelinating, no active disease   NODE DISSECTION  01/20/2020   Procedure: Node Dissection;  Surgeon: [name], MD;  Location: [facility];  Service: Thoracic;;   SUBMUCOSAL TATTOO INJECTION  01/20/2020   Procedure: SUBSTITIAL TATTOO INJECTION;  Surgeon: [name], DO;  Location: [facility];  Service: Pulmonary;;   TONSILLECTOMY AND ADENOIDECTOMY     Vein removal     removed from legs   VIDEO ASSISTED THORACOSCOPY (VATS)/WEDGE RESECTION Right 01/20/2020   XI ROBOTIC ASSISTED THORASCOPY-RIGHT LOWER LOBE WEDGE RESECTION, RIGHT LOWER LOBECTOMYRightGeneral   VIDEO BRONCHOSCOPY  01/20/2020   Procedure: VIDEO BRONCHOSCOPY WITH FLUORO;  Surgeon: [name], DO;  Location: [facility];  Service: Pulmonary;;     Current Outpatient Medications  on File Prior to Visit  Medication Sig Dispense Refill   albuterol (VENTOLIN HFA) 108 (90 Base) MCG/ACT inhaler INHALE TWO PUFFS BY MOUTH INTO LUNGS EVERY SIX HOURS AS NEEDED FOR SHORTNESS OF BREATH OR wheezing 8.5 g 0   donepezil (ARICEPT) 5 MG tablet Take 1 tablet (5 mg total) by mouth at bedtime.  for memory 30 tablet 11   Multiple Vitamin (MULTIVITAMIN) tablet Take 1 tablet by mouth daily.     vitamin B-12 (CYANOCOBALAMIN) 1000 MCG tablet Take 1 tablet (1,000 mcg total) by mouth daily. 90 tablet 3   No current facility-administered medications on file prior to visit.     Allergies  Allergen Reactions   Penicillins Swelling    REACTION: swelling tongue - States has taken Cephalosporins without difficulty in past Did it involve swelling of the face/tongue/throat, SOB, or low BP? Yes Did it involve sudden or severe rash/hives, skin peeling, or any reaction on the inside of your mouth or nose? No Did you need to seek medical attention at a hospital or doctor's office? Yes When did it last happen? Childhood reaction If all above answers are "NO", may proceed with cephalosporin use.      Social History   Occupational History   Occupation: retired  Tobacco Use   Smoking status: Former    Packs/day: 1.00    Years: 30.00    Total pack years: 30.00    Types: Cigarettes    Start date: 10/14/1978    Quit date: 07/15/2015    Years since quitting: 6.8   Smokeless tobacco: Never   Tobacco comments:    off and on smoker, now e-cigarettes  Vaping Use   Vaping Use: Some days   Devices: uses occasionally - no nicotine  Substance and Sexual Activity   Alcohol use: Yes    Alcohol/week: 0.0 standard drinks of alcohol    Comment: social wine once a week   Drug use: No   Sexual activity: Not on file     Family History  Problem Relation Age of Onset   Osteoporosis Other        great grandmother   Stroke Maternal Grandmother    Coronary artery disease Maternal Grandfather 26   Diabetes Maternal Grandfather    Coronary artery disease Paternal Grandmother 66   Coronary artery disease Paternal Grandfather 67   Lung cancer Maternal Aunt      Immunization History  Administered Date(s) Administered   Fluad Quad(high Dose 65+) 08/07/2020   Influenza Whole 07/19/2008, 08/24/2012    Influenza, High Dose Seasonal PF 08/11/2018, 06/04/2019, 08/20/2021   Influenza, Seasonal, Injecte, Preservative Fre 07/13/2013   Influenza,inj,Quad PF,6+ Mos 08/22/2015, 06/26/2017   Influenza-Unspecified 08/16/2016   PFIZER(Purple Top)SARS-COV-2 Vaccination 12/13/2019, 01/11/2020   Pneumococcal Conjugate-13 06/11/2017   Pneumococcal Polysaccharide-23 01/01/2012, 02/26/2019   Td 04/04/2004   Tdap 02/01/2016   Zoster, Live 10/13/2013     Objective: Lindsay Mason is a pleasant 71 y.o. female WD, WN in NAD. AAO x 3.  Vitals:   05/08/22 1124  BP: (!) 167/97  Pulse: 89  Temp: (!) 97.5 F (36.4 C)    Vascular Examination:  CFT immediate b/l LE. Palpable DP/PT pulses b/l LE. Digital hair present b/l. Skin temperature gradient WNL b/l. No pain with calf compression b/l. No edema noted b/l. No cyanosis or clubbing noted b/l LE. Varicosities present b/l.  Dermatological Examination: Pedal integument with normal turgor, texture and tone b/l LE. No open wounds b/l. No interdigital macerations b/l. Toenails 1-5  b/l elongated, thickened, discolored with subungual debris. +Tenderness with dorsal palpation of nailplates. No hyperkeratotic or porokeratotic lesions present.  Musculoskeletal: Normal muscle strength 5/5 to all lower extremity muscle groups bilaterally. No pain, crepitus or joint limitation noted with ROM b/l LE. No gross bony pedal deformities b/l. Patient ambulates independently without assistive aids.  Neurological: Protective sensation intact 5/5 intact bilaterally with 10g monofilament b/l. Vibratory sensation intact b/l. Proprioception intact bilaterally.  Assessment: 1. Pain due to onychomycosis of toenails of both feet     Plan: -Patient was evaluated and treated. All patient's and/or POA's questions/concerns answered on today's visit. -Discussed topical, laser and oral medication for onychomycosis. Patient opted for toenail debridement only on today.  -Mycotic  toenails 1-5 bilaterally were debrided in length and girth with sterile nail nippers and dremel without incident. -Patient/POA to call should there be question/concern in the interim.  Return in about 3 months (around 08/08/2022).  Marzetta Board, DPM

## 2022-05-17 ENCOUNTER — Other Ambulatory Visit: Payer: Self-pay | Admitting: Thoracic Surgery (Cardiothoracic Vascular Surgery)

## 2022-05-17 DIAGNOSIS — R911 Solitary pulmonary nodule: Secondary | ICD-10-CM

## 2022-07-19 ENCOUNTER — Ambulatory Visit: Payer: Medicare HMO | Admitting: Thoracic Surgery (Cardiothoracic Vascular Surgery)

## 2022-07-19 ENCOUNTER — Ambulatory Visit: Payer: Medicare HMO

## 2022-07-19 ENCOUNTER — Ambulatory Visit
Admission: RE | Admit: 2022-07-19 | Discharge: 2022-07-19 | Disposition: A | Discharge status: Home / Self Care | Payer: Medicare HMO | Source: Ambulatory Visit | Attending: Thoracic Surgery (Cardiothoracic Vascular Surgery) | Admitting: Thoracic Surgery (Cardiothoracic Vascular Surgery)

## 2022-07-19 DIAGNOSIS — R911 Solitary pulmonary nodule: Secondary | ICD-10-CM | POA: Diagnosis not present

## 2022-07-19 DIAGNOSIS — J439 Emphysema, unspecified: Secondary | ICD-10-CM | POA: Diagnosis not present

## 2022-07-19 DIAGNOSIS — I700 Atherosclerosis of aorta: Secondary | ICD-10-CM | POA: Diagnosis not present

## 2022-07-19 DIAGNOSIS — Z85110 Personal history of malignant carcinoid tumor of bronchus and lung: Secondary | ICD-10-CM | POA: Diagnosis not present

## 2022-07-23 ENCOUNTER — Telehealth: Payer: Self-pay | Admitting: Pulmonary Disease

## 2022-07-23 NOTE — Telephone Encounter (Signed)
Hey the ct that [facility] ordered for 02/2022 somehow was closed not sure what happened but looks like patient was to have one in 02/2022 but since then they have had one in 07/2022 does the patient still need this one schedule please contact the patient and let them know if this one is not needed and if needed we will need a new order

## 2022-07-23 NOTE — Telephone Encounter (Signed)
Called the pt and there was no answer- LMTCB    

## 2022-07-23 NOTE — Telephone Encounter (Signed)
Looks like maybe Dr Elliot Gault ct's may have taken place over ours patient had one in 12/2021 even though ours was due in 02/2022

## 2022-07-26 NOTE — Telephone Encounter (Signed)
Called and spoke with patient's husband (DPR), he had me on speaker phone so the patient could hear me as well, advised that it appears that the CT scan that she had for Dr. Kipp Brood in March may have cancelled the CT that was ordered by our office for May.  I let her and her husband know that it was ok as she had the CT scan done and Dr. Valeta Harms can view the scan.  She also had a f/u CT scan done at Dr. Abran Duke office on 07/15/22 and Dr. Valeta Harms can see the results/images as well.  I let them know that she does have a PFT scheduled for 10/20 at 12 pm to arrive by 11:45 am for check in and that she will see Dr. Valeta Harms on 11/1 at 2 pm to arrive by 1:45 pm for check in. They verbalized understanding.  Nothing further needed.

## 2022-08-02 ENCOUNTER — Ambulatory Visit (INDEPENDENT_AMBULATORY_CARE_PROVIDER_SITE_OTHER): Payer: Medicare HMO | Admitting: Pulmonary Disease

## 2022-08-02 DIAGNOSIS — R911 Solitary pulmonary nodule: Secondary | ICD-10-CM

## 2022-08-02 DIAGNOSIS — R918 Other nonspecific abnormal finding of lung field: Secondary | ICD-10-CM

## 2022-08-02 LAB — PULMONARY FUNCTION TEST
FEF 25-75 Post: 1.52 L/sec
FEF 25-75 Pre: 0.8 L/sec
FEF2575-%Change-Post: 90 %
FEF2575-%Pred-Post: 90 %
FEF2575-%Pred-Pre: 47 %
FEV1-%Change-Post: 25 %
FEV1-%Pred-Post: 88 %
FEV1-%Pred-Pre: 70 %
FEV1-Post: 1.74 L
FEV1-Pre: 1.38 L
FEV1FVC-%Change-Post: 13 %
FEV1FVC-%Pred-Pre: 81 %
FEV6-%Change-Post: 10 %
FEV6-%Pred-Post: 99 %
FEV6-%Pred-Pre: 89 %
FEV6-Post: 2.46 L
FEV6-Pre: 2.23 L
FEV6FVC-%Change-Post: 0 %
FEV6FVC-%Pred-Post: 104 %
FEV6FVC-%Pred-Pre: 104 %
FVC-%Change-Post: 11 %
FVC-%Pred-Post: 95 %
FVC-%Pred-Pre: 85 %
FVC-Post: 2.48 L
FVC-Pre: 2.23 L
Post FEV1/FVC ratio: 70 %
Post FEV6/FVC ratio: 99 %
Pre FEV1/FVC ratio: 62 %
Pre FEV6/FVC Ratio: 100 %
RV % pred: 114 %
RV: 2.36 L
TLC % pred: 103 %
TLC: 4.78 L

## 2022-08-02 NOTE — Patient Instructions (Signed)
Spirometry pre/post and Pleth performed today.

## 2022-08-02 NOTE — Progress Notes (Signed)
Spirometry pre/post and pleth performed today.

## 2022-08-15 ENCOUNTER — Ambulatory Visit: Payer: Medicare HMO | Admitting: Pulmonary Disease

## 2022-08-15 VITALS — BP 120/90 | HR 86 | Ht 61.0 in | Wt 168.6 lb

## 2022-08-15 DIAGNOSIS — Z902 Acquired absence of lung [part of]: Secondary | ICD-10-CM

## 2022-08-15 DIAGNOSIS — R911 Solitary pulmonary nodule: Secondary | ICD-10-CM

## 2022-08-15 DIAGNOSIS — C3491 Malignant neoplasm of unspecified part of right bronchus or lung: Secondary | ICD-10-CM

## 2022-08-15 DIAGNOSIS — Z9889 Other specified postprocedural states: Secondary | ICD-10-CM

## 2022-08-15 DIAGNOSIS — R918 Other nonspecific abnormal finding of lung field: Secondary | ICD-10-CM | POA: Diagnosis not present

## 2022-08-15 MED ORDER — ALBUTEROL SULFATE HFA 108 (90 BASE) MCG/ACT IN AERS: 2.0000 | INHALATION_SPRAY | Freq: Four times a day (QID) | RESPIRATORY_TRACT | 6 refills | Status: AC | PRN

## 2022-08-15 NOTE — Patient Instructions (Addendum)
Thank you for visiting Dr. Valeta Harms at [facility]. Today we recommend the following:  Refills for albuterol today  Repeat CT Chest in 1 year   Return in about 1 year (around 08/16/2023) for with Eric Form, NP.    Please do your part to reduce the spread of COVID-19.

## 2022-08-15 NOTE — Progress Notes (Signed)
Synopsis: Referred in November 2022 lung nodule by Ria Bush, MD  Subjective:   PATIENT ID: Lindsay Mason GENDER: female DOB: 1951/08/18, MRN: [id number]  Chief Complaint  Patient presents with   Follow-up    This is a 71 year old female, past medical history of moderate COPD, centrilobular emphysema, microscopic colitis, community-acquired pneumonia in 2013.  In 2021 patient had a slowly changing some solid groundglass lesion within the right lower lobe and underwent a navigational bronchoscopy with dye marking and single anesthetic event for follow-up lobectomy by Dr. Kipp Brood.  This was diagnosed as a adenocarcinoma.  She has had subsequent follow-up images.  Serial imaging has showed 3 groundglass lesions within the left lower lobe.  Lesion in the left lower lobe has showed some changes and progression to a larger size in comparison to imaging dating all the way back to 2017.  We reviewed the CT images today in the office about the concern for potential second malignancy.  OV 08/15/2022: patient here for follow up with doing well today with as needed albuterol.  Follow-up CT scan completed beginning of October which showed stability of the left lower lobe groundglass nodule.  We will plan for continued surveillance imaging of this at this time.  She is agreeable to this plan.  Reviewed her CT imaging today in the office.    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   by CT - moderate centrilobular and paraseptal   Ex-smoker 08/2014   currently using e cig   Microscopic colitis 06/2015   lymphocytic by colonoscopy - started entocort [name]   Osteoporosis, unspecified    Scoliosis of lumbar spine    Thoracic aortic atherosclerosis ([place]) 02/2016   by CT     Family History  Problem Relation Age of Onset    Osteoporosis Other        great grandmother   Stroke Maternal Grandmother    Coronary artery disease Maternal Grandfather 35   Diabetes Maternal Grandfather    Coronary artery disease Paternal Grandmother 76   Coronary artery disease Paternal Grandfather 39   Lung cancer Maternal Aunt      Past Surgical History:  Procedure Laterality Date   CATARACT EXTRACTION W/PHACO Right 02/21/2021   Procedure: CATARACT EXTRACTION PHACO AND INTRAOCULAR LENS PLACEMENT ([place]) RIGHT;  Surgeon: Leandrew Koyanagi, MD;  Location: [facility];  Service: Ophthalmology;  Laterality: Right;  cde 4.75 00:41.1 minutes 11.5%   CATARACT EXTRACTION W/PHACO Left 03/14/2021   Procedure: CATARACT EXTRACTION PHACO AND INTRAOCULAR LENS PLACEMENT ([facility]) LEFT 4.07 00:44.6;  Surgeon: Leandrew Koyanagi, MD;  Location: [facility];  Service: Ophthalmology;  Laterality: Left;   COLONOSCOPY  06/2015   microscopic colitis [name]   DEXA  10/2008   Osteoporosis   DEXA  02/2014   T score osteopenia hip, osteoporosis spine, scoliosis   ELECTROMAGNETIC NAVIGATION BROCHOSCOPY  01/20/2020   Procedure: ELECTROMAGNETIC NAVIGATION BRONCHOSCOPY;  Surgeon: [name], DO;  Location: [facility];  Service: Pulmonary;;   ETT  10/2011   WNL, no evidence of ischemia, excellent exercise tolerance   EXPLORATORY LAPAROTOMY     INTERCOSTAL NERVE BLOCK Right 01/20/2020   Procedure: Intercostal Nerve Block;  Surgeon: [name], MD;  Location: [facility];  Service: Thoracic;  Laterality: Right;  MRI head  07/2011   multiple foci deep and subcortical white matter, chronic ischemic vs demyelinating, no active disease   NODE DISSECTION  01/20/2020   Procedure: Node Dissection;  Surgeon: [name], MD;  Location: [facility];  Service: Thoracic;;   SUBMUCOSAL TATTOO INJECTION  01/20/2020   Procedure: SUBSTITIAL TATTOO INJECTION;  Surgeon: [name], DO;  Location: [facility];  Service: Pulmonary;;   TONSILLECTOMY  AND ADENOIDECTOMY     Vein removal     removed from legs   VIDEO ASSISTED THORACOSCOPY (VATS)/WEDGE RESECTION Right 01/20/2020   XI ROBOTIC ASSISTED THORASCOPY-RIGHT LOWER LOBE WEDGE RESECTION, RIGHT LOWER LOBECTOMYRightGeneral   VIDEO BRONCHOSCOPY  01/20/2020   Procedure: VIDEO BRONCHOSCOPY WITH FLUORO;  Surgeon: [name], DO;  Location: [facility];  Service: Pulmonary;;    Social History   Socioeconomic History   Marital status: Married    Spouse name: Not on file   Number of children: 3   Years of education: Not on file   Highest education level: Not on file  Occupational History   Occupation: retired  Tobacco Use   Smoking status: Former    Packs/day: 1.00    Years: 30.00    Total pack years: 30.00    Types: Cigarettes    Start date: 10/14/1978    Quit date: 07/15/2015    Years since quitting: 7.0   Smokeless tobacco: Never   Tobacco comments:    off and on smoker, now e-cigarettes  Vaping Use   Vaping Use: Some days   Devices: uses occasionally - no nicotine  Substance and Sexual Activity   Alcohol use: Yes    Alcohol/week: 0.0 standard drinks of alcohol    Comment: social wine once a week   Drug use: No   Sexual activity: Not on file  Other Topics Concern   Not on file  Social History Narrative   Caffeine: 1 cup coffee/day   Lives with husband, outside dog   Smoking vapes.   Pt tans regularly - at her pool and in her garden. Wears sunscreen   Social Determinants of Health   Financial Resource Strain: Low Risk  (02/07/2021)   Overall Financial Resource Strain (CARDIA)    Difficulty of Paying Living Expenses: Not hard at all  Food Insecurity: No Food Insecurity (02/07/2021)   Hunger Vital Sign    Worried About Running Out of Food in the Last Year: Never true    Ran Out of Food in the Last Year: Never true  Transportation Needs: No Transportation Needs (02/07/2021)   PRAPARE - Hydrologist (Medical): No    Lack of  Transportation (Non-Medical): No  Physical Activity: Sufficiently Active (02/07/2021)   Exercise Vital Sign    Days of Exercise per Week: 7 days    Minutes of Exercise per Session: 60 min  Stress: No Stress Concern Present (02/07/2021)   [place]    Feeling of Stress : Not at all  Social Connections: Not on file  Intimate Partner Violence: Not At Risk (02/07/2021)   Humiliation, Afraid, Rape, and Kick questionnaire    Fear of Current or Ex-Partner: No    Emotionally Abused: No    Physically Abused: No    Sexually Abused: No     Allergies  Allergen Reactions   Penicillins Swelling    REACTION: swelling tongue - States has taken Cephalosporins without difficulty in past Did it involve swelling of  the face/tongue/throat, SOB, or low BP? Yes Did it involve sudden or severe rash/hives, skin peeling, or any reaction on the inside of your mouth or nose? No Did you need to seek medical attention at a hospital or doctor's office? Yes When did it last happen? Childhood reaction If all above answers are "NO", may proceed with cephalosporin use.      Outpatient Medications Prior to Visit  Medication Sig Dispense Refill   albuterol (VENTOLIN HFA) 108 (90 Base) MCG/ACT inhaler INHALE TWO PUFFS BY MOUTH INTO LUNGS EVERY SIX HOURS AS NEEDED FOR SHORTNESS OF BREATH OR wheezing 8.5 g 0   donepezil (ARICEPT) 5 MG tablet Take 1 tablet (5 mg total) by mouth at bedtime. for memory 30 tablet 11   Multiple Vitamin (MULTIVITAMIN) tablet Take 1 tablet by mouth daily.     vitamin B-12 (CYANOCOBALAMIN) 1000 MCG tablet Take 1 tablet (1,000 mcg total) by mouth daily. 90 tablet 3   No facility-administered medications prior to visit.    Review of Systems  Constitutional:  Negative for chills, fever, malaise/fatigue and weight loss.  HENT:  Negative for hearing loss, sore throat and tinnitus.   Eyes:  Negative for blurred vision and double  vision.  Respiratory:  Negative for cough, hemoptysis, sputum production, shortness of breath, wheezing and stridor.   Cardiovascular:  Negative for chest pain, palpitations, orthopnea, leg swelling and PND.  Gastrointestinal:  Negative for abdominal pain, constipation, diarrhea, heartburn, nausea and vomiting.  Genitourinary:  Negative for dysuria, hematuria and urgency.  Musculoskeletal:  Negative for joint pain and myalgias.  Skin:  Negative for itching and rash.  Neurological:  Negative for dizziness, tingling, weakness and headaches.  Endo/Heme/Allergies:  Negative for environmental allergies. Does not bruise/bleed easily.  Psychiatric/Behavioral:  Negative for depression. The patient is not nervous/anxious and does not have insomnia.   All other systems reviewed and are negative.    Objective:  Physical Exam Vitals reviewed.  Constitutional:      General: She is not in acute distress.    Appearance: She is well-developed.  HENT:     Head: Normocephalic and atraumatic.  Eyes:     General: No scleral icterus.    Conjunctiva/sclera: Conjunctivae normal.     Pupils: Pupils are equal, round, and reactive to light.  Neck:     Vascular: No JVD.     Trachea: No tracheal deviation.  Cardiovascular:     Rate and Rhythm: Normal rate and regular rhythm.     Heart sounds: Normal heart sounds. No murmur heard. Pulmonary:     Effort: Pulmonary effort is normal. No tachypnea, accessory muscle usage or respiratory distress.     Breath sounds: Normal breath sounds. No stridor. No wheezing, rhonchi or rales.  Abdominal:     General: Bowel sounds are normal. There is no distension.     Palpations: Abdomen is soft.     Tenderness: There is no abdominal tenderness.  Musculoskeletal:        General: No tenderness.     Cervical back: Neck supple.  Lymphadenopathy:     Cervical: No cervical adenopathy.  Skin:    General: Skin is warm and dry.     Capillary Refill: Capillary refill takes  less than 2 seconds.     Findings: No rash.  Neurological:     Mental Status: She is alert and oriented to person, place, and time.  Psychiatric:        Behavior: Behavior normal.  Vitals:   08/15/22 1408  BP: (!) 120/90  Pulse: 86  SpO2: 100%  Weight: 168 lb 9.6 oz (76.5 kg)  Height: 5\' 1"  (1.549 m)   100% on RA BMI Readings from Last 3 Encounters:  08/15/22 31.86 kg/m  05/01/22 31.41 kg/m  08/27/21 27.67 kg/m   Wt Readings from Last 3 Encounters:  08/15/22 168 lb 9.6 oz (76.5 kg)  05/01/22 166 lb 4 oz (75.4 kg)  08/27/21 156 lb 3.2 oz (70.9 kg)     CBC    Component Value Date/Time   WBC 5.2 01/19/2021 1310   RBC 4.24 01/19/2021 1310   HGB 13.3 01/19/2021 1310   HCT 40.2 01/19/2021 1310   PLT 244.0 01/19/2021 1310   MCV 94.8 01/19/2021 1310   MCH 31.0 01/22/2020 0226   MCHC 33.0 01/19/2021 1310   RDW 13.9 01/19/2021 1310   LYMPHSABS 1.5 01/19/2021 1310   MONOABS 0.3 01/19/2021 1310   EOSABS 0.1 01/19/2021 1310   BASOSABS 0.1 01/19/2021 1310    Chest Imaging: September 2022 CT chest: Left lower lobe groundglass lesions.  Slowly enlarging since 2017. The patient's images have been independently reviewed by me.   October 2023 CT chest: Stable left lower lobe groundglass lesion The patient's images have been independently reviewed by me.    Pulmonary Functions Testing Results:    Latest Ref Rng & Units 08/02/2022   11:45 AM 01/03/2020    2:44 PM  PFT Results  FVC-Pre L 2.23  P 2.62  P  FVC-Predicted Pre % 85  P 96  P  FVC-Post L 2.48  P 2.76  P  FVC-Predicted Post % 95  P 101  P  Pre FEV1/FVC % % 62  P 65  P  Post FEV1/FCV % % 70  P 69  P  FEV1-Pre L 1.38  P 1.71  P  FEV1-Predicted Pre % 70  P 83  P  FEV1-Post L 1.74  P 1.91  P  DLCO uncorrected ml/min/mmHg  17.67  P  DLCO UNC% %  100  P  DLCO corrected ml/min/mmHg  17.21  P  DLCO COR %Predicted %  97  P  DLVA Predicted %  92  P  TLC L 4.78  P 5.46  P  TLC % Predicted % 103  P 118  P   RV % Predicted % 114  P 144  P    P Preliminary result     FeNO:   Pathology:   Echocardiogram:   Heart Catheterization:     Assessment & Plan:     ICD-10-CM   1. Lung nodule  R91.1 CT Chest Wo Contrast    2. Ground glass opacity present on imaging of lung  R91.8     3. Primary lung adenocarcinoma, right ([place])  C34.91     4. S/P robotic assisted lobectomy of lung  Z90.2     5. S/P navigational bronchoscopy   Z98.890       Discussion:  This is a 71 year old female former smoker centrilobular emphysema using as needed albuterol.  Has a right lower lobe pulmonary nodule that was enlarging in nature underwent combo procedure for removal with Dr. Kipp Brood in spring 2021 diagnosed with adenocarcinoma of the right lung.  Still considered under surveillance imaging.  CT imaging in October 2023 reveals stability of the left lower lobe groundglass opacity.  Plan: Repeat noncontrasted CT chest in 1 year for follow-up of left lower lobe nodule. Continue as needed albuterol. Reviewed  pulmonary function test with shows no significant evidence of obstruction. RTC 1 year after repeat CT chest   Current Outpatient Medications:    albuterol (VENTOLIN HFA) 108 (90 Base) MCG/ACT inhaler, INHALE TWO PUFFS BY MOUTH INTO LUNGS EVERY SIX HOURS AS NEEDED FOR SHORTNESS OF BREATH OR wheezing, Disp: 8.5 g, Rfl: 0   donepezil (ARICEPT) 5 MG tablet, Take 1 tablet (5 mg total) by mouth at bedtime. for memory, Disp: 30 tablet, Rfl: 11   Multiple Vitamin (MULTIVITAMIN) tablet, Take 1 tablet by mouth daily., Disp: , Rfl:    vitamin B-12 (CYANOCOBALAMIN) 1000 MCG tablet, Take 1 tablet (1,000 mcg total) by mouth daily., Disp: 90 tablet, Rfl: 3    [name], DO [facility] 08/15/2022 2:19 PM

## 2022-08-21 ENCOUNTER — Ambulatory Visit (INDEPENDENT_AMBULATORY_CARE_PROVIDER_SITE_OTHER): Payer: Medicare HMO | Admitting: Podiatry

## 2022-08-21 DIAGNOSIS — Z91199 Patient's noncompliance with other medical treatment and regimen due to unspecified reason: Secondary | ICD-10-CM

## 2022-08-21 NOTE — Progress Notes (Signed)
1. No-show for appointment

## 2022-09-25 ENCOUNTER — Inpatient Hospital Stay: Admission: RE | Admit: 2022-09-25 | Payer: Medicare HMO | Source: Ambulatory Visit

## 2022-10-16 ENCOUNTER — Ambulatory Visit: Payer: Medicare HMO | Admitting: Podiatry

## 2022-10-16 VITALS — BP 147/99

## 2022-10-16 DIAGNOSIS — B351 Tinea unguium: Secondary | ICD-10-CM

## 2022-10-16 DIAGNOSIS — M79674 Pain in right toe(s): Secondary | ICD-10-CM

## 2022-10-16 DIAGNOSIS — M79675 Pain in left toe(s): Secondary | ICD-10-CM | POA: Diagnosis not present

## 2022-10-16 NOTE — Progress Notes (Signed)
  Subjective:  Patient ID: Lindsay Mason, female    DOB: 10-Mar-1951,  MRN: [id number]  Lindsay Mason presents to clinic today for painful elongated mycotic toenails 1-5 bilaterally which are tender when wearing enclosed shoe gear. Pain is relieved with periodic professional debridement.  Chief Complaint  Patient presents with   Nail Problem    [facility] PCP-Gutierrez PCP VST-Can not remember     New problem(s): None.   PCP is Ria Bush, MD.  Allergies  Allergen Reactions   Penicillins Swelling    REACTION: swelling tongue - States has taken Cephalosporins without difficulty in past Did it involve swelling of the face/tongue/throat, SOB, or low BP? Yes Did it involve sudden or severe rash/hives, skin peeling, or any reaction on the inside of your mouth or nose? No Did you need to seek medical attention at a hospital or doctor's office? Yes When did it last happen? Childhood reaction If all above answers are "NO", may proceed with cephalosporin use.     Review of Systems: Negative except as noted in the HPI.  Objective: No changes noted in today's physical examination. Vitals:   10/16/22 1514  BP: (!) 147/99   Lindsay Mason is a pleasant 72 y.o. female WD, WN in NAD. AAO x 3. Vascular Examination:  CFT immediate b/l LE. Palpable DP/PT pulses b/l LE. Digital hair present b/l. Skin temperature gradient WNL b/l. No pain with calf compression b/l. No edema noted b/l. No cyanosis or clubbing noted b/l LE. Varicosities present b/l.  Dermatological Examination: Pedal integument with normal turgor, texture and tone b/l LE. No open wounds b/l. No interdigital macerations b/l. Toenails 1-5 b/l elongated, thickened, discolored with subungual debris. +Tenderness with dorsal palpation of nailplates. No hyperkeratotic or porokeratotic lesions present.  Musculoskeletal: Normal muscle strength 5/5 to all lower extremity muscle groups bilaterally. No pain, crepitus or joint  limitation noted with ROM b/l LE. No gross bony pedal deformities b/l. Patient ambulates independently without assistive aids.  Neurological: Protective sensation intact 5/5 intact bilaterally with 10g monofilament b/l. Vibratory sensation intact b/l. Proprioception intact bilaterally.  Assessment/Plan: 1. Pain due to onychomycosis of toenails of both feet     No orders of the defined types were placed in this encounter.  -Examined patient. -Patient to continue soft, supportive shoe gear daily. -Toenails 1-5 b/l were debrided in length and girth with sterile nail nippers and dremel without iatrogenic bleeding.  -Patient/POA to call should there be question/concern in the interim.   Return in about 3 months (around 01/15/2023).  Marzetta Board, DPM

## 2022-10-20 ENCOUNTER — Encounter: Payer: Self-pay | Admitting: Podiatry

## 2022-10-26 DIAGNOSIS — Z85118 Personal history of other malignant neoplasm of bronchus and lung: Secondary | ICD-10-CM | POA: Diagnosis not present

## 2022-10-26 DIAGNOSIS — I10 Essential (primary) hypertension: Secondary | ICD-10-CM | POA: Diagnosis not present

## 2022-10-26 DIAGNOSIS — Z6834 Body mass index (BMI) 34.0-34.9, adult: Secondary | ICD-10-CM | POA: Diagnosis not present

## 2022-10-26 DIAGNOSIS — Z008 Encounter for other general examination: Secondary | ICD-10-CM | POA: Diagnosis not present

## 2022-10-26 DIAGNOSIS — H547 Unspecified visual loss: Secondary | ICD-10-CM | POA: Diagnosis not present

## 2022-10-26 DIAGNOSIS — I700 Atherosclerosis of aorta: Secondary | ICD-10-CM | POA: Diagnosis not present

## 2022-10-26 DIAGNOSIS — J302 Other seasonal allergic rhinitis: Secondary | ICD-10-CM | POA: Diagnosis not present

## 2022-10-26 DIAGNOSIS — I739 Peripheral vascular disease, unspecified: Secondary | ICD-10-CM | POA: Diagnosis not present

## 2022-10-26 DIAGNOSIS — G3184 Mild cognitive impairment, so stated: Secondary | ICD-10-CM | POA: Diagnosis not present

## 2022-10-26 DIAGNOSIS — J439 Emphysema, unspecified: Secondary | ICD-10-CM | POA: Diagnosis not present

## 2022-10-26 DIAGNOSIS — Z87891 Personal history of nicotine dependence: Secondary | ICD-10-CM | POA: Diagnosis not present

## 2022-10-26 DIAGNOSIS — E669 Obesity, unspecified: Secondary | ICD-10-CM | POA: Diagnosis not present

## 2022-11-01 ENCOUNTER — Ambulatory Visit (INDEPENDENT_AMBULATORY_CARE_PROVIDER_SITE_OTHER): Payer: Medicare HMO | Admitting: Family Medicine

## 2022-11-01 ENCOUNTER — Encounter: Payer: Self-pay | Admitting: Family Medicine

## 2022-11-01 VITALS — BP 136/86 | HR 81 | Temp 97.7°F | Ht 61.0 in | Wt 167.5 lb

## 2022-11-01 DIAGNOSIS — J439 Emphysema, unspecified: Secondary | ICD-10-CM | POA: Diagnosis not present

## 2022-11-01 DIAGNOSIS — R69 Illness, unspecified: Secondary | ICD-10-CM | POA: Diagnosis not present

## 2022-11-01 DIAGNOSIS — E538 Deficiency of other specified B group vitamins: Secondary | ICD-10-CM

## 2022-11-01 DIAGNOSIS — R911 Solitary pulmonary nodule: Secondary | ICD-10-CM | POA: Diagnosis not present

## 2022-11-01 DIAGNOSIS — F03A0 Unspecified dementia, mild, without behavioral disturbance, psychotic disturbance, mood disturbance, and anxiety: Secondary | ICD-10-CM | POA: Diagnosis not present

## 2022-11-01 DIAGNOSIS — Z902 Acquired absence of lung [part of]: Secondary | ICD-10-CM | POA: Diagnosis not present

## 2022-11-01 DIAGNOSIS — I10 Essential (primary) hypertension: Secondary | ICD-10-CM | POA: Diagnosis not present

## 2022-11-01 DIAGNOSIS — Z85118 Personal history of other malignant neoplasm of bronchus and lung: Secondary | ICD-10-CM | POA: Diagnosis not present

## 2022-11-01 DIAGNOSIS — Z23 Encounter for immunization: Secondary | ICD-10-CM

## 2022-11-01 MED ORDER — VITAMIN B-12 1000 MCG PO TABS: 1000.0000 ug | ORAL_TABLET | Freq: Every day | ORAL | 3 refills | Status: DC

## 2022-11-01 MED ORDER — DONEPEZIL HCL 5 MG PO TABS: 5.0000 mg | ORAL_TABLET | Freq: Every day | ORAL | 3 refills | Status: DC

## 2022-11-01 NOTE — Patient Instructions (Addendum)
Flu shot today Go ahead and check with [facility] about Aricept (donepezil) prescription - they should have this on file. I will send in 3 month supply of medicine.  [facility] # [phone]  I want yo to start taking vitamin B12 again - daily. I've sent this to [facility] pharmacy as well.  Return in 6 months for physical/wellness visit.

## 2022-11-01 NOTE — Progress Notes (Signed)
Patient ID: Lindsay Mason, female    DOB: Jan 25, 1951, 72 y.o.   MRN: [id number]  This visit was conducted in person.  BP 136/86   Pulse 81   Temp 97.7 F (36.5 C) (Temporal)   Ht 5\' 1"  (1.549 m)   Wt 167 lb 8 oz (76 kg)   SpO2 97%   BMI 31.65 kg/m    CC: 6 mo f/u visit  Subjective:   HPI: Lindsay Mason is a 72 y.o. female presenting on 11/01/2022 for Medical Management of Chronic Issues (Here for 6 mo memory f/u. Pt accompanied by husband, 11/03/2022.  )   Last seen 04/2022. See prior note for details.   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 Latest PFTs without obvious obstruction.   Progressive memory deterioration noted by family. MMSE 06/2021 - 20/30, CDT 2/4. Previous workup - B12 low s/p replacement, TSH and RPR normal, head CT stable. She's not been taking b12. Dx MCI, started donepezil 06/2021, not currently taking - states ran out 2 months ago.      Relevant past medical, surgical, family and social history reviewed and updated as indicated. Interim medical history since our last visit reviewed. Allergies and medications reviewed and updated. Outpatient Medications Prior to Visit  Medication Sig Dispense Refill   albuterol (VENTOLIN HFA) 108 (90 Base) MCG/ACT inhaler Inhale 2 puffs into the lungs every 6 (six) hours as needed for wheezing or shortness of breath. 8 g 6   albuterol (VENTOLIN HFA) 108 (90 Base) MCG/ACT inhaler INHALE TWO PUFFS BY MOUTH INTO LUNGS EVERY SIX HOURS AS NEEDED FOR SHORTNESS OF BREATH OR wheezing 8.5 g 0   Multiple Vitamin (MULTIVITAMIN) tablet Take 1 tablet by mouth daily. (Patient not taking: Reported on 11/01/2022)     donepezil (ARICEPT) 5 MG tablet Take 1 tablet (5 mg total) by mouth at bedtime. for memory (Patient not taking: Reported on 11/01/2022) 30 tablet 11   vitamin B-12  (CYANOCOBALAMIN) 1000 MCG tablet Take 1 tablet (1,000 mcg total) by mouth daily. (Patient not taking: Reported on 11/01/2022) 90 tablet 3   No facility-administered medications prior to visit.     Per HPI unless specifically indicated in ROS section below Review of Systems  Objective:  BP 136/86   Pulse 81   Temp 97.7 F (36.5 C) (Temporal)   Ht 5\' 1"  (1.549 m)   Wt 167 lb 8 oz (76 kg)   SpO2 97%   BMI 31.65 kg/m   Wt Readings from Last 3 Encounters:  11/01/22 167 lb 8 oz (76 kg)  08/15/22 168 lb 9.6 oz (76.5 kg)  05/01/22 166 lb 4 oz (75.4 kg)      Physical Exam Vitals and nursing note reviewed.  Constitutional:      Appearance: Normal appearance. She is not ill-appearing.  HENT:     Mouth/Throat:     Comments: Wearing mask Eyes:     Extraocular Movements: Extraocular movements intact.     Pupils: Pupils are equal, round, and reactive to light.  Cardiovascular:     Rate and Rhythm: Normal rate and regular rhythm.     Pulses: Normal pulses.     Heart sounds: Normal heart sounds. No murmur heard. Pulmonary:     Effort: Pulmonary effort is normal. No respiratory distress.     Breath sounds: Normal breath sounds. No wheezing,  rhonchi or rales.  Musculoskeletal:     Right lower leg: No edema.     Left lower leg: No edema.  Skin:    General: Skin is warm and dry.  Neurological:     Mental Status: She is alert.  Psychiatric:        Mood and Affect: Mood normal.        Behavior: Behavior normal.        Assessment & Plan:   Problem List Items Addressed This Visit     COPD with emphysema (HCC)    Noted on CT however PFTs without significant obstructive disease. Stable period only on prn albuterol inhaler.       History of primary malignant neoplasm of right lung    Regularly sees pulm.       S/P lobectomy of lung   Hypertension    BP stable off medication      Dementia (HCC) - Primary    Progression of memory difficulty noted however without change in  level of care need.  She's not been taking aricept. Rec restart this, 90d supply sent again to [facility]. Discussed with patient and husband importance of taking medication. She's not been taking b12 - rec restart this as well.  Husband is caregiver but has memory difficulty himself. Will ask care coordinator to reach out to patient/husband to see if they qualify for further community resources.  RTC 6 mo memory f/u visit.       Relevant Medications   donepezil (ARICEPT) 5 MG tablet   Other Relevant Orders   AMB Referral to Community Care Coordinaton ([facility] Patients)   Pulmonary nodule, left    Regularly seeing pulm - planned yearly CT scan.       Vitamin B12 deficiency    Rec restart oral b12 daily.       Other Visit Diagnoses     Need for influenza vaccination       Relevant Orders   Flu Vaccine QUAD High Dose(Fluad) (Completed)        Meds ordered this encounter  Medications   DISCONTD: cyanocobalamin (VITAMIN B12) 1000 MCG tablet    Sig: Take 1 tablet (1,000 mcg total) by mouth daily.    Dispense:  90 tablet    Refill:  3   DISCONTD: donepezil (ARICEPT) 5 MG tablet    Sig: Take 1 tablet (5 mg total) by mouth at bedtime. for memory    Dispense:  90 tablet    Refill:  3   cyanocobalamin (VITAMIN B12) 1000 MCG tablet    Sig: Take 1 tablet (1,000 mcg total) by mouth daily.    Dispense:  90 tablet    Refill:  3   donepezil (ARICEPT) 5 MG tablet    Sig: Take 1 tablet (5 mg total) by mouth at bedtime. for memory    Dispense:  90 tablet    Refill:  3    Orders Placed This Encounter  Procedures   Flu Vaccine QUAD High Dose(Fluad)   AMB Referral to Community Care Coordinaton ([facility] Patients)    Referral Priority:   Routine    Referral Type:   Consultation    Referral Reason:   Care Coordination    Number of Visits Requested:   1    Patient Instructions  Flu shot today Go ahead and check with [facility] about Aricept (donepezil) prescription - they  should have this on file. I will send in 3 month supply of medicine.  [facility] # [phone]  I want yo to start taking vitamin B12 again - daily. I've sent this to [facility] pharmacy as well.  Return in 6 months for physical/wellness visit.   Follow up plan: Return in about 6 months (around 05/02/2023) for annual exam, prior fasting for blood work, medicare wellness visit.  Eustaquio Boyden, MD

## 2022-11-02 ENCOUNTER — Encounter: Payer: Self-pay | Admitting: Family Medicine

## 2022-11-02 NOTE — Assessment & Plan Note (Signed)
Regularly seeing pulm - planned yearly CT scan.

## 2022-11-02 NOTE — Assessment & Plan Note (Addendum)
Rec restart oral b12 daily.

## 2022-11-02 NOTE — Assessment & Plan Note (Signed)
Noted on CT however PFTs without significant obstructive disease. Stable period only on prn albuterol inhaler.

## 2022-11-02 NOTE — Assessment & Plan Note (Signed)
BP stable off medication

## 2022-11-02 NOTE — Assessment & Plan Note (Signed)
Regularly sees pulm.

## 2022-11-02 NOTE — Assessment & Plan Note (Addendum)
Progression of memory difficulty noted however without change in level of care need.  She's not been taking aricept. Rec restart this, 90d supply sent again to [facility]. Discussed with patient and husband importance of taking medication. She's not been taking b12 - rec restart this as well.  Husband is caregiver but has memory difficulty himself. Will ask care coordinator to reach out to patient/husband to see if they qualify for further community resources.  RTC 6 mo memory f/u visit.

## 2022-11-04 ENCOUNTER — Telehealth: Payer: Self-pay | Admitting: *Deleted

## 2022-11-04 NOTE — Progress Notes (Signed)
  Care Coordination   Note   11/04/2022 Name: Lindsay Mason MRN: [id number] DOB: 01/23/1951  Lindsay Mason is a 73 y.o. year old female who sees Eustaquio Boyden, MD for primary care. I reached out to [name] by phone today to offer care coordination services.  Lindsay Mason was given information about Care Coordination services today including:   The Care Coordination services include support from the care team which includes your Nurse Coordinator, Clinical Social Worker, or Pharmacist.  The Care Coordination team is here to help remove barriers to the health concerns and goals most important to you. Care Coordination services are voluntary, and the patient may decline or stop services at any time by request to their care team member.   Care Coordination Consent Status: Patient agreed to services and verbal consent obtained.   Follow up plan:  Telephone appointment with care coordination team member scheduled for:  11/07/2022  Encounter Outcome:  Pt. Scheduled from referral   Burman Nieves, [facility] Care Coordination Care Guide Direct Dial: [phone]

## 2022-11-07 ENCOUNTER — Encounter: Payer: Medicare HMO | Admitting: *Deleted

## 2022-11-07 ENCOUNTER — Telehealth: Payer: Self-pay | Admitting: *Deleted

## 2022-11-07 NOTE — Progress Notes (Signed)
Spoke with LCSW who is already working with husband (caregiver) on behalf of this pt - closing referral - LCSW had an appt with husband yesterday and has f/u scheduled for February

## 2023-01-07 DIAGNOSIS — Z961 Presence of intraocular lens: Secondary | ICD-10-CM | POA: Diagnosis not present

## 2023-01-07 DIAGNOSIS — H43813 Vitreous degeneration, bilateral: Secondary | ICD-10-CM | POA: Diagnosis not present

## 2023-01-20 ENCOUNTER — Ambulatory Visit (INDEPENDENT_AMBULATORY_CARE_PROVIDER_SITE_OTHER): Payer: Medicare HMO | Admitting: Podiatry

## 2023-01-20 DIAGNOSIS — Z91199 Patient's noncompliance with other medical treatment and regimen due to unspecified reason: Secondary | ICD-10-CM

## 2023-01-20 NOTE — Progress Notes (Signed)
1. No-show for appointment   Patient with h/o dementia. No charge.

## 2023-01-24 ENCOUNTER — Telehealth: Payer: Medicare HMO | Admitting: Thoracic Surgery (Cardiothoracic Vascular Surgery)

## 2023-03-18 ENCOUNTER — Ambulatory Visit
Admission: RE | Admit: 2023-03-18 | Discharge: 2023-03-18 | Disposition: A | Discharge status: Home / Self Care | Payer: Medicare HMO | Source: Ambulatory Visit | Attending: Family Medicine | Admitting: Family Medicine

## 2023-03-18 DIAGNOSIS — M810 Age-related osteoporosis without current pathological fracture: Secondary | ICD-10-CM | POA: Diagnosis not present

## 2023-03-18 DIAGNOSIS — E349 Endocrine disorder, unspecified: Secondary | ICD-10-CM | POA: Diagnosis not present

## 2023-03-18 DIAGNOSIS — M85851 Other specified disorders of bone density and structure, right thigh: Secondary | ICD-10-CM

## 2023-03-18 DIAGNOSIS — N951 Menopausal and female climacteric states: Secondary | ICD-10-CM | POA: Diagnosis not present

## 2023-03-18 DIAGNOSIS — R29890 Loss of height: Secondary | ICD-10-CM | POA: Diagnosis not present

## 2023-03-25 ENCOUNTER — Telehealth: Payer: Self-pay

## 2023-03-25 NOTE — Telephone Encounter (Signed)
Lvm asking pt/pt's husband, Lindsay Mason (on dpr), to call back. Need to relay bone density results and Dr. Timoteo Expose message. (See Imaging, Result Notes- 03/18/23.)  Bone density/Dr. Timoteo Expose msg: Your bone density scan returned showing stable osteopenia. However, you are still at increased risk of hip fracture with a fall. I recommend regular calcium in diet and vitamin D 1000 units daily. If not getting good calcium in diet, then I recommend you start calcium + vitamin D supplement daily in addition to vitamin D3 1000 units daily.    I also recommend regular weight bearing exercise, such as walking.

## 2023-03-26 ENCOUNTER — Encounter: Payer: Self-pay | Admitting: Podiatry

## 2023-03-26 ENCOUNTER — Ambulatory Visit: Payer: Medicare HMO | Admitting: Podiatry

## 2023-03-26 DIAGNOSIS — M79674 Pain in right toe(s): Secondary | ICD-10-CM

## 2023-03-26 DIAGNOSIS — M79675 Pain in left toe(s): Secondary | ICD-10-CM | POA: Diagnosis not present

## 2023-03-26 DIAGNOSIS — B351 Tinea unguium: Secondary | ICD-10-CM

## 2023-03-26 NOTE — Progress Notes (Signed)
  Subjective:  Patient ID: Lindsay Mason, female    DOB: Jun 29, 1951,   MRN: [id number]  No chief complaint on file.   72 y.o. female presents for concern of thickened elongated and painful nails that are difficult to trim. Requesting to have them trimmed today.  PCP:  Eustaquio Boyden, MD    . Denies any other pedal complaints. Denies n/v/f/c.   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 CFT <3 seconds. Normal hair growth on digits. No edema.  Skin. No lacerations or abrasions bilateral feet. Nails 1-5 bilateral are elongated thickened and with subungual debris.  Musculoskeletal: MMT 5/5 bilateral lower extremities in DF, PF, Inversion and Eversion. Deceased ROM in DF of ankle joint.  Neurological: Sensation intact to light touch.   Assessment:   1. Pain due to onychomycosis of toenails of both feet      Plan:  Patient was evaluated and treated and all questions answered. -Mechanically debrided all nails 1-5 bilateral using sterile nail nipper and filed with dremel without incident  -Answered all patient questions -Patient to return  in 3 months for at risk foot care -Patient advised to call the office if any problems or questions arise in the meantime.   Louann Sjogren, DPM

## 2023-03-26 NOTE — Telephone Encounter (Signed)
Spoke with pt's husband, Dorinda Hill (on dpr), relaying results and Dr. Timoteo Expose message. Also, notified him I mailed all info. He verbalizes understanding and expresses his thanks.

## 2023-04-26 ENCOUNTER — Other Ambulatory Visit: Payer: Self-pay | Admitting: Family Medicine

## 2023-04-26 DIAGNOSIS — E538 Deficiency of other specified B group vitamins: Secondary | ICD-10-CM

## 2023-04-26 DIAGNOSIS — Z131 Encounter for screening for diabetes mellitus: Secondary | ICD-10-CM

## 2023-04-26 DIAGNOSIS — M85851 Other specified disorders of bone density and structure, right thigh: Secondary | ICD-10-CM

## 2023-04-28 ENCOUNTER — Other Ambulatory Visit (INDEPENDENT_AMBULATORY_CARE_PROVIDER_SITE_OTHER): Payer: Medicare HMO

## 2023-04-28 DIAGNOSIS — E538 Deficiency of other specified B group vitamins: Secondary | ICD-10-CM

## 2023-04-28 DIAGNOSIS — Z131 Encounter for screening for diabetes mellitus: Secondary | ICD-10-CM | POA: Diagnosis not present

## 2023-04-28 DIAGNOSIS — M85851 Other specified disorders of bone density and structure, right thigh: Secondary | ICD-10-CM

## 2023-04-28 LAB — VITAMIN B12: Vitamin B-12: 634 pg/mL (ref 211–911)

## 2023-04-28 LAB — VITAMIN D 25 HYDROXY (VIT D DEFICIENCY, FRACTURES): VITD: 46.76 ng/mL (ref 30.00–100.00)

## 2023-04-29 LAB — BASIC METABOLIC PANEL
BUN: 17 mg/dL (ref 6–23)
CO2: 27 mEq/L (ref 19–32)
Calcium: 10.2 mg/dL (ref 8.4–10.5)
Chloride: 105 mEq/L (ref 96–112)
Creatinine, Ser: 0.97 mg/dL (ref 0.40–1.20)
GFR: 58.51 mL/min — ABNORMAL LOW (ref >60.00)
Glucose, Bld: 104 mg/dL — ABNORMAL HIGH (ref 70–99)
Potassium: 4.4 mEq/L (ref 3.5–5.1)
Sodium: 142 mEq/L (ref 135–145)

## 2023-05-01 ENCOUNTER — Ambulatory Visit: Payer: Medicare HMO

## 2023-05-01 VITALS — Ht 61.0 in | Wt 167.0 lb

## 2023-05-01 DIAGNOSIS — Z0000 Encounter for general adult medical examination without abnormal findings: Secondary | ICD-10-CM

## 2023-05-01 NOTE — Progress Notes (Signed)
Subjective:   Lindsay Mason is a 72 y.o. female who presents for Medicare Annual (Subsequent) preventive examination.  Visit Complete: Virtual  I connected with  [name] Radi on 05/01/23 by a audio enabled telemedicine application and verified that I am speaking with the correct person using two identifiers.  Patient Location: Home  Provider Location: Office/Clinic  I discussed the limitations of evaluation and management by telemedicine. The patient expressed understanding and agreed to proceed.  Review of Systems      Cardiac Risk Factors include: advanced age (>33men, >16 women);hypertension;smoking/ tobacco exposure  Per patient no change in vitals since last visit, unable to obtain new vitals due to telehealth visit     Objective:    Today's Vitals   05/01/23 1109  Weight: 167 lb (75.8 kg)  Height: 5\' 1"  (1.549 m)   Body mass index is 31.55 kg/m.     05/01/2023   11:17 AM 02/21/2021   10:46 AM 02/07/2021    2:55 PM 01/20/2020    4:54 PM 01/20/2020    6:31 AM 01/18/2020   11:54 AM 12/23/2019    2:09 PM  Advanced Directives  Does Patient Have a Medical Advance Directive? No Yes Yes Yes Yes Yes No  Type of Special educational needs teacher of [place];Living will Healthcare Power of [place];Living will Living will Living will Living will   Does patient want to make changes to medical advance directive?  No - Patient declined  No - Patient declined  No - Patient declined   Copy of Healthcare Power of Attorney in Chart?  No - copy requested No - copy requested      Would patient like information on creating a medical advance directive? No - Patient declined      No - Patient declined    Current Medications (verified) Outpatient Encounter Medications as of 05/01/2023  Medication Sig   albuterol (VENTOLIN HFA) 108 (90 Base) MCG/ACT inhaler Inhale 2 puffs into the lungs every 6 (six) hours as needed for wheezing or shortness of breath.   cyanocobalamin (VITAMIN B12)  1000 MCG tablet Take 1 tablet (1,000 mcg total) by mouth daily.   donepezil (ARICEPT) 5 MG tablet Take 1 tablet (5 mg total) by mouth at bedtime. for memory   Multiple Vitamin (MULTIVITAMIN) tablet Take 1 tablet by mouth daily.   No facility-administered encounter medications on file as of 05/01/2023.    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   by CT   Past Surgical History:  Procedure Laterality Date   CATARACT EXTRACTION W/PHACO Right 02/21/2021   Procedure: CATARACT EXTRACTION PHACO AND INTRAOCULAR LENS PLACEMENT (IOC) RIGHT;  Surgeon: Lockie Mola, MD;  Location: [facility];  Service: Ophthalmology;  Laterality: Right;  cde 4.75 00:41.1 minutes 11.5%   CATARACT EXTRACTION W/PHACO Left 03/14/2021   Procedure: CATARACT EXTRACTION PHACO AND INTRAOCULAR LENS PLACEMENT (IOC) LEFT 4.07 00:44.6;  Surgeon: Lockie Mola, MD;  Location:  [facility];  Service: Ophthalmology;  Laterality: Left;   COLONOSCOPY  06/2015   microscopic colitis Christella Hartigan)   DEXA  10/2008   Osteoporosis   DEXA  02/2014   T score osteopenia hip, osteoporosis spine, scoliosis   ELECTROMAGNETIC NAVIGATION BROCHOSCOPY  01/20/2020   Procedure: ELECTROMAGNETIC NAVIGATION BRONCHOSCOPY;  Surgeon: Josephine Igo, DO;  Location: [facility];  Service: Pulmonary;;   ETT  10/2011   WNL, no evidence of ischemia, excellent exercise tolerance   EXPLORATORY LAPAROTOMY     INTERCOSTAL NERVE BLOCK Right  01/20/2020   Procedure: Intercostal Nerve Block;  Surgeon: Corliss Skains, MD;  Location: [facility];  Service: Thoracic;  Laterality: Right;   MRI head  07/2011   multiple foci deep and subcortical white matter, chronic ischemic vs demyelinating, no active disease   NODE DISSECTION  01/20/2020   Procedure: Node Dissection;  Surgeon: Corliss Skains, MD;  Location: [facility];  Service: Thoracic;;   SUBMUCOSAL TATTOO INJECTION  01/20/2020   Procedure: SUBSTITIAL TATTOO INJECTION;  Surgeon: Josephine Igo, DO;  Location: [facility];  Service: Pulmonary;;   TONSILLECTOMY AND ADENOIDECTOMY     Vein removal     removed from legs   VIDEO ASSISTED THORACOSCOPY (VATS)/WEDGE RESECTION Right 01/20/2020   XI ROBOTIC ASSISTED THORASCOPY-RIGHT LOWER LOBE WEDGE RESECTION, RIGHT LOWER LOBECTOMYRightGeneral   VIDEO BRONCHOSCOPY  01/20/2020   Procedure: VIDEO BRONCHOSCOPY WITH FLUORO;  Surgeon: Josephine Igo, DO;  Location: [facility];  Service: Pulmonary;;   Family History  Problem Relation Age of Onset   Osteoporosis Other        great grandmother   Stroke Maternal Grandmother    Coronary artery disease Maternal Grandfather 50   Diabetes Maternal Grandfather    Coronary artery disease Paternal Grandmother 12   Coronary artery disease Paternal Grandfather 49   Lung cancer Maternal Aunt    Social History   Socioeconomic History   Marital status: Married    Spouse name: Not on file   Number of children: 3   Years of education: Not on file   Highest education level: Not on file  Occupational History   Occupation: retired  Tobacco Use   Smoking status: Former    Current packs/day: 0.00    Average packs/day: 1 pack/day for 36.7 years (36.7 ttl pk-yrs)    Types: Cigarettes    Start date: 10/14/1978    Quit date: 07/15/2015    Years since quitting: 7.8   Smokeless tobacco: Never   Tobacco comments:    off and on smoker, now e-cigarettes  Vaping Use   Vaping status: Some Days   Devices:  uses occasionally - no nicotine  Substance and Sexual Activity   Alcohol use: Yes    Alcohol/week: 0.0 standard drinks of alcohol    Comment: social wine once a week   Drug use: No   Sexual activity: Not on file  Other Topics Concern   Not on file  Social History Narrative   Caffeine: 1 cup coffee/day   Lives with husband, outside dog   Smoking vapes.   Pt tans regularly - at her pool and in her garden. Wears sunscreen   Social Determinants of Health   Financial Resource Strain: Low Risk  (05/01/2023)   Overall Financial Resource Strain (CARDIA)    Difficulty of Paying Living Expenses: Not hard at all  Food Insecurity: No Food Insecurity (05/01/2023)   Hunger Vital Sign    Worried About Programme researcher, broadcasting/film/video in  the Last Year: Never true    Ran Out of Food in the Last Year: Never true  Transportation Needs: No Transportation Needs (05/01/2023)   PRAPARE - Administrator, Civil Service (Medical): No    Lack of Transportation (Non-Medical): No  Physical Activity: Sufficiently Active (05/01/2023)   Exercise Vital Sign    Days of Exercise per Week: 7 days    Minutes of Exercise per Session: 60 min  Stress: No Stress Concern Present (05/01/2023)   [employer] of Occupational Health - Occupational Stress Questionnaire    Feeling of Stress : Not at all  Social Connections: Socially Integrated (05/01/2023)   Social Connection and Isolation Panel [NHANES]    Frequency of Communication with Friends and Family: More than three times a week    Frequency of Social Gatherings with Friends and Family: More than three times a week    Attends Religious Services: More than 4 times per year    Active Member of [employer] or Organizations: Yes    Attends Engineer, structural: More than 4 times per year    Marital Status: Married    Tobacco Counseling Counseling given: Not Answered Tobacco comments: off and on smoker, now e-cigarettes   Clinical Intake:  Pre-visit  preparation completed: Yes  Pain : No/denies pain     BMI - recorded: 31.55 Nutritional Status: BMI > 30  Obese Nutritional Risks: None Diabetes: No  How often do you need to have someone help you when you read instructions, pamphlets, or other written materials from your doctor or pharmacy?: 1 - Never  Interpreter Needed?: No  Information entered by :: C.Ashad Fawbush LPN   Activities of Daily Living    05/01/2023   11:19 AM  In your present state of health, do you have any difficulty performing the following activities:  Hearing? 0  Vision? 0  Difficulty concentrating or making decisions? 1  Comment on medication  Walking or climbing stairs? 0  Dressing or bathing? 0  Doing errands, shopping? 0  Preparing Food and eating ? N  Using the Toilet? N  In the past six months, have you accidently leaked urine? N  Do you have problems with loss of bowel control? N  Managing your Medications? N  Managing your Finances? Y  Comment husband assists  Housekeeping or managing your Housekeeping? N    Patient Care Team: Eustaquio Boyden, MD as PCP - General (Family Medicine)  Indicate any recent Medical Services you may have received from other than [facility] providers in the past year (date may be approximate).     Assessment:   This is a routine wellness examination for Kay.  Hearing/Vision screen Hearing Screening - Comments:: Denies hearing difficulties   Vision Screening - Comments:: Glasses - UTD on eye exams - [facility]  Dietary issues and exercise activities discussed:     Goals Addressed   None    Depression Screen    05/01/2023   11:17 AM 11/01/2022   12:13 PM 05/01/2022   10:31 AM 02/07/2021    2:56 PM 01/19/2021    1:03 PM 12/23/2019    2:12 PM 12/22/2018   10:45 AM  PHQ 2/9 Scores  PHQ - 2 Score 0 0 0 0 0 0 0  PHQ- 9 Score    0 2 0 0    Fall Risk    05/01/2023   11:18 AM 11/01/2022   12:13 PM 05/01/2022   10:30 AM 02/07/2021    2:56 PM  12/23/2019    2:10  PM  Fall Risk   Falls in the past year? 0 0 0 0 1  Comment     fell down the steps  Number falls in past yr: 0   0 0  Injury with Fall? 0   0 0  Risk for fall due to : No Fall Risks   No Fall Risks No Fall Risks  Follow up Falls prevention discussed;Falls evaluation completed   Falls evaluation completed;Falls prevention discussed Falls evaluation completed;Falls prevention discussed    MEDICARE RISK AT HOME:  Medicare Risk at Home - 05/01/23 1119     Any stairs in or around the home? Yes    If so, are there any without handrails? No    Home free of loose throw rugs in walkways, pet beds, electrical cords, etc? Yes    Adequate lighting in your home to reduce risk of falls? Yes    Life alert? No    Use of a cane, walker or w/c? No    Grab bars in the bathroom? Yes    Shower chair or bench in shower? Yes    Elevated toilet seat or a handicapped toilet? No             TIMED UP AND GO:  Was the test performed?  No    Cognitive Function:    02/07/2021    3:00 PM 12/23/2019    2:15 PM 12/22/2018   10:13 AM 06/11/2017    1:35 PM  MMSE - Mini Mental State Exam  Not completed: Refused     Orientation to time  5 5 5   Orientation to Place  5 5 5   Registration  3 3 3   Attention/ Calculation  5 0 0  Recall  3 0 3  Recall-comments   unable to recall 3 of 3 words   Language- name 2 objects   0 0  Language- repeat  1 1 1   Language- follow 3 step command   3 3  Language- read & follow direction   0 0  Write a sentence   0 0  Copy design   0 0  Total score   17 20        05/01/2023   11:20 AM  6CIT Screen  What Year? 4 points  What month? 3 points  What time? 0 points  Count back from 20 4 points  Months in reverse 4 points  Repeat phrase 10 points  Total Score 25 points    Immunizations Immunization History  Administered Date(s) Administered   Fluad Quad(high Dose 65+) 08/07/2020, 11/01/2022   Influenza Whole 07/19/2008, 08/24/2012   Influenza, High Dose Seasonal  PF 08/11/2018, 06/04/2019, 08/20/2021   Influenza, Seasonal, Injecte, Preservative Fre 07/13/2013   Influenza,inj,Quad PF,6+ Mos 08/22/2015, 06/26/2017   Influenza-Unspecified 08/16/2016   PFIZER(Purple Top)SARS-COV-2 Vaccination 12/13/2019, 01/11/2020   Pneumococcal Conjugate-13 06/11/2017   Pneumococcal Polysaccharide-23 01/01/2012, 02/26/2019   Td 04/04/2004   Tdap 02/01/2016   Zoster, Live 10/13/2013    TDAP status: Up to date  Flu Vaccine status: Up to date  Pneumococcal vaccine status: Up to date  Covid-19 vaccine status: Information provided on how to obtain vaccines.   Qualifies for Shingles Vaccine? Yes   Zostavax completed Yes   Shingrix Completed?: No.    Education has been provided regarding the importance of this vaccine. Patient has been advised to call insurance company to determine out of pocket expense if they have not yet received  this vaccine. Advised may also receive vaccine at local pharmacy or Health Dept. Verbalized acceptance and understanding.  Screening Tests Health Maintenance  Topic Date Due   Zoster Vaccines- Shingrix (1 of 2) 02[birth year]1970   MAMMOGRAM  06/26/2019   COVID-19 Vaccine (3 - Pfizer risk series) 02/08/2020   INFLUENZA VACCINE  05/15/2023   Medicare Annual Wellness (AWV)  04/30/2024   Colonoscopy  06/26/2025   DTaP/Tdap/Td (3 - Td or Tdap) 01/31/2026   Pneumonia Vaccine 40+ Years old  Completed   DEXA SCAN  Completed   Hepatitis C Screening  Completed   HPV VACCINES  Aged Out    Health Maintenance  Health Maintenance Due  Topic Date Due   Zoster Vaccines- Shingrix (1 of 2) 02[birth year]15[birth year]1970   MAMMOGRAM  06/26/2019   COVID-19 Vaccine (3 - Pfizer risk series) 02/08/2020    Colorectal cancer screening: Type of screening: Colonoscopy. Completed 06/27/15. Repeat every 10 years  Mammogram status: Completed 06/25/17. Repeat every year Declined  Bone Density status: Completed 03/18/23. Results reflect: Bone density results: OSTEOPENIA. Repeat  every 2 years.  Lung Cancer Screening: (Low Dose CT Chest recommended if Age 39-80 years, 20 pack-year currently smoking OR have quit w/in 15years.) does qualify.   Lung Cancer Screening Referral:  no, CT Chest last done 08/08/23  Additional Screening:  Hepatitis C Screening: does qualify; Completed 01/25/16  Vision Screening: Recommended annual ophthalmology exams for early detection of glaucoma and other disorders of the eye. Is the patient up to date with their annual eye exam?  Yes  Who is the provider or what is the name of the office in which the patient attends annual eye exams? [facility] If pt is not established with a provider, would they like to be referred to a provider to establish care? No .   Dental Screening: Recommended annual dental exams for proper oral hygiene    Community Resource Referral / Chronic Care Management: CRR required this visit?  No   CCM required this visit?  No     Plan:     I have personally reviewed and noted the following in the patient's chart:   Medical and social history Use of alcohol, tobacco or illicit drugs  Current medications and supplements including opioid prescriptions. Patient is not currently taking opioid prescriptions. Functional ability and status Nutritional status Physical activity Advanced directives List of other physicians Hospitalizations, surgeries, and ER visits in previous 12 months Vitals Screenings to include cognitive, depression, and falls Referrals and appointments  In addition, I have reviewed and discussed with patient certain preventive protocols, quality metrics, and best practice recommendations. A written personalized care plan for preventive services as well as general preventive health recommendations were provided to patient.     Maryan Puls, LPN   [id number]/[id number]   After Visit Summary: (MyChart) Due to this being a telephonic visit, the after visit summary with patients personalized  plan was offered to patient via MyChart   Nurse Notes: [id number]

## 2023-05-01 NOTE — Patient Instructions (Addendum)
[name] , Thank you for taking time to come for your Medicare Wellness Visit. I appreciate your ongoing commitment to your health goals. Please review the following plan we discussed and let me know if I can assist you in the future.   These are the goals we discussed:  Goals      Increase physical activity     Starting 12/22/2018, I will continue to walk at least 35 minutes daily.      Patient Stated     12/23/2019, I will continue to walk everyday for 1 hour.      Patient Stated     02/07/2021, I will continue to walk everyday for 1 hour.         This is a list of the screening recommended for you and due dates:  Health Maintenance  Topic Date Due   Zoster (Shingles) Vaccine (1 of 2) 02/15/1970   Mammogram  06/26/2019   COVID-19 Vaccine (3 - Pfizer risk series) 02/08/2020   Medicare Annual Wellness Visit  05/02/2023   Flu Shot  05/15/2023   Colon Cancer Screening  06/26/2025   DTaP/Tdap/Td vaccine (3 - Td or Tdap) 01/31/2026   Pneumonia Vaccine  Completed   DEXA scan (bone density measurement)  Completed   Hepatitis C Screening  Completed   HPV Vaccine  Aged Out    Advanced directives: Please bring a copy of your health care power of attorney and living will to the office to be added to your chart at your convenience.   Conditions/risks identified: none  Next appointment: Follow up in one year for your annual wellness visit 05/04/24 @ 11:15 telephone   Preventive Care 65 Years and Older, Female Preventive care refers to lifestyle choices and visits with your health care provider that can promote health and wellness. What does preventive care include? A yearly physical exam. This is also called an annual well check. Dental exams once or twice a year. Routine eye exams. Ask your health care provider how often you should have your eyes checked. Personal lifestyle choices, including: Daily care of your teeth and gums. Regular physical activity. Eating a healthy  diet. Avoiding tobacco and drug use. Limiting alcohol use. Practicing safe sex. Taking low-dose aspirin every day. Taking vitamin and mineral supplements as recommended by your health care provider. What happens during an annual well check? The services and screenings done by your health care provider during your annual well check will depend on your age, overall health, lifestyle risk factors, and family history of disease. Counseling  Your health care provider may ask you questions about your: Alcohol use. Tobacco use. Drug use. Emotional well-being. Home and relationship well-being. Sexual activity. Eating habits. History of falls. Memory and ability to understand (cognition). Work and work Astronomer. Reproductive health. Screening  You may have the following tests or measurements: Height, weight, and BMI. Blood pressure. Lipid and cholesterol levels. These may be checked every 5 years, or more frequently if you are over 66 years old. Skin check. Lung cancer screening. You may have this screening every year starting at age 51 if you have a 30-pack-year history of smoking and currently smoke or have quit within the past 15 years. Fecal occult blood test (FOBT) of the stool. You may have this test every year starting at age 65. Flexible sigmoidoscopy or colonoscopy. You may have a sigmoidoscopy every 5 years or a colonoscopy every 10 years starting at age 58. Hepatitis C blood test. Hepatitis B blood test.  Sexually transmitted disease (STD) testing. Diabetes screening. This is done by checking your blood sugar (glucose) after you have not eaten for a while (fasting). You may have this done every 1-3 years. Bone density scan. This is done to screen for osteoporosis. You may have this done starting at age 30. Mammogram. This may be done every 1-2 years. Talk to your health care provider about how often you should have regular mammograms. Talk with your health care provider about  your test results, treatment options, and if necessary, the need for more tests. Vaccines  Your health care provider may recommend certain vaccines, such as: Influenza vaccine. This is recommended every year. Tetanus, diphtheria, and acellular pertussis (Tdap, Td) vaccine. You may need a Td booster every 10 years. Zoster vaccine. You may need this after age 42. Pneumococcal 13-valent conjugate (PCV13) vaccine. One dose is recommended after age 17. Pneumococcal polysaccharide (PPSV23) vaccine. One dose is recommended after age 65. Talk to your health care provider about which screenings and vaccines you need and how often you need them. This information is not intended to replace advice given to you by your health care provider. Make sure you discuss any questions you have with your health care provider. Document Released: 10/27/2015 Document Revised: 06/19/2016 Document Reviewed: 08/01/2015 Elsevier Interactive Patient Education  2017 [employer].  Fall Prevention in the Home Falls can cause injuries. They can happen to people of all ages. There are many things you can do to make your home safe and to help prevent falls. What can I do on the outside of my home? Regularly fix the edges of walkways and driveways and fix any cracks. Remove anything that might make you trip as you walk through a door, such as a raised step or threshold. Trim any bushes or trees on the path to your home. Use bright outdoor lighting. Clear any walking paths of anything that might make someone trip, such as rocks or tools. Regularly check to see if handrails are loose or broken. Make sure that both sides of any steps have handrails. Any raised decks and porches should have guardrails on the edges. Have any leaves, snow, or ice cleared regularly. Use sand or salt on walking paths during winter. Clean up any spills in your garage right away. This includes oil or grease spills. What can I do in the bathroom? Use  night lights. Install grab bars by the toilet and in the tub and shower. Do not use towel bars as grab bars. Use non-skid mats or decals in the tub or shower. If you need to sit down in the shower, use a plastic, non-slip stool. Keep the floor dry. Clean up any water that spills on the floor as soon as it happens. Remove soap buildup in the tub or shower regularly. Attach bath mats securely with double-sided non-slip rug tape. Do not have throw rugs and other things on the floor that can make you trip. What can I do in the bedroom? Use night lights. Make sure that you have a light by your bed that is easy to reach. Do not use any sheets or blankets that are too big for your bed. They should not hang down onto the floor. Have a firm chair that has side arms. You can use this for support while you get dressed. Do not have throw rugs and other things on the floor that can make you trip. What can I do in the kitchen? Clean up any spills right away.  Avoid walking on wet floors. Keep items that you use a lot in easy-to-reach places. If you need to reach something above you, use a strong step stool that has a grab bar. Keep electrical cords out of the way. Do not use floor polish or wax that makes floors slippery. If you must use wax, use non-skid floor wax. Do not have throw rugs and other things on the floor that can make you trip. What can I do with my stairs? Do not leave any items on the stairs. Make sure that there are handrails on both sides of the stairs and use them. Fix handrails that are broken or loose. Make sure that handrails are as long as the stairways. Check any carpeting to make sure that it is firmly attached to the stairs. Fix any carpet that is loose or worn. Avoid having throw rugs at the top or bottom of the stairs. If you do have throw rugs, attach them to the floor with carpet tape. Make sure that you have a light switch at the top of the stairs and the bottom of the  stairs. If you do not have them, ask someone to add them for you. What else can I do to help prevent falls? Wear shoes that: Do not have high heels. Have rubber bottoms. Are comfortable and fit you well. Are closed at the toe. Do not wear sandals. If you use a stepladder: Make sure that it is fully opened. Do not climb a closed stepladder. Make sure that both sides of the stepladder are locked into place. Ask someone to hold it for you, if possible. Clearly mark and make sure that you can see: Any grab bars or handrails. First and last steps. Where the edge of each step is. Use tools that help you move around (mobility aids) if they are needed. These include: Canes. Walkers. Scooters. Crutches. Turn on the lights when you go into a dark area. Replace any light bulbs as soon as they burn out. Set up your furniture so you have a clear path. Avoid moving your furniture around. If any of your floors are uneven, fix them. If there are any pets around you, be aware of where they are. Review your medicines with your doctor. Some medicines can make you feel dizzy. This can increase your chance of falling. Ask your doctor what other things that you can do to help prevent falls. This information is not intended to replace advice given to you by your health care provider. Make sure you discuss any questions you have with your health care provider. Document Released: 07/27/2009 Document Revised: 03/07/2016 Document Reviewed: 11/04/2014 Elsevier Interactive Patient Education  2017 [employer].

## 2023-05-05 ENCOUNTER — Encounter: Payer: Self-pay | Admitting: Family Medicine

## 2023-05-05 ENCOUNTER — Ambulatory Visit (INDEPENDENT_AMBULATORY_CARE_PROVIDER_SITE_OTHER): Payer: Medicare HMO | Admitting: Family Medicine

## 2023-05-05 VITALS — BP 132/82 | HR 78 | Temp 97.5°F | Ht 60.0 in | Wt 164.4 lb

## 2023-05-05 DIAGNOSIS — F03B0 Unspecified dementia, moderate, without behavioral disturbance, psychotic disturbance, mood disturbance, and anxiety: Secondary | ICD-10-CM | POA: Diagnosis not present

## 2023-05-05 DIAGNOSIS — Z87891 Personal history of nicotine dependence: Secondary | ICD-10-CM

## 2023-05-05 DIAGNOSIS — Z0000 Encounter for general adult medical examination without abnormal findings: Secondary | ICD-10-CM

## 2023-05-05 DIAGNOSIS — I700 Atherosclerosis of aorta: Secondary | ICD-10-CM | POA: Diagnosis not present

## 2023-05-05 DIAGNOSIS — Z85118 Personal history of other malignant neoplasm of bronchus and lung: Secondary | ICD-10-CM | POA: Diagnosis not present

## 2023-05-05 DIAGNOSIS — Z902 Acquired absence of lung [part of]: Secondary | ICD-10-CM

## 2023-05-05 DIAGNOSIS — Z7189 Other specified counseling: Secondary | ICD-10-CM | POA: Diagnosis not present

## 2023-05-05 DIAGNOSIS — I10 Essential (primary) hypertension: Secondary | ICD-10-CM | POA: Diagnosis not present

## 2023-05-05 DIAGNOSIS — M85851 Other specified disorders of bone density and structure, right thigh: Secondary | ICD-10-CM | POA: Diagnosis not present

## 2023-05-05 DIAGNOSIS — J439 Emphysema, unspecified: Secondary | ICD-10-CM | POA: Diagnosis not present

## 2023-05-05 DIAGNOSIS — E538 Deficiency of other specified B group vitamins: Secondary | ICD-10-CM

## 2023-05-05 MED ORDER — DONEPEZIL HCL 10 MG PO TABS: 10.0000 mg | ORAL_TABLET | Freq: Every day | ORAL | 4 refills | Status: DC

## 2023-05-05 NOTE — Progress Notes (Unsigned)
Ph: [phone] Fax: [phone]   Patient ID: Lindsay Mason, female    DOB: 09-02-1951, 72 y.o.   MRN: [id number]  This visit was conducted in person.  BP 132/82   Pulse 78   Temp (!) 97.5 F (36.4 C) (Temporal)   Ht 5' (1.524 m)   Wt 164 lb 6 oz (74.6 kg)   SpO2 99%   BMI 32.10 kg/m    CC: CPE Subjective:   HPI: Lindsay Mason is a 72 y.o. female presenting on 05/05/2023 for Annual Exam (MCR prt 2 [AWV- 05/01/23]. Pt accompanied by husband, Dorinda Hill. )   Saw health advisor last week for medicare wellness visit. Note reviewed. Failed cognitive assessment - known dementia.   No results found.  Flowsheet Row Clinical Support from 05/01/2023 in [facility]  PHQ-2 Total Score 0          05/01/2023   11:18 AM 11/01/2022   12:13 PM 05/01/2022   10:30 AM 02/07/2021    2:56 PM 12/23/2019    2:10 PM  Fall Risk   Falls in the past year? 0 0 0 0 1  Comment     fell down the steps  Number falls in past yr: 0   0 0  Injury with Fall? 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 Now closely monitoring Q6 mo enlarging LLL nodule concern for second malignancy.    HTN - was on amlodipine 5mg  daily - she stopped this last year, has not restarted.    Progressive memory deterioration noted by family. MMSE 06/2021 - 20/30, CDT 2/4. Previous workup - B12 low s/p replacement, TSH and RPR normal, head CT stable. She's not been taking b12. Dx MCI, taking donepezil as well as B12.   Geriatric Assessment: Activities of Daily Living:     Bathing- independent     Dressing- independent     Eating- independent     Toileting- independent      Transferring-  independent     Continence- independent  Overall Assessment: independent   Instrumental Activities of Daily Living:     Transportation- dependent     Meal/Food Preparation- dependent     Shopping Errands- dependent     Housekeeping/Chores- dependent     Money Management/Finances- dependent     Medication Management- dependent     Ability to Use Telephone- dependent     Laundry- dependent  Overall Assessment:  dependent    Preventative: COLONOSCOPY Date: 06/2015 microscopic colitis Lindsay Mason). No blood in stool or bowel changes recently. Defer due to memory  Well woman exam - last well woman exam with Mayra Reel NP 02/2016 - WNL. Aged out. no pelvic pain or vaginal bleeding.  Breast cancer screening - last mammogram 06/2017 [facility] center - overdue for f/u, # provided DEXA 06/2017 - hip -2.2, forearm -1.9. Scoliosis.  DEXA 03/2023 - T -2.3 RFN, increased hip fracture risk 3.4%  Good calcium in diet. Avoids dairy but eats greek yogurt. Last vit D 72 (2012). Regularly gets weight bearing exercises - walks 1-2 hours/day.  Lung cancer screening - known lung cancer s/p excision - see above  Flu shot yearly COVID vaccine Pfizer 12/2019 x2, booster 09/2020.  Tetanus shot - 2005, Tdap 2017 Pneumovax 12/2011, prevnar-13 05/2017.  zostavax - 2014  shingrix - discussed - to check at pharmacy Advanced directive discussion - doesn't think has this at home. Asked to bring [place] copy. Husband Roe Coombs then daughter Angelica Chessman or son Thayer Ohm would be HCPOA. Packet provided today  Seat belt use discussed Sunscreen use discussed. Would like spot on chest checked - has not seen derm.  Ex smoker - quit 07/2015!  Alcohol - rare  Dentist q6 mo  Eye exam yearly  Bowels - no constipation Bladder - no incontinence    Caffeine: 1 cup coffee/day  Lives with husband, outside dog  Activity: Very active at her pool and in her garden. Walking 1-2 hours/day.  Diet: good water, fruits/vegetables daily, greek  yogurt     Relevant past medical, surgical, family and social history reviewed and updated as indicated. Interim medical history since our last visit reviewed. Allergies and medications reviewed and updated. Outpatient Medications Prior to Visit  Medication Sig Dispense Refill   albuterol (VENTOLIN HFA) 108 (90 Base) MCG/ACT inhaler Inhale 2 puffs into the lungs every 6 (six) hours as needed for wheezing or shortness of breath. 8 g 6   cyanocobalamin (VITAMIN B12) 1000 MCG tablet Take 1 tablet (1,000 mcg total) by mouth daily. 90 tablet 3   Multiple Vitamin (MULTIVITAMIN) tablet Take 1 tablet by mouth daily.     donepezil (ARICEPT) 5 MG tablet Take 1 tablet (5 mg total) by mouth at bedtime. for memory 90 tablet 3   No facility-administered medications prior to visit.     Per HPI unless specifically indicated in ROS section below Review of Systems  Constitutional:  Negative for activity change, appetite change, chills, fatigue, fever and unexpected weight change.  HENT:  Negative for hearing loss.   Eyes:  Negative for visual disturbance.  Respiratory:  Negative for cough, chest tightness, shortness of breath and wheezing.   Cardiovascular:  Negative for chest pain, palpitations and leg swelling.  Gastrointestinal:  Negative for abdominal distention, abdominal pain, blood in stool, constipation, diarrhea, nausea and vomiting.  Genitourinary:  Negative for difficulty urinating and hematuria.  Musculoskeletal:  Negative for arthralgias, myalgias and neck pain.  Skin:  Negative for rash.  Neurological:  Negative for dizziness, seizures, syncope and headaches.  Hematological:  Negative for adenopathy. Does not bruise/bleed easily.  Psychiatric/Behavioral:  Negative for dysphoric mood. The patient is not nervous/anxious.     Objective:  BP 132/82   Pulse 78   Temp (!) 97.5 F (36.4 C) (Temporal)   Ht 5' (1.524 m)   Wt 164 lb 6 oz (74.6 kg)   SpO2 99%   BMI 32.10 kg/m   Wt Readings  from Last 3 Encounters:  05/05/23 164 lb 6 oz (74.6 kg)  05/01/23 167 lb (75.8 kg)  11/01/22 167 lb 8 oz (76 kg)      Physical Exam Vitals and nursing note reviewed.  Constitutional:      Appearance: Normal appearance. She is not ill-appearing.  HENT:     Head: Normocephalic and atraumatic.     Right Ear: Tympanic membrane, ear canal and external ear normal. There is no impacted cerumen.     Left Ear: Tympanic membrane, ear canal and external ear normal. There is no impacted cerumen.     Nose: Nose normal.     Mouth/Throat:     Mouth: Mucous membranes are moist.  Pharynx: Oropharynx is clear. No oropharyngeal exudate or posterior oropharyngeal erythema.  Eyes:     General:        Right eye: No discharge.        Left eye: No discharge.     Extraocular Movements: Extraocular movements intact.     Conjunctiva/sclera: Conjunctivae normal.     Pupils: Pupils are equal, round, and reactive to light.  Neck:     Thyroid: No thyroid mass or thyromegaly.     Vascular: No carotid bruit.  Cardiovascular:     Rate and Rhythm: Normal rate and regular rhythm.     Pulses: Normal pulses.     Heart sounds: Normal heart sounds. No murmur heard. Pulmonary:     Effort: Pulmonary effort is normal. No respiratory distress.     Breath sounds: Normal breath sounds. No wheezing, rhonchi or rales.  Abdominal:     General: Bowel sounds are normal. There is no distension.     Palpations: Abdomen is soft. There is no mass.     Tenderness: There is no abdominal tenderness. There is no guarding or rebound.     Hernia: No hernia is present.  Musculoskeletal:     Cervical back: Normal range of motion and neck supple. No rigidity.     Right lower leg: No edema.     Left lower leg: No edema.  Lymphadenopathy:     Cervical: No cervical adenopathy.  Skin:    General: Skin is warm and dry.     Findings: No rash.  Neurological:     General: No focal deficit present.     Mental Status: She is alert.  Mental status is at baseline.  Psychiatric:        Mood and Affect: Mood normal.        Behavior: Behavior normal.       Results for orders placed or performed in visit on 04/28/23  VITAMIN D 25 Hydroxy (Vit-D Deficiency, Fractures)  Result Value Ref Range   VITD 46.76 30.00 - 100.00 ng/mL  Vitamin B12  Result Value Ref Range   Vitamin B-12 634 211 - 911 pg/mL  Basic metabolic panel  Result Value Ref Range   Sodium 142 135 - 145 mEq/L   Potassium 4.4 3.5 - 5.1 mEq/L   Chloride 105 96 - 112 mEq/L   CO2 27 19 - 32 mEq/L   Glucose, Bld 104 (H) 70 - 99 mg/dL   BUN 17 6 - 23 mg/dL   Creatinine, Ser 9.60 0.40 - 1.20 mg/dL   GFR 45.40 (L) >98.11 mL/min   Calcium 10.2 8.4 - 10.5 mg/dL    Assessment & Plan:   Problem List Items Addressed This Visit   None    Meds ordered this encounter  Medications   donepezil (ARICEPT) 10 MG tablet    Sig: Take 1 tablet (10 mg total) by mouth at bedtime. for memory    Dispense:  90 tablet    Refill:  4    No orders of the defined types were placed in this encounter.   Patient Instructions  Your recent bone density scan showed osteopenia but with increased fracture risk if falls. Ensure taking daily vitamin, good calcium intake in the diet. Get plenty of milk, cheese, leafy green vegetables to keep bones strong. Try to get regular walking as well.  Call to schedule mammogram at your convenience: [facility] [phone] Check with pharmacy about new shingles shot and RSV shot.  Schedule appointment with Dr Tonia Brooms for November of this year.  Increase donepezil (Aricept) to 10mg  daily. New dose at pharmacy Return to see me in 6 months for follow up visit  Follow up plan: No follow-ups on file.  Eustaquio Boyden, MD

## 2023-05-05 NOTE — Patient Instructions (Addendum)
Your recent bone density scan showed osteopenia but with increased fracture risk if falls. Ensure taking daily vitamin, good calcium intake in the diet. Get plenty of milk, cheese, leafy green vegetables to keep bones strong. Try to get regular walking as well.  Call to schedule mammogram at your convenience: [facility] [phone] Check with pharmacy about new shingles shot and RSV shot.  Schedule appointment with Dr Tonia Brooms for November of this year.  Increase donepezil (Aricept) to 10mg  daily. New dose at pharmacy Return to see me in 6 months for follow up visit

## 2023-05-06 NOTE — Assessment & Plan Note (Signed)
She regularly sees pulm

## 2023-05-06 NOTE — Assessment & Plan Note (Signed)
Remains abstinent - congratulated 

## 2023-05-06 NOTE — Assessment & Plan Note (Signed)
Chronic, stable off medication.  

## 2023-05-06 NOTE — Assessment & Plan Note (Signed)
Continues b12 orally daily.

## 2023-05-06 NOTE — Assessment & Plan Note (Addendum)
Preventative protocols reviewed and updated unless pt declined. Discussed healthy diet and lifestyle.  # provided to call and schedule mammogram.  

## 2023-05-06 NOTE — Assessment & Plan Note (Addendum)
Continued progression noted by husband - not driving, husband helps manage most IADLs. Tolerating donepezil 5mg  nightly well, will increase to 10mg  nightly. No GI upset on this dose. Briefly discussed possible addition of namenda - will start by maximizing donepezil first. RTC 6 mo f/u visit.

## 2023-05-06 NOTE — Assessment & Plan Note (Addendum)
Advanced directive discussion - doesn't think has this at home. Asked to bring [place] copy when complete. Husband Roe Coombs then daughter Angelica Chessman or son Thayer Ohm would be HCPOA. Packet previously provided.

## 2023-05-06 NOTE — Assessment & Plan Note (Signed)
Reviewed latest DEXA from 03/2023 showing osteopenia but with increased hip fracture risk 3.4% - encouraged good calcium in diet, continue vit D in MVI, regular walking. Will avoid treatment at this time, consider repeat DEXA in 2-3 yrs.

## 2023-05-06 NOTE — Assessment & Plan Note (Addendum)
Stable period off respiratory medication besides PRN albuterol.

## 2023-05-06 NOTE — Assessment & Plan Note (Addendum)
Not on statin given age and memory difficulties

## 2023-06-24 ENCOUNTER — Other Ambulatory Visit: Payer: Self-pay | Admitting: Family Medicine

## 2023-06-24 DIAGNOSIS — F03B0 Unspecified dementia, moderate, without behavioral disturbance, psychotic disturbance, mood disturbance, and anxiety: Secondary | ICD-10-CM

## 2023-06-24 DIAGNOSIS — Z902 Acquired absence of lung [part of]: Secondary | ICD-10-CM

## 2023-06-24 DIAGNOSIS — Z85118 Personal history of other malignant neoplasm of bronchus and lung: Secondary | ICD-10-CM

## 2023-06-24 NOTE — Progress Notes (Signed)
Spoke with husband at his appt.  Progressively worsening dementia in Lindsay Mason, now with urinary incontinence episodes.  Discussed both he and wife moving to ALF - as they both have memory difficulty, hers worse than his.  He agrees to palliative care eval in the house for level of care assessment.

## 2023-06-30 ENCOUNTER — Ambulatory Visit: Payer: Medicare HMO | Admitting: Podiatry

## 2023-06-30 ENCOUNTER — Encounter: Payer: Self-pay | Admitting: Podiatry

## 2023-06-30 DIAGNOSIS — M79674 Pain in right toe(s): Secondary | ICD-10-CM

## 2023-06-30 DIAGNOSIS — B351 Tinea unguium: Secondary | ICD-10-CM | POA: Diagnosis not present

## 2023-06-30 DIAGNOSIS — M79675 Pain in left toe(s): Secondary | ICD-10-CM

## 2023-06-30 NOTE — Progress Notes (Signed)
Subjective:  Patient ID: Lindsay Mason, female    DOB: [birth year],   MRN: [id number]  Chief Complaint  Patient presents with   Nail Problem    Pt presents for a dfc.    72 y.o. female presents for concern of thickened elongated and painful nails that are difficult to trim. Requesting to have them trimmed today.  PCP:  Eustaquio Boyden, MD    . Denies any other pedal complaints. Denies n/v/f/c.   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 CFT <3 seconds. Normal hair growth on digits. No edema.  Skin. No lacerations or abrasions bilateral feet. Nails 1-5 bilateral are elongated thickened and with subungual debris.  Musculoskeletal: MMT 5/5 bilateral lower extremities in DF, PF, Inversion and Eversion. Deceased ROM in DF of ankle joint.  Neurological: Sensation intact to light touch.   Assessment:   1. Pain due to onychomycosis of toenails of both feet      Plan:  Patient was evaluated and treated and all questions answered. -Mechanically debrided all nails 1-5 bilateral using sterile nail nipper and filed with dremel without incident  -Answered all patient questions -Patient to return  in 3 months for at risk foot care -Patient advised to call the office if any problems or questions arise in the meantime.   Louann Sjogren, DPM

## 2023-07-25 ENCOUNTER — Telehealth: Payer: Self-pay | Admitting: Family Medicine

## 2023-07-25 MED ORDER — DONEPEZIL HCL 10 MG PO TABS: 10.0000 mg | ORAL_TABLET | Freq: Every day | ORAL | 4 refills | Status: DC

## 2023-07-25 NOTE — Telephone Encounter (Signed)
Prescription Request  07/25/2023  LOV: 05/05/2023  What is the name of the medication or equipment? donepezil (ARICEPT) 10 MG tablet   Have you contacted your pharmacy to request a refill? No   Which pharmacy would you like this sent to?  [id number], [place] - [address] [place] [address] Phone: [phone] Fax: [phone]    Patient notified that their request is being sent to the clinical staff for review and that they should receive a response within 2 business days.   Please advise at Mobile [phone] (mobile)  Patient would like a refill and all future refills to be sent to this pharmacy.

## 2023-07-25 NOTE — Telephone Encounter (Signed)
Rx sent electronically.  

## 2023-08-08 ENCOUNTER — Ambulatory Visit (HOSPITAL_COMMUNITY)
Admission: RE | Admit: 2023-08-08 | Discharge: 2023-08-08 | Disposition: A | Discharge status: Home / Self Care | Payer: Medicare HMO | Source: Ambulatory Visit | Attending: Pulmonary Disease | Admitting: Pulmonary Disease

## 2023-08-08 DIAGNOSIS — I700 Atherosclerosis of aorta: Secondary | ICD-10-CM | POA: Diagnosis not present

## 2023-08-08 DIAGNOSIS — R911 Solitary pulmonary nodule: Secondary | ICD-10-CM | POA: Insufficient documentation

## 2023-08-08 DIAGNOSIS — J439 Emphysema, unspecified: Secondary | ICD-10-CM | POA: Diagnosis not present

## 2023-08-08 DIAGNOSIS — R918 Other nonspecific abnormal finding of lung field: Secondary | ICD-10-CM | POA: Diagnosis not present

## 2023-08-23 MED ORDER — DONEPEZIL HCL 5 MG PO TABS: 5.0000 mg | ORAL_TABLET | Freq: Every day | ORAL | 1 refills | Status: DC

## 2023-08-23 NOTE — Addendum Note (Signed)
Addended by: Eustaquio Boyden on: 08/23/2023 08:55 AM   Modules accepted: Orders

## 2023-08-23 NOTE — Telephone Encounter (Signed)
Spoke with daughter at husband's appt - donepezil recently restarted at 10mg , caused nausea/vomiting so stopped. Will trial lower dose.

## 2023-08-27 ENCOUNTER — Telehealth: Payer: Self-pay | Admitting: Family Medicine

## 2023-08-27 ENCOUNTER — Other Ambulatory Visit (INDEPENDENT_AMBULATORY_CARE_PROVIDER_SITE_OTHER): Payer: Medicare HMO

## 2023-08-27 ENCOUNTER — Other Ambulatory Visit: Payer: Self-pay | Admitting: Family Medicine

## 2023-08-27 DIAGNOSIS — R413 Other amnesia: Secondary | ICD-10-CM

## 2023-08-27 DIAGNOSIS — R4182 Altered mental status, unspecified: Secondary | ICD-10-CM

## 2023-08-27 LAB — POC URINALSYSI DIPSTICK (AUTOMATED)
Bilirubin, UA: NEGATIVE
Blood, UA: NEGATIVE
Glucose, UA: NEGATIVE
Ketones, UA: NEGATIVE
Leukocytes, UA: NEGATIVE
Protein, UA: NEGATIVE
Spec Grav, UA: 1.01 (ref 1.010–1.025)
Urobilinogen, UA: 0.2 U/dL
pH, UA: 6 (ref 5.0–8.0)

## 2023-08-27 NOTE — Telephone Encounter (Signed)
Seen at husband's appt.  Notes stronger smelling urine, dark urine, increased thirst.  Possibly increased memory trouble.  Using depends diapers 24 hours a day.  I will ask to do UA today.

## 2023-08-27 NOTE — Telephone Encounter (Addendum)
POCT UA completed and results entered into chart. Urine spun down and Dr. Sharen Hones has been made aware.

## 2023-08-27 NOTE — Telephone Encounter (Signed)
Microscopy as per results. UCx sent.

## 2023-08-27 NOTE — Addendum Note (Signed)
Addended by: Donnamarie Poag on: 08/27/2023 04:39 PM   Modules accepted: Orders

## 2023-08-29 LAB — URINE CULTURE
MICRO NUMBER:: 15725445
SPECIMEN QUALITY:: ADEQUATE

## 2023-08-29 MED ORDER — SULFAMETHOXAZOLE-TRIMETHOPRIM 800-160 MG PO TABS: 1.0000 | ORAL_TABLET | Freq: Two times a day (BID) | ORAL | 0 refills | Status: DC

## 2023-08-29 NOTE — Telephone Encounter (Signed)
Called husband on dpr. Gave information. Will go by and pick up medications. Will call if any questions or changes in symptoms.

## 2023-08-29 NOTE — Addendum Note (Signed)
Addended by: Eustaquio Boyden on: 08/29/2023 01:54 PM   Modules accepted: Orders

## 2023-08-29 NOTE — Telephone Encounter (Signed)
UCx grew ESBL E coli - Rx bactrim twice daily for 3 days.

## 2023-09-04 NOTE — Progress Notes (Signed)
I dont see that she had an appt after her ct chest. But she needs a repeat scan in 6 months with follow up  Thanks,  BLI  Josephine Igo, DO [facility] 09/04/2023 4:06 PM

## 2023-09-09 ENCOUNTER — Encounter: Payer: Self-pay | Admitting: Acute Care

## 2023-09-09 ENCOUNTER — Ambulatory Visit: Payer: Medicare HMO | Admitting: Acute Care

## 2023-09-09 VITALS — BP 160/100 | HR 89 | Ht 62.0 in | Wt 156.0 lb

## 2023-09-09 DIAGNOSIS — Z87891 Personal history of nicotine dependence: Secondary | ICD-10-CM | POA: Diagnosis not present

## 2023-09-09 DIAGNOSIS — C3491 Malignant neoplasm of unspecified part of right bronchus or lung: Secondary | ICD-10-CM

## 2023-09-09 DIAGNOSIS — R911 Solitary pulmonary nodule: Secondary | ICD-10-CM | POA: Diagnosis not present

## 2023-09-09 NOTE — Patient Instructions (Addendum)
It is good to see you today. The CT Chest shows a new nodule in the left lower lobe. This is still very small, so we will do a CT Chest after 11/08/2023. You will get a call to schedule this. I will have you follow up with me after to review the results. If the nodule continues to grow, we will discuss options for biopsy vs treatment.  I understand you would not want any additional surgery. Follow up in January 2025 after scan. Call if you need [place] sooner. Please contact office for sooner follow up if symptoms do not improve or worsen or seek emergency care

## 2023-09-09 NOTE — Progress Notes (Signed)
History of Present Illness Lindsay Mason is a 72 y.o. female former smoker quit 2016 with a 36-pack-year smoking history referred to Dr. Tonia Brooms in November 2022 for abnormal lung nodule by Eustaquio Boyden, MD.  She has past medical history of moderate COPD, centrilobular emphysema, microscopic colitis, community-acquired pneumonia , abnormal pulmonary nodules, and right lower lobe lobectomy for suspected lung cancer  ( [place]) with tissue diagnosis of adenocarcinoma .  She also has some mild dementia and memory loss.  Synopsis 72 year old female, past medical history of moderate COPD, centrilobular emphysema, microscopic colitis, community-acquired pneumonia in 2013. In 2021 patient had a slowly changing some solid groundglass lesion within the right lower lobe and underwent a navigational bronchoscopy with dye marking and single anesthetic event for follow-up lobectomy by Dr. Cliffton Asters. This was diagnosed as a adenocarcinoma. She has had subsequent follow-up images. Serial imaging has showed 3 groundglass lesions within the left lower lobe. Lesion in the left lower lobe has showed some changes and progression to a larger size in comparison to imaging dating all the way back to 2017.  Follow-up CT scan completed beginning of October 2023 showed stability of the left lower lobe groundglass nodule, and plan was for continued surveillance imaging .     09/09/2023 Pt. Presents for follow up after repeat Ct Chest. We have been monitoring  her left lower lobe ground glass pulmonary nodule with imaging.  CT chest done 08/08/2023 she has a new spiculated left lower lobe nodule measuring 6x6 mm.  Per radiology this is nonspecific however may represent a small bronchogenic neoplasm.  I will reach out to both Dr. Cliffton Asters and Dr. Tonia Brooms to get their opinion regarding repeat tissue sampling versus waiting 3 months to reevaluate this nodule with imaging.  On my observation this nodule does appear a bit more  solid in the October 2024 scan than it did in the October 2023 scan.  The nodule is too small for PET avidity however with her history of lung cancer this does concern me.  Patient was here today with her husband and her son.  She has had some memory issues which the family felt got worse after anesthesia for her lobectomy.  Both the patient and her family have a strong preference of a nonsurgical option if this does indeed turn out to be another cancer.  Patient is otherwise doing well from a respiratory standpoint.  I will also consider placing patient on the next thoracic conference for discussion to determine best options moving forward.  Test Results: 08/08/2023 CT Chest  New spiculated subpleural left lower lobe nodule measuring 6 x 6 mm. This is nonspecific, however may represent small bronchogenic neoplasm. Consider multi disciplinary assessment. If tissue sampling is not pursued, short follow-up CT is recommended in 3 months. 2. Stable appearance of ill-defined ground-glass nodularity in the left lower lobe measuring 15 x 11 mm from most recent prior, but slowly enlarging from more remote exams. Attention at follow-up recommended. 3. Postsurgical change in the right hemithorax.  07/19/2022 CT Chest Stable postoperative chest CT status post right lower lobectomy. 2. Stable ill-defined ground-glass densities posteriorly in the left lower lobe without enlarging solid components. Based on changes from older prior studies, continued surveillance at 6-12 month intervals suggested. 3. No evidence of metastatic disease. 4. Stable left adrenal hyperplasia or small adenoma. 5. Aortic Atherosclerosis (ICD10-I70.0) and Emphysema (ICD10-J43.9).  Chest Imaging: September 2022 CT chest: Left lower lobe groundglass lesions.  Slowly enlarging since 2017.  October 2023 CT chest: Stable left lower lobe groundglass lesion     Pulmonary Functions Testing Results:     Latest Ref Rng & Units  08/02/2022   11:45 AM 01/03/2020    2:44 PM  PFT Results  FVC-Pre L 2.23  P 2.62  P  FVC-Predicted Pre % 85  P 96  P  FVC-Post L 2.48  P 2.76  P  FVC-Predicted Post % 95  P 101  P  Pre FEV1/FVC % % 62  P 65  P  Post FEV1/FCV % % 70  P 69  P  FEV1-Pre L 1.38  P 1.71  P  FEV1-Predicted Pre % 70  P 83  P  FEV1-Post L 1.74  P 1.91  P  DLCO uncorrected ml/min/mmHg   17.67  P  DLCO UNC% %   100  P  DLCO corrected ml/min/mmHg   17.21  P  DLCO COR %Predicted %   97  P  DLVA Predicted %   92  P  TLC L 4.78  P 5.46  P  TLC % Predicted % 103  P 118  P  RV % Predicted % 114  P 144  P       Latest Ref Rng & Units 01/19/2021    1:10 PM 01/22/2020    2:26 AM 01/21/2020    1:35 AM  CBC  WBC 4.0 - 10.5 K/uL 5.2  6.7  9.2   Hemoglobin 12.0 - 15.0 g/dL 69.6  29.5  28.4   Hematocrit 36.0 - 46.0 % 40.2  36.4  35.0   Platelets 150.0 - 400.0 K/uL 244.0  185  164        Latest Ref Rng & Units 04/28/2023    2:00 PM 04/24/2022    9:59 AM 01/19/2021    1:10 PM  BMP  Glucose 70 - 99 mg/dL 132  90  83   BUN 6 - 23 mg/dL 17  17  13    Creatinine 0.40 - 1.20 mg/dL 4.40  1.02  7.25   Sodium 135 - 145 mEq/L 142  140  142   Potassium 3.5 - 5.1 mEq/L 4.4  4.6  4.4   Chloride 96 - 112 mEq/L 105  104  106   CO2 19 - 32 mEq/L 27  31  31    Calcium 8.4 - 10.5 mg/dL 36.6  9.3  9.3     BNP No results found for: "BNP"  ProBNP No results found for: "PROBNP"  PFT    Component Value Date/Time   FEV1PRE 1.38 08/02/2022 1145   FEV1POST 1.74 08/02/2022 1145   FVCPRE 2.23 08/02/2022 1145   FVCPOST 2.48 08/02/2022 1145   TLC 4.78 08/02/2022 1145   DLCOUNC 17.67 01/03/2020 1444   PREFEV1FVCRT 62 08/02/2022 1145   PSTFEV1FVCRT 70 08/02/2022 1145    No results found.   Past medical hx 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   by CT -  moderate centrilobular and paraseptal   Ex-smoker 08/2014   currently using e cig   Microscopic colitis 06/2015   lymphocytic by colonoscopy - started entocort Christella Hartigan)   Osteoporosis, unspecified    Scoliosis of lumbar spine    Thoracic aortic atherosclerosis (HCC) 02/2016   by CT  Social History   Tobacco Use   Smoking status: Former    Current packs/day: 0.00    Average packs/day: 1 pack/day for 36.7 years (36.7 ttl pk-yrs)    Types: Cigarettes    Start date: 10/14/1978    Quit date: 07/15/2015    Years since quitting: 8.1   Smokeless tobacco: Never   Tobacco comments:    off and on smoker, now e-cigarettes  Vaping Use   Vaping status: Some Days   Devices: uses occasionally - no nicotine  Substance Use Topics   Alcohol use: Yes    Alcohol/week: 0.0 standard drinks of alcohol    Comment: social wine once a week   Drug use: No    Ms.Velardi reports that she quit smoking about 8 years ago. Her smoking use included cigarettes. She started smoking about 44 years ago. She has a 36.7 pack-year smoking history. She has never used smokeless tobacco. She reports current alcohol use. She reports that she does not use drugs.  Tobacco Cessation: Former smoker quit 2016 with a 36.7-pack-year smoking history  Past surgical hx, Family hx, Social hx all reviewed.  Current Outpatient Medications on File Prior to Visit  Medication Sig   albuterol (VENTOLIN HFA) 108 (90 Base) MCG/ACT inhaler Inhale 2 puffs into the lungs every 6 (six) hours as needed for wheezing or shortness of breath.   cyanocobalamin (VITAMIN B12) 1000 MCG tablet Take 1 tablet (1,000 mcg total) by mouth daily.   donepezil (ARICEPT) 5 MG tablet Take 1 tablet (5 mg total) by mouth at bedtime. for memory   Multiple Vitamin (MULTIVITAMIN) tablet Take 1 tablet by mouth daily.   sulfamethoxazole-trimethoprim (BACTRIM DS) 800-160 MG tablet Take 1 tablet by mouth 2 (two) times daily.   No current facility-administered  medications on file prior to visit.     Allergies  Allergen Reactions   Penicillins Swelling    REACTION: swelling tongue - States has taken Cephalosporins without difficulty in past Did it involve swelling of the face/tongue/throat, SOB, or low BP? Yes Did it involve sudden or severe rash/hives, skin peeling, or any reaction on the inside of your mouth or nose? No Did you need to seek medical attention at a hospital or doctor's office? Yes When did it last happen? Childhood reaction If all above answers are "NO", may proceed with cephalosporin use.     Review Of Systems:  Constitutional:   No  weight loss, night sweats,  Fevers, chills, fatigue, or  lassitude.  HEENT:   No headaches,  Difficulty swallowing,  Tooth/dental problems, or  Sore throat,                No sneezing, itching, ear ache, nasal congestion, post nasal drip,   CV:  No chest pain,  Orthopnea, PND, swelling in lower extremities, anasarca, dizziness, palpitations, syncope.   GI  No heartburn, indigestion, abdominal pain, nausea, vomiting, diarrhea, change in bowel habits, loss of appetite, bloody stools.   Resp: No shortness of breath with exertion or at rest.  No excess mucus, no productive cough,  No non-productive cough,  No coughing up of blood.  No change in color of mucus.  No wheezing.  No chest wall deformity  Skin: no rash or lesions.  GU: no dysuria, change in color of urine, no urgency or frequency.  No flank pain, no hematuria   MS:  No joint pain or swelling.  No decreased range of motion.  No back pain.  Psych:  No change  in mood or affect. No depression or anxiety.  No memory loss.  Neuro: Mild memory loss/dementia   Vital Signs BP (!) 160/100 (Cuff Size: Normal)   Pulse 89   Ht 5\' 2"  (1.575 m)   Wt 156 lb (70.8 kg)   SpO2 98%   BMI 28.53 kg/m    Physical Exam:  General- No distress,  A&Ox3, pleasant and appropriate ENT: No sinus tenderness, TM clear, pale nasal mucosa, no oral  exudate,no post nasal drip, no LAN Cardiac: S1, S2, regular rate and rhythm, no murmur Chest: No wheeze/ rales/ dullness; no accessory muscle use, no nasal flaring, no sternal retractions Abd.: Soft Non-tender, nondistended, bowel sounds positive,Body mass index is 28.53 kg/m.  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 Cliffton Asters Currently under surveillance imaging for groundglass opacities in the left lower lobe Plan The CT Chest shows a new nodule in the left lower lobe. This is still very small, so we will do a CT Chest after 11/08/2023. You will get a call to schedule this. I will have you follow up with me after to review the results. If the nodule continues to grow, we will discuss options for biopsy vs treatment.  I understand you would not want any additional surgery, as you feel like this is contributed to your memory issues. Plan will be to add patient to early December thoracic conference to discuss with the surgical and oncology team. I have messaged both Dr. Cliffton Asters and Dr. Tonia Brooms to review the CT chest. Follow up in January 2025 after scan to review results. Call if you need [place] sooner. Please contact office for sooner follow up if symptoms do not improve or worsen or seek emergency care     Former smoker with centrilobular emphysema Plan Use albuterol as needed for shortness of breath or wheezing  I spent 30 minutes dedicated to the care of this patient on the date of this encounter to include pre-visit review of records, face-to-face time with the patient discussing conditions above, post visit ordering of testing, clinical documentation with the electronic health record, making  appropriate referrals as documented, and communicating necessary information to the patient's healthcare team.    Bevelyn Ngo, NP 09/09/2023  2:51 PM

## 2023-09-22 ENCOUNTER — Encounter: Payer: Self-pay | Admitting: Acute Care

## 2023-09-29 ENCOUNTER — Ambulatory Visit (INDEPENDENT_AMBULATORY_CARE_PROVIDER_SITE_OTHER): Payer: Medicare HMO | Admitting: Podiatry

## 2023-09-29 ENCOUNTER — Encounter: Payer: Self-pay | Admitting: Podiatry

## 2023-09-29 DIAGNOSIS — M79675 Pain in left toe(s): Secondary | ICD-10-CM

## 2023-09-29 DIAGNOSIS — B351 Tinea unguium: Secondary | ICD-10-CM | POA: Diagnosis not present

## 2023-09-29 DIAGNOSIS — M79674 Pain in right toe(s): Secondary | ICD-10-CM | POA: Diagnosis not present

## 2023-09-29 NOTE — Progress Notes (Signed)
  Subjective:  Patient ID: Lindsay Mason, female    DOB: Jun 29, 1951,   MRN: [id number]  No chief complaint on file.   72 y.o. female presents for concern of thickened elongated and painful nails that are difficult to trim. Requesting to have them trimmed today.  PCP:  Eustaquio Boyden, MD    . Denies any other pedal complaints. Denies n/v/f/c.   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 CFT <3 seconds. Normal hair growth on digits. No edema.  Skin. No lacerations or abrasions bilateral feet. Nails 1-5 bilateral are elongated thickened and with subungual debris.  Musculoskeletal: MMT 5/5 bilateral lower extremities in DF, PF, Inversion and Eversion. Deceased ROM in DF of ankle joint.  Neurological: Sensation intact to light touch.   Assessment:   1. Pain due to onychomycosis of toenails of both feet      Plan:  Patient was evaluated and treated and all questions answered. -Mechanically debrided all nails 1-5 bilateral using sterile nail nipper and filed with dremel without incident  -Answered all patient questions -Patient to return  in 3 months for at risk foot care -Patient advised to call the office if any problems or questions arise in the meantime.   Louann Sjogren, DPM

## 2023-10-24 ENCOUNTER — Other Ambulatory Visit: Payer: Medicare HMO

## 2023-10-24 ENCOUNTER — Ambulatory Visit
Admission: RE | Admit: 2023-10-24 | Discharge: 2023-10-24 | Disposition: A | Discharge status: Home / Self Care | Payer: Medicare HMO | Source: Ambulatory Visit | Attending: Acute Care | Admitting: Acute Care

## 2023-10-24 DIAGNOSIS — R911 Solitary pulmonary nodule: Secondary | ICD-10-CM

## 2023-10-24 DIAGNOSIS — D3502 Benign neoplasm of left adrenal gland: Secondary | ICD-10-CM | POA: Diagnosis not present

## 2023-10-24 DIAGNOSIS — I700 Atherosclerosis of aorta: Secondary | ICD-10-CM | POA: Diagnosis not present

## 2023-10-24 DIAGNOSIS — R918 Other nonspecific abnormal finding of lung field: Secondary | ICD-10-CM | POA: Diagnosis not present

## 2023-10-24 DIAGNOSIS — J439 Emphysema, unspecified: Secondary | ICD-10-CM | POA: Diagnosis not present

## 2023-11-05 ENCOUNTER — Ambulatory Visit: Payer: Medicare HMO | Admitting: Family Medicine

## 2023-11-05 ENCOUNTER — Encounter: Payer: Self-pay | Admitting: Family Medicine

## 2023-11-05 VITALS — BP 134/84 | HR 66 | Temp 98.0°F | Ht 62.0 in | Wt 148.1 lb

## 2023-11-05 DIAGNOSIS — K219 Gastro-esophageal reflux disease without esophagitis: Secondary | ICD-10-CM

## 2023-11-05 DIAGNOSIS — R911 Solitary pulmonary nodule: Secondary | ICD-10-CM | POA: Diagnosis not present

## 2023-11-05 DIAGNOSIS — Z85118 Personal history of other malignant neoplasm of bronchus and lung: Secondary | ICD-10-CM

## 2023-11-05 DIAGNOSIS — F05 Delirium due to known physiological condition: Secondary | ICD-10-CM

## 2023-11-05 DIAGNOSIS — Z87891 Personal history of nicotine dependence: Secondary | ICD-10-CM

## 2023-11-05 DIAGNOSIS — Z902 Acquired absence of lung [part of]: Secondary | ICD-10-CM | POA: Diagnosis not present

## 2023-11-05 DIAGNOSIS — E538 Deficiency of other specified B group vitamins: Secondary | ICD-10-CM

## 2023-11-05 DIAGNOSIS — R634 Abnormal weight loss: Secondary | ICD-10-CM

## 2023-11-05 DIAGNOSIS — J439 Emphysema, unspecified: Secondary | ICD-10-CM

## 2023-11-05 DIAGNOSIS — F03B0 Unspecified dementia, moderate, without behavioral disturbance, psychotic disturbance, mood disturbance, and anxiety: Secondary | ICD-10-CM | POA: Diagnosis not present

## 2023-11-05 DIAGNOSIS — R519 Headache, unspecified: Secondary | ICD-10-CM

## 2023-11-05 LAB — POC URINALSYSI DIPSTICK (AUTOMATED)
Bilirubin, UA: NEGATIVE
Blood, UA: NEGATIVE
Glucose, UA: NEGATIVE
Ketones, UA: NEGATIVE
Nitrite, UA: NEGATIVE
Protein, UA: NEGATIVE
Spec Grav, UA: 1.015 (ref 1.010–1.025)
Urobilinogen, UA: 0.2 U/dL
pH, UA: 6 (ref 5.0–8.0)

## 2023-11-05 NOTE — Patient Instructions (Addendum)
Repeat urinalysis today.  Hold donepezil for 2 weeks to see effect on headaches. Update me with effect.  Check blood pressure when you get a headache.  May try famotidine (pepcid) 20mg  nightly for reflux/regurgitation.  Could try melatonin 5mg  at night for sleep.  Call us with questions.

## 2023-11-05 NOTE — Progress Notes (Unsigned)
Ph: [phone] Fax: [phone]   Patient ID: Lindsay Mason, female    DOB: [birth year], 73 y.o.   MRN: [id number]  This visit was conducted in person.  BP 134/84   Pulse 66   Temp 98 F (36.7 C) (Oral)   Ht 5\' 2"  (1.575 m)   Wt 148 lb 2 oz (67.2 kg)   SpO2 99%   BMI 27.09 kg/m    CC: 6 mo memory f/u visit  Subjective:   HPI: Lindsay Mason is a 73 y.o. female presenting on 11/05/2023 for Medical Management of Chronic Issues (Here for 6 mo f/u. Wants to discuss ongoing rx for UTI prevention. Pt accompanied by husband, Dorinda Hill and son, Thayer Ohm. )   See prior notes for details.  Known dementia on donepezil 5mg  daily - trouble tolerating 10mg  dose. Having some regurgitation /reflux on aricept. Notes ST over the past 2-3 weeks as well as worsening headaches and night time confusion. HAs treated with tylenol with benefit.   Progressive memory deterioration. MMSE 06/2021 - 20/30, CDT 2/4. Previous workup - B12 low s/p replacement, TSH and RPR normal, head CT stable. Independent in ADLs, dependent in all IADLs.  Son requests neurology evaluation in [place].  Last treated for ESBL E coli UTI with bactrim 3d course 08/29/2023.  Has been wearing depends regularly.   Last saw pulm 08/2023 for known R lung adenocarcinoma s/p lobectomy 2021.  Currently under surveillance for LLL opacities - completed CT chest earlier this month with planned pulm/onc f/u later this month.   BP stable off antihypertensive (previously on amlodipine).  16 lb weight loss since 04/2023 - attributed to healthy diet changes.       Relevant past medical, surgical, family and social history reviewed and updated as indicated. Interim medical history since our last visit reviewed. Allergies and medications reviewed and updated. Outpatient Medications Prior to Visit  Medication Sig Dispense Refill   albuterol (VENTOLIN HFA) 108 (90 Base) MCG/ACT inhaler Inhale 2 puffs into the lungs every 6 (six) hours as  needed for wheezing or shortness of breath. 8 g 6   cyanocobalamin (VITAMIN B12) 1000 MCG tablet Take 1 tablet (1,000 mcg total) by mouth daily. 90 tablet 3   donepezil (ARICEPT) 5 MG tablet Take 1 tablet (5 mg total) by mouth at bedtime. for memory 90 tablet 1   Multiple Vitamin (MULTIVITAMIN) tablet Take 1 tablet by mouth daily.     sulfamethoxazole-trimethoprim (BACTRIM DS) 800-160 MG tablet Take 1 tablet by mouth 2 (two) times daily. 6 tablet 0   No facility-administered medications prior to visit.     Per HPI unless specifically indicated in ROS section below Review of Systems  Objective:  BP 134/84   Pulse 66   Temp 98 F (36.7 C) (Oral)   Ht 5\' 2"  (1.575 m)   Wt 148 lb 2 oz (67.2 kg)   SpO2 99%   BMI 27.09 kg/m   Wt Readings from Last 3 Encounters:  11/05/23 148 lb 2 oz (67.2 kg)  09/09/23 156 lb (70.8 kg)  05/05/23 164 lb 6 oz (74.6 kg)      Physical Exam Vitals and nursing note reviewed.  Constitutional:      Appearance: Normal appearance. She is not ill-appearing.  HENT:     Head: Normocephalic and atraumatic.     Mouth/Throat:     Mouth: Mucous membranes are moist.     Pharynx: Oropharynx is clear. No oropharyngeal exudate or posterior oropharyngeal erythema.  Eyes:     Extraocular Movements: Extraocular movements intact.     Conjunctiva/sclera: Conjunctivae normal.     Pupils: Pupils are equal, round, and reactive to light.  Cardiovascular:     Rate and Rhythm: Normal rate and regular rhythm.     Pulses: Normal pulses.     Heart sounds: Normal heart sounds. No murmur heard. Pulmonary:     Effort: Pulmonary effort is normal. No respiratory distress.     Breath sounds: Normal breath sounds. No wheezing, rhonchi or rales.  Abdominal:     General: There is no distension.     Palpations: Abdomen is soft. There is no mass.     Tenderness: There is no abdominal tenderness. There is no guarding or rebound.     Hernia: No hernia is present.  Musculoskeletal:      Right lower leg: No edema.     Left lower leg: No edema.  Skin:    General: Skin is warm and dry.     Findings: No rash.  Neurological:     Mental Status: She is alert.  Psychiatric:        Mood and Affect: Mood normal.        Behavior: Behavior normal.       Results for orders placed or performed in visit on 08/27/23  POCT Urinalysis Dipstick (Automated)   Collection Time: 08/27/23  3:14 PM  Result Value Ref Range   Color, UA yellow    Clarity, UA clear    Glucose, UA Negative Negative   Bilirubin, UA neg    Ketones, UA neg    Spec Grav, UA 1.010 1.010 - 1.025   Blood, UA neg    pH, UA 6.0 5.0 - 8.0   Protein, UA Negative Negative   Urobilinogen, UA 0.2 0.2 or 1.0 E.U./dL   Nitrite, UA postitive    Leukocytes, UA Negative Negative  Urine Culture   Collection Time: 08/27/23  4:40 PM   Specimen: Urine  Result Value Ref Range   MICRO NUMBER: [id number]    SPECIMEN QUALITY: Adequate    Sample Source URINE    STATUS: FINAL    ISOLATE 1: ESBL Escherichia coli (A)       Susceptibility   Esbl escherichia coli - URINE CULTURE, REFLEX    AMOX/CLAVULANIC 8 Sensitive     AMPICILLIN* >=32 Resistant      * Extended spectrum beta-lactamase (ESBL) producing organisms demonstrate decreased activity with penicillins, cephalosporins and aztreonam.     AMPICILLIN/SULBACTAM 16 Intermediate     CEFAZOLIN* >=64 Resistant      * Extended spectrum beta-lactamase (ESBL) producing organisms demonstrate decreased activity with penicillins, cephalosporins and aztreonam. For uncomplicated UTI caused by E. coli, K. pneumoniae or P. mirabilis: Cefazolin is susceptible if MIC <32 mcg/mL and predicts susceptible to the oral agents cefaclor, cefdinir, cefpodoxime, cefprozil, cefuroxime, cephalexin and loracarbef.     CEFTAZIDIME 4 Sensitive     CEFEPIME 2 Sensitive     CEFTRIAXONE >=64 Resistant     CIPROFLOXACIN <=0.25 Sensitive     LEVOFLOXACIN 1 Intermediate     GENTAMICIN <=1  Sensitive     IMIPENEM <=0.25 Sensitive     NITROFURANTOIN <=16 Sensitive     PIP/TAZO <=4 Sensitive     TOBRAMYCIN <=1 Sensitive     TRIMETH/SULFA* <=20 Sensitive      * Extended spectrum beta-lactamase (ESBL) producing organisms demonstrate decreased activity with penicillins, cephalosporins and aztreonam. For uncomplicated UTI caused by E. coli, K.  pneumoniae or P. mirabilis: Cefazolin is susceptible if MIC <32 mcg/mL and predicts susceptible to the oral agents cefaclor, cefdinir, cefpodoxime, cefprozil, cefuroxime, cephalexin and loracarbef. Legend: S = Susceptible  I = Intermediate R = Resistant  NS = Not susceptible SDD = Susceptible Dose Dependent * = Not Tested  NR = Not Reported **NN = See Therapy Comments     Assessment & Plan:   Problem List Items Addressed This Visit   None    No orders of the defined types were placed in this encounter.   No orders of the defined types were placed in this encounter.   There are no Patient Instructions on file for this visit.  Follow up plan: No follow-ups on file.  Eustaquio Boyden, MD

## 2023-11-06 ENCOUNTER — Encounter: Payer: Self-pay | Admitting: Family Medicine

## 2023-11-06 DIAGNOSIS — K219 Gastro-esophageal reflux disease without esophagitis: Secondary | ICD-10-CM | POA: Insufficient documentation

## 2023-11-06 DIAGNOSIS — F05 Delirium due to known physiological condition: Secondary | ICD-10-CM | POA: Insufficient documentation

## 2023-11-06 DIAGNOSIS — R519 Headache, unspecified: Secondary | ICD-10-CM

## 2023-11-06 DIAGNOSIS — F03B0 Unspecified dementia, moderate, without behavioral disturbance, psychotic disturbance, mood disturbance, and anxiety: Secondary | ICD-10-CM

## 2023-11-06 DIAGNOSIS — R930 Abnormal findings on diagnostic imaging of skull and head, not elsewhere classified: Secondary | ICD-10-CM

## 2023-11-06 MED ORDER — SULFAMETHOXAZOLE-TRIMETHOPRIM 800-160 MG PO TABS: 1.0000 | ORAL_TABLET | Freq: Two times a day (BID) | ORAL | 0 refills | Status: DC

## 2023-11-06 MED ORDER — CEPHALEXIN 500 MG PO CAPS: 500.0000 mg | ORAL_CAPSULE | Freq: Two times a day (BID) | ORAL | 0 refills | Status: DC

## 2023-11-06 NOTE — Assessment & Plan Note (Signed)
They note worsening trouble with this which may correlate to donepezil use - suggested pepcid 20mg  nightly, suggested trial off donepezil as per above.

## 2023-11-06 NOTE — Assessment & Plan Note (Signed)
Noted worsening frontal headaches in evenings possibly temporally related to donepezil administration. Tylenol helps.  Recommend holding donepezil for the next 2 weeks to note effect on headaches and reflux as per below.  If resolved, will stay off donepezil, consider Namenda.

## 2023-11-06 NOTE — Assessment & Plan Note (Signed)
Regularly sees pulm and TCTS.

## 2023-11-06 NOTE — Assessment & Plan Note (Signed)
16 lb weight loss over the past 6 months - family attributes to healthier diet choices.

## 2023-11-06 NOTE — Assessment & Plan Note (Addendum)
Ongoing difficulty with worsening sundowning. Presumed Alzheimer's vs vascular dementia. Husband had been sole caregiver, now seems to be doing better since children have become more actively involved in their parent's care.  Does not tolerate donepezil 10mg  - nausea/vomiting. Has done better on donepezil 5mg  nightly however now concern for associated acid reflux/regurgitation and headaches - will hold for 2 weeks as per below, monitoring effect on HA/reflux.  Consider namenda.  They previously declined palliative care services.  Family requests neurology evaluation in [place] - will refer.

## 2023-11-06 NOTE — Assessment & Plan Note (Addendum)
Worse confusion, difficulty in evenings.  Update UA -- abnormal. UCx sent. Start bactrim antibiotic 5d course (previously treated UTI grew ESBL E coli 08/2023).

## 2023-11-06 NOTE — Assessment & Plan Note (Signed)
Regularly supplementing with OTC oral daily.

## 2023-11-06 NOTE — Assessment & Plan Note (Deleted)
Regularly sees pulm and TCTS.

## 2023-11-06 NOTE — Assessment & Plan Note (Signed)
Noted on imaging, long smoking history. Pt asxs from respiratory standpoint, off respiratory medications

## 2023-11-06 NOTE — Assessment & Plan Note (Signed)
Monitoring developing LLL opacities in h/o R lung cancer. They have upcoming pulm f/u to review latest lung imaging

## 2023-11-07 ENCOUNTER — Ambulatory Visit: Payer: Medicare HMO | Admitting: Acute Care

## 2023-11-07 LAB — URINE CULTURE
MICRO NUMBER:: 15984139
SPECIMEN QUALITY:: ADEQUATE

## 2023-11-10 DIAGNOSIS — Z008 Encounter for other general examination: Secondary | ICD-10-CM | POA: Diagnosis not present

## 2023-11-10 MED ORDER — NITROFURANTOIN MONOHYD MACRO 100 MG PO CAPS: 100.0000 mg | ORAL_CAPSULE | Freq: Two times a day (BID) | ORAL | 0 refills | Status: AC

## 2023-11-10 NOTE — Addendum Note (Signed)
Addended by: Eustaquio Boyden on: 11/10/2023 07:58 AM   Modules accepted: Orders

## 2023-11-13 ENCOUNTER — Encounter: Payer: Self-pay | Admitting: Family Medicine

## 2023-11-17 ENCOUNTER — Other Ambulatory Visit: Payer: Self-pay | Admitting: Family Medicine

## 2023-11-17 DIAGNOSIS — N390 Urinary tract infection, site not specified: Secondary | ICD-10-CM

## 2023-11-21 ENCOUNTER — Ambulatory Visit (INDEPENDENT_AMBULATORY_CARE_PROVIDER_SITE_OTHER): Payer: Medicare HMO | Admitting: Acute Care

## 2023-11-21 ENCOUNTER — Encounter: Payer: Self-pay | Admitting: Acute Care

## 2023-11-21 VITALS — BP 116/74 | HR 78 | Temp 98.3°F | Ht 62.0 in | Wt 148.6 lb

## 2023-11-21 DIAGNOSIS — G8929 Other chronic pain: Secondary | ICD-10-CM | POA: Diagnosis not present

## 2023-11-21 DIAGNOSIS — Z853 Personal history of malignant neoplasm of breast: Secondary | ICD-10-CM

## 2023-11-21 DIAGNOSIS — R6889 Other general symptoms and signs: Secondary | ICD-10-CM

## 2023-11-21 DIAGNOSIS — R519 Headache, unspecified: Secondary | ICD-10-CM | POA: Diagnosis not present

## 2023-11-21 DIAGNOSIS — R911 Solitary pulmonary nodule: Secondary | ICD-10-CM

## 2023-11-21 NOTE — Progress Notes (Signed)
 History of Present Illness Lindsay Mason is a 73 y.o. female former smoker quit 2016 with a 36-pack-year smoking history referred to Dr. Brenna in November 2022 for abnormal lung nodule by [name], MD.  She has past medical history of moderate COPD, centrilobular emphysema, microscopic colitis, community-acquired pneumonia , abnormal pulmonary nodules, and right lower lobe lobectomy for suspected lung cancer  ( [facility]) with tissue diagnosis of adenocarcinoma .  She also has some mild dementia and memory loss.    Synopsis 73 year old female, past medical history of moderate COPD, centrilobular emphysema, microscopic colitis, community-acquired pneumonia in 2013. In 2021 patient had a slowly changing some solid groundglass lesion within the right lower lobe and underwent a navigational bronchoscopy with dye marking and single anesthetic event for follow-up lobectomy by Dr. Shyrl. This was diagnosed as a adenocarcinoma. She has had subsequent follow-up images. Serial imaging has showed 3 groundglass lesions within the left lower lobe. Lesion in the left lower lobe has showed some changes and progression to a larger size in comparison to imaging dating all the way back to 2017.  Follow-up CT scan completed beginning of October 2023 showed stability of the left lower lobe groundglass nodule, and plan was for continued surveillance imaging .    11/21/2023 Pt. Presents for nodule follow up after surveillance imaging after diagnosis of adenocarcinoma lobectomy by Dr. Shyrl.The last Ct chest done 10/24/2023 shows the left lower lobe mass more prominent than previous CT imaging.  Pt. States she feels she is stable from a pulmonary perspective. We will do a PET scan to follow up on this nodule. We will see the patient after to review the results. Pt. Has been having headaches and she has had falls. She has an MR Brain scheduled  that was ordered by her PCP to better evaluate for etiology.  This will also assess for metastatic disease.  Test Results: CT chest 10/24/2023 Somewhat amorphous but irregular ground-glass nodule in the left lower lobe, stable from 01/11/2022 but slightly more prominent than on 12/29/2020. Low-grade adenocarcinoma can have this appearance. Recommend annual follow-up CT chest without contrast. 2. 5 mm subpleural left lower lobe nodule, stable. Recommend continued attention on follow-up. 3. Left adrenal adenoma. 4.  Aortic atherosclerosis (ICD10-I70.0). 5.  Emphysema (ICD10-J43.9).  08/08/2023 CT Chest  New spiculated subpleural left lower lobe nodule measuring 6 x 6 mm. This is nonspecific, however may represent small bronchogenic neoplasm. Consider multi disciplinary assessment. If tissue sampling is not pursued, short follow-up CT is recommended in 3 months. 2. Stable appearance of ill-defined ground-glass nodularity in the left lower lobe measuring 15 x 11 mm from most recent prior, but slowly enlarging from more remote exams. Attention at follow-up recommended. 3. Postsurgical change in the right hemithorax.   07/19/2022 CT Chest Stable postoperative chest CT status post right lower lobectomy. 2. Stable ill-defined ground-glass densities posteriorly in the left lower lobe without enlarging solid components. Based on changes from older prior studies, continued surveillance at 6-12 month intervals suggested. 3. No evidence of metastatic disease. 4. Stable left adrenal hyperplasia or small adenoma. 5. Aortic Atherosclerosis (ICD10-I70.0) and Emphysema (ICD10-J43.9).   Chest Imaging: September 2022 CT chest: Left lower lobe groundglass lesions.  Slowly enlarging since 2017.     October 2023 CT chest: Stable left lower lobe groundglass lesion Pulmonary Functions Testing Results:     Latest Ref Rng & Units 08/02/2022   11:45 AM 01/03/2020    2:44 PM  PFT Results  FVC-Pre L 2.23  P 2.62  P  FVC-Predicted Pre % 85  P 96  P  FVC-Post L  2.48  P 2.76  P  FVC-Predicted Post % 95  P 101  P  Pre FEV1/FVC % % 62  P 65  P  Post FEV1/FCV % % 70  P 69  P  FEV1-Pre L 1.38  P 1.71  P  FEV1-Predicted Pre % 70  P 83  P  FEV1-Post L 1.74  P 1.91  P  DLCO uncorrected ml/min/mmHg   17.67  P  DLCO UNC% %   100  P  DLCO corrected ml/min/mmHg   17.21  P  DLCO COR %Predicted %   97  P  DLVA Predicted %   92  P  TLC L 4.78  P 5.46  P  TLC % Predicted % 103  P 118  P  RV % Predicted % 114  P 144  P          Latest Ref Rng & Units 01/19/2021    1:10 PM 01/22/2020    2:26 AM 01/21/2020    1:35 AM  CBC  WBC 4.0 - 10.5 K/uL 5.2  6.7  9.2   Hemoglobin 12.0 - 15.0 g/dL 86.6  88.4  88.3   Hematocrit 36.0 - 46.0 % 40.2  36.4  35.0   Platelets 150.0 - 400.0 K/uL 244.0  185  164        Latest Ref Rng & Units 04/28/2023    2:00 PM 04/24/2022    9:59 AM 01/19/2021    1:10 PM  BMP  Glucose 70 - 99 mg/dL 895  90  83   BUN 6 - 23 mg/dL 17  17  13    Creatinine 0.40 - 1.20 mg/dL 9.02  9.12  9.22   Sodium 135 - 145 mEq/L 142  140  142   Potassium 3.5 - 5.1 mEq/L 4.4  4.6  4.4   Chloride 96 - 112 mEq/L 105  104  106   CO2 19 - 32 mEq/L 27  31  31    Calcium  8.4 - 10.5 mg/dL 89.7  9.3  9.3     BNP No results found for: BNP  ProBNP No results found for: PROBNP  PFT    Component Value Date/Time   FEV1PRE 1.38 08/02/2022 1145   FEV1POST 1.74 08/02/2022 1145   FVCPRE 2.23 08/02/2022 1145   FVCPOST 2.48 08/02/2022 1145   TLC 4.78 08/02/2022 1145   DLCOUNC 17.67 01/03/2020 1444   PREFEV1FVCRT 62 08/02/2022 1145   PSTFEV1FVCRT 70 08/02/2022 1145    CT CHEST WO CONTRAST Result Date: 11/03/2023 CLINICAL DATA:  Lung nodule. History of lung cancer. * Tracking Code: BO * EXAM: CT CHEST WITHOUT CONTRAST TECHNIQUE: Multidetector CT imaging of the chest was performed following the standard protocol without IV contrast. RADIATION DOSE REDUCTION: This exam was performed according to the departmental dose-optimization program which includes  automated exposure control, adjustment of the mA and/or kV according to patient size and/or use of iterative reconstruction technique. COMPARISON:  08/08/2023, 07/19/2022, 01/11/2022 and 12/29/2020. FINDINGS: Cardiovascular: Atherosclerotic calcification of the aorta. Heart size normal. No pericardial effusion. Mediastinum/Nodes: No pathologically enlarged mediastinal or axillary lymph nodes. Hilar regions are difficult to definitively evaluate without IV contrast. Esophagus is grossly unremarkable. Lungs/Pleura: Centrilobular and paraseptal emphysema. Right lower lobectomy. Somewhat amorphous but irregular ground-glass in the posterior left lower lobe measures approximately 1.1 x 2.5 cm, stable from 01/11/2022 but slightly more prominent than on 12/29/2020.  No solid components. 5 mm subpleural posterior left upper lobe nodule (8/74), stable from 08/08/2023. No pleural fluid. Airway is otherwise unremarkable. Upper Abdomen: Small hepatic cyst. No specific follow-up necessary. 2.9 cm left adrenal nodule measures -3 Hounsfield units. No specific follow-up necessary. 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 No specific follow-up necessary. Visualized portions of the liver, gallbladder, adrenal glands, kidneys, spleen, pancreas, stomach and bowel are otherwise grossly unremarkable. No upper abdominal adenopathy. Musculoskeletal: Degenerative changes in the spine. Osteopenia chronic mild compression of the T3 superior endplate. IMPRESSION: 1. Somewhat amorphous but irregular ground-glass nodule in the left lower lobe, stable from 01/11/2022 but slightly more prominent than on 12/29/2020. Low-grade adenocarcinoma can have this appearance. Recommend annual follow-up CT chest without contrast. 2. 5 mm subpleural left lower lobe nodule, stable. Recommend continued attention on follow-up. 3. Left adrenal adenoma. 4.  Aortic atherosclerosis (ICD10-I70.0). 5.  Emphysema (ICD10-J43.9).  Electronically Signed   By: Newell Eke M.D.   On: 11/03/2023 13:39     Past medical hx 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   by CT - moderate centrilobular and paraseptal   Ex-smoker 08/2014   currently using e cig   Microscopic colitis 06/2015   lymphocytic by colonoscopy - started entocort [name])   Osteoporosis, unspecified    Scoliosis of lumbar spine    Thoracic aortic atherosclerosis (HCC) 02/2016   by CT     Social History   Tobacco Use   Smoking status: Former    Current packs/day: 0.00    Average packs/day: 1 pack/day for 36.7 years (36.7 ttl pk-yrs)    Types: Cigarettes    Start date: 10/14/1978    Quit date: 07/15/2015    Years since quitting: 8.3   Smokeless tobacco: Never   Tobacco comments:    off and on smoker, now e-cigarettes  Vaping Use   Vaping status: Some Days   Devices: uses occasionally - no nicotine  Substance Use Topics   Alcohol use: Yes    Alcohol/week: 0.0 standard drinks of alcohol    Comment: social wine once a week   Drug use: No    Ms.Misko reports that she quit smoking about 8 years ago. Her smoking use included cigarettes. She started smoking about 45 years ago. She has a 36.7 pack-year smoking history. She has never used smokeless tobacco. She reports current alcohol use. She reports that she does not use drugs.  Tobacco Cessation: Former tobacco smoker, quit 2016 with a 36.7 pack year smoking history Now smokes vape pen  Past surgical hx, Family hx, Social hx all reviewed.  Current Outpatient Medications on File Prior to Visit  Medication Sig   albuterol  (VENTOLIN  HFA) 108 (90 Base) MCG/ACT inhaler Inhale 2 puffs into the lungs every 6 (six) hours as needed for wheezing or shortness of breath.   cyanocobalamin  (VITAMIN B12) 1000 MCG tablet Take 1 tablet  (1,000 mcg total) by mouth daily.   donepezil  (ARICEPT ) 5 MG tablet Take 1 tablet (5 mg total) by mouth at bedtime. for memory   Multiple Vitamin (MULTIVITAMIN) tablet Take 1 tablet by mouth daily.   No current facility-administered medications on file prior to visit.     Allergies  Allergen Reactions   Penicillins Swelling  REACTION: swelling tongue - States has taken Cephalosporins without difficulty in past Did it involve swelling of the face/tongue/throat, SOB, or low BP? Yes Did it involve sudden or severe rash/hives, skin peeling, or any reaction on the inside of your mouth or nose? No Did you need to seek medical attention at a hospital or doctor's office? Yes When did it last happen? Childhood reaction If all above answers are "NO", may proceed with cephalosporin use.     Review Of Systems:  Constitutional:   No  weight loss, night sweats,  Fevers, chills, fatigue, or  lassitude.  HEENT:   No headaches,  Difficulty swallowing,  Tooth/dental problems, or  Sore throat,                No sneezing, itching, ear ache, nasal congestion, post nasal drip,   CV:  No chest pain,  Orthopnea, PND, swelling in lower extremities, anasarca, dizziness, palpitations, syncope.   GI  No heartburn, indigestion, abdominal pain, nausea, vomiting, diarrhea, change in bowel habits, loss of appetite, bloody stools.   Resp: No shortness of breath with exertion or at rest.  No excess mucus, no productive cough,  No non-productive cough,  No coughing up of blood.  No change in color of mucus.  No wheezing.  No chest wall deformity  Skin: no rash or lesions.  GU: no dysuria, change in color of urine, no urgency or frequency.  No flank pain, no hematuria   MS:  No joint pain or swelling.  No decreased range of motion.  No back pain.  Psych:  No change in mood or affect. No depression or anxiety.  + memory loss.   Vital Signs BP 116/74 (BP Location: Right Arm, Patient Position: Sitting, Cuff  Size: Normal)   Pulse 78   Temp 98.3 F (36.8 C) (Oral)   Ht 5' 2 (1.575 m)   Wt 148 lb 9.6 oz (67.4 kg)   SpO2 99%   BMI 27.18 kg/m    Physical Exam:  General- No distress,  A&Ox3, pleasant ENT: No sinus tenderness, TM clear, pale nasal mucosa, no oral exudate,no post nasal drip, no LAN Cardiac: S1, S2, regular rate and rhythm, no murmur Chest: No wheeze/ rales/ dullness; no accessory muscle use, no nasal flaring, no sternal retractions Abd.: Soft Non-tender, ND, BS +, Body mass index is 27.18 kg/m.  Ext: No clubbing cyanosis, edema, no obvious deformities Neuro:  normal strength, MAE x 4, A&O x 3, forgetful with some visual field changes. 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 Shyrl Currently under surveillance imaging for groundglass Lung Nodules Multiple nodules in the left lower lobe, one of which has grown slightly. A new nodule (6x19mm) has also been identified. Given the patient's history of lung cancer, the growth of the nodules is concerning. -Order PET scan to further evaluate the nodules. -Follow-up within a week of the PET scan to review results. -Consider biopsy if recommended by Dr. Ruther after PET scan results.  Memory Issues and Headaches Patient has been experiencing memory issues and recent onset of severe headaches. An MRI of the brain has been scheduled to evaluate for possible causes, including metastasis of previous lung cancer or progression of previously identified ischemic small vessel disease. -Continue with scheduled MRI of the brain. -Follow-up with Dr. Rilla for MRI results and possible referral to neurology.  Peripheral Vascular Disease Home health has identified concerns about blood flow in the patient's legs. Patient has a history of vascular surgery. -Refer to primary care provider for  follow-up and possible referral to vascular surgery.  I spent 30 minutes dedicated to the care of this patient on the date of this encounter to include pre-visit review of records, face-to-face time with the patient discussing conditions above, post visit ordering of testing, clinical documentation with the electronic health record, making appropriate referrals as documented, and communicating necessary information to the patient's healthcare team.   Lauraine JULIANNA Lites, NP 11/21/2023  11:23 AM

## 2023-11-21 NOTE — Patient Instructions (Addendum)
 It is good to see you today. We will do a PET scan to better evaluate the nodule of concern . You will get a call to get this scheduled.  You will follow up with me one week after the scan to review results/ You will get a call to schedule this visit . Call if you need us  sooner.  Please contact office for sooner follow up if symptoms do not improve or worsen or seek emergency care

## 2023-11-24 ENCOUNTER — Encounter: Payer: Self-pay | Admitting: Family Medicine

## 2023-11-26 ENCOUNTER — Telehealth: Payer: Self-pay | Admitting: Family Medicine

## 2023-11-26 MED ORDER — HYDROXYZINE HCL 10 MG PO TABS: 10.0000 mg | ORAL_TABLET | Freq: Once | ORAL | 1 refills | Status: DC

## 2023-11-26 MED ORDER — HYDROXYZINE HCL 10 MG PO TABS: 10.0000 mg | ORAL_TABLET | Freq: Every day | ORAL | 0 refills | Status: DC | PRN

## 2023-11-26 NOTE — Addendum Note (Signed)
Addended by: Eustaquio Boyden on: 11/26/2023 06:08 PM   Modules accepted: Orders

## 2023-11-26 NOTE — Telephone Encounter (Signed)
Spoke with daughter.  Reviewed risks of benzo vs hydroxyzine in dementia.  She struggles with enclosed spaces. Rx hydroxyzine 10mg  1-2 tab PRN.

## 2023-11-26 NOTE — Telephone Encounter (Signed)
Copied from CRM [id number]. Topic: Clinical - Medication Question >> Nov 26, 2023  8:26 AM Isabell A wrote: Reason for CRM: Angelica Chessman (daughter) requesting medication for patient to take for MRI tomorrow due to being claustrophobic.

## 2023-11-27 ENCOUNTER — Ambulatory Visit
Admission: RE | Admit: 2023-11-27 | Discharge: 2023-11-27 | Disposition: A | Discharge status: Home / Self Care | Payer: Medicare HMO | Source: Ambulatory Visit | Attending: Family Medicine | Admitting: Family Medicine

## 2023-11-27 DIAGNOSIS — R519 Headache, unspecified: Secondary | ICD-10-CM

## 2023-11-27 DIAGNOSIS — F03B0 Unspecified dementia, moderate, without behavioral disturbance, psychotic disturbance, mood disturbance, and anxiety: Secondary | ICD-10-CM

## 2023-11-27 DIAGNOSIS — F0390 Unspecified dementia without behavioral disturbance: Secondary | ICD-10-CM | POA: Diagnosis not present

## 2023-11-27 DIAGNOSIS — R930 Abnormal findings on diagnostic imaging of skull and head, not elsewhere classified: Secondary | ICD-10-CM

## 2023-11-27 NOTE — Telephone Encounter (Signed)
Late entry - spoke with daughter last night. Rx oxybutynin 10-20mg  PRN MRI

## 2023-12-01 ENCOUNTER — Other Ambulatory Visit: Payer: Medicare HMO

## 2023-12-01 ENCOUNTER — Encounter (HOSPITAL_COMMUNITY)
Admission: RE | Admit: 2023-12-01 | Discharge: 2023-12-01 | Disposition: A | Discharge status: Home / Self Care | Payer: Medicare HMO | Source: Ambulatory Visit | Attending: Acute Care | Admitting: Acute Care

## 2023-12-01 DIAGNOSIS — R911 Solitary pulmonary nodule: Secondary | ICD-10-CM | POA: Insufficient documentation

## 2023-12-01 DIAGNOSIS — C3431 Malignant neoplasm of lower lobe, right bronchus or lung: Secondary | ICD-10-CM | POA: Diagnosis not present

## 2023-12-01 LAB — GLUCOSE, CAPILLARY: Glucose-Capillary: 84 mg/dL (ref 70–99)

## 2023-12-01 MED ORDER — FLUDEOXYGLUCOSE F - 18 (FDG) INJECTION
7.5000 | Freq: Once | INTRAVENOUS | Status: AC | PRN
  Administered 2023-12-01: 7.44 via INTRAVENOUS

## 2023-12-05 ENCOUNTER — Encounter: Payer: Self-pay | Admitting: Family Medicine

## 2023-12-05 ENCOUNTER — Ambulatory Visit (INDEPENDENT_AMBULATORY_CARE_PROVIDER_SITE_OTHER): Payer: Medicare HMO | Admitting: Family Medicine

## 2023-12-05 ENCOUNTER — Other Ambulatory Visit: Payer: Medicare HMO

## 2023-12-05 VITALS — BP 124/84 | HR 69 | Temp 98.0°F | Ht 62.0 in | Wt 146.1 lb

## 2023-12-05 DIAGNOSIS — E538 Deficiency of other specified B group vitamins: Secondary | ICD-10-CM | POA: Diagnosis not present

## 2023-12-05 DIAGNOSIS — J439 Emphysema, unspecified: Secondary | ICD-10-CM

## 2023-12-05 DIAGNOSIS — F02818 Dementia in other diseases classified elsewhere, unspecified severity, with other behavioral disturbance: Secondary | ICD-10-CM | POA: Diagnosis not present

## 2023-12-05 DIAGNOSIS — F03B0 Unspecified dementia, moderate, without behavioral disturbance, psychotic disturbance, mood disturbance, and anxiety: Secondary | ICD-10-CM | POA: Diagnosis not present

## 2023-12-05 DIAGNOSIS — K219 Gastro-esophageal reflux disease without esophagitis: Secondary | ICD-10-CM

## 2023-12-05 DIAGNOSIS — G309 Alzheimer's disease, unspecified: Secondary | ICD-10-CM | POA: Diagnosis not present

## 2023-12-05 DIAGNOSIS — R519 Headache, unspecified: Secondary | ICD-10-CM | POA: Diagnosis not present

## 2023-12-05 DIAGNOSIS — N390 Urinary tract infection, site not specified: Secondary | ICD-10-CM | POA: Diagnosis not present

## 2023-12-05 DIAGNOSIS — B9629 Other Escherichia coli [E. coli] as the cause of diseases classified elsewhere: Secondary | ICD-10-CM

## 2023-12-05 DIAGNOSIS — I739 Peripheral vascular disease, unspecified: Secondary | ICD-10-CM | POA: Diagnosis not present

## 2023-12-05 DIAGNOSIS — Z1612 Extended spectrum beta lactamase (ESBL) resistance: Secondary | ICD-10-CM

## 2023-12-05 LAB — COMPREHENSIVE METABOLIC PANEL
ALT: 14 U/L (ref 0–35)
AST: 17 U/L (ref 0–37)
Albumin: 4.1 g/dL (ref 3.5–5.2)
Alkaline Phosphatase: 59 U/L (ref 39–117)
BUN: 16 mg/dL (ref 6–23)
CO2: 31 meq/L (ref 19–32)
Calcium: 9.2 mg/dL (ref 8.4–10.5)
Chloride: 107 meq/L (ref 96–112)
Creatinine, Ser: 0.81 mg/dL (ref 0.40–1.20)
GFR: 72.33 mL/min (ref >60.00)
Glucose, Bld: 93 mg/dL (ref 70–99)
Potassium: 4.5 meq/L (ref 3.5–5.1)
Sodium: 143 meq/L (ref 135–145)
Total Bilirubin: 0.6 mg/dL (ref 0.2–1.2)
Total Protein: 6 g/dL (ref 6.0–8.3)

## 2023-12-05 LAB — CBC WITH DIFFERENTIAL/PLATELET
Basophils Absolute: 0 10*3/uL (ref 0.0–0.1)
Basophils Relative: 0.8 % (ref 0.0–3.0)
Eosinophils Absolute: 0.1 10*3/uL (ref 0.0–0.7)
Eosinophils Relative: 1.2 % (ref 0.0–5.0)
HCT: 40.4 % (ref 36.0–46.0)
Hemoglobin: 13.2 g/dL (ref 12.0–15.0)
Lymphocytes Relative: 24.6 % (ref 12.0–46.0)
Lymphs Abs: 1.4 10*3/uL (ref 0.7–4.0)
MCHC: 32.7 g/dL (ref 30.0–36.0)
MCV: 95.3 fL (ref 78.0–100.0)
Monocytes Absolute: 0.3 10*3/uL (ref 0.1–1.0)
Monocytes Relative: 5.4 % (ref 3.0–12.0)
Neutro Abs: 3.9 10*3/uL (ref 1.4–7.7)
Neutrophils Relative %: 68 % (ref 43.0–77.0)
Platelets: 212 10*3/uL (ref 150.0–400.0)
RBC: 4.24 Mil/uL (ref 3.87–5.11)
RDW: 15.3 % (ref 11.5–15.5)
WBC: 5.7 10*3/uL (ref 4.0–10.5)

## 2023-12-05 LAB — LIPID PANEL
Cholesterol: 191 mg/dL (ref 0–200)
HDL: 56.4 mg/dL (ref >39.00)
LDL Cholesterol: 114 mg/dL — ABNORMAL HIGH (ref 0–99)
NonHDL: 134.75
Total CHOL/HDL Ratio: 3
Triglycerides: 105 mg/dL (ref 0.0–149.0)
VLDL: 21 mg/dL (ref 0.0–40.0)

## 2023-12-05 LAB — URINALYSIS, ROUTINE W REFLEX MICROSCOPIC
Bilirubin Urine: NEGATIVE
Hgb urine dipstick: NEGATIVE
Ketones, ur: NEGATIVE
Nitrite: NEGATIVE
RBC / HPF: NONE SEEN (ref 0–?)
Specific Gravity, Urine: 1.01 (ref 1.000–1.030)
Total Protein, Urine: NEGATIVE
Urine Glucose: NEGATIVE
Urobilinogen, UA: 0.2 (ref 0.0–1.0)
pH: 6 (ref 5.0–8.0)

## 2023-12-05 LAB — TSH: TSH: 1.26 u[IU]/mL (ref 0.35–5.50)

## 2023-12-05 LAB — VITAMIN B12: Vitamin B-12: 359 pg/mL (ref 211–911)

## 2023-12-05 MED ORDER — RIVASTIGMINE 4.6 MG/24HR TD PT24: 4.6000 mg | MEDICATED_PATCH | Freq: Every day | TRANSDERMAL | 3 refills | Status: DC

## 2023-12-05 MED ORDER — FAMOTIDINE 20 MG PO TABS
20.0000 mg | ORAL_TABLET | Freq: Every day | ORAL | Status: AC | PRN
Start: 2023-12-05 — End: ?

## 2023-12-05 NOTE — Progress Notes (Addendum)
 Ph: [phone] Fax: [phone]   Patient ID: Lindsay Mason, female    DOB: 06-Jan-1951, 73 y.o.   MRN: [id number]  This visit was conducted in person.  BP 124/84   Pulse 69   Temp 98 F (36.7 C) (Oral)   Ht 5\' 2"  (1.575 m)   Wt 146 lb 2 oz (66.3 kg)   SpO2 94%   BMI 26.73 kg/m    CC: discuss MRI results  Subjective:   HPI: Lindsay Mason is a 73 y.o. female presenting on 12/05/2023 for Results (Wants to discuss MRI results. Pt accompanied by husband, Dorinda Hill and daughter, Angelica Chessman. )   Husband just getting over stomach flu.  Brain MRI completed last week, results reviewed as per below.  Claustrophobia with MRI treated with hydroxyzine 10-20mg  PRN - did well with this.   She's also recently completed PET scan for f/u of possible new adenocarcinoma to L lung (in h/o R lung adenocarcinoma s/p lobectomy 2021). Results and pulm f/u pending.   Dementia - did not tolerate donepezil 10mg  dose due to nausea/vomiting and headaches so dropped to 5mg  - last visit we held 5mg  donepezil given concern it may have contributed to worsening reflux and headaches - no change with holding this.  Pending [facility] neurology evaluation (02/2024 - [facility]).   Initially treated with bactrim bid abx course for UTI with UCx growing >100k ESBL E coli resistant to bactrim - this was changed to macrobid 7d course, completed early 11/2023. Urinary symptoms are improved. Rpt UA today without significant symptoms.   Continued headaches - taking tylenol twice daily pretty regularly. No significant caffeine use. No vision changes, nausea/vomiting, noted photo/phonophobia or neurological changes. No sinus pressure component to headache.   Recently found to have significant PAD on house calls screening arterial circulation evaluation (0.18 on left, 0.42 right)  Denies claudication or rest pain. She traditionally has walked well, but has slowed down recently.  She had vein surgery (stripping) to ankles 1990s.       Relevant past medical, surgical, family and social history reviewed and updated as indicated. Interim medical history since our last visit reviewed. Allergies and medications reviewed and updated. Outpatient Medications Prior to Visit  Medication Sig Dispense Refill   albuterol (VENTOLIN HFA) 108 (90 Base) MCG/ACT inhaler Inhale 2 puffs into the lungs every 6 (six) hours as needed for wheezing or shortness of breath. 8 g 6   cyanocobalamin (VITAMIN B12) 1000 MCG tablet Take 1 tablet (1,000 mcg total) by mouth daily. 90 tablet 3   Multiple Vitamin (MULTIVITAMIN) tablet Take 1 tablet by mouth daily.     donepezil (ARICEPT) 5 MG tablet Take 1 tablet (5 mg total) by mouth at bedtime. for memory (Patient not taking: Reported on 12/05/2023) 90 tablet 1   hydrOXYzine (ATARAX) 10 MG tablet Take 1-2 tablets (10-20 mg total) by mouth daily as needed for anxiety (MRI). 4 tablet 0   No facility-administered medications prior to visit.     Per HPI unless specifically indicated in ROS section below Review of Systems  Objective:  BP 124/84   Pulse 69   Temp 98 F (36.7 C) (Oral)   Ht 5\' 2"  (1.575 m)   Wt 146 lb 2 oz (66.3 kg)   SpO2 94%   BMI 26.73 kg/m   Wt Readings from Last 3 Encounters:  12/05/23 146 lb 2 oz (66.3 kg)  11/21/23 148 lb 9.6 oz (67.4 kg)  11/05/23 148 lb 2 oz (67.2 kg)  Physical Exam Vitals and nursing note reviewed.  Constitutional:      Appearance: Normal appearance. She is not ill-appearing.  HENT:     Mouth/Throat:     Mouth: Mucous membranes are moist.     Pharynx: Oropharynx is clear. No oropharyngeal exudate or posterior oropharyngeal erythema.  Cardiovascular:     Rate and Rhythm: Normal rate and regular rhythm.     Pulses: Normal pulses.  Pulmonary:     Effort: Pulmonary effort is normal.     Breath sounds: Rhonchi present. No wheezing or rales.     Comments: Coarse breath sounds throughout Musculoskeletal:     Comments:  2+ DP on right 1+ DP on  left  Skin:    General: Skin is warm and dry.     Capillary Refill: Capillary refill takes 2 to 3 seconds. To bilateral feet    Findings: No erythema or rash.  Neurological:     Mental Status: She is alert.  Psychiatric:        Mood and Affect: Mood normal.        Behavior: Behavior normal.       Results for orders placed or performed in visit on 12/05/23  Lipid panel   Collection Time: 12/05/23 12:48 PM  Result Value Ref Range   Cholesterol 191 0 - 200 mg/dL   Triglycerides 161.0 0.0 - 149.0 mg/dL   HDL 96.04 >54.09 mg/dL   VLDL 81.1 0.0 - 91.4 mg/dL   LDL Cholesterol 782 (H) 0 - 99 mg/dL   Total CHOL/HDL Ratio 3    NonHDL 134.75   Comprehensive metabolic panel   Collection Time: 12/05/23 12:48 PM  Result Value Ref Range   Sodium 143 135 - 145 mEq/L   Potassium 4.5 3.5 - 5.1 mEq/L   Chloride 107 96 - 112 mEq/L   CO2 31 19 - 32 mEq/L   Glucose, Bld 93 70 - 99 mg/dL   BUN 16 6 - 23 mg/dL   Creatinine, Ser 9.56 0.40 - 1.20 mg/dL   Total Bilirubin 0.6 0.2 - 1.2 mg/dL   Alkaline Phosphatase 59 39 - 117 U/L   AST 17 0 - 37 U/L   ALT 14 0 - 35 U/L   Total Protein 6.0 6.0 - 8.3 g/dL   Albumin 4.1 3.5 - 5.2 g/dL   GFR 21.30 >86.57 mL/min   Calcium 9.2 8.4 - 10.5 mg/dL  TSH   Collection Time: 12/05/23 12:48 PM  Result Value Ref Range   TSH 1.26 0.35 - 5.50 uIU/mL  CBC with Differential/Platelet   Collection Time: 12/05/23 12:48 PM  Result Value Ref Range   WBC 5.7 4.0 - 10.5 K/uL   RBC 4.24 3.87 - 5.11 Mil/uL   Hemoglobin 13.2 12.0 - 15.0 g/dL   HCT 84.6 96.2 - 95.2 %   MCV 95.3 78.0 - 100.0 fl   MCHC 32.7 30.0 - 36.0 g/dL   RDW 84.1 32.4 - 40.1 %   Platelets 212.0 150.0 - 400.0 K/uL   Neutrophils Relative % 68.0 43.0 - 77.0 %   Lymphocytes Relative 24.6 12.0 - 46.0 %   Monocytes Relative 5.4 3.0 - 12.0 %   Eosinophils Relative 1.2 0.0 - 5.0 %   Basophils Relative 0.8 0.0 - 3.0 %   Neutro Abs 3.9 1.4 - 7.7 K/uL   Lymphs Abs 1.4 0.7 - 4.0 K/uL   Monocytes  Absolute 0.3 0.1 - 1.0 K/uL   Eosinophils Absolute 0.1 0.0 - 0.7 K/uL  Basophils Absolute 0.0 0.0 - 0.1 K/uL  Vitamin B12   Collection Time: 12/05/23 12:48 PM  Result Value Ref Range   Vitamin B-12 359 211 - 911 pg/mL   Lab Results  Component Value Date   CHOL 191 12/05/2023   HDL 56.40 12/05/2023   LDLCALC 114 (H) 12/05/2023   TRIG 105.0 12/05/2023   CHOLHDL 3 12/05/2023   Lab Results  Component Value Date   VITAMINB12 359 12/05/2023    MR Brain Wo Contrast CLINICAL DATA:  Dementia, vascular etiology suspected.  EXAM: MRI HEAD WITHOUT CONTRAST  TECHNIQUE: Multiplanar, multiecho pulse sequences of the brain and surrounding structures were obtained without intravenous contrast.  COMPARISON:  MRI brain 08/11/2011.  Head CT 02/05/2021.  FINDINGS: Brain: No acute infarct or hemorrhage. 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 No hydrocephalus or extra-axial collection. No mass or abnormal susceptibility. Generalized volume loss with slight temporoparietal predominance and bilateral hippocampal atrophy.  Vascular: Normal flow voids.  Skull and upper cervical spine: Normal marrow signal.  Sinuses/Orbits: Mild mucosal disease in the right maxillary sinus. Orbits are unremarkable.  Other: None.  IMPRESSION: 1. No acute intracranial abnormality or mass. 2. Generalized volume loss with slight temporoparietal predominance and bilateral hippocampal atrophy. 3. 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  Electronically Signed   By: Orvan Falconer M.D.   On: 12/05/2023 12:18   Assessment & Plan:   Problem List Items Addressed This Visit     Headache   Ongoing, of unclear cause. Not consistent with migraines or sinus headache.  Largely reassuring brain MRI Regularly taking tylenol 500mg  BID - possible MOH - discussed this Suggested  transition off tylenol, however limited in alternative treatments due to cognitive issues (would avoid flexeril, TCA).  Consider Excedrin migraine 1-2 tab once daily for max 2d/wk while stopping tylenol.       Relevant Orders   Comprehensive metabolic panel (Completed)   TSH (Completed)   CBC with Differential/Platelet (Completed)   COPD with emphysema (HCC)   Stable overall asxs off respiratory medication.       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 Imaging most consistent with alzheimer's disease - predominant hippocampal and temporoparietal atrophy, also has chronic small vessel disease.  May not be tolerating oral donepezil - headache, reflux. Will price out Excelon patch. Discussed starting 4.6mg  dose patch for 1 month and if tolerated well increase to 9.5mg  dose. Will likely recommend trial Namenda as well pending effect of above.  Continue b12 replacement.  Pending neurology eval 02/2024.      Relevant Medications   rivastigmine (EXELON) 4.6 mg/24hr   Vitamin B12 deficiency   Continue oral b12 daily.       Relevant Orders   Vitamin B12 (Completed)   Acid reflux   Recommend try PRN OTC pepcid 20mg  .       Relevant Medications   famotidine (PEPCID) 20 MG tablet   PAD (peripheral artery disease) (HCC)   Incidentally noted on house calls screening using volume plethysmography: severe L 0.18, moderate R 0.42.  Discussed this with pt and family - as well as options including rpt ABI. Hwoever she does not have symptoms of claudication, no rest pain, and she does have palpable pulses although diminished on the left. Given dementia and signs of hypoperfusion risk, not likely candidate for invasive interventions. Will continue to monitor at this time.  Reasonable to start statin given this finding and cerebral ischemic changes - will trial atorvastatin 20mg , watching for myalgia.  Did not start aspirin at this  time.       Relevant Orders   Lipid panel (Completed)   UTI due to extended-spectrum beta lactamase (ESBL) producing Escherichia coli   Rpt UA today - no significant signs of ongoing infection         Meds ordered this encounter  Medications   famotidine (PEPCID) 20 MG tablet    Sig: Take 1 tablet (20 mg total) by mouth daily as needed for heartburn or indigestion.   rivastigmine (EXELON) 4.6 mg/24hr    Sig: Place 1 patch (4.6 mg total) onto the skin daily.    Dispense:  30 patch    Refill:  3    To replace donepezil    Orders Placed This Encounter  Procedures   Lipid panel   Comprehensive metabolic panel   TSH   CBC with Differential/Platelet   Vitamin B12    Patient Instructions  Labs today  Take pepcid 20mg  daily as needed for reflux.  Start taking atorvastatin 20mg  daily. Watch for muscle aches. If this happens may drop dose to 2-3 times a week.  Price out excelon patches for memory (to replace donepezil (Aricept)) Keep follow up visit in 3 weeks   Follow up plan: No follow-ups on file.  Eustaquio Boyden, MD

## 2023-12-05 NOTE — Patient Instructions (Addendum)
 Labs today  Take pepcid 20mg  daily as needed for reflux.  Start taking atorvastatin 20mg  daily. Watch for muscle aches. If this happens may drop dose to 2-3 times a week.  Price out excelon patches for memory (to replace donepezil (Aricept)) Keep follow up visit in 3 weeks

## 2023-12-06 DIAGNOSIS — N390 Urinary tract infection, site not specified: Secondary | ICD-10-CM | POA: Insufficient documentation

## 2023-12-06 DIAGNOSIS — I739 Peripheral vascular disease, unspecified: Secondary | ICD-10-CM | POA: Insufficient documentation

## 2023-12-06 DIAGNOSIS — B9629 Other Escherichia coli [E. coli] as the cause of diseases classified elsewhere: Secondary | ICD-10-CM | POA: Insufficient documentation

## 2023-12-06 NOTE — Assessment & Plan Note (Signed)
 Stable overall asxs off respiratory medication.

## 2023-12-06 NOTE — Assessment & Plan Note (Signed)
 Continue oral b12 daily.

## 2023-12-06 NOTE — Assessment & Plan Note (Addendum)
 Rpt UA today - no significant signs of ongoing infection

## 2023-12-06 NOTE — Assessment & Plan Note (Addendum)
 Ongoing, of unclear cause. Not consistent with migraines or sinus headache.  Largely reassuring brain MRI Regularly taking tylenol 500mg  BID - possible MOH - discussed this Suggested transition off tylenol, however limited in alternative treatments due to cognitive issues (would avoid flexeril, TCA).  Consider Excedrin migraine 1-2 tab once daily for max 2d/wk while stopping tylenol.

## 2023-12-06 NOTE — Assessment & Plan Note (Signed)
 Recommend try PRN OTC pepcid 20mg  .

## 2023-12-06 NOTE — Assessment & Plan Note (Addendum)
 Incidentally noted on house calls screening using volume plethysmography: severe L 0.18, moderate R 0.42.  Discussed this with pt and family - as well as options including rpt ABI. Hwoever she does not have symptoms of claudication, no rest pain, and she does have palpable pulses although diminished on the left. Given dementia and signs of hypoperfusion risk, not likely candidate for invasive interventions. Will continue to monitor at this time.  Reasonable to start statin given this finding and cerebral ischemic changes - will trial atorvastatin 20mg , watching for myalgia.  Did not start aspirin at this time.

## 2023-12-06 NOTE — Assessment & Plan Note (Addendum)
 Progressive with some sundowning. Imaging most consistent with alzheimer's disease - predominant hippocampal and temporoparietal atrophy, also has chronic small vessel disease.  May not be tolerating oral donepezil - headache, reflux. Will price out Excelon patch. Discussed starting 4.6mg  dose patch for 1 month and if tolerated well increase to 9.5mg  dose. Will likely recommend trial Namenda as well pending effect of above.  Continue b12 replacement.  Pending neurology eval 02/2024.

## 2023-12-08 ENCOUNTER — Encounter: Payer: Self-pay | Admitting: Family Medicine

## 2023-12-10 MED ORDER — ATORVASTATIN CALCIUM 20 MG PO TABS: 20.0000 mg | ORAL_TABLET | Freq: Every day | ORAL | 11 refills | Status: DC

## 2023-12-24 ENCOUNTER — Ambulatory Visit: Payer: Medicare HMO | Admitting: Family Medicine

## 2023-12-26 ENCOUNTER — Telehealth: Payer: Self-pay | Admitting: Acute Care

## 2023-12-26 ENCOUNTER — Encounter: Payer: Self-pay | Admitting: Acute Care

## 2023-12-26 ENCOUNTER — Ambulatory Visit (INDEPENDENT_AMBULATORY_CARE_PROVIDER_SITE_OTHER): Admitting: Family Medicine

## 2023-12-26 ENCOUNTER — Ambulatory Visit: Payer: Medicare HMO | Admitting: Acute Care

## 2023-12-26 ENCOUNTER — Encounter: Payer: Self-pay | Admitting: Family Medicine

## 2023-12-26 VITALS — BP 143/88 | HR 74 | Ht 63.0 in | Wt 142.8 lb

## 2023-12-26 VITALS — BP 130/84 | HR 73 | Temp 98.1°F | Ht 62.0 in | Wt 143.4 lb

## 2023-12-26 DIAGNOSIS — E538 Deficiency of other specified B group vitamins: Secondary | ICD-10-CM | POA: Diagnosis not present

## 2023-12-26 DIAGNOSIS — G309 Alzheimer's disease, unspecified: Secondary | ICD-10-CM

## 2023-12-26 DIAGNOSIS — I739 Peripheral vascular disease, unspecified: Secondary | ICD-10-CM | POA: Diagnosis not present

## 2023-12-26 DIAGNOSIS — R911 Solitary pulmonary nodule: Secondary | ICD-10-CM

## 2023-12-26 DIAGNOSIS — Z902 Acquired absence of lung [part of]: Secondary | ICD-10-CM

## 2023-12-26 DIAGNOSIS — F02818 Dementia in other diseases classified elsewhere, unspecified severity, with other behavioral disturbance: Secondary | ICD-10-CM

## 2023-12-26 DIAGNOSIS — C3480 Malignant neoplasm of overlapping sites of unspecified bronchus and lung: Secondary | ICD-10-CM | POA: Diagnosis not present

## 2023-12-26 DIAGNOSIS — R519 Headache, unspecified: Secondary | ICD-10-CM

## 2023-12-26 DIAGNOSIS — Z87891 Personal history of nicotine dependence: Secondary | ICD-10-CM

## 2023-12-26 DIAGNOSIS — K219 Gastro-esophageal reflux disease without esophagitis: Secondary | ICD-10-CM

## 2023-12-26 MED ORDER — RIVASTIGMINE 9.5 MG/24HR TD PT24: 9.5000 mg | MEDICATED_PATCH | Freq: Every day | TRANSDERMAL | 3 refills | Status: DC

## 2023-12-26 NOTE — Patient Instructions (Addendum)
 Since we've done well with Excelon patch at 4.6mg , next dose will be 9.5mg  patch/day sent to pharmacy - price out and let me know if unaffordable.  I will await plan for lung spot on left Keep neurology appointment in May.  Good to see you today  Return in 4 months after 05/04/2024 for physical/wellness visit

## 2023-12-26 NOTE — Assessment & Plan Note (Signed)
 Continue oral b12 daily.

## 2023-12-26 NOTE — Assessment & Plan Note (Signed)
 Appreciate pulmonology care - discussing tissue sampling vs radiation treatment of suspicious LLL nodule.

## 2023-12-26 NOTE — Progress Notes (Addendum)
 History of Present Illness TIFFNEY HAUGHTON is a 73 y.o. female former smoker quit 2016 with a 36-pack-year smoking history referred to Dr. Tonia Brooms in November 2022 for abnormal lung nodule by Eustaquio Boyden, MD. She has past medical history of moderate COPD, centrilobular emphysema, microscopic colitis, community-acquired pneumonia , abnormal pulmonary nodules, and right lower lobe lobectomy for suspected lung cancer ( [place]) with tissue diagnosis of adenocarcinoma . She also has some mild dementia and memory loss.   Synopsis 73 year old female, past medical history of moderate COPD, centrilobular emphysema, microscopic colitis, community-acquired pneumonia in 2013. In 2021 patient had a slowly changing some solid groundglass lesion within the right lower lobe and underwent a navigational bronchoscopy with dye marking and single anesthetic event for follow-up lobectomy by Dr. Cliffton Asters. This was diagnosed as a adenocarcinoma. She has had subsequent follow-up images. Serial imaging has showed 3 groundglass lesions within the left lower lobe. Lesion in the left lower lobe has showed some changes and progression to a larger size in comparison to imaging dating all the way back to 2017.   Follow-up CT scan completed beginning of October 2023 showed stability of the left lower lobe groundglass nodule, and plan was for continued surveillance imaging . Ct chest done 10/24/2023 shows the left lower lobe mass more prominent than previous CT imaging. Plan was for a PET scan and follow up to determine need for bronchoscopy with biopsy based on PET results. PET scan shows SUV of 2.5, which is identical to the SUV on PET of the RLL adenocarcinoma.     12/26/2023 Pt. Presents for follow up.after PET. PET scan was read showing the  left lower lobe ground-glass nodule on recent CT demonstrates low-level, nonspecific activity. This is identical to the SUV of 2.5 noted on the adenocarcinoma of the Right Lower  Lobe.  This remains suspicious for low-grade adenocarcinoma.  There was no hypermetabolic metastatic disease noted on PET.  Patient has dementia.  I will reach out to radiation oncology to see if they would consider treatment with SBRT to this left lower lobe site based on the fact she had an adeno of the right lower lobe, that this nodule has shown growth over time in the left lower lobe, and has an identical SUV read on PET as her previous cancer.  I would prefer not to put the patient under general anesthesia for repeat bronchoscopy with biopsy unless absolutely necessary.  The family are actually in agreement with this plan.  They do understand that radiation oncology may not feel comfortable with this and may still require biopsy but agree that it is worth asking the question.  The patient has no respiratory issues.  She is a former smoker and quit in 2016.  I will reach out to Dr. [name] at radiation oncology to see if he would consider treatment with SBRT to this new lesion without tissue sampling.  I have called the patient's daughter and discussed this with her.  The family as a whole would prefer SBRT without the need to biopsy however they understand biopsy may still be necessary based on consultation with radiation oncology regarding her individual case.  Patient denies any hemoptysis, or unintentional weight loss.  Test Results: PET Scan 12/01/2023 The subpleural left lower lobe ground-glass nodule demonstrates low-level activity, a S.U.V. max of 2.5 .The left lower lobe ground-glass nodule on recent CT demonstrates low-level, nonspecific activity. As adenocarcinomas are typically not significantly hypermetabolic, this remains suspicious for low-grade adenocarcinoma. 2. No  hypermetabolic metastatic disease. 3. Similar appearance and morphology of a right pelvic cystic lesion of maximally 4.5 cm. Not hypermetabolic and likely benign/incidental. Considerations include peritoneal  inclusion cyst or hydrosalpinx. 4. Left adrenal adenoma . In the absence of clinically indicated signs/symptoms require(s) no independent follow-up. 5. Incidental findings, including: Aortic atherosclerosis (ICD10-I70.0) and emphysema (ICD10-J43.9). Sinus disease.  Brain MRI 11/27/2023 No acute intracranial abnormality or mass. 2. Generalized volume loss with slight temporoparietal predominance and bilateral hippocampal atrophy. 3. 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  CT chest 10/24/2023 Somewhat amorphous but irregular ground-glass nodule in the left lower lobe, stable from 01/11/2022 but slightly more prominent than on 12/29/2020. Low-grade adenocarcinoma can have this appearance. Recommend annual follow-up CT chest without contrast. 2. 5 mm subpleural left lower lobe nodule, stable. Recommend continued attention on follow-up. 3. Left adrenal adenoma. 4.  Aortic atherosclerosis (ICD10-I70.0). 5.  Emphysema (ICD10-J43.9).  08/08/2023 CT Chest  New spiculated subpleural left lower lobe nodule measuring 6 x 6 mm. This is nonspecific, however may represent small bronchogenic neoplasm. Consider multi disciplinary assessment. If tissue sampling is not pursued, short follow-up CT is recommended in 3 months. 2. Stable appearance of ill-defined ground-glass nodularity in the left lower lobe measuring 15 x 11 mm from most recent prior, but slowly enlarging from more remote exams. Attention at follow-up recommended. 3. Postsurgical change in the right hemithorax.  07/19/2022 CT Chest Stable postoperative chest CT status post right lower lobectomy. 2. Stable ill-defined ground-glass densities posteriorly in the left lower lobe without enlarging solid components. Based on changes from older prior studies, continued surveillance at 6-12 month intervals suggested. 3. No evidence of metastatic disease. 4. Stable left  adrenal hyperplasia or small adenoma. 5. Aortic Atherosclerosis (ICD10-I70.0) and Emphysema (ICD10-J43.9).   Chest Imaging: September 2022 CT chest: Left lower lobe groundglass lesions.  Slowly enlarging since 2017.     October 2023 CT chest: Stable left lower lobe groundglass lesion    Latest Ref Rng & Units 12/05/2023   12:48 PM 01/19/2021    1:10 PM 01/22/2020    2:26 AM  CBC  WBC 4.0 - 10.5 K/uL 5.7  5.2  6.7   Hemoglobin 12.0 - 15.0 g/dL 16.1  09.6  04.5   Hematocrit 36.0 - 46.0 % 40.4  40.2  36.4   Platelets 150.0 - 400.0 K/uL 212.0  244.0  185        Latest Ref Rng & Units 12/05/2023   12:48 PM 04/28/2023    2:00 PM 04/24/2022    9:59 AM  BMP  Glucose 70 - 99 mg/dL 93  409  90   BUN 6 - 23 mg/dL 16  17  17    Creatinine 0.40 - 1.20 mg/dL 8.11  9.14  7.82   Sodium 135 - 145 mEq/L 143  142  140   Potassium 3.5 - 5.1 mEq/L 4.5  4.4  4.6   Chloride 96 - 112 mEq/L 107  105  104   CO2 19 - 32 mEq/L 31  27  31    Calcium 8.4 - 10.5 mg/dL 9.2  95.6  9.3     BNP No results found for: "BNP"  ProBNP No results found for: "PROBNP"  PFT    Component Value Date/Time   FEV1PRE 1.38 08/02/2022 1145   FEV1POST 1.74 08/02/2022 1145   FVCPRE 2.23 08/02/2022 1145   FVCPOST 2.48 08/02/2022 1145   TLC 4.78 08/02/2022 1145   DLCOUNC 17.67 01/03/2020 1444  PREFEV1FVCRT 62 08/02/2022 1145   PSTFEV1FVCRT 70 08/02/2022 1145    NM PET Image Initial (PI) Skull Base To Thigh Result Date: 12/16/2023 CLINICAL DATA:  Initial treatment strategy for enlarging ground-glass nodule in the left lower lobe on chest CT. Prior right lower lobe wedge resection for carcinoma. EXAM: NUCLEAR MEDICINE PET SKULL BASE TO THIGH TECHNIQUE: 7.4 mCi F-18 FDG was injected intravenously. Full-ring PET imaging was performed from the skull base to thigh after the radiotracer. CT data was obtained and used for attenuation correction and anatomic localization. Fasting blood glucose: 84 mg/dl COMPARISON:  Chest CTs  including 10/24/2023. Remote PET of 01/03/2020. FINDINGS: Mediastinal blood pool activity: SUV max 2.5 Liver activity: SUV max NA NECK: No areas of abnormal hypermetabolism. Incidental CT findings: No cervical adenopathy. Left carotid atherosclerosis. Right maxillary sinus mucosal thickening. CHEST: No thoracic nodal hypermetabolism. The subpleural left lower lobe ground-glass nodule demonstrates low-level activity, a S.U.V. max of 2.5 including on 73/4. Incidental CT findings: Mild cardiomegaly.  Centrilobular emphysema. ABDOMEN/PELVIS: No abdominopelvic parenchymal or nodal hypermetabolism. Incidental CT findings: Left greater than right adrenal thickening. Mild left adrenal nodularity. 1.4 cm and 7.5 HU, most consistent with an adenoma. Lower pole left renal sinus cyst of 1.4 cm does not warrant specific imaging follow-up. Subcentimeter high right hepatic lobe cyst. Abdominal aortic atherosclerosis. Partial hysterectomy or uterine atrophy. A bilobed right pelvic cystic lesion measures 4.5 x 2.3 cm including on 157/4. This was similar in size and morphology on 01/03/2020 PET, and not tracer avid, strongly favoring a benign etiology. Pelvic floor laxity. SKELETON: Hypermetabolism within the left iliacus tendon and less so left quadriceps musculature may represent strain and/or motion after radiopharmaceutical injection. No abnormal marrow activity. Incidental CT findings: Left parasymphyseal pubic bone remote fracture/fractures. Osteopenia. Old bilateral rib fractures. IMPRESSION: 1. The left lower lobe ground-glass nodule on recent CT demonstrates low-level, nonspecific activity. As adenocarcinomas are typically not significantly hypermetabolic, this remains suspicious for low-grade adenocarcinoma. 2. No hypermetabolic metastatic disease. 3. Similar appearance and morphology of a right pelvic cystic lesion of maximally 4.5 cm. Not hypermetabolic and likely benign/incidental. Considerations include peritoneal  inclusion cyst or hydrosalpinx. 4. Left adrenal adenoma . In the absence of clinically indicated signs/symptoms require(s) no independent follow-up. 5. Incidental findings, including: Aortic atherosclerosis (ICD10-I70.0) and emphysema (ICD10-J43.9). Sinus disease. Electronically Signed   By: Jeronimo Greaves M.D.   On: 12/16/2023 11:55   MR Brain Wo Contrast Result Date: 12/05/2023 CLINICAL DATA:  Dementia, vascular etiology suspected. EXAM: MRI HEAD WITHOUT CONTRAST TECHNIQUE: Multiplanar, multiecho pulse sequences of the brain and surrounding structures were obtained without intravenous contrast. COMPARISON:  MRI brain 08/11/2011.  Head CT 02/05/2021. FINDINGS: Brain: No acute infarct or hemorrhage. 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 No hydrocephalus or extra-axial collection. No mass or abnormal susceptibility. Generalized volume loss with slight temporoparietal predominance and bilateral hippocampal atrophy. Vascular: Normal flow voids. Skull and upper cervical spine: Normal marrow signal. Sinuses/Orbits: Mild mucosal disease in the right maxillary sinus. Orbits are unremarkable. Other: None. IMPRESSION: 1. No acute intracranial abnormality or mass. 2. Generalized volume loss with slight temporoparietal predominance and bilateral hippocampal atrophy. 3. 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 Electronically Signed   By: Orvan Falconer M.D.   On: 12/05/2023 12:18     Past medical hx Past Medical History:  Diagnosis Date   Adrenal hyperplasia (HCC) 12/2011   on CT   Allergy  CAP (community acquired pneumonia) 12/2011   Chest pain    normal ETT 10/2011   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   by CT - moderate centrilobular and paraseptal   Ex-smoker 08/2014   currently using e cig   Microscopic colitis 06/2015   lymphocytic by colonoscopy - started entocort Christella Hartigan)    Osteoporosis, unspecified    Scoliosis of lumbar spine    Thoracic aortic atherosclerosis (HCC) 02/2016   by CT     Social History   Tobacco Use   Smoking status: Former    Current packs/day: 0.00    Average packs/day: 1 pack/day for 36.7 years (36.7 ttl pk-yrs)    Types: Cigarettes    Start date: 10/14/1978    Quit date: 07/15/2015    Years since quitting: 8.4   Smokeless tobacco: Never   Tobacco comments:    off and on smoker, now e-cigarettes  Vaping Use   Vaping status: Some Days   Devices: uses occasionally - no nicotine  Substance Use Topics   Alcohol use: Yes    Alcohol/week: 0.0 standard drinks of alcohol    Comment: social wine once a week   Drug use: No    Ms.Lopez reports that she quit smoking about 8 years ago. Her smoking use included cigarettes. She started smoking about 45 years ago. She has a 36.7 pack-year smoking history. She has never used smokeless tobacco. She reports current alcohol use. She reports that she does not use drugs.  Tobacco Cessation: Counseling given: Not Answered Tobacco comments: off and on smoker, now e-cigarettes Former smoker quit in 2016 with a 36.7-pack-year smoking history.  Past surgical hx, Family hx, Social hx all reviewed.  Current Outpatient Medications on File Prior to Visit  Medication Sig   albuterol (VENTOLIN HFA) 108 (90 Base) MCG/ACT inhaler Inhale 2 puffs into the lungs every 6 (six) hours as needed for wheezing or shortness of breath.   atorvastatin (LIPITOR) 20 MG tablet Take 1 tablet (20 mg total) by mouth daily.   cyanocobalamin (VITAMIN B12) 1000 MCG tablet Take 1 tablet (1,000 mcg total) by mouth daily.   famotidine (PEPCID) 20 MG tablet Take 1 tablet (20 mg total) by mouth daily as needed for heartburn or indigestion.   Multiple Vitamin (MULTIVITAMIN) tablet Take 1 tablet by mouth daily.   rivastigmine (EXELON) 4.6 mg/24hr Place 1 patch (4.6 mg total) onto the skin daily.   No current facility-administered  medications on file prior to visit.     Allergies  Allergen Reactions   Penicillins Swelling    REACTION: swelling tongue - States has taken Cephalosporins without difficulty in past Did it involve swelling of the face/tongue/throat, SOB, or low BP? Yes Did it involve sudden or severe rash/hives, skin peeling, or any reaction on the inside of your mouth or nose? No Did you need to seek medical attention at a hospital or doctor's office? Yes When did it last happen? Childhood reaction If all above answers are "NO", may proceed with cephalosporin use.     Review Of Systems:  Constitutional:   No  weight loss, night sweats,  Fevers, chills, fatigue, or  lassitude.  HEENT:   No headaches,  Difficulty swallowing,  Tooth/dental problems, or  Sore throat,                No sneezing, itching, ear ache, nasal congestion, post nasal drip,   CV:  No chest pain,  Orthopnea, PND, swelling in lower extremities, anasarca, dizziness, palpitations, syncope.  GI  No heartburn, indigestion, abdominal pain, nausea, vomiting, diarrhea, change in bowel habits, loss of appetite, bloody stools.   Resp: No shortness of breath with exertion or at rest.  No excess mucus, no productive cough,  No non-productive cough,  No coughing up of blood.  No change in color of mucus.  No wheezing.  No chest wall deformity  Skin: no rash or lesions.  GU: no dysuria, change in color of urine, no urgency or frequency.  No flank pain, no hematuria   MS:  No joint pain or swelling.  No decreased range of motion.  No back pain.  Psych:  No change in mood or affect. No depression or anxiety.  + memory loss, patient has dementia.   Vital Signs BP (!) 143/88 (BP Location: Left Arm, Cuff Size: Normal)   Pulse 74   SpO2 96%    Physical Exam:  General- No distress,  A&Ox3, pleasant and forgetful ENT: No sinus tenderness, TM clear, pale nasal mucosa, no oral exudate,no post nasal drip, no LAN Cardiac: S1, S2, regular  rate and rhythm, no murmur Chest: No wheeze/ rales/ dullness; no accessory muscle use, no nasal flaring, no sternal retractions Abd.: Soft Non-tender, nondistended, bowel sounds positive,Body mass index is 25.3 kg/m.  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 Cliffton Asters Currently under surveillance imaging for groundglass Lung Nodules Multiple nodules in the left lower lobe, one of which has grown slightly. A new nodule (6x17mm) has also been identified. Given the patient's history of lung cancer, the growth of the nodules is concerning.  Nodule has low SUV of 2.5 on pet imaging which is consistent with her previous cancer and adenocarcinoma Plan In order to avoid general anesthesia and additional biopsy will reach out to radiation oncology to see if they would consider treatment with SBRT without tissue sampling. Once I hear back from radiation oncology I will let the family know If radiation oncology is in agreement with treatment without tissue sampling will make referral to radiation oncology If radiation oncology feel they need tissue sampling before they can treat we will schedule bronchoscopy with biopsies Call if you need [place] sooner Please contact office for sooner follow up if symptoms do not improve or worsen or seek emergency care    I spent 45 minutes dedicated to the care of this patient on the date of this encounter to include pre-visit review of records, face-to-face time with the patient discussing conditions above, post visit ordering of testing, clinical documentation with the electronic health record, making appropriate referrals as documented, and communicating necessary  information to the patient's healthcare team.     Bevelyn Ngo, NP 12/26/2023  8:40 AM

## 2023-12-26 NOTE — Assessment & Plan Note (Signed)
 This is better with OTC pepcid use.

## 2023-12-26 NOTE — Patient Instructions (Addendum)
 It is good to see you today. The PET scan shows low level hypermetabolic activity in the left lower lobe nodule that is consistent with adenocarcinoma. We need to discuss if we need to do another biopsy vs seeing if radiation oncology will consider radiation without tissue sampling. I will speak with Dr. Delton Coombes, and call you after to let you know what he feels is the  best option. We will then refer her to radiation oncology , or schedule a biopsy. Please contact office for sooner follow up if symptoms do not improve or worsen or seek emergency care

## 2023-12-26 NOTE — Assessment & Plan Note (Signed)
 By house calls screening test Tolerating atorvastatin 20mg  well started 11/2023 - continue.

## 2023-12-26 NOTE — Assessment & Plan Note (Addendum)
 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  Did not tolerate oral donepezil.  Doing better with Excelon patch 4.6mg  daily, some concern for longterm affordability.  Will increase Excelon patch to 9.5mg /day dose next fill.  Keep neurology evaluation 02/2024.

## 2023-12-26 NOTE — Assessment & Plan Note (Signed)
 This is better - either off donepezil (med side effect), or with less tylenol use [facility]).

## 2023-12-26 NOTE — Telephone Encounter (Signed)
 Lindsay Mason had right lower lobe adeno, post RLL lobectomy in 2022. by Dr. Cliffton Asters Surveillance imaging showed new LLL nodule that has the same SUV on PET as the previous right sided nodule ( 2.5) . She has dementia, and I was wondering if we needed to put her through another biopsy vs seeing if you all at [facility] oncology would consider treatment without tissue confirmation based on previous RLL cancer. I know it is opposite side and could be a separate primary, but I thought it was worth asking the question. General anesthesia in dementia can increase progression. If you think we need to biopsy, I will reschedule, but again, I thought it was worth asking the question to see if radiation oncology was a possibility before scheduling another biopsy. Please let me know what you think, and I will either place referral to radiation oncology, or schedule a bronch. Thank You !

## 2023-12-26 NOTE — Progress Notes (Signed)
 Ph: [phone] Fax: [phone]   Patient ID: Lindsay Mason, female    DOB: [birth year], 73 y.o.   MRN: [id number]  This visit was conducted in person.  BP 130/84   Pulse 73   Temp 98.1 F (36.7 C) (Oral)   Ht 5\' 2"  (1.575 m)   Wt 143 lb 6 oz (65 kg)   SpO2 95%   BMI 26.22 kg/m    CC: f/u visit  Subjective:   HPI: Lindsay Mason is a 73 y.o. female presenting on 12/26/2023 for Medical Management of Chronic Issues (Here for f/u. Pt accompanied by daughter, Angelica Chessman and husband, Dorinda Hill. )   See prior note for details.  Seen here last month - known Alzheimer's dementia did not tolerate donepezil (HA, reflux, GI upset), latest tried Excelon patch 4.6mg  daily - this was unaffordable   Pending neurology eval 02/2024.   Recent brain MRI - volume loss predominantly in the temporoparietal and hippocampal regions of the brain suspicious for alzheimer's dementia. Also has chronic small vessel ischemic disease.   Atorvastatin 20mg  daily was started for h/o PAD based on abnormal volume plethysmography (L 0.18, R 0.42) by house calls screen - tolerating this well  Started pepcid 20mg  daily with benefit.  HAs have improved.   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 Discussing biopsy vs radiation treatment.      Relevant past medical, surgical, family and social history reviewed and updated as indicated. Interim medical history since our last visit reviewed. Allergies and medications reviewed and updated. Outpatient Medications Prior to Visit  Medication Sig Dispense Refill   albuterol (VENTOLIN HFA) 108 (90 Base) MCG/ACT inhaler Inhale 2 puffs into the lungs every 6 (six) hours as needed for wheezing or shortness of breath. 8 g 6   atorvastatin (LIPITOR) 20 MG tablet Take 1 tablet (20 mg total) by mouth daily. 30 tablet 11   cyanocobalamin (VITAMIN B12) 1000 MCG tablet Take 1 tablet (1,000 mcg  total) by mouth daily. 90 tablet 3   famotidine (PEPCID) 20 MG tablet Take 1 tablet (20 mg total) by mouth daily as needed for heartburn or indigestion.     Multiple Vitamin (MULTIVITAMIN) tablet Take 1 tablet by mouth daily.     rivastigmine (EXELON) 4.6 mg/24hr Place 1 patch (4.6 mg total) onto the skin daily. 30 patch 3   No facility-administered medications prior to visit.     Per HPI unless specifically indicated in ROS section below Review of Systems  Objective:  BP 130/84   Pulse 73   Temp 98.1 F (36.7 C) (Oral)   Ht 5\' 2"  (1.575 m)   Wt 143 lb 6 oz (65 kg)   SpO2 95%   BMI 26.22 kg/m   Wt Readings from Last 3 Encounters:  12/26/23 143 lb 6 oz (65 kg)  12/26/23 142 lb 12.8 oz (64.8 kg)  12/05/23 146 lb 2 oz (66.3 kg)      Physical Exam Vitals and nursing note reviewed.  Constitutional:      Appearance: Normal appearance. She is not ill-appearing.  Cardiovascular:     Rate and Rhythm: Normal rate and regular rhythm.     Pulses: Normal pulses.     Heart sounds: Normal heart sounds. No murmur heard. Pulmonary:     Effort: Pulmonary effort is normal. No respiratory distress.     Breath sounds: Normal breath sounds. No wheezing, rhonchi or rales.  Musculoskeletal:  Right lower leg: No edema.     Left lower leg: No edema.  Skin:    General: Skin is warm and dry.     Findings: No rash.  Neurological:     Mental Status: She is alert.       Results for orders placed or performed in visit on 12/05/23  Lipid panel   Collection Time: 12/05/23 12:48 PM  Result Value Ref Range   Cholesterol 191 0 - 200 mg/dL   Triglycerides 865.7 0.0 - 149.0 mg/dL   HDL 84.69 >62.95 mg/dL   VLDL 28.4 0.0 - 13.2 mg/dL   LDL Cholesterol 440 (H) 0 - 99 mg/dL   Total CHOL/HDL Ratio 3    NonHDL 134.75   Comprehensive metabolic panel   Collection Time: 12/05/23 12:48 PM  Result Value Ref Range   Sodium 143 135 - 145 mEq/L   Potassium 4.5 3.5 - 5.1 mEq/L   Chloride 107 96 - 112  mEq/L   CO2 31 19 - 32 mEq/L   Glucose, Bld 93 70 - 99 mg/dL   BUN 16 6 - 23 mg/dL   Creatinine, Ser 1.02 0.40 - 1.20 mg/dL   Total Bilirubin 0.6 0.2 - 1.2 mg/dL   Alkaline Phosphatase 59 39 - 117 U/L   AST 17 0 - 37 U/L   ALT 14 0 - 35 U/L   Total Protein 6.0 6.0 - 8.3 g/dL   Albumin 4.1 3.5 - 5.2 g/dL   GFR 72.53 >66.44 mL/min   Calcium 9.2 8.4 - 10.5 mg/dL  TSH   Collection Time: 12/05/23 12:48 PM  Result Value Ref Range   TSH 1.26 0.35 - 5.50 uIU/mL  CBC with Differential/Platelet   Collection Time: 12/05/23 12:48 PM  Result Value Ref Range   WBC 5.7 4.0 - 10.5 K/uL   RBC 4.24 3.87 - 5.11 Mil/uL   Hemoglobin 13.2 12.0 - 15.0 g/dL   HCT 03.4 74.2 - 59.5 %   MCV 95.3 78.0 - 100.0 fl   MCHC 32.7 30.0 - 36.0 g/dL   RDW 63.8 75.6 - 43.3 %   Platelets 212.0 150.0 - 400.0 K/uL   Neutrophils Relative % 68.0 43.0 - 77.0 %   Lymphocytes Relative 24.6 12.0 - 46.0 %   Monocytes Relative 5.4 3.0 - 12.0 %   Eosinophils Relative 1.2 0.0 - 5.0 %   Basophils Relative 0.8 0.0 - 3.0 %   Neutro Abs 3.9 1.4 - 7.7 K/uL   Lymphs Abs 1.4 0.7 - 4.0 K/uL   Monocytes Absolute 0.3 0.1 - 1.0 K/uL   Eosinophils Absolute 0.1 0.0 - 0.7 K/uL   Basophils Absolute 0.0 0.0 - 0.1 K/uL  Vitamin B12   Collection Time: 12/05/23 12:48 PM  Result Value Ref Range   Vitamin B-12 359 211 - 911 pg/mL   MR Brain Wo Contrast CLINICAL DATA:  Dementia, vascular etiology suspected.   EXAM: MRI HEAD WITHOUT CONTRAST   TECHNIQUE: Multiplanar, multiecho pulse sequences of the brain and surrounding structures were obtained without intravenous contrast.   COMPARISON:  MRI brain 08/11/2011.  Head CT 02/05/2021.   FINDINGS: Brain: No acute infarct or hemorrhage. 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 No hydrocephalus or extra-axial collection. No mass or abnormal susceptibility. Generalized volume loss with slight temporoparietal predominance  and bilateral hippocampal atrophy.   Vascular: Normal flow voids.   Skull and upper cervical spine: Normal marrow signal.   Sinuses/Orbits: Mild mucosal disease in the  right maxillary sinus. Orbits are unremarkable.   Other: None.   IMPRESSION: 1. No acute intracranial abnormality or mass. 2. Generalized volume loss with slight temporoparietal predominance and bilateral hippocampal atrophy. 3. 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   Electronically Signed   By: Orvan Falconer M.D.   On: 12/05/2023 12:18  Assessment & Plan:   Problem List Items Addressed This Visit     Headache   This is better - either off donepezil (med side effect), or with less tylenol use [facility]).       Alzheimer dementia with behavioral disturbance (HCC) - Primary   Progressive presumed Alz dementia.  Did not tolerate oral donepezil.  Doing better with Excelon patch 4.6mg  daily, some concern for longterm affordability.  Will increase Excelon patch to 9.5mg /day dose next fill.  Keep neurology evaluation 02/2024.       Relevant Medications   rivastigmine (EXELON) 9.5 mg/24hr   Pulmonary nodule, left   Appreciate pulmonology care - discussing tissue sampling vs radiation treatment of suspicious LLL nodule.       Vitamin B12 deficiency   Continue oral b12 daily.       Acid reflux   This is better with OTC pepcid use.       PAD (peripheral artery disease) (HCC)   By house calls screening test Tolerating atorvastatin 20mg  well started 11/2023 - continue.         Meds ordered this encounter  Medications   rivastigmine (EXELON) 9.5 mg/24hr    Sig: Place 1 patch (9.5 mg total) onto the skin daily.    Dispense:  30 patch    Refill:  3    Note higher dose    No orders of the defined types were placed in this encounter.   Patient Instructions  Since we've done well with Excelon patch at 4.6mg , next dose will be  9.5mg  patch/day sent to pharmacy - price out and let me know if unaffordable.  I will await plan for lung spot on left Keep neurology appointment in May.  Good to see you today  Return in 4 months after 05/04/2024 for physical/wellness visit  Follow up plan: Return in about 4 months (around 05/04/2024) for annual exam, prior fasting for blood work, medicare wellness visit.  Eustaquio Boyden, MD

## 2023-12-29 ENCOUNTER — Ambulatory Visit: Payer: Medicare HMO | Admitting: Podiatry

## 2023-12-29 DIAGNOSIS — M79674 Pain in right toe(s): Secondary | ICD-10-CM | POA: Diagnosis not present

## 2023-12-29 DIAGNOSIS — B351 Tinea unguium: Secondary | ICD-10-CM | POA: Diagnosis not present

## 2023-12-29 DIAGNOSIS — M79675 Pain in left toe(s): Secondary | ICD-10-CM

## 2023-12-29 NOTE — Progress Notes (Signed)
  Subjective:  Patient ID: Lindsay Mason, female    DOB: Jun 29, 1951,   MRN: [id number]  No chief complaint on file.   73 y.o. female presents for concern of thickened elongated and painful nails that are difficult to trim. Requesting to have them trimmed today.  PCP:  Eustaquio Boyden, MD    . Denies any other pedal complaints. Denies n/v/f/c.   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 CFT <3 seconds. Normal hair growth on digits. No edema.  Skin. No lacerations or abrasions bilateral feet. Nails 1-5 bilateral are elongated thickened and with subungual debris.  Musculoskeletal: MMT 5/5 bilateral lower extremities in DF, PF, Inversion and Eversion. Deceased ROM in DF of ankle joint.  Neurological: Sensation intact to light touch.   Assessment:   1. Pain due to onychomycosis of toenails of both feet      Plan:  Patient was evaluated and treated and all questions answered. -Mechanically debrided all nails 1-5 bilateral using sterile nail nipper and filed with dremel without incident  -Answered all patient questions -Patient to return  in 3 months for at risk foot care -Patient advised to call the office if any problems or questions arise in the meantime.   Louann Sjogren, DPM

## 2023-12-30 ENCOUNTER — Other Ambulatory Visit: Payer: Self-pay | Admitting: Acute Care

## 2023-12-30 ENCOUNTER — Telehealth: Payer: Self-pay | Admitting: Acute Care

## 2023-12-30 DIAGNOSIS — R911 Solitary pulmonary nodule: Secondary | ICD-10-CM

## 2023-12-30 NOTE — Telephone Encounter (Signed)
 I have called patient daughter. Dr. Kathrynn Running has said he will see patient without additional bronch with biopsy. I called daughter to let her know, and have placed the referral to radiation oncology.

## 2024-01-09 ENCOUNTER — Emergency Department (HOSPITAL_COMMUNITY)

## 2024-01-09 ENCOUNTER — Inpatient Hospital Stay (HOSPITAL_COMMUNITY)
Admission: EM | Admit: 2024-01-09 | Discharge: 2024-01-17 | DRG: 200 | Disposition: A | Discharge status: Skilled Nursing Facility | Attending: Internal Medicine | Admitting: Internal Medicine

## 2024-01-09 ENCOUNTER — Observation Stay (HOSPITAL_COMMUNITY)

## 2024-01-09 ENCOUNTER — Other Ambulatory Visit: Payer: Self-pay

## 2024-01-09 ENCOUNTER — Encounter (HOSPITAL_COMMUNITY): Payer: Self-pay

## 2024-01-09 DIAGNOSIS — K219 Gastro-esophageal reflux disease without esophagitis: Secondary | ICD-10-CM | POA: Diagnosis present

## 2024-01-09 DIAGNOSIS — J9811 Atelectasis: Secondary | ICD-10-CM | POA: Diagnosis not present

## 2024-01-09 DIAGNOSIS — D72829 Elevated white blood cell count, unspecified: Secondary | ICD-10-CM | POA: Diagnosis present

## 2024-01-09 DIAGNOSIS — R1111 Vomiting without nausea: Secondary | ICD-10-CM | POA: Diagnosis not present

## 2024-01-09 DIAGNOSIS — R0902 Hypoxemia: Secondary | ICD-10-CM | POA: Diagnosis not present

## 2024-01-09 DIAGNOSIS — Z87891 Personal history of nicotine dependence: Secondary | ICD-10-CM

## 2024-01-09 DIAGNOSIS — S270XXA Traumatic pneumothorax, initial encounter: Secondary | ICD-10-CM | POA: Diagnosis not present

## 2024-01-09 DIAGNOSIS — Z79899 Other long term (current) drug therapy: Secondary | ICD-10-CM

## 2024-01-09 DIAGNOSIS — F03A18 Unspecified dementia, mild, with other behavioral disturbance: Secondary | ICD-10-CM | POA: Diagnosis present

## 2024-01-09 DIAGNOSIS — T797XXA Traumatic subcutaneous emphysema, initial encounter: Secondary | ICD-10-CM | POA: Diagnosis not present

## 2024-01-09 DIAGNOSIS — J9311 Primary spontaneous pneumothorax: Secondary | ICD-10-CM

## 2024-01-09 DIAGNOSIS — I517 Cardiomegaly: Secondary | ICD-10-CM | POA: Diagnosis not present

## 2024-01-09 DIAGNOSIS — E785 Hyperlipidemia, unspecified: Secondary | ICD-10-CM | POA: Diagnosis present

## 2024-01-09 DIAGNOSIS — J939 Pneumothorax, unspecified: Principal | ICD-10-CM | POA: Diagnosis present

## 2024-01-09 DIAGNOSIS — Z823 Family history of stroke: Secondary | ICD-10-CM

## 2024-01-09 DIAGNOSIS — R300 Dysuria: Secondary | ICD-10-CM | POA: Diagnosis not present

## 2024-01-09 DIAGNOSIS — S2242XA Multiple fractures of ribs, left side, initial encounter for closed fracture: Secondary | ICD-10-CM | POA: Diagnosis present

## 2024-01-09 DIAGNOSIS — Z880 Allergy status to penicillin: Secondary | ICD-10-CM

## 2024-01-09 DIAGNOSIS — Z85118 Personal history of other malignant neoplasm of bronchus and lung: Secondary | ICD-10-CM

## 2024-01-09 DIAGNOSIS — R339 Retention of urine, unspecified: Secondary | ICD-10-CM | POA: Diagnosis not present

## 2024-01-09 DIAGNOSIS — Z902 Acquired absence of lung [part of]: Secondary | ICD-10-CM

## 2024-01-09 DIAGNOSIS — K5730 Diverticulosis of large intestine without perforation or abscess without bleeding: Secondary | ICD-10-CM | POA: Diagnosis not present

## 2024-01-09 DIAGNOSIS — X58XXXA Exposure to other specified factors, initial encounter: Secondary | ICD-10-CM | POA: Diagnosis present

## 2024-01-09 DIAGNOSIS — Z4682 Encounter for fitting and adjustment of non-vascular catheter: Secondary | ICD-10-CM | POA: Diagnosis not present

## 2024-01-09 DIAGNOSIS — R1084 Generalized abdominal pain: Secondary | ICD-10-CM | POA: Diagnosis not present

## 2024-01-09 DIAGNOSIS — M810 Age-related osteoporosis without current pathological fracture: Secondary | ICD-10-CM | POA: Diagnosis present

## 2024-01-09 DIAGNOSIS — E876 Hypokalemia: Secondary | ICD-10-CM | POA: Diagnosis not present

## 2024-01-09 DIAGNOSIS — R509 Fever, unspecified: Secondary | ICD-10-CM | POA: Diagnosis not present

## 2024-01-09 DIAGNOSIS — Z8249 Family history of ischemic heart disease and other diseases of the circulatory system: Secondary | ICD-10-CM

## 2024-01-09 DIAGNOSIS — J439 Emphysema, unspecified: Secondary | ICD-10-CM | POA: Diagnosis present

## 2024-01-09 DIAGNOSIS — R911 Solitary pulmonary nodule: Secondary | ICD-10-CM | POA: Diagnosis present

## 2024-01-09 DIAGNOSIS — F03A4 Unspecified dementia, mild, with anxiety: Secondary | ICD-10-CM | POA: Diagnosis present

## 2024-01-09 DIAGNOSIS — S2239XA Fracture of one rib, unspecified side, initial encounter for closed fracture: Secondary | ICD-10-CM | POA: Diagnosis not present

## 2024-01-09 LAB — COMPREHENSIVE METABOLIC PANEL WITH GFR
ALT: 23 U/L (ref 0–44)
AST: 24 U/L (ref 15–41)
Albumin: 4.2 g/dL (ref 3.5–5.0)
Alkaline Phosphatase: 61 U/L (ref 38–126)
Anion gap: 11 (ref 5–15)
BUN: 20 mg/dL (ref 8–23)
CO2: 26 mmol/L (ref 22–32)
Calcium: 9.1 mg/dL (ref 8.9–10.3)
Chloride: 101 mmol/L (ref 98–111)
Creatinine, Ser: 0.79 mg/dL (ref 0.44–1.00)
GFR, Estimated: >60 mL/min (ref >60)
Glucose, Bld: 144 mg/dL — ABNORMAL HIGH (ref 70–99)
Potassium: 3.6 mmol/L (ref 3.5–5.1)
Sodium: 138 mmol/L (ref 135–145)
Total Bilirubin: 0.7 mg/dL (ref 0.0–1.2)
Total Protein: 6.7 g/dL (ref 6.5–8.1)

## 2024-01-09 LAB — CBC WITH DIFFERENTIAL/PLATELET
Abs Immature Granulocytes: 0.07 10*3/uL (ref 0.00–0.07)
Basophils Absolute: 0 10*3/uL (ref 0.0–0.1)
Basophils Relative: 0 %
Eosinophils Absolute: 0 10*3/uL (ref 0.0–0.5)
Eosinophils Relative: 0 %
HCT: 38.5 % (ref 36.0–46.0)
Hemoglobin: 12.3 g/dL (ref 12.0–15.0)
Immature Granulocytes: 1 %
Lymphocytes Relative: 4 %
Lymphs Abs: 0.5 10*3/uL — ABNORMAL LOW (ref 0.7–4.0)
MCH: 31.1 pg (ref 26.0–34.0)
MCHC: 31.9 g/dL (ref 30.0–36.0)
MCV: 97.2 fL (ref 80.0–100.0)
Monocytes Absolute: 0.4 10*3/uL (ref 0.1–1.0)
Monocytes Relative: 4 %
Neutro Abs: 10.6 10*3/uL — ABNORMAL HIGH (ref 1.7–7.7)
Neutrophils Relative %: 91 %
Platelets: 187 10*3/uL (ref 150–400)
RBC: 3.96 MIL/uL (ref 3.87–5.11)
RDW: 14.2 % (ref 11.5–15.5)
WBC: 11.6 10*3/uL — ABNORMAL HIGH (ref 4.0–10.5)
nRBC: 0 % (ref 0.0–0.2)

## 2024-01-09 LAB — URINALYSIS, ROUTINE W REFLEX MICROSCOPIC
Bilirubin Urine: NEGATIVE
Glucose, UA: NEGATIVE mg/dL
Hgb urine dipstick: NEGATIVE
Ketones, ur: NEGATIVE mg/dL
Leukocytes,Ua: NEGATIVE
Nitrite: NEGATIVE
Protein, ur: NEGATIVE mg/dL
Specific Gravity, Urine: 1.018 (ref 1.005–1.030)
pH: 6 (ref 5.0–8.0)

## 2024-01-09 LAB — LIPASE, BLOOD: Lipase: 50 U/L (ref 11–51)

## 2024-01-09 MED ORDER — LORAZEPAM 2 MG/ML IJ SOLN
INTRAMUSCULAR | Status: AC
  Administered 2024-01-09: 1 mg via INTRAVENOUS
  Filled 2024-01-09: qty 1

## 2024-01-09 MED ORDER — ONDANSETRON HCL 4 MG/2ML IJ SOLN
4.0000 mg | Freq: Once | INTRAMUSCULAR | Status: AC
  Administered 2024-01-09: 4 mg via INTRAVENOUS
  Filled 2024-01-09: qty 2

## 2024-01-09 MED ORDER — RIVASTIGMINE 9.5 MG/24HR TD PT24
9.5000 mg | MEDICATED_PATCH | Freq: Every day | TRANSDERMAL | Status: DC
  Administered 2024-01-09 – 2024-01-17 (×9): 9.5 mg via TRANSDERMAL
  Filled 2024-01-09 (×10): qty 1

## 2024-01-09 MED ORDER — ACETAMINOPHEN 650 MG RE SUPP: 650.0000 mg | Freq: Four times a day (QID) | RECTAL | Status: DC | PRN

## 2024-01-09 MED ORDER — IOHEXOL 300 MG/ML  SOLN
100.0000 mL | Freq: Once | INTRAMUSCULAR | Status: AC | PRN
  Administered 2024-01-09: 100 mL via INTRAVENOUS

## 2024-01-09 MED ORDER — LORAZEPAM 2 MG/ML IJ SOLN: 1.0000 mg | Freq: Once | INTRAMUSCULAR | Status: AC

## 2024-01-09 MED ORDER — ENOXAPARIN SODIUM 40 MG/0.4ML IJ SOSY
40.0000 mg | PREFILLED_SYRINGE | Freq: Every day | INTRAMUSCULAR | Status: DC
  Administered 2024-01-09 – 2024-01-16 (×8): 40 mg via SUBCUTANEOUS
  Filled 2024-01-09 (×8): qty 0.4

## 2024-01-09 MED ORDER — TRAZODONE HCL 50 MG PO TABS
25.0000 mg | ORAL_TABLET | Freq: Every evening | ORAL | Status: DC | PRN
  Administered 2024-01-10 – 2024-01-16 (×6): 25 mg via ORAL
  Filled 2024-01-09 (×6): qty 1

## 2024-01-09 MED ORDER — HYDROCODONE-ACETAMINOPHEN 5-325 MG PO TABS
1.0000 | ORAL_TABLET | ORAL | Status: DC | PRN
Start: 2024-01-09 — End: 2024-01-17
  Administered 2024-01-09 (×3): 2 via ORAL
  Administered 2024-01-10: 1 via ORAL
  Administered 2024-01-10: 2 via ORAL
  Administered 2024-01-10 – 2024-01-11 (×2): 1 via ORAL
  Administered 2024-01-11 (×2): 2 via ORAL
  Administered 2024-01-11 – 2024-01-17 (×18): 1 via ORAL
  Filled 2024-01-09: qty 1
  Filled 2024-01-09 (×2): qty 2
  Filled 2024-01-09: qty 1
  Filled 2024-01-09: qty 2
  Filled 2024-01-09 (×3): qty 1
  Filled 2024-01-09: qty 2
  Filled 2024-01-09 (×5): qty 1
  Filled 2024-01-09: qty 2
  Filled 2024-01-09 (×6): qty 1
  Filled 2024-01-09 (×2): qty 2
  Filled 2024-01-09: qty 1
  Filled 2024-01-09: qty 2
  Filled 2024-01-09 (×2): qty 1
  Filled 2024-01-09: qty 2
  Filled 2024-01-09 (×2): qty 1

## 2024-01-09 MED ORDER — ALBUTEROL SULFATE (2.5 MG/3ML) 0.083% IN NEBU
2.5000 mg | INHALATION_SOLUTION | RESPIRATORY_TRACT | Status: DC | PRN
  Administered 2024-01-10: 2.5 mg via RESPIRATORY_TRACT
  Filled 2024-01-09 (×2): qty 3

## 2024-01-09 MED ORDER — SODIUM CHLORIDE 0.9 % IV BOLUS
1000.0000 mL | Freq: Once | INTRAVENOUS | Status: AC
  Administered 2024-01-09: 1000 mL via INTRAVENOUS

## 2024-01-09 MED ORDER — ONDANSETRON HCL 4 MG PO TABS: 4.0000 mg | ORAL_TABLET | Freq: Four times a day (QID) | ORAL | Status: DC | PRN

## 2024-01-09 MED ORDER — ACETAMINOPHEN 325 MG PO TABS
650.0000 mg | ORAL_TABLET | Freq: Four times a day (QID) | ORAL | Status: DC | PRN
  Administered 2024-01-10 – 2024-01-15 (×6): 650 mg via ORAL
  Filled 2024-01-09 (×6): qty 2

## 2024-01-09 MED ORDER — SODIUM CHLORIDE (PF) 0.9 % IJ SOLN
INTRAMUSCULAR | Status: AC
  Filled 2024-01-09: qty 50

## 2024-01-09 MED ORDER — ATORVASTATIN CALCIUM 20 MG PO TABS
20.0000 mg | ORAL_TABLET | Freq: Every day | ORAL | Status: DC
  Administered 2024-01-09 – 2024-01-17 (×9): 20 mg via ORAL
  Filled 2024-01-09 (×9): qty 1

## 2024-01-09 MED ORDER — FAMOTIDINE 20 MG PO TABS: 20.0000 mg | ORAL_TABLET | Freq: Every day | ORAL | Status: DC | PRN

## 2024-01-09 MED ORDER — ONDANSETRON HCL 4 MG/2ML IJ SOLN
4.0000 mg | Freq: Four times a day (QID) | INTRAMUSCULAR | Status: DC | PRN
  Administered 2024-01-11: 4 mg via INTRAVENOUS
  Filled 2024-01-09: qty 2

## 2024-01-09 MED ORDER — FENTANYL CITRATE PF 50 MCG/ML IJ SOSY
50.0000 ug | PREFILLED_SYRINGE | Freq: Once | INTRAMUSCULAR | Status: AC
  Administered 2024-01-09: 50 ug via INTRAVENOUS
  Filled 2024-01-09: qty 1

## 2024-01-09 NOTE — Plan of Care (Signed)

## 2024-01-09 NOTE — H&P (Signed)
 History and Physical  Lindsay Mason JXB:[id number] DOB: October 13, 1951 DOA: 01/09/2024  PCP: Eustaquio Boyden, MD   Chief Complaint: Left flank pain  HPI: Lindsay Mason is a 73 y.o. female with medical history significant for prior tobacco abuse, COPD on room air, mild dementia, right lower lobectomy being admitted to the hospital with left sided pneumothorax.  She lives with her husband, he also has a history of osteoporosis and prior rib fractures per family.  She started to complain of left flank pain last night, family brought her for evaluation and she was found after CT of the abdomen pelvis to have a large left pneumothorax.  Family denies any recent fevers, chills, significant cough, or falls.  Chest tube has been placed in the emergency department, with good initial reexpansion of her chest.  Pulmonology has been consulted to assist with possible repositioning of her chest tube, hospitalist admission for observation was requested.  Review of Systems: Please see HPI for pertinent positives and negatives. A complete 10 system review of systems are otherwise negative.  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   by CT   Past Surgical History:  Procedure Laterality Date   CATARACT EXTRACTION W/PHACO Right 02/21/2021   Procedure: CATARACT EXTRACTION PHACO AND INTRAOCULAR LENS PLACEMENT (IOC) RIGHT;  Surgeon: Lockie Mola, MD;  Location[facility];  Service: Ophthalmology;  Laterality: Right;  cde 4.75 00:41.1  minutes 11.5%   CATARACT EXTRACTION W/PHACO Left 03/14/2021   Procedure: CATARACT EXTRACTION PHACO AND INTRAOCULAR LENS PLACEMENT (IOC) LEFT 4.07 00:44.6;  Surgeon: Lockie Mola, MD;  Location: [facility];  Service: Ophthalmology;  Laterality: Left;   COLONOSCOPY  06/2015   microscopic colitis Christella Hartigan)   DEXA  10/2008   Osteoporosis   DEXA  02/2014   T score osteopenia hip, osteoporosis spine, scoliosis   ELECTROMAGNETIC NAVIGATION BROCHOSCOPY  01/20/2020   Procedure: ELECTROMAGNETIC NAVIGATION BRONCHOSCOPY;  Surgeon: Josephine Igo, DO;  Location: [facility];  Service: Pulmonary;;   ETT  10/2011   WNL, no evidence of ischemia, excellent exercise tolerance   EXPLORATORY LAPAROTOMY     INTERCOSTAL NERVE BLOCK Right 01/20/2020   Procedure: Intercostal Nerve Block;  Surgeon: Corliss Skains, MD;  Location: [facility];  Service: Thoracic;  Laterality: Right;   MRI head  07/2011   multiple foci deep and subcortical white matter, chronic ischemic vs demyelinating, no active disease   NODE DISSECTION  01/20/2020   Procedure: Node Dissection;  Surgeon: Corliss Skains, MD;  Location: [facility];  Service: Thoracic;;   SUBMUCOSAL TATTOO INJECTION  01/20/2020   Procedure: SUBSTITIAL TATTOO INJECTION;  Surgeon: Josephine Igo, DO;  Location: [facility];  Service: Pulmonary;;   TONSILLECTOMY AND ADENOIDECTOMY     Vein removal     removed from legs   VIDEO ASSISTED THORACOSCOPY (VATS)/WEDGE RESECTION Right 01/20/2020   XI ROBOTIC ASSISTED THORASCOPY-RIGHT LOWER LOBE WEDGE RESECTION, RIGHT LOWER LOBECTOMYRightGeneral   VIDEO BRONCHOSCOPY  01/20/2020   Procedure: VIDEO  BRONCHOSCOPY WITH FLUORO;  Surgeon: Josephine Igo, DO;  Location: [facility];  Service: Pulmonary;;   Social History:  reports that she quit smoking about 8 years ago. Her smoking use included cigarettes. She started smoking about 45 years ago. She has a 36.7 pack-year smoking history. She has never used smokeless  tobacco. She reports current alcohol use. She reports that she does not use drugs.  Allergies  Allergen Reactions   Penicillins Swelling    REACTION: swelling tongue - States has taken Cephalosporins without difficulty in past Did it involve swelling of the face/tongue/throat, SOB, or low BP? Yes Did it involve sudden or severe rash/hives, skin peeling, or any reaction on the inside of your mouth or nose? No Did you need to seek medical attention at a hospital or doctor's office? Yes When did it last happen? Childhood reaction If all above answers are "NO", may proceed with cephalosporin use.     Family History  Problem Relation Age of Onset   Osteoporosis Other        great grandmother   Stroke Maternal Grandmother    Coronary artery disease Maternal Grandfather 42   Diabetes Maternal Grandfather    Coronary artery disease Paternal Grandmother 24   Coronary artery disease Paternal Grandfather 69   Lung cancer Maternal Aunt      Prior to Admission medications   Medication Sig Start Date End Date Taking? Authorizing Provider  albuterol (VENTOLIN HFA) 108 (90 Base) MCG/ACT inhaler Inhale 2 puffs into the lungs every 6 (six) hours as needed for wheezing or shortness of breath. 08/15/22   Icard, Rachel Bo, DO  atorvastatin (LIPITOR) 20 MG tablet Take 1 tablet (20 mg total) by mouth daily. 12/10/23   Eustaquio Boyden, MD  cyanocobalamin (VITAMIN B12) 1000 MCG tablet Take 1 tablet (1,000 mcg total) by mouth daily. 11/01/22   Eustaquio Boyden, MD  famotidine (PEPCID) 20 MG tablet Take 1 tablet (20 mg total) by mouth daily as needed for heartburn or indigestion. 12/05/23   Eustaquio Boyden, MD  Multiple Vitamin (MULTIVITAMIN) tablet Take 1 tablet by mouth daily.    [provider]  rivastigmine (EXELON) 4.6 mg/24hr Place 1 patch (4.6 mg total) onto the skin daily. 12/05/23   Eustaquio Boyden, MD  rivastigmine (EXELON) 9.5 mg/24hr Place 1 patch (9.5 mg total) onto the skin daily.  12/26/23   Eustaquio Boyden, MD    Physical Exam: BP 126/89   Pulse 87   Temp 97.7 F (36.5 C) (Oral)   Resp (!) 23   SpO2 99%  General:  Alert, oriented, calm, in no acute distress, she is resting on room air, actually napping.  Her son and husband are at the bedside. Cardiovascular: RRR, no murmurs or rubs, no peripheral edema  Respiratory: clear to auscultation bilaterally on anterior exam, left-sided chest tube is in place, no wheezes, no crackles  Abdomen: soft, nontender, nondistended, normal bowel tones heard  Skin: dry, no rashes  Musculoskeletal: no joint effusions, normal range of motion  Psychiatric: appropriate affect, normal speech  Neurologic: extraocular muscles intact, clear speech, moving all extremities with intact sensorium         Labs on Admission:  Basic Metabolic Panel: Recent Labs  Lab 01/09/24 0626  NA 138  K 3.6  CL 101  CO2 26  GLUCOSE 144*  BUN 20  CREATININE 0.79  CALCIUM 9.1   Liver Function Tests: Recent Labs  Lab 01/09/24 0626  AST 24  ALT 23  ALKPHOS 61  BILITOT 0.7  PROT 6.7  ALBUMIN 4.2   Recent Labs  Lab 01/09/24 0626  LIPASE 50   No results for input(s): "AMMONIA" in the last 168 hours. CBC: Recent Labs  Lab 01/09/24 0626  WBC 11.6*  NEUTROABS 10.6*  HGB 12.3  HCT 38.5  MCV 97.2  PLT 187   Cardiac Enzymes: No results for input(s): "CKTOTAL", "CKMB", "CKMBINDEX", "TROPONINI" in the last 168 hours. BNP (last 3 results) No results for input(s): "BNP" in the last 8760 hours.  ProBNP (last 3 results) No results for input(s): "PROBNP" in the last 8760 hours.  CBG: No results for input(s): "GLUCAP" in the last 168 hours.  Radiological Exams on Admission: DG Chest Portable 1 View Result Date: 01/09/2024 CLINICAL DATA:  Suspected pneumothorax on CT scan EXAM: PORTABLE CHEST 1 VIEW COMPARISON:  Earlier same day CT abdomen and pelvis, chest radiograph dated 06/02/2020 FINDINGS: Patient is rotated to the right.  Near-complete atelectasis of the left lung secondary to large left pneumothorax. The heart size and mediastinal contours are within normal limits. Minimally displaced tenth rib fracture is better seen on same day CT. IMPRESSION: Large left pneumothorax with near-complete atelectasis of the left lung. Critical Value/emergent results were called by telephone at the time of interpretation on 01/09/2024 at 8:40 am to provider Estelle June, who verbally acknowledged these results. Electronically Signed   By: Agustin Cree M.D.   On: 01/09/2024 08:56   CT ABDOMEN PELVIS W CONTRAST Result Date: 01/09/2024 CLINICAL DATA:  Acute onset left-sided abdominal pain EXAM: CT ABDOMEN AND PELVIS WITH CONTRAST TECHNIQUE: Multidetector CT imaging of the abdomen and pelvis was performed using the standard protocol following bolus administration of intravenous contrast. RADIATION DOSE REDUCTION: This exam was performed according to the departmental dose-optimization program which includes automated exposure control, adjustment of the mA and/or kV according to patient size and/or use of iterative reconstruction technique. CONTRAST:  OMNIPAQUE IOHEXOL 300 MG/ML  SOLN COMPARISON:  Nuclear medicine PET dated 12/01/2023 FINDINGS: Lower chest: Near complete atelectasis of the left lung secondary to large left pneumothorax and trace pleural effusion. No substantial rightward mediastinal shift. Hepatobiliary: Subcentimeter hypodensity along peripheral segment 8 (4:23), too small to characterize but unchanged and likely a cyst. Go to adrenals nodular thickening of the left adrenal gland, previously characterized as an adenoma. No right adrenal nodule. Normal gallbladder. Pancreas: No focal lesions or main ductal dilation. Spleen: Normal in size without focal abnormality. Adrenals/Urinary Tract: No adrenal nodules. No suspicious renal mass, calculi or hydronephrosis. No focal bladder wall thickening. Stomach/Bowel: Normal appearance of the  stomach. No evidence of bowel wall thickening, distention, or inflammatory changes. Colonic diverticulosis without acute diverticulitis. Normal appendix. Vascular/Lymphatic: Aortic atherosclerosis. No enlarged abdominal or pelvic lymph nodes. Reproductive: Again seen are adjacent right adnexal simple-appearing cystic foci measuring up to 3.0 cm (4:67), not substantially changed dating back to 01/03/2020. No specific follow-up imaging recommended. No left adnexal mass. Other: No free fluid, fluid collection, or free air. Musculoskeletal: Minimally displaced tenth posterior left rib fracture (2:23). Cortical undulation of the left lateral ninth through eleventh ribs. Trace subcutaneous soft tissue stranding overlying the left posterior lower chest, likely soft tissue contusion. IMPRESSION: 1. Large left pneumothorax with near complete atelectasis of the left lung. No substantial rightward mediastinal shift. 2. Minimally displaced tenth posterior left rib fracture. Cortical undulation of the left lateral ninth through eleventh ribs, which may represent nondisplaced fractures. 3. No acute abnormality in the abdomen or pelvis. 4.  Aortic Atherosclerosis (ICD10-I70.0).  Critical Value/emergent results were called by telephone at the time of interpretation on 01/09/2024 at 8:40 am to provider Estelle June, who verbally acknowledged these results. Electronically Signed   By: Agustin Cree M.D.   On: 01/09/2024 08:54   Assessment/Plan DEONDRIA PURYEAR is a 73 y.o. female with medical history significant for prior tobacco abuse, COPD on room air, right lower lobectomy being admitted to the hospital with left sided pneumothorax.  Left-sided pneumothorax-unclear whether this is traumatic given associated rib fractures (though family denies any recent falls), or spontaneous given her known history of emphysema. -Observation admission -She is stable on room air -Continue left-sided chest tube -Appreciate pulmonology  involvement and recommendations  GERD-Pepcid as needed  Dementia-Exelon patch  Hyperlipidemia-Lipitor  DVT prophylaxis: Lovenox     Code Status: Full Code  Consults called: Pulmonology  Admission status: Observation  Time spent: 48 minutes  Mckennon Zwart Sharlette Dense MD [facility] Pager [phone]  If 7PM-7AM, please contact night-coverage [email or web address] Password [facility]  01/09/2024, 10:25 AM

## 2024-01-09 NOTE — ED Provider Notes (Signed)
 [place] EMERGENCY DEPARTMENT AT [facility] Provider Note   CSN: [id number] Arrival date & time: 01/09/24  0540     History  Chief Complaint  Patient presents with   Abdominal Pain   HPI Lindsay Mason is a 73 y.o. female with lung cancer s/p right lower lobectomy, COPD, early onset dementia presenting for abdominal pain. Started around 1 AM this morning. She states the pain originated in the left flank and radiates to the left lower quadrant.  She vomited once this morning and has felt nauseous. Had a normal bowel movement yesterday denies diarrhea. She denies urinary symptoms.  The pain feels sharp at times.  States she has been intermittently shob but no chest pain.  States that the pain is very minimal at this point.  Her son also stated that this morning she had a "coughing fit" before the pain started.  Treated at home with an albuterol inhaler.  Patient denies falls.   Abdominal Pain      Home Medications Prior to Admission medications   Medication Sig Start Date End Date Taking? Authorizing Provider  albuterol (VENTOLIN HFA) 108 (90 Base) MCG/ACT inhaler Inhale 2 puffs into the lungs every 6 (six) hours as needed for wheezing or shortness of breath. 08/15/22   Icard, Rachel Bo, DO  atorvastatin (LIPITOR) 20 MG tablet Take 1 tablet (20 mg total) by mouth daily. 12/10/23   Eustaquio Boyden, MD  cyanocobalamin (VITAMIN B12) 1000 MCG tablet Take 1 tablet (1,000 mcg total) by mouth daily. 11/01/22   Eustaquio Boyden, MD  famotidine (PEPCID) 20 MG tablet Take 1 tablet (20 mg total) by mouth daily as needed for heartburn or indigestion. 12/05/23   Eustaquio Boyden, MD  Multiple Vitamin (MULTIVITAMIN) tablet Take 1 tablet by mouth daily.    [provider]  rivastigmine (EXELON) 4.6 mg/24hr Place 1 patch (4.6 mg total) onto the skin daily. 12/05/23   Eustaquio Boyden, MD  rivastigmine (EXELON) 9.5 mg/24hr Place 1 patch (9.5 mg total) onto the skin daily.  12/26/23   Eustaquio Boyden, MD      Allergies    Penicillins    Review of Systems   Review of Systems  Gastrointestinal:  Positive for abdominal pain.    Physical Exam Updated Vital Signs BP 126/89   Pulse 87   Temp 97.7 F (36.5 C) (Oral)   Resp (!) 23   SpO2 99%  Physical Exam Vitals and nursing note reviewed.  HENT:     Head: Normocephalic and atraumatic.     Mouth/Throat:     Mouth: Mucous membranes are moist.  Eyes:     General:        Right eye: No discharge.        Left eye: No discharge.     Conjunctiva/sclera: Conjunctivae normal.  Cardiovascular:     Rate and Rhythm: Normal rate and regular rhythm.     Pulses: Normal pulses.     Heart sounds: Normal heart sounds.  Pulmonary:     Effort: Pulmonary effort is normal.     Breath sounds: Normal breath sounds.  Abdominal:     General: Abdomen is flat.     Palpations: Abdomen is soft.     Tenderness: There is abdominal tenderness in the left lower quadrant. There is left CVA tenderness.  Skin:    General: Skin is warm and dry.  Neurological:     General: No focal deficit present.  Psychiatric:  Mood and Affect: Mood normal.     ED Results / Procedures / Treatments   Labs (all labs ordered are listed, but only abnormal results are displayed) Labs Reviewed  CBC WITH DIFFERENTIAL/PLATELET - Abnormal; Notable for the following components:      Result Value   WBC 11.6 (*)    Neutro Abs 10.6 (*)    Lymphs Abs 0.5 (*)    All other components within normal limits  COMPREHENSIVE METABOLIC PANEL WITH GFR - Abnormal; Notable for the following components:   Glucose, Bld 144 (*)    All other components within normal limits  LIPASE, BLOOD  URINALYSIS, ROUTINE W REFLEX MICROSCOPIC    EKG None  Radiology DG Chest Portable 1 View Result Date: 01/09/2024 CLINICAL DATA:  Suspected pneumothorax on CT scan EXAM: PORTABLE CHEST 1 VIEW COMPARISON:  Earlier same day CT abdomen and pelvis, chest radiograph  dated 06/02/2020 FINDINGS: Patient is rotated to the right. Near-complete atelectasis of the left lung secondary to large left pneumothorax. The heart size and mediastinal contours are within normal limits. Minimally displaced tenth rib fracture is better seen on same day CT. IMPRESSION: Large left pneumothorax with near-complete atelectasis of the left lung. Critical Value/emergent results were called by telephone at the time of interpretation on 01/09/2024 at 8:40 am to provider Estelle June, who verbally acknowledged these results. Electronically Signed   By: Agustin Cree M.D.   On: 01/09/2024 08:56   CT ABDOMEN PELVIS W CONTRAST Result Date: 01/09/2024 CLINICAL DATA:  Acute onset left-sided abdominal pain EXAM: CT ABDOMEN AND PELVIS WITH CONTRAST TECHNIQUE: Multidetector CT imaging of the abdomen and pelvis was performed using the standard protocol following bolus administration of intravenous contrast. RADIATION DOSE REDUCTION: This exam was performed according to the departmental dose-optimization program which includes automated exposure control, adjustment of the mA and/or kV according to patient size and/or use of iterative reconstruction technique. CONTRAST:  OMNIPAQUE IOHEXOL 300 MG/ML  SOLN COMPARISON:  Nuclear medicine PET dated 12/01/2023 FINDINGS: Lower chest: Near complete atelectasis of the left lung secondary to large left pneumothorax and trace pleural effusion. No substantial rightward mediastinal shift. Hepatobiliary: Subcentimeter hypodensity along peripheral segment 8 (4:23), too small to characterize but unchanged and likely a cyst. Go to adrenals nodular thickening of the left adrenal gland, previously characterized as an adenoma. No right adrenal nodule. Normal gallbladder. Pancreas: No focal lesions or main ductal dilation. Spleen: Normal in size without focal abnormality. Adrenals/Urinary Tract: No adrenal nodules. No suspicious renal mass, calculi or hydronephrosis. No focal  bladder wall thickening. Stomach/Bowel: Normal appearance of the stomach. No evidence of bowel wall thickening, distention, or inflammatory changes. Colonic diverticulosis without acute diverticulitis. Normal appendix. Vascular/Lymphatic: Aortic atherosclerosis. No enlarged abdominal or pelvic lymph nodes. Reproductive: Again seen are adjacent right adnexal simple-appearing cystic foci measuring up to 3.0 cm (4:67), not substantially changed dating back to 01/03/2020. No specific follow-up imaging recommended. No left adnexal mass. Other: No free fluid, fluid collection, or free air. Musculoskeletal: Minimally displaced tenth posterior left rib fracture (2:23). Cortical undulation of the left lateral ninth through eleventh ribs. Trace subcutaneous soft tissue stranding overlying the left posterior lower chest, likely soft tissue contusion. IMPRESSION: 1. Large left pneumothorax with near complete atelectasis of the left lung. No substantial rightward mediastinal shift. 2. Minimally displaced tenth posterior left rib fracture. Cortical undulation of the left lateral ninth through eleventh ribs, which may represent nondisplaced fractures. 3. No acute abnormality in the abdomen or pelvis. 4.  Aortic  Atherosclerosis (ICD10-I70.0). Critical Value/emergent results were called by telephone at the time of interpretation on 01/09/2024 at 8:40 am to provider Estelle June, who verbally acknowledged these results. Electronically Signed   By: Agustin Cree M.D.   On: 01/09/2024 08:54    Procedures CHEST TUBE INSERTION  Date/Time: 01/09/2024 9:52 AM  Performed by: Gareth Eagle, PA-C Authorized by: Gareth Eagle, PA-C   Consent:    Consent obtained:  Verbal   Consent given by:  Patient   Risks, benefits, and alternatives were discussed: yes     Risks discussed:  Bleeding and damage to surrounding structures   Alternatives discussed:  No treatment Universal protocol:    Procedure explained and questions answered  to patient or proxy's satisfaction: yes     Relevant documents present and verified: yes     Patient identity confirmed:  Verbally with patient and arm band Pre-procedure details:    Skin preparation:  Povidone-iodine   Preparation: Patient was prepped and draped in the usual sterile fashion   Sedation:    Sedation type:  Moderate sedation (1 mg IV ativan) Anesthesia:    Anesthesia method:  Local infiltration (also 50 mcg of fentanyl)   Local anesthetic:  Lidocaine 1% w/o epi Procedure details:    Placement location:  L lateral   Scalpel size:  11   Tube size ([place]): 14.   Ultrasound guidance: no     Tension pneumothorax: no     Tube connected to:  Water seal   Drainage characteristics:  Air only   Suture material:  0 silk   Dressing: tegaderm dressing. Post-procedure details:    Procedure completion:  Tolerated well, no immediate complications     Medications Ordered in ED Medications  atorvastatin (LIPITOR) tablet 20 mg (has no administration in time range)  rivastigmine (EXELON) 9.5 mg/24hr 9.5 mg (has no administration in time range)  famotidine (PEPCID) tablet 20 mg (has no administration in time range)  enoxaparin (LOVENOX) injection 40 mg (has no administration in time range)  acetaminophen (TYLENOL) tablet 650 mg (has no administration in time range)    Or  acetaminophen (TYLENOL) suppository 650 mg (has no administration in time range)  HYDROcodone-acetaminophen (NORCO/VICODIN) 5-325 MG per tablet 1-2 tablet (has no administration in time range)  traZODone (DESYREL) tablet 25 mg (has no administration in time range)  ondansetron (ZOFRAN) tablet 4 mg (has no administration in time range)    Or  ondansetron (ZOFRAN) injection 4 mg (has no administration in time range)  albuterol (PROVENTIL) (2.5 MG/3ML) 0.083% nebulizer solution 2.5 mg (has no administration in time range)  ondansetron (ZOFRAN) injection 4 mg (4 mg Intravenous Given 01/09/24 0653)  sodium chloride  0.9 % bolus 1,000 mL (1,000 mLs Intravenous New Bag/Given 01/09/24 0652)  iohexol (OMNIPAQUE) 300 MG/ML solution 100 mL (100 mLs Intravenous Contrast Given 01/09/24 0741)  fentaNYL (SUBLIMAZE) injection 50 mcg (50 mcg Intravenous Given 01/09/24 0901)  LORazepam (ATIVAN) injection 1 mg (1 mg Intravenous Given 01/09/24 0913)    ED Course/ Medical Decision Making/ A&P Clinical Course as of 01/09/24 1027  Fri Jan 09, 2024  0829 Per my read of the CT scan, there was concern for left-sided pneumothorax.  Ordered chest x-ray which revealed considerable left-sided pneumothorax.  Placed patient on nonrebreather.  Awaiting pulm consult. [JR]    Clinical Course User Index [JR] Gareth Eagle, PA-C  Medical Decision Making Amount and/or Complexity of Data Reviewed Radiology: ordered.  Risk Prescription drug management.   Initial Impression and Ddx 73 yo well female presenting for left flank pain.  Exam notable for left CVA tenderness.  DDx includes kidney stone, UTI, colitis, diverticulitis, PE, other. Patient PMH that increases complexity of ED encounter:  lung cancer s/p right lower lobectomy, COPD, dementia   Interpretation of Diagnostics - I independent reviewed and interpreted the labs as followed: leukocytosis  - I independently visualized the following imaging with scope of interpretation limited to determining acute life threatening conditions related to emergency care: cxr, which revealed large left-sided pneumothorax and multiple left-sided rib fractures  Patient Reassessment and Ultimate Disposition/Management Workup revealing left-sided pneumothorax.  At this time cannot discern whether etiology is spontaneous or traumatic given rib fractures on the x-ray, patient unsure if she has fallen recently.  Discussed findings with Janeann Forehand, PA with pulmonary and critical care.  She agreed that placing pigtail chest tube would be adequate treatment.   Procedure went well without complication.  On post x-ray, distal tip of the chest tube was in the pleural space but appeared that there were 2 fenestrations in the subcu tissue with subcutaneous air.  I reconsulted PA Harris and she advised that she would come down evaluate.  The lung fortunately does look reexpanded.  Admitted to hospital service with Dr. Kirby Crigler.  We initially placed on a nonrebreather prophylactically before the procedure.  Was able to wean to room air after procedure.  She remains hemodynamically stable and in no respiratory distress.  Patient management required discussion with the following services or consulting groups:  Hospitalist Service and Intensivist Service  Complexity of Problems Addressed Acute complicated illness or Injury  Additional Data Reviewed and Analyzed Further history obtained from: Further history from spouse/family member and Past medical history and medications listed in the EMR  Patient Encounter Risk Assessment Consideration of hospitalization         Final Clinical Impression(s) / ED Diagnoses Final diagnoses:  Pneumothorax, unspecified type    Rx / DC Orders ED Discharge Orders     None         Gareth Eagle, PA-C 01/09/24 1027    Gilda Crease, MD 01/14/24 [phone]

## 2024-01-09 NOTE — ED Triage Notes (Signed)
 BIB EMS from home with c/o sharp non-radiating 8/10 RLQ abd pain x 90 min, woke her up from sleep. Hx of lung ca, not currently on treatment. Pt denies n/v/d/c or difficulty urinating. Pt is alert to self, situation, but not to place. Baseline mentation per son and husband.

## 2024-01-09 NOTE — Consult Note (Signed)
 NAME:  Lindsay Mason, MRN:  [id number], DOB:  1951/05/05, LOS: 0 ADMISSION DATE:  01/09/2024, CONSULTATION DATE:  01/09/2024 REFERRING MD:  Dr. Rennis Harding - EDP, CHIEF COMPLAINT: Abdominal pain  History of Present Illness:  Lindsay Mason is a 73 year old female with a past medical history significant for lung cancer s/p right lower lobectomy, COPD, ex-smoker, and early onset dementia who presented to the ED at [facility] 3/28 for complaints of abdominal pain.  Per patient pain started around 1 AM morning of admission and reports pain originates in the left flank and radiates to the left lower quadrant.  She reports 1 episode of vomiting this a.m. with normal bowel movements.  Also reports intermittent shortness of breath but no chest pain.  On ED arrival patient was seen hemodynamically stable with only mild tachypnea seen.  Lab work relatively unremarkable apart from mild leukocytosis with WBC 11.6.  Chest x-ray obtained and revealed large left pneumothorax with near complete atelectasis of left lung, EDP placed smallbore chest tube.  PCCM consulted for further management of chest tube.  Pertinent  Medical History  Lung cancer s/p right lower lobectomy, COPD, ex-smoker, and early onset dementia  Significant Hospital Events: Including procedures, antibiotic start and stop dates in addition to other pertinent events   3/28 presented for complaints of abdominal pain, found to have large left pneumothorax  Interim History / Subjective:  Sleepy post chest tube placement but awakens to verbal stimuli  Objective   Blood pressure 126/89, pulse 87, temperature 97.7 F (36.5 C), temperature source Oral, resp. rate (!) 23, SpO2 99%.       No intake or output data in the 24 hours ending 01/09/24 1014 There were no vitals filed for this visit.  Examination: General: Acute on chronic ill-appearing slightly deconditioned elderly female lying in bed in no acute distress HEENT: /AT, MM pink/moist,  PERRL,  Neuro: Awakens to verbal stimuli but sedated post as needed opioids for chest tube placement CV: s1s2 regular rate and rhythm, no murmur, rubs, or gallops,  PULM: Clear to auscultation bilaterally, no increased work of breathing, no added breath sounds GI: soft, bowel sounds active in all 4 quadrants, non-tender, non-distended Extremities: warm/dry, no edema  Skin: no rashes or lesions   Resolved Hospital Problem list     Assessment & Plan:  Large left pneumothorax likely secondary to trauma Multiple rib fractures -CT abdomen identified minimally displaced 10th posterior rib fracture with nondisplaced 9th through 11th rib fractures as well -Family denies any known recent falls but states it would not be uncommon for patient to have fallen and not told family with underlying mild dementia 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 Cliffton Asters -On surveillance imaging it appears patient has a new LLL nodule that appears to have same PET appearance to RLL with adenocarcinoma.  Patient has been referred to radiation oncology for possible SBRT, appointment is scheduled with radiation oncology for April 1 P: Chest tube placed by EDP to slightly shallow, advanced in ED Repeat chest x-ray pending Routine chest tube care Consider clamping trial tomorrow Ensure adequate pain control Pulmonary hygiene Mobilize as able PT/OT evaluation   Best Practice (right click and "Reselect all SmartList Selections" daily)  Per primary   Labs   CBC: Recent Labs  Lab 01/09/24 0626  WBC 11.6*  NEUTROABS 10.6*  HGB 12.3  HCT 38.5  MCV 97.2  PLT 187    Basic Metabolic Panel: Recent Labs  Lab  01/09/24 0626  NA 138  K 3.6  CL 101  CO2 26  GLUCOSE 144*  BUN 20  CREATININE 0.79  CALCIUM 9.1   GFR: Estimated Creatinine Clearance: 56.3 mL/min (by C-G formula based on SCr of 0.79 mg/dL). Recent Labs  Lab 01/09/24 0626  WBC 11.6*    Liver Function  Tests: Recent Labs  Lab 01/09/24 0626  AST 24  ALT 23  ALKPHOS 61  BILITOT 0.7  PROT 6.7  ALBUMIN 4.2   Recent Labs  Lab 01/09/24 0626  LIPASE 50   No results for input(s): "AMMONIA" in the last 168 hours.  ABG    Component Value Date/Time   PHART 7.241 (L) 01/20/2020 1149   PCO2ART 62.7 (H) 01/20/2020 1149   PO2ART 88.0 01/20/2020 1149   HCO3 27.0 01/20/2020 1149   TCO2 29 01/20/2020 1149   ACIDBASEDEF 2.0 01/20/2020 1149   O2SAT 95.0 01/20/2020 1149     Coagulation Profile: No results for input(s): "INR", "PROTIME" in the last 168 hours.  Cardiac Enzymes: No results for input(s): "CKTOTAL", "CKMB", "CKMBINDEX", "TROPONINI" in the last 168 hours.  HbA1C: No results found for: "HGBA1C"  CBG: No results for input(s): "GLUCAP" in the last 168 hours.  Review of Systems:   Please see the history of present illness. 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   Surgical History:   Past Surgical History:  Procedure Laterality Date   CATARACT EXTRACTION W/PHACO Right 02/21/2021   Procedure: CATARACT EXTRACTION PHACO AND INTRAOCULAR LENS PLACEMENT (IOC) RIGHT;  Surgeon: Lockie Mola, MD;  Location: [facility];  Service: Ophthalmology;  Laterality: Right;  cde 4.75 00:41.1 minutes 11.5%   CATARACT EXTRACTION W/PHACO Left 03/14/2021   Procedure: CATARACT EXTRACTION PHACO AND INTRAOCULAR LENS PLACEMENT (IOC) LEFT 4.07 00:44.6;  Surgeon: Lockie Mola, MD;  Location: [facility];  Service: Ophthalmology;  Laterality: Left;   COLONOSCOPY  06/2015   microscopic colitis Christella Hartigan)   DEXA  10/2008    Osteoporosis   DEXA  02/2014   T score osteopenia hip, osteoporosis spine, scoliosis   ELECTROMAGNETIC NAVIGATION BROCHOSCOPY  01/20/2020   Procedure: ELECTROMAGNETIC NAVIGATION BRONCHOSCOPY;  Surgeon: Josephine Igo, DO;  Location: [facility];  Service: Pulmonary;;   ETT  10/2011   WNL, no evidence of ischemia, excellent exercise tolerance   EXPLORATORY LAPAROTOMY     INTERCOSTAL NERVE BLOCK Right 01/20/2020   Procedure: Intercostal Nerve Block;  Surgeon: Corliss Skains, MD;  Location: [facility] OR;  Service: Thoracic;  Laterality: Right;   MRI head  07/2011   multiple foci deep and subcortical white matter, chronic ischemic vs demyelinating, no active disease   NODE DISSECTION  01/20/2020   Procedure: Node Dissection;  Surgeon: Corliss Skains, MD;  Location: [facility] OR;  Service: Thoracic;;   SUBMUCOSAL TATTOO INJECTION  01/20/2020   Procedure: SUBSTITIAL TATTOO INJECTION;  Surgeon: Josephine Igo, DO;  Location: [facility];  Service: Pulmonary;;   TONSILLECTOMY AND ADENOIDECTOMY     Vein removal     removed from legs   VIDEO ASSISTED THORACOSCOPY (VATS)/WEDGE RESECTION Right 01/20/2020   XI ROBOTIC ASSISTED THORASCOPY-RIGHT LOWER LOBE WEDGE RESECTION, RIGHT LOWER LOBECTOMYRightGeneral   VIDEO BRONCHOSCOPY  01/20/2020   Procedure: VIDEO  BRONCHOSCOPY WITH FLUORO;  Surgeon: Josephine Igo, DO;  Location: [facility];  Service: Pulmonary;;     Social History:   reports that she quit smoking about 8 years ago. Her smoking use included cigarettes. She started smoking about 45 years ago. She has a 36.7 pack-year smoking history. She has never used smokeless tobacco. She reports current alcohol use. She reports that she does not use drugs.   Family History:  Her family history includes Coronary artery disease (age of onset: 89) in her maternal grandfather; Coronary artery disease (age of onset: 72) in her paternal grandfather and paternal grandmother; Diabetes in her maternal grandfather;  Lung cancer in her maternal aunt; Osteoporosis in an other family member; Stroke in her maternal grandmother.   Allergies Allergies  Allergen Reactions   Penicillins Swelling    REACTION: swelling tongue - States has taken Cephalosporins without difficulty in past Did it involve swelling of the face/tongue/throat, SOB, or low BP? Yes Did it involve sudden or severe rash/hives, skin peeling, or any reaction on the inside of your mouth or nose? No Did you need to seek medical attention at a hospital or doctor's office? Yes When did it last happen? Childhood reaction If all above answers are "NO", may proceed with cephalosporin use.      Home Medications  Prior to Admission medications   Medication Sig Start Date End Date Taking? Authorizing Provider  albuterol (VENTOLIN HFA) 108 (90 Base) MCG/ACT inhaler Inhale 2 puffs into the lungs every 6 (six) hours as needed for wheezing or shortness of breath. 08/15/22   Icard, Rachel Bo, DO  atorvastatin (LIPITOR) 20 MG tablet Take 1 tablet (20 mg total) by mouth daily. 12/10/23   Eustaquio Boyden, MD  cyanocobalamin (VITAMIN B12) 1000 MCG tablet Take 1 tablet (1,000 mcg total) by mouth daily. 11/01/22   Eustaquio Boyden, MD  famotidine (PEPCID) 20 MG tablet Take 1 tablet (20 mg total) by mouth daily as needed for heartburn or indigestion. 12/05/23   Eustaquio Boyden, MD  Multiple Vitamin (MULTIVITAMIN) tablet Take 1 tablet by mouth daily.    [provider]  rivastigmine (EXELON) 4.6 mg/24hr Place 1 patch (4.6 mg total) onto the skin daily. 12/05/23   Eustaquio Boyden, MD  rivastigmine (EXELON) 9.5 mg/24hr Place 1 patch (9.5 mg total) onto the skin daily. 12/26/23   Eustaquio Boyden, MD     Critical care time: NA  Donne Baley D. Harris, NP-C  Pulmonary & Critical Care Personal contact information can be found on Amion  If no contact or response made please call [phone] 01/09/2024, 11:14 AM

## 2024-01-10 ENCOUNTER — Observation Stay (HOSPITAL_COMMUNITY)

## 2024-01-10 ENCOUNTER — Inpatient Hospital Stay (HOSPITAL_COMMUNITY)

## 2024-01-10 DIAGNOSIS — E876 Hypokalemia: Secondary | ICD-10-CM | POA: Diagnosis not present

## 2024-01-10 DIAGNOSIS — Z8679 Personal history of other diseases of the circulatory system: Secondary | ICD-10-CM | POA: Diagnosis not present

## 2024-01-10 DIAGNOSIS — R0602 Shortness of breath: Secondary | ICD-10-CM | POA: Diagnosis not present

## 2024-01-10 DIAGNOSIS — R509 Fever, unspecified: Secondary | ICD-10-CM | POA: Diagnosis not present

## 2024-01-10 DIAGNOSIS — E785 Hyperlipidemia, unspecified: Secondary | ICD-10-CM | POA: Diagnosis not present

## 2024-01-10 DIAGNOSIS — J9311 Primary spontaneous pneumothorax: Secondary | ICD-10-CM | POA: Diagnosis not present

## 2024-01-10 DIAGNOSIS — N390 Urinary tract infection, site not specified: Secondary | ICD-10-CM | POA: Diagnosis not present

## 2024-01-10 DIAGNOSIS — I739 Peripheral vascular disease, unspecified: Secondary | ICD-10-CM | POA: Diagnosis not present

## 2024-01-10 DIAGNOSIS — Z880 Allergy status to penicillin: Secondary | ICD-10-CM | POA: Diagnosis not present

## 2024-01-10 DIAGNOSIS — Z8249 Family history of ischemic heart disease and other diseases of the circulatory system: Secondary | ICD-10-CM | POA: Diagnosis not present

## 2024-01-10 DIAGNOSIS — K219 Gastro-esophageal reflux disease without esophagitis: Secondary | ICD-10-CM | POA: Diagnosis not present

## 2024-01-10 DIAGNOSIS — S2242XD Multiple fractures of ribs, left side, subsequent encounter for fracture with routine healing: Secondary | ICD-10-CM | POA: Diagnosis not present

## 2024-01-10 DIAGNOSIS — R2681 Unsteadiness on feet: Secondary | ICD-10-CM | POA: Diagnosis not present

## 2024-01-10 DIAGNOSIS — Z4682 Encounter for fitting and adjustment of non-vascular catheter: Secondary | ICD-10-CM | POA: Diagnosis not present

## 2024-01-10 DIAGNOSIS — Z7401 Bed confinement status: Secondary | ICD-10-CM | POA: Diagnosis not present

## 2024-01-10 DIAGNOSIS — M810 Age-related osteoporosis without current pathological fracture: Secondary | ICD-10-CM | POA: Diagnosis not present

## 2024-01-10 DIAGNOSIS — F03A4 Unspecified dementia, mild, with anxiety: Secondary | ICD-10-CM | POA: Diagnosis not present

## 2024-01-10 DIAGNOSIS — Z85118 Personal history of other malignant neoplasm of bronchus and lung: Secondary | ICD-10-CM | POA: Diagnosis not present

## 2024-01-10 DIAGNOSIS — E538 Deficiency of other specified B group vitamins: Secondary | ICD-10-CM | POA: Diagnosis not present

## 2024-01-10 DIAGNOSIS — I771 Stricture of artery: Secondary | ICD-10-CM | POA: Diagnosis not present

## 2024-01-10 DIAGNOSIS — R079 Chest pain, unspecified: Secondary | ICD-10-CM | POA: Diagnosis not present

## 2024-01-10 DIAGNOSIS — J9811 Atelectasis: Secondary | ICD-10-CM | POA: Diagnosis not present

## 2024-01-10 DIAGNOSIS — R339 Retention of urine, unspecified: Secondary | ICD-10-CM | POA: Diagnosis not present

## 2024-01-10 DIAGNOSIS — S270XXA Traumatic pneumothorax, initial encounter: Secondary | ICD-10-CM | POA: Diagnosis not present

## 2024-01-10 DIAGNOSIS — F03A18 Unspecified dementia, mild, with other behavioral disturbance: Secondary | ICD-10-CM | POA: Diagnosis not present

## 2024-01-10 DIAGNOSIS — S2242XA Multiple fractures of ribs, left side, initial encounter for closed fracture: Secondary | ICD-10-CM | POA: Diagnosis not present

## 2024-01-10 DIAGNOSIS — J439 Emphysema, unspecified: Secondary | ICD-10-CM | POA: Diagnosis not present

## 2024-01-10 DIAGNOSIS — G309 Alzheimer's disease, unspecified: Secondary | ICD-10-CM | POA: Diagnosis not present

## 2024-01-10 DIAGNOSIS — Z902 Acquired absence of lung [part of]: Secondary | ICD-10-CM | POA: Diagnosis not present

## 2024-01-10 DIAGNOSIS — T797XXA Traumatic subcutaneous emphysema, initial encounter: Secondary | ICD-10-CM | POA: Diagnosis not present

## 2024-01-10 DIAGNOSIS — R531 Weakness: Secondary | ICD-10-CM | POA: Diagnosis not present

## 2024-01-10 DIAGNOSIS — F05 Delirium due to known physiological condition: Secondary | ICD-10-CM | POA: Diagnosis not present

## 2024-01-10 DIAGNOSIS — R918 Other nonspecific abnormal finding of lung field: Secondary | ICD-10-CM | POA: Diagnosis not present

## 2024-01-10 DIAGNOSIS — J939 Pneumothorax, unspecified: Secondary | ICD-10-CM | POA: Diagnosis not present

## 2024-01-10 DIAGNOSIS — Z823 Family history of stroke: Secondary | ICD-10-CM | POA: Diagnosis not present

## 2024-01-10 DIAGNOSIS — M6281 Muscle weakness (generalized): Secondary | ICD-10-CM | POA: Diagnosis not present

## 2024-01-10 DIAGNOSIS — Z79899 Other long term (current) drug therapy: Secondary | ICD-10-CM | POA: Diagnosis not present

## 2024-01-10 DIAGNOSIS — J449 Chronic obstructive pulmonary disease, unspecified: Secondary | ICD-10-CM | POA: Diagnosis not present

## 2024-01-10 DIAGNOSIS — D72829 Elevated white blood cell count, unspecified: Secondary | ICD-10-CM | POA: Diagnosis not present

## 2024-01-10 DIAGNOSIS — Z743 Need for continuous supervision: Secondary | ICD-10-CM | POA: Diagnosis not present

## 2024-01-10 DIAGNOSIS — X58XXXA Exposure to other specified factors, initial encounter: Secondary | ICD-10-CM | POA: Diagnosis not present

## 2024-01-10 DIAGNOSIS — R2689 Other abnormalities of gait and mobility: Secondary | ICD-10-CM | POA: Diagnosis not present

## 2024-01-10 DIAGNOSIS — Z87891 Personal history of nicotine dependence: Secondary | ICD-10-CM | POA: Diagnosis not present

## 2024-01-10 DIAGNOSIS — R911 Solitary pulmonary nodule: Secondary | ICD-10-CM | POA: Diagnosis not present

## 2024-01-10 LAB — BASIC METABOLIC PANEL WITH GFR
Anion gap: 6 (ref 5–15)
BUN: 13 mg/dL (ref 8–23)
CO2: 29 mmol/L (ref 22–32)
Calcium: 9.1 mg/dL (ref 8.9–10.3)
Chloride: 107 mmol/L (ref 98–111)
Creatinine, Ser: 0.6 mg/dL (ref 0.44–1.00)
GFR, Estimated: >60 mL/min (ref >60)
Glucose, Bld: 98 mg/dL (ref 70–99)
Potassium: 3.9 mmol/L (ref 3.5–5.1)
Sodium: 142 mmol/L (ref 135–145)

## 2024-01-10 LAB — CBC
HCT: 38.7 % (ref 36.0–46.0)
Hemoglobin: 11.9 g/dL — ABNORMAL LOW (ref 12.0–15.0)
MCH: 31.2 pg (ref 26.0–34.0)
MCHC: 30.7 g/dL (ref 30.0–36.0)
MCV: 101.6 fL — ABNORMAL HIGH (ref 80.0–100.0)
Platelets: 142 10*3/uL — ABNORMAL LOW (ref 150–400)
RBC: 3.81 MIL/uL — ABNORMAL LOW (ref 3.87–5.11)
RDW: 14.6 % (ref 11.5–15.5)
WBC: 9.1 10*3/uL (ref 4.0–10.5)
nRBC: 0 % (ref 0.0–0.2)

## 2024-01-10 MED ORDER — SENNOSIDES-DOCUSATE SODIUM 8.6-50 MG PO TABS
1.0000 | ORAL_TABLET | Freq: Every evening | ORAL | Status: DC | PRN
  Administered 2024-01-14: 1 via ORAL
  Filled 2024-01-10: qty 1

## 2024-01-10 MED ORDER — VITAMIN B-12 1000 MCG PO TABS
1000.0000 ug | ORAL_TABLET | Freq: Every day | ORAL | Status: DC
  Administered 2024-01-10 – 2024-01-17 (×8): 1000 ug via ORAL
  Filled 2024-01-10 (×8): qty 1

## 2024-01-10 MED ORDER — LIDOCAINE 5 % EX PTCH
2.0000 | MEDICATED_PATCH | CUTANEOUS | Status: DC
  Administered 2024-01-10 – 2024-01-17 (×7): 2 via TRANSDERMAL
  Filled 2024-01-10 (×7): qty 2

## 2024-01-10 MED ORDER — HALOPERIDOL LACTATE 5 MG/ML IJ SOLN
2.0000 mg | Freq: Once | INTRAMUSCULAR | Status: AC
Start: 2024-01-10 — End: 2024-01-10
  Administered 2024-01-10: 2 mg via INTRAVENOUS
  Filled 2024-01-10: qty 1

## 2024-01-10 MED ORDER — GUAIFENESIN 100 MG/5ML PO LIQD: 5.0000 mL | ORAL | Status: DC | PRN

## 2024-01-10 MED ORDER — METOPROLOL TARTRATE 5 MG/5ML IV SOLN: 5.0000 mg | INTRAVENOUS | Status: DC | PRN

## 2024-01-10 MED ORDER — BOOST / RESOURCE BREEZE PO LIQD CUSTOM
1.0000 | Freq: Three times a day (TID) | ORAL | Status: DC
  Administered 2024-01-10 – 2024-01-17 (×10): 1 via ORAL

## 2024-01-10 MED ORDER — HYDRALAZINE HCL 20 MG/ML IJ SOLN: 10.0000 mg | INTRAMUSCULAR | Status: DC | PRN

## 2024-01-10 MED ORDER — ADULT MULTIVITAMIN W/MINERALS CH
1.0000 | ORAL_TABLET | Freq: Every day | ORAL | Status: DC
  Administered 2024-01-10 – 2024-01-17 (×8): 1 via ORAL
  Filled 2024-01-10 (×8): qty 1

## 2024-01-10 MED ORDER — OLANZAPINE 10 MG IM SOLR
2.5000 mg | Freq: Three times a day (TID) | INTRAMUSCULAR | Status: DC | PRN
  Administered 2024-01-10 – 2024-01-11 (×2): 2.5 mg via INTRAMUSCULAR
  Filled 2024-01-10 (×6): qty 10

## 2024-01-10 MED ORDER — HYDROXYZINE HCL 10 MG PO TABS
10.0000 mg | ORAL_TABLET | Freq: Three times a day (TID) | ORAL | Status: DC | PRN
  Administered 2024-01-10 – 2024-01-17 (×10): 10 mg via ORAL
  Filled 2024-01-10 (×10): qty 1

## 2024-01-10 NOTE — Progress Notes (Signed)
 RRT attempted to provide education to patient regarding use of flutter valve and incentive spirometry. Patient refused. Nursing staff will attempt education in the near future. Patient was agreeable to nebulizer treatment. Family is bedside and is aware.

## 2024-01-10 NOTE — Progress Notes (Signed)
 PROGRESS NOTE    Lindsay Mason  ZOX:[id number] DOB: Nov 25, 1950 DOA: 01/09/2024 PCP: Eustaquio Boyden, MD    Brief Narrative:  73 year old with history of tobacco use, COPD on room air, mild dementia, right lower lobectomy admitted to the hospital for left-sided pneumothorax.  Patient was seen by pulmonary team, chest tube was inserted.   Assessment & Plan:  Active Problems:   * No active hospital problems. 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 - Chest tube placed by pulmonary, management per their service - Clinically follow and repeat chest x-ray - Has outpatient radiation oncology appointment on April 1 which she can follow  History of COPD - Bronchodilators as needed  History of GERD - As needed Pepcid  Dementia - On outpatient rivastigmine  Hyperlipidemia -Statin   DVT prophylaxis: enoxaparin (LOVENOX) injection 40 mg Start: 01/09/24 2200    Code Status: Full Code Family Communication: Husband at bedside. Called [facility] Continue hospital stay for chest tube management   Subjective: Seen at bedside.  Patient is pleasantly confused.  Received pain medication therefore she was drowsy Husband is also present at bedside Patient had been disconnected chest tube this morning, pulmonary assisting with this   Examination:  General exam: Appears calm and comfortable drowsy Respiratory system: Clear to auscultation. Respiratory effort normal. Cardiovascular system: S1 & S2 heard, RRR. No JVD, murmurs, rubs, gallops or clicks. No pedal edema. Gastrointestinal system: Abdomen is nondistended, soft and nontender. No organomegaly or masses felt. Normal bowel sounds heard. Central nervous system: Alert and oriented to name Extremities: Symmetric 5 x 5 power. Skin: No rashes, lesions or  ulcers Psychiatry: Judgement and insight appea poor Chest tube in place               Diet Orders (From admission, onward)     Start     Ordered   01/09/24 1020  Diet regular Room service appropriate? Yes; Fluid consistency: Thin  Diet effective now       Question Answer Comment  Room service appropriate? Yes   Fluid consistency: Thin      01/09/24 1019            Objective: Vitals:   01/09/24 1831 01/09/24 2053 01/10/24 0523 01/10/24 0840  BP:  111/70 138/78 122/73  Pulse:  86 72 68  Resp:  20 20 16   Temp:  98.7 F (37.1 C) 98.4 F (36.9 C) 97.8 F (36.6 C)  TempSrc:  Oral Oral Axillary  SpO2:  99% 100% 100%  Weight: 68.7 kg     Height: 5\' 2"  (1.575 m)       Intake/Output Summary (Last 24 hours) at 01/10/2024 1022 Last data filed at 01/10/2024 0612 Gross per 24 hour  Intake 100 ml  Output 1350 ml  Net -1250 ml   Filed Weights   01/09/24 1831  Weight: 68.7 kg    Scheduled Meds:  atorvastatin  20 mg Oral Daily   cyanocobalamin  1,000 mcg Oral Daily   enoxaparin (LOVENOX) injection  40 mg Subcutaneous QHS   multivitamin with minerals  1 tablet Oral Daily   rivastigmine  9.5 mg Transdermal Daily   Continuous Infusions:  Nutritional status     Body mass index is 27.7 kg/m.  Data Reviewed:   CBC: Recent Labs  Lab 01/09/24 0626 01/10/24 0411  WBC 11.6* 9.1  NEUTROABS 10.6*  --   HGB 12.3 11.9*  HCT 38.5 38.7  MCV 97.2 101.6*  PLT 187 142*   Basic Metabolic Panel: Recent Labs  Lab 01/09/24 0626  NA 138  K 3.6  CL 101  CO2 26  GLUCOSE 144*  BUN 20  CREATININE 0.79  CALCIUM 9.1   GFR: Estimated Creatinine Clearance: 57.7 mL/min (by C-G formula based on SCr of 0.79 mg/dL). Liver Function Tests: Recent Labs  Lab 01/09/24 0626  AST 24  ALT 23  ALKPHOS 61  BILITOT 0.7  PROT 6.7  ALBUMIN 4.2   Recent Labs  Lab 01/09/24 0626  LIPASE 50   No results for input(s): "AMMONIA" in the last 168 hours. Coagulation  Profile: No results for input(s): "INR", "PROTIME" in the last 168 hours. Cardiac Enzymes: No results for input(s): "CKTOTAL", "CKMB", "CKMBINDEX", "TROPONINI" in the last 168 hours. BNP (last 3 results) No results for input(s): "PROBNP" in the last 8760 hours. HbA1C: No results for input(s): "HGBA1C" in the last 72 hours. CBG: No results for input(s): "GLUCAP" in the last 168 hours. Lipid Profile: No results for input(s): "CHOL", "HDL", "LDLCALC", "TRIG", "CHOLHDL", "LDLDIRECT" in the last 72 hours. Thyroid Function Tests: No results for input(s): "TSH", "T4TOTAL", "FREET4", "T3FREE", "THYROIDAB" in the last 72 hours. Anemia Panel: No results for input(s): "VITAMINB12", "FOLATE", "FERRITIN", "TIBC", "IRON", "RETICCTPCT" in the last 72 hours. Sepsis Labs: No results for input(s): "PROCALCITON", "LATICACIDVEN" in the last 168 hours.  No results found for this or any previous visit (from the past 240 hours).       Radiology Studies: DG CHEST PORT 1 VIEW Result Date: 01/10/2024 CLINICAL DATA:  Follow-up chest tube placement EXAM: PORTABLE CHEST 1 VIEW COMPARISON:  01/09/2024 FINDINGS: Left-sided pigtail thoracostomy tube is noted with pigtail overlying the medial left upper lobe. Unchanged small residual left apical pneumothorax which measures roughly 1 cm. Small left pleural effusion and left base atelectasis. Left chest wall subcutaneous emphysema appears increased from previous exam. Heart size and mediastinal contours are stable. IMPRESSION: 1. Stable position of left-sided chest tube. Unchanged small residual left apical pneumothorax. 2. Small left pleural effusion and left base atelectasis. 3. Increased left chest wall subcutaneous emphysema. Electronically Signed   By: Signa Kell M.D.   On: 01/10/2024 08:41   DG CHEST PORT 1 VIEW Result Date: 01/09/2024 CLINICAL DATA:  Chest tube in place. EXAM: PORTABLE CHEST 1 VIEW COMPARISON:  Same day. FINDINGS: Stable cardiomegaly.  Left-sided chest tube has been advanced toward left lung apex. Small left apical pneumothorax is noted. Stable subcutaneous emphysema is seen over left lateral chest wall. Stable bibasilar subsegmental atelectasis. IMPRESSION: Left-sided chest tube has been advanced toward left lung apex. Small left apical pneumothorax is noted. Stable left-sided subcutaneous emphysema. Electronically Signed   By: Lupita Raider M.D.   On: 01/09/2024 12:48   DG Chest Portable 1 View Result Date: 01/09/2024 CLINICAL DATA:  Pneumothorax, post chest tube placement EXAM: PORTABLE CHEST - 1 VIEW COMPARISON:  Radiograph and CT from earlier the same day FINDINGS: Interval placement of pigtail chest catheter laterally at the left lung base. proximal sideholes project external to the thoracic cage, but there has been significant resolution of the pneumothorax, small residual suspected at the apex. There is a small amount of regional subcutaneous emphysema. Left infrahilar consolidation/atelectasis. Left lung clear. Heart size and mediastinal contours are within normal limits. Aortic Atherosclerosis (ICD10-170.0). No effusion. Visualized bones unremarkable. IMPRESSION: 1. Significant resolution of left pneumothorax following chest  tube placement. The chest tube side holes are partially external to the thoracic cage. 2. Left infrahilar consolidation/atelectasis. Electronically Signed   By: Corlis Leak M.D.   On: 01/09/2024 10:35   DG Chest Portable 1 View Result Date: 01/09/2024 CLINICAL DATA:  Suspected pneumothorax on CT scan EXAM: PORTABLE CHEST 1 VIEW COMPARISON:  Earlier same day CT abdomen and pelvis, chest radiograph dated 06/02/2020 FINDINGS: Patient is rotated to the right. Near-complete atelectasis of the left lung secondary to large left pneumothorax. The heart size and mediastinal contours are within normal limits. Minimally displaced tenth rib fracture is better seen on same day CT. IMPRESSION: Large left pneumothorax with  near-complete atelectasis of the left lung. Critical Value/emergent results were called by telephone at the time of interpretation on 01/09/2024 at 8:40 am to provider Estelle June, who verbally acknowledged these results. Electronically Signed   By: Agustin Cree M.D.   On: 01/09/2024 08:56   CT ABDOMEN PELVIS W CONTRAST Result Date: 01/09/2024 CLINICAL DATA:  Acute onset left-sided abdominal pain EXAM: CT ABDOMEN AND PELVIS WITH CONTRAST TECHNIQUE: Multidetector CT imaging of the abdomen and pelvis was performed using the standard protocol following bolus administration of intravenous contrast. RADIATION DOSE REDUCTION: This exam was performed according to the departmental dose-optimization program which includes automated exposure control, adjustment of the mA and/or kV according to patient size and/or use of iterative reconstruction technique. CONTRAST:  OMNIPAQUE IOHEXOL 300 MG/ML  SOLN COMPARISON:  Nuclear medicine PET dated 12/01/2023 FINDINGS: Lower chest: Near complete atelectasis of the left lung secondary to large left pneumothorax and trace pleural effusion. No substantial rightward mediastinal shift. Hepatobiliary: Subcentimeter hypodensity along peripheral segment 8 (4:23), too small to characterize but unchanged and likely a cyst. Go to adrenals nodular thickening of the left adrenal gland, previously characterized as an adenoma. No right adrenal nodule. Normal gallbladder. Pancreas: No focal lesions or main ductal dilation. Spleen: Normal in size without focal abnormality. Adrenals/Urinary Tract: No adrenal nodules. No suspicious renal mass, calculi or hydronephrosis. No focal bladder wall thickening. Stomach/Bowel: Normal appearance of the stomach. No evidence of bowel wall thickening, distention, or inflammatory changes. Colonic diverticulosis without acute diverticulitis. Normal appendix. Vascular/Lymphatic: Aortic atherosclerosis. No enlarged abdominal or pelvic lymph nodes. Reproductive:  Again seen are adjacent right adnexal simple-appearing cystic foci measuring up to 3.0 cm (4:67), not substantially changed dating back to 01/03/2020. No specific follow-up imaging recommended. No left adnexal mass. Other: No free fluid, fluid collection, or free air. Musculoskeletal: Minimally displaced tenth posterior left rib fracture (2:23). Cortical undulation of the left lateral ninth through eleventh ribs. Trace subcutaneous soft tissue stranding overlying the left posterior lower chest, likely soft tissue contusion. IMPRESSION: 1. Large left pneumothorax with near complete atelectasis of the left lung. No substantial rightward mediastinal shift. 2. Minimally displaced tenth posterior left rib fracture. Cortical undulation of the left lateral ninth through eleventh ribs, which may represent nondisplaced fractures. 3. No acute abnormality in the abdomen or pelvis. 4.  Aortic Atherosclerosis (ICD10-I70.0). Critical Value/emergent results were called by telephone at the time of interpretation on 01/09/2024 at 8:40 am to provider Estelle June, who verbally acknowledged these results. Electronically Signed   By: Agustin Cree M.D.   On: 01/09/2024 08:54           LOS: 0 days   Time spent= 35 mins    Miguel Rota, MD [facility]  If 7PM-7AM, please contact night-coverage  01/10/2024, 10:22 AM

## 2024-01-10 NOTE — Progress Notes (Signed)
 NAME:  Lindsay Mason, MRN:  [id number], DOB:  [birth year], LOS: 0 ADMISSION DATE:  01/09/2024, CONSULTATION DATE:  01/09/2024 REFERRING MD:  Dr. Rennis Harding - EDP, CHIEF COMPLAINT: Abdominal pain  History of Present Illness:  Lindsay Mason is a 73 year old female with a past medical history significant for lung cancer s/p right lower lobectomy, COPD, ex-smoker, and early onset dementia who presented to the ED at [facility] 3/28 for complaints of abdominal pain.  Per patient pain started around 1 AM morning of admission and reports pain originates in the left flank and radiates to the left lower quadrant.  She reports 1 episode of vomiting this a.m. with normal bowel movements.  Also reports intermittent shortness of breath but no chest pain.  On ED arrival patient was seen hemodynamically stable with only mild tachypnea seen.  Lab work relatively unremarkable apart from mild leukocytosis with WBC 11.6.  Chest x-ray obtained and revealed large left pneumothorax with near complete atelectasis of left lung, EDP placed smallbore chest tube.  PCCM consulted for further management of chest tube.  Pertinent  Medical History  Lung cancer s/p right lower lobectomy, COPD, ex-smoker, and early onset dementia  Significant Hospital Events: Including procedures, antibiotic start and stop dates in addition to other pertinent events   3/28 presented for complaints of abdominal pain, found to have large left pneumothorax  Interim History / Subjective:  Disconnected Chest tube, repeat CXR looks largely unchanged small to tiny apical PTX on left, more subcutaneous air likely related to disconnection  Objective   Blood pressure 122/73, pulse 68, temperature 97.8 F (36.6 C), temperature source Axillary, resp. rate 16, height 5\' 2"  (1.575 m), weight 68.7 kg, SpO2 100%.        Intake/Output Summary (Last 24 hours) at 01/10/2024 1054 Last data filed at 01/10/2024 2536 Gross per 24 hour  Intake 100 ml  Output 1350 ml   Net -1250 ml   Filed Weights   01/09/24 1831  Weight: 68.7 kg    Examination: General: Acute on chronic ill-appearing slightly deconditioned elderly female lying in bed in no acute distress HEENT: [place]/AT, MM pink/moist, PERRL,  Neuro: Alert, no focal deficits noted CV: s1s2 regular rate and rhythm, no murmur, rubs, or gallops,  PULM: Clear to auscultation bilaterally, no increased work of breathing, no added breath sounds GI: soft, bowel sounds active in all 4 quadrants, non-tender, non-distended Extremities: warm/dry, no edema  Skin: no rashes or lesions   Resolved Hospital Problem list     Assessment & Plan:  Large left pneumothorax likely secondary to trauma Multiple rib fractures -CT abdomen identified minimally displaced 10th posterior rib fracture with nondisplaced 9th through 11th rib fractures as well -Family denies any known recent falls but states it would not be uncommon for patient to have fallen and not told family with underlying mild dementia 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 Cliffton Asters -On surveillance imaging it appears patient has a new LLL nodule that appears to have same PET appearance to RLL with adenocarcinoma.  Patient has been referred to radiation oncology for possible SBRT, appointment is scheduled with radiation oncology for April 1 P: Chest tube in place and stable after disconnection Repeat chest x-ray stable tiny < 10 mm apical PTX, similar appearance on water seal Continue to water seal - no leak noted, consider clamping trial 3/30 Daily CXR while tube in place  PCCM will follow for chest tube management  Best Practice (right click  and "Reselect all SmartList Selections" daily)  Per primary   Labs   CBC: Recent Labs  Lab 01/09/24 0626 01/10/24 0411  WBC 11.6* 9.1  NEUTROABS 10.6*  --   HGB 12.3 11.9*  HCT 38.5 38.7  MCV 97.2 101.6*  PLT 187 142*    Basic Metabolic Panel: Recent Labs  Lab  01/09/24 0626  NA 138  K 3.6  CL 101  CO2 26  GLUCOSE 144*  BUN 20  CREATININE 0.79  CALCIUM 9.1   GFR: Estimated Creatinine Clearance: 57.7 mL/min (by C-G formula based on SCr of 0.79 mg/dL). Recent Labs  Lab 01/09/24 0626 01/10/24 0411  WBC 11.6* 9.1    Liver Function Tests: Recent Labs  Lab 01/09/24 0626  AST 24  ALT 23  ALKPHOS 61  BILITOT 0.7  PROT 6.7  ALBUMIN 4.2   Recent Labs  Lab 01/09/24 0626  LIPASE 50   No results for input(s): "AMMONIA" in the last 168 hours.  ABG    Component Value Date/Time   PHART 7.241 (L) 01/20/2020 1149   PCO2ART 62.7 (H) 01/20/2020 1149   PO2ART 88.0 01/20/2020 1149   HCO3 27.0 01/20/2020 1149   TCO2 29 01/20/2020 1149   ACIDBASEDEF 2.0 01/20/2020 1149   O2SAT 95.0 01/20/2020 1149     Coagulation Profile: No results for input(s): "INR", "PROTIME" in the last 168 hours.  Cardiac Enzymes: No results for input(s): "CKTOTAL", "CKMB", "CKMBINDEX", "TROPONINI" in the last 168 hours.  HbA1C: No results found for: "HGBA1C"  CBG: No results for input(s): "GLUCAP" in the last 168 hours.  Review of Systems:   Please see the history of present illness. 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   Surgical History:   Past Surgical History:  Procedure Laterality Date   CATARACT EXTRACTION W/PHACO Right 02/21/2021   Procedure: CATARACT EXTRACTION PHACO AND INTRAOCULAR LENS PLACEMENT (IOC) RIGHT;  Surgeon: Lockie Mola, MD;  Location: [facility];  Service: Ophthalmology;  Laterality: Right;  cde 4.75 00:41.1 minutes 11.5%   CATARACT EXTRACTION W/PHACO  Left 03/14/2021   Procedure: CATARACT EXTRACTION PHACO AND INTRAOCULAR LENS PLACEMENT (IOC) LEFT 4.07 00:44.6;  Surgeon: Lockie Mola, MD;  Location: [facility];  Service: Ophthalmology;  Laterality: Left;   COLONOSCOPY  06/2015   microscopic colitis Christella Hartigan)   DEXA  10/2008   Osteoporosis   DEXA  02/2014   T score osteopenia hip, osteoporosis spine, scoliosis   ELECTROMAGNETIC NAVIGATION BROCHOSCOPY  01/20/2020   Procedure: ELECTROMAGNETIC NAVIGATION BRONCHOSCOPY;  Surgeon: Josephine Igo, DO;  Location: [facility];  Service: Pulmonary;;   ETT  10/2011   WNL, no evidence of ischemia, excellent exercise tolerance   EXPLORATORY LAPAROTOMY     INTERCOSTAL NERVE BLOCK Right 01/20/2020   Procedure: Intercostal Nerve Block;  Surgeon: Corliss Skains, MD;  Location: [facility];  Service: Thoracic;  Laterality: Right;   MRI head  07/2011   multiple foci deep and subcortical white matter, chronic ischemic vs demyelinating, no active disease   NODE DISSECTION  01/20/2020   Procedure: Node Dissection;  Surgeon: Corliss Skains, MD;  Location: [facility];  Service: Thoracic;;   SUBMUCOSAL TATTOO INJECTION  01/20/2020   Procedure: SUBSTITIAL TATTOO INJECTION;  Surgeon: Josephine Igo, DO;  Location: [facility];  Service: Pulmonary;;   TONSILLECTOMY AND ADENOIDECTOMY     Vein removal     removed from legs   VIDEO ASSISTED THORACOSCOPY (VATS)/WEDGE RESECTION Right 01/20/2020   XI ROBOTIC ASSISTED THORASCOPY-RIGHT LOWER LOBE WEDGE RESECTION, RIGHT LOWER LOBECTOMYRightGeneral   VIDEO BRONCHOSCOPY  01/20/2020   Procedure: VIDEO BRONCHOSCOPY WITH FLUORO;  Surgeon: Josephine Igo, DO;  Location: [facility];  Service: Pulmonary;;     Social History:   reports that she quit smoking about 8 years ago. Her smoking use included cigarettes. She started smoking about 45 years ago. She has a 36.7 pack-year smoking history. She has never used smokeless tobacco. She reports current alcohol use. She  reports that she does not use drugs.   Family History:  Her family history includes Coronary artery disease (age of onset: 70) in her maternal grandfather; Coronary artery disease (age of onset: 3) in her paternal grandfather and paternal grandmother; Diabetes in her maternal grandfather; Lung cancer in her maternal aunt; Osteoporosis in an other family member; Stroke in her maternal grandmother.   Allergies Allergies  Allergen Reactions   Penicillins Swelling    REACTION: swelling tongue - States has taken Cephalosporins without difficulty in past     Home Medications  Prior to Admission medications   Medication Sig Start Date End Date Taking? Authorizing Provider  albuterol (VENTOLIN HFA) 108 (90 Base) MCG/ACT inhaler Inhale 2 puffs into the lungs every 6 (six) hours as needed for wheezing or shortness of breath. 08/15/22   Icard, Rachel Bo, DO  atorvastatin (LIPITOR) 20 MG tablet Take 1 tablet (20 mg total) by mouth daily. 12/10/23   Eustaquio Boyden, MD  cyanocobalamin (VITAMIN B12) 1000 MCG tablet Take 1 tablet (1,000 mcg total) by mouth daily. 11/01/22   Eustaquio Boyden, MD  famotidine (PEPCID) 20 MG tablet Take 1 tablet (20 mg total) by mouth daily as needed for heartburn or indigestion. 12/05/23   Eustaquio Boyden, MD  Multiple Vitamin (MULTIVITAMIN) tablet Take 1 tablet by mouth daily.    [provider]  rivastigmine (EXELON) 4.6 mg/24hr Place 1 patch (4.6 mg total) onto the skin daily. 12/05/23   Eustaquio Boyden, MD  rivastigmine (EXELON) 9.5 mg/24hr Place 1 patch (9.5 mg total) onto the skin daily. 12/26/23   Eustaquio Boyden, MD     Critical care time: NA  Karren Burly, MD [facility] Personal contact information can be found on Amion  If no contact or response made please call [phone] 01/10/2024, 10:54 AM

## 2024-01-10 NOTE — Plan of Care (Signed)

## 2024-01-10 NOTE — Progress Notes (Signed)
 Mobility Specialist - Progress Note   01/10/24 1443  Mobility  Activity Transferred to/from [facility]  Level of Assistance Contact guard assist, steadying assist  Assistive Device BSC  Range of Motion/Exercises Active Assistive  Activity Response Tolerated fair  Mobility visit 1 Mobility  Mobility Specialist Start Time (ACUTE ONLY) 1430  Mobility Specialist Stop Time (ACUTE ONLY) 1443  Mobility Specialist Time Calculation (min) (ACUTE ONLY) 13 min   RN requested assistance with transferring pt to [facility]. Required safety cues throughout session. Was left in bed with all needs met. Daughter in room.  Billey Chang Mobility Specialist

## 2024-01-10 NOTE — Plan of Care (Signed)
   Problem: Coping: Goal: Level of anxiety will decrease Outcome: Progressing   Problem: Pain Managment: Goal: General experience of comfort will improve and/or be controlled Outcome: Progressing   Problem: Safety: Goal: Ability to remain free from injury will improve Outcome: Progressing

## 2024-01-10 NOTE — Progress Notes (Signed)
 Mobility Specialist - Progress Note  ([place] 4L) Pre-mobility: 72% HR, 98% SpO2 During mobility: 84 bpm HR, 94% SpO2 Post-mobility: 59 bpm HR, 97% SPO2   01/10/24 1041  Mobility  Activity Transferred from bed to chair  Level of Assistance Moderate assist, patient does 50-74%  Assistive Device Front wheel walker  Range of Motion/Exercises Active Assistive  Activity Response Tolerated fair  Mobility Referral Yes  Mobility visit 1 Mobility  Mobility Specialist Start Time (ACUTE ONLY) 1023  Mobility Specialist Stop Time (ACUTE ONLY) 1041  Mobility Specialist Time Calculation (min) (ACUTE ONLY) 18 min   Pt was found in bed and agreeable to transfer to recliner chair. Requiring safety cues during session. At EOS was left on recliner chair with all needs met. Husband in room and RN notified.  Billey Chang Mobility Specialist

## 2024-01-10 NOTE — Hospital Course (Addendum)
 73 year old with history of tobacco use, COPD on room air, mild dementia, right lower lobectomy admitted to the hospital for left-sided pneumothorax.  Patient was seen by pulmonary team, chest tube was inserted.  Chest tube management per pulmonary team.  Eventually chest tube was removed on 3/31 which was uneventful.  Repeat chest x-ray was overall stable.  Foley catheter was required due to persistent urinary retention. Overall nonbreathing remained stable.  Occasionally she gets moments of anxiety and chest discomfort but this is mostly noncardiac.  She remains 100% on room air. PT/OT has recommended SNF, currently awaiting placement.  She had episodes of fever, workup unremarkable, given empiric antibiotics Foley discontinued, and voiding well.  She is stable for discharge to skilled nursing facility 01/17/24  Procedures/testing: Chest tube placement    Consultation: PCCM  Subjective: Seen this aao to self people Son at bedside Doing nwell Afebrile and doing well. 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  Respiratory status overall stable chest x-ray showing improvement further.  Continue pain management incentive spirometry PT OT Follow-up with rad Onc as OP.  Urinary retention s/p Foley catheter placed 3/31: Foley discontinued 4/4, voiding well continue Flomax   Fever w/ sore throat: Having low-grade fever now . SO FAR labs with mild leukocytosis otherwise stable-UA abnormal but likely in the setting of Foley placement Foley has been discontinued.  At this time patient is doing well afebrile. COVID RSV RVP negative. URINE CULTURE NEGATIVE.  Chest x-ray trace residual left apical pneumothorax but further decrease from previous persistent lung base  opacities persistent left-sided chest wall gas unchanged.  Complete empiric antibiotics   Hypokalemia: Resolved  COPD Not in exacerbation.  Continue nebs prn  GERD Cont prn pepcid  Dementia with behavioral disturbances Cont her rivastigmine , IV or PO zyprexa.  Off sitter 4/2 and doing well  Hyperlipidemia Cont statin  Deconditioning/debility: PT OT following, TOC consulted for skilled nursing facility

## 2024-01-11 ENCOUNTER — Inpatient Hospital Stay (HOSPITAL_COMMUNITY)

## 2024-01-11 DIAGNOSIS — S270XXA Traumatic pneumothorax, initial encounter: Secondary | ICD-10-CM | POA: Diagnosis not present

## 2024-01-11 DIAGNOSIS — J9311 Primary spontaneous pneumothorax: Secondary | ICD-10-CM | POA: Diagnosis not present

## 2024-01-11 LAB — MAGNESIUM: Magnesium: 1.9 mg/dL (ref 1.7–2.4)

## 2024-01-11 LAB — BASIC METABOLIC PANEL WITH GFR
Anion gap: 6 (ref 5–15)
BUN: 13 mg/dL (ref 8–23)
CO2: 31 mmol/L (ref 22–32)
Calcium: 8.8 mg/dL — ABNORMAL LOW (ref 8.9–10.3)
Chloride: 104 mmol/L (ref 98–111)
Creatinine, Ser: 0.68 mg/dL (ref 0.44–1.00)
GFR, Estimated: >60 mL/min (ref >60)
Glucose, Bld: 117 mg/dL — ABNORMAL HIGH (ref 70–99)
Potassium: 3.8 mmol/L (ref 3.5–5.1)
Sodium: 141 mmol/L (ref 135–145)

## 2024-01-11 LAB — CBC
HCT: 38 % (ref 36.0–46.0)
Hemoglobin: 11.7 g/dL — ABNORMAL LOW (ref 12.0–15.0)
MCH: 31.1 pg (ref 26.0–34.0)
MCHC: 30.8 g/dL (ref 30.0–36.0)
MCV: 101.1 fL — ABNORMAL HIGH (ref 80.0–100.0)
Platelets: 167 10*3/uL (ref 150–400)
RBC: 3.76 MIL/uL — ABNORMAL LOW (ref 3.87–5.11)
RDW: 14.6 % (ref 11.5–15.5)
WBC: 8.3 10*3/uL (ref 4.0–10.5)
nRBC: 0 % (ref 0.0–0.2)

## 2024-01-11 LAB — PHOSPHORUS: Phosphorus: 3.6 mg/dL (ref 2.5–4.6)

## 2024-01-11 MED ORDER — OLANZAPINE 10 MG IM SOLR
2.5000 mg | Freq: Four times a day (QID) | INTRAMUSCULAR | Status: DC | PRN
  Administered 2024-01-11: 2.5 mg via INTRAMUSCULAR
  Filled 2024-01-11: qty 10

## 2024-01-11 NOTE — Progress Notes (Signed)
 eLink Physician-Brief Progress Note Patient Name: Lindsay Mason DOB: 10-15-1950 MRN: [id number]   Date of Service  01/11/2024  HPI/Events of Note  RN seeking clarification on order for chest tube management.  She states chest tube is clamped but there is an order for chest tube to be placed to suction.   eICU Interventions  Chart reviewed.  Chest tube was on waterseal yesterday and has been clamped all of today.  Plan is to remove chest tube tomorrow if no reaccumulation of pleural air is noted on x-ray tomorrow.  Order for chest tube to suction discontinued.     Intervention Category Minor Interventions: Other:  Carilyn Goodpasture 01/11/2024, 8:40 PM

## 2024-01-11 NOTE — Plan of Care (Signed)
  Problem: Pain Managment: Goal: General experience of comfort will improve and/or be controlled Outcome: Progressing   Problem: Safety: Goal: Ability to remain free from injury will improve Outcome: Progressing   Problem: Skin Integrity: Goal: Risk for impaired skin integrity will decrease Outcome: Progressing

## 2024-01-11 NOTE — Progress Notes (Signed)
 PROGRESS NOTE    Lindsay Mason  ZOX:[id number] DOB: [birth year] DOA: 01/09/2024 PCP: Eustaquio Boyden, MD    Brief Narrative:  73 year old with history of tobacco use, COPD on room air, mild dementia, right lower lobectomy admitted to the hospital for left-sided pneumothorax.  Patient was seen by pulmonary team, chest tube was inserted.  Chest tube management per pulmonary team.   Assessment & Plan:  Active Problems:   * No active hospital problems. 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 - Chest tube placed by pulmonary, management per their service. Possible Clamping trial today.  - Clinically follow and repeat chest x-ray - Has outpatient radiation oncology appointment on April 1 which she can follow  Urinary retention - Closely monitor as this may lead to frequent agitation.  In and out catheterization as needed  History of COPD - Bronchodilators as needed  History of GERD - As needed Pepcid  Dementia with behavioral disturbances - On outpatient rivastigmine.  IV as needed Zyprexa  Hyperlipidemia -Statin   DVT prophylaxis: Lovenox    Code Status: Full Code Family Communication: Family uptodate, son at bedside Continue hospital stay for chest tube management   Subjective: Feels better today.  Breathing is improved. Son is present at bedside  Examination:  General exam: Appears calm and comfortable Respiratory system: Clear to auscultation. Respiratory effort normal. Cardiovascular system: S1 & S2 heard, RRR. No JVD, murmurs, rubs, gallops or clicks. No pedal edema. Gastrointestinal system: Abdomen is nondistended, soft and nontender. No organomegaly or masses felt. Normal bowel sounds heard. Central nervous system: Alert and oriented to name Extremities: Symmetric4  x 5  power. Skin: No rashes, lesions or ulcers Psychiatry: Judgement and insight appea poor Chest tube in place               Diet Orders (From admission, onward)     Start     Ordered   01/09/24 1020  Diet regular Room service appropriate? Yes; Fluid consistency: Thin  Diet effective now       Question Answer Comment  Room service appropriate? Yes   Fluid consistency: Thin      01/09/24 1019            Objective: Vitals:   01/10/24 1718 01/10/24 2005 01/11/24 0415 01/11/24 0902  BP: 120/68 97/86 101/66 (!) 124/101  Pulse: 70 94 85 (!) 112  Resp:  20 16 16   Temp: 98 F (36.7 C) 98.9 F (37.2 C) 98 F (36.7 C)   TempSrc: Axillary Oral Oral   SpO2: 100% 100%    Weight:      Height:        Intake/Output Summary (Last 24 hours) at 01/11/2024 1026 Last data filed at 01/10/2024 2224 Gross per 24 hour  Intake --  Output 1240 ml  Net -1240 ml   Filed Weights   01/09/24 1831  Weight: 68.7 kg    Scheduled Meds:  atorvastatin  20 mg Oral Daily   cyanocobalamin  1,000 mcg Oral Daily   enoxaparin (LOVENOX) injection  40 mg Subcutaneous QHS   feeding supplement  1 Container Oral TID with meals   lidocaine  2 patch Transdermal Q24H   multivitamin with minerals  1 tablet Oral Daily   rivastigmine  9.5 mg Transdermal Daily   Continuous Infusions:  Nutritional status  Body mass index is 27.7 kg/m.  Data Reviewed:   CBC: Recent Labs  Lab 01/09/24 0626 01/10/24 0411 01/11/24 0334  WBC 11.6* 9.1 8.3  NEUTROABS 10.6*  --   --   HGB 12.3 11.9* 11.7*  HCT 38.5 38.7 38.0  MCV 97.2 101.6* 101.1*  PLT 187 142* 167   Basic Metabolic Panel: Recent Labs  Lab 01/09/24 0626 01/10/24 1217 01/11/24 0334  NA 138 142 141  K 3.6 3.9 3.8  CL 101 107 104  CO2 26 29 31   GLUCOSE 144* 98 117*  BUN 20 13 13   CREATININE 0.79 0.60 0.68  CALCIUM 9.1 9.1 8.8*  MG  --   --  1.9  PHOS  --   --  3.6   GFR: Estimated Creatinine Clearance: 57.7 mL/min (by C-G  formula based on SCr of 0.68 mg/dL). Liver Function Tests: Recent Labs  Lab 01/09/24 0626  AST 24  ALT 23  ALKPHOS 61  BILITOT 0.7  PROT 6.7  ALBUMIN 4.2   Recent Labs  Lab 01/09/24 0626  LIPASE 50   No results for input(s): "AMMONIA" in the last 168 hours. Coagulation Profile: No results for input(s): "INR", "PROTIME" in the last 168 hours. Cardiac Enzymes: No results for input(s): "CKTOTAL", "CKMB", "CKMBINDEX", "TROPONINI" in the last 168 hours. BNP (last 3 results) No results for input(s): "PROBNP" in the last 8760 hours. HbA1C: No results for input(s): "HGBA1C" in the last 72 hours. CBG: No results for input(s): "GLUCAP" in the last 168 hours. Lipid Profile: No results for input(s): "CHOL", "HDL", "LDLCALC", "TRIG", "CHOLHDL", "LDLDIRECT" in the last 72 hours. Thyroid Function Tests: No results for input(s): "TSH", "T4TOTAL", "FREET4", "T3FREE", "THYROIDAB" in the last 72 hours. Anemia Panel: No results for input(s): "VITAMINB12", "FOLATE", "FERRITIN", "TIBC", "IRON", "RETICCTPCT" in the last 72 hours. Sepsis Labs: No results for input(s): "PROCALCITON", "LATICACIDVEN" in the last 168 hours.  No results found for this or any previous visit (from the past 240 hours).       Radiology Studies: DG CHEST PORT 1 VIEW Result Date: 01/11/2024 CLINICAL DATA:  [id number] Chest tube in place [id number] EXAM: PORTABLE CHEST 1 VIEW COMPARISON:  01/10/2024 FINDINGS: Left chest tube remains in place. No discernible pneumothorax. Mild left basilar atelectasis. Right lung remains clear. Heart size within normal limits. Left chest wall subcutaneous emphysema. IMPRESSION: Left chest tube remains in place. No discernible pneumothorax. Electronically Signed   By: Duanne Guess D.O.   On: 01/11/2024 10:12   DG CHEST PORT 1 VIEW Result Date: 01/10/2024 CLINICAL DATA:  [id number] Chest tube in place [id number] EXAM: PORTABLE CHEST 1 VIEW COMPARISON:  January 10, 2024 FINDINGS: The cardiomediastinal  silhouette is unchanged in contour.LEFT-sided chest tube. LEFT-sided subcutaneous air. No pleural effusion. No significant pneumothorax. LEFT basilar platelike opacities, likely atelectasis. IMPRESSION: LEFT-sided chest tube. No significant pneumothorax. Electronically Signed   By: Meda Klinefelter M.D.   On: 01/10/2024 17:20   DG CHEST PORT 1 VIEW Result Date: 01/10/2024 CLINICAL DATA:  Chest tube in place. EXAM: PORTABLE CHEST 1 VIEW COMPARISON:  Chest x-rays from yesterday. FINDINGS: The patient is rotated to the right. Left-sided chest tube again noted. Unchanged small left apical pneumothorax. The heart size and mediastinal contours are within normal limits. Normal pulmonary vascularity. Minimal bibasilar atelectasis. No pleural effusion. Increasing subcutaneous emphysema in the left lower neck and lateral chest wall. IMPRESSION: 1. Unchanged small left apical pneumothorax with chest tube in place. 2. Increasing subcutaneous emphysema in the left lower neck and  lateral chest wall. Electronically Signed   By: Obie Dredge M.D.   On: 01/10/2024 11:08   DG CHEST PORT 1 VIEW Result Date: 01/10/2024 CLINICAL DATA:  Follow-up chest tube placement EXAM: PORTABLE CHEST 1 VIEW COMPARISON:  01/09/2024 FINDINGS: Left-sided pigtail thoracostomy tube is noted with pigtail overlying the medial left upper lobe. Unchanged small residual left apical pneumothorax which measures roughly 1 cm. Small left pleural effusion and left base atelectasis. Left chest wall subcutaneous emphysema appears increased from previous exam. Heart size and mediastinal contours are stable. IMPRESSION: 1. Stable position of left-sided chest tube. Unchanged small residual left apical pneumothorax. 2. Small left pleural effusion and left base atelectasis. 3. Increased left chest wall subcutaneous emphysema. Electronically Signed   By: Signa Kell M.D.   On: 01/10/2024 08:41   DG CHEST PORT 1 VIEW Result Date: 01/09/2024 CLINICAL DATA:   Chest tube in place. EXAM: PORTABLE CHEST 1 VIEW COMPARISON:  Same day. FINDINGS: Stable cardiomegaly. Left-sided chest tube has been advanced toward left lung apex. Small left apical pneumothorax is noted. Stable subcutaneous emphysema is seen over left lateral chest wall. Stable bibasilar subsegmental atelectasis. IMPRESSION: Left-sided chest tube has been advanced toward left lung apex. Small left apical pneumothorax is noted. Stable left-sided subcutaneous emphysema. Electronically Signed   By: Lupita Raider M.D.   On: 01/09/2024 12:48           LOS: 1 day   Time spent= 35 mins    Miguel Rota, MD [facility]  If 7PM-7AM, please contact night-coverage  01/11/2024, 10:26 AM

## 2024-01-11 NOTE — TOC Initial Note (Signed)
 Transition of Care [facility]) - Initial/Assessment Note    Patient Details  Name: Lindsay Mason MRN: [id number] Date of Birth: September 18, 1951  Transition of Care [facility]) CM/SW Contact:    Diona Browner, LCSW Phone Number: 01/11/2024, 10:02 AM  Clinical Narrative:                 Pt from home w/ spouse. Pt currently on 4L of O2. Pt continues medical workup. TOC following for d/c needs.     Barriers to Discharge: Continued Medical Work up   Patient Goals and CMS Choice Patient states their goals for this hospitalization and ongoing recovery are:: return home CMS Medicare.gov Compare Post Acute Care list provided to::  (NA) Choice offered to / list presented to : NA [place] ownership interest in [facility].provided to::  (NA)    Expected Discharge Plan and Services In-house Referral: NA     Living arrangements for the past 2 months: Single Family Home                 DME Arranged: N/A DME Agency: NA       HH Arranged: NA HH Agency: NA        Prior Living Arrangements/Services Living arrangements for the past 2 months: Single Family Home Lives with:: Spouse Patient language and need for interpreter reviewed:: Yes Do you feel safe going back to the place where you live?: Yes      Need for Family Participation in Patient Care: Yes (Comment) Care giver support system in place?: Yes (comment)   Criminal Activity/Legal Involvement Pertinent to Current Situation/Hospitalization: No - Comment as needed  Activities of Daily Living   ADL Screening (condition at time of admission) Independently performs ADLs?: Yes (appropriate for developmental age) Is the patient deaf or have difficulty hearing?: No Does the patient have difficulty seeing, even when wearing glasses/contacts?: No Does the patient have difficulty concentrating, remembering, or making decisions?: Yes  Permission Sought/Granted                  Emotional Assessment Appearance:: Appears stated  age     Orientation: : Oriented to Self Alcohol / Substance Use: Not Applicable Psych Involvement: No (comment)  Admission diagnosis:  Pneumothorax on left [J93.9] Pneumothorax, unspecified type [J93.9] Pneumothorax [J93.9] Patient Active Problem List   Diagnosis Date Noted   Pneumothorax 01/10/2024   PAD (peripheral artery disease) (HCC) 12/06/2023   UTI due to extended-spectrum beta lactamase (ESBL) producing Escherichia coli 12/06/2023   Acid reflux 11/06/2023   Sundowning 11/06/2023   Medicare annual wellness visit, subsequent 05/01/2022   Skin lesion 02/14/2021   Pulmonary nodule, left 01/22/2021   Vitamin B12 deficiency 01/22/2021   History of essential hypertension 01/21/2021   Alzheimer dementia with behavioral disturbance (HCC) 01/21/2021   History of primary malignant neoplasm of right lung 01/20/2020   S/P lobectomy of lung 01/20/2020   Advanced care planning/counseling discussion 06/26/2017   Degenerative scoliosis 06/26/2017   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 [place] [address] Phone: [phone] Fax: [phone]     Social Drivers of Health (SDOH) Social History: SDOH Screenings  Food Insecurity: No Food Insecurity (01/09/2024)  Housing: Low Risk  (01/09/2024)  Transportation Needs: No Transportation Needs (01/09/2024)  Utilities: Not At Risk (01/09/2024)  Alcohol Screen: Low Risk  (05/01/2023)  Depression (PHQ2-9): Medium Risk (12/05/2023)  Financial Resource Strain: Low Risk  (05/01/2023)  Physical Activity: Sufficiently Active (05/01/2023)  Social Connections: Socially  Integrated (01/09/2024)  Stress: No Stress Concern Present (05/01/2023)  Tobacco Use: Medium Risk (01/09/2024)  Health Literacy: Adequate Health Literacy (05/01/2023)   SDOH Interventions:     Readmission Risk Interventions    01/11/2024   10:01 AM  Readmission Risk Prevention Plan  Post Dischage Appt Complete  Medication Screening Complete  Transportation Screening Complete

## 2024-01-11 NOTE — Progress Notes (Signed)
 NAME:  Lindsay Mason, MRN:  [id number], DOB:  25-Oct-1950, LOS: 1 ADMISSION DATE:  01/09/2024, CONSULTATION DATE:  01/09/2024 REFERRING MD:  Dr. Rennis Harding - EDP, CHIEF COMPLAINT: Abdominal pain  History of Present Illness:  Lindsay Mason is a 73 year old female with a past medical history significant for lung cancer s/p right lower lobectomy, COPD, ex-smoker, and early onset dementia who presented to the ED at [facility] 3/28 for complaints of abdominal pain.  Per patient pain started around 1 AM morning of admission and reports pain originates in the left flank and radiates to the left lower quadrant.  She reports 1 episode of vomiting this a.m. with normal bowel movements.  Also reports intermittent shortness of breath but no chest pain.  On ED arrival patient was seen hemodynamically stable with only mild tachypnea seen.  Lab work relatively unremarkable apart from mild leukocytosis with WBC 11.6.  Chest x-ray obtained and revealed large left pneumothorax with near complete atelectasis of left lung, EDP placed smallbore chest tube.  PCCM consulted for further management of chest tube.  Pertinent  Medical History  Lung cancer s/p right lower lobectomy, COPD, ex-smoker, and early onset dementia  Significant Hospital Events: Including procedures, antibiotic start and stop dates in addition to other pertinent events   3/28 presented for complaints of abdominal pain, found to have large left pneumothorax  Interim History / Subjective:  CXR looks like PTX has resolved on water seal, no air leak, clamping trial  Objective   Blood pressure (!) 124/101, pulse (!) 112, temperature 98 F (36.7 C), temperature source Oral, resp. rate 16, height 5\' 2"  (1.575 m), weight 68.7 kg, SpO2 100%.        Intake/Output Summary (Last 24 hours) at 01/11/2024 1123 Last data filed at 01/10/2024 2224 Gross per 24 hour  Intake --  Output 1240 ml  Net -1240 ml   Filed Weights   01/09/24 1831  Weight: 68.7 kg     Examination: General: Acute on chronic ill-appearing slightly deconditioned elderly female lying in bed in no acute distress HEENT: [place]/AT, MM pink/moist, PERRL,  Neuro: Alert, no focal deficits noted CV: s1s2 regular rate and rhythm, no murmur, rubs, or gallops,  PULM: Clear to auscultation bilaterally, no increased work of breathing, no added breath sounds, no air leak on water seal GI: soft, bowel sounds active in all 4 quadrants, non-tender, non-distended Extremities: warm/dry, no edema  Skin: no rashes or lesions   Resolved Hospital Problem list     Assessment & Plan:  Large left pneumothorax likely secondary to trauma Multiple rib fractures -CT abdomen identified minimally displaced 10th posterior rib fracture with nondisplaced 9th through 11th rib fractures as well -Family denies any known recent falls but states it would not be uncommon for patient to have fallen and not told family with underlying mild dementia 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 Cliffton Asters -On surveillance imaging it appears patient has a new LLL nodule that appears to have same PET appearance to RLL with adenocarcinoma.  Patient has been referred to radiation oncology for possible SBRT, appointment is scheduled with radiation oncology for April 1 P: Chest tube position stable on CXR Repeat chest x-ray with resolution of PTX, no air leak on water seal Clamping trial 3/30, repeat CXR in afternoon and in AM and if stable can plan removal 3/31 Daily CXR while tube in place  PCCM will follow for chest tube management  Best Practice (right click  and "Reselect all SmartList Selections" daily)  Per primary   Labs   CBC: Recent Labs  Lab 01/09/24 0626 01/10/24 0411 01/11/24 0334  WBC 11.6* 9.1 8.3  NEUTROABS 10.6*  --   --   HGB 12.3 11.9* 11.7*  HCT 38.5 38.7 38.0  MCV 97.2 101.6* 101.1*  PLT 187 142* 167    Basic Metabolic Panel: Recent Labs  Lab  01/09/24 0626 01/10/24 1217 01/11/24 0334  NA 138 142 141  K 3.6 3.9 3.8  CL 101 107 104  CO2 26 29 31   GLUCOSE 144* 98 117*  BUN 20 13 13   CREATININE 0.79 0.60 0.68  CALCIUM 9.1 9.1 8.8*  MG  --   --  1.9  PHOS  --   --  3.6   GFR: Estimated Creatinine Clearance: 57.7 mL/min (by C-G formula based on SCr of 0.68 mg/dL). Recent Labs  Lab 01/09/24 0626 01/10/24 0411 01/11/24 0334  WBC 11.6* 9.1 8.3    Liver Function Tests: Recent Labs  Lab 01/09/24 0626  AST 24  ALT 23  ALKPHOS 61  BILITOT 0.7  PROT 6.7  ALBUMIN 4.2   Recent Labs  Lab 01/09/24 0626  LIPASE 50   No results for input(s): "AMMONIA" in the last 168 hours.  ABG    Component Value Date/Time   PHART 7.241 (L) 01/20/2020 1149   PCO2ART 62.7 (H) 01/20/2020 1149   PO2ART 88.0 01/20/2020 1149   HCO3 27.0 01/20/2020 1149   TCO2 29 01/20/2020 1149   ACIDBASEDEF 2.0 01/20/2020 1149   O2SAT 95.0 01/20/2020 1149     Coagulation Profile: No results for input(s): "INR", "PROTIME" in the last 168 hours.  Cardiac Enzymes: No results for input(s): "CKTOTAL", "CKMB", "CKMBINDEX", "TROPONINI" in the last 168 hours.  HbA1C: No results found for: "HGBA1C"  CBG: No results for input(s): "GLUCAP" in the last 168 hours.  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   Surgical History:   Past Surgical History:  Procedure Laterality Date   CATARACT EXTRACTION W/PHACO Right 02/21/2021   Procedure: CATARACT EXTRACTION PHACO AND INTRAOCULAR LENS PLACEMENT (IOC) RIGHT;  Surgeon: Lockie Mola, MD;  Location: [facility];  Service: Ophthalmology;  Laterality: Right;   cde 4.75 00:41.1 minutes 11.5%   CATARACT EXTRACTION W/PHACO Left 03/14/2021   Procedure: CATARACT EXTRACTION PHACO AND INTRAOCULAR LENS PLACEMENT (IOC) LEFT 4.07 00:44.6;  Surgeon: Lockie Mola, MD;  Location: [facility];  Service: Ophthalmology;  Laterality: Left;   COLONOSCOPY  06/2015   microscopic colitis Christella Hartigan)   DEXA  10/2008   Osteoporosis   DEXA  02/2014   T score osteopenia hip, osteoporosis spine, scoliosis   ELECTROMAGNETIC NAVIGATION BROCHOSCOPY  01/20/2020   Procedure: ELECTROMAGNETIC NAVIGATION BRONCHOSCOPY;  Surgeon: Josephine Igo, DO;  Location: [facility];  Service: Pulmonary;;   ETT  10/2011   WNL, no evidence of ischemia, excellent exercise tolerance   EXPLORATORY LAPAROTOMY     INTERCOSTAL NERVE BLOCK Right 01/20/2020   Procedure: Intercostal Nerve Block;  Surgeon: Corliss Skains, MD;  Location: [facility] OR;  Service: Thoracic;  Laterality: Right;   MRI head  07/2011   multiple foci deep and subcortical white matter, chronic ischemic vs demyelinating, no active disease  NODE DISSECTION  01/20/2020   Procedure: Node Dissection;  Surgeon: Corliss Skains, MD;  Location: [facility] OR;  Service: Thoracic;;   SUBMUCOSAL TATTOO INJECTION  01/20/2020   Procedure: SUBSTITIAL TATTOO INJECTION;  Surgeon: Josephine Igo, DO;  Location: [facility];  Service: Pulmonary;;   TONSILLECTOMY AND ADENOIDECTOMY     Vein removal     removed from legs   VIDEO ASSISTED THORACOSCOPY (VATS)/WEDGE RESECTION Right 01/20/2020   XI ROBOTIC ASSISTED THORASCOPY-RIGHT LOWER LOBE WEDGE RESECTION, RIGHT LOWER LOBECTOMYRightGeneral   VIDEO BRONCHOSCOPY  01/20/2020   Procedure: VIDEO BRONCHOSCOPY WITH FLUORO;  Surgeon: Josephine Igo, DO;  Location: [facility];  Service: Pulmonary;;     Social History:   reports that she quit smoking about 8 years ago. Her smoking use included cigarettes. She started smoking about 45 years ago. She has a 36.7 pack-year smoking history. She has never  used smokeless tobacco. She reports current alcohol use. She reports that she does not use drugs.   Family History:  Her family history includes Coronary artery disease (age of onset: 69) in her maternal grandfather; Coronary artery disease (age of onset: 54) in her paternal grandfather and paternal grandmother; Diabetes in her maternal grandfather; Lung cancer in her maternal aunt; Osteoporosis in an other family member; Stroke in her maternal grandmother.   Allergies Allergies  Allergen Reactions   Penicillins Swelling    REACTION: swelling tongue - States has taken Cephalosporins without difficulty in past     Home Medications  Prior to Admission medications   Medication Sig Start Date End Date Taking? Authorizing Provider  albuterol (VENTOLIN HFA) 108 (90 Base) MCG/ACT inhaler Inhale 2 puffs into the lungs every 6 (six) hours as needed for wheezing or shortness of breath. 08/15/22   Icard, Rachel Bo, DO  atorvastatin (LIPITOR) 20 MG tablet Take 1 tablet (20 mg total) by mouth daily. 12/10/23   Eustaquio Boyden, MD  cyanocobalamin (VITAMIN B12) 1000 MCG tablet Take 1 tablet (1,000 mcg total) by mouth daily. 11/01/22   Eustaquio Boyden, MD  famotidine (PEPCID) 20 MG tablet Take 1 tablet (20 mg total) by mouth daily as needed for heartburn or indigestion. 12/05/23   Eustaquio Boyden, MD  Multiple Vitamin (MULTIVITAMIN) tablet Take 1 tablet by mouth daily.    [provider]  rivastigmine (EXELON) 4.6 mg/24hr Place 1 patch (4.6 mg total) onto the skin daily. 12/05/23   Eustaquio Boyden, MD  rivastigmine (EXELON) 9.5 mg/24hr Place 1 patch (9.5 mg total) onto the skin daily. 12/26/23   Eustaquio Boyden, MD     Critical care time: NA  Karren Burly, MD [facility] Personal contact information can be found on Amion  If no contact or response made please call [phone] 01/11/2024, 11:23 AM

## 2024-01-12 ENCOUNTER — Inpatient Hospital Stay (HOSPITAL_COMMUNITY)

## 2024-01-12 DIAGNOSIS — S270XXA Traumatic pneumothorax, initial encounter: Secondary | ICD-10-CM | POA: Diagnosis not present

## 2024-01-12 DIAGNOSIS — J9311 Primary spontaneous pneumothorax: Secondary | ICD-10-CM | POA: Diagnosis not present

## 2024-01-12 LAB — BASIC METABOLIC PANEL WITH GFR
Anion gap: 9 (ref 5–15)
BUN: 15 mg/dL (ref 8–23)
CO2: 28 mmol/L (ref 22–32)
Calcium: 8.9 mg/dL (ref 8.9–10.3)
Chloride: 103 mmol/L (ref 98–111)
Creatinine, Ser: 0.71 mg/dL (ref 0.44–1.00)
GFR, Estimated: >60 mL/min (ref >60)
Glucose, Bld: 96 mg/dL (ref 70–99)
Potassium: 3.5 mmol/L (ref 3.5–5.1)
Sodium: 140 mmol/L (ref 135–145)

## 2024-01-12 LAB — CBC
HCT: 39.2 % (ref 36.0–46.0)
Hemoglobin: 12.4 g/dL (ref 12.0–15.0)
MCH: 31.2 pg (ref 26.0–34.0)
MCHC: 31.6 g/dL (ref 30.0–36.0)
MCV: 98.5 fL (ref 80.0–100.0)
Platelets: 183 10*3/uL (ref 150–400)
RBC: 3.98 MIL/uL (ref 3.87–5.11)
RDW: 14.3 % (ref 11.5–15.5)
WBC: 12.2 10*3/uL — ABNORMAL HIGH (ref 4.0–10.5)
nRBC: 0 % (ref 0.0–0.2)

## 2024-01-12 LAB — MAGNESIUM: Magnesium: 2.1 mg/dL (ref 1.7–2.4)

## 2024-01-12 MED ORDER — CHLORHEXIDINE GLUCONATE CLOTH 2 % EX PADS
6.0000 | MEDICATED_PAD | Freq: Every day | CUTANEOUS | Status: DC
  Administered 2024-01-12 – 2024-01-17 (×6): 6 via TOPICAL

## 2024-01-12 NOTE — Evaluation (Signed)
 Physical Therapy Evaluation Patient Details Name: Lindsay Mason MRN: [id number] DOB: 1951/03/16 Today's Date: 01/12/2024  History of Present Illness  73 yo female admitted with pneumothorax, atelectasis, multple rib fxs. Hx of dementia, lung ca s/p lobectomy, COPD.  Clinical Impression  On eval, pt found sitting up in recliner with husband and sitter present. Min-Max A +2 to get pt back to bed-pt is physically capable of mobilizing, but impaired cognition (hx of dementia) impacted session quite a bit. Pt was easily distracted (externally and internally) this session. She tended to keep her eyes closed for most of session. 1 step command following was inconsistent. Will plan to follow pt and progress activity as safely able. At this time, feel patient may benefit from continued inpatient follow up therapy, <3 hours/day.       If plan is discharge home, recommend the following: A lot of help with walking and/or transfers;A lot of help with bathing/dressing/bathroom;Assistance with cooking/housework;Assist for transportation;Help with stairs or ramp for entrance   Can travel by private vehicle        Equipment Recommendations  (continuing to assess/TBD at next venue)  Recommendations for Other Services  OT consult    Functional Status Assessment Patient has had a recent decline in their functional status and demonstrates the ability to make significant improvements in function in a reasonable and predictable amount of time.     Precautions / Restrictions Precautions Precautions: Fall Precaution/Restrictions Comments: monitor O2 Restrictions Weight Bearing Restrictions Per Provider Order: No      Mobility  Bed Mobility Overal bed mobility: Needs Assistance Bed Mobility: Sit to Supine       Sit to supine: Max assist, +2 for physical assistance, +2 for safety/equipment, HOB elevated   General bed mobility comments: Increased assist due to pt not following commands or initiating  task to return to bed 2* dementia    Transfers Overall transfer level: Needs assistance   Transfers: Sit to/from Stand, Bed to chair/wheelchair/BSC Sit to Stand: Min Assist; +2 safety/equipment, +2 physical assistance Stand pivot transfers: Min assist, +2 physical assistance, +2 safety/equipment         General transfer comment: Assist to power up, steady, control descent. Increased time required to get pt to participate-pt a bit resistive and distracted.    Ambulation/Gait               General Gait Details: NT on today.  Stairs            Wheelchair Mobility     Tilt Bed    Modified Rankin (Stroke Patients Only)       Balance Overall balance assessment: Needs assistance         Standing balance support: During functional activity Standing balance-Leahy Scale: Poor                               Pertinent Vitals/Pain Pain Assessment Pain Assessment: Faces Faces Pain Scale: Hurts little more Pain Location: ribcage Pain Intervention(s): Limited activity within patient's tolerance, Monitored during session, Repositioned    Home Living Family/patient expects to be discharged to:: Private residence Living Arrangements: Spouse/significant other Available Help at Discharge: Family Type of Home: House Home Access: Stairs to enter Entrance Stairs-Rails: Can reach both Entrance Stairs-Number of Steps: 3 Alternate Level Stairs-Number of Steps: 1 flight Home Layout: Two level;Full bath on main level Home Equipment: None Additional Comments: husband needs B knee's relaced and has been having  progressive health problems himself    Prior Function Prior Level of Function : Other (comment) (as per husband due to patient being poor historian and lethargic due to dementia patient required 24 hr S from husband but was physcally able to perform her ADL's and amb without AD)             Mobility Comments: Supervision (Simultaneous filing. User  may not have seen previous data.) ADLs Comments: Supervision due to dementia history     Extremity/Trunk Assessment   Upper Extremity Assessment Upper Extremity Assessment: Defer to OT evaluation    Lower Extremity Assessment Lower Extremity Assessment: Difficult to assess due to impaired cognition (able to weightbear)    Cervical / Trunk Assessment Cervical / Trunk Assessment: Normal  Communication   Communication Communication: No apparent difficulties    Cognition   Behavior During Therapy: Restless   PT - Cognitive impairments: History of cognitive impairments                       PT - Cognition Comments: pt tends to keep eyes closed Following commands: Impaired Following commands impaired: Follows one step commands inconsistently     Cueing Cueing Techniques: Verbal cues, Gestural cues, Tactile cues, Visual cues     General Comments      Exercises     Assessment/Plan    PT Assessment Patient needs continued PT services  PT Problem List Decreased strength;Decreased activity tolerance;Decreased balance;Decreased mobility;Decreased knowledge of use of DME;Decreased cognition;Decreased safety awareness;Decreased knowledge of precautions;Pain       PT Treatment Interventions DME instruction;Gait training;Functional mobility training;Therapeutic activities;Therapeutic exercise;Patient/family education;Balance training    PT Goals (Current goals can be found in the Care Plan section)  Acute Rehab PT Goals PT Goal Formulation: Patient unable to participate in goal setting Time For Goal Achievement: 01/26/24 Potential to Achieve Goals: Good    Frequency Min 2X/week     Co-evaluation               AM-PAC PT "6 Clicks" Mobility  Outcome Measure Help needed turning from your back to your side while in a flat bed without using bedrails?: A Lot Help needed moving from lying on your back to sitting on the side of a flat bed without using  bedrails?: A Lot Help needed moving to and from a bed to a chair (including a wheelchair)?: A Lot Help needed standing up from a chair using your arms (e.g., wheelchair or bedside chair)?: A Lot Help needed to walk in hospital room?: A Lot Help needed climbing 3-5 steps with a railing? : A Lot 6 Click Score: 12    End of Session   Activity Tolerance: Patient tolerated treatment well (limited by cognition) Patient left: in bed;with call bell/phone within reach;with family/visitor present;with nursing/sitter in room        Time: 1439-1457 PT Time Calculation (min) (ACUTE ONLY): 18 min   Charges:   PT Evaluation $PT Eval Low Complexity: 1 Low   PT General Charges $$ ACUTE PT VISIT: 1 Visit            Faye Ramsay, PT Acute Rehabilitation  Office: [phone]

## 2024-01-12 NOTE — Progress Notes (Signed)
 I have reviewed and concur with this student's documentation.   Demeka [employer] trader, RN 01/12/2024 5:01 PM

## 2024-01-12 NOTE — Progress Notes (Signed)
 NAME:  Lindsay Mason, MRN:  [id number], DOB:  [birth year], LOS: 2 ADMISSION DATE:  01/09/2024, CONSULTATION DATE:  01/09/2024 REFERRING MD:  Dr. Rennis Harding - EDP, CHIEF COMPLAINT: Abdominal pain  History of Present Illness:  Lindsay Mason is a 73 year old female with a past medical history significant for lung cancer s/p right lower lobectomy, COPD, ex-smoker, and early onset dementia who presented to the ED at [facility] 3/28 for complaints of abdominal pain.  Per patient pain started around 1 AM morning of admission and reports pain originates in the left flank and radiates to the left lower quadrant.  She reports 1 episode of vomiting this a.m. with normal bowel movements.  Also reports intermittent shortness of breath but no chest pain.  On ED arrival patient was seen hemodynamically stable with only mild tachypnea seen.  Lab work relatively unremarkable apart from mild leukocytosis with WBC 11.6.  Chest x-ray obtained and revealed large left pneumothorax with near complete atelectasis of left lung, EDP placed smallbore chest tube.  PCCM consulted for further management of chest tube.  Pertinent  Medical History  Lung cancer s/p right lower lobectomy, COPD, ex-smoker, and early onset dementia  Significant Hospital Events: Including procedures, antibiotic start and stop dates in addition to other pertinent events   3/28 presented for complaints of abdominal pain, found to have large left pneumothorax 3/31 chest x-ray no pneumothorax  Interim History / Subjective:  CXR looks like PTX has resolved on water seal, no air leak, clamping trial Comfortable, denies any significant complaints  Objective   Blood pressure (!) 106/58, pulse 77, temperature 98.8 F (37.1 C), temperature source Oral, resp. rate 16, height 5\' 2"  (1.575 m), weight 68.7 kg, SpO2 100%.        Intake/Output Summary (Last 24 hours) at 01/12/2024 0934 Last data filed at 01/12/2024 1478 Gross per 24 hour  Intake 477 ml   Output 1600 ml  Net -1123 ml   Filed Weights   01/09/24 1831  Weight: 68.7 kg    Examination: General: Elderly frail, chronically ill-appearing HEENT: Moist oral mucosa Neuro: Awake, arousable, confused CV: S1-S2 appreciated  PULM: Clear breath sounds bilaterally  GI: Soft, bowel sounds appreciated Extremities: warm/dry, no edema  Skin: no rashes or lesions   Resolved Hospital Problem list     Assessment & Plan:   Large left pneumothorax likely secondary to trauma Multiple rib fractures -Has been stable -Chest tube has been clamped and chest x-ray today shows no pneumothorax -Decision made to remove chest tube  Tolerated chest tube removal  History of right lower lobe adenocarcinoma status post right lower lobe lobectomy in 2022 by Dr. Cliffton Asters -Undergoing surveillance -Referred to radiation oncology for possible SBRT  No indication for chest x-ray or any other short-term follow-up If complaining of chest discomfort or shortness of breath, chest x-ray can be repeated  Best Practice (right click and "Reselect all SmartList Selections" daily)  Per primary   Labs   CBC: Recent Labs  Lab 01/09/24 0626 01/10/24 0411 01/11/24 0334 01/12/24 0327  WBC 11.6* 9.1 8.3 12.2*  NEUTROABS 10.6*  --   --   --   HGB 12.3 11.9* 11.7* 12.4  HCT 38.5 38.7 38.0 39.2  MCV 97.2 101.6* 101.1* 98.5  PLT 187 142* 167 183    Basic Metabolic Panel: Recent Labs  Lab 01/09/24 0626 01/10/24 1217 01/11/24 0334 01/12/24 0327  NA 138 142 141 140  K 3.6 3.9 3.8 3.5  CL 101 107 104  103  CO2 26 29 31 28   GLUCOSE 144* 98 117* 96  BUN 20 13 13 15   CREATININE 0.79 0.60 0.68 0.71  CALCIUM 9.1 9.1 8.8* 8.9  MG  --   --  1.9 2.1  PHOS  --   --  3.6  --    GFR: Estimated Creatinine Clearance: 57.7 mL/min (by C-G formula based on SCr of 0.71 mg/dL). Recent Labs  Lab 01/09/24 0626 01/10/24 0411 01/11/24 0334 01/12/24 0327  WBC 11.6* 9.1 8.3 12.2*    Liver Function  Tests: Recent Labs  Lab 01/09/24 0626  AST 24  ALT 23  ALKPHOS 61  BILITOT 0.7  PROT 6.7  ALBUMIN 4.2   Recent Labs  Lab 01/09/24 0626  LIPASE 50   No results for input(s): "AMMONIA" in the last 168 hours.  ABG    Component Value Date/Time   PHART 7.241 (L) 01/20/2020 1149   PCO2ART 62.7 (H) 01/20/2020 1149   PO2ART 88.0 01/20/2020 1149   HCO3 27.0 01/20/2020 1149   TCO2 29 01/20/2020 1149   ACIDBASEDEF 2.0 01/20/2020 1149   O2SAT 95.0 01/20/2020 1149     Coagulation Profile: No results for input(s): "INR", "PROTIME" in the last 168 hours.  Cardiac Enzymes: No results for input(s): "CKTOTAL", "CKMB", "CKMBINDEX", "TROPONINI" in the last 168 hours.  HbA1C: No results found for: "HGBA1C"  CBG: No results for input(s): "GLUCAP" in the last 168 hours.  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   Surgical History:   Past Surgical History:  Procedure Laterality Date   CATARACT EXTRACTION W/PHACO Right 02/21/2021   Procedure: CATARACT EXTRACTION PHACO AND INTRAOCULAR LENS PLACEMENT (IOC) RIGHT;  Surgeon: Lockie Mola, MD;  Location: [facility];  Service: Ophthalmology;  Laterality: Right;  cde 4.75 00:41.1 minutes 11.5%   CATARACT EXTRACTION W/PHACO Left 03/14/2021   Procedure: CATARACT EXTRACTION PHACO AND INTRAOCULAR LENS PLACEMENT (IOC) LEFT 4.07 00:44.6;  Surgeon: Lockie Mola, MD;  Location: [facility];  Service: Ophthalmology;  Laterality: Left;   COLONOSCOPY  06/2015   microscopic colitis Christella Hartigan)   DEXA  10/2008   Osteoporosis   DEXA  02/2014   T score osteopenia hip, osteoporosis spine,  scoliosis   ELECTROMAGNETIC NAVIGATION BROCHOSCOPY  01/20/2020   Procedure: ELECTROMAGNETIC NAVIGATION BRONCHOSCOPY;  Surgeon: Josephine Igo, DO;  Location: [facility];  Service: Pulmonary;;   ETT  10/2011   WNL, no evidence of ischemia, excellent exercise tolerance   EXPLORATORY LAPAROTOMY     INTERCOSTAL NERVE BLOCK Right 01/20/2020   Procedure: Intercostal Nerve Block;  Surgeon: Corliss Skains, MD;  Location: [facility] OR;  Service: Thoracic;  Laterality: Right;   MRI head  07/2011   multiple foci deep and subcortical white matter, chronic ischemic vs demyelinating, no active disease   NODE DISSECTION  01/20/2020   Procedure: Node Dissection;  Surgeon: Corliss Skains, MD;  Location: [facility] OR;  Service: Thoracic;;   SUBMUCOSAL TATTOO INJECTION  01/20/2020   Procedure: SUBSTITIAL TATTOO INJECTION;  Surgeon: Josephine Igo, DO;  Location: [facility];  Service: Pulmonary;;   TONSILLECTOMY AND ADENOIDECTOMY     Vein removal     removed from legs   VIDEO ASSISTED THORACOSCOPY (VATS)/WEDGE RESECTION Right 01/20/2020  XI ROBOTIC ASSISTED THORASCOPY-RIGHT LOWER LOBE WEDGE RESECTION, RIGHT LOWER LOBECTOMYRightGeneral   VIDEO BRONCHOSCOPY  01/20/2020   Procedure: VIDEO BRONCHOSCOPY WITH FLUORO;  Surgeon: Josephine Igo, DO;  Location: [facility];  Service: Pulmonary;;     Social History:   reports that she quit smoking about 8 years ago. Her smoking use included cigarettes. She started smoking about 45 years ago. She has a 36.7 pack-year smoking history. She has never used smokeless tobacco. She reports current alcohol use. She reports that she does not use drugs.   Family History:  Her family history includes Coronary artery disease (age of onset: 3) in her maternal grandfather; Coronary artery disease (age of onset: 27) in her paternal grandfather and paternal grandmother; Diabetes in her maternal grandfather; Lung cancer in her maternal aunt; Osteoporosis in an other family member; Stroke  in her maternal grandmother.   Allergies Allergies  Allergen Reactions   Penicillins Swelling    REACTION: swelling tongue - States has taken Cephalosporins without difficulty in past     Virl Diamond, MD [place] [facility] Pager: See Loretha Stapler

## 2024-01-12 NOTE — Evaluation (Signed)
 Occupational Therapy Evaluation Patient Details Name: Lindsay Mason MRN: [id number] DOB: 11-27-1950 Today's Date: 01/12/2024   History of Present Illness   73 yo female admitted with pneumothorax, atelectasis, multple rib fxs. Hx of dementia, lung ca s/p lobectomy, COPD.     Clinical Impressions PTA, patient was living at home with 24/7 S from husband. As per husband due to patient being poor historian and lethargy this visit, due to dementia, patient required 24 hr S from husband but was physcally able to perform her ADL's and amb without AD. Currently, patient requires significant assist as outlined below for BADL's and simple mobility due to pain, cognitive, vision, generalized strength, activity tolerance and balance deficits. Patient will continue to require Acute level skilled OT services to maximize functional level. Patient will benefit from continued inpatient follow up therapy, <3 hours/day.       If plan is discharge home, recommend the following:   Two people to help with walking and/or transfers;Two people to help with bathing/dressing/bathroom;Assistance with cooking/housework;Assistance with feeding;Direct supervision/assist for medications management;Direct supervision/assist for financial management;Assist for transportation;Help with stairs or ramp for entrance;Supervision due to cognitive status     Functional Status Assessment   Patient has had a recent decline in their functional status and demonstrates the ability to make significant improvements in function in a reasonable and predictable amount of time.     Equipment Recommendations   Other (comment) (TBD if going home vs SNF for rehab)      Precautions/Restrictions   Precautions Precautions: Fall Recall of Precautions/Restrictions: Impaired Precaution/Restrictions Comments: on 3 ltrs O2 via [place], monitor O2 sats Restrictions Weight Bearing Restrictions Per Provider Order: No     Mobility Bed  Mobility Overal bed mobility: Needs Assistance                      Balance Overall balance assessment: Needs assistance Sitting-balance support: Bilateral upper extremity supported Sitting balance-Leahy Scale: Fair                                     ADL either performed or assessed with clinical judgement   ADL Overall ADL's : Needs assistance/impaired Eating/Feeding: Cueing for safety;Cueing for sequencing;Bed level;Moderate assistance (simple spoon and cup to mouth with cues)   Grooming: Wash/dry hands;Wash/dry face;Oral care;Brushing hair;Moderate assistance;Cueing for safety;Cueing for sequencing;Bed level   Upper Body Bathing: Maximal assistance;Cueing for sequencing;Cueing for safety;Bed level   Lower Body Bathing: Total assistance;Bed level   Upper Body Dressing : Maximal assistance;Bed level;Cueing for sequencing;Cueing for safety   Lower Body Dressing: Total assistance;Bed level               Functional mobility during ADLs:  (OT deferring to PT eval as patient was just returned back to bed by PT and pillow propped for comfort with pt lethargic and unable to tolerance further mobility this afternoon) General ADL Comments: max cues for simple 1 step BADL's due to lethargy, fatigue, cognitive status  and pain     Vision Baseline Vision/History: 1 Wears glasses Ability to See in Adequate Light: 1 Impaired Patient Visual Report: Blurring of vision (husband reports patients vision has been progressively worsening due to cognitive decline) Vision Assessment?: Vision impaired- to be further tested in functional context (difficult to assess with patients lethargy holding eyes closed frequently)     Perception Perception: Impaired       Praxis Praxis:  Impaired Praxis Impairment Details: Motor planning, Organization, Initiation     Pertinent Vitals/Pain Pain Assessment Pain Assessment: Faces Faces Pain Scale: Hurts a little bit Breathing:  normal Negative Vocalization: none Facial Expression: smiling or inexpressive Body Language: relaxed Consolability: no need to console PAINAD Score: 0 Pain Location: L sided chest wall, ribs Pain Descriptors / Indicators: Grimacing, Guarding Pain Intervention(s): Limited activity within patient's tolerance, Monitored during session, Repositioned     Extremity/Trunk Assessment Upper Extremity Assessment Upper Extremity Assessment: Defer to OT evaluation   Lower Extremity Assessment Lower Extremity Assessment: Difficult to assess due to impaired cognition (able to weightbear)   Cervical / Trunk Assessment Cervical / Trunk Assessment: Normal   Communication Communication Communication: No apparent difficulties   Cognition Arousal: Obtunded Behavior During Therapy: Restless Cognition: History of cognitive impairments, Cognition impaired   Orientation impairments: Place, Time, Situation Awareness: Intellectual awareness impaired, Online awareness impaired Memory impairment (select all impairments): Short-term memory Attention impairment (select first level of impairment): Focused attention Executive functioning impairment (select all impairments): Initiation, Organization, Sequencing, Reasoning, Problem solving OT - Cognition Comments: requires max cues and repetition due to lethargy this session                 Following commands: Impaired Following commands impaired: Follows one step commands inconsistently     Cueing  General Comments   Cueing Techniques: Verbal cues;Gestural cues  pain and lethargy limited mobility assessment           Home Living Family/patient expects to be discharged to:: Private residence Living Arrangements: Spouse/significant other Available Help at Discharge: Family Type of Home: House Home Access: Stairs to enter Secretary/administrator of Steps: 3 Entrance Stairs-Rails: Can reach both Home Layout: Two level;Full bath on main  level Alternate Level Stairs-Number of Steps: 1 flight Alternate Level Stairs-Rails: Can reach both;Right;Left Bathroom Shower/Tub: Tub/shower unit;Walk-in shower;Door   [employer] Toilet: Standard Bathroom Accessibility: Yes How Accessible: Accessible via wheelchair;Accessible via walker Home Equipment: None   Additional Comments: husband needs B knee's relaced and has been having progressive health problems himself      Prior Functioning/Environment Prior Level of Function : Other (comment) (as per husband due to patient being poor historian and lethargic due to dementia patient required 24 hr S from husband but was physcally able to perform her ADL's and amb without AD)             Mobility Comments: Supervision (Simultaneous filing. User may not have seen previous data.) ADLs Comments: Supervision due to dementia history    OT Problem List: Decreased strength;Decreased activity tolerance;Impaired balance (sitting and/or standing);Impaired vision/perception;Decreased coordination;Decreased cognition;Decreased safety awareness;Decreased knowledge of use of DME or AE;Decreased knowledge of precautions;Cardiopulmonary status limiting activity;Impaired UE functional use;Pain   OT Treatment/Interventions: Self-care/ADL training;Therapeutic exercise;Neuromuscular education;Energy conservation;DME and/or AE instruction;Therapeutic activities;Cognitive remediation/compensation;Visual/perceptual remediation/compensation;Patient/family education;Balance training      OT Goals(Current goals can be found in the care plan section)   Acute Rehab OT Goals Patient Stated Goal: to get stronger to get home OT Goal Formulation: With family Time For Goal Achievement: 01/26/24 Potential to Achieve Goals: Fair   OT Frequency:  Min 2X/week       AM-PAC OT "6 Clicks" Daily Activity     Outcome Measure Help from another person eating meals?: A Lot Help from another person taking care of  personal grooming?: A Lot Help from another person toileting, which includes using toliet, bedpan, or urinal?: Total Help from another person bathing (including washing, rinsing, drying)?: Total  Help from another person to put on and taking off regular upper body clothing?: A Lot Help from another person to put on and taking off regular lower body clothing?: Total 6 Click Score: 9   End of Session Equipment Utilized During Treatment: Oxygen Nurse Communication: Other (comment) (hand off to NT for supervision as hand mitts off)  Activity Tolerance: Patient limited by fatigue;Patient limited by lethargy;Patient limited by pain Patient left: in bed;with call bell/phone within reach;with bed alarm set;with nursing/sitter in room;with family/visitor present;with SCD's reapplied  OT Visit Diagnosis: Unsteadiness on feet (R26.81);Muscle weakness (generalized) (M62.81);Cognitive communication deficit (R41.841);Low vision, both eyes (H54.2);Pain Symptoms and signs involving cognitive functions: Other cerebrovascular disease Pain - Right/Left: Left Pain - part of body:  (flank/ribs)                Time: 1500-1530 OT Time Calculation (min): 30 min Charges:  OT General Charges $OT Visit: 1 Visit OT Evaluation $OT Eval Moderate Complexity: 1 Mod OT Treatments $Self Care/Home Management : 8-22 mins  Jamisha Hoeschen OT/L Acute Rehabilitation Department  [phone]  01/12/2024, 4:57 PM

## 2024-01-12 NOTE — Significant Event (Signed)
 Rapid Response Event Note   Reason for Call :  Chest tube disconnected stop cock and [place]    Initial Focused Assessment:  Patient resting quietly in bed, Sat's stable   Interventions:  Reconnected Stop cock to small bore chest tube and [place]     Plan of Care:  Chest X ray   MD Notified: Dr. Lattie Haw  Call Time: 0815  Arrival Time: 0800  End Time: 0820   Sharyn Lull Garyn Arlotta, RN

## 2024-01-12 NOTE — Procedures (Signed)
Chest tube removed uneventfully °

## 2024-01-12 NOTE — Progress Notes (Cosign Needed)
 Dr. Wynona Neat removed chest tube; Once chest tube was removed, pain increased along with flank pain. Pt stated that she was unable to breathe; educated on focused breathing and increased O2. Pt was also repositioned in bed to an upright/chair sitting position. Dr. Nelson Chimes d/c O2 and then pt started to experience anxiety and O2 destated again. Both Dr. Wynona Neat and Dr. Nelson Chimes were notified and O2 was then placed at 3L [place]. Pt is currently comfortable in bed; bed locked and call light is near and in place. A sitter is also in the room.

## 2024-01-12 NOTE — Progress Notes (Signed)
 PROGRESS NOTE    Lindsay Mason  ZOX:[id number] DOB: July 05, 1951 DOA: 01/09/2024 PCP: Eustaquio Boyden, MD    Brief Narrative:  73 year old with history of tobacco use, COPD on room air, mild dementia, right lower lobectomy admitted to the hospital for left-sided pneumothorax.  Patient was seen by pulmonary team, chest tube was inserted.  Chest tube management per pulmonary team.   Assessment & Plan:  Active Problems:   * No active hospital problems. 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 - Chest tube placed by pulmonary, management per their service. Chest tube removed 3/31 - Clinically follow and repeat chest x-ray - Has outpatient radiation oncology appointment on April 1 which she can follow  Urinary retention - Closely monitor as this may lead to frequent agitation.  In and out catheterization as needed  History of COPD - Bronchodilators as needed  History of GERD - As needed Pepcid  Dementia with behavioral disturbances - On outpatient rivastigmine.  IV as needed Zyprexa  Hyperlipidemia -Statin  PT/OT   DVT prophylaxis: Lovenox    Code Status: Full Code Family Communication: Family uptodate Continue hospital stay for chest tube management   Subjective: Feeling okay no complaints at this time.  Examination:  General exam: Appears calm and comfortable Respiratory system: Clear to auscultation. Respiratory effort normal. Poor insp effort Cardiovascular system: S1 & S2 heard, RRR. No JVD, murmurs, rubs, gallops or clicks. No pedal edema. Gastrointestinal system: Abdomen is nondistended, soft and nontender. No organomegaly or masses felt. Normal bowel sounds heard. Central nervous system: Alert and oriented to name Extremities: Symmetric4  x 5 power. Skin: No rashes, lesions or  ulcers Psychiatry: Judgement and insight appea poor                Diet Orders (From admission, onward)     Start     Ordered   01/09/24 1020  Diet regular Room service appropriate? Yes; Fluid consistency: Thin  Diet effective now       Question Answer Comment  Room service appropriate? Yes   Fluid consistency: Thin      01/09/24 1019            Objective: Vitals:   01/11/24 2231 01/12/24 0619 01/12/24 1100 01/12/24 1105  BP:  (!) 106/58    Pulse:  77    Resp:  16    Temp:  98.8 F (37.1 C)    TempSrc:  Oral    SpO2: 97% 100% (S) (!) 72% 98%  Weight:      Height:        Intake/Output Summary (Last 24 hours) at 01/12/2024 1205 Last data filed at 01/12/2024 8119 Gross per 24 hour  Intake 477 ml  Output 1600 ml  Net -1123 ml   Filed Weights   01/09/24 1831  Weight: 68.7 kg    Scheduled Meds:  atorvastatin  20 mg Oral Daily   Chlorhexidine Gluconate Cloth  6 each Topical Q0600   cyanocobalamin  1,000 mcg Oral Daily   enoxaparin (LOVENOX) injection  40 mg Subcutaneous QHS   feeding supplement  1 Container Oral TID with meals   lidocaine  2 patch Transdermal Q24H   multivitamin with minerals  1 tablet Oral Daily   rivastigmine  9.5 mg Transdermal Daily   Continuous Infusions:  Nutritional status     Body  mass index is 27.7 kg/m.  Data Reviewed:   CBC: Recent Labs  Lab 01/09/24 0626 01/10/24 0411 01/11/24 0334 01/12/24 0327  WBC 11.6* 9.1 8.3 12.2*  NEUTROABS 10.6*  --   --   --   HGB 12.3 11.9* 11.7* 12.4  HCT 38.5 38.7 38.0 39.2  MCV 97.2 101.6* 101.1* 98.5  PLT 187 142* 167 183   Basic Metabolic Panel: Recent Labs  Lab 01/09/24 0626 01/10/24 1217 01/11/24 0334 01/12/24 0327  NA 138 142 141 140  K 3.6 3.9 3.8 3.5  CL 101 107 104 103  CO2 26 29 31 28   GLUCOSE 144* 98 117* 96  BUN 20 13 13 15   CREATININE 0.79 0.60 0.68 0.71  CALCIUM 9.1 9.1 8.8* 8.9  MG  --   --  1.9 2.1  PHOS  --   --  3.6  --    GFR: Estimated  Creatinine Clearance: 57.7 mL/min (by C-G formula based on SCr of 0.71 mg/dL). Liver Function Tests: Recent Labs  Lab 01/09/24 0626  AST 24  ALT 23  ALKPHOS 61  BILITOT 0.7  PROT 6.7  ALBUMIN 4.2   Recent Labs  Lab 01/09/24 0626  LIPASE 50   No results for input(s): "AMMONIA" in the last 168 hours. Coagulation Profile: No results for input(s): "INR", "PROTIME" in the last 168 hours. Cardiac Enzymes: No results for input(s): "CKTOTAL", "CKMB", "CKMBINDEX", "TROPONINI" in the last 168 hours. BNP (last 3 results) No results for input(s): "PROBNP" in the last 8760 hours. HbA1C: No results for input(s): "HGBA1C" in the last 72 hours. CBG: No results for input(s): "GLUCAP" in the last 168 hours. Lipid Profile: No results for input(s): "CHOL", "HDL", "LDLCALC", "TRIG", "CHOLHDL", "LDLDIRECT" in the last 72 hours. Thyroid Function Tests: No results for input(s): "TSH", "T4TOTAL", "FREET4", "T3FREE", "THYROIDAB" in the last 72 hours. Anemia Panel: No results for input(s): "VITAMINB12", "FOLATE", "FERRITIN", "TIBC", "IRON", "RETICCTPCT" in the last 72 hours. Sepsis Labs: No results for input(s): "PROCALCITON", "LATICACIDVEN" in the last 168 hours.  No results found for this or any previous visit (from the past 240 hours).       Radiology Studies: DG Chest Port 1 View Result Date: 01/12/2024 CLINICAL DATA:  Shortness of breath.  Follow-up pneumothorax. EXAM: PORTABLE CHEST 1 VIEW COMPARISON:  Chest radiograph dated 01/11/2024. FINDINGS: Similar positioning of the left chest tube. Tiny left apical pneumothorax may be present. Bibasilar atelectasis. Stable cardiomediastinal silhouette. Left chest wall soft tissue emphysema. No acute osseous pathology. IMPRESSION: Similar positioning of the left chest tube. Tiny left apical pneumothorax may be present. Electronically Signed   By: Elgie Collard M.D.   On: 01/12/2024 10:22   DG CHEST PORT 1 VIEW Result Date: 01/11/2024 CLINICAL  DATA:  [id number] Chest tube in place [id number] EXAM: PORTABLE CHEST 1 VIEW COMPARISON:  January 11, 2024, January 10, 2024 FINDINGS: The cardiomediastinal silhouette is unchanged and enlarged in contour.LEFT-sided chest tube. No pleural effusion. LEFT-sided subcutaneous air. LEFT basilar platelike opacities most consistent with atelectasis. No definitive residual pneumothorax. IMPRESSION: LEFT-sided chest tube without definitive residual pneumothorax. Electronically Signed   By: Meda Klinefelter M.D.   On: 01/11/2024 14:19   DG CHEST PORT 1 VIEW Result Date: 01/11/2024 CLINICAL DATA:  [id number] Chest tube in place [id number] EXAM: PORTABLE CHEST 1 VIEW COMPARISON:  01/10/2024 FINDINGS: Left chest tube remains in place. No discernible pneumothorax. Mild left basilar atelectasis. Right lung remains clear. Heart size within normal limits. Left chest wall subcutaneous emphysema. IMPRESSION: Left  chest tube remains in place. No discernible pneumothorax. Electronically Signed   By: Duanne Guess D.O.   On: 01/11/2024 10:12   DG CHEST PORT 1 VIEW Result Date: 01/10/2024 CLINICAL DATA:  [id number] Chest tube in place [id number] EXAM: PORTABLE CHEST 1 VIEW COMPARISON:  January 10, 2024 FINDINGS: The cardiomediastinal silhouette is unchanged in contour.LEFT-sided chest tube. LEFT-sided subcutaneous air. No pleural effusion. No significant pneumothorax. LEFT basilar platelike opacities, likely atelectasis. IMPRESSION: LEFT-sided chest tube. No significant pneumothorax. Electronically Signed   By: Meda Klinefelter M.D.   On: 01/10/2024 17:20           LOS: 2 days   Time spent= 35 mins    Miguel Rota, MD [facility]  If 7PM-7AM, please contact night-coverage  01/12/2024, 12:05 PM

## 2024-01-13 ENCOUNTER — Ambulatory Visit
Admission: RE | Admit: 2024-01-13 | Discharge: 2024-01-13 | Disposition: A | Discharge status: Home / Self Care | Source: Ambulatory Visit | Attending: Radiation Oncology | Admitting: Radiation Oncology

## 2024-01-13 ENCOUNTER — Ambulatory Visit

## 2024-01-13 ENCOUNTER — Telehealth: Payer: Self-pay

## 2024-01-13 DIAGNOSIS — S270XXA Traumatic pneumothorax, initial encounter: Secondary | ICD-10-CM | POA: Diagnosis not present

## 2024-01-13 DIAGNOSIS — J9311 Primary spontaneous pneumothorax: Secondary | ICD-10-CM | POA: Diagnosis not present

## 2024-01-13 LAB — BASIC METABOLIC PANEL WITH GFR
Anion gap: 7 (ref 5–15)
BUN: 15 mg/dL (ref 8–23)
CO2: 29 mmol/L (ref 22–32)
Calcium: 8.6 mg/dL — ABNORMAL LOW (ref 8.9–10.3)
Chloride: 102 mmol/L (ref 98–111)
Creatinine, Ser: 0.63 mg/dL (ref 0.44–1.00)
GFR, Estimated: >60 mL/min (ref >60)
Glucose, Bld: 115 mg/dL — ABNORMAL HIGH (ref 70–99)
Potassium: 3.6 mmol/L (ref 3.5–5.1)
Sodium: 138 mmol/L (ref 135–145)

## 2024-01-13 LAB — MAGNESIUM: Magnesium: 2 mg/dL (ref 1.7–2.4)

## 2024-01-13 LAB — CBC
HCT: 38 % (ref 36.0–46.0)
Hemoglobin: 12 g/dL (ref 12.0–15.0)
MCH: 31.3 pg (ref 26.0–34.0)
MCHC: 31.6 g/dL (ref 30.0–36.0)
MCV: 99 fL (ref 80.0–100.0)
Platelets: 187 10*3/uL (ref 150–400)
RBC: 3.84 MIL/uL — ABNORMAL LOW (ref 3.87–5.11)
RDW: 14.3 % (ref 11.5–15.5)
WBC: 13.5 10*3/uL — ABNORMAL HIGH (ref 4.0–10.5)
nRBC: 0 % (ref 0.0–0.2)

## 2024-01-13 NOTE — Progress Notes (Signed)
 PT Cancellation Note  Patient Details Name: Lindsay Mason MRN: [id number] DOB: 1951/07/14   Cancelled Treatment:    Reason Eval/Treat Not Completed:  Attempted PT tx session on today-RN reported pt just got back in bed. Will check back as schedule allows.    Faye Ramsay, PT Acute Rehabilitation  Office: [phone]

## 2024-01-13 NOTE — TOC Progression Note (Signed)
 Transition of Care [facility]) - Progression Note   Patient Details  Name: Lindsay Mason MRN: [id number] Date of Birth: Dec 17, 1950  [facility]) CM/SW Contact  Ewing Schlein, LCSW Phone Number: 01/13/2024, 3:17 PM  Clinical Narrative: Patient received the following bed offers:  [facility] [address] [place], [place] [address] [phone] Overall rating ??? Average  [facility] [address] [place], [place] [address] [phone] Overall rating ? Much below average  [facility] [address] [place], [place] [address] [phone] Overall rating ?? Below average  Bed offers printed off and placed in patient's room with sitter per daughter's request. Daughter to review bed offers later today.  Expected Discharge Plan: Skilled Nursing Facility Barriers to Discharge: Continued Medical Work up, SNF Pending bed offer, English as a second language teacher  Expected Discharge Plan and Services In-house Referral: Clinical Social Work Post Acute Care Choice: Skilled Nursing Facility Living arrangements for the past 2 months: Single Family Home             DME Arranged: N/A DME Agency: NA HH Arranged: NA HH Agency: NA  Social Determinants of Health (SDOH) Interventions SDOH Screenings   Food Insecurity: No Food Insecurity (01/09/2024)  Housing: Low Risk  (01/09/2024)  Transportation Needs: No Transportation Needs (01/09/2024)  Utilities: Not At Risk (01/09/2024)  Alcohol Screen: Low Risk  (05/01/2023)  Depression (PHQ2-9): Medium Risk (12/05/2023)  Financial Resource Strain: Low Risk  (05/01/2023)  Physical Activity: Sufficiently Active (05/01/2023)  Social Connections: Socially Integrated (01/09/2024)  Stress: No Stress Concern Present (05/01/2023)  Tobacco Use: Medium Risk (01/09/2024)  Health Literacy: Adequate Health Literacy (05/01/2023)   Readmission Risk  Interventions    01/11/2024   10:01 AM  Readmission Risk Prevention Plan  Post Dischage Appt Complete  Medication Screening Complete  Transportation Screening Complete

## 2024-01-13 NOTE — TOC Progression Note (Signed)
 Transition of Care [facility]) - Progression Note   Patient Details  Name: Lindsay Mason MRN: [id number] Date of Birth: May 12, 1951  Transition [facility]) CM/SW Contact  Ewing Schlein, LCSW Phone Number: 01/13/2024, 10:20 AM  Clinical Narrative: PT/OT evaluations recommended SNF. CSW spoke with daughter, Angelica Chessman, regarding recommendation as patient is currently oriented x1. Daughter agreeable to SNF and requested that a list of bed offers be left in the patient's room for her to review after work. FL2 done; PASRR received. Initial referral faxed out in hub. TOC awaiting bed offers.  Expected Discharge Plan: Skilled Nursing Facility Barriers to Discharge: Continued Medical Work up, SNF Pending bed offer, English as a second language teacher  Expected Discharge Plan and Services In-house Referral: Clinical Social Work Post Acute Care Choice: Skilled Nursing Facility Living arrangements for the past 2 months: Single Family Home            DME Arranged: N/A DME Agency: NA HH Arranged: NA HH Agency: NA  Social Determinants of Health (SDOH) Interventions SDOH Screenings   Food Insecurity: No Food Insecurity (01/09/2024)  Housing: Low Risk  (01/09/2024)  Transportation Needs: No Transportation Needs (01/09/2024)  Utilities: Not At Risk (01/09/2024)  Alcohol Screen: Low Risk  (05/01/2023)  Depression (PHQ2-9): Medium Risk (12/05/2023)  Financial Resource Strain: Low Risk  (05/01/2023)  Physical Activity: Sufficiently Active (05/01/2023)  Social Connections: Socially Integrated (01/09/2024)  Stress: No Stress Concern Present (05/01/2023)  Tobacco Use: Medium Risk (01/09/2024)  Health Literacy: Adequate Health Literacy (05/01/2023)   Readmission Risk Interventions    01/11/2024   10:01 AM  Readmission Risk Prevention Plan  Post Dischage Appt Complete  Medication Screening Complete  Transportation Screening Complete

## 2024-01-13 NOTE — Progress Notes (Signed)
 PROGRESS NOTE    Lindsay Mason  ZOX:[id number] DOB: 12/17/1950 DOA: 01/09/2024 PCP: Eustaquio Boyden, MD    Brief Narrative:  73 year old with history of tobacco use, COPD on room air, mild dementia, right lower lobectomy admitted to the hospital for left-sided pneumothorax.  Patient was seen by pulmonary team, chest tube was inserted.  Chest tube management per pulmonary team.  Eventually chest tube was removed on 3/31 which was uneventful.  Repeat chest x-ray was overall stable.  Foley catheter was required due to persistent urinary retention. Overall nonbreathing remained stable.  Occasionally she gets moments of anxiety and chest discomfort but this is mostly noncardiac.  She remains 100% on room air-2 L. PT/OT has recommended SNF, currently awaiting placement.   Assessment & Plan:  Active Problems:   * No active hospital problems. 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 - Chest tube placed by pulmonary, management per their service. Chest tube removed 3/31.  Off-and-on pain is causing some anxiety. - Has outpatient radiation oncology appointment on April 1 which she can follow  Urinary retention - Due to recurrent urinary retention, Foley catheter placed 3/31.  History of COPD - Bronchodilators as needed  History of GERD - As needed Pepcid  Dementia with behavioral disturbances - On outpatient rivastigmine.  IV as needed Zyprexa  Hyperlipidemia -Statin  PT/OT-SNF.  TOC consulted   DVT prophylaxis: Lovenox    Code Status: Full Code Family Communication: Family uptodate SNF placement   Subjective: Seen at bedside.  Off-and-on having some agitation but no other complaints Overall poor appetite  Examination:  General exam: Appears calm and comfortable Respiratory system: Clear to  auscultation. Respiratory effort normal. Poor insp effort Cardiovascular system: S1 & S2 heard, RRR. No JVD, murmurs, rubs, gallops or clicks. No pedal edema. Gastrointestinal system: Abdomen is nondistended, soft and nontender. No organomegaly or masses felt. Normal bowel sounds heard. Central nervous system: Alert and oriented to name Extremities: Symmetric4  x 5 power. Skin: No rashes, lesions or ulcers Psychiatry: Judgement and insight appea poor                Diet Orders (From admission, onward)     Start     Ordered   01/09/24 1020  Diet regular Room service appropriate? Yes; Fluid consistency: Thin  Diet effective now       Question Answer Comment  Room service appropriate? Yes   Fluid consistency: Thin      01/09/24 1019            Objective: Vitals:   01/12/24 1333 01/12/24 2024 01/13/24 0427 01/13/24 0754  BP: 113/74 104/60 111/72   Pulse: 88 99 87   Resp: 20 17 18    Temp: 97.9 F (36.6 C) 98.6 F (37 C) 98 F (36.7 C)   TempSrc: Oral Oral Oral   SpO2: 97% 94% 100% 90%  Weight:      Height:        Intake/Output Summary (Last 24 hours) at 01/13/2024 1243 Last data filed at 01/13/2024 1214 Gross per 24 hour  Intake 507 ml  Output 850 ml  Net -343 ml   Filed Weights   01/09/24 1831  Weight: 68.7 kg    Scheduled Meds:  atorvastatin  20 mg Oral Daily   Chlorhexidine Gluconate Cloth  6 each Topical Q0600   cyanocobalamin  1,000 mcg Oral Daily   enoxaparin (LOVENOX) injection  40 mg Subcutaneous QHS   feeding supplement  1 Container Oral TID with meals   lidocaine  2 patch Transdermal Q24H   multivitamin with minerals  1 tablet Oral Daily   rivastigmine  9.5 mg Transdermal Daily   Continuous Infusions:  Nutritional status     Body mass index is 27.7 kg/m.  Data Reviewed:   CBC: Recent Labs  Lab 01/09/24 0626 01/10/24 0411 01/11/24 0334 01/12/24 0327 01/13/24 0403  WBC 11.6* 9.1 8.3 12.2* 13.5*  NEUTROABS 10.6*  --   --   --    --   HGB 12.3 11.9* 11.7* 12.4 12.0  HCT 38.5 38.7 38.0 39.2 38.0  MCV 97.2 101.6* 101.1* 98.5 99.0  PLT 187 142* 167 183 187   Basic Metabolic Panel: Recent Labs  Lab 01/09/24 0626 01/10/24 1217 01/11/24 0334 01/12/24 0327 01/13/24 0403  NA 138 142 141 140 138  K 3.6 3.9 3.8 3.5 3.6  CL 101 107 104 103 102  CO2 26 29 31 28 29   GLUCOSE 144* 98 117* 96 115*  BUN 20 13 13 15 15   CREATININE 0.79 0.60 0.68 0.71 0.63  CALCIUM 9.1 9.1 8.8* 8.9 8.6*  MG  --   --  1.9 2.1 2.0  PHOS  --   --  3.6  --   --    GFR: Estimated Creatinine Clearance: 57.7 mL/min (by C-G formula based on SCr of 0.63 mg/dL). Liver Function Tests: Recent Labs  Lab 01/09/24 0626  AST 24  ALT 23  ALKPHOS 61  BILITOT 0.7  PROT 6.7  ALBUMIN 4.2   Recent Labs  Lab 01/09/24 0626  LIPASE 50   No results for input(s): "AMMONIA" in the last 168 hours. Coagulation Profile: No results for input(s): "INR", "PROTIME" in the last 168 hours. Cardiac Enzymes: No results for input(s): "CKTOTAL", "CKMB", "CKMBINDEX", "TROPONINI" in the last 168 hours. BNP (last 3 results) No results for input(s): "PROBNP" in the last 8760 hours. HbA1C: No results for input(s): "HGBA1C" in the last 72 hours. CBG: No results for input(s): "GLUCAP" in the last 168 hours. Lipid Profile: No results for input(s): "CHOL", "HDL", "LDLCALC", "TRIG", "CHOLHDL", "LDLDIRECT" in the last 72 hours. Thyroid Function Tests: No results for input(s): "TSH", "T4TOTAL", "FREET4", "T3FREE", "THYROIDAB" in the last 72 hours. Anemia Panel: No results for input(s): "VITAMINB12", "FOLATE", "FERRITIN", "TIBC", "IRON", "RETICCTPCT" in the last 72 hours. Sepsis Labs: No results for input(s): "PROCALCITON", "LATICACIDVEN" in the last 168 hours.  No results found for this or any previous visit (from the past 240 hours).       Radiology Studies: DG CHEST PORT 1 VIEW Result Date: 01/12/2024 CLINICAL DATA:  Shortness of breath. Chest pain. Chest  tube removal EXAM: PORTABLE CHEST 1 VIEW COMPARISON:  01/12/2024 earlier and older exams FINDINGS: Interval removal of the left-sided pigtail chest tube. Stable tiny left apical pneumothorax. Persistent left-sided chest wall gas. Tiny left effusion. Stable basilar opacity as well. Question tiny right effusion. No edema. Stable cardiopericardial silhouette with tortuous ectatic aorta and widened mediastinum. Overlapping cardiac leads. IMPRESSION: Interval removal of left chest tube. Tiny left apical pneumothorax remains. Chest wall gas is similar. Electronically Signed   By: Karen Kays M.D.   On: 01/12/2024 12:33   DG Chest Port 1 View Result Date: 01/12/2024 CLINICAL DATA:  Shortness of breath.  Follow-up pneumothorax. EXAM: PORTABLE CHEST 1 VIEW COMPARISON:  Chest radiograph dated 01/11/2024. FINDINGS: Similar positioning  of the left chest tube. Tiny left apical pneumothorax may be present. Bibasilar atelectasis. Stable cardiomediastinal silhouette. Left chest wall soft tissue emphysema. No acute osseous pathology. IMPRESSION: Similar positioning of the left chest tube. Tiny left apical pneumothorax may be present. Electronically Signed   By: Elgie Collard M.D.   On: 01/12/2024 10:22   DG CHEST PORT 1 VIEW Result Date: 01/11/2024 CLINICAL DATA:  [id number] Chest tube in place [id number] EXAM: PORTABLE CHEST 1 VIEW COMPARISON:  January 11, 2024, January 10, 2024 FINDINGS: The cardiomediastinal silhouette is unchanged and enlarged in contour.LEFT-sided chest tube. No pleural effusion. LEFT-sided subcutaneous air. LEFT basilar platelike opacities most consistent with atelectasis. No definitive residual pneumothorax. IMPRESSION: LEFT-sided chest tube without definitive residual pneumothorax. Electronically Signed   By: Meda Klinefelter M.D.   On: 01/11/2024 14:19           LOS: 3 days   Time spent= 35 mins    Miguel Rota, MD [facility]  If 7PM-7AM, please contact  night-coverage  01/13/2024, 12:43 PM

## 2024-01-13 NOTE — Progress Notes (Signed)
   NAME:  Lindsay Mason, MRN:  [id number], DOB:  09-28-1951, LOS: 3 ADMISSION DATE:  01/09/2024, CONSULTATION DATE:  01/09/2024 REFERRING MD:  Dr. Rennis Harding - EDP, CHIEF COMPLAINT: Abdominal pain  History of Present Illness:  Lindsay Mason is a 73 year old female with a past medical history significant for lung cancer s/p right lower lobectomy, COPD, ex-smoker, and early onset dementia who presented to the ED at [facility] 3/28 for complaints of abdominal pain.  Per patient pain started around 1 AM morning of admission and reports pain originates in the left flank and radiates to the left lower quadrant.  She reports 1 episode of vomiting this a.m. with normal bowel movements.  Also reports intermittent shortness of breath but no chest pain.  On ED arrival patient was seen hemodynamically stable with only mild tachypnea seen.  Lab work relatively unremarkable apart from mild leukocytosis with WBC 11.6.  Chest x-ray obtained and revealed large left pneumothorax with near complete atelectasis of left lung, EDP placed smallbore chest tube.  PCCM consulted for further management of chest tube.  Pertinent  Medical History  Lung cancer s/p right lower lobectomy, COPD, ex-smoker, and early onset dementia  Significant Hospital Events: Including procedures, antibiotic start and stop dates in addition to other pertinent events   3/28 presented for complaints of abdominal pain, found to have large left pneumothorax 3/31 chest x-ray no pneumothorax.  Chest x-ray removed 3/31  Interim History / Subjective:  Has no significant complaints today Feels well More interactive today  Objective   Blood pressure 111/72, pulse 87, temperature 98 F (36.7 C), temperature source Oral, resp. rate 18, height 5\' 2"  (1.575 m), weight 68.7 kg, SpO2 90%.        Intake/Output Summary (Last 24 hours) at 01/13/2024 1151 Last data filed at 01/13/2024 0935 Gross per 24 hour  Intake 447 ml  Output 850 ml  Net -403 ml   Filed  Weights   01/09/24 1831  Weight: 68.7 kg    Examination: General: Elderly, frail HEENT: Moist oral mucosa Neuro: Awake, arousable, pleasantly confused CV: S1-S2 appreciated PULM: Clear breath sounds bilaterally, reduced air movement at the bases GI: Bowel sounds appreciated Extremities: warm/dry, no edema  Skin: no rashes or lesions   Resolved Hospital Problem list     Assessment & Plan:   Large left pneumothorax likely secondary to trauma Multiple rib fractures -Chest tube was discontinued 01/12/2024 -Chest x-ray shows apical pneumothorax which has been stable  History of right lower lobe adenocarcinoma status post right lower lobe lobectomy in 2022 by Dr. Cliffton Asters -Has a new nodule, referred to radiation oncology for possible SBRT  Stable from a pulmonary perspective  Pulmonary will sign off  Virl Diamond, MD [facility] PCCM Pager: See Loretha Stapler

## 2024-01-13 NOTE — [place] FL2 (Signed)
 [place] MEDICAID FL2 LEVEL OF CARE FORM     IDENTIFICATION  Patient Name: Lindsay Mason Birthdate: Dec 06, 1950 Sex: female Admission Date (Current Location): 01/09/2024  [facility] and [place] Number:  Producer, television/film/video and Address:  [facility],  [address], [place] [address]      Provider Number: [id number]  Attending Physician Name and Address:  Miguel Rota, MD  Relative Name and Phone Number:  Nakshatra Klose (daughter) Ph: [phone]    Current Level of Care: Hospital Recommended Level of Care: Skilled Nursing Facility Prior Approval Number:    Date Approved/Denied:   PASRR Number: [id number]  Discharge Plan: SNF    Current Diagnoses: Patient Active Problem List   Diagnosis Date Noted   Pneumothorax 01/10/2024   PAD (peripheral artery disease) (HCC) 12/06/2023   UTI due to extended-spectrum beta lactamase (ESBL) producing Escherichia coli 12/06/2023   Acid reflux 11/06/2023   Sundowning 11/06/2023   Medicare annual wellness visit, subsequent 05/01/2022   Skin lesion 02/14/2021   Pulmonary nodule, left 01/22/2021   Vitamin B12 deficiency 01/22/2021   History of essential hypertension 01/21/2021   Alzheimer dementia with behavioral disturbance (HCC) 01/21/2021   History of primary malignant neoplasm of right lung 01/20/2020   S/P lobectomy of lung 01/20/2020   Advanced care planning/counseling discussion 06/26/2017   Degenerative scoliosis 06/26/2017   COPD with emphysema (HCC) 02/12/2016   Thoracic aortic atherosclerosis (HCC) 02/12/2016   Health maintenance examination 02/01/2016   Weight loss 02/01/2016   Microscopic colitis 06/05/2015   Abnormal MRI of head 10/02/2011   Headache 08/06/2011   Ex-smoker 04/30/2007   Osteopenia 04/30/2007    Orientation RESPIRATION BLADDER Height & Weight     Self  Normal Continent, Indwelling catheter Weight: 151 lb 7.3 oz (68.7 kg) Height:  5\' 2"  (157.5 cm)  BEHAVIORAL SYMPTOMS/MOOD  NEUROLOGICAL BOWEL NUTRITION STATUS      Continent Diet (Regular diet)  AMBULATORY STATUS COMMUNICATION OF NEEDS Skin   Extensive Assist Verbally Other (Comment) (Erythema: bilateral heels)                       Personal Care Assistance Level of Assistance  Bathing, Feeding, Dressing Bathing Assistance: Limited assistance Feeding assistance: Independent Dressing Assistance: Limited assistance     Functional Limitations Info  Sight, Hearing, Speech Sight Info: Adequate Hearing Info: Adequate Speech Info: Adequate    SPECIAL CARE FACTORS FREQUENCY  PT (By licensed PT), OT (By licensed OT)     PT Frequency: 5x's/week OT Frequency: 5x's/week            Contractures Contractures Info: Not present    Additional Factors Info  Code Status, Allergies Code Status Info: Full Allergies Info: Penicillins           Current Medications (01/13/2024):  This is the current hospital active medication list Current Facility-Administered Medications  Medication Dose Route Frequency Provider Last Rate Last Admin   acetaminophen (TYLENOL) tablet 650 mg  650 mg Oral Q6H PRN Kirby Crigler, Mir M, MD   650 mg at 01/12/24 0505   Or   acetaminophen (TYLENOL) suppository 650 mg  650 mg Rectal Q6H PRN Kirby Crigler, Mir M, MD       albuterol (PROVENTIL) (2.5 MG/3ML) 0.083% nebulizer solution 2.5 mg  2.5 mg Nebulization Q2H PRN Kirby Crigler, Mir M, MD   2.5 mg at 01/10/24 1706   atorvastatin (LIPITOR) tablet 20 mg  20 mg Oral Daily Kirby Crigler, Mir M, MD   20  mg at 01/13/24 0815   Chlorhexidine Gluconate Cloth 2 % PADS 6 each  6 each Topical Q0600 Stephania Fragmin C, MD   6 each at 01/13/24 0540   cyanocobalamin (VITAMIN B12) tablet 1,000 mcg  1,000 mcg Oral Daily Amin, Ankit C, MD   1,000 mcg at 01/13/24 0815   enoxaparin (LOVENOX) injection 40 mg  40 mg Subcutaneous QHS Kirby Crigler, Mir M, MD   40 mg at 01/12/24 2204   famotidine (PEPCID) tablet 20 mg  20 mg Oral Daily PRN Kirby Crigler, Mir M, MD        feeding supplement (BOOST / RESOURCE BREEZE) liquid 1 Container  1 Container Oral TID with meals Amin, Ankit C, MD   1 Container at 01/13/24 0818   guaiFENesin (ROBITUSSIN) 100 MG/5ML liquid 5 mL  5 mL Oral Q4H PRN Amin, Ankit C, MD       hydrALAZINE (APRESOLINE) injection 10 mg  10 mg Intravenous Q4H PRN Amin, Ankit C, MD       HYDROcodone-acetaminophen (NORCO/VICODIN) 5-325 MG per tablet 1-2 tablet  1-2 tablet Oral Q4H PRN Maryln Gottron, MD   1 tablet at 01/13/24 1610   hydrOXYzine (ATARAX) tablet 10 mg  10 mg Oral TID PRN Amin, Ankit C, MD   10 mg at 01/12/24 1639   lidocaine (LIDODERM) 5 % 2 patch  2 patch Transdermal Q24H Amin, Ankit C, MD   2 patch at 01/12/24 1045   metoprolol tartrate (LOPRESSOR) injection 5 mg  5 mg Intravenous Q4H PRN Amin, Ankit C, MD       multivitamin with minerals tablet 1 tablet  1 tablet Oral Daily Amin, Ankit C, MD   1 tablet at 01/13/24 0815   OLANZapine (ZYPREXA) injection 2.5 mg  2.5 mg Intramuscular Q6H PRN Amin, Ankit C, MD   2.5 mg at 01/11/24 1750   ondansetron (ZOFRAN) tablet 4 mg  4 mg Oral Q6H PRN Kirby Crigler, Mir M, MD       Or   ondansetron [facility]) injection 4 mg  4 mg Intravenous Q6H PRN Kirby Crigler, Mir M, MD   4 mg at 01/11/24 0200   rivastigmine (EXELON) 9.5 mg/24hr 9.5 mg  9.5 mg Transdermal Daily Kirby Crigler, Mir M, MD   9.5 mg at 01/13/24 0813   senna-docusate (Senokot-S) tablet 1 tablet  1 tablet Oral QHS PRN Amin, Ankit C, MD       traZODone (DESYREL) tablet 25 mg  25 mg Oral QHS PRN Maryln Gottron, MD   25 mg at 01/12/24 2203     Discharge Medications: Please see discharge summary for a list of discharge medications.  Relevant Imaging Results:  Relevant Lab Results:   Additional Information SSN: [id number]  Ewing Schlein, LCSW

## 2024-01-13 NOTE — Telephone Encounter (Signed)
 Per Zipporah Plants, PA-C this writer called numbers on file to reach Lindsay Mason,  about today;s consultation.  RN spoke with Lindsay Mason and he expressed that wife was in hospital and he didn't feel at this time she would be available for appointment.  Lindsay Mason daughter Lindsay Mason returned my call and also expressed that at this time Lindsay Mason wouldn't be available for consultation.  Lindsay Mason stated that Lindsay Mason may possibly going to skilled care and that consultation should be pushed out for a few weeks.  Lindsay Mason and her siblings are discussing next steps she will call us in a few weeks to reschedule.  Lindsay Bruning, PA-C made aware of this phone call.

## 2024-01-14 DIAGNOSIS — J9311 Primary spontaneous pneumothorax: Secondary | ICD-10-CM | POA: Diagnosis not present

## 2024-01-14 LAB — CBC
HCT: 37.6 % (ref 36.0–46.0)
Hemoglobin: 11.9 g/dL — ABNORMAL LOW (ref 12.0–15.0)
MCH: 30.8 pg (ref 26.0–34.0)
MCHC: 31.6 g/dL (ref 30.0–36.0)
MCV: 97.4 fL (ref 80.0–100.0)
Platelets: 211 10*3/uL (ref 150–400)
RBC: 3.86 MIL/uL — ABNORMAL LOW (ref 3.87–5.11)
RDW: 14.2 % (ref 11.5–15.5)
WBC: 10.7 10*3/uL — ABNORMAL HIGH (ref 4.0–10.5)
nRBC: 0 % (ref 0.0–0.2)

## 2024-01-14 LAB — BASIC METABOLIC PANEL WITH GFR
Anion gap: 9 (ref 5–15)
BUN: 17 mg/dL (ref 8–23)
CO2: 28 mmol/L (ref 22–32)
Calcium: 8.7 mg/dL — ABNORMAL LOW (ref 8.9–10.3)
Chloride: 103 mmol/L (ref 98–111)
Creatinine, Ser: 0.68 mg/dL (ref 0.44–1.00)
GFR, Estimated: >60 mL/min (ref >60)
Glucose, Bld: 108 mg/dL — ABNORMAL HIGH (ref 70–99)
Potassium: 3.6 mmol/L (ref 3.5–5.1)
Sodium: 140 mmol/L (ref 135–145)

## 2024-01-14 LAB — MAGNESIUM: Magnesium: 2 mg/dL (ref 1.7–2.4)

## 2024-01-14 MED ORDER — OLANZAPINE 5 MG PO TABS
5.0000 mg | ORAL_TABLET | Freq: Four times a day (QID) | ORAL | Status: DC | PRN
  Administered 2024-01-14 – 2024-01-15 (×2): 5 mg via ORAL
  Filled 2024-01-14 (×2): qty 1

## 2024-01-14 MED ORDER — HYDROCODONE-ACETAMINOPHEN 5-325 MG PO TABS: 1.0000 | ORAL_TABLET | ORAL | 0 refills | Status: DC | PRN

## 2024-01-14 MED ORDER — DOCUSATE SODIUM 100 MG PO CAPS
100.0000 mg | ORAL_CAPSULE | Freq: Two times a day (BID) | ORAL | Status: DC
  Administered 2024-01-14 – 2024-01-16 (×5): 100 mg via ORAL
  Filled 2024-01-14 (×5): qty 1

## 2024-01-14 MED ORDER — OLANZAPINE 10 MG IM SOLR: 2.5000 mg | Freq: Four times a day (QID) | INTRAMUSCULAR | Status: DC | PRN

## 2024-01-14 NOTE — Progress Notes (Signed)
 PROGRESS NOTE    Lindsay Mason  UJW:[id number] DOB: February 06, 1951 DOA: 01/09/2024 PCP: Eustaquio Boyden, MD    Brief Narrative:  73 year old with history of tobacco use, COPD on room air, mild dementia, right lower lobectomy admitted to the hospital for left-sided pneumothorax.  Patient was seen by pulmonary team, chest tube was inserted.  Chest tube management per pulmonary team.  Eventually chest tube was removed on 3/31 which was uneventful.  Repeat chest x-ray was overall stable.  Foley catheter was required due to persistent urinary retention. Overall nonbreathing remained stable.  Occasionally she gets moments of anxiety and chest discomfort but this is mostly noncardiac.  She remains 100% on room air-2 L. PT/OT has recommended SNF, currently awaiting placement.   Assessment & Plan:  Active Problems:   * No active hospital problems. 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 - Chest tube placed by pulmonary, management per their service. Chest tube removed 3/31.  Off-and-on pain is causing some anxiety. - Has outpatient radiation oncology appointment on April 1 which she can follow  Urinary retention - Due to recurrent urinary retention, Foley catheter placed 3/31. Will dc with foley catheter. Voiding trail at SNF on 01/17/24.   History of COPD - Bronchodilators as needed  History of GERD - As needed Pepcid  Dementia with behavioral disturbances - On outpatient rivastigmine.  IV or PO zyprexa. Dc sitter today.   Hyperlipidemia -Statin  PT/OT-SNF.  TOC consulted   DVT prophylaxis: Lovenox    Code Status: Full Code Family Communication: Family uptodate SNF placement, hopefully will be sitter free for next 24 hrs.    Subjective: No complaints this morning.  Pleasantly  confused  Examination:  General exam: Appears calm and comfortable Respiratory system: Clear to auscultation. Respiratory effort normal. Poor insp effort Cardiovascular system: S1 & S2 heard, RRR. No JVD, murmurs, rubs, gallops or clicks. No pedal edema. Gastrointestinal system: Abdomen is nondistended, soft and nontender. No organomegaly or masses felt. Normal bowel sounds heard. Central nervous system: Alert and oriented to name Extremities: Symmetric4  x 5 power. Skin: No rashes, lesions or ulcers Psychiatry: Judgement and insight appea poor                Diet Orders (From admission, onward)     Start     Ordered   01/09/24 1020  Diet regular Room service appropriate? Yes; Fluid consistency: Thin  Diet effective now       Question Answer Comment  Room service appropriate? Yes   Fluid consistency: Thin      01/09/24 1019            Objective: Vitals:   01/13/24 0427 01/13/24 0754 01/13/24 2024 01/14/24 0422  BP: 111/72  117/78 112/75  Pulse: 87  75 99  Resp: 18  19 15   Temp: 98 F (36.7 C)  (!) 100.6 F (38.1 C) 99.2 F (37.3 C)  TempSrc: Oral  Oral   SpO2: 100% 90% 96% 90%  Weight:      Height:        Intake/Output Summary (Last 24 hours) at 01/14/2024 1035 Last data filed at 01/14/2024 0858 Gross per 24 hour  Intake 540 ml  Output 800 ml  Net -260 ml   Filed Weights   01/09/24 1831  Weight: 68.7 kg    Scheduled Meds:  atorvastatin  20 mg Oral Daily   Chlorhexidine Gluconate Cloth  6 each Topical Q0600   cyanocobalamin  1,000 mcg Oral Daily   docusate sodium  100 mg Oral BID   enoxaparin (LOVENOX) injection  40 mg Subcutaneous QHS   feeding supplement  1 Container Oral TID with meals   lidocaine  2 patch Transdermal Q24H   multivitamin with minerals  1 tablet Oral Daily   rivastigmine  9.5 mg Transdermal Daily   Continuous Infusions:  Nutritional status     Body mass index is 27.7 kg/m.  Data Reviewed:   CBC: Recent Labs  Lab  01/09/24 0626 01/10/24 0411 01/11/24 0334 01/12/24 0327 01/13/24 0403 01/14/24 0359  WBC 11.6* 9.1 8.3 12.2* 13.5* 10.7*  NEUTROABS 10.6*  --   --   --   --   --   HGB 12.3 11.9* 11.7* 12.4 12.0 11.9*  HCT 38.5 38.7 38.0 39.2 38.0 37.6  MCV 97.2 101.6* 101.1* 98.5 99.0 97.4  PLT 187 142* 167 183 187 211   Basic Metabolic Panel: Recent Labs  Lab 01/10/24 1217 01/11/24 0334 01/12/24 0327 01/13/24 0403 01/14/24 0359  NA 142 141 140 138 140  K 3.9 3.8 3.5 3.6 3.6  CL 107 104 103 102 103  CO2 29 31 28 29 28   GLUCOSE 98 117* 96 115* 108*  BUN 13 13 15 15 17   CREATININE 0.60 0.68 0.71 0.63 0.68  CALCIUM 9.1 8.8* 8.9 8.6* 8.7*  MG  --  1.9 2.1 2.0 2.0  PHOS  --  3.6  --   --   --    GFR: Estimated Creatinine Clearance: 57.7 mL/min (by C-G formula based on SCr of 0.68 mg/dL). Liver Function Tests: Recent Labs  Lab 01/09/24 0626  AST 24  ALT 23  ALKPHOS 61  BILITOT 0.7  PROT 6.7  ALBUMIN 4.2   Recent Labs  Lab 01/09/24 0626  LIPASE 50   No results for input(s): "AMMONIA" in the last 168 hours. Coagulation Profile: No results for input(s): "INR", "PROTIME" in the last 168 hours. Cardiac Enzymes: No results for input(s): "CKTOTAL", "CKMB", "CKMBINDEX", "TROPONINI" in the last 168 hours. BNP (last 3 results) No results for input(s): "PROBNP" in the last 8760 hours. HbA1C: No results for input(s): "HGBA1C" in the last 72 hours. CBG: No results for input(s): "GLUCAP" in the last 168 hours. Lipid Profile: No results for input(s): "CHOL", "HDL", "LDLCALC", "TRIG", "CHOLHDL", "LDLDIRECT" in the last 72 hours. Thyroid Function Tests: No results for input(s): "TSH", "T4TOTAL", "FREET4", "T3FREE", "THYROIDAB" in the last 72 hours. Anemia Panel: No results for input(s): "VITAMINB12", "FOLATE", "FERRITIN", "TIBC", "IRON", "RETICCTPCT" in the last 72 hours. Sepsis Labs: No results for input(s): "PROCALCITON", "LATICACIDVEN" in the last 168 hours.  No results found for  this or any previous visit (from the past 240 hours).       Radiology Studies: DG CHEST PORT 1 VIEW Result Date: 01/12/2024 CLINICAL DATA:  Shortness of breath. Chest pain. Chest tube removal EXAM: PORTABLE CHEST 1 VIEW COMPARISON:  01/12/2024 earlier and older exams FINDINGS: Interval removal of the left-sided pigtail chest tube. Stable tiny left apical pneumothorax. Persistent left-sided chest wall gas. Tiny left effusion. Stable basilar opacity as well. Question tiny right effusion. No edema. Stable cardiopericardial silhouette with tortuous ectatic aorta and widened mediastinum. Overlapping cardiac leads. IMPRESSION: Interval removal of left chest tube. Tiny left apical pneumothorax remains. Chest wall gas is similar. Electronically Signed   By: Piedad Climes.D.  On: 01/12/2024 12:33           LOS: 4 days   Time spent= 35 mins    Miguel Rota, MD [facility]  If 7PM-7AM, please contact night-coverage  01/14/2024, 10:35 AM

## 2024-01-14 NOTE — TOC Progression Note (Signed)
 Transition of Care [facility]) - Progression Note   Patient Details  Name: Lindsay Mason MRN: [id number] Date of Birth: 1951-07-27  [facility] CM/SW Contact  Ewing Schlein, LCSW Phone Number: 01/14/2024, 10:14 AM  Clinical Narrative: CSW followed up with daughter regarding bed choice and [facility] was chosen. CSW confirmed bed with [place] in admissions and patient can be admitted tomorrow. CSW completed insurance authorization on Availity portal. Certification # is: [id number]. Patient has been approved for 01/15/24-01/21/24. CSW notified [place] and hospitalist regarding insurance authorization. TOC to follow.  Expected Discharge Plan: Skilled Nursing Facility Barriers to Discharge: Continued Medical Work up, SNF Pending bed offer, English as a second language teacher  Expected Discharge Plan and Services In-house Referral: Clinical Social Work Post Acute Care Choice: Skilled Nursing Facility Living arrangements for the past 2 months: Single Family Home          DME Arranged: N/A DME Agency: NA HH Arranged: NA HH Agency: NA  Social Determinants of Health (SDOH) Interventions SDOH Screenings   Food Insecurity: No Food Insecurity (01/09/2024)  Housing: Low Risk  (01/09/2024)  Transportation Needs: No Transportation Needs (01/09/2024)  Utilities: Not At Risk (01/09/2024)  Alcohol Screen: Low Risk  (05/01/2023)  Depression (PHQ2-9): Medium Risk (12/05/2023)  Financial Resource Strain: Low Risk  (05/01/2023)  Physical Activity: Sufficiently Active (05/01/2023)  Social Connections: Socially Integrated (01/09/2024)  Stress: No Stress Concern Present (05/01/2023)  Tobacco Use: Medium Risk (01/09/2024)  Health Literacy: Adequate Health Literacy (05/01/2023)   Readmission Risk Interventions    01/11/2024   10:01 AM  Readmission Risk Prevention Plan  Post Dischage Appt Complete  Medication Screening Complete  Transportation Screening Complete

## 2024-01-14 NOTE — Progress Notes (Addendum)
 Thoracic Location of Tumor / Histology: Lung nodule Left Lower Lobe  Patient presented as referral from Dr. Audie Box [facility]).  [name] Robinsons, NP   [name] Robinsons, NP  Dr. Audie Box   Dr. Brynda Greathouse   Past/Anticipated interventions by cardiothoracic surgery, if any: Dr. Brynda Greathouse In 2021 patient had a slowly changing some solid groundglass lesion within the right lower lobe and underwent a navigational bronchoscopy with dye marking and single anesthetic event for follow-up lobectomy by Dr. Cliffton Asters. This was diagnosed as a adenocarcinoma. She has had subsequent follow-up images. Serial imaging has showed 3 groundglass lesions within the left lower lobe. Lesion in the left lower lobe has showed some changes and progression to a larger size in comparison to imaging dating all the way back to 2017.    Follow-up CT scan completed beginning of October 2023 showed stability of the left lower lobe groundglass nodule, and plan was for continued surveillance imaging . Ct chest done 10/24/2023 shows the left lower lobe mass more prominent than previous CT imaging. Plan was for a PET scan and follow up to determine need for bronchoscopy with biopsy based on PET results. PET scan shows SUV of 2.5, which is identical to the SUV on PET of the RLL adenocarcinoma.    Past/Anticipated interventions by pulmonary if any:  [name] Robinsons, NP   Tobacco/Marijuana/Snuff/ETOH use: Former smoker, no alcohol use, and no drug use.  Signs/Symptoms Weight changes, if any: Weight loss 20-25 lbs Respiratory complaints, if any: No Hemoptysis, if any: No Pain issues, if any:  0/10  SAFETY ISSUES: Prior radiation? No Pacemaker/ICD?  No Possible current pregnancy?  Postmenopausal Is the patient on methotrexate? No  Current Complaints / other details:   Lives in skilled care facility all medications will go through their pharmacy.

## 2024-01-15 ENCOUNTER — Inpatient Hospital Stay (HOSPITAL_COMMUNITY)

## 2024-01-15 DIAGNOSIS — S270XXA Traumatic pneumothorax, initial encounter: Secondary | ICD-10-CM

## 2024-01-15 LAB — CBC
HCT: 35.8 % — ABNORMAL LOW (ref 36.0–46.0)
Hemoglobin: 11.4 g/dL — ABNORMAL LOW (ref 12.0–15.0)
MCH: 31.2 pg (ref 26.0–34.0)
MCHC: 31.8 g/dL (ref 30.0–36.0)
MCV: 98.1 fL (ref 80.0–100.0)
Platelets: 244 10*3/uL (ref 150–400)
RBC: 3.65 MIL/uL — ABNORMAL LOW (ref 3.87–5.11)
RDW: 14 % (ref 11.5–15.5)
WBC: 10.8 10*3/uL — ABNORMAL HIGH (ref 4.0–10.5)
nRBC: 0 % (ref 0.0–0.2)

## 2024-01-15 LAB — RESP PANEL BY RT-PCR (RSV, FLU A&B, COVID)  RVPGX2
Influenza A by PCR: NEGATIVE
Influenza B by PCR: NEGATIVE
Resp Syncytial Virus by PCR: NEGATIVE
SARS Coronavirus 2 by RT PCR: NEGATIVE

## 2024-01-15 LAB — RESPIRATORY PANEL BY PCR

## 2024-01-15 LAB — URINALYSIS, ROUTINE W REFLEX MICROSCOPIC
Bacteria, UA: NONE SEEN
Bilirubin Urine: NEGATIVE
Glucose, UA: NEGATIVE mg/dL
Ketones, ur: NEGATIVE mg/dL
Nitrite: NEGATIVE
Protein, ur: 100 mg/dL — AB
RBC / HPF: >50 RBC/hpf (ref 0–5)
Specific Gravity, Urine: 1.016 (ref 1.005–1.030)
WBC, UA: >50 WBC/hpf (ref 0–5)
pH: 8 (ref 5.0–8.0)

## 2024-01-15 LAB — BASIC METABOLIC PANEL WITH GFR
Anion gap: 8 (ref 5–15)
BUN: 20 mg/dL (ref 8–23)
CO2: 29 mmol/L (ref 22–32)
Calcium: 8.6 mg/dL — ABNORMAL LOW (ref 8.9–10.3)
Chloride: 103 mmol/L (ref 98–111)
Creatinine, Ser: 0.71 mg/dL (ref 0.44–1.00)
GFR, Estimated: >60 mL/min (ref >60)
Glucose, Bld: 99 mg/dL (ref 70–99)
Potassium: 3.4 mmol/L — ABNORMAL LOW (ref 3.5–5.1)
Sodium: 140 mmol/L (ref 135–145)

## 2024-01-15 LAB — GROUP A STREP BY PCR: Group A Strep by PCR: NOT DETECTED

## 2024-01-15 LAB — MAGNESIUM: Magnesium: 2 mg/dL (ref 1.7–2.4)

## 2024-01-15 MED ORDER — POTASSIUM CHLORIDE CRYS ER 20 MEQ PO TBCR
20.0000 meq | EXTENDED_RELEASE_TABLET | Freq: Once | ORAL | Status: AC
  Administered 2024-01-15: 20 meq via ORAL
  Filled 2024-01-15: qty 1

## 2024-01-15 NOTE — Progress Notes (Signed)
 PROGRESS NOTE Lindsay Mason  WUJ:[id number] DOB: 03/08/1951 DOA: 01/09/2024 PCP: Eustaquio Boyden, MD  Brief Narrative/Hospital Course: 73 year old with history of tobacco use, COPD on room air, mild dementia, right lower lobectomy admitted to the hospital for left-sided pneumothorax.  Patient was seen by pulmonary team, chest tube was inserted.  Chest tube management per pulmonary team.  Eventually chest tube was removed on 3/31 which was uneventful.  Repeat chest x-ray was overall stable.  Foley catheter was required due to persistent urinary retention. Overall nonbreathing remained stable.  Occasionally she gets moments of anxiety and chest discomfort but this is mostly noncardiac.  She remains 100% on room air-2 L. PT/OT has recommended SNF, currently awaiting placement.  Procedures/testing: Chest tube placement    Consultation: PCCM  Subjective: Seen and examined this morning Alert awake oriented x 1, complaints of sore throat, again febrile this morning Overnight low-grade temperature 100.5 last night.  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  Overall stable. Continue pain management for fracture, incentive spirometry. Follow-up with rad Onc as OP  Urinary retention s/p Foley catheter placed 3/31: Foley remains in place, check UA   Fever w/ sore throat: Check influenza RSV COVID UA chest x-ray  Hypokalemia: Replace orally.  COPD Not in exacerbation.  Continue nebs prn  GERD Cont prn pepcid  Dementia with behavioral disturbances Cont her rivastigmine , IV or PO zyprexa.  Off sitter 4/2 and doing well  Hyperlipidemia Cont statin  Deconditioning/debility: PT OT following, TOC consulted for  skilled nursing facility    DVT prophylaxis: enoxaparin (LOVENOX) injection 40 mg Start: 01/09/24 2200 Code Status:   Code Status: Full Code Family Communication: plan of care discussed with patient/RN at bedside. Patient status is: Remains hospitalized because of severity of illness Level of care: Med-Surg   Dispo: The patient is from: home            Anticipated disposition: SNF   Objective: Vitals last 24 hrs: Vitals:   01/14/24 1212 01/14/24 2023 01/15/24 0700 01/15/24 0945  BP: 111/73 112/75 (!) 143/95 131/89  Pulse: 89 100 98 97  Resp: 16 20 20 20   Temp: 99.4 F (37.4 C) (!) 100.5 F (38.1 C) 99.1 F (37.3 C) (!) 101.2 F (38.4 C)  TempSrc: Oral Oral Oral Oral  SpO2: 94% 91% 90% 90%  Weight:      Height:       Weight change:   Physical Examination: General exam: alert awake, not in distress, pleasantly confused older than stated age HEENT:Oral mucosa moist, Ear/Nose WNL grossly Respiratory system: Bilaterally diminished BS, no use of accessory muscle Cardiovascular system: S1 & S2 +. Gastrointestinal system: Abdomen soft, NT,ND,BS+ Nervous System: Alert, awake,following commands. Extremities: LE edema neg, moving arms, warm legs Skin: No rashes,warm. MSK: Normal muscle bulk/tone.   Medications reviewed:  Scheduled Meds:  atorvastatin  20 mg Oral Daily   Chlorhexidine Gluconate Cloth  6 each Topical Q0600   cyanocobalamin  1,000 mcg Oral Daily   docusate sodium  100 mg Oral BID   enoxaparin (LOVENOX) injection  40 mg Subcutaneous QHS   feeding supplement  1 Container Oral TID with meals   lidocaine  2 patch Transdermal Q24H   multivitamin with minerals  1 tablet  Oral Daily   potassium chloride  20 mEq Oral Once   rivastigmine  9.5 mg Transdermal Daily   Continuous Infusions:    Diet Order             Diet regular Room service appropriate? Yes; Fluid consistency: Thin  Diet effective now                  Intake/Output Summary (Last 24 hours) at  01/15/2024 1051 Last data filed at 01/14/2024 1700 Gross per 24 hour  Intake 145 ml  Output 150 ml  Net -5 ml   Net IO Since Admission: -4,341 mL [01/15/24 1051]  Wt Readings from Last 3 Encounters:  01/09/24 68.7 kg  12/26/23 65 kg  12/26/23 64.8 kg     Unresulted Labs (From admission, onward)     Start     Ordered   01/11/24 0500  Basic metabolic panel with GFR  Daily,   R      01/10/24 0744   01/11/24 0500  CBC  Daily,   R      01/10/24 0744   01/11/24 0500  Magnesium  Daily,   R      01/10/24 0744          Data Reviewed: I have personally reviewed following labs and imaging studies ( see epic result tab) CBC: Recent Labs  Lab 01/09/24 0626 01/10/24 0411 01/11/24 0334 01/12/24 0327 01/13/24 0403 01/14/24 0359 01/15/24 0349  WBC 11.6*   < > 8.3 12.2* 13.5* 10.7* 10.8*  NEUTROABS 10.6*  --   --   --   --   --   --   HGB 12.3   < > 11.7* 12.4 12.0 11.9* 11.4*  HCT 38.5   < > 38.0 39.2 38.0 37.6 35.8*  MCV 97.2   < > 101.1* 98.5 99.0 97.4 98.1  PLT 187   < > 167 183 187 211 244   < > = values in this interval not displayed.   CMP: Recent Labs  Lab 01/11/24 0334 01/12/24 0327 01/13/24 0403 01/14/24 0359 01/15/24 0349  NA 141 140 138 140 140  K 3.8 3.5 3.6 3.6 3.4*  CL 104 103 102 103 103  CO2 31 28 29 28 29   GLUCOSE 117* 96 115* 108* 99  BUN 13 15 15 17 20   CREATININE 0.68 0.71 0.63 0.68 0.71  CALCIUM 8.8* 8.9 8.6* 8.7* 8.6*  MG 1.9 2.1 2.0 2.0 2.0  PHOS 3.6  --   --   --   --    GFR: Estimated Creatinine Clearance: 57.7 mL/min (by C-G formula based on SCr of 0.71 mg/dL). Recent Labs  Lab 01/09/24 0626  AST 24  ALT 23  ALKPHOS 61  BILITOT 0.7  PROT 6.7  ALBUMIN 4.2   Recent Labs  Lab 01/09/24 0626  LIPASE 50   No results for input(s): "AMMONIA" in the last 168 hours.  Antimicrobials/Microbiology: Anti-infectives (From admission, onward)    None         Component Value Date/Time   SDES URINE, RANDOM 09/19/2011 1458   SPECREQUEST  NONE 09/19/2011 1458   CULT  09/19/2011 1458    Multiple bacterial morphotypes present, none predominant. Suggest appropriate recollection if clinically indicated.   REPTSTATUS 09/20/2011 FINAL 09/19/2011 1458     Radiology Studies: No results found.   LOS: 5 days   Total time spent in review of labs and imaging, patient evaluation, formulation of plan, documentation and communication with patient/family:  50 minutes  Lanae Boast, MD [facility] 01/15/2024, 10:51 AM

## 2024-01-15 NOTE — TOC Progression Note (Signed)
 Transition of Care [facility]) - Progression Note   Patient Details  Name: Lindsay Mason MRN: [id number] Date of Birth: 01/23/1951  Transition of Care [facility] CM/SW Contact  Ewing Schlein, LCSW Phone Number: 01/15/2024, 10:42 AM  Clinical Narrative: Patient not medically ready to discharge to [facility] today due to fever. CSW updated [place] in admissions. When patient is medically ready, she will go to room 609B and the number for report is [phone]. TOC to follow.  Expected Discharge Plan: Skilled Nursing Facility Barriers to Discharge: Continued Medical Work up, SNF Pending bed offer, English as a second language teacher  Expected Discharge Plan and Services In-house Referral: Clinical Social Work Post Acute Care Choice: Skilled Nursing Facility Living arrangements for the past 2 months: Single Family Home             DME Arranged: N/A DME Agency: NA HH Arranged: NA HH Agency: NA  Social Determinants of Health (SDOH) Interventions SDOH Screenings   Food Insecurity: No Food Insecurity (01/09/2024)  Housing: Low Risk  (01/09/2024)  Transportation Needs: No Transportation Needs (01/09/2024)  Utilities: Not At Risk (01/09/2024)  Alcohol Screen: Low Risk  (05/01/2023)  Depression (PHQ2-9): Medium Risk (12/05/2023)  Financial Resource Strain: Low Risk  (05/01/2023)  Physical Activity: Sufficiently Active (05/01/2023)  Social Connections: Socially Integrated (01/09/2024)  Stress: No Stress Concern Present (05/01/2023)  Tobacco Use: Medium Risk (01/09/2024)  Health Literacy: Adequate Health Literacy (05/01/2023)   Readmission Risk Interventions    01/11/2024   10:01 AM  Readmission Risk Prevention Plan  Post Dischage Appt Complete  Medication Screening Complete  Transportation Screening Complete

## 2024-01-15 NOTE — Plan of Care (Signed)
   Problem: Clinical Measurements: Goal: Ability to maintain clinical measurements within normal limits will improve Outcome: Progressing Goal: Will remain free from infection Outcome: Progressing Goal: Diagnostic test results will improve Outcome: Progressing Goal: Respiratory complications will improve Outcome: Progressing

## 2024-01-15 NOTE — Progress Notes (Signed)
 Physical Therapy Treatment Patient Details Name: Lindsay Mason MRN: [id number] DOB: 1951/08/05 Today's Date: 01/15/2024   History of Present Illness 73 yo female admitted with pneumothorax, atelectasis, multple rib fxs. Hx of dementia, lung ca s/p lobectomy, COPD.    PT Comments  Pt in bed when PT arrives, husband at bedside, nursing present as well. Pt agreeable to sit in chair. Pain level elevated however BP soft, nursing unable to premedicate. Inc cues for hand placement and sequencing. Pt able to come to bed with 2 assist due to pain and sequencing difficulties. Comes to stand with 2 person HHA. To progress mobility away from EOB, 2WW provided and pt is able to take slow turns shifting weight and advancing BLE in reciprocal pattern. Pt sits in recliner and positioned for comfort.     If plan is discharge home, recommend the following: A lot of help with bathing/dressing/bathroom;Assistance with cooking/housework;Assist for transportation;Help with stairs or ramp for entrance;A lot of help with walking and/or transfers   Can travel by private vehicle     No  Equipment Recommendations  Rolling walker (2 wheels)    Recommendations for Other Services       Precautions / Restrictions Precautions Precautions: Fall Recall of Precautions/Restrictions: Impaired Precaution/Restrictions Comments: monitor O2 Restrictions Weight Bearing Restrictions Per Provider Order: No     Mobility  Bed Mobility Overal bed mobility: Needs Assistance Bed Mobility: Sit to Supine       Sit to supine: Max assist, +2 for physical assistance, +2 for safety/equipment, HOB elevated   General bed mobility comments: Increased assist due to heavy need for cues and assistance with completing movements 2* anxiety    Transfers Overall transfer level: Needs assistance Equipment used: Rolling walker (2 wheels), 2 person hand held assist Transfers: Sit to/from Stand, Bed to chair/wheelchair/BSC Sit to Stand:  Min assist   Step pivot transfers: Min assist, +2 physical assistance, +2 safety/equipment       General transfer comment: Pt able to STS and stand without AD, for mobility away from EOB, 2WW placed and pt requires inc cues for hand placement and maintenance as well as navigation and sequencing for transfer    Ambulation/Gait                   Stairs             Wheelchair Mobility     Tilt Bed    Modified Rankin (Stroke Patients Only)       Balance Overall balance assessment: Needs assistance Sitting-balance support: No upper extremity supported, Feet supported Sitting balance-Leahy Scale: Fair     Standing balance support: During functional activity, Bilateral upper extremity supported, Reliant on assistive device for balance Standing balance-Leahy Scale: Fair                              Hotel manager: No apparent difficulties  Cognition Arousal: Alert Behavior During Therapy: Anxious, WFL for tasks assessed/performed   PT - Cognitive impairments: History of cognitive impairments                         Following commands: Impaired Following commands impaired: Follows one step commands inconsistently    Cueing Cueing Techniques: Verbal cues, Gestural cues, Tactile cues, Visual cues  Exercises      General Comments General comments (skin integrity, edema, etc.): pt has inc pain, soft BP  Pertinent Vitals/Pain Pain Assessment Pain Assessment: PAINAD Breathing: occasional labored breathing, short period of hyperventilation Negative Vocalization: none Facial Expression: smiling or inexpressive Body Language: tense, distressed pacing, fidgeting Consolability: distracted or reassured by voice/touch PAINAD Score: 3 Pain Location: ribcage Pain Descriptors / Indicators: Grimacing, Guarding Pain Intervention(s): Limited activity within patient's tolerance, Monitored during session,  Repositioned    Home Living                          Prior Function            PT Goals (current goals can now be found in the care plan section) Acute Rehab PT Goals PT Goal Formulation: Patient unable to participate in goal setting Time For Goal Achievement: 01/26/24 Potential to Achieve Goals: Good Progress towards PT goals: Progressing toward goals    Frequency    Min 2X/week      PT Plan      Co-evaluation              AM-PAC PT "6 Clicks" Mobility   Outcome Measure  Help needed turning from your back to your side while in a flat bed without using bedrails?: A Lot Help needed moving from lying on your back to sitting on the side of a flat bed without using bedrails?: A Lot Help needed moving to and from a bed to a chair (including a wheelchair)?: A Little Help needed standing up from a chair using your arms (e.g., wheelchair or bedside chair)?: A Little Help needed to walk in hospital room?: A Lot Help needed climbing 3-5 steps with a railing? : A Lot 6 Click Score: 14    End of Session Equipment Utilized During Treatment: Gait belt Activity Tolerance: Patient tolerated treatment well;Patient limited by pain Patient left: in chair;with call bell/phone within reach;with family/visitor present;with chair alarm set Nurse Communication: Mobility status PT Visit Diagnosis: Unsteadiness on feet (R26.81);Other abnormalities of gait and mobility (R26.89);Difficulty in walking, not elsewhere classified (R26.2);Pain Pain - Right/Left: Right Pain - part of body:  (trunk/ribs)     Time: 8413-2440 PT Time Calculation (min) (ACUTE ONLY): 14 min  Charges:    $Therapeutic Activity: 8-22 mins PT General Charges $$ ACUTE PT VISIT: 1 Visit                     Madaline Guthrie, PT Acute Rehabilitation Services Office: [phone] 01/15/2024    Lindsay Mason 01/15/2024, 3:03 PM

## 2024-01-16 DIAGNOSIS — S270XXA Traumatic pneumothorax, initial encounter: Secondary | ICD-10-CM | POA: Diagnosis not present

## 2024-01-16 LAB — CBC
HCT: 35.6 % — ABNORMAL LOW (ref 36.0–46.0)
Hemoglobin: 11.4 g/dL — ABNORMAL LOW (ref 12.0–15.0)
MCH: 30.7 pg (ref 26.0–34.0)
MCHC: 32 g/dL (ref 30.0–36.0)
MCV: 96 fL (ref 80.0–100.0)
Platelets: 268 10*3/uL (ref 150–400)
RBC: 3.71 MIL/uL — ABNORMAL LOW (ref 3.87–5.11)
RDW: 13.8 % (ref 11.5–15.5)
WBC: 12.7 10*3/uL — ABNORMAL HIGH (ref 4.0–10.5)
nRBC: 0 % (ref 0.0–0.2)

## 2024-01-16 LAB — BASIC METABOLIC PANEL WITH GFR
Anion gap: 10 (ref 5–15)
BUN: 18 mg/dL (ref 8–23)
CO2: 27 mmol/L (ref 22–32)
Calcium: 8.5 mg/dL — ABNORMAL LOW (ref 8.9–10.3)
Chloride: 103 mmol/L (ref 98–111)
Creatinine, Ser: 0.49 mg/dL (ref 0.44–1.00)
GFR, Estimated: >60 mL/min (ref >60)
Glucose, Bld: 102 mg/dL — ABNORMAL HIGH (ref 70–99)
Potassium: 3.8 mmol/L (ref 3.5–5.1)
Sodium: 140 mmol/L (ref 135–145)

## 2024-01-16 LAB — MAGNESIUM: Magnesium: 2 mg/dL (ref 1.7–2.4)

## 2024-01-16 MED ORDER — SODIUM CHLORIDE 0.9 % IV SOLN
1.0000 g | Freq: Every day | INTRAVENOUS | Status: DC
  Administered 2024-01-16 – 2024-01-17 (×2): 1 g via INTRAVENOUS
  Filled 2024-01-16 (×2): qty 10

## 2024-01-16 MED ORDER — TAMSULOSIN HCL 0.4 MG PO CAPS
0.4000 mg | ORAL_CAPSULE | Freq: Every day | ORAL | Status: DC
  Administered 2024-01-16 – 2024-01-17 (×2): 0.4 mg via ORAL
  Filled 2024-01-16 (×2): qty 1

## 2024-01-16 NOTE — Progress Notes (Signed)
 PROGRESS NOTE Lindsay Mason  ZOX:[id number] DOB: 1950/12/29 DOA: 01/09/2024 PCP: Eustaquio Boyden, MD  Brief Narrative/Hospital Course: 73 year old with history of tobacco use, COPD on room air, mild dementia, right lower lobectomy admitted to the hospital for left-sided pneumothorax.  Patient was seen by pulmonary team, chest tube was inserted.  Chest tube management per pulmonary team.  Eventually chest tube was removed on 3/31 which was uneventful.  Repeat chest x-ray was overall stable.  Foley catheter was required due to persistent urinary retention. Overall nonbreathing remained stable.  Occasionally she gets moments of anxiety and chest discomfort but this is mostly noncardiac.  She remains 100% on room air. PT/OT has recommended SNF, currently awaiting placement.  Procedures/testing: Chest tube placement    Consultation: PCCM  Subjective: Seen and examined this morning Daughter at the bedside patient has no complaint.  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  Respiratory status overall stable chest x-ray showing improvement further.  Continue pain management incentive spirometry PT OT Follow-up with rad Onc as OP.  Urinary retention s/p Foley catheter placed 3/31: Foley remains in place-discontinued 4/4 am, started Flomax check PVR  Fever w/ sore throat: Having low-grade fever now . SO FAR labs with mild leukocytosis otherwise stable-UA abnormal urine culture pending, COVID RSV RVP negative.  Chest x-ray trace residual left apical pneumothorax but further  decrease from previous persistent lung base opacities persistent left-sided chest wall gas unchanged. Start empiric ceftriaxone.will discontinue Foley catheter today  Hypokalemia: Replace orally.  COPD Not in exacerbation.  Continue nebs prn  GERD Cont prn pepcid  Dementia with behavioral disturbances Cont her rivastigmine , IV or PO zyprexa.  Off sitter 4/2 and doing well  Hyperlipidemia Cont statin  Deconditioning/debility: PT OT following, TOC consulted for skilled nursing facility    DVT prophylaxis: enoxaparin (LOVENOX) injection 40 mg Start: 01/09/24 2200 Code Status:   Code Status: Full Code Family Communication: plan of care discussed with patient/daughter t bedside. Patient status is: Remains hospitalized because of severity of illness Level of care: Med-Surg   Dispo: The patient is from: home            Anticipated disposition: SNF if able to void and remains afebrile overnight  Objective: Vitals last 24 hrs: Vitals:   01/15/24 1330 01/15/24 1400 01/15/24 1942 01/16/24 0347  BP: (!) 94/52 102/67 (!) 105/52 115/82  Pulse: 99 92 98 (!) 110  Resp: 18  20 20   Temp: 99.5 F (37.5 C)  100.3 F (37.9 C) 99.1 F (37.3 C)  TempSrc: Oral  Oral Oral  SpO2: 92%  93% 93%  Weight:      Height:       Weight change:   Physical Examination: General exam: alert awake, oriented to self people, pleasantly confused HEENT:Oral mucosa moist, Ear/Nose WNL grossly Respiratory system: Bilaterally clear BS,no use of accessory muscle Cardiovascular system: S1 & S2 +, No JVD. Gastrointestinal system: Abdomen soft,NT,ND, BS+ Nervous System: Alert, awake, moving all extremities,and following commands. Extremities: LE edema neg,distal peripheral pulses palpable and warm.  Skin: No rashes,no icterus. MSK: Normal muscle bulk,tone, power .   Medications reviewed:  Scheduled Meds:  atorvastatin  20 mg Oral Daily   Chlorhexidine Gluconate Cloth  6 each Topical Q0600    cyanocobalamin  1,000 mcg Oral Daily   docusate sodium  100 mg Oral BID   enoxaparin (LOVENOX) injection  40 mg Subcutaneous QHS   feeding supplement  1 Container Oral TID with meals   lidocaine  2 patch Transdermal Q24H   multivitamin with minerals  1 tablet Oral Daily   rivastigmine  9.5 mg Transdermal Daily   tamsulosin  0.4 mg Oral Daily   Continuous Infusions:  cefTRIAXone (ROCEPHIN)  IV 1 g (01/16/24 0846)    Diet Order             Diet regular Room service appropriate? Yes; Fluid consistency: Thin  Diet effective now                  Intake/Output Summary (Last 24 hours) at 01/16/2024 1313 Last data filed at 01/16/2024 0452 Gross per 24 hour  Intake 240 ml  Output 550 ml  Net -310 ml   Net IO Since Admission: -5,011 mL [01/16/24 1313]  Wt Readings from Last 3 Encounters:  01/09/24 68.7 kg  12/26/23 65 kg  12/26/23 64.8 kg     Unresulted Labs (From admission, onward)     Start     Ordered   01/15/24 2153  Culture, OB Urine  Add-on,   AD        01/15/24 2153   01/11/24 0500  Basic metabolic panel with GFR  Daily,   R      01/10/24 0744   01/11/24 0500  CBC  Daily,   R      01/10/24 0744   01/11/24 0500  Magnesium  Daily,   R      01/10/24 0744          Data Reviewed: I have personally reviewed following labs and imaging studies ( see epic result tab) CBC: Recent Labs  Lab 01/12/24 0327 01/13/24 0403 01/14/24 0359 01/15/24 0349 01/16/24 0339  WBC 12.2* 13.5* 10.7* 10.8* 12.7*  HGB 12.4 12.0 11.9* 11.4* 11.4*  HCT 39.2 38.0 37.6 35.8* 35.6*  MCV 98.5 99.0 97.4 98.1 96.0  PLT 183 187 211 244 268   CMP: Recent Labs  Lab 01/11/24 0334 01/12/24 0327 01/13/24 0403 01/14/24 0359 01/15/24 0349 01/16/24 0339  NA 141 140 138 140 140 140  K 3.8 3.5 3.6 3.6 3.4* 3.8  CL 104 103 102 103 103 103  CO2 31 28 29 28 29 27   GLUCOSE 117* 96 115* 108* 99 102*  BUN 13 15 15 17 20 18   CREATININE 0.68 0.71 0.63 0.68 0.71 0.49  CALCIUM 8.8* 8.9 8.6* 8.7*  8.6* 8.5*  MG 1.9 2.1 2.0 2.0 2.0 2.0  PHOS 3.6  --   --   --   --   --    GFR: Estimated Creatinine Clearance: 57.7 mL/min (by C-G formula based on SCr of 0.49 mg/dL). No results for input(s): "AST", "ALT", "ALKPHOS", "BILITOT", "PROT", "ALBUMIN" in the last 168 hours.  No results for input(s): "LIPASE", "AMYLASE" in the last 168 hours.  No results for input(s): "AMMONIA" in the last 168 hours.  Antimicrobials/Microbiology: Anti-infectives (From admission, onward)    Start     Dose/Rate Route Frequency Ordered Stop   01/16/24 0900  cefTRIAXone (ROCEPHIN) 1 g in sodium chloride 0.9 % 100 mL IVPB        1 g 200 mL/hr over 30 Minutes  Intravenous Daily 01/16/24 0748 01/19/24 0959         Component Value Date/Time   SDES URINE, RANDOM 09/19/2011 1458   SPECREQUEST NONE 09/19/2011 1458   CULT  09/19/2011 1458    Multiple bacterial morphotypes present, none predominant. Suggest appropriate recollection if clinically indicated.   REPTSTATUS 09/20/2011 FINAL 09/19/2011 1458     Radiology Studies: [facility] Chest Port 1 View Result Date: 01/15/2024 CLINICAL DATA:  Fever EXAM: PORTABLE CHEST 1 VIEW COMPARISON:  X-ray 01/12/2024 and older FINDINGS: Persistent left-sided chest wall gas. Trace left apical pneumothorax is further decreased today. Tiny pleural effusions. Bandlike lung base opacities. Stable enlarged cardiopericardial silhouette. Tortuous ectatic aorta. The IMPRESSION: The trace residual left apical pneumothorax but further decreased from previous. Persistent lung base opacities. Persistent left-sided chest wall gas Electronically Signed   By: Karen Kays M.D.   On: 01/15/2024 12:20     LOS: 6 days   Total time spent in review of labs and imaging, patient evaluation, formulation of plan, documentation and communication with patient/family: 35 minutes  Lanae Boast, MD [facility] 01/16/2024, 1:13 PM

## 2024-01-16 NOTE — Progress Notes (Signed)
 Occupational Therapy Treatment Patient Details Name: Lindsay Mason MRN: [id number] DOB: Sep 04, 1951 Today's Date: 01/16/2024   History of present illness 73 yo female admitted with pneumothorax, atelectasis, multple rib fxs. Hx of dementia, lung ca s/p lobectomy, COPD.   OT comments  Patient seen for skilled OT session this afternoon. Family present at start of session and patient noted to be much more awake, alert and able to participate in BADL's, assisted ambulation to bathroom since IE earlier week. No pain interference. Requires step by step and multimodal cuing for safety and redirection due to restlessness and cognitive deficits. Patient on RA and sats >96%. Patient was able to use RW to and from recliner for voiding with assistance and stood intermittently for simple grooming. Patient continues to require skilled OT in Acute hospital setting to progress basic functional skills. Patient will benefit from continued inpatient follow up therapy, <3 hours/day.        If plan is discharge home, recommend the following:  Two people to help with walking and/or transfers;Two people to help with bathing/dressing/bathroom;Assistance with cooking/housework;Assistance with feeding;Direct supervision/assist for medications management;Direct supervision/assist for financial management;Assist for transportation;Help with stairs or ramp for entrance;Supervision due to cognitive status         Precautions / Restrictions Precautions Precautions: Fall Recall of Precautions/Restrictions: Impaired Precaution/Restrictions Comments: monitor O2 Restrictions Weight Bearing Restrictions Per Provider Order: No       Mobility Bed Mobility Overal bed mobility:  (patient up in recliner for session)                  Transfers Overall transfer level: Needs assistance Equipment used: Rolling walker (2 wheels) Transfers: Sit to/from Stand, Bed to chair/wheelchair/BSC Sit to Stand: Mod assist Stand  pivot transfers: Mod assist   Step pivot transfers: Mod assist     General transfer comment: cues for all compinents of transfers and to navigate with RW 15 ft x 2     Balance Overall balance assessment: Needs assistance Sitting-balance support: No upper extremity supported, Feet supported Sitting balance-Leahy Scale: Fair     Standing balance support: During functional activity, Bilateral upper extremity supported, Reliant on assistive device for balance Standing balance-Leahy Scale: Fair                             ADL either performed or assessed with clinical judgement   ADL Overall ADL's : Needs assistance/impaired     Grooming: Wash/dry hands;Wash/dry face;Oral care;Applying deodorant;Brushing hair;Minimal assistance;Cueing for sequencing;Cueing for safety;Sitting                   Toilet Transfer: Moderate assistance;Rolling walker (2 wheels);Cueing for safety;Cueing for sequencing Toilet Transfer Details (indicate cue type and reason): step by step instructions and hand over hand assist to navigate Toileting- Clothing Manipulation and Hygiene: Moderate assistance;Cueing for safety;Cueing for sequencing;Sitting/lateral lean;Sit to/from stand       Functional mobility during ADLs:  (see above) General ADL Comments:  (see above for step by step cues and safety techniques as patient a high falls risk due to visual, cognitive and behavioral status (impulsivity with poor awareness))    Extremity/Trunk Assessment              Vision   Vision Assessment?: Yes;Vision impaired- to be further tested in functional context   Perception Perception Perception: Impaired Preception Impairment Details: Figure ground;Spatial orientation (poor depth perception when negotiating RW strightaways)   Praxis Praxis Praxis:  Impaired Praxis Impairment Details: Motor planning;Organization;Initiation   Communication Communication Communication: No apparent  difficulties   Cognition Arousal: Alert Behavior During Therapy: Restless, Anxious Cognition: History of cognitive impairments, Cognition impaired   Orientation impairments: Place, Time, Situation Awareness: Intellectual awareness impaired, Online awareness impaired Memory impairment (select all impairments): Short-term memory Attention impairment (select first level of impairment): Focused attention Executive functioning impairment (select all impairments): Initiation, Organization, Sequencing, Reasoning, Problem solving OT - Cognition Comments: patient much more awake and alert this session, less distraction by pain if any, needs step by step reirection and supervision for safety with NT notified pt up in recliner with chair alarm once session ended                 Following commands: Impaired Following commands impaired: Follows one step commands inconsistently      Cueing   Cueing Techniques: Verbal cues, Gestural cues, Tactile cues, Visual cues        General Comments use of measuring hat as per nursing for voiding post-catheter removal and nursing notified    Pertinent Vitals/ Pain       Pain Assessment Pain Assessment: PAINAD Faces Pain Scale: Hurts a little bit Breathing: normal Negative Vocalization: none Facial Expression: smiling or inexpressive Body Language: relaxed Consolability: no need to console PAINAD Score: 0 Pain Location: ribcage Pain Descriptors / Indicators: Discomfort Pain Intervention(s): Monitored during session, Repositioned   Frequency  Min 2X/week        Progress Toward Goals  OT Goals(current goals can now be found in the care plan section)  Progress towards OT goals: Progressing toward goals  Acute Rehab OT Goals Patient Stated Goal: to continue to improve OT Goal Formulation: With family Time For Goal Achievement: 01/26/24 Potential to Achieve Goals: Fair  Plan         AM-PAC OT "6 Clicks" Daily Activity     Outcome  Measure   Help from another person eating meals?: A Little Help from another person taking care of personal grooming?: A Little Help from another person toileting, which includes using toliet, bedpan, or urinal?: A Lot Help from another person bathing (including washing, rinsing, drying)?: A Lot Help from another person to put on and taking off regular upper body clothing?: A Lot Help from another person to put on and taking off regular lower body clothing?: A Lot 6 Click Score: 14    End of Session Equipment Utilized During Treatment: Gait belt;Rolling walker (2 wheels)  OT Visit Diagnosis: Unsteadiness on feet (R26.81);Muscle weakness (generalized) (M62.81);Cognitive communication deficit (R41.841);Low vision, both eyes (H54.2);Pain Symptoms and signs involving cognitive functions: Other cerebrovascular disease Pain - Right/Left: Left Pain - part of body:  (flank and ribs)   Activity Tolerance Patient tolerated treatment well   Patient Left in chair;with call bell/phone within reach;with chair alarm set   Nurse Communication Mobility status;Precautions;Other (comment) (voiding left in hat and patient left without visitor in room and NT agree for OT to leave with chair alarm set)        Time: 1610-9604 OT Time Calculation (min): 28 min  Charges: OT General Charges $OT Visit: 1 Visit OT Treatments $Self Care/Home Management : 23-37 mins  Cayce Quezada OT/L Acute Rehabilitation Department  [phone]  01/16/2024, 3:28 PM

## 2024-01-16 NOTE — TOC Progression Note (Signed)
 Transition of Care [facility]) - Progression Note   Patient Details  Name: Lindsay Mason MRN: [id number] Date of Birth: 05-Dec-1950  Transition of Care [facility]) CM/SW Contact  Ewing Schlein, LCSW Phone Number: 01/16/2024, 1:39 PM  Clinical Narrative: Patient not yet medically ready to discharge to SNF today. CSW confirmed with [place] at [place] that patient can be admitted tomorrow. TOC to follow.  Expected Discharge Plan: Skilled Nursing Facility Barriers to Discharge: Continued Medical Work up  Expected Discharge Plan and Services In-house Referral: Clinical Social Work Post Acute Care Choice: Skilled Nursing Facility Living arrangements for the past 2 months: Single Family Home             DME Arranged: N/A DME Agency: NA HH Arranged: NA HH Agency: NA  Social Determinants of Health (SDOH) Interventions SDOH Screenings   Food Insecurity: No Food Insecurity (01/09/2024)  Housing: Low Risk  (01/09/2024)  Transportation Needs: No Transportation Needs (01/09/2024)  Utilities: Not At Risk (01/09/2024)  Alcohol Screen: Low Risk  (05/01/2023)  Depression (PHQ2-9): Medium Risk (12/05/2023)  Financial Resource Strain: Low Risk  (05/01/2023)  Physical Activity: Sufficiently Active (05/01/2023)  Social Connections: Socially Integrated (01/09/2024)  Stress: No Stress Concern Present (05/01/2023)  Tobacco Use: Medium Risk (01/09/2024)  Health Literacy: Adequate Health Literacy (05/01/2023)   Readmission Risk Interventions    01/11/2024   10:01 AM  Readmission Risk Prevention Plan  Post Dischage Appt Complete  Medication Screening Complete  Transportation Screening Complete

## 2024-01-17 DIAGNOSIS — R531 Weakness: Secondary | ICD-10-CM | POA: Diagnosis not present

## 2024-01-17 DIAGNOSIS — K219 Gastro-esophageal reflux disease without esophagitis: Secondary | ICD-10-CM | POA: Diagnosis not present

## 2024-01-17 DIAGNOSIS — Z7401 Bed confinement status: Secondary | ICD-10-CM | POA: Diagnosis not present

## 2024-01-17 DIAGNOSIS — J449 Chronic obstructive pulmonary disease, unspecified: Secondary | ICD-10-CM | POA: Diagnosis not present

## 2024-01-17 DIAGNOSIS — C3492 Malignant neoplasm of unspecified part of left bronchus or lung: Secondary | ICD-10-CM | POA: Diagnosis not present

## 2024-01-17 DIAGNOSIS — G309 Alzheimer's disease, unspecified: Secondary | ICD-10-CM | POA: Diagnosis not present

## 2024-01-17 DIAGNOSIS — S270XXA Traumatic pneumothorax, initial encounter: Secondary | ICD-10-CM | POA: Diagnosis not present

## 2024-01-17 DIAGNOSIS — F03918 Unspecified dementia, unspecified severity, with other behavioral disturbance: Secondary | ICD-10-CM | POA: Diagnosis not present

## 2024-01-17 DIAGNOSIS — Z85118 Personal history of other malignant neoplasm of bronchus and lung: Secondary | ICD-10-CM | POA: Diagnosis not present

## 2024-01-17 DIAGNOSIS — S2242XD Multiple fractures of ribs, left side, subsequent encounter for fracture with routine healing: Secondary | ICD-10-CM | POA: Diagnosis not present

## 2024-01-17 DIAGNOSIS — E538 Deficiency of other specified B group vitamins: Secondary | ICD-10-CM | POA: Diagnosis not present

## 2024-01-17 DIAGNOSIS — J939 Pneumothorax, unspecified: Secondary | ICD-10-CM | POA: Diagnosis not present

## 2024-01-17 DIAGNOSIS — E785 Hyperlipidemia, unspecified: Secondary | ICD-10-CM | POA: Diagnosis not present

## 2024-01-17 DIAGNOSIS — J9811 Atelectasis: Secondary | ICD-10-CM | POA: Diagnosis not present

## 2024-01-17 DIAGNOSIS — Z743 Need for continuous supervision: Secondary | ICD-10-CM | POA: Diagnosis not present

## 2024-01-17 DIAGNOSIS — N390 Urinary tract infection, site not specified: Secondary | ICD-10-CM | POA: Diagnosis not present

## 2024-01-17 DIAGNOSIS — R2689 Other abnormalities of gait and mobility: Secondary | ICD-10-CM | POA: Diagnosis not present

## 2024-01-17 DIAGNOSIS — J439 Emphysema, unspecified: Secondary | ICD-10-CM | POA: Diagnosis not present

## 2024-01-17 DIAGNOSIS — M6281 Muscle weakness (generalized): Secondary | ICD-10-CM | POA: Diagnosis not present

## 2024-01-17 DIAGNOSIS — Z87891 Personal history of nicotine dependence: Secondary | ICD-10-CM | POA: Diagnosis not present

## 2024-01-17 DIAGNOSIS — C3432 Malignant neoplasm of lower lobe, left bronchus or lung: Secondary | ICD-10-CM | POA: Diagnosis not present

## 2024-01-17 DIAGNOSIS — I739 Peripheral vascular disease, unspecified: Secondary | ICD-10-CM | POA: Diagnosis not present

## 2024-01-17 DIAGNOSIS — F05 Delirium due to known physiological condition: Secondary | ICD-10-CM | POA: Diagnosis not present

## 2024-01-17 DIAGNOSIS — Z8679 Personal history of other diseases of the circulatory system: Secondary | ICD-10-CM | POA: Diagnosis not present

## 2024-01-17 DIAGNOSIS — R2681 Unsteadiness on feet: Secondary | ICD-10-CM | POA: Diagnosis not present

## 2024-01-17 LAB — BASIC METABOLIC PANEL WITH GFR
Anion gap: 10 (ref 5–15)
BUN: 18 mg/dL (ref 8–23)
CO2: 26 mmol/L (ref 22–32)
Calcium: 8.6 mg/dL — ABNORMAL LOW (ref 8.9–10.3)
Chloride: 107 mmol/L (ref 98–111)
Creatinine, Ser: 0.72 mg/dL (ref 0.44–1.00)
GFR, Estimated: >60 mL/min (ref >60)
Glucose, Bld: 96 mg/dL (ref 70–99)
Potassium: 3.6 mmol/L (ref 3.5–5.1)
Sodium: 143 mmol/L (ref 135–145)

## 2024-01-17 LAB — CBC
HCT: 36.6 % (ref 36.0–46.0)
Hemoglobin: 11.4 g/dL — ABNORMAL LOW (ref 12.0–15.0)
MCH: 31.1 pg (ref 26.0–34.0)
MCHC: 31.1 g/dL (ref 30.0–36.0)
MCV: 100 fL (ref 80.0–100.0)
Platelets: 295 10*3/uL (ref 150–400)
RBC: 3.66 MIL/uL — ABNORMAL LOW (ref 3.87–5.11)
RDW: 13.8 % (ref 11.5–15.5)
WBC: 9 10*3/uL (ref 4.0–10.5)
nRBC: 0 % (ref 0.0–0.2)

## 2024-01-17 LAB — MAGNESIUM: Magnesium: 2.1 mg/dL (ref 1.7–2.4)

## 2024-01-17 MED ORDER — TAMSULOSIN HCL 0.4 MG PO CAPS: 0.4000 mg | ORAL_CAPSULE | Freq: Every day | ORAL | Status: DC

## 2024-01-17 MED ORDER — CEFADROXIL 500 MG PO CAPS: 500.0000 mg | ORAL_CAPSULE | Freq: Two times a day (BID) | ORAL | Status: AC

## 2024-01-17 NOTE — Progress Notes (Signed)
 Mobility Specialist - Progress Note   01/17/24 0903  Mobility  Activity Ambulated with assistance in room;Transferred from bed to chair  Level of Assistance Contact guard assist, steadying assist  Assistive Device Front wheel walker  Distance Ambulated (ft) 10 ft  Range of Motion/Exercises Active Assistive  Activity Response Tolerated fair  Mobility Referral Yes  Mobility visit 1 Mobility  Mobility Specialist Start Time (ACUTE ONLY) 0845  Mobility Specialist Stop Time (ACUTE ONLY) [id number]  Mobility Specialist Time Calculation (min) (ACUTE ONLY) 18 min   Pt was found in bed trying to get up. C/o stomach pain. Assisted with transfer to recliner chair for breakfast. Required safety cues. At EOS was left on recliner chair with all needs met. Call bell in reach and chair alarm on.  Billey Chang Mobility Specialist

## 2024-01-17 NOTE — Discharge Summary (Signed)
 Physician Discharge Summary  Lindsay Mason NWG:[id number] DOB: Jul 14, 1951 DOA: 01/09/2024  PCP: Eustaquio Boyden, MD  Admit date: 01/09/2024 Discharge date: 01/17/2024 Recommendations for Outpatient Follow-up:  Follow up with PCP in 1 weeks-call for appointment Please obtain BMP/CBC in one week  Discharge Dispo: SNF Discharge Condition: Stable Code Status:   Code Status: Full Code Diet recommendation:  Diet Order             Diet regular Room service appropriate? Yes; Fluid consistency: Thin  Diet effective now                    Brief/Interim Summary: 73 year old with history of tobacco use, COPD on room air, mild dementia, right lower lobectomy admitted to the hospital for left-sided pneumothorax.  Patient was seen by pulmonary team, chest tube was inserted.  Chest tube management per pulmonary team.  Eventually chest tube was removed on 3/31 which was uneventful.  Repeat chest x-ray was overall stable.  Foley catheter was required due to persistent urinary retention. Overall nonbreathing remained stable.  Occasionally she gets moments of anxiety and chest discomfort but this is mostly noncardiac.  She remains 100% on room air. PT/OT has recommended SNF, currently awaiting placement.  She had episodes of fever, workup unremarkable, given empiric antibiotics Foley discontinued, and voiding well.  She is stable for discharge to skilled nursing facility 01/17/24  Procedures/testing: Chest tube placement    Consultation: PCCM  Subjective: Seen this aao to self people Son at bedside Doing nwell Afebrile and doing well. 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  Respiratory status overall stable chest x-ray showing improvement further.   Continue pain management incentive spirometry PT OT Follow-up with rad Onc as OP.  Urinary retention s/p Foley catheter placed 3/31: Foley discontinued 4/4, voiding well continue Flomax   Fever w/ sore throat: Having low-grade fever now . SO FAR labs with mild leukocytosis otherwise stable-UA abnormal but likely in the setting of Foley placement Foley has been discontinued.  At this time patient is doing well afebrile. COVID RSV RVP negative. URINE CULTURE NEGATIVE.  Chest x-ray trace residual left apical pneumothorax but further decrease from previous persistent lung base opacities persistent left-sided chest wall gas unchanged.  Complete empiric antibiotics   Hypokalemia: Resolved  COPD Not in exacerbation.  Continue nebs prn  GERD Cont prn pepcid  Dementia with behavioral disturbances Cont her rivastigmine , IV or PO zyprexa.  Off sitter 4/2 and doing well  Hyperlipidemia Cont statin  Deconditioning/debility: PT OT following, TOC consulted for skilled nursing facility   Discharge Exam: Vitals:   01/16/24 1939 01/17/24 0303  BP: 127/86 110/78  Pulse: (!) 103 91  Resp:    Temp: 98.6 F (37 C) 98.7 F (37.1 C)  SpO2: 100% 90%   General: Pt is alert, awake, not in acute distress Cardiovascular: RRR, S1/S2 +, no rubs, no gallops Respiratory: CTA bilaterally, no wheezing, no rhonchi Abdominal: Soft, NT, ND, bowel sounds + Extremities: no edema, no cyanosis  Discharge Instructions  Discharge Instructions     Ambulatory Referral for Lung Cancer Scre   Complete by: As directed    Discharge instructions   Complete by: As directed    Please call call MD or return to ER for similar or worsening recurring  problem that brought you to hospital or if any fever,nausea/vomiting,abdominal pain, uncontrolled pain, chest pain,  shortness of breath or any other alarming symptoms.  Please follow-up your doctor as instructed in a week time and call the office for  appointment.  Please avoid alcohol, smoking, or any other illicit substance and maintain healthy habits including taking your regular medications as prescribed.  You were cared for by a hospitalist during your hospital stay. If you have any questions about your discharge medications or the care you received while you were in the hospital after you are discharged, you can call the unit and ask to speak with the hospitalist on call if the hospitalist that took care of you is not available.  Once you are discharged, your primary care physician will handle any further medical issues. Please note that NO REFILLS for any discharge medications will be authorized once you are discharged, as it is imperative that you return to your primary care physician (or establish a relationship with a primary care physician if you do not have one) for your aftercare needs so that they can reassess your need for medications and monitor your lab values   Increase activity slowly   Complete by: As directed    No wound care   Complete by: As directed       Allergies as of 01/17/2024       Reactions   Penicillins Swelling   REACTION: swelling tongue - States has taken Cephalosporins without difficulty in past        Medication List     TAKE these medications    albuterol 108 (90 Base) MCG/ACT inhaler Commonly known as: VENTOLIN HFA Inhale 2 puffs into the lungs every 6 (six) hours as needed for wheezing or shortness of breath.   atorvastatin 20 MG tablet Commonly known as: LIPITOR Take 1 tablet (20 mg total) by mouth daily.   b complex vitamins capsule Take 1 capsule by mouth daily.   cefadroxil 500 MG capsule Commonly known as: DURICEF Take 1 capsule (500 mg total) by mouth 2 (two) times daily for 3 days.   donepezil 5 MG tablet Commonly known as: ARICEPT Take 5 mg by mouth at bedtime.   famotidine 20 MG tablet Commonly known as: Pepcid Take 1 tablet (20 mg total) by mouth daily as needed for  heartburn or indigestion. What changed: when to take this   HYDROcodone-acetaminophen 5-325 MG tablet Commonly known as: NORCO/VICODIN Take 1-2 tablets by mouth every 4 (four) hours as needed for moderate pain (pain score 4-6) or severe pain (pain score 7-10).   multivitamin tablet Take 1 tablet by mouth daily.   rivastigmine 9.5 mg/24hr Commonly known as: Exelon Place 1 patch (9.5 mg total) onto the skin daily. What changed: Another medication with the same name was removed. Continue taking this medication, and follow the directions you see here.   tamsulosin 0.4 MG Caps capsule Commonly known as: FLOMAX Take 1 capsule (0.4 mg total) by mouth daily after supper.        Contact information for after-discharge care     Destination     [facility] .   Service: Skilled Nursing Contact information: [address] [facility] Test Update Address [place] [address] [phone]                    Allergies  Allergen Reactions   Penicillins Swelling    REACTION: swelling tongue - States has taken Cephalosporins without difficulty  in past    The results of significant diagnostics from this hospitalization (including imaging, microbiology, ancillary and laboratory) are listed below for reference.    Microbiology: Recent Results (from the past 240 hours)  Resp panel by RT-PCR (RSV, Flu A&B, Covid) Anterior Nasal Swab     Status: None   Collection Time: 01/15/24 10:54 AM   Specimen: Anterior Nasal Swab  Result Value Ref Range Status   SARS Coronavirus 2 by RT PCR NEGATIVE NEGATIVE Final    Comment: (NOTE) SARS-CoV-2 target nucleic acids are NOT DETECTED.  The SARS-CoV-2 RNA is generally detectable in upper respiratory specimens during the acute phase of infection. The lowest concentration of SARS-CoV-2 viral copies this assay can detect is 138 copies/mL. A negative result does not preclude SARS-Cov-2 infection and should not be used as the  sole basis for treatment or other patient management decisions. A negative result may occur with  improper specimen collection/handling, submission of specimen other than nasopharyngeal swab, presence of viral mutation(s) within the areas targeted by this assay, and inadequate number of viral copies(<138 copies/mL). A negative result must be combined with clinical observations, patient history, and epidemiological information. The expected result is Negative.  Fact Sheet for Patients:  BloggerCourse.com  Fact Sheet for Healthcare Providers:  SeriousBroker.it  This test is no t yet approved or cleared by the [place] FDA and  has been authorized for detection and/or diagnosis of SARS-CoV-2 by FDA under an Emergency Use Authorization (EUA). This EUA will remain  in effect (meaning this test can be used) for the duration of the COVID-19 declaration under Section 564(b)(1) of the Act, 21 U.S.C.section 360bbb-3(b)(1), unless the authorization is terminated  or revoked sooner.       Influenza A by PCR NEGATIVE NEGATIVE Final   Influenza B by PCR NEGATIVE NEGATIVE Final    Comment: (NOTE) The Xpert Xpress SARS-CoV-2/FLU/RSV plus assay is intended as an aid in the diagnosis of influenza from Nasopharyngeal swab specimens and should not be used as a sole basis for treatment. Nasal washings and aspirates are unacceptable for Xpert Xpress SARS-CoV-2/FLU/RSV testing.  Fact Sheet for Patients: BloggerCourse.com  Fact Sheet for Healthcare Providers: SeriousBroker.it  This test is not yet approved or cleared by the [place] FDA and has been authorized for detection and/or diagnosis of SARS-CoV-2 by FDA under an Emergency Use Authorization (EUA). This EUA will remain in effect (meaning this test can be used) for the duration of the COVID-19 declaration under Section 564(b)(1) of the  Act, 21 U.S.C. section 360bbb-3(b)(1), unless the authorization is terminated or revoked.     Resp Syncytial Virus by PCR NEGATIVE NEGATIVE Final    Comment: (NOTE) Fact Sheet for Patients: BloggerCourse.com  Fact Sheet for Healthcare Providers: SeriousBroker.it  This test is not yet approved or cleared by the [place] FDA and has been authorized for detection and/or diagnosis of SARS-CoV-2 by FDA under an Emergency Use Authorization (EUA). This EUA will remain in effect (meaning this test can be used) for the duration of the COVID-19 declaration under Section 564(b)(1) of the Act, 21 U.S.C. section 360bbb-3(b)(1), unless the authorization is terminated or revoked.  Performed at [facility], [address], [place], [place] [address]   Respiratory (~20 pathogens) panel by PCR     Status: None   Collection Time: 01/15/24 10:54 AM   Specimen: Nasopharyngeal Swab; Respiratory  Result Value Ref Range Status   Adenovirus NOT DETECTED NOT DETECTED Final   Coronavirus 229E NOT  DETECTED NOT DETECTED Final    Comment: (NOTE) The Coronavirus on the Respiratory Panel, DOES NOT test for the novel  Coronavirus (2019 nCoV)    Coronavirus HKU1 NOT DETECTED NOT DETECTED Final   Coronavirus NL63 NOT DETECTED NOT DETECTED Final   Coronavirus OC43 NOT DETECTED NOT DETECTED Final   Metapneumovirus NOT DETECTED NOT DETECTED Final   Rhinovirus / Enterovirus NOT DETECTED NOT DETECTED Final   Influenza A NOT DETECTED NOT DETECTED Final   Influenza B NOT DETECTED NOT DETECTED Final   Parainfluenza Virus 1 NOT DETECTED NOT DETECTED Final   Parainfluenza Virus 2 NOT DETECTED NOT DETECTED Final   Parainfluenza Virus 3 NOT DETECTED NOT DETECTED Final   Parainfluenza Virus 4 NOT DETECTED NOT DETECTED Final   Respiratory Syncytial Virus NOT DETECTED NOT DETECTED Final   Bordetella pertussis NOT DETECTED NOT DETECTED Final    Bordetella Parapertussis NOT DETECTED NOT DETECTED Final   Chlamydophila pneumoniae NOT DETECTED NOT DETECTED Final   Mycoplasma pneumoniae NOT DETECTED NOT DETECTED Final    Comment: Performed at [facility], [address], [place], [place] [address]  Culture, [place] Urine     Status: None (Preliminary result)   Collection Time: 01/15/24 10:55 AM   Specimen: Urine, Clean Catch  Result Value Ref Range Status   Specimen Description   Final    URINE, CLEAN CATCH Performed at [facility], [address] [place], [place] [address]    Special Requests   Final    NONE Performed at [facility], [address] [place], [place] [address]    Culture   Final    CULTURE REINCUBATED FOR BETTER GROWTH Performed at [facility], [address], [place], [place] [address]    Report Status PENDING  Incomplete  Group A Strep by PCR     Status: None   Collection Time: 01/15/24 11:50 AM   Specimen: Throat; Sterile Swab  Result Value Ref Range Status   Group A Strep by PCR NOT DETECTED NOT DETECTED Final    Comment: Performed at [facility], [address], [place], [place] [address]    Procedures/Studies: DG Chest Port 1 View Result Date: 01/15/2024 CLINICAL DATA:  Fever EXAM: PORTABLE CHEST 1 VIEW COMPARISON:  X-ray 01/12/2024 and older FINDINGS: Persistent left-sided chest wall gas. Trace left apical pneumothorax is further decreased today. Tiny pleural effusions. Bandlike lung base opacities. Stable enlarged cardiopericardial silhouette. Tortuous ectatic aorta. The IMPRESSION: The trace residual left apical pneumothorax but further decreased from previous. Persistent lung base opacities. Persistent left-sided chest wall gas Electronically Signed   By: Karen Kays M.D.   On: 01/15/2024 12:20   DG CHEST PORT 1 VIEW Result Date: 01/12/2024 CLINICAL DATA:  Shortness of breath. Chest pain. Chest tube removal EXAM: PORTABLE CHEST 1  VIEW COMPARISON:  01/12/2024 earlier and older exams FINDINGS: Interval removal of the left-sided pigtail chest tube. Stable tiny left apical pneumothorax. Persistent left-sided chest wall gas. Tiny left effusion. Stable basilar opacity as well. Question tiny right effusion. No edema. Stable cardiopericardial silhouette with tortuous ectatic aorta and widened mediastinum. Overlapping cardiac leads. IMPRESSION: Interval removal of left chest tube. Tiny left apical pneumothorax remains. Chest wall gas is similar. Electronically Signed   By: Karen Kays M.D.   On: 01/12/2024 12:33   DG Chest Port 1 View Result Date: 01/12/2024 CLINICAL DATA:  Shortness of breath.  Follow-up pneumothorax. EXAM: PORTABLE CHEST 1 VIEW COMPARISON:  Chest  radiograph dated 01/11/2024. FINDINGS: Similar positioning of the left chest tube. Tiny left apical pneumothorax may be present. Bibasilar atelectasis. Stable cardiomediastinal silhouette. Left chest wall soft tissue emphysema. No acute osseous pathology. IMPRESSION: Similar positioning of the left chest tube. Tiny left apical pneumothorax may be present. Electronically Signed   By: Elgie Collard M.D.   On: 01/12/2024 10:22   DG CHEST PORT 1 VIEW Result Date: 01/11/2024 CLINICAL DATA:  [id number] Chest tube in place [id number] EXAM: PORTABLE CHEST 1 VIEW COMPARISON:  January 11, 2024, January 10, 2024 FINDINGS: The cardiomediastinal silhouette is unchanged and enlarged in contour.LEFT-sided chest tube. No pleural effusion. LEFT-sided subcutaneous air. LEFT basilar platelike opacities most consistent with atelectasis. No definitive residual pneumothorax. IMPRESSION: LEFT-sided chest tube without definitive residual pneumothorax. Electronically Signed   By: Meda Klinefelter M.D.   On: 01/11/2024 14:19   DG CHEST PORT 1 VIEW Result Date: 01/11/2024 CLINICAL DATA:  [id number] Chest tube in place [id number] EXAM: PORTABLE CHEST 1 VIEW COMPARISON:  01/10/2024 FINDINGS: Left chest tube remains in  place. No discernible pneumothorax. Mild left basilar atelectasis. Right lung remains clear. Heart size within normal limits. Left chest wall subcutaneous emphysema. IMPRESSION: Left chest tube remains in place. No discernible pneumothorax. Electronically Signed   By: Duanne Guess D.O.   On: 01/11/2024 10:12   DG CHEST PORT 1 VIEW Result Date: 01/10/2024 CLINICAL DATA:  [id number] Chest tube in place [id number] EXAM: PORTABLE CHEST 1 VIEW COMPARISON:  January 10, 2024 FINDINGS: The cardiomediastinal silhouette is unchanged in contour.LEFT-sided chest tube. LEFT-sided subcutaneous air. No pleural effusion. No significant pneumothorax. LEFT basilar platelike opacities, likely atelectasis. IMPRESSION: LEFT-sided chest tube. No significant pneumothorax. Electronically Signed   By: Meda Klinefelter M.D.   On: 01/10/2024 17:20   DG CHEST PORT 1 VIEW Result Date: 01/10/2024 CLINICAL DATA:  Chest tube in place. EXAM: PORTABLE CHEST 1 VIEW COMPARISON:  Chest x-rays from yesterday. FINDINGS: The patient is rotated to the right. Left-sided chest tube again noted. Unchanged small left apical pneumothorax. The heart size and mediastinal contours are within normal limits. Normal pulmonary vascularity. Minimal bibasilar atelectasis. No pleural effusion. Increasing subcutaneous emphysema in the left lower neck and lateral chest wall. IMPRESSION: 1. Unchanged small left apical pneumothorax with chest tube in place. 2. Increasing subcutaneous emphysema in the left lower neck and lateral chest wall. Electronically Signed   By: Obie Dredge M.D.   On: 01/10/2024 11:08   DG CHEST PORT 1 VIEW Result Date: 01/10/2024 CLINICAL DATA:  Follow-up chest tube placement EXAM: PORTABLE CHEST 1 VIEW COMPARISON:  01/09/2024 FINDINGS: Left-sided pigtail thoracostomy tube is noted with pigtail overlying the medial left upper lobe. Unchanged small residual left apical pneumothorax which measures roughly 1 cm. Small left pleural effusion and  left base atelectasis. Left chest wall subcutaneous emphysema appears increased from previous exam. Heart size and mediastinal contours are stable. IMPRESSION: 1. Stable position of left-sided chest tube. Unchanged small residual left apical pneumothorax. 2. Small left pleural effusion and left base atelectasis. 3. Increased left chest wall subcutaneous emphysema. Electronically Signed   By: Signa Kell M.D.   On: 01/10/2024 08:41   DG CHEST PORT 1 VIEW Result Date: 01/09/2024 CLINICAL DATA:  Chest tube in place. EXAM: PORTABLE CHEST 1 VIEW COMPARISON:  Same day. FINDINGS: Stable cardiomegaly. Left-sided chest tube has been advanced toward left lung apex. Small left apical pneumothorax is noted. Stable subcutaneous emphysema is seen over left lateral chest wall. Stable bibasilar subsegmental atelectasis. IMPRESSION: Left-sided chest  tube has been advanced toward left lung apex. Small left apical pneumothorax is noted. Stable left-sided subcutaneous emphysema. Electronically Signed   By: Lupita Raider M.D.   On: 01/09/2024 12:48   DG Chest Portable 1 View Result Date: 01/09/2024 CLINICAL DATA:  Pneumothorax, post chest tube placement EXAM: PORTABLE CHEST - 1 VIEW COMPARISON:  Radiograph and CT from earlier the same day FINDINGS: Interval placement of pigtail chest catheter laterally at the left lung base. proximal sideholes project external to the thoracic cage, but there has been significant resolution of the pneumothorax, small residual suspected at the apex. There is a small amount of regional subcutaneous emphysema. Left infrahilar consolidation/atelectasis. Left lung clear. Heart size and mediastinal contours are within normal limits. Aortic Atherosclerosis (ICD10-170.0). No effusion. Visualized bones unremarkable. IMPRESSION: 1. Significant resolution of left pneumothorax following chest tube placement. The chest tube side holes are partially external to the thoracic cage. 2. Left infrahilar  consolidation/atelectasis. Electronically Signed   By: Corlis Leak M.D.   On: 01/09/2024 10:35   DG Chest Portable 1 View Result Date: 01/09/2024 CLINICAL DATA:  Suspected pneumothorax on CT scan EXAM: PORTABLE CHEST 1 VIEW COMPARISON:  Earlier same day CT abdomen and pelvis, chest radiograph dated 06/02/2020 FINDINGS: Patient is rotated to the right. Near-complete atelectasis of the left lung secondary to large left pneumothorax. The heart size and mediastinal contours are within normal limits. Minimally displaced tenth rib fracture is better seen on same day CT. IMPRESSION: Large left pneumothorax with near-complete atelectasis of the left lung. Critical Value/emergent results were called by telephone at the time of interpretation on 01/09/2024 at 8:40 am to provider Estelle June, who verbally acknowledged these results. Electronically Signed   By: Agustin Cree M.D.   On: 01/09/2024 08:56   CT ABDOMEN PELVIS W CONTRAST Result Date: 01/09/2024 CLINICAL DATA:  Acute onset left-sided abdominal pain EXAM: CT ABDOMEN AND PELVIS WITH CONTRAST TECHNIQUE: Multidetector CT imaging of the abdomen and pelvis was performed using the standard protocol following bolus administration of intravenous contrast. RADIATION DOSE REDUCTION: This exam was performed according to the departmental dose-optimization program which includes automated exposure control, adjustment of the mA and/or kV according to patient size and/or use of iterative reconstruction technique. CONTRAST:  OMNIPAQUE IOHEXOL 300 MG/ML  SOLN COMPARISON:  Nuclear medicine PET dated 12/01/2023 FINDINGS: Lower chest: Near complete atelectasis of the left lung secondary to large left pneumothorax and trace pleural effusion. No substantial rightward mediastinal shift. Hepatobiliary: Subcentimeter hypodensity along peripheral segment 8 (4:23), too small to characterize but unchanged and likely a cyst. Go to adrenals nodular thickening of the left adrenal gland,  previously characterized as an adenoma. No right adrenal nodule. Normal gallbladder. Pancreas: No focal lesions or main ductal dilation. Spleen: Normal in size without focal abnormality. Adrenals/Urinary Tract: No adrenal nodules. No suspicious renal mass, calculi or hydronephrosis. No focal bladder wall thickening. Stomach/Bowel: Normal appearance of the stomach. No evidence of bowel wall thickening, distention, or inflammatory changes. Colonic diverticulosis without acute diverticulitis. Normal appendix. Vascular/Lymphatic: Aortic atherosclerosis. No enlarged abdominal or pelvic lymph nodes. Reproductive: Again seen are adjacent right adnexal simple-appearing cystic foci measuring up to 3.0 cm (4:67), not substantially changed dating back to 01/03/2020. No specific follow-up imaging recommended. No left adnexal mass. Other: No free fluid, fluid collection, or free air. Musculoskeletal: Minimally displaced tenth posterior left rib fracture (2:23). Cortical undulation of the left lateral ninth through eleventh ribs. Trace subcutaneous soft tissue stranding overlying the left posterior lower chest,  likely soft tissue contusion. IMPRESSION: 1. Large left pneumothorax with near complete atelectasis of the left lung. No substantial rightward mediastinal shift. 2. Minimally displaced tenth posterior left rib fracture. Cortical undulation of the left lateral ninth through eleventh ribs, which may represent nondisplaced fractures. 3. No acute abnormality in the abdomen or pelvis. 4.  Aortic Atherosclerosis (ICD10-I70.0). Critical Value/emergent results were called by telephone at the time of interpretation on 01/09/2024 at 8:40 am to provider Estelle June, who verbally acknowledged these results. Electronically Signed   By: Agustin Cree M.D.   On: 01/09/2024 08:54    Labs: BNP (last 3 results) No results for input(s): "BNP" in the last 8760 hours. Basic Metabolic Panel: Recent Labs  Lab 01/11/24 0334 01/12/24 0327  01/13/24 0403 01/14/24 0359 01/15/24 0349 01/16/24 0339 01/17/24 0357  NA 141   < > 138 140 140 140 143  K 3.8   < > 3.6 3.6 3.4* 3.8 3.6  CL 104   < > 102 103 103 103 107  CO2 31   < > 29 28 29 27 26   GLUCOSE 117*   < > 115* 108* 99 102* 96  BUN 13   < > 15 17 20 18 18   CREATININE 0.68   < > 0.63 0.68 0.71 0.49 0.72  CALCIUM 8.8*   < > 8.6* 8.7* 8.6* 8.5* 8.6*  MG 1.9   < > 2.0 2.0 2.0 2.0 2.1  PHOS 3.6  --   --   --   --   --   --    < > = values in this interval not displayed.   Recent Labs  Lab 01/13/24 0403 01/14/24 0359 01/15/24 0349 01/16/24 0339 01/17/24 0357  WBC 13.5* 10.7* 10.8* 12.7* 9.0  HGB 12.0 11.9* 11.4* 11.4* 11.4*  HCT 38.0 37.6 35.8* 35.6* 36.6  MCV 99.0 97.4 98.1 96.0 100.0  PLT 187 211 244 268 295  anemia work up No results for input(s): "VITAMINB12", "FOLATE", "FERRITIN", "TIBC", "IRON", "RETICCTPCT" in the last 72 hours. Urinalysis    Component Value Date/Time   COLORURINE YELLOW 01/15/2024 1055   APPEARANCEUR CLOUDY (A) 01/15/2024 1055   LABSPEC 1.016 01/15/2024 1055   PHURINE 8.0 01/15/2024 1055   GLUCOSEU NEGATIVE 01/15/2024 1055   GLUCOSEU NEGATIVE 12/05/2023 1128   HGBUR MODERATE (A) 01/15/2024 1055   BILIRUBINUR NEGATIVE 01/15/2024 1055   BILIRUBINUR negative 11/05/2023 1209   KETONESUR NEGATIVE 01/15/2024 1055   PROTEINUR 100 (A) 01/15/2024 1055   UROBILINOGEN 0.2 12/05/2023 1128   NITRITE NEGATIVE 01/15/2024 1055   LEUKOCYTESUR LARGE (A) 01/15/2024 1055   Sepsis Labs Recent Labs  Lab 01/14/24 0359 01/15/24 0349 01/16/24 0339 01/17/24 0357  WBC 10.7* 10.8* 12.7* 9.0   Microbiology Recent Results (from the past 240 hours)  Resp panel by RT-PCR (RSV, Flu A&B, Covid) Anterior Nasal Swab     Status: None   Collection Time: 01/15/24 10:54 AM   Specimen: Anterior Nasal Swab  Result Value Ref Range Status   SARS Coronavirus 2 by RT PCR NEGATIVE NEGATIVE Final    Comment: (NOTE) SARS-CoV-2 target nucleic acids are NOT  DETECTED.  The SARS-CoV-2 RNA is generally detectable in upper respiratory specimens during the acute phase of infection. The lowest concentration of SARS-CoV-2 viral copies this assay can detect is 138 copies/mL. A negative result does not preclude SARS-Cov-2 infection and should not be used as the sole basis for treatment or other patient management decisions. A negative result may occur with  improper  specimen collection/handling, submission of specimen other than nasopharyngeal swab, presence of viral mutation(s) within the areas targeted by this assay, and inadequate number of viral copies(<138 copies/mL). A negative result must be combined with clinical observations, patient history, and epidemiological information. The expected result is Negative.  Fact Sheet for Patients:  BloggerCourse.com  Fact Sheet for Healthcare Providers:  SeriousBroker.it  This test is no t yet approved or cleared by the [place] FDA and  has been authorized for detection and/or diagnosis of SARS-CoV-2 by FDA under an Emergency Use Authorization (EUA). This EUA will remain  in effect (meaning this test can be used) for the duration of the COVID-19 declaration under Section 564(b)(1) of the Act, 21 U.S.C.section 360bbb-3(b)(1), unless the authorization is terminated  or revoked sooner.       Influenza A by PCR NEGATIVE NEGATIVE Final   Influenza B by PCR NEGATIVE NEGATIVE Final    Comment: (NOTE) The Xpert Xpress SARS-CoV-2/FLU/RSV plus assay is intended as an aid in the diagnosis of influenza from Nasopharyngeal swab specimens and should not be used as a sole basis for treatment. Nasal washings and aspirates are unacceptable for Xpert Xpress SARS-CoV-2/FLU/RSV testing.  Fact Sheet for Patients: BloggerCourse.com  Fact Sheet for Healthcare Providers: SeriousBroker.it  This test is not yet  approved or cleared by the [place] FDA and has been authorized for detection and/or diagnosis of SARS-CoV-2 by FDA under an Emergency Use Authorization (EUA). This EUA will remain in effect (meaning this test can be used) for the duration of the COVID-19 declaration under Section 564(b)(1) of the Act, 21 U.S.C. section 360bbb-3(b)(1), unless the authorization is terminated or revoked.     Resp Syncytial Virus by PCR NEGATIVE NEGATIVE Final    Comment: (NOTE) Fact Sheet for Patients: BloggerCourse.com  Fact Sheet for Healthcare Providers: SeriousBroker.it  This test is not yet approved or cleared by the [place] FDA and has been authorized for detection and/or diagnosis of SARS-CoV-2 by FDA under an Emergency Use Authorization (EUA). This EUA will remain in effect (meaning this test can be used) for the duration of the COVID-19 declaration under Section 564(b)(1) of the Act, 21 U.S.C. section 360bbb-3(b)(1), unless the authorization is terminated or revoked.  Performed at [facility], [address], [place], [place] [address]   Respiratory (~20 pathogens) panel by PCR     Status: None   Collection Time: 01/15/24 10:54 AM   Specimen: Nasopharyngeal Swab; Respiratory  Result Value Ref Range Status   Adenovirus NOT DETECTED NOT DETECTED Final   Coronavirus 229E NOT DETECTED NOT DETECTED Final    Comment: (NOTE) The Coronavirus on the Respiratory Panel, DOES NOT test for the novel  Coronavirus (2019 nCoV)    Coronavirus HKU1 NOT DETECTED NOT DETECTED Final   Coronavirus NL63 NOT DETECTED NOT DETECTED Final   Coronavirus OC43 NOT DETECTED NOT DETECTED Final   Metapneumovirus NOT DETECTED NOT DETECTED Final   Rhinovirus / Enterovirus NOT DETECTED NOT DETECTED Final   Influenza A NOT DETECTED NOT DETECTED Final   Influenza B NOT DETECTED NOT DETECTED Final   Parainfluenza Virus 1 NOT DETECTED NOT  DETECTED Final   Parainfluenza Virus 2 NOT DETECTED NOT DETECTED Final   Parainfluenza Virus 3 NOT DETECTED NOT DETECTED Final   Parainfluenza Virus 4 NOT DETECTED NOT DETECTED Final   Respiratory Syncytial Virus NOT DETECTED NOT DETECTED Final   Bordetella pertussis NOT DETECTED NOT DETECTED Final   Bordetella Parapertussis NOT DETECTED NOT DETECTED Final  Chlamydophila pneumoniae NOT DETECTED NOT DETECTED Final   Mycoplasma pneumoniae NOT DETECTED NOT DETECTED Final    Comment: Performed at [facility], [address] [place], [place] [address]  Culture, [place] Urine     Status: None (Preliminary result)   Collection Time: 01/15/24 10:55 AM   Specimen: Urine, Clean Catch  Result Value Ref Range Status   Specimen Description   Final    URINE, CLEAN CATCH Performed at [facility], [address] [place], [place] [address]    Special Requests   Final    NONE Performed at [facility], [address] [place], [place] [address]    Culture   Final    CULTURE REINCUBATED FOR BETTER GROWTH Performed at [facility], [address] [place], [place] [address]    Report Status PENDING  Incomplete  Group A Strep by PCR     Status: None   Collection Time: 01/15/24 11:50 AM   Specimen: Throat; Sterile Swab  Result Value Ref Range Status   Group A Strep by PCR NOT DETECTED NOT DETECTED Final    Comment: Performed at [facility], [address] [place], [place] [address]     Time coordinating discharge: 35 minutes  SIGNED: Lanae Boast, MD  [facility] 01/17/2024, 9:56 AM  If 7PM-7AM, please contact night-coverage [email or web address]

## 2024-01-17 NOTE — TOC Transition Note (Signed)
 Transition of Care [facility]) - Discharge Note  Patient Details  Name: Lindsay Mason MRN: [id number] Date of Birth: 1951-05-03  Transition [facility]) CM/SW Contact:  Ewing Schlein, LCSW Phone Number: 01/17/2024, 10:57 AM  Clinical Narrative: Patient is medically stable to discharge to [facility] today. Patient will go to room 609B and the number for report is [phone]. Discharge summary, discharge orders, and SNF transfer report faxed to facility in hub. Medical necessity form done; PTAR scheduled. Discharge packet completed. Daughter, Angelica Chessman, notified of transportation being set up. RN updated. TOC signing off.  Final next level of care: Skilled Nursing Facility Barriers to Discharge: Barriers Resolved  Patient Goals and CMS Choice Patient states their goals for this hospitalization and ongoing recovery are:: Go to rehab CMS Medicare.gov Compare Post Acute Care list provided to:: Patient Represenative (must comment) Choice offered to / list presented to : Adult Children [place] ownership interest in [facility].provided to::  (NA)   Discharge Placement PASRR number recieved: 01/13/24    Patient chooses bed at: [facility] Patient to be transferred to facility by: PTAR Name of family member notified: Hideko Esselman (daughter) Patient and family notified of of transfer: 01/17/24  Discharge Plan and Services Additional resources added to the After Visit Summary for   In-house Referral: Clinical Social Work Post Acute Care Choice: Skilled Nursing Facility          DME Arranged: N/A DME Agency: NA HH Arranged: NA HH Agency: NA  Social Drivers of Health (SDOH) Interventions SDOH Screenings   Food Insecurity: No Food Insecurity (01/09/2024)  Housing: Low Risk  (01/09/2024)  Transportation Needs: No Transportation Needs (01/09/2024)  Utilities: Not At Risk (01/09/2024)  Alcohol Screen: Low Risk  (05/01/2023)  Depression (PHQ2-9): Medium Risk (12/05/2023)  Financial  Resource Strain: Low Risk  (05/01/2023)  Physical Activity: Sufficiently Active (05/01/2023)  Social Connections: Socially Integrated (01/09/2024)  Stress: No Stress Concern Present (05/01/2023)  Tobacco Use: Medium Risk (01/09/2024)  Health Literacy: Adequate Health Literacy (05/01/2023)   Readmission Risk Interventions    01/11/2024   10:01 AM  Readmission Risk Prevention Plan  Post Dischage Appt Complete  Medication Screening Complete  Transportation Screening Complete

## 2024-01-17 NOTE — Plan of Care (Signed)
  Problem: Clinical Measurements: Goal: Ability to maintain clinical measurements within normal limits will improve Outcome: Progressing   Problem: Clinical Measurements: Goal: Will remain free from infection Outcome: Progressing   Problem: Clinical Measurements: Goal: Diagnostic test results will improve Outcome: Progressing   Problem: Clinical Measurements: Goal: Respiratory complications will improve Outcome: Progressing   

## 2024-01-17 NOTE — Progress Notes (Signed)
 AVS given to patient's son and explained at the bedside and over the phone with the receiving nurse at [facility]. Medications and follow up appointments have been explained with pt's son and receiving nurse verbalizing understanding.

## 2024-01-18 LAB — CULTURE, OB URINE: Culture: >=100000 — AB

## 2024-01-19 NOTE — Progress Notes (Signed)
 Radiation Oncology         [phone] ________________________________  Initial outpatient Consultation - Conducted via MyChart  Name: Lindsay Mason MRN: [id number]  Date of Service: 01/20/2024 DOB: January 18, 1951  [id number], Wynona Canes, MD  Josephine Igo, DO   REFERRING PHYSICIAN: Josephine Igo, DO  DIAGNOSIS: There were no encounter diagnoses.  No diagnosis found.  HISTORY OF PRESENT ILLNESS: Lindsay Mason is a 73 y.o. female seen at the request of Dr. Delton Coombes. She has a 36-pack-year history of smoking and quit in 2016. She has been monitored with screening chest CT since 2017 and was previously found to have an enlarging right lower lobe nodule in 12/2019. She was taken for same-day bronchoscopy and RLL lobectomy on 01/20/20 under Dr. Tonia Brooms and Dr. Cliffton Asters. Pathology showed stage pT1bN0 adenocarcinoma. Her case was presented in our multidisciplinary lung cancer conference and no adjuvant treatment was recommended. She has continued to undergo surveillance chest CT scans approximately every 6 months since that time. A recent chest CT from 10/24/23 showed a somewhat amorphous but irregular ground-glass nodule in left lower lobe, stable from 12/2021 but slightly more prominent than in 12/2020. This was followed by a PET scan on 12/01/23 showing low-level nonspecific activity in the LLL ground-glass nodule. The level of activity is the same as that of her prior RLL adenocarcinoma, raising concern for recurrent cancer.  Of note, she was recently hospitalized on 01/09/24 for pneumothorax. She required chest tube placement, which was subsequently removed on 01/12/24.  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   by CT - moderate centrilobular and paraseptal   Ex-smoker  08/2014   currently using e cig   Microscopic colitis 06/2015   lymphocytic by colonoscopy - started entocort Christella Hartigan)   Osteoporosis, unspecified    Scoliosis of lumbar spine    Thoracic aortic atherosclerosis (HCC) 02/2016   by CT      PAST SURGICAL HISTORY: Past Surgical History:  Procedure Laterality Date   CATARACT EXTRACTION W/PHACO Right 02/21/2021   Procedure: CATARACT EXTRACTION PHACO AND INTRAOCULAR LENS PLACEMENT (IOC) RIGHT;  Surgeon: Lockie Mola, MD;  Location: [facility];  Service: Ophthalmology;  Laterality: Right;  cde 4.75 00:41.1 minutes 11.5%   CATARACT EXTRACTION W/PHACO Left 03/14/2021   Procedure: CATARACT EXTRACTION PHACO AND INTRAOCULAR LENS PLACEMENT (IOC) LEFT 4.07 00:44.6;  Surgeon: Lockie Mola, MD;  Location: [facility];  Service: Ophthalmology;  Laterality: Left;   COLONOSCOPY  06/2015   microscopic colitis Christella Hartigan)   DEXA  10/2008   Osteoporosis   DEXA  02/2014   T score osteopenia hip, osteoporosis spine, scoliosis   ELECTROMAGNETIC NAVIGATION BROCHOSCOPY  01/20/2020   Procedure: ELECTROMAGNETIC NAVIGATION BRONCHOSCOPY;  Surgeon: Josephine Igo, DO;  Location: [facility];  Service: Pulmonary;;   ETT  10/2011   WNL, no evidence of ischemia, excellent exercise tolerance   EXPLORATORY LAPAROTOMY     INTERCOSTAL NERVE BLOCK Right 01/20/2020   Procedure: Intercostal Nerve Block;  Surgeon: Corliss Skains, MD;  Location: [facility];  Service: Thoracic;  Laterality: Right;   MRI head  07/2011   multiple foci deep and subcortical white matter, chronic ischemic vs demyelinating, no active disease   NODE DISSECTION  01/20/2020  Procedure: Node Dissection;  Surgeon: Corliss Skains, MD;  Location: [facility];  Service: Thoracic;;   SUBMUCOSAL TATTOO INJECTION  01/20/2020   Procedure: SUBSTITIAL TATTOO INJECTION;  Surgeon: Josephine Igo, DO;  Location: [facility];  Service: Pulmonary;;   TONSILLECTOMY AND ADENOIDECTOMY     Vein  removal     removed from legs   VIDEO ASSISTED THORACOSCOPY (VATS)/WEDGE RESECTION Right 01/20/2020   XI ROBOTIC ASSISTED THORASCOPY-RIGHT LOWER LOBE WEDGE RESECTION, RIGHT LOWER LOBECTOMYRightGeneral   VIDEO BRONCHOSCOPY  01/20/2020   Procedure: VIDEO BRONCHOSCOPY WITH FLUORO;  Surgeon: Josephine Igo, DO;  Location: [facility];  Service: Pulmonary;;    FAMILY HISTORY:  Family History  Problem Relation Age of Onset   Osteoporosis Other        great grandmother   Stroke Maternal Grandmother    Coronary artery disease Maternal Grandfather 50   Diabetes Maternal Grandfather    Coronary artery disease Paternal Grandmother 69   Coronary artery disease Paternal Grandfather 63   Lung cancer Maternal Aunt     SOCIAL HISTORY:  Social History   Socioeconomic History   Marital status: Married    Spouse name: Not on file   Number of children: 3   Years of education: Not on file   Highest education level: Not on file  Occupational History   Occupation: retired  Tobacco Use   Smoking status: Former    Current packs/day: 0.00    Average packs/day: 1 pack/day for 36.7 years (36.7 ttl pk-yrs)    Types: Cigarettes    Start date: 10/14/1978    Quit date: 07/15/2015    Years since quitting: 8.5   Smokeless tobacco: Never   Tobacco comments:    off and on smoker, now e-cigarettes  Vaping Use   Vaping status: Some Days   Devices: uses occasionally - no nicotine  Substance and Sexual Activity   Alcohol use: Yes    Alcohol/week: 0.0 standard drinks of alcohol    Comment: social wine once a week   Drug use: No   Sexual activity: Not on file  Other Topics Concern   Not on file  Social History Narrative   Caffeine: 1 cup coffee/day   Lives with husband, outside dog   Smoking vapes.   Pt tans regularly - at her pool and in her garden. Wears sunscreen   Social Drivers of Corporate investment banker Strain: Low Risk  (05/01/2023)   Overall Financial Resource Strain (CARDIA)     Difficulty of Paying Living Expenses: Not hard at all  Food Insecurity: No Food Insecurity (01/09/2024)   Hunger Vital Sign    Worried About Running Out of Food in the Last Year: Never true    Ran Out of Food in the Last Year: Never true  Transportation Needs: No Transportation Needs (01/09/2024)   PRAPARE - Administrator, Civil Service (Medical): No    Lack of Transportation (Non-Medical): No  Physical Activity: Sufficiently Active (05/01/2023)   Exercise Vital Sign    Days of Exercise per Week: 7 days    Minutes of Exercise per Session: 60 min  Stress: No Stress Concern Present (05/01/2023)   [employer] of Occupational Health - Occupational Stress Questionnaire    Feeling of Stress : Not at all  Social Connections: Socially Integrated (01/09/2024)   Social Connection and Isolation Panel [NHANES]    Frequency of Communication with Friends and Family: More than three times a week    Frequency  of Social Gatherings with Friends and Family: More than three times a week    Attends Religious Services: More than 4 times per year    Active Member of [employer] or Organizations: Yes    Attends Engineer, structural: More than 4 times per year    Marital Status: Married  Catering manager Violence: Not At Risk (01/09/2024)   Humiliation, Afraid, Rape, and Kick questionnaire    Fear of Current or Ex-Partner: No    Emotionally Abused: No    Physically Abused: No    Sexually Abused: No    ALLERGIES: Penicillins  MEDICATIONS:  Current Outpatient Medications  Medication Sig Dispense Refill   albuterol (VENTOLIN HFA) 108 (90 Base) MCG/ACT inhaler Inhale 2 puffs into the lungs every 6 (six) hours as needed for wheezing or shortness of breath. 8 g 6   atorvastatin (LIPITOR) 20 MG tablet Take 1 tablet (20 mg total) by mouth daily. 30 tablet 11   b complex vitamins capsule Take 1 capsule by mouth daily.     cefadroxil (DURICEF) 500 MG capsule Take 1 capsule (500 mg total) by mouth  2 (two) times daily for 3 days.     donepezil (ARICEPT) 5 MG tablet Take 5 mg by mouth at bedtime. (Patient not taking: Reported on 01/10/2024)     famotidine (PEPCID) 20 MG tablet Take 1 tablet (20 mg total) by mouth daily as needed for heartburn or indigestion. (Patient taking differently: Take 20 mg by mouth daily.)     HYDROcodone-acetaminophen (NORCO/VICODIN) 5-325 MG tablet Take 1-2 tablets by mouth every 4 (four) hours as needed for moderate pain (pain score 4-6) or severe pain (pain score 7-10). 20 tablet 0   Multiple Vitamin (MULTIVITAMIN) tablet Take 1 tablet by mouth daily.     rivastigmine (EXELON) 9.5 mg/24hr Place 1 patch (9.5 mg total) onto the skin daily. (Patient not taking: Reported on 01/10/2024) 30 patch 3   tamsulosin (FLOMAX) 0.4 MG CAPS capsule Take 1 capsule (0.4 mg total) by mouth daily after supper.     No current facility-administered medications for this encounter.    REVIEW OF SYSTEMS:  On review of systems, the patient reports that she is doing well overall. She denies any chest pain, shortness of breath, cough, fevers, chills, night sweats, unintended weight changes. She denies any bowel or bladder disturbances, and denies abdominal pain, nausea or vomiting. She denies any new musculoskeletal or joint aches or pains.*** A complete review of systems is obtained and is otherwise negative.    PHYSICAL EXAM:  Wt Readings from Last 3 Encounters:  01/09/24 151 lb 7.3 oz (68.7 kg)  12/26/23 143 lb 6 oz (65 kg)  12/26/23 142 lb 12.8 oz (64.8 kg)   Temp Readings from Last 3 Encounters:  01/17/24 98.7 F (37.1 C)  12/26/23 98.1 F (36.7 C) (Oral)  12/05/23 98 F (36.7 C) (Oral)   BP Readings from Last 3 Encounters:  01/17/24 110/78  12/26/23 130/84  12/26/23 (!) 143/88   Pulse Readings from Last 3 Encounters:  01/17/24 91  12/26/23 73  12/26/23 74    /10  In general this is a well appearing *** woman in no acute distress. She's alert and oriented x4 and  appropriate throughout the examination. Cardiopulmonary assessment is negative for acute distress and she exhibits normal effort.     KPS = ***  100 - Normal; no complaints; no evidence of disease. 90   - Able to carry on normal activity; minor signs  or symptoms of disease. 80   - Normal activity with effort; some signs or symptoms of disease. 39   - Cares for self; unable to carry on normal activity or to do active work. 60   - Requires occasional assistance, but is able to care for most of his personal needs. 50   - Requires considerable assistance and frequent medical care. 40   - Disabled; requires special care and assistance. 30   - Severely disabled; hospital admission is indicated although death not imminent. 20   - Very sick; hospital admission necessary; active supportive treatment necessary. 10   - Moribund; fatal processes progressing rapidly. 0     - Dead  Karnofsky DA, Abelmann WH, Craver LS and Burchenal JH [phone]) The use of the nitrogen mustards in the palliative treatment of carcinoma: with particular reference to bronchogenic carcinoma Cancer 1 [id number]  LABORATORY DATA:  Lab Results  Component Value Date   WBC 9.0 01/17/2024   HGB 11.4 (L) 01/17/2024   HCT 36.6 01/17/2024   MCV 100.0 01/17/2024   PLT 295 01/17/2024   Lab Results  Component Value Date   NA 143 01/17/2024   K 3.6 01/17/2024   CL 107 01/17/2024   CO2 26 01/17/2024   Lab Results  Component Value Date   ALT 23 01/09/2024   AST 24 01/09/2024   ALKPHOS 61 01/09/2024   BILITOT 0.7 01/09/2024     RADIOGRAPHY: DG Chest Port 1 View Result Date: 01/15/2024 CLINICAL DATA:  Fever EXAM: PORTABLE CHEST 1 VIEW COMPARISON:  X-ray 01/12/2024 and older FINDINGS: Persistent left-sided chest wall gas. Trace left apical pneumothorax is further decreased today. Tiny pleural effusions. Bandlike lung base opacities. Stable enlarged cardiopericardial silhouette. Tortuous ectatic aorta. The IMPRESSION: The trace  residual left apical pneumothorax but further decreased from previous. Persistent lung base opacities. Persistent left-sided chest wall gas Electronically Signed   By: Karen Kays M.D.   On: 01/15/2024 12:20   DG CHEST PORT 1 VIEW Result Date: 01/12/2024 CLINICAL DATA:  Shortness of breath. Chest pain. Chest tube removal EXAM: PORTABLE CHEST 1 VIEW COMPARISON:  01/12/2024 earlier and older exams FINDINGS: Interval removal of the left-sided pigtail chest tube. Stable tiny left apical pneumothorax. Persistent left-sided chest wall gas. Tiny left effusion. Stable basilar opacity as well. Question tiny right effusion. No edema. Stable cardiopericardial silhouette with tortuous ectatic aorta and widened mediastinum. Overlapping cardiac leads. IMPRESSION: Interval removal of left chest tube. Tiny left apical pneumothorax remains. Chest wall gas is similar. Electronically Signed   By: Karen Kays M.D.   On: 01/12/2024 12:33   DG Chest Port 1 View Result Date: 01/12/2024 CLINICAL DATA:  Shortness of breath.  Follow-up pneumothorax. EXAM: PORTABLE CHEST 1 VIEW COMPARISON:  Chest radiograph dated 01/11/2024. FINDINGS: Similar positioning of the left chest tube. Tiny left apical pneumothorax may be present. Bibasilar atelectasis. Stable cardiomediastinal silhouette. Left chest wall soft tissue emphysema. No acute osseous pathology. IMPRESSION: Similar positioning of the left chest tube. Tiny left apical pneumothorax may be present. Electronically Signed   By: Elgie Collard M.D.   On: 01/12/2024 10:22   DG CHEST PORT 1 VIEW Result Date: 01/11/2024 CLINICAL DATA:  [id number] Chest tube in place [id number] EXAM: PORTABLE CHEST 1 VIEW COMPARISON:  January 11, 2024, January 10, 2024 FINDINGS: The cardiomediastinal silhouette is unchanged and enlarged in contour.LEFT-sided chest tube. No pleural effusion. LEFT-sided subcutaneous air. LEFT basilar platelike opacities most consistent with atelectasis. No definitive residual  pneumothorax. IMPRESSION: LEFT-sided chest  tube without definitive residual pneumothorax. Electronically Signed   By: Meda Klinefelter M.D.   On: 01/11/2024 14:19   DG CHEST PORT 1 VIEW Result Date: 01/11/2024 CLINICAL DATA:  [id number] Chest tube in place [id number] EXAM: PORTABLE CHEST 1 VIEW COMPARISON:  01/10/2024 FINDINGS: Left chest tube remains in place. No discernible pneumothorax. Mild left basilar atelectasis. Right lung remains clear. Heart size within normal limits. Left chest wall subcutaneous emphysema. IMPRESSION: Left chest tube remains in place. No discernible pneumothorax. Electronically Signed   By: Duanne Guess D.O.   On: 01/11/2024 10:12   DG CHEST PORT 1 VIEW Result Date: 01/10/2024 CLINICAL DATA:  [id number] Chest tube in place [id number] EXAM: PORTABLE CHEST 1 VIEW COMPARISON:  January 10, 2024 FINDINGS: The cardiomediastinal silhouette is unchanged in contour.LEFT-sided chest tube. LEFT-sided subcutaneous air. No pleural effusion. No significant pneumothorax. LEFT basilar platelike opacities, likely atelectasis. IMPRESSION: LEFT-sided chest tube. No significant pneumothorax. Electronically Signed   By: Meda Klinefelter M.D.   On: 01/10/2024 17:20   DG CHEST PORT 1 VIEW Result Date: 01/10/2024 CLINICAL DATA:  Chest tube in place. EXAM: PORTABLE CHEST 1 VIEW COMPARISON:  Chest x-rays from yesterday. FINDINGS: The patient is rotated to the right. Left-sided chest tube again noted. Unchanged small left apical pneumothorax. The heart size and mediastinal contours are within normal limits. Normal pulmonary vascularity. Minimal bibasilar atelectasis. No pleural effusion. Increasing subcutaneous emphysema in the left lower neck and lateral chest wall. IMPRESSION: 1. Unchanged small left apical pneumothorax with chest tube in place. 2. Increasing subcutaneous emphysema in the left lower neck and lateral chest wall. Electronically Signed   By: Obie Dredge M.D.   On: 01/10/2024 11:08   DG CHEST  PORT 1 VIEW Result Date: 01/10/2024 CLINICAL DATA:  Follow-up chest tube placement EXAM: PORTABLE CHEST 1 VIEW COMPARISON:  01/09/2024 FINDINGS: Left-sided pigtail thoracostomy tube is noted with pigtail overlying the medial left upper lobe. Unchanged small residual left apical pneumothorax which measures roughly 1 cm. Small left pleural effusion and left base atelectasis. Left chest wall subcutaneous emphysema appears increased from previous exam. Heart size and mediastinal contours are stable. IMPRESSION: 1. Stable position of left-sided chest tube. Unchanged small residual left apical pneumothorax. 2. Small left pleural effusion and left base atelectasis. 3. Increased left chest wall subcutaneous emphysema. Electronically Signed   By: Signa Kell M.D.   On: 01/10/2024 08:41   DG CHEST PORT 1 VIEW Result Date: 01/09/2024 CLINICAL DATA:  Chest tube in place. EXAM: PORTABLE CHEST 1 VIEW COMPARISON:  Same day. FINDINGS: Stable cardiomegaly. Left-sided chest tube has been advanced toward left lung apex. Small left apical pneumothorax is noted. Stable subcutaneous emphysema is seen over left lateral chest wall. Stable bibasilar subsegmental atelectasis. IMPRESSION: Left-sided chest tube has been advanced toward left lung apex. Small left apical pneumothorax is noted. Stable left-sided subcutaneous emphysema. Electronically Signed   By: Lupita Raider M.D.   On: 01/09/2024 12:48   DG Chest Portable 1 View Result Date: 01/09/2024 CLINICAL DATA:  Pneumothorax, post chest tube placement EXAM: PORTABLE CHEST - 1 VIEW COMPARISON:  Radiograph and CT from earlier the same day FINDINGS: Interval placement of pigtail chest catheter laterally at the left lung base. proximal sideholes project external to the thoracic cage, but there has been significant resolution of the pneumothorax, small residual suspected at the apex. There is a small amount of regional subcutaneous emphysema. Left infrahilar  consolidation/atelectasis. Left lung clear. Heart size and mediastinal contours are within normal limits.  Aortic Atherosclerosis (ICD10-170.0). No effusion. Visualized bones unremarkable. IMPRESSION: 1. Significant resolution of left pneumothorax following chest tube placement. The chest tube side holes are partially external to the thoracic cage. 2. Left infrahilar consolidation/atelectasis. Electronically Signed   By: Corlis Leak M.D.   On: 01/09/2024 10:35   DG Chest Portable 1 View Result Date: 01/09/2024 CLINICAL DATA:  Suspected pneumothorax on CT scan EXAM: PORTABLE CHEST 1 VIEW COMPARISON:  Earlier same day CT abdomen and pelvis, chest radiograph dated 06/02/2020 FINDINGS: Patient is rotated to the right. Near-complete atelectasis of the left lung secondary to large left pneumothorax. The heart size and mediastinal contours are within normal limits. Minimally displaced tenth rib fracture is better seen on same day CT. IMPRESSION: Large left pneumothorax with near-complete atelectasis of the left lung. Critical Value/emergent results were called by telephone at the time of interpretation on 01/09/2024 at 8:40 am to provider Estelle June, who verbally acknowledged these results. Electronically Signed   By: Agustin Cree M.D.   On: 01/09/2024 08:56   CT ABDOMEN PELVIS W CONTRAST Result Date: 01/09/2024 CLINICAL DATA:  Acute onset left-sided abdominal pain EXAM: CT ABDOMEN AND PELVIS WITH CONTRAST TECHNIQUE: Multidetector CT imaging of the abdomen and pelvis was performed using the standard protocol following bolus administration of intravenous contrast. RADIATION DOSE REDUCTION: This exam was performed according to the departmental dose-optimization program which includes automated exposure control, adjustment of the mA and/or kV according to patient size and/or use of iterative reconstruction technique. CONTRAST:  OMNIPAQUE IOHEXOL 300 MG/ML  SOLN COMPARISON:  Nuclear medicine PET dated 12/01/2023  FINDINGS: Lower chest: Near complete atelectasis of the left lung secondary to large left pneumothorax and trace pleural effusion. No substantial rightward mediastinal shift. Hepatobiliary: Subcentimeter hypodensity along peripheral segment 8 (4:23), too small to characterize but unchanged and likely a cyst. Go to adrenals nodular thickening of the left adrenal gland, previously characterized as an adenoma. No right adrenal nodule. Normal gallbladder. Pancreas: No focal lesions or main ductal dilation. Spleen: Normal in size without focal abnormality. Adrenals/Urinary Tract: No adrenal nodules. No suspicious renal mass, calculi or hydronephrosis. No focal bladder wall thickening. Stomach/Bowel: Normal appearance of the stomach. No evidence of bowel wall thickening, distention, or inflammatory changes. Colonic diverticulosis without acute diverticulitis. Normal appendix. Vascular/Lymphatic: Aortic atherosclerosis. No enlarged abdominal or pelvic lymph nodes. Reproductive: Again seen are adjacent right adnexal simple-appearing cystic foci measuring up to 3.0 cm (4:67), not substantially changed dating back to 01/03/2020. No specific follow-up imaging recommended. No left adnexal mass. Other: No free fluid, fluid collection, or free air. Musculoskeletal: Minimally displaced tenth posterior left rib fracture (2:23). Cortical undulation of the left lateral ninth through eleventh ribs. Trace subcutaneous soft tissue stranding overlying the left posterior lower chest, likely soft tissue contusion. IMPRESSION: 1. Large left pneumothorax with near complete atelectasis of the left lung. No substantial rightward mediastinal shift. 2. Minimally displaced tenth posterior left rib fracture. Cortical undulation of the left lateral ninth through eleventh ribs, which may represent nondisplaced fractures. 3. No acute abnormality in the abdomen or pelvis. 4.  Aortic Atherosclerosis (ICD10-I70.0). Critical Value/emergent results were  called by telephone at the time of interpretation on 01/09/2024 at 8:40 am to provider Estelle June, who verbally acknowledged these results. Electronically Signed   By: Agustin Cree M.D.   On: 01/09/2024 08:54      IMPRESSION/PLAN: 1. 73 y.o. woman with ***  Today, we talked to the patient and family about the findings and workup thus far.  We discussed the natural history of lung cancer and general treatment, highlighting the role of radiotherapy in the management. We discussed the available radiation techniques, and focused on the details and logistics of delivery. We reviewed the anticipated acute and late sequelae associated with radiation in this setting. The patient was encouraged to ask questions that were answered to her satisfaction.  At the end of our discussion, the patient ***    Given current concerns for patient exposure during the COVID-19 pandemic, this encounter was conducted via video-enabled WebEx visit. The patient has given verbal consent for this type of encounter. The time spent during this encounter was *** minutes. The attendants for this meeting include [name] MD, Farrel Conners, patient Lindsay Mason {and ***.} During the encounter, [name] MD and Marcello Fennel PA-C were located at [facility] Radiation Oncology Department.  Patient Lindsay Mason {and *** were} was located at home.     Marguarite Arbour, PA-C    [name], MD  [facility]  Radiation Oncology Direct Dial: [phone]  Fax: [phone] [email or web address]  Skype  [email or web address]   This document serves as a record of services personally performed by [name], MD and Marcello Fennel, PA-C. It was created on their behalf by Mickie Bail, a trained medical scribe. The creation of this record is based on the scribe's personal observations and the provider's statements to them. This document has been checked and approved by the attending provider.

## 2024-01-20 ENCOUNTER — Ambulatory Visit
Admission: RE | Admit: 2024-01-20 | Discharge: 2024-01-20 | Disposition: A | Discharge status: Home / Self Care | Source: Ambulatory Visit | Attending: Radiation Oncology | Admitting: Radiation Oncology

## 2024-01-20 ENCOUNTER — Encounter: Payer: Self-pay | Admitting: Radiation Oncology

## 2024-01-20 VITALS — Ht 62.0 in | Wt 152.0 lb

## 2024-01-20 DIAGNOSIS — Z87891 Personal history of nicotine dependence: Secondary | ICD-10-CM | POA: Diagnosis not present

## 2024-01-20 DIAGNOSIS — C3432 Malignant neoplasm of lower lobe, left bronchus or lung: Secondary | ICD-10-CM | POA: Diagnosis not present

## 2024-01-20 DIAGNOSIS — C3492 Malignant neoplasm of unspecified part of left bronchus or lung: Secondary | ICD-10-CM | POA: Insufficient documentation

## 2024-01-22 DIAGNOSIS — E785 Hyperlipidemia, unspecified: Secondary | ICD-10-CM | POA: Diagnosis not present

## 2024-01-22 DIAGNOSIS — S2242XD Multiple fractures of ribs, left side, subsequent encounter for fracture with routine healing: Secondary | ICD-10-CM | POA: Diagnosis not present

## 2024-01-22 DIAGNOSIS — J449 Chronic obstructive pulmonary disease, unspecified: Secondary | ICD-10-CM | POA: Diagnosis not present

## 2024-01-22 DIAGNOSIS — F03918 Unspecified dementia, unspecified severity, with other behavioral disturbance: Secondary | ICD-10-CM | POA: Diagnosis not present

## 2024-01-28 ENCOUNTER — Telehealth: Payer: Self-pay

## 2024-01-28 NOTE — Transitions of Care (Post Inpatient/ED Visit) (Signed)
 01/28/2024  Name: Lindsay Mason MRN: [id number] DOB: 07-Nov-1950  Today's TOC FU Call Status: Today's TOC FU Call Status:: Successful TOC FU Call Completed TOC FU Call Complete Date: 01/28/24 Patient's Name and Date of Birth confirmed.  Transition Care Management Follow-up Telephone Call Date of Discharge: 01/27/24 Discharge Facility: Other [facility]) Name of Other ([facility]) Discharge Facility: [facility] Type of Discharge: Inpatient Admission Primary Inpatient Discharge Diagnosis:: pneumothorax How have you been since you were released from the hospital?: Better Any questions or concerns?: Yes Patient Questions/Concerns:: [facility] was giving Aricept again which caused her to have headaches. Family has d/c since patient has come home  Items Reviewed: Did you receive and understand the discharge instructions provided?: Yes Medications obtained,verified, and reconciled?: Yes (Medications Reviewed) Any new allergies since your discharge?: No Dietary orders reviewed?: Yes Do you have support at home?: Yes People in Home [RPT]: spouse, child(ren), adult  Medications Reviewed Today: Medications Reviewed Today     Reviewed by Karena Addison, LPN (Licensed Practical Nurse) on 01/28/24 at 1001  Med List Status: <None>   Medication Order Taking? Sig Documenting Provider Last Dose Status Informant  albuterol (VENTOLIN HFA) 108 (90 Base) MCG/ACT inhaler [id number] No Inhale 2 puffs into the lungs every 6 (six) hours as needed for wheezing or shortness of breath. Josephine Igo, DO 01/09/2024 Active Child, Pharmacy Records  atorvastatin (LIPITOR) 20 MG tablet [id number] No Take 1 tablet (20 mg total) by mouth daily. Eustaquio Boyden, MD Taking Active Child, Pharmacy Records  b complex vitamins capsule [id number] No Take 1 capsule by mouth daily. [provider] Taking Active Child, Pharmacy Records  famotidine (PEPCID) 20 MG tablet [id number] No Take 1 tablet (20 mg  total) by mouth daily as needed for heartburn or indigestion.  Patient taking differently: Take 20 mg by mouth daily.   Eustaquio Boyden, MD Taking Active Child, Pharmacy Records  HYDROcodone-acetaminophen (NORCO/VICODIN) 5-325 MG tablet [id number] No Take 1-2 tablets by mouth every 4 (four) hours as needed for moderate pain (pain score 4-6) or severe pain (pain score 7-10). Miguel Rota, MD Taking Active   Multiple Vitamin (MULTIVITAMIN) tablet [id number] No Take 1 tablet by mouth daily. [provider] 01/08/2024 Active Child, Pharmacy Records  rivastigmine (EXELON) 9.5 mg/24hr [id number] No Place 1 patch (9.5 mg total) onto the skin daily. Eustaquio Boyden, MD Taking Active Child, Pharmacy Records  tamsulosin [facility]) 0.4 MG CAPS capsule [id number] No Take 1 capsule (0.4 mg total) by mouth daily after supper. Lanae Boast, MD Taking Active             Home Care and Equipment/Supplies: Were Home Health Services Ordered?: Yes Name of Home Health Agency:: unknown Has Agency set up a time to come to your home?: No Any new equipment or medical supplies ordered?: NA  Functional Questionnaire: Do you need assistance with bathing/showering or dressing?: Yes Do you need assistance with meal preparation?: Yes Do you need assistance with eating?: No Do you have difficulty maintaining continence: Yes Do you need assistance with getting out of bed/getting out of a chair/moving?: Yes Do you have difficulty managing or taking your medications?: Yes  Follow up appointments reviewed: PCP Follow-up appointment confirmed?: Yes Date of PCP follow-up appointment?: 02/04/24 Follow-up Provider: [facility] Follow-up appointment confirmed?: NA Do you need transportation to your follow-up appointment?: No Do you understand care options if your condition(s) worsen?: Yes-patient verbalized understanding    SIGNATURE Karena Addison, LPN [facility] Nurse Health Advisor Direct Dial  [phone]

## 2024-02-04 ENCOUNTER — Encounter: Payer: Self-pay | Admitting: Family Medicine

## 2024-02-04 ENCOUNTER — Ambulatory Visit: Admitting: Family Medicine

## 2024-02-04 VITALS — BP 124/78 | HR 82 | Temp 98.0°F | Ht 62.0 in | Wt 139.4 lb

## 2024-02-04 DIAGNOSIS — S2242XD Multiple fractures of ribs, left side, subsequent encounter for fracture with routine healing: Secondary | ICD-10-CM | POA: Diagnosis not present

## 2024-02-04 DIAGNOSIS — R911 Solitary pulmonary nodule: Secondary | ICD-10-CM

## 2024-02-04 DIAGNOSIS — R339 Retention of urine, unspecified: Secondary | ICD-10-CM

## 2024-02-04 DIAGNOSIS — W19XXXA Unspecified fall, initial encounter: Secondary | ICD-10-CM | POA: Diagnosis not present

## 2024-02-04 DIAGNOSIS — S270XXA Traumatic pneumothorax, initial encounter: Secondary | ICD-10-CM

## 2024-02-04 DIAGNOSIS — G309 Alzheimer's disease, unspecified: Secondary | ICD-10-CM

## 2024-02-04 DIAGNOSIS — F02818 Dementia in other diseases classified elsewhere, unspecified severity, with other behavioral disturbance: Secondary | ICD-10-CM

## 2024-02-04 DIAGNOSIS — R519 Headache, unspecified: Secondary | ICD-10-CM | POA: Diagnosis not present

## 2024-02-04 LAB — CBC WITH DIFFERENTIAL/PLATELET
Basophils Absolute: 0 10*3/uL (ref 0.0–0.1)
Basophils Relative: 0.5 % (ref 0.0–3.0)
Eosinophils Absolute: 0.1 10*3/uL (ref 0.0–0.7)
Eosinophils Relative: 1.1 % (ref 0.0–5.0)
HCT: 36.2 % (ref 36.0–46.0)
Hemoglobin: 11.7 g/dL — ABNORMAL LOW (ref 12.0–15.0)
Lymphocytes Relative: 15 % (ref 12.0–46.0)
Lymphs Abs: 1.2 10*3/uL (ref 0.7–4.0)
MCHC: 32.5 g/dL (ref 30.0–36.0)
MCV: 94.2 fl (ref 78.0–100.0)
Monocytes Absolute: 0.3 10*3/uL (ref 0.1–1.0)
Monocytes Relative: 4.5 % (ref 3.0–12.0)
Neutro Abs: 6.1 10*3/uL (ref 1.4–7.7)
Neutrophils Relative %: 78.9 % — ABNORMAL HIGH (ref 43.0–77.0)
Platelets: 358 10*3/uL (ref 150.0–400.0)
RBC: 3.84 Mil/uL — ABNORMAL LOW (ref 3.87–5.11)
RDW: 13.8 % (ref 11.5–15.5)
WBC: 7.8 10*3/uL (ref 4.0–10.5)

## 2024-02-04 LAB — BASIC METABOLIC PANEL WITH GFR
BUN: 14 mg/dL (ref 6–23)
CO2: 28 meq/L (ref 19–32)
Calcium: 9 mg/dL (ref 8.4–10.5)
Chloride: 106 meq/L (ref 96–112)
Creatinine, Ser: 0.68 mg/dL (ref 0.40–1.20)
GFR: 86.68 mL/min (ref >60.00)
Glucose, Bld: 95 mg/dL (ref 70–99)
Potassium: 3.8 meq/L (ref 3.5–5.1)
Sodium: 140 meq/L (ref 135–145)

## 2024-02-04 NOTE — Patient Instructions (Addendum)
 Stop donepezil  as it may be contributing to headache.  Continue Excelon patch.  Ok to stop tamsulosin  bladder medicine.  For rib fracture pain control - may continue tylenol  500mg  2 tablets up to 3 times a day. May take ibuprofen 200mg  2 tablets with meals in between tylenol .

## 2024-02-04 NOTE — Progress Notes (Unsigned)
 Ph: [phone] Fax: [phone]   Patient ID: Lindsay Mason, female    DOB: 06/20/1951, 73 y.o.   MRN: [id number]  This visit was conducted in person.  BP 124/78   Pulse 82   Temp 98 F (36.7 C) (Oral)   Ht 5\' 2"  (1.575 m)   Wt 139 lb 6 oz (63.2 kg)   SpO2 97%   BMI 25.49 kg/m    CC: hosp f/u visit  Subjective:   HPI: Lindsay Mason is a 73 y.o. female presenting on 02/04/2024 for Hospitalization Follow-up (Admitted on 01/09/24 at [facility], dx pneumothorax; ex-smoker. Pt accompanied by husband, Gwinda Leopard and s-i-l, Don. )   Recent hospitalization for chest pain/dyspnea after unwitnessed fall at home - found to have large L sided pneumothorax as well as several rib fractures s/p chest tube insertion. Chest tube removed 01/12/2024.  Hospital records reviewed. Med rec performed.  Imaging showed minimally displaced posterior 10th L rib fracture as well as possible fractures of 9th through 11th ribs.  Treated empirically for fever with cefadroxil  She did use foley catheter - and UCx grew >100k Enteroccocus faecalis and 90k staph simulans. No further abx treatment given pt remained asxs from UTI symptoms - thought bacterial colonization.   Progressive alz dementia - aricept  may have caused headache. Now on rivastigmine  patch 9.5mg  daily. Pending neurology eval 03/02/2024.  Aricept  was restarted while hospitalized - she endorses return of headaches.   Ongoing body aches, trouble pinpointing location.  Ongoing nocturnal incontinence.   Home health not set up as discharged to SNF.  Other follow up appointments scheduled: saw rad onc Dr Lorri Rota 01/20/2024 for stage 1A NSCLC LLL adenocarcinoma planned SBRT.  ______________________________________________________________________ Hospital admission: 01/09/2024 Hospital discharge: 01/17/2024 - discharged to [facility] SN[facility] SNF discharge: 01/27/2024 TCM f/u phone call:  performed on 01/28/2024  Recommendations for Outpatient Follow-up:  Follow up  with PCP in 1 weeks-call for appointment Please obtain BMP/CBC in one week  CT abd/pelvis with contrast IMPRESSION: 1. Large left pneumothorax with near complete atelectasis of the left lung. No substantial rightward mediastinal shift. 2. Minimally displaced tenth posterior left rib fracture. Cortical undulation of the left lateral ninth through eleventh ribs, which may represent nondisplaced fractures. 3. No acute abnormality in the abdomen or pelvis. 4.  Aortic Atherosclerosis (ICD10-I70.0).     Relevant past medical, surgical, family and social history reviewed and updated as indicated. Interim medical history since our last visit reviewed. Allergies and medications reviewed and updated. Outpatient Medications Prior to Visit  Medication Sig Dispense Refill   albuterol  (VENTOLIN  HFA) 108 (90 Base) MCG/ACT inhaler Inhale 2 puffs into the lungs every 6 (six) hours as needed for wheezing or shortness of breath. 8 g 6   atorvastatin  (LIPITOR) 20 MG tablet Take 1 tablet (20 mg total) by mouth daily. 30 tablet 11   b complex vitamins capsule Take 1 capsule by mouth daily.     famotidine  (PEPCID ) 20 MG tablet Take 1 tablet (20 mg total) by mouth daily as needed for heartburn or indigestion. (Patient taking differently: Take 20 mg by mouth daily.)     Multiple Vitamin (MULTIVITAMIN) tablet Take 1 tablet by mouth daily.     rivastigmine  (EXELON ) 9.5 mg/24hr Place 1 patch (9.5 mg total) onto the skin daily. 30 patch 3   HYDROcodone -acetaminophen  (NORCO/VICODIN) 5-325 MG tablet Take 1-2 tablets by mouth every 4 (four) hours as needed for moderate pain (pain score 4-6) or severe pain (pain score 7-10). 20 tablet 0  tamsulosin  (FLOMAX ) 0.4 MG CAPS capsule Take 1 capsule (0.4 mg total) by mouth daily after supper.     No facility-administered medications prior to visit.     Per HPI unless specifically indicated in ROS section below Review of Systems  Objective:  BP 124/78   Pulse 82   Temp 98 F  (36.7 C) (Oral)   Ht 5\' 2"  (1.575 m)   Wt 139 lb 6 oz (63.2 kg)   SpO2 97%   BMI 25.49 kg/m   Wt Readings from Last 3 Encounters:  02/04/24 139 lb 6 oz (63.2 kg)  01/20/24 152 lb (68.9 kg)  01/09/24 151 lb 7.3 oz (68.7 kg)      Physical Exam Vitals and nursing note reviewed.  Constitutional:      Appearance: Normal appearance. She is not ill-appearing.  HENT:     Head: Normocephalic and atraumatic.     Mouth/Throat:     Mouth: Mucous membranes are moist.     Pharynx: Oropharynx is clear. No oropharyngeal exudate or posterior oropharyngeal erythema.  Eyes:     Extraocular Movements: Extraocular movements intact.     Pupils: Pupils are equal, round, and reactive to light.  Cardiovascular:     Rate and Rhythm: Normal rate and regular rhythm.     Pulses: Normal pulses.     Heart sounds: Normal heart sounds. No murmur heard. Pulmonary:     Effort: Pulmonary effort is normal. No respiratory distress.     Breath sounds: Normal breath sounds. No wheezing, rhonchi or rales.       Comments: Tender to palpation of left lateral lower ribcage No paradoxical chest wall movement with breathing Chest:     Chest wall: Tenderness present.  Musculoskeletal:     Right lower leg: No edema.     Left lower leg: No edema.  Skin:    General: Skin is warm and dry.     Findings: No rash.  Neurological:     Mental Status: She is alert.  Psychiatric:        Mood and Affect: Mood normal.        Behavior: Behavior normal.        Lab Results  Component Value Date   NA 140 02/04/2024   CL 106 02/04/2024   K 3.8 02/04/2024   CO2 28 02/04/2024   BUN 14 02/04/2024   CREATININE 0.68 02/04/2024   GFRNONAA >60 01/17/2024   CALCIUM  9.0 02/04/2024   PHOS 3.6 01/11/2024   ALBUMIN  4.2 01/09/2024   GLUCOSE 95 02/04/2024    Lab Results  Component Value Date   WBC 7.8 02/04/2024   HGB 11.7 (L) 02/04/2024   HCT 36.2 02/04/2024   MCV 94.2 02/04/2024   PLT 358.0 02/04/2024   Assessment &  Plan:   Problem List Items Addressed This Visit     Headache   Recurrent significant HA after aricept  restart while hospitalized Advised stop this as it previously contributed to headache.       Alzheimer dementia with behavioral disturbance (HCC)   Continue Excelon patch at 9.5mg  dose.  Stop Aricept  (donepezil ).  Dementia complicates care.       Pulmonary nodule, left   Saw rad onc - discussing directed radiation therapy once she recovers from recent injuries.      Pneumothorax - Primary   Traumatic after fall with rib fractures  S/p chest tube placement, subsequent removal  Did have short stay at SNF, came home 01/27/2024.  Lungs sound clear  today       Relevant Orders   CBC with Differential/Platelet (Completed)   Basic metabolic panel with GFR (Completed)   Fall with injury   Suffered at home, unwitnessed.  Short stay at SNF for rehab after recent fall with PTX and fractures.       Multiple closed fractures of ribs of left side with routine healing   Ongoing pain from this. Discussed tylenol  use. Ok to add low dose ibuprofen between tylenol  dosing for breakthrough pain, discussed GI bleed precautions.  Discussed carrying pillow/cushion for buffering chest wall movement.       Urinary retention   With short term need for foley cath during hospitalization, removed prior to discharge. Ok to stop tamsulosin  at this time.         No orders of the defined types were placed in this encounter.   Orders Placed This Encounter  Procedures   CBC with Differential/Platelet   Basic metabolic panel with GFR    Patient Instructions  Stop donepezil  as it may be contributing to headache.  Continue Excelon patch.  Ok to stop tamsulosin  bladder medicine.  For rib fracture pain control - may continue tylenol  500mg  2 tablets up to 3 times a day. May take ibuprofen 200mg  2 tablets with meals in between tylenol .   Follow up plan: No follow-ups on file.  Claire Crick,  MD

## 2024-02-05 ENCOUNTER — Encounter: Payer: Self-pay | Admitting: Family Medicine

## 2024-02-05 DIAGNOSIS — W19XXXA Unspecified fall, initial encounter: Secondary | ICD-10-CM | POA: Insufficient documentation

## 2024-02-05 DIAGNOSIS — S2242XD Multiple fractures of ribs, left side, subsequent encounter for fracture with routine healing: Secondary | ICD-10-CM | POA: Insufficient documentation

## 2024-02-05 DIAGNOSIS — R339 Retention of urine, unspecified: Secondary | ICD-10-CM | POA: Insufficient documentation

## 2024-02-05 NOTE — Assessment & Plan Note (Signed)
 Recurrent significant HA after aricept  restart while hospitalized Advised stop this as it previously contributed to headache.

## 2024-02-05 NOTE — Assessment & Plan Note (Signed)
 Traumatic after fall with rib fractures  S/p chest tube placement, subsequent removal  Did have short stay at SNF, came home 01/27/2024.  Lungs sound clear today

## 2024-02-05 NOTE — Assessment & Plan Note (Signed)
 Ongoing pain from this. Discussed tylenol  use. Ok to add low dose ibuprofen between tylenol  dosing for breakthrough pain, discussed GI bleed precautions.  Discussed carrying pillow/cushion for buffering chest wall movement.

## 2024-02-05 NOTE — Assessment & Plan Note (Signed)
 Suffered at home, unwitnessed.  Short stay at SNF for rehab after recent fall with PTX and fractures.

## 2024-02-05 NOTE — Assessment & Plan Note (Addendum)
 With short term need for foley cath during hospitalization, removed prior to discharge. Ok to stop tamsulosin  at this time.

## 2024-02-05 NOTE — Assessment & Plan Note (Signed)
 Continue Excelon patch at 9.5mg  dose.  Stop Aricept  (donepezil ).  Dementia complicates care.

## 2024-02-05 NOTE — Assessment & Plan Note (Signed)
 Saw rad onc - discussing directed radiation therapy once she recovers from recent injuries.

## 2024-02-17 ENCOUNTER — Telehealth: Payer: Self-pay

## 2024-02-17 NOTE — Telephone Encounter (Signed)
 Noted.

## 2024-02-17 NOTE — Telephone Encounter (Signed)
 Agree with this. Thanks.

## 2024-02-17 NOTE — Telephone Encounter (Signed)
 VO given for OT Eval

## 2024-02-17 NOTE — Telephone Encounter (Signed)
 Copied from CRM [id number]. Topic: Clinical - Home Health Verbal Orders >> Feb 17, 2024  8:12 AM Rosamond Comes wrote: Caller/Agency: Lorenda Rom Occupational Therapist [facility]  Callback Number: [phone]  Service Requested: Occupational Therapy Frequency: Evaluation Only Any new concerns about the patient? No

## 2024-02-24 ENCOUNTER — Telehealth: Payer: Self-pay

## 2024-02-24 NOTE — Telephone Encounter (Signed)
 RN and radiation therapists have made several attempts to reach Lindsay Mason.  RN did leave voicemail for Lindsay Mason to return call as soon as possible To set up appointment for CT simulation.

## 2024-02-25 DIAGNOSIS — I739 Peripheral vascular disease, unspecified: Secondary | ICD-10-CM | POA: Diagnosis not present

## 2024-02-25 DIAGNOSIS — M6281 Muscle weakness (generalized): Secondary | ICD-10-CM | POA: Diagnosis not present

## 2024-02-25 DIAGNOSIS — F02818 Dementia in other diseases classified elsewhere, unspecified severity, with other behavioral disturbance: Secondary | ICD-10-CM | POA: Diagnosis not present

## 2024-02-25 DIAGNOSIS — Z87440 Personal history of urinary (tract) infections: Secondary | ICD-10-CM | POA: Diagnosis not present

## 2024-02-25 DIAGNOSIS — K219 Gastro-esophageal reflux disease without esophagitis: Secondary | ICD-10-CM | POA: Diagnosis not present

## 2024-02-25 DIAGNOSIS — J439 Emphysema, unspecified: Secondary | ICD-10-CM | POA: Diagnosis not present

## 2024-02-25 DIAGNOSIS — F05 Delirium due to known physiological condition: Secondary | ICD-10-CM | POA: Diagnosis not present

## 2024-02-25 DIAGNOSIS — M419 Scoliosis, unspecified: Secondary | ICD-10-CM | POA: Diagnosis not present

## 2024-02-25 DIAGNOSIS — S2242XD Multiple fractures of ribs, left side, subsequent encounter for fracture with routine healing: Secondary | ICD-10-CM | POA: Diagnosis not present

## 2024-02-25 DIAGNOSIS — G309 Alzheimer's disease, unspecified: Secondary | ICD-10-CM | POA: Diagnosis not present

## 2024-02-25 DIAGNOSIS — Z87891 Personal history of nicotine dependence: Secondary | ICD-10-CM | POA: Diagnosis not present

## 2024-02-25 DIAGNOSIS — M810 Age-related osteoporosis without current pathological fracture: Secondary | ICD-10-CM | POA: Diagnosis not present

## 2024-03-02 ENCOUNTER — Encounter: Payer: Self-pay | Admitting: Neurology

## 2024-03-02 ENCOUNTER — Ambulatory Visit: Payer: Medicare HMO | Admitting: Neurology

## 2024-03-02 VITALS — BP 116/79 | HR 89 | Ht 62.0 in | Wt 141.0 lb

## 2024-03-02 DIAGNOSIS — I25119 Atherosclerotic heart disease of native coronary artery with unspecified angina pectoris: Secondary | ICD-10-CM

## 2024-03-02 DIAGNOSIS — Z85118 Personal history of other malignant neoplasm of bronchus and lung: Secondary | ICD-10-CM | POA: Diagnosis not present

## 2024-03-02 DIAGNOSIS — R9082 White matter disease, unspecified: Secondary | ICD-10-CM

## 2024-03-02 DIAGNOSIS — F03C0 Unspecified dementia, severe, without behavioral disturbance, psychotic disturbance, mood disturbance, and anxiety: Secondary | ICD-10-CM | POA: Insufficient documentation

## 2024-03-02 DIAGNOSIS — I998 Other disorder of circulatory system: Secondary | ICD-10-CM | POA: Diagnosis not present

## 2024-03-02 DIAGNOSIS — G9341 Metabolic encephalopathy: Secondary | ICD-10-CM | POA: Insufficient documentation

## 2024-03-02 DIAGNOSIS — I739 Peripheral vascular disease, unspecified: Secondary | ICD-10-CM

## 2024-03-02 DIAGNOSIS — R410 Disorientation, unspecified: Secondary | ICD-10-CM | POA: Diagnosis not present

## 2024-03-02 DIAGNOSIS — K5700 Diverticulitis of small intestine with perforation and abscess without bleeding: Secondary | ICD-10-CM

## 2024-03-02 DIAGNOSIS — F03C18 Unspecified dementia, severe, with other behavioral disturbance: Secondary | ICD-10-CM | POA: Diagnosis not present

## 2024-03-02 MED ORDER — NURTEC 75 MG PO TBDP: 75.0000 mg | ORAL_TABLET | ORAL | 0 refills | Status: DC | PRN

## 2024-03-02 NOTE — Patient Instructions (Addendum)
 Dementia Caregiver Guide Dementia is a condition that affects the way the brain works. It often affects thinking and memory. A person with dementia may: Forget things. Have trouble talking or responding to your questions. Have trouble paying attention. Have trouble thinking clearly and making good decisions. Get lost or wander away from home or other places. Have big changes in their mood or emotions. They may: Feel very worried, nervous, or depressed. Have angry outbursts. Be suspicious or accuse you of things. Have childlike behavior and language. Taking care of someone with dementia can be a challenge. The tips below can help you care for the person. How to help manage lifestyle changes Dementia usually gets worse slowly over time. In the early stages, people with dementia can stay safe and take care of themselves with some help. In later stages, they need help with daily tasks like getting dressed, grooming, and going to the bathroom. Communicating When the person is talking and seems frustrated, make eye contact and hold the person's hand. Ask questions that can be answered with a yes or no. Use simple words and a calm voice. Only give one direction at a time. Limit choices for the person. Too many choices can be stressful. Avoid correcting the person in a negative way. If the person can't find the right words, gently try to help. Preventing injury  Keep floors clear. Remove rugs, magazine racks, and floor lamps. Keep hallways well lit, especially at night. Put a handrail and nonslip mat in the bathtub or shower. Put childproof locks on cabinets that have dangerous items in them. These items include medicine, alcohol, guns, cleaning products, and sharp tools. For doors to the outside, put locks where the person can't see or reach them. This helps keep the person from going out of the house and getting lost. Be ready for emergencies. Keep a list of emergency phone numbers and  addresses close by. Remove car keys and lock garage doors so the person doesn't try to drive. Have the person wear a bracelet that tracks where they are and shows that they're a person with memory loss. This should be worn at all times for safety. Helping with daily life  Keep the person on track with their daily routine. Try to identify areas where the person may need help. Be supportive, patient, calm, and encouraging. Gently remind the person that adjusting to changes takes time. Help with the tasks that the person has asked for help with. Keep the person involved in daily tasks and decisions as much as you can. Encourage conversation, but try not to get frustrated if the person struggles to find words or doesn't seem to appreciate your help. Other tips Think about any safety risks and take steps to avoid them. Keep things organized: Organize medicines in a pill box for each day of the week. Keep a calendar in a central place. Use it to remind the person of health care visits or other activities. Create a plan to handle any legal or financial matters. Get help from a professional if needed. Help make sure the person: Takes medicines only as told by their health care providers. Eats regular, healthy meals. They should also drink plenty of fluids. Goes to all scheduled health care appointments. Gets regular sleep. Taking care of yourself Being a caregiver for someone with dementia can be hard. You may feel stressed and have many other emotions. It's important to also take care of yourself. Here are some tips: Find out about  services that can provide short-term care for the person. This is called respite care. It can allow you to take a break when you need one. Find healthy ways to deal with stress. Some ways include: Spending time with other people. Exercising. Meditating or doing deep breathing exercises. Take care of your own health by: Getting enough sleep. Eating healthy  foods. Getting regular exercise. Join a support group with others who are caregivers. These groups can help you: Learn other ways to deal with stress. Share experiences with others. Get emotional comfort and support. Learn about caregiving as the disease gets worse. Find resources in your community. Where to find support: Many people and organizations offer support. These include: Support groups for people with dementia. Support groups for caregivers. Counselors or therapists. Home health care services. Adult day care centers. Where to find more information Alzheimer's Association: [employer] Family Caregiver Alliance: caregiver.org Alzheimer's Foundation of [place]: alzfdn.org Contact a health care provider if: The person's health is quickly getting worse. You're no longer able to care for the person. Caring for the person is affecting your physical and emotional health. You're feeling worried, nervous, or depressed about caring for the person. Get help right away if: You feel like the person may hurt themselves or others. The person has talked about taking their own life. These symptoms may be an emergency. Take one of these steps right away: Go to your nearest emergency room. Call 911. Call the National Suicide Prevention Lifeline at (587) 145-7835 or 988. Text the Crisis Text Line at (639) 019-3386. This information is not intended to replace advice given to you by your health care provider. Make sure you discuss any questions you have with your health care provider. Document Revised: 01/10/2023 Document Reviewed: 01/10/2023 Elsevier Patient Education  2024 [employer].ASSESSMENT AND PLAN 73 y.o. year old female  here with:     1) Advanced stages of dementia - and  present for several years. Progressive  worsening after lung cancer surgery , onset almost 2 decades ago.  Advanced to the level where she is not safe alone or a unobserved.  Visual apraxia, Let right differentiation  impaired, can follow only single step commands.    2)  Recently had traumatic pneumothorax, fractured rib pierced the lung-in  4 -2025.   3) dx of alzheimer's dementia was never verified, at this stage there is no  therapeutic consequence to the differential dx.    History of migraine and in her mother too. She was a long time smoker.  Lung cancer now recurrence -  opposite lung by PET scan.    I have offered an ATN , dementia panel , and will not need a repeat of imaging studies.

## 2024-03-02 NOTE — Addendum Note (Signed)
 Addended by: Neomia Banner on: 03/02/2024 03:29 PM   Modules accepted: Orders

## 2024-03-02 NOTE — Progress Notes (Addendum)
 SLEEP MED[facility]INE [facility]IC    Provider:  Dedra Gores, MD  Primary Care Physician:  [name], MD [address] [place] [address]     Referring Provider: [name], Md [address] [place],  [place] [address]          Chief Complaint according to patient   Patient presents with:     New Patient (Initial Visit)           HISTORY OF PRESENT ILLNESS:  Lindsay Mason is a 73 y.o. female patient who is seen upon referral from Dr Rilla on 03/02/2024 for a memory disorder, seen here with husband and son.   The patient reportedly hasn't noticed any changes with her memory and behavior. Family does report a change / decline in her cognition - about 20 yrs ago. In the last 5 yrs they have noticed a big change. She started to slowly loose her ability to do things:  spelling, ADLs, house keeping  skills, as well as her decline in eye sight.  Her spouse will find her wondering the house at night.    Chief concern according to patient :  unaware herself.  Long term memory fairly unaffected.      Relevant medical history: memory loss treated on donepezil - last September  she had nausea and switched to patch in April.  Headaches daily, cannot read as well, eyesight, and she had beautiful handwriting which has deteriorated.  PAD disease.  No DM, no cholesterol problems, had HTN, no thyroid  disease. Varicose veins.    Family medical history: No other family member with memory loss, father died in 07-25-20 of Covid, was 69 years -old.  Mother died of unknown causes, had meningitis and brain damage .  ADL : no longer independent , not able to drive for 8 plus years, no longer cooking,  doing laundry etc for the same length of time.  Family noted a change in early 26-Jul-2003 - upon her retirement. And in July 25, 2020 she developed lung cancer and had lobectomy. She has severe mental status changes in the hospital and was agitated .  Since then progression is steady, and the  family was told she was acting age appropriate by some of her medical providers.        Social history:  Patient is a native from [place], [place]  here, graduated HS , took classes. ,  retired from [employer] , later at a Chief Strategy Officer.  she lives in a household with husband  one dog- , adult children.  Tobacco use; quit. 1/2 ppd for 20- 2025 years,   ETOH use : none   Caffeine intake in form of Coffee( 1 cup  in am ) Soda( /) Tea ( /) no  energy drinks Exercise in form of daily walking, running. .   Hobbies : running.          Review of Systems: Out of a complete 14 system review, the patient complains of only the following symptoms, and all other reviewed systems are negative.:    ADL : Mobility is independent.    reviewed the brain MRIs with the family: extensive white matter microvascular disease and parietal temporal atrophy ( moderate ). 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 No hydrocephalus or extra-axial collection. No mass or abnormal susceptibility. Generalized volume loss with slight temporoparietal predominance and bilateral hippocampal atrophy.  Social History   Socioeconomic History   Marital  status: Married    Spouse name: Not on file   Number of children: 3   Years of education: Not on file   Highest education level: Not on file  Occupational History   Occupation: retired  Tobacco Use   Smoking status: Former    Current packs/day: 0.00    Average packs/day: 1 pack/day for 36.7 years (36.7 ttl pk-yrs)    Types: Cigarettes    Start date: 10/14/1978    Quit date: 07/15/2015    Years since quitting: 8.6   Smokeless tobacco: Never   Tobacco comments:    off and on smoker, now e-cigarettes  Vaping Use   Vaping status: Some Days   Devices: uses occasionally - no nicotine  Substance and Sexual Activity   Alcohol use: Not Currently   Drug use: No   Sexual activity: Not on file  Other Topics  Concern   Not on file  Social History Narrative   Caffeine: 1 cup coffee/day   Lives with husband, outside dog   Smoking vapes.   Pt tans regularly - at her pool and in her garden. Wears sunscreen   Social Drivers of Corporate investment banker Strain: Low Risk  (05/01/2023)   Overall Financial Resource Strain (CARDIA)    Difficulty of Paying Living Expenses: Not hard at all  Food Insecurity: No Food Insecurity (01/20/2024)   Hunger Vital Sign    Worried About Running Out of Food in the Last Year: Never true    Ran Out of Food in the Last Year: Never true  Transportation Needs: No Transportation Needs (01/20/2024)   PRAPARE - Administrator, Civil Service (Medical): No    Lack of Transportation (Non-Medical): No  Physical Activity: Sufficiently Active (05/01/2023)   Exercise Vital Sign    Days of Exercise per Week: 7 days    Minutes of Exercise per Session: 60 min  Stress: No Stress Concern Present (05/01/2023)   [employer] of Occupational Health - Occupational Stress Questionnaire    Feeling of Stress : Not at all  Social Connections: Socially Integrated (01/09/2024)   Social Connection and Isolation Panel [NHANES]    Frequency of Communication with Friends and Family: More than three times a week    Frequency of Social Gatherings with Friends and Family: More than three times a week    Attends Religious Services: More than 4 times per year    Active Member of Clubs or Organizations: Yes    Attends Engineer, structural: More than 4 times per year    Marital Status: Married    Family History  Problem Relation Age of Onset   Osteoporosis Other        great grandmother   Stroke Maternal Grandmother    Coronary artery disease Maternal Grandfather 50   Diabetes Maternal Grandfather    Coronary artery disease Paternal Grandmother 20   Coronary artery disease Paternal Grandfather 71   Lung cancer Maternal Aunt     Past Medical History:  Diagnosis Date    Adrenal hyperplasia (HCC) 12/2011   on CT   Allergy    CAP (community acquired pneumonia) 12/2011   Chest pain    normal ETT 10/2011   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   by CT - moderate centrilobular and paraseptal   Ex-smoker 08/2014   currently using e cig   Microscopic colitis 06/2015   lymphocytic by colonoscopy - started entocort [name])   Osteoporosis, unspecified    Scoliosis  of lumbar spine    Thoracic aortic atherosclerosis (HCC) 02/2016   by CT    Past Surgical History:  Procedure Laterality Date   CATARACT EXTRACTION W/PHACO Right 02/21/2021   Procedure: CATARACT EXTRACTION PHACO AND INTRAOCULAR LENS PLACEMENT (IOC) RIGHT;  Surgeon: Mittie Gaskin, MD;  Location: [facility];  Service: Ophthalmology;  Laterality: Right;  cde 4.75 00:41.1 minutes 11.5%   CATARACT EXTRACTION W/PHACO Left 03/14/2021   Procedure: CATARACT EXTRACTION PHACO AND INTRAOCULAR LENS PLACEMENT (IOC) LEFT 4.07 00:44.6;  Surgeon: Mittie Gaskin, MD;  Location: [facility];  Service: Ophthalmology;  Laterality: Left;   COLONOSCOPY  06/2015   microscopic colitis [name])   DEXA  10/2008   Osteoporosis   DEXA  02/2014   T score osteopenia hip, osteoporosis spine, scoliosis   ELECTROMAGNETIC NAVIGATION BROCHOSCOPY  01/20/2020   Procedure: ELECTROMAGNETIC NAVIGATION BRONCHOSCOPY;  Surgeon: Brenna Adine CROME, DO;  Location: [facility];  Service: Pulmonary;;   ETT  10/2011   WNL, no evidence of ischemia, excellent exercise tolerance   EXPLORATORY LAPAROTOMY     INTERCOSTAL NERVE BLOCK Right 01/20/2020   Procedure: Intercostal Nerve Block;  Surgeon: Shyrl Linnie KIDD, MD;  Location: [facility] OR;  Service: Thoracic;  Laterality: Right;   MRI head  07/2011   multiple foci deep and subcortical white matter, chronic ischemic vs demyelinating, no active disease   NODE DISSECTION  01/20/2020   Procedure: Node Dissection;  Surgeon: Shyrl Linnie KIDD, MD;  Location: [facility] OR;  Service: Thoracic;;    SUBMUCOSAL TATTOO INJECTION  01/20/2020   Procedure: SUBSTITIAL TATTOO INJECTION;  Surgeon: Brenna Adine CROME, DO;  Location: [facility];  Service: Pulmonary;;   TONSILLECTOMY AND ADENOIDECTOMY     Vein removal     removed from legs   VIDEO ASSISTED THORACOSCOPY (VATS)/WEDGE RESECTION Right 01/20/2020   XI ROBOTIC ASSISTED THORASCOPY-RIGHT LOWER LOBE WEDGE RESECTION, RIGHT LOWER LOBECTOMYRightGeneral   VIDEO BRONCHOSCOPY  01/20/2020   Procedure: VIDEO BRONCHOSCOPY WITH FLUORO;  Surgeon: Brenna Adine CROME, DO;  Location: [facility];  Service: Pulmonary;;     Current Outpatient Medications on File Prior to Visit  Medication Sig Dispense Refill   albuterol  (VENTOLIN  HFA) 108 (90 Base) MCG/ACT inhaler Inhale 2 puffs into the lungs every 6 (six) hours as needed for wheezing or shortness of breath. 8 g 6   atorvastatin  (LIPITOR) 20 MG tablet Take 1 tablet (20 mg total) by mouth daily. 30 tablet 11   b complex vitamins capsule Take 1 capsule by mouth daily.     famotidine  (PEPCID ) 20 MG tablet Take 1 tablet (20 mg total) by mouth daily as needed for heartburn or indigestion. (Patient taking differently: Take 20 mg by mouth daily.)     Multiple Vitamin (MULTIVITAMIN) tablet Take 1 tablet by mouth daily.     rivastigmine  (EXELON ) 9.5 mg/24hr Place 1 patch (9.5 mg total) onto the skin daily. 30 patch 3   No current facility-administered medications on file prior to visit.    Allergies  Allergen Reactions   Penicillins Swelling    REACTION: swelling tongue - States has taken Cephalosporins without difficulty in past     DIAGNOSTIC DATA (LABS, IMAGING, TESTING) - I reviewed patient records, labs, notes, testing and imaging myself where available.  Lab Results  Component Value Date   WBC 7.8 02/04/2024   HGB 11.7 (L) 02/04/2024   HCT 36.2 02/04/2024   MCV 94.2 02/04/2024   PLT 358.0 02/04/2024      Component Value Date/Time   NA  140 02/04/2024 1223   K 3.8 02/04/2024 1223   CL 106  02/04/2024 1223   CO2 28 02/04/2024 1223   GLUCOSE 95 02/04/2024 1223   BUN 14 02/04/2024 1223   CREATININE 0.68 02/04/2024 1223   CALCIUM  9.0 02/04/2024 1223   PROT 6.7 01/09/2024 0626   ALBUMIN  4.2 01/09/2024 0626   AST 24 01/09/2024 0626   ALT 23 01/09/2024 0626   ALKPHOS 61 01/09/2024 0626   BILITOT 0.7 01/09/2024 0626   GFRNONAA >60 01/17/2024 0357   GFRAA >60 01/22/2020 0226   Lab Results  Component Value Date   CHOL 191 12/05/2023   HDL 56.40 12/05/2023   LDLCALC 114 (H) 12/05/2023   TRIG 105.0 12/05/2023   CHOLHDL 3 12/05/2023   No results found for: HGBA1C Lab Results  Component Value Date   VITAMINB12 359 12/05/2023   Lab Results  Component Value Date   TSH 1.26 12/05/2023    PHYSICAL EXAM:  Today's Vitals   03/02/24 1418  BP: 116/79  Pulse: 89  Weight: 141 lb (64 kg)  Height: 5' 2 (1.575 m)   Body mass index is 25.79 kg/m.   Wt Readings from Last 3 Encounters:  03/02/24 141 lb (64 kg)  02/04/24 139 lb 6 oz (63.2 kg)  01/20/24 152 lb (68.9 kg)     Ht Readings from Last 3 Encounters:  03/02/24 5' 2 (1.575 m)  02/04/24 5' 2 (1.575 m)  01/20/24 5' 2 (1.575 m)      General: The patient is awake, alert and appears not in acute distress.  The patient is well groomed. Head: Normocephalic, atraumatic.  Neck is supple.  Dental status: biological  Cardiovascular:  Regular rate and cardiac rhythm by pulse,  without distended neck veins. Respiratory: Lungs are clear to auscultation.  Skin:  Without evidence of ankle edema, or rash. Trunk: The patient's posture is erect.   NEUROLOGIC EXAM: The patient is awake and alert, oriented to place and time.   Memory subjective described as intact. Family repots severe dependence in all ADL except eating and mobility-   Attention span & concentration ability appears severely impaired .Speech is repetitive, without  dysarthria, dysphonia .  Mood and affect are anxious    Cranial nerves: no loss of  smell or taste reported :  Pupils are equal and briskly reactive to light. Funduscopic exam deferred. Had lasic surgery.  Extraocular movements in vertical and horizontal planes were intact and without nystagmus. No Diplopia. Visual fields by finger perimetry was hard to test  Hearing was intact to soft voice Facial sensation intact to fine touch. Facial motor strength is symmetric and tongue and uvula move midline.  Neck ROM : rotation, tilt and flexion extension were normal for age and shoulder shrug was symmetrical.    Motor exam:  Symmetric bulk, tone and ROM.  Jittery.   Elevated tone without cog- wheeling, symmetric grip strength .   Sensory:  Fine touch and vibration were normal.     Coordination: unable to differentiate right and left, Rapid alternating movements in the fingers/hands were not performed  The Finger-to-nose maneuver was:  unable to perform ;    Gait and station:  Patient could rise unassisted from a seated position, walked without assistive device.  Stance is of normal width/ base and the patient turned with 3 steps.  Toe and heel walk were deferred.  Deep tendon reflexes: in the upper and lower extremities are symmetric and intact.  Babinski response was normal .  ASSESSMENT AND PLAN 73 y.o. year old female  here with:     Addendum -07-14-2024.  1) Advanced stages of dementia - and  present for several years. A  dx of Alzheimer's Dementia was never verified, at this stage there is no therapeutic consequence to the differential dx.  Progressive  worsening after lung cancer surgery , with escalation of a process that started almost 2 decades ago.  ADL ;  Advanced to the level where she is not safe alone or a unobserved.   2) Visual apraxia, Left  and right differentiation impaired, can follow only single step commands.   3)  Recently had traumatic pneumothorax, fractured rib pierced the lung- last month/  4 -2025.  4 )History of migraine and in her  mother too. She was a long time smoker.  Lung cancer now recurrence -  opposite lung by PET scan.  She reports nausea and ongoing headaches,  and gave the patient 2 samples of NURTEC.   I have offered an ATN and , dementia panel and will not need a repeat of imaging studies. Patient has vascular headaches but also significant cardio and pulmonary disease, and I provided samples of Nurtec to avoid vasoconstriction.  She has documented peripheral artery disease, and I addended the dx of perforated gut ( jejunum ) with diverticulitis 06-09-2024.  She has documented aortic calcification disease.   I will not need to follow up after ATN-  the goal was to give the differential Dx ( per family wishes ) but there is no therapeutic consequence at this stage of cognitive dysfunction, restrictions of ADL and decline in multiple domains.   Overall reduced life expectancy  All newer treatments  for AD are for patients in earlier stages of neuro-cognitive disorders/  memory loss.   Return to primary care for follow up.I would like to thank [name], MD for allowing me to meet with and to take care of this pleasant patient.  After spending a total time of  55  minutes face to face and additional time for physical and neurologic examination, review of laboratory studies,  personal review of imaging studies, reports and results of other testing and review of referral information / records as far as provided in visit,   Electronically signed by: Dedra Gores, MD 03/02/2024 2:41 PM  [facility] Sleep Board certified by The [employer] of Sleep Medicine and Diplomate of the [employer] of Sleep Medicine. Board certified In Neurology through the ABPN, Fellow of the [employer] of Neurology.

## 2024-03-04 ENCOUNTER — Ambulatory Visit: Payer: Self-pay | Admitting: Neurology

## 2024-03-05 ENCOUNTER — Telehealth: Payer: Self-pay

## 2024-03-05 ENCOUNTER — Other Ambulatory Visit (HOSPITAL_COMMUNITY): Payer: Self-pay

## 2024-03-05 LAB — PROTEIN ELECTROPHORESIS, SERUM
A/G Ratio: 1.2 (ref 0.7–1.7)
Albumin ELP: 3.3 g/dL (ref 2.9–4.4)
Alpha 1: 0.4 g/dL (ref 0.0–0.4)
Alpha 2: 0.8 g/dL (ref 0.4–1.0)
Beta: 0.9 g/dL (ref 0.7–1.3)
Gamma Globulin: 0.6 g/dL (ref 0.4–1.8)
Globulin, Total: 2.8 g/dL (ref 2.2–3.9)

## 2024-03-05 LAB — COMPREHENSIVE METABOLIC PANEL WITH GFR
ALT: 39 IU/L — ABNORMAL HIGH (ref 0–32)
AST: 21 IU/L (ref 0–40)
Albumin: 3.9 g/dL (ref 3.8–4.8)
Alkaline Phosphatase: 184 IU/L — ABNORMAL HIGH (ref 44–121)
BUN/Creatinine Ratio: 23 (ref 12–28)
BUN: 20 mg/dL (ref 8–27)
Bilirubin Total: 0.2 mg/dL (ref 0.0–1.2)
CO2: 26 mmol/L (ref 20–29)
Calcium: 9.1 mg/dL (ref 8.7–10.3)
Chloride: 105 mmol/L (ref 96–106)
Creatinine, Ser: 0.87 mg/dL (ref 0.57–1.00)
Globulin, Total: 2.2 g/dL (ref 1.5–4.5)
Glucose: 85 mg/dL (ref 70–99)
Potassium: 4.5 mmol/L (ref 3.5–5.2)
Sodium: 144 mmol/L (ref 134–144)
Total Protein: 6.1 g/dL (ref 6.0–8.5)
eGFR: 70 mL/min/{1.73_m2} (ref >59)

## 2024-03-05 LAB — ATN PROFILE
A -- Beta-amyloid 42/40 Ratio: 0.099 — ABNORMAL LOW (ref >0.102)
Beta-amyloid 40: 243.69 pg/mL
Beta-amyloid 42: 24.08 pg/mL
N -- NfL, Plasma: 8.02 pg/mL — ABNORMAL HIGH (ref 0.00–6.04)
T -- p-tau181: 1.71 pg/mL — ABNORMAL HIGH (ref 0.00–0.97)

## 2024-03-05 LAB — CBC WITH DIFFERENTIAL/PLATELET
Basophils Absolute: 0.1 10*3/uL (ref 0.0–0.2)
Basos: 1 %
EOS (ABSOLUTE): 0.3 10*3/uL (ref 0.0–0.4)
Eos: 3 %
Hematocrit: 34.5 % (ref 34.0–46.6)
Hemoglobin: 11.5 g/dL (ref 11.1–15.9)
Immature Grans (Abs): 0 10*3/uL (ref 0.0–0.1)
Immature Granulocytes: 0 %
Lymphocytes Absolute: 1.9 10*3/uL (ref 0.7–3.1)
Lymphs: 21 %
MCH: 31 pg (ref 26.6–33.0)
MCHC: 33.3 g/dL (ref 31.5–35.7)
MCV: 93 fL (ref 79–97)
Monocytes Absolute: 0.6 10*3/uL (ref 0.1–0.9)
Monocytes: 6 %
Neutrophils Absolute: 6 10*3/uL (ref 1.4–7.0)
Neutrophils: 69 %
Platelets: 338 10*3/uL (ref 150–450)
RBC: 3.71 x10E6/uL — ABNORMAL LOW (ref 3.77–5.28)
RDW: 12.3 % (ref 11.7–15.4)
WBC: 8.8 10*3/uL (ref 3.4–10.8)

## 2024-03-05 LAB — SEDIMENTATION RATE: Sed Rate: 17 mm/h (ref 0–40)

## 2024-03-05 LAB — VITAMIN B12: Vitamin B-12: 542 pg/mL (ref 232–1245)

## 2024-03-05 LAB — TSH+FREE T4
Free T4: 0.99 ng/dL (ref 0.82–1.77)
TSH: 2.1 u[IU]/mL (ref 0.450–4.500)

## 2024-03-05 LAB — ANA W/REFLEX: Anti Nuclear Antibody (ANA): NEGATIVE

## 2024-03-05 NOTE — Telephone Encounter (Signed)
 Received a request via CMM to complete PA for Nurtec, Insurance requires PT to have tried and failed one Triptan, or if they have a contraindication please provide details.

## 2024-03-09 NOTE — Telephone Encounter (Signed)
 Dr Dohmeier provided the pt with samples at the visit. At this time I would recommend holding off on completing the PA since the initial visit was not focused on migraine management.

## 2024-03-11 NOTE — Telephone Encounter (Signed)
 Called the daughter to review the results. There was no answer. LVM advising the patient's daughter was calling to review labs results. Will send a mychart message as well. Instructed to reply there or call back.

## 2024-03-12 ENCOUNTER — Encounter: Payer: Self-pay | Admitting: Family Medicine

## 2024-03-19 ENCOUNTER — Encounter: Payer: Self-pay | Admitting: Pharmacist

## 2024-03-19 ENCOUNTER — Telehealth: Payer: Self-pay

## 2024-03-19 NOTE — Progress Notes (Signed)
 Pharmacy Quality Measure Review  This patient is appearing on a report for being at risk of failing the adherence measure for cholesterol (statin) medications this calendar year.   Medication: ATORVASTATIN  20 MG Last fill date: 01/02/2024 for 30 day supply  Insurance report was not up to date. No action needed at this time.  Medication has been refilled as of 02/29/2024 x30 day supply.  No refills between March and May.

## 2024-03-19 NOTE — Telephone Encounter (Signed)
 RN attempt to reach patient daughter Ethyl Hering about scheduling CT simulation appointment left voicemail to return call.

## 2024-03-26 ENCOUNTER — Telehealth: Payer: Self-pay | Admitting: Family Medicine

## 2024-03-26 MED ORDER — TRAZODONE HCL 50 MG PO TABS: 25.0000 mg | ORAL_TABLET | Freq: Every evening | ORAL | 1 refills | Status: DC | PRN

## 2024-03-26 NOTE — Telephone Encounter (Signed)
 Seen at husband's appt with daughter Idamae Maize. Requesting something for sleep. Not sleeping during the day, not sleeping well at night.  Will start trazodone  25-50mg  at night. Update with effect.

## 2024-03-29 ENCOUNTER — Telehealth: Payer: Self-pay

## 2024-03-29 ENCOUNTER — Encounter: Payer: Self-pay | Admitting: Pharmacist

## 2024-03-29 NOTE — Telephone Encounter (Signed)
 RN just spoke with Mr. Sania Noy, I explained that we have been trying to get Mrs. Winsor scheduled for CT simulation he referred me to call his daughter Ethyl Hering again since she is handling their care.  He said if we don't get her then to call him back.  RN was able to reach [facility] she expressed that they just had family meeting to discuss future treatments for her mother.  They want to pause for now Ethyl Hering mentioned her brother wants to wait 4-6 months then do repeat scan.  Ethyl Hering says Mrs. Minchew's Dementia is getting worse and really not sure if it's good to continue treatment, but will be speaking with pulmonologist about her mother's condition.  Ethyl Hering will call us  back once they have a definitive answer on whether to continue treatment.  RN informed team about decision to pause treatment at this time and that Ethyl Hering (daughter) will call with final decision.

## 2024-03-29 NOTE — Progress Notes (Signed)
 Pharmacy Quality Measure Review  This patient is appearing on a report for being at risk of failing the adherence measure for cholesterol (statin) medications this calendar year.   Medication: Atorvastatin  20 mg Last fill date: 02/25/24 for 30 day supply  Contacted pharmacy to facilitate refills.

## 2024-04-05 ENCOUNTER — Ambulatory Visit: Admitting: Podiatry

## 2024-04-05 ENCOUNTER — Encounter: Payer: Self-pay | Admitting: Podiatry

## 2024-04-05 DIAGNOSIS — B351 Tinea unguium: Secondary | ICD-10-CM | POA: Diagnosis not present

## 2024-04-05 DIAGNOSIS — M79675 Pain in left toe(s): Secondary | ICD-10-CM | POA: Diagnosis not present

## 2024-04-05 DIAGNOSIS — M79674 Pain in right toe(s): Secondary | ICD-10-CM

## 2024-04-05 NOTE — Progress Notes (Signed)
  Subjective:  Patient ID: Lindsay Mason, female    DOB: Jul 03, 1951,   MRN: [id number]  Chief Complaint  Patient presents with   Debridement    Trim toenails    73 y.o. female presents for concern of thickened elongated and painful nails that are difficult to trim. Requesting to have them trimmed today.  PCP:  [name], MD    . Denies any other pedal complaints. Denies n/v/f/c.   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 CFT <3 seconds. Normal hair growth on digits. No edema.  Skin. No lacerations or abrasions bilateral feet. Nails 1-5 bilateral are elongated thickened and with subungual debris.  Musculoskeletal: MMT 5/5 bilateral lower extremities in DF, PF, Inversion and Eversion. Deceased ROM in DF of ankle joint.  Neurological: Sensation intact to light touch.   Assessment:   1. Pain due to onychomycosis of toenails of both feet      Plan:  Patient was evaluated and treated and all questions answered. -Mechanically debrided all nails 1-5 bilateral using sterile nail nipper and filed with dremel without incident  -Answered all patient questions -Patient to return  in 3 months for at risk foot care -Patient advised to call the office if any problems or questions arise in the meantime.   Asberry Failing, DPM

## 2024-04-23 ENCOUNTER — Other Ambulatory Visit: Payer: Self-pay | Admitting: Family Medicine

## 2024-04-23 NOTE — Telephone Encounter (Signed)
 Called and spoke to daughter on dpr. States that on nights that patient has been with sitter they only need to give her 1/2 tab. She does not nap during the day when sitter is there. On the days that she is not with sitter she takes a whole tab at night. It does help and would like refill.

## 2024-04-26 NOTE — Telephone Encounter (Signed)
 ERx

## 2024-05-05 ENCOUNTER — Ambulatory Visit (INDEPENDENT_AMBULATORY_CARE_PROVIDER_SITE_OTHER): Payer: Medicare HMO

## 2024-05-05 VITALS — Ht 62.0 in | Wt 141.0 lb

## 2024-05-05 DIAGNOSIS — Z0000 Encounter for general adult medical examination without abnormal findings: Secondary | ICD-10-CM | POA: Diagnosis not present

## 2024-05-05 NOTE — Progress Notes (Signed)
 Subjective:   Lindsay Mason is a 73 y.o. who presents for a Medicare Wellness preventive visit.  As a reminder, Annual Wellness Visits don't include a physical exam, and some assessments may be limited, especially if this visit is performed virtually. We may recommend an in-person follow-up visit with your provider if needed.  Visit Complete: Virtual I connected with son [name]/caretaker and  [name] on 05/05/24 by a audio enabled telemedicine application and verified that I am speaking with the correct person using two identifiers.  Patient Location: Home  Provider Location: Home Office  I discussed the limitations of evaluation and management by telemedicine. The patient expressed understanding and agreed to proceed.  Vital Signs: Because this visit was a virtual/telehealth visit, some criteria may be missing or patient reported. Any vitals not documented were not able to be obtained and vitals that have been documented are patient reported.  VideoDeclined- This patient declined Librarian, academic. Therefore the visit was completed with audio only.  Persons Participating in Visit: Patient. And son [name] Questionnaire: No: Patient Medicare AWV questionnaire was not completed prior to this visit.  Cardiac Risk Factors include: advanced age (>22men, >59 women)     Objective:    Today's Vitals   05/05/24 1304  Weight: 141 lb (64 kg)  Height: 5' 2 (1.575 m)   Body mass index is 25.79 kg/m.     05/05/2024    1:18 PM 01/20/2024    8:04 AM 01/09/2024    6:22 PM 01/09/2024    6:02 AM 05/01/2023   11:17 AM 02/21/2021   10:46 AM 02/07/2021    2:55 PM  Advanced Directives  Does Patient Have a Medical Advance Directive? Yes Yes Yes Yes No Yes Yes  Type of Estate agent of [place];Living will Living will;Healthcare Power of [employer] Power of [place];Living will   Healthcare Power of [place];Living will  Healthcare Power of [place];Living will  Does patient want to make changes to medical advance directive?   No - Patient declined   No - Patient declined   Copy of Healthcare Power of Attorney in Chart? Yes - validated most recent copy scanned in chart (See row information)  No - copy requested   No - copy requested No - copy requested  Would patient like information on creating a medical advance directive?     No - Patient declined      Current Medications (verified) Outpatient Encounter Medications as of 05/05/2024  Medication Sig   albuterol  (VENTOLIN  HFA) 108 (90 Base) MCG/ACT inhaler Inhale 2 puffs into the lungs every 6 (six) hours as needed for wheezing or shortness of breath.   atorvastatin  (LIPITOR) 20 MG tablet Take 1 tablet (20 mg total) by mouth daily.   b complex vitamins capsule Take 1 capsule by mouth daily.   famotidine  (PEPCID ) 20 MG tablet Take 1 tablet (20 mg total) by mouth daily as needed for heartburn or indigestion.   Multiple Vitamin (MULTIVITAMIN) tablet Take 1 tablet by mouth daily.   Rimegepant Sulfate (NURTEC) 75 MG TBDP Take 1 tablet (75 mg total) by mouth as needed.   rivastigmine  (EXELON ) 9.5 mg/24hr Place 1 patch (9.5 mg total) onto the skin daily.   traZODone  (DESYREL ) 50 MG tablet TAKE 0.5-1 TABLETS BY MOUTH AT BEDTIME AS NEEDED FOR SLEEP.   No facility-administered encounter medications on file as of 05/05/2024.    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   by  CT   Past Surgical History:  Procedure Laterality Date   CATARACT EXTRACTION W/PHACO Right 02/21/2021   Procedure: CATARACT EXTRACTION PHACO AND INTRAOCULAR LENS PLACEMENT (IOC) RIGHT;  Surgeon: Mittie Gaskin, MD;  Location[facility];  Service: Ophthalmology;  Laterality: Right;  cde 4.75 00:41.1 minutes 11.5%   CATARACT EXTRACTION W/PHACO Left 03/14/2021   Procedure: CATARACT EXTRACTION PHACO AND INTRAOCULAR LENS PLACEMENT (IOC) LEFT 4.07 00:44.6;  Surgeon: Mittie Gaskin, MD;  Location: [facility];  Service: Ophthalmology;  Laterality: Left;   COLONOSCOPY  06/2015   microscopic colitis [name])   DEXA  10/2008   Osteoporosis   DEXA  02/2014   T score osteopenia hip, osteoporosis spine, scoliosis   ELECTROMAGNETIC NAVIGATION BROCHOSCOPY  01/20/2020   Procedure: ELECTROMAGNETIC NAVIGATION BRONCHOSCOPY;  Surgeon: Brenna Adine CROME, DO;  Location: [facility];  Service: Pulmonary;;   ETT  10/2011   WNL, no evidence of ischemia, excellent exercise tolerance   EXPLORATORY LAPAROTOMY     INTERCOSTAL NERVE BLOCK Right 01/20/2020   Procedure: Intercostal Nerve Block;  Surgeon: Shyrl Linnie KIDD, MD;  Location: [facility] OR;  Service: Thoracic;  Laterality: Right;   MRI head  07/2011   multiple foci deep and subcortical white matter, chronic ischemic vs demyelinating, no active disease   NODE DISSECTION  01/20/2020   Procedure: Node Dissection;  Surgeon: Shyrl Linnie KIDD, MD;  Location: [facility] OR;  Service: Thoracic;;   SUBMUCOSAL TATTOO INJECTION  01/20/2020   Procedure: SUBSTITIAL TATTOO INJECTION;  Surgeon: Brenna Adine CROME, DO;  Location: [facility];  Service: Pulmonary;;   TONSILLECTOMY AND ADENOIDECTOMY     Vein removal     removed from legs   VIDEO ASSISTED THORACOSCOPY (VATS)/WEDGE RESECTION Right 01/20/2020   XI ROBOTIC ASSISTED THORASCOPY-RIGHT LOWER LOBE WEDGE RESECTION, RIGHT LOWER LOBECTOMYRightGeneral   VIDEO BRONCHOSCOPY  01/20/2020   Procedure: VIDEO  BRONCHOSCOPY WITH FLUORO;  Surgeon: Brenna Adine CROME, DO;  Location: [facility];  Service: Pulmonary;;   Family History  Problem Relation Age of Onset   Osteoporosis Other        great grandmother   Stroke Maternal Grandmother    Coronary artery disease Maternal Grandfather 50   Diabetes Maternal Grandfather    Coronary artery disease Paternal Grandmother 62   Coronary artery disease Paternal Grandfather 76   Lung cancer Maternal Aunt    Social History   Socioeconomic History   Marital status: Married    Spouse name: Not on file   Number of children: 3   Years of education: Not on file   Highest education level: Not on file  Occupational History   Occupation: retired  Tobacco Use   Smoking status: Former    Current packs/day: 0.00    Average packs/day: 1 pack/day for 36.7 years (36.7 ttl pk-yrs)    Types: Cigarettes    Start date: 10/14/1978    Quit date: 07/15/2015    Years since quitting: 8.8   Smokeless  tobacco: Never   Tobacco comments:    off and on smoker, now e-cigarettes  Vaping Use   Vaping status: Some Days   Devices: uses occasionally - no nicotine  Substance and Sexual Activity   Alcohol use: Not Currently   Drug use: No   Sexual activity: Not on file  Other Topics Concern   Not on file  Social History Narrative   Caffeine: 1 cup coffee/day   Lives with husband, outside dog   Smoking vapes.   Pt tans regularly - at her pool and in her garden. Wears sunscreen   Social Drivers of Corporate investment banker Strain: Low Risk  (05/05/2024)   Overall Financial Resource Strain (CARDIA)    Difficulty of Paying Living Expenses: Not hard at all  Food Insecurity: No Food Insecurity (05/05/2024)   Hunger Vital Sign    Worried About Running Out of Food in the Last Year: Never true    Ran Out of Food in the Last Year: Never true  Transportation Needs: No Transportation Needs (05/05/2024)   PRAPARE - Administrator, Civil Service (Medical): No    Lack  of Transportation (Non-Medical): No  Physical Activity: Sufficiently Active (05/05/2024)   Exercise Vital Sign    Days of Exercise per Week: 7 days    Minutes of Exercise per Session: 40 min  Stress: No Stress Concern Present (05/05/2024)   [employer] of Occupational Health - Occupational Stress Questionnaire    Feeling of Stress: Only a little  Social Connections: Moderately Isolated (05/05/2024)   Social Connection and Isolation Panel    Frequency of Communication with Friends and Family: Three times a week    Frequency of Social Gatherings with Friends and Family: Three times a week    Attends Religious Services: Never    Active Member of Clubs or Organizations: No    Attends Banker Meetings: Never    Marital Status: Married    Tobacco Counseling Counseling given: Not Answered Tobacco comments: off and on smoker, now e-cigarettes    Clinical Intake:  Pre-visit preparation completed: Yes  Pain : No/denies pain     BMI - recorded: 25.79 Nutritional Status: BMI 25 -29 Overweight Nutritional Risks: None Diabetes: No  No results found for: HGBA1C   How often do you need to have someone help you when you read instructions, pamphlets, or other written materials from your doctor or pharmacy?: 1 - Never  Interpreter Needed?: No  Comments: lives with husband Information entered by :: B.Adel Neyer,LPN   Activities of Daily Living     05/05/2024    1:19 PM 01/09/2024    6:22 PM  In your present state of health, do you have any difficulty performing the following activities:  Hearing? 0   Vision? 1   Difficulty concentrating or making decisions? 1   Walking or climbing stairs? 0   Dressing or bathing? 1   Doing errands, shopping? 1 1  Preparing Food and eating ? N   Using the Toilet? N   In the past six months, have you accidently leaked urine? Y   Do you have problems with loss of bowel control? N   Managing your Medications? Y   Managing your  Finances? Y   Housekeeping or managing your Housekeeping? Y     Patient Care Team: [name], MD as PCP - General (Family Medicine) Caleen Dirks, MD as Consulting Physician (Internal Medicine) [facility], [facility])  I have updated  your Care Teams any recent Medical Services you may have received from other providers in the past year.     Assessment:   This is a routine wellness examination for [place].  Hearing/Vision screen Hearing Screening - Comments:: Pt says her hearing is very good Vision Screening - Comments:: Pt says her vision is blurred: cataract lenses placed a few years ago [facility]   Goals Addressed             This Visit's Progress    Patient Stated   On track    05/05/24, I will continue to walk everyday for 1 hour.        Depression Screen     05/05/2024    1:14 PM 02/04/2024   12:14 PM 01/20/2024    8:15 AM 12/26/2023   12:13 PM 12/05/2023   12:05 PM 11/05/2023   11:06 AM 05/01/2023   11:17 AM  PHQ 2/9 Scores  PHQ - 2 Score 0  0  3  0  PHQ- 9 Score     6    Exception Documentation  Medical reason  Medical reason Patient refusal Medical reason     Fall Risk     05/05/2024    1:10 PM 02/04/2024   12:14 PM 12/26/2023    8:38 AM 12/05/2023   12:05 PM 05/01/2023   11:18 AM  Fall Risk   Falls in the past year? 1 0 0 0 0  Number falls in past yr: 0    0  Injury with Fall? 1    0  Risk for fall due to : Impaired vision;No Fall Risks    No Fall Risks  Follow up Education provided;Falls prevention discussed    Falls prevention discussed;Falls evaluation completed    MEDICARE RISK AT HOME:  Medicare Risk at Home Any stairs in or around the home?: Yes If so, are there any without handrails?: Yes Home free of loose throw rugs in walkways, pet beds, electrical cords, etc?: Yes Adequate lighting in your home to reduce risk of falls?: Yes Life alert?: No Use of a cane, walker or w/c?: No Grab bars in the bathroom?: Yes Shower chair or  bench in shower?: Yes Elevated toilet seat or a handicapped toilet?: Yes  TIMED UP AND GO:  Was the test performed?  No  Cognitive Function: Impaired: Patient has current diagnosis of cognitive impairment.    05/05/2024    1:21 PM 03/02/2024    2:18 PM 02/07/2021    3:00 PM 12/23/2019    2:15 PM 12/22/2018   10:13 AM  MMSE - Mini Mental State Exam  Not completed: Unable to complete  Refused    Orientation to time  0  5 5  Orientation to Place  1  5 5   Registration  3  3 3   Attention/ Calculation  0  5 0  Recall  0  3 0  Recall-comments     unable to recall 3 of 3 words  Language- name 2 objects  2   0  Language- repeat  1  1 1   Language- follow 3 step command  2   3  Language- read & follow direction  0   0  Write a sentence  0   0  Copy design  0   0  Total score  9   17        05/01/2023   11:20 AM  6CIT Screen  What Year? 4 points  What month?  3 points  What time? 0 points  Count back from 20 4 points  Months in reverse 4 points  Repeat phrase 10 points  Total Score 25 points    Immunizations Immunization History  Administered Date(s) Administered   Fluad Quad(high Dose 65+) 08/07/2020, 11/01/2022   Influenza Whole 07/19/2008, 08/24/2012   Influenza, High Dose Seasonal PF 08/11/2018, 06/04/2019, 08/20/2021   Influenza, Seasonal, Injecte, Preservative Fre 07/13/2013   Influenza,inj,Quad PF,6+ Mos 08/22/2015, 06/26/2017   Influenza-Unspecified 08/16/2016   PFIZER(Purple Top)SARS-COV-2 Vaccination 12/13/2019, 01/11/2020   Pneumococcal Conjugate-13 06/11/2017   Pneumococcal Polysaccharide-23 01/01/2012, 02/26/2019   Td 04/04/2004   Tdap 02/01/2016   Zoster, Live 10/13/2013    Screening Tests Health Maintenance  Topic Date Due   Zoster Vaccines- Shingrix (1 of 2) 02/15/1970   MAMMOGRAM  06/26/2019   COVID-19 Vaccine (3 - Pfizer risk series) 02/08/2020   INFLUENZA VACCINE  05/14/2024   Medicare Annual Wellness (AWV)  05/05/2025   Colonoscopy  06/26/2025    DTaP/Tdap/Td (3 - Td or Tdap) 01/31/2026   Pneumococcal Vaccine: 50+ Years  Completed   DEXA SCAN  Completed   Hepatitis C Screening  Completed   Hepatitis B Vaccines  Aged Out   HPV VACCINES  Aged Out   Meningococcal B Vaccine  Aged Out    Health Maintenance  Health Maintenance Due  Topic Date Due   Zoster Vaccines- Shingrix (1 of 2) 02/15/1970   MAMMOGRAM  06/26/2019   COVID-19 Vaccine (3 - Pfizer risk series) 02/08/2020   Health Maintenance Items Addressed: None at this time Pt son does not want to pt to have MMG :says would be trauma and unpleasant for pt right now Will go to pharmacy IF they decide to get Shingrix  Additional Screening:  Vision Screening: Recommended annual ophthalmology exams for early detection of glaucoma and other disorders of the eye. Would you like a referral to an eye doctor? No    Dental Screening: Recommended annual dental exams for proper oral hygiene  Community Resource Referral / Chronic Care Management: CRR required this visit?  No   CCM required this visit?  No   Plan:    I have personally reviewed and noted the following in the patient's chart:   Medical and social history Use of alcohol, tobacco or illicit drugs  Current medications and supplements including opioid prescriptions. Patient is not currently taking opioid prescriptions. Functional ability and status Nutritional status Physical activity Advanced directives List of other physicians Hospitalizations, surgeries, and ER visits in previous 12 months Vitals Screenings to include cognitive, depression, and falls Referrals and appointments  In addition, I have reviewed and discussed with patient certain preventive protocols, quality metrics, and best practice recommendations. A written personalized care plan for preventive services as well as general preventive health recommendations were provided to patient.   Erminio LITTIE Saris, LPN   [id number]   After Visit Summary:  (MyChart) Due to this being a telephonic visit, the after visit summary with patients personalized plan was offered to patient via MyChart   Notes: Nothing significant to report at this time.

## 2024-05-05 NOTE — Patient Instructions (Signed)
 Ms. Sarchet , Thank you for taking time out of your busy schedule to complete your Annual Wellness Visit with me. I enjoyed our conversation and look forward to speaking with you again next year. I, as well as your care team,  appreciate your ongoing commitment to your health goals. Please review the following plan we discussed and let me know if I can assist you in the future. Your Game plan/ To Do List     Follow up Visits: Next Medicare AWV with our clinical staff: 05/06/25 @ 1pm televisit   Have you seen your provider in the last 6 months (3 months if uncontrolled diabetes)? Yes Next Office Visit with your provider: 05/07/24  Clinician Recommendations:  Aim for 30 minutes of exercise or brisk walking, 6-8 glasses of water, and 5 servings of fruits and vegetables each day.       This is a list of the screening recommended for you and due dates:  Health Maintenance  Topic Date Due   Zoster (Shingles) Vaccine (1 of 2) 02/15/1970   Mammogram  06/26/2019   COVID-19 Vaccine (3 - Pfizer risk series) 02/08/2020   Flu Shot  05/14/2024   Medicare Annual Wellness Visit  05/05/2025   Colon Cancer Screening  06/26/2025   DTaP/Tdap/Td vaccine (3 - Td or Tdap) 01/31/2026   Pneumococcal Vaccine for age over 37  Completed   DEXA scan (bone density measurement)  Completed   Hepatitis C Screening  Completed   Hepatitis B Vaccine  Aged Out   HPV Vaccine  Aged Out   Meningitis B Vaccine  Aged Out    Advanced directives: (In Chart) A copy of your advanced directives are scanned into your chart should your provider ever need it. Advance Care Planning is important because it:  [x]  Makes sure you receive the medical care that is consistent with your values, goals, and preferences  [x]  It provides guidance to your family and loved ones and reduces their decisional burden about whether or not they are making the right decisions based on your wishes.  Follow the link provided in your after visit summary  or read over the paperwork we have mailed to you to help you started getting your Advance Directives in place. If you need assistance in completing these, please reach out to us  so that we can help you!

## 2024-05-07 ENCOUNTER — Encounter: Payer: Self-pay | Admitting: Family Medicine

## 2024-05-07 ENCOUNTER — Ambulatory Visit (INDEPENDENT_AMBULATORY_CARE_PROVIDER_SITE_OTHER): Admitting: Family Medicine

## 2024-05-07 VITALS — BP 122/84 | HR 94 | Temp 98.8°F | Ht 62.0 in | Wt 135.2 lb

## 2024-05-07 DIAGNOSIS — R9082 White matter disease, unspecified: Secondary | ICD-10-CM

## 2024-05-07 DIAGNOSIS — F05 Delirium due to known physiological condition: Secondary | ICD-10-CM | POA: Diagnosis not present

## 2024-05-07 DIAGNOSIS — R634 Abnormal weight loss: Secondary | ICD-10-CM | POA: Diagnosis not present

## 2024-05-07 DIAGNOSIS — E538 Deficiency of other specified B group vitamins: Secondary | ICD-10-CM | POA: Diagnosis not present

## 2024-05-07 DIAGNOSIS — M85851 Other specified disorders of bone density and structure, right thigh: Secondary | ICD-10-CM

## 2024-05-07 DIAGNOSIS — Z0000 Encounter for general adult medical examination without abnormal findings: Secondary | ICD-10-CM | POA: Diagnosis not present

## 2024-05-07 DIAGNOSIS — G300 Alzheimer's disease with early onset: Secondary | ICD-10-CM | POA: Diagnosis not present

## 2024-05-07 DIAGNOSIS — R519 Headache, unspecified: Secondary | ICD-10-CM

## 2024-05-07 DIAGNOSIS — S2242XD Multiple fractures of ribs, left side, subsequent encounter for fracture with routine healing: Secondary | ICD-10-CM

## 2024-05-07 DIAGNOSIS — C3492 Malignant neoplasm of unspecified part of left bronchus or lung: Secondary | ICD-10-CM

## 2024-05-07 DIAGNOSIS — I739 Peripheral vascular disease, unspecified: Secondary | ICD-10-CM | POA: Diagnosis not present

## 2024-05-07 DIAGNOSIS — Z7189 Other specified counseling: Secondary | ICD-10-CM

## 2024-05-07 DIAGNOSIS — F02C18 Dementia in other diseases classified elsewhere, severe, with other behavioral disturbance: Secondary | ICD-10-CM

## 2024-05-07 DIAGNOSIS — R7401 Elevation of levels of liver transaminase levels: Secondary | ICD-10-CM | POA: Diagnosis not present

## 2024-05-07 DIAGNOSIS — I998 Other disorder of circulatory system: Secondary | ICD-10-CM | POA: Diagnosis not present

## 2024-05-07 MED ORDER — NORTRIPTYLINE HCL 25 MG PO CAPS: 25.0000 mg | ORAL_CAPSULE | Freq: Every day | ORAL | 3 refills | Status: DC

## 2024-05-07 NOTE — Progress Notes (Signed)
 Ph: [phone] Fax: [phone]   Patient ID: Lindsay Mason, female    DOB: 22-Feb-1951, 73 y.o.   MRN: [id number]  This visit was conducted in person.  BP 122/84   Pulse 94   Temp 98.8 F (37.1 C) (Oral)   Ht 5' 2 (1.575 m)   Wt 135 lb 4 oz (61.3 kg)   SpO2 95%   BMI 24.74 kg/m    CC: CPE Subjective:   HPI: Lindsay Mason is a 73 y.o. female presenting on 05/07/2024 for Medical Management of Chronic Issues (In office with son Lindsay Mason and husband Lindsay )   Saw health advisor Wednesday for medicare wellness visit. Note reviewed.  Failed cognitive assessment, known dementia.   No results found.  Flowsheet Row Office Visit from 05/07/2024 in [facility] [place]  PHQ-2 Total Score 4       05/07/2024    2:47 PM 05/05/2024    1:10 PM 02/04/2024   12:14 PM 12/26/2023    8:38 AM 12/05/2023   12:05 PM  Fall Risk   Falls in the past year? 1 1 0 0 0  Number falls in past yr: 0 0     Injury with Fall? 1 1     Risk for fall due to : No Fall Risks Impaired vision;No Fall Risks     Follow up Falls evaluation completed Education provided;Falls prevention discussed      Progressive alzheimer's dementia - saw Dr Chalice 02/2024, alzheimer's panel returned positive. No treatment available for advanced dementia. Aricept  may have caused headache. Now on rivastigmine  patch 9.5mg  daily which she tolerates well.   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   Saw rad onc Dr Patrcia 01/20/2024 for LEFT stage 1A NSCLC LLL adenocarcinoma planned SBRT. Recent fall with rib fracture - was recommended rib fully healing prior to restarting radiation therapy to the same area. Planning to repeat lung scan then reassess growth.   Trazodone  25-50mg  nightly started last month due to poor sleep. This has helped.  Chronic headache in h/o migraines - saw neurology, who provided with samples for Nurtec with benefit however  this was unaffordable.migraines run in the family.     Preventative: COLONOSCOPY Date: 06/2015 microscopic colitis [name]). No blood in stool or bowel changes recently. Defer further evaluation due to dementia.  Well woman exam - last well woman exam with Mallie Gaskins NP 02/2016 - WNL. Aged out.  Breast cancer screening - last mammogram 06/2017 Birads1 @ [facility] center - desire to defer due to dementia  DEXA 06/2017 - hip -2.2, forearm -1.9. Scoliosis.  DEXA 03/2023 - T -2.3 RFN, increased hip fracture risk 3.4%  Good calcium  in diet. Avoids dairy but eats greek yogurt. Last vit D 72 (2012). Regularly gets weight bearing exercises - walks 1-2 hours/day.  Lung cancer screening - known R lung cancer s/p excision - see above regarding L lung cancer Flu shot yearly COVID vaccine Pfizer 12/2019 x2, booster 09/2020.  Tetanus shot - 2005, Tdap 2017 Pneumovax 12/2011, prevnar-13 05/2017.  zostavax - 2014  shingrix - has not completed Advanced directive - received, scanned 08/2023. Husband Lindsay Mason followed by son Lindsay Mason and Lindsay Mason are HCPOA. Does not want prolonged life support if terminal condition.  Seat belt use discussed Sunscreen use discussed. Has not seen dermatology  Ex smoker - quit 07/2015 Alcohol - rare  Dentist q6 mo  Eye exam yearly  Bowels - no constipation Bladder - wearing depends due to urinary incontinence   Caffeine: 1 cup coffee/day  Lives with husband, outside dog  Activity: Very active at her pool and in her garden. Walking 1-2 hours/day.  Diet: good water, fruits/vegetables daily, greek yogurt     Relevant past medical, surgical, family and social history reviewed and updated as indicated. Interim medical history since our last visit reviewed. Allergies and medications reviewed and updated. Outpatient Medications Prior to Visit  Medication Sig Dispense Refill   albuterol  (VENTOLIN  HFA) 108 (90 Base) MCG/ACT inhaler Inhale 2 puffs into the lungs every 6 (six) hours as  needed for wheezing or shortness of breath. 8 g 6   b complex vitamins capsule Take 1 capsule by mouth daily.     famotidine  (PEPCID ) 20 MG tablet Take 1 tablet (20 mg total) by mouth daily as needed for heartburn or indigestion.     Multiple Vitamin (MULTIVITAMIN) tablet Take 1 tablet by mouth daily.     rivastigmine  (EXELON ) 9.5 mg/24hr Place 1 patch (9.5 mg total) onto the skin daily. 30 patch 3   atorvastatin  (LIPITOR) 20 MG tablet Take 1 tablet (20 mg total) by mouth daily. 30 tablet 11   traZODone  (DESYREL ) 50 MG tablet TAKE 0.5-1 TABLETS BY MOUTH AT BEDTIME AS NEEDED FOR SLEEP. 30 tablet 6   Rimegepant Sulfate (NURTEC) 75 MG TBDP Take 1 tablet (75 mg total) by mouth as needed. (Patient not taking: Reported on 05/07/2024) 12 tablet 0   No facility-administered medications prior to visit.     Per HPI unless specifically indicated in ROS section below Review of Systems  Constitutional:  Negative for activity change, appetite change, chills, fatigue, fever and unexpected weight change.  HENT:  Negative for hearing loss.   Eyes:  Negative for visual disturbance.  Respiratory:  Negative for cough, chest tightness, shortness of breath and wheezing.   Cardiovascular:  Negative for chest pain, palpitations and leg swelling.  Gastrointestinal:  Negative for abdominal distention, abdominal pain, blood in stool, constipation, diarrhea, nausea and vomiting.  Genitourinary:  Negative for difficulty urinating and hematuria.  Musculoskeletal:  Negative for arthralgias, myalgias and neck pain.  Skin:  Negative for rash.  Neurological:  Positive for headaches. Negative for dizziness, seizures and syncope.  Hematological:  Negative for adenopathy. Does not bruise/bleed easily.  Psychiatric/Behavioral:  Negative for dysphoric mood. The patient is not nervous/anxious.     Objective:  BP 122/84   Pulse 94   Temp 98.8 F (37.1 C) (Oral)   Ht 5' 2 (1.575 m)   Wt 135 lb 4 oz (61.3 kg)   SpO2 95%    BMI 24.74 kg/m   Wt Readings from Last 3 Encounters:  05/07/24 135 lb 4 oz (61.3 kg)  05/05/24 141 lb (64 kg)  03/02/24 141 lb (64 kg)      Physical Exam Vitals and nursing note reviewed.  Constitutional:      Appearance: Normal appearance. She is not ill-appearing.  HENT:     Head: Normocephalic and atraumatic.     Right Ear: Tympanic membrane, ear canal and external ear normal. There is no impacted cerumen.     Left Ear: Tympanic membrane, ear canal and external ear normal. There is no impacted cerumen.  Eyes:     General:        Right eye: No discharge.        Left eye: No discharge.     Extraocular Movements: Extraocular movements intact.  Conjunctiva/sclera: Conjunctivae normal.     Pupils: Pupils are equal, round, and reactive to light.  Neck:     Thyroid : No thyroid  mass or thyromegaly.  Cardiovascular:     Rate and Rhythm: Normal rate and regular rhythm.     Pulses: Normal pulses.     Heart sounds: Normal heart sounds. No murmur heard. Pulmonary:     Effort: Pulmonary effort is normal. No respiratory distress.     Breath sounds: Normal breath sounds. No wheezing, rhonchi or rales.  Abdominal:     General: Bowel sounds are normal. There is no distension.     Palpations: Abdomen is soft. There is no mass.     Tenderness: There is no abdominal tenderness. There is no guarding or rebound.     Hernia: No hernia is present.  Musculoskeletal:     Cervical back: Normal range of motion and neck supple. No rigidity.     Right lower leg: No edema.     Left lower leg: No edema.  Lymphadenopathy:     Cervical: No cervical adenopathy.  Skin:    General: Skin is warm and dry.     Findings: No rash.  Neurological:     General: No focal deficit present.     Mental Status: She is alert. Mental status is at baseline.  Psychiatric:        Mood and Affect: Mood normal.        Behavior: Behavior normal.       Results for orders placed or performed in visit on 03/02/24   ATN PROFILE   Collection Time: 03/02/24  3:54 PM  Result Value Ref Range   A -- Beta-amyloid 42/40 Ratio 0.099 (L) >0.102   Beta-amyloid 42 24.08 pg/mL   Beta-amyloid 40 243.69 pg/mL   T -- p-tau181 1.71 (H) 0.00 - 0.97 pg/mL   N -- NfL, Plasma 8.02 (H) 0.00 - 6.04 pg/mL   ATN SUMMARY Comment    Information: Comment   Comprehensive metabolic panel with GFR   Collection Time: 03/02/24  3:54 PM  Result Value Ref Range   Glucose 85 70 - 99 mg/dL   BUN 20 8 - 27 mg/dL   Creatinine, Ser 9.12 0.57 - 1.00 mg/dL   eGFR 70 >40 fO/fpw/8.26   BUN/Creatinine Ratio 23 12 - 28   Sodium 144 134 - 144 mmol/L   Potassium 4.5 3.5 - 5.2 mmol/L   Chloride 105 96 - 106 mmol/L   CO2 26 20 - 29 mmol/L   Calcium  9.1 8.7 - 10.3 mg/dL   Total Protein 6.1 6.0 - 8.5 g/dL   Albumin  3.9 3.8 - 4.8 g/dL   Globulin, Total 2.2 1.5 - 4.5 g/dL   Bilirubin Total 0.2 0.0 - 1.2 mg/dL   Alkaline Phosphatase 184 (H) 44 - 121 IU/L   AST 21 0 - 40 IU/L   ALT 39 (H) 0 - 32 IU/L  Sedimentation rate   Collection Time: 03/02/24  3:54 PM  Result Value Ref Range   Sed Rate 17 0 - 40 mm/hr  ANA w/Reflex   Collection Time: 03/02/24  3:54 PM  Result Value Ref Range   Anti Nuclear Antibody (ANA) Negative Negative  Protein electrophoresis, serum   Collection Time: 03/02/24  3:54 PM  Result Value Ref Range   Albumin  ELP 3.3 2.9 - 4.4 g/dL   Alpha 1 0.4 0.0 - 0.4 g/dL   Alpha 2 0.8 0.4 - 1.0 g/dL   Beta 0.9 0.7 -  1.3 g/dL   Gamma Globulin 0.6 0.4 - 1.8 g/dL   M-Spike, % Not Observed Not Observed g/dL   Globulin, Total 2.8 2.2 - 3.9 g/dL   A/G Ratio 1.2 0.7 - 1.7   Please Note: Comment    Interpretation: Comment   TSH + free T4   Collection Time: 03/02/24  3:54 PM  Result Value Ref Range   TSH 2.100 0.450 - 4.500 uIU/mL   Free T4 0.99 0.82 - 1.77 ng/dL  Vitamin B12   Collection Time: 03/02/24  3:54 PM  Result Value Ref Range   Vitamin B-12 542 232 - 1,245 pg/mL  CBC with Differential/Platelet   Collection  Time: 03/02/24  3:54 PM  Result Value Ref Range   WBC 8.8 3.4 - 10.8 x10E3/uL   RBC 3.71 (L) 3.77 - 5.28 x10E6/uL   Hemoglobin 11.5 11.1 - 15.9 g/dL   Hematocrit 65.4 65.9 - 46.6 %   MCV 93 79 - 97 fL   MCH 31.0 26.6 - 33.0 pg   MCHC 33.3 31.5 - 35.7 g/dL   RDW 87.6 88.2 - 84.5 %   Platelets 338 150 - 450 x10E3/uL   Neutrophils 69 Not Estab. %   Lymphs 21 Not Estab. %   Monocytes 6 Not Estab. %   Eos 3 Not Estab. %   Basos 1 Not Estab. %   Neutrophils Absolute 6.0 1.4 - 7.0 x10E3/uL   Lymphocytes Absolute 1.9 0.7 - 3.1 x10E3/uL   Monocytes Absolute 0.6 0.1 - 0.9 x10E3/uL   EOS (ABSOLUTE) 0.3 0.0 - 0.4 x10E3/uL   Basophils Absolute 0.1 0.0 - 0.2 x10E3/uL   Immature Granulocytes 0 Not Estab. %   Immature Grans (Abs) 0.0 0.0 - 0.1 x10E3/uL   Lab Results  Component Value Date   CHOL 191 12/05/2023   HDL 56.40 12/05/2023   LDLCALC 114 (H) 12/05/2023   TRIG 105.0 12/05/2023   CHOLHDL 3 12/05/2023   Lab Results  Component Value Date   VD25OH 46.76 04/28/2023   Assessment & Plan:   Problem List Items Addressed This Visit     Health maintenance examination - Primary (Chronic)   Preventative protocols reviewed and updated unless pt declined. Discussed healthy diet and lifestyle.       Advanced care planning/counseling discussion (Chronic)   Advanced directive - received, scanned 08/2023. Husband Lindsay Mason followed by son Lindsay Mason and Lindsay Mason are HCPOA. Does not want prolonged life support if terminal condition.  Reviewed code status, reviewed risks of CPR and poor chances of recovery if this is needed. Encouraged continued discussion with family in dementia history.       Osteopenia   Continue to encourage dietary calcium  intake. Vitamin D  levels normal.       Headache   Ongoing headache. Didn't improve off Excelon patch.  Nurtec abortive was helpful but unaffordable.  In h/o migraines, will treat as such with TCA. Nortriptyline  chosen to try and minimize  anticholinergic side effect.  Start nortriptyline  25mg  nightly in place of trazodone . This may also help sleep and appetite.       Relevant Medications   nortriptyline  (PAMELOR ) 25 MG capsule   Weight loss   6 lb weight loss over the past 2 months - family attributes to healthier diet.  See below re: nortriptyline  commencement.       Severe early onset Alzheimer dementia [facility])   Appreciate neurology evaluation.  Severe alzheimer's, abnormal alzheimer blood panel.  She continues Excelon patch at 9.5mg  daily dose.  Aricept  intolerance - worsening GI upset, headache.       Relevant Medications   nortriptyline  (PAMELOR ) 25 MG capsule   Vitamin B12 deficiency   Latest b12 normal on oral b complex replacement - continue       Sundowning   Doing better on trazodone  at night - see below re: switch from trazodone  to nortriptyline .       PAD (peripheral artery disease) (HCC)   On atorvastatin  since 11/2023 after abnormal house calls circulation evaluation (Volume Plethysmography (VPS) L 0.18 severe, R 0.42 moderate (10/2023) ) She has always been asymptomatic from PAD status.  See below re: statin recommendations.       Non-small cell carcinoma of left lung, stage 1 (HCC)   Appreciate rad onc care. Pending rpt chest imaging then reassess treatment options.       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 Will stop statin, reassess LFTs afterwards.        Meds ordered this encounter  Medications   nortriptyline  (PAMELOR ) 25 MG capsule    Sig: Take 1 capsule (25 mg total) by mouth at bedtime.    Dispense:  30 capsule    Refill:  3    To replace trazodone     No orders of the defined types were placed in this encounter.   Patient Instructions  Instead of trazodone , use nortriptyline  25mg   at bedtime for sleep and headache prevention.  Consider code status - ?compressions, shock, intubation.  Stop atorvastatin ., we can recheck liver function next labwork  Good to see you today Return in 4-6 months for follow up visit   Follow up plan: Return in about 5 months (around 10/07/2024) for follow up visit.  Anton Blas, MD

## 2024-05-07 NOTE — Assessment & Plan Note (Signed)
 Preventative protocols reviewed and updated unless pt declined. Discussed healthy diet and lifestyle.

## 2024-05-07 NOTE — Assessment & Plan Note (Addendum)
 Advanced directive - received, scanned 08/2023. Husband Lindsay Mason followed by son Lindsay Mason and [place] are HCPOA. Does not want prolonged life support if terminal condition.  Reviewed code status, reviewed risks of CPR and poor chances of recovery if this is needed. Encouraged continued discussion with family in dementia history.

## 2024-05-07 NOTE — Patient Instructions (Addendum)
 Instead of trazodone , use nortriptyline 25mg  at bedtime for sleep and headache prevention.  Consider code status - ?compressions, shock, intubation.  Stop atorvastatin ., we can recheck liver function next labwork  Good to see you today Return in 4-6 months for follow up visit

## 2024-05-09 ENCOUNTER — Encounter: Payer: Self-pay | Admitting: Family Medicine

## 2024-05-09 DIAGNOSIS — R7401 Elevation of levels of liver transaminase levels: Secondary | ICD-10-CM | POA: Insufficient documentation

## 2024-05-09 NOTE — Assessment & Plan Note (Addendum)
 Continues recovering from fall 01/2024

## 2024-05-09 NOTE — Assessment & Plan Note (Signed)
 Doing better on trazodone  at night - see below re: switch from trazodone  to nortriptyline .

## 2024-05-09 NOTE — Assessment & Plan Note (Signed)
 Continue to encourage dietary calcium  intake. Vitamin D  levels normal.

## 2024-05-09 NOTE — Assessment & Plan Note (Signed)
 Resolved with weight loss.

## 2024-05-09 NOTE — Assessment & Plan Note (Signed)
 Appreciate rad onc care. Pending rpt chest imaging then reassess treatment options.

## 2024-05-09 NOTE — Assessment & Plan Note (Addendum)
 Ongoing headache. Didn't improve off Excelon patch.  Nurtec abortive was helpful but unaffordable.  In h/o migraines, will treat as such with TCA. Nortriptyline  chosen to try and minimize anticholinergic side effect.  Start nortriptyline  25mg  nightly in place of trazodone . This may also help sleep and appetite.

## 2024-05-09 NOTE — Assessment & Plan Note (Addendum)
 On atorvastatin  since 11/2023 after abnormal house calls circulation evaluation (Volume Plethysmography (VPS) L 0.18 severe, R 0.42 moderate (10/2023) ) She has always been asymptomatic from PAD status.  See below re: statin recommendations.

## 2024-05-09 NOTE — Assessment & Plan Note (Addendum)
 Mild, newly noted since starting atorvastatin  11/2023. Will stop statin, reassess LFTs afterwards.

## 2024-05-09 NOTE — Assessment & Plan Note (Addendum)
 Appreciate neurology evaluation.  Severe alzheimer's, abnormal alzheimer blood panel.  She continues Excelon patch at 9.5mg  daily dose. Aricept  intolerance - worsening GI upset, headache.

## 2024-05-09 NOTE — Assessment & Plan Note (Addendum)
 Latest b12 normal on oral b complex replacement - continue

## 2024-05-09 NOTE — Assessment & Plan Note (Signed)
 6 lb weight loss over the past 2 months - family attributes to healthier diet.  See below re: nortriptyline  commencement.

## 2024-05-31 ENCOUNTER — Emergency Department (HOSPITAL_COMMUNITY)

## 2024-05-31 ENCOUNTER — Other Ambulatory Visit: Payer: Self-pay

## 2024-05-31 ENCOUNTER — Emergency Department (HOSPITAL_COMMUNITY)
Admission: EM | Admit: 2024-05-31 | Discharge: 2024-06-01 | Disposition: A | Discharge status: Home / Self Care | Attending: Emergency Medicine | Admitting: Emergency Medicine

## 2024-05-31 ENCOUNTER — Encounter (HOSPITAL_COMMUNITY): Payer: Self-pay | Admitting: Emergency Medicine

## 2024-05-31 ENCOUNTER — Ambulatory Visit: Payer: Self-pay

## 2024-05-31 DIAGNOSIS — R110 Nausea: Secondary | ICD-10-CM | POA: Insufficient documentation

## 2024-05-31 DIAGNOSIS — R5383 Other fatigue: Secondary | ICD-10-CM | POA: Insufficient documentation

## 2024-05-31 DIAGNOSIS — D72829 Elevated white blood cell count, unspecified: Secondary | ICD-10-CM | POA: Insufficient documentation

## 2024-05-31 DIAGNOSIS — K5730 Diverticulosis of large intestine without perforation or abscess without bleeding: Secondary | ICD-10-CM | POA: Diagnosis not present

## 2024-05-31 DIAGNOSIS — R1030 Lower abdominal pain, unspecified: Secondary | ICD-10-CM | POA: Insufficient documentation

## 2024-05-31 DIAGNOSIS — K769 Liver disease, unspecified: Secondary | ICD-10-CM | POA: Diagnosis not present

## 2024-05-31 DIAGNOSIS — R100 Acute abdomen: Secondary | ICD-10-CM | POA: Diagnosis not present

## 2024-05-31 DIAGNOSIS — R4182 Altered mental status, unspecified: Secondary | ICD-10-CM | POA: Diagnosis not present

## 2024-05-31 DIAGNOSIS — K5792 Diverticulitis of intestine, part unspecified, without perforation or abscess without bleeding: Secondary | ICD-10-CM | POA: Diagnosis not present

## 2024-05-31 DIAGNOSIS — R410 Disorientation, unspecified: Secondary | ICD-10-CM | POA: Insufficient documentation

## 2024-05-31 DIAGNOSIS — I10 Essential (primary) hypertension: Secondary | ICD-10-CM | POA: Insufficient documentation

## 2024-05-31 DIAGNOSIS — J449 Chronic obstructive pulmonary disease, unspecified: Secondary | ICD-10-CM | POA: Insufficient documentation

## 2024-05-31 DIAGNOSIS — I700 Atherosclerosis of aorta: Secondary | ICD-10-CM | POA: Diagnosis not present

## 2024-05-31 DIAGNOSIS — F0390 Unspecified dementia without behavioral disturbance: Secondary | ICD-10-CM | POA: Insufficient documentation

## 2024-05-31 DIAGNOSIS — R509 Fever, unspecified: Secondary | ICD-10-CM | POA: Insufficient documentation

## 2024-05-31 DIAGNOSIS — R109 Unspecified abdominal pain: Secondary | ICD-10-CM | POA: Diagnosis not present

## 2024-05-31 DIAGNOSIS — R932 Abnormal findings on diagnostic imaging of liver and biliary tract: Secondary | ICD-10-CM | POA: Diagnosis not present

## 2024-05-31 LAB — COMPREHENSIVE METABOLIC PANEL WITH GFR
ALT: 27 U/L (ref 0–44)
AST: 27 U/L (ref 15–41)
Albumin: 3.8 g/dL (ref 3.5–5.0)
Alkaline Phosphatase: 70 U/L (ref 38–126)
Anion gap: 10 (ref 5–15)
BUN: 26 mg/dL — ABNORMAL HIGH (ref 8–23)
CO2: 22 mmol/L (ref 22–32)
Calcium: 8.9 mg/dL (ref 8.9–10.3)
Chloride: 104 mmol/L (ref 98–111)
Creatinine, Ser: 0.67 mg/dL (ref 0.44–1.00)
GFR, Estimated: >60 mL/min (ref >60)
Glucose, Bld: 150 mg/dL — ABNORMAL HIGH (ref 70–99)
Potassium: 3.7 mmol/L (ref 3.5–5.1)
Sodium: 136 mmol/L (ref 135–145)
Total Bilirubin: 0.9 mg/dL (ref 0.0–1.2)
Total Protein: 6.5 g/dL (ref 6.5–8.1)

## 2024-05-31 LAB — CBC
HCT: 41 % (ref 36.0–46.0)
Hemoglobin: 13.4 g/dL (ref 12.0–15.0)
MCH: 29.8 pg (ref 26.0–34.0)
MCHC: 32.7 g/dL (ref 30.0–36.0)
MCV: 91.3 fL (ref 80.0–100.0)
Platelets: 254 K/uL (ref 150–400)
RBC: 4.49 MIL/uL (ref 3.87–5.11)
RDW: 14.8 % (ref 11.5–15.5)
WBC: 20.2 K/uL — ABNORMAL HIGH (ref 4.0–10.5)
nRBC: 0 % (ref 0.0–0.2)

## 2024-05-31 LAB — LIPASE, BLOOD: Lipase: 27 U/L (ref 11–51)

## 2024-05-31 NOTE — ED Notes (Signed)
 Name was called for vital reassessment but no one came forward

## 2024-05-31 NOTE — ED Triage Notes (Signed)
 Patient c/o generalized abdominal pain x 3 day. Patient report nausea and vomiting x3 last Friday. Per report UC recommended to come to ED for further workup. Family report increase confusion today.

## 2024-05-31 NOTE — Telephone Encounter (Signed)
 FYI Only or Action Required?: Action required by provider: update on patient condition.  Patient was last seen in primary care on 05/07/2024 by [name], MD.  Called Nurse Triage reporting Abdominal Pain and Vomiting.  Symptoms began several days ago.  Interventions attempted: Rest, hydration, or home remedies.  Symptoms are: rapidly worsening.  Triage Disposition: Go to ED Now (or PCP Triage)  Patient/caregiver understands and will follow disposition?: No  Copied from CRM [id number]. Topic: Clinical - Red Word Triage >> May 31, 2024 12:09 PM Berneda FALCON wrote: Red Word that prompted transfer to Nurse Triage: Daughter states that the patient is in late stage dementia and is now having abdominal pains and vomiting over this weekend. Reason for Disposition  [1] MODERATE to SEVERE vomiting (e.g., 3 or more times/day) AND [2] age > 60 years  Answer Assessment - Initial Assessment Questions 1. VOMITING SEVERITY: How many times have you vomited in the past 24 hours?      Dry heaves earlier while daughter speaking on phone 2. ONSET: When did the vomiting begin?      Friday 3. FLUIDS: What fluids or food have you vomited up today? Have you been able to keep any fluids down?     Able to hold down fluids intermittently 4. ABDOMEN PAIN: Are your having any abdomen pain? If Yes : How bad is it and what does it feel like? (e.g., crampy, dull, intermittent, constant)      Started today-appears severe. Poor reporter due to dementia 5. DIARRHEA: Is there any diarrhea? If Yes, ask: How many times today?      denies 6. CONTACTS: Is there anyone else in the family with the same symptoms?       7. CAUSE: What do you think is causing your vomiting?     Unsure-vomiting is common for her X 1 year intermittent, never has pain in the past.  8. HYDRATION STATUS: Any signs of dehydration? (e.g., dry mouth [not only dry lips], too weak to stand) When did you last urinate?      She is eating and drinking.  9. OTHER SYMPTOMS: Do you have any other symptoms? (e.g., fever, headache, vertigo, vomiting blood or coffee grounds, recent head injury)     None other reported-she has dementia.  Additional info: Daughter calling she is not currently with the patient, got caregiver Carley on three way for more information. Emergency room evaluation advised, caregiver raised voice and stated she doesn't understand why she can't go to her doctors.  Explained due to the severity of her symptoms she needs emergent assessment and potential testing/imaging that is not performed in office, they verbalized understanding and plans on going to urgent care vs ER. They verbalize understanding that UC may ask them to proceed to ER.  Protocols used: Vomiting-A-AH

## 2024-05-31 NOTE — ED Provider Triage Note (Signed)
 Emergency Medicine Provider Triage Evaluation Note  Lindsay Mason , a 73 y.o. female  was evaluated in triage.  Pt complains of generalized abdominal pain starting today accompanied by n/v/d x 3 days. Today, has also been fatigued and increased confusion defined by son who says that she has been seeing things that aren't there, which is abnormal for her, reported by son who is at bedside. Tolerating fluids at this time with no nausea.  Endorses fever, body aches, chills.  Denies cough, congestion, chest pain, shortness of breath, hematemesis, hematochezia, melena, dysuria, LE swelling.   Review of Systems  Positive: N/a Negative: N/a  Physical Exam  BP 108/80   Pulse 97   Temp (!) 100.9 F (38.3 C)   Resp 16   Ht 5' 2 (1.575 m)   Wt 61.3 kg   SpO2 97%   BMI 24.72 kg/m  Gen:   Awake, no distress   Resp:  Normal effort  MSK:   Moves extremities without difficulty  Other:    Medical Decision Making  Medically screening exam initiated at 9:24 PM.  Appropriate orders placed.  PETINA MURASKI was informed that the remainder of the evaluation will be completed by another provider, this initial triage assessment does not replace that evaluation, and the importance of remaining in the ED until their evaluation is complete.     Beola Terrall RAMAN, [place] 05/31/24 2130

## 2024-06-01 ENCOUNTER — Emergency Department (HOSPITAL_COMMUNITY)

## 2024-06-01 DIAGNOSIS — K769 Liver disease, unspecified: Secondary | ICD-10-CM | POA: Diagnosis not present

## 2024-06-01 DIAGNOSIS — R932 Abnormal findings on diagnostic imaging of liver and biliary tract: Secondary | ICD-10-CM | POA: Diagnosis not present

## 2024-06-01 DIAGNOSIS — R109 Unspecified abdominal pain: Secondary | ICD-10-CM | POA: Diagnosis not present

## 2024-06-01 DIAGNOSIS — K5730 Diverticulosis of large intestine without perforation or abscess without bleeding: Secondary | ICD-10-CM | POA: Diagnosis not present

## 2024-06-01 LAB — URINALYSIS, W/ REFLEX TO CULTURE (INFECTION SUSPECTED)
Bacteria, UA: NONE SEEN
Bilirubin Urine: NEGATIVE
Glucose, UA: NEGATIVE mg/dL
Hgb urine dipstick: NEGATIVE
Ketones, ur: NEGATIVE mg/dL
Leukocytes,Ua: NEGATIVE
Nitrite: NEGATIVE
Protein, ur: NEGATIVE mg/dL
Specific Gravity, Urine: 1.01 (ref 1.005–1.030)
pH: 6 (ref 5.0–8.0)

## 2024-06-01 LAB — RESP PANEL BY RT-PCR (RSV, FLU A&B, COVID)  RVPGX2
Influenza A by PCR: NEGATIVE
Influenza B by PCR: NEGATIVE
Resp Syncytial Virus by PCR: NEGATIVE
SARS Coronavirus 2 by RT PCR: NEGATIVE

## 2024-06-01 LAB — I-STAT CG4 LACTIC ACID, ED: Lactic Acid, Venous: 1.9 mmol/L (ref 0.5–1.9)

## 2024-06-01 LAB — MAGNESIUM: Magnesium: 2.1 mg/dL (ref 1.7–2.4)

## 2024-06-01 MED ORDER — IOHEXOL 300 MG/ML  SOLN
100.0000 mL | Freq: Once | INTRAMUSCULAR | Status: AC | PRN
  Administered 2024-06-01: 100 mL via INTRAVENOUS

## 2024-06-01 MED ORDER — LACTATED RINGERS IV BOLUS (SEPSIS)
1000.0000 mL | Freq: Once | INTRAVENOUS | Status: AC
  Administered 2024-06-01: 1000 mL via INTRAVENOUS

## 2024-06-01 NOTE — ED Provider Notes (Signed)
 [place] EMERGENCY DEPARTMENT AT [facility] Provider Note   CSN: [id number] Arrival date & time: 05/31/24  2009     Patient presents with: Abdominal Pain   Lindsay Mason is a 73 y.o. female.    Abdominal Pain Associated symptoms: fatigue and nausea   Patient presents for abdominal pain.  Medical history includes dementia, COPD, HTN, PAD, urinary retention.  She has had lower abdominal pain for the past 3 days.  She has had associated nausea, vomiting, diarrhea.  Family noted confusion today.  She seemed to be hallucinating intermittently, which is not normal for her.  Family took her to urgent care where urinalysis was done.  They were told that urinalysis did not show infection.  She send to the ED evaluation.  Patient currently states that her abdominal pain has resolved.     Prior to Admission medications   Medication Sig Start Date End Date Taking? Authorizing Provider  albuterol  (VENTOLIN  HFA) 108 (90 Base) MCG/ACT inhaler Inhale 2 puffs into the lungs every 6 (six) hours as needed for wheezing or shortness of breath. 08/15/22   Brenna Cain L, DO  b complex vitamins capsule Take 1 capsule by mouth daily.    [provider]  famotidine  (PEPCID ) 20 MG tablet Take 1 tablet (20 mg total) by mouth daily as needed for heartburn or indigestion. 12/05/23   [name], MD  Multiple Vitamin (MULTIVITAMIN) tablet Take 1 tablet by mouth daily.    [provider]  nortriptyline  (PAMELOR ) 25 MG capsule Take 1 capsule (25 mg total) by mouth at bedtime. 05/07/24   [name], MD  rivastigmine  (EXELON ) 9.5 mg/24hr Place 1 patch (9.5 mg total) onto the skin daily. 12/26/23   [name], MD    Allergies: Penicillins    Review of Systems  Constitutional:  Positive for fatigue.  Gastrointestinal:  Positive for abdominal pain and nausea.  Psychiatric/Behavioral:  Positive for confusion.     Updated Vital Signs BP 134/74   Pulse 80   Temp  (!) 97.5 F (36.4 C)   Resp 20   Ht 5' 2 (1.575 m)   Wt 61.3 kg   SpO2 99%   BMI 24.72 kg/m   Physical Exam Vitals and nursing note reviewed.  Constitutional:      General: She is not in acute distress.    Appearance: She is well-developed. She is not ill-appearing, toxic-appearing or diaphoretic.  HENT:     Head: Normocephalic and atraumatic.     Mouth/Throat:     Mouth: Mucous membranes are moist.  Eyes:     Extraocular Movements: Extraocular movements intact.     Conjunctiva/sclera: Conjunctivae normal.  Cardiovascular:     Rate and Rhythm: Normal rate and regular rhythm.     Heart sounds: No murmur heard. Pulmonary:     Effort: Pulmonary effort is normal. No respiratory distress.  Abdominal:     Palpations: Abdomen is soft.     Tenderness: There is abdominal tenderness in the suprapubic area. There is no guarding or rebound.  Musculoskeletal:        General: No swelling.     Cervical back: Neck supple.  Skin:    General: Skin is warm and dry.  Neurological:     General: No focal deficit present.     Mental Status: She is alert and oriented to person, place, and time.  Psychiatric:        Mood and Affect: Mood normal.  Behavior: Behavior normal.     (all labs ordered are listed, but only abnormal results are displayed) Labs Reviewed  COMPREHENSIVE METABOLIC PANEL WITH GFR - Abnormal; Notable for the following components:      Result Value   Glucose, Bld 150 (*)    BUN 26 (*)    All other components within normal limits  CBC - Abnormal; Notable for the following components:   WBC 20.2 (*)    All other components within normal limits  RESP PANEL BY RT-PCR (RSV, FLU A&B, COVID)  RVPGX2  CULTURE, BLOOD (ROUTINE X 2)  CULTURE, BLOOD (ROUTINE X 2)  LIPASE, BLOOD  URINALYSIS, W/ REFLEX TO CULTURE (INFECTION SUSPECTED)  MAGNESIUM  I-STAT CG4 LACTIC ACID, ED  I-STAT CG4 LACTIC ACID, ED    EKG: EKG Interpretation Date/Time:  Tuesday June 01 2024  02:45:36 EDT Ventricular Rate:  81 PR Interval:  185 QRS Duration:  97 QT Interval:  396 QTC Calculation: 460 R Axis:   5  Text Interpretation: Sinus rhythm Posterior infarct, old Confirmed by Melvenia Motto [id number]) on 06/01/2024 4:03:49 AM  Radiology: CT ABDOMEN PELVIS W CONTRAST Result Date: 06/01/2024 CLINICAL DATA:  Abdominal pain EXAM: CT ABDOMEN AND PELVIS WITH CONTRAST TECHNIQUE: Multidetector CT imaging of the abdomen and pelvis was performed using the standard protocol following bolus administration of intravenous contrast. RADIATION DOSE REDUCTION: This exam was performed according to the departmental dose-optimization program which includes automated exposure control, adjustment of the mA and/or kV according to patient size and/or use of iterative reconstruction technique. CONTRAST:  OMNIPAQUE  IOHEXOL  300 MG/ML  SOLN COMPARISON:  01/09/2024 FINDINGS: Lower chest: No acute findings Hepatobiliary: Stable 9 mm low-density lesion in the right hepatic lobe peripherally, likely cyst. No suspicious focal hepatic abnormality. Gallbladder unremarkable. Pancreas: No focal abnormality or ductal dilatation. Spleen: No focal abnormality.  Normal size. Adrenals/Urinary Tract: No adrenal nodules. Diffuse enlargement of the left adrenal gland, likely hyperplasia, stable. 1.6 cm low-density lesion in the lower pole of the left kidney is stable and most compatible with cysts. No follow-up imaging recommended. No hydronephrosis. Urinary bladder unremarkable. Stomach/Bowel: Colonic diverticulosis. No active diverticulitis. Stomach and small bowel decompressed. No bowel obstruction or inflammatory process. Vascular/Lymphatic: Aortic atherosclerosis. No evidence of aneurysm or adenopathy. Reproductive: Again noted are 2 adjacent right adnexal cystic structures, the largest measuring 3.2 cm, not significantly changed since prior study or since PET CT 01/03/2020. No left adnexal mass. Other: No free fluid or free  air. Musculoskeletal: Scoliosis and advanced degenerative changes. No acute bony abnormality. IMPRESSION: No acute findings in the abdomen or pelvis. Scattered colonic diverticulosis.  No active diverticulitis. Aortic atherosclerosis. Stable cystic areas within the right adnexa. This is stable dating back to 2021. No specific follow-up recommended. Electronically Signed   By: Franky Crease M.D.   On: 06/01/2024 01:40   DG Chest 2 View Result Date: 05/31/2024 CLINICAL DATA:  Altered mental status leukocytosis EXAM: CHEST - 2 VIEW COMPARISON:  01/15/2024 FINDINGS: No acute airspace disease or effusion. Stable cardiomediastinal silhouette with aortic atherosclerosis. No pneumothorax IMPRESSION: No active cardiopulmonary disease. Electronically Signed   By: Luke Bun M.D.   On: 05/31/2024 21:50     Procedures   Medications Ordered in the ED  iohexol  (OMNIPAQUE ) 300 MG/ML solution 100 mL (100 mLs Intravenous Contrast Given 06/01/24 0056)  lactated ringers  bolus 1,000 mL (0 mLs Intravenous Stopped 06/01/24 0335)  Medical Decision Making Amount and/or Complexity of Data Reviewed Labs: ordered.   This patient presents to the ED for concern of abdominal pain, this involves an extensive number of treatment options, and is a complaint that carries with it a high risk of complications and morbidity.  The differential diagnosis includes constipation, colitis, bowel ischemia, UTI, nephrolithiasis, neoplasm   Co morbidities / Chronic conditions that complicate the patient evaluation  Dementia, COPD, HTN, PAD, urinary retention   Additional history obtained:  Additional history obtained from EMR External records from outside source obtained and reviewed including N/A   Lab Tests:  I Ordered, and personally interpreted labs.  The pertinent results include: A leukocytosis is present.  Lab work is otherwise unremarkable.   Imaging Studies ordered:  I ordered  imaging studies including chest x-ray, CT of abdomen and pelvis I independently visualized and interpreted imaging which showed no acute findings I agree with the radiologist interpretation   Cardiac Monitoring: / EKG:  The patient was maintained on a cardiac monitor.  I personally viewed and interpreted the cardiac monitored which showed an underlying rhythm of: Sinus rhythm   Problem List / ED Course / Critical interventions / Medication management  Patient presenting for 3 days of lower abdominal pain, nausea, delirium.  On arrival in the ED, patient has a low-grade fever of 100.9 degrees.  Vital signs are otherwise normal.  On exam, she is well-appearing.  She denies any current abdominal pain.  When palpating her lower abdomen, she does endorse a mild tenderness.  Patient's lab work is notable for leukocytosis.  She underwent chest x-ray and CT of abdomen and pelvis which did not show any acute findings.  Although dipstick urinalysis was reportedly negative at urgent care, will repeat urinalysis here.  Patient's son, who comities here in the ED, was able to help her to the bathroom for trial of urination.  Patient states that she urinated but was not able to catch it.  She was given IV fluids and ultimately underwent In-N-Out catheter.  Her urinalysis did not show infection.  Patient remained afebrile with normal vital signs.  An attempt was made to do pelvic exam.  Patient refused.  On exam of the external area, there were no abnormal findings.  Patient's pain while in the ED seems to migrate.  Son states that he has noticed the same at home.  I had a shared decision making with her son who does feel comfortable taking her home.  Patient to be treated with supportive care for what may be a viral illness.  Son was advised to return if her symptoms worsen.  Patient was discharged in stable condition. I ordered medication including IV fluids for hydration Reevaluation of the patient after these  medicines showed that the patient improved I have reviewed the patients home medicines and have made adjustments as needed   Social Determinants of Health:  Has dementia, lives at home with family     Final diagnoses:  Lower abdominal pain  Fever in adult    ED Discharge Orders     None          Melvenia Motto, MD 06/01/24 [phone]

## 2024-06-01 NOTE — ED Notes (Signed)
 Bladder scan was .

## 2024-06-01 NOTE — Discharge Instructions (Signed)
 We were not able to find any source of infection.  The imaging did not show any abnormal findings in the abdomen to explain her recent pain.  You may have a viral syndrome.  Treatment for this is ibuprofen, Tylenol , hydration, and time.  If you do feel like you are having worsening symptoms, please return to the emergency department.

## 2024-06-01 NOTE — ED Notes (Signed)
 AVS provided to and discussed with patient and family member at bedside. Pt's son verbalizes understanding of discharge instructions and denies any questions or concerns at this time. Pt taken out of department via W/C.

## 2024-06-02 ENCOUNTER — Inpatient Hospital Stay (HOSPITAL_COMMUNITY)
Admission: EM | Admit: 2024-06-02 | Discharge: 2024-07-08 | DRG: 853 | Disposition: A | Discharge status: Hospice | Attending: Internal Medicine | Admitting: Internal Medicine

## 2024-06-02 ENCOUNTER — Inpatient Hospital Stay (HOSPITAL_COMMUNITY)

## 2024-06-02 ENCOUNTER — Other Ambulatory Visit: Payer: Self-pay

## 2024-06-02 ENCOUNTER — Encounter (HOSPITAL_COMMUNITY): Payer: Self-pay | Admitting: *Deleted

## 2024-06-02 DIAGNOSIS — I959 Hypotension, unspecified: Secondary | ICD-10-CM | POA: Diagnosis not present

## 2024-06-02 DIAGNOSIS — N83201 Unspecified ovarian cyst, right side: Secondary | ICD-10-CM | POA: Diagnosis present

## 2024-06-02 DIAGNOSIS — E785 Hyperlipidemia, unspecified: Secondary | ICD-10-CM | POA: Diagnosis not present

## 2024-06-02 DIAGNOSIS — I10 Essential (primary) hypertension: Secondary | ICD-10-CM | POA: Diagnosis not present

## 2024-06-02 DIAGNOSIS — R140 Abdominal distension (gaseous): Secondary | ICD-10-CM | POA: Diagnosis not present

## 2024-06-02 DIAGNOSIS — R188 Other ascites: Secondary | ICD-10-CM | POA: Diagnosis not present

## 2024-06-02 DIAGNOSIS — F17290 Nicotine dependence, other tobacco product, uncomplicated: Secondary | ICD-10-CM | POA: Diagnosis present

## 2024-06-02 DIAGNOSIS — Z7189 Other specified counseling: Secondary | ICD-10-CM

## 2024-06-02 DIAGNOSIS — N281 Cyst of kidney, acquired: Secondary | ICD-10-CM | POA: Diagnosis not present

## 2024-06-02 DIAGNOSIS — Z87891 Personal history of nicotine dependence: Secondary | ICD-10-CM | POA: Diagnosis not present

## 2024-06-02 DIAGNOSIS — R531 Weakness: Secondary | ICD-10-CM | POA: Diagnosis not present

## 2024-06-02 DIAGNOSIS — F02818 Dementia in other diseases classified elsewhere, unspecified severity, with other behavioral disturbance: Secondary | ICD-10-CM | POA: Diagnosis not present

## 2024-06-02 DIAGNOSIS — Z801 Family history of malignant neoplasm of trachea, bronchus and lung: Secondary | ICD-10-CM

## 2024-06-02 DIAGNOSIS — Z888 Allergy status to other drugs, medicaments and biological substances status: Secondary | ICD-10-CM

## 2024-06-02 DIAGNOSIS — R109 Unspecified abdominal pain: Secondary | ICD-10-CM | POA: Diagnosis present

## 2024-06-02 DIAGNOSIS — Z1152 Encounter for screening for COVID-19: Secondary | ICD-10-CM

## 2024-06-02 DIAGNOSIS — K660 Peritoneal adhesions (postprocedural) (postinfection): Secondary | ICD-10-CM | POA: Diagnosis not present

## 2024-06-02 DIAGNOSIS — K5792 Diverticulitis of intestine, part unspecified, without perforation or abscess without bleeding: Principal | ICD-10-CM

## 2024-06-02 DIAGNOSIS — I714 Abdominal aortic aneurysm, without rupture, unspecified: Secondary | ICD-10-CM | POA: Diagnosis present

## 2024-06-02 DIAGNOSIS — Z8262 Family history of osteoporosis: Secondary | ICD-10-CM

## 2024-06-02 DIAGNOSIS — G9341 Metabolic encephalopathy: Secondary | ICD-10-CM | POA: Diagnosis present

## 2024-06-02 DIAGNOSIS — Z95828 Presence of other vascular implants and grafts: Secondary | ICD-10-CM

## 2024-06-02 DIAGNOSIS — R1310 Dysphagia, unspecified: Secondary | ICD-10-CM | POA: Diagnosis not present

## 2024-06-02 DIAGNOSIS — F05 Delirium due to known physiological condition: Secondary | ICD-10-CM | POA: Diagnosis not present

## 2024-06-02 DIAGNOSIS — F02C0 Dementia in other diseases classified elsewhere, severe, without behavioral disturbance, psychotic disturbance, mood disturbance, and anxiety: Secondary | ICD-10-CM | POA: Diagnosis not present

## 2024-06-02 DIAGNOSIS — Z66 Do not resuscitate: Secondary | ICD-10-CM | POA: Diagnosis not present

## 2024-06-02 DIAGNOSIS — J438 Other emphysema: Secondary | ICD-10-CM | POA: Diagnosis not present

## 2024-06-02 DIAGNOSIS — R9431 Abnormal electrocardiogram [ECG] [EKG]: Secondary | ICD-10-CM | POA: Diagnosis present

## 2024-06-02 DIAGNOSIS — Z4682 Encounter for fitting and adjustment of non-vascular catheter: Secondary | ICD-10-CM | POA: Diagnosis not present

## 2024-06-02 DIAGNOSIS — K769 Liver disease, unspecified: Secondary | ICD-10-CM | POA: Diagnosis not present

## 2024-06-02 DIAGNOSIS — K529 Noninfective gastroenteritis and colitis, unspecified: Secondary | ICD-10-CM | POA: Diagnosis not present

## 2024-06-02 DIAGNOSIS — T81328A Disruption or dehiscence of closure of other specified internal operation (surgical) wound, initial encounter: Secondary | ICD-10-CM | POA: Diagnosis not present

## 2024-06-02 DIAGNOSIS — Z781 Physical restraint status: Secondary | ICD-10-CM

## 2024-06-02 DIAGNOSIS — K5730 Diverticulosis of large intestine without perforation or abscess without bleeding: Secondary | ICD-10-CM | POA: Diagnosis present

## 2024-06-02 DIAGNOSIS — K5900 Constipation, unspecified: Secondary | ICD-10-CM | POA: Diagnosis not present

## 2024-06-02 DIAGNOSIS — Z823 Family history of stroke: Secondary | ICD-10-CM

## 2024-06-02 DIAGNOSIS — R410 Disorientation, unspecified: Secondary | ICD-10-CM | POA: Diagnosis present

## 2024-06-02 DIAGNOSIS — R7989 Other specified abnormal findings of blood chemistry: Secondary | ICD-10-CM | POA: Diagnosis not present

## 2024-06-02 DIAGNOSIS — K651 Peritoneal abscess: Secondary | ICD-10-CM | POA: Diagnosis not present

## 2024-06-02 DIAGNOSIS — J439 Emphysema, unspecified: Secondary | ICD-10-CM | POA: Diagnosis not present

## 2024-06-02 DIAGNOSIS — Z79899 Other long term (current) drug therapy: Secondary | ICD-10-CM

## 2024-06-02 DIAGNOSIS — I700 Atherosclerosis of aorta: Secondary | ICD-10-CM | POA: Diagnosis present

## 2024-06-02 DIAGNOSIS — K5700 Diverticulitis of small intestine with perforation and abscess without bleeding: Secondary | ICD-10-CM | POA: Diagnosis not present

## 2024-06-02 DIAGNOSIS — Z8679 Personal history of other diseases of the circulatory system: Secondary | ICD-10-CM

## 2024-06-02 DIAGNOSIS — A419 Sepsis, unspecified organism: Principal | ICD-10-CM | POA: Diagnosis present

## 2024-06-02 DIAGNOSIS — R799 Abnormal finding of blood chemistry, unspecified: Secondary | ICD-10-CM | POA: Diagnosis present

## 2024-06-02 DIAGNOSIS — K56609 Unspecified intestinal obstruction, unspecified as to partial versus complete obstruction: Secondary | ICD-10-CM | POA: Diagnosis not present

## 2024-06-02 DIAGNOSIS — R112 Nausea with vomiting, unspecified: Secondary | ICD-10-CM | POA: Diagnosis present

## 2024-06-02 DIAGNOSIS — K5669 Other partial intestinal obstruction: Secondary | ICD-10-CM | POA: Diagnosis not present

## 2024-06-02 DIAGNOSIS — F02C18 Dementia in other diseases classified elsewhere, severe, with other behavioral disturbance: Secondary | ICD-10-CM | POA: Diagnosis not present

## 2024-06-02 DIAGNOSIS — G300 Alzheimer's disease with early onset: Secondary | ICD-10-CM | POA: Diagnosis not present

## 2024-06-02 DIAGNOSIS — Z9049 Acquired absence of other specified parts of digestive tract: Secondary | ICD-10-CM | POA: Diagnosis not present

## 2024-06-02 DIAGNOSIS — K658 Other peritonitis: Secondary | ICD-10-CM | POA: Diagnosis not present

## 2024-06-02 DIAGNOSIS — E876 Hypokalemia: Secondary | ICD-10-CM | POA: Diagnosis not present

## 2024-06-02 DIAGNOSIS — Z6824 Body mass index (BMI) 24.0-24.9, adult: Secondary | ICD-10-CM

## 2024-06-02 DIAGNOSIS — K30 Functional dyspepsia: Secondary | ICD-10-CM | POA: Diagnosis not present

## 2024-06-02 DIAGNOSIS — K6389 Other specified diseases of intestine: Secondary | ICD-10-CM | POA: Diagnosis not present

## 2024-06-02 DIAGNOSIS — Z515 Encounter for palliative care: Secondary | ICD-10-CM

## 2024-06-02 DIAGNOSIS — D638 Anemia in other chronic diseases classified elsewhere: Secondary | ICD-10-CM | POA: Diagnosis present

## 2024-06-02 DIAGNOSIS — K56600 Partial intestinal obstruction, unspecified as to cause: Secondary | ICD-10-CM | POA: Diagnosis not present

## 2024-06-02 DIAGNOSIS — I739 Peripheral vascular disease, unspecified: Secondary | ICD-10-CM | POA: Diagnosis not present

## 2024-06-02 DIAGNOSIS — K55019 Acute (reversible) ischemia of small intestine, extent unspecified: Secondary | ICD-10-CM | POA: Diagnosis present

## 2024-06-02 DIAGNOSIS — Z87440 Personal history of urinary (tract) infections: Secondary | ICD-10-CM

## 2024-06-02 DIAGNOSIS — E440 Moderate protein-calorie malnutrition: Secondary | ICD-10-CM | POA: Diagnosis present

## 2024-06-02 DIAGNOSIS — Z833 Family history of diabetes mellitus: Secondary | ICD-10-CM

## 2024-06-02 DIAGNOSIS — M419 Scoliosis, unspecified: Secondary | ICD-10-CM | POA: Diagnosis present

## 2024-06-02 DIAGNOSIS — Z902 Acquired absence of lung [part of]: Secondary | ICD-10-CM

## 2024-06-02 DIAGNOSIS — K567 Ileus, unspecified: Secondary | ICD-10-CM | POA: Diagnosis not present

## 2024-06-02 DIAGNOSIS — R6510 Systemic inflammatory response syndrome (SIRS) of non-infectious origin without acute organ dysfunction: Secondary | ICD-10-CM | POA: Diagnosis present

## 2024-06-02 DIAGNOSIS — Z85118 Personal history of other malignant neoplasm of bronchus and lung: Secondary | ICD-10-CM

## 2024-06-02 DIAGNOSIS — R7881 Bacteremia: Secondary | ICD-10-CM | POA: Diagnosis present

## 2024-06-02 DIAGNOSIS — C3431 Malignant neoplasm of lower lobe, right bronchus or lung: Secondary | ICD-10-CM | POA: Diagnosis not present

## 2024-06-02 DIAGNOSIS — M810 Age-related osteoporosis without current pathological fracture: Secondary | ICD-10-CM | POA: Diagnosis present

## 2024-06-02 DIAGNOSIS — E8721 Acute metabolic acidosis: Secondary | ICD-10-CM | POA: Diagnosis not present

## 2024-06-02 DIAGNOSIS — J449 Chronic obstructive pulmonary disease, unspecified: Secondary | ICD-10-CM | POA: Diagnosis not present

## 2024-06-02 DIAGNOSIS — R932 Abnormal findings on diagnostic imaging of liver and biliary tract: Secondary | ICD-10-CM | POA: Diagnosis not present

## 2024-06-02 DIAGNOSIS — Z880 Allergy status to penicillin: Secondary | ICD-10-CM

## 2024-06-02 DIAGNOSIS — Z8249 Family history of ischemic heart disease and other diseases of the circulatory system: Secondary | ICD-10-CM

## 2024-06-02 LAB — CBC WITH DIFFERENTIAL/PLATELET
Abs Immature Granulocytes: 0.06 K/uL (ref 0.00–0.07)
Basophils Absolute: 0 K/uL (ref 0.0–0.1)
Basophils Relative: 0 %
Eosinophils Absolute: 0 K/uL (ref 0.0–0.5)
Eosinophils Relative: 0 %
HCT: 32.6 % — ABNORMAL LOW (ref 36.0–46.0)
Hemoglobin: 10.5 g/dL — ABNORMAL LOW (ref 12.0–15.0)
Immature Granulocytes: 0 %
Lymphocytes Relative: 7 %
Lymphs Abs: 0.9 K/uL (ref 0.7–4.0)
MCH: 29.5 pg (ref 26.0–34.0)
MCHC: 32.2 g/dL (ref 30.0–36.0)
MCV: 91.6 fL (ref 80.0–100.0)
Monocytes Absolute: 0.7 K/uL (ref 0.1–1.0)
Monocytes Relative: 5 %
Neutro Abs: 11.9 K/uL — ABNORMAL HIGH (ref 1.7–7.7)
Neutrophils Relative %: 88 %
Platelets: 188 K/uL (ref 150–400)
RBC: 3.56 MIL/uL — ABNORMAL LOW (ref 3.87–5.11)
RDW: 14.8 % (ref 11.5–15.5)
WBC: 13.7 K/uL — ABNORMAL HIGH (ref 4.0–10.5)
nRBC: 0 % (ref 0.0–0.2)

## 2024-06-02 LAB — URINALYSIS, W/ REFLEX TO CULTURE (INFECTION SUSPECTED)
Bacteria, UA: NONE SEEN
Bilirubin Urine: NEGATIVE
Glucose, UA: NEGATIVE mg/dL
Hgb urine dipstick: NEGATIVE
Ketones, ur: NEGATIVE mg/dL
Leukocytes,Ua: NEGATIVE
Nitrite: NEGATIVE
Protein, ur: NEGATIVE mg/dL
Specific Gravity, Urine: >1.046 — ABNORMAL HIGH (ref 1.005–1.030)
pH: 5 (ref 5.0–8.0)

## 2024-06-02 LAB — ECHOCARDIOGRAM COMPLETE
Area-P 1/2: 6.9 cm2
Calc EF: 51.6 %
Height: 62 in
S' Lateral: 2.9 cm
Single Plane A2C EF: 48.7 %
Single Plane A4C EF: 55 %
Weight: 2162.27 [oz_av]

## 2024-06-02 LAB — LACTIC ACID, PLASMA: Lactic Acid, Venous: 1.1 mmol/L (ref 0.5–1.9)

## 2024-06-02 LAB — BLOOD CULTURE ID PANEL (REFLEXED) - BCID2

## 2024-06-02 LAB — COMPREHENSIVE METABOLIC PANEL WITH GFR
ALT: 21 U/L (ref 0–44)
AST: 26 U/L (ref 15–41)
Albumin: 3.4 g/dL — ABNORMAL LOW (ref 3.5–5.0)
Alkaline Phosphatase: 76 U/L (ref 38–126)
Anion gap: 9 (ref 5–15)
BUN: 26 mg/dL — ABNORMAL HIGH (ref 8–23)
CO2: 24 mmol/L (ref 22–32)
Calcium: 9.4 mg/dL (ref 8.9–10.3)
Chloride: 103 mmol/L (ref 98–111)
Creatinine, Ser: 0.88 mg/dL (ref 0.44–1.00)
GFR, Estimated: >60 mL/min (ref >60)
Glucose, Bld: 96 mg/dL (ref 70–99)
Potassium: 4.1 mmol/L (ref 3.5–5.1)
Sodium: 136 mmol/L (ref 135–145)
Total Bilirubin: 1.4 mg/dL — ABNORMAL HIGH (ref 0.0–1.2)
Total Protein: 6.3 g/dL — ABNORMAL LOW (ref 6.5–8.1)

## 2024-06-02 LAB — RESP PANEL BY RT-PCR (RSV, FLU A&B, COVID)  RVPGX2
Influenza A by PCR: NEGATIVE
Influenza B by PCR: NEGATIVE
Resp Syncytial Virus by PCR: NEGATIVE
SARS Coronavirus 2 by RT PCR: NEGATIVE

## 2024-06-02 MED ORDER — ACETAMINOPHEN 325 MG PO TABS
650.0000 mg | ORAL_TABLET | Freq: Four times a day (QID) | ORAL | Status: DC | PRN
  Administered 2024-06-02 – 2024-06-07 (×11): 650 mg via ORAL
  Filled 2024-06-02 (×13): qty 2

## 2024-06-02 MED ORDER — SODIUM CHLORIDE 0.9 % IV SOLN
2.0000 g | Freq: Two times a day (BID) | INTRAVENOUS | Status: AC
  Administered 2024-06-03 – 2024-06-08 (×12): 2 g via INTRAVENOUS
  Filled 2024-06-02 (×13): qty 12.5

## 2024-06-02 MED ORDER — ALBUTEROL SULFATE HFA 108 (90 BASE) MCG/ACT IN AERS: 2.0000 | INHALATION_SPRAY | Freq: Four times a day (QID) | RESPIRATORY_TRACT | Status: DC | PRN

## 2024-06-02 MED ORDER — METRONIDAZOLE 500 MG/100ML IV SOLN
500.0000 mg | Freq: Two times a day (BID) | INTRAVENOUS | Status: DC
  Administered 2024-06-02 – 2024-06-09 (×13): 500 mg via INTRAVENOUS
  Filled 2024-06-02 (×14): qty 100

## 2024-06-02 MED ORDER — ACETAMINOPHEN 650 MG RE SUPP
650.0000 mg | Freq: Four times a day (QID) | RECTAL | Status: DC | PRN
  Filled 2024-06-02: qty 1

## 2024-06-02 MED ORDER — METRONIDAZOLE 500 MG/100ML IV SOLN
500.0000 mg | Freq: Once | INTRAVENOUS | Status: AC
  Administered 2024-06-02: 500 mg via INTRAVENOUS
  Filled 2024-06-02: qty 100

## 2024-06-02 MED ORDER — ALBUTEROL SULFATE (2.5 MG/3ML) 0.083% IN NEBU
2.5000 mg | INHALATION_SOLUTION | Freq: Four times a day (QID) | RESPIRATORY_TRACT | Status: DC | PRN
  Administered 2024-06-04: 2.5 mg via RESPIRATORY_TRACT
  Filled 2024-06-02: qty 3

## 2024-06-02 MED ORDER — VANCOMYCIN HCL IN DEXTROSE 1-5 GM/200ML-% IV SOLN: 1000.0000 mg | INTRAVENOUS | Status: DC

## 2024-06-02 MED ORDER — RIVASTIGMINE 9.5 MG/24HR TD PT24
9.5000 mg | MEDICATED_PATCH | Freq: Every day | TRANSDERMAL | Status: DC
  Administered 2024-06-02 – 2024-07-08 (×37): 9.5 mg via TRANSDERMAL
  Filled 2024-06-02 (×37): qty 1

## 2024-06-02 MED ORDER — LACTATED RINGERS IV BOLUS: 1000.0000 mL | Freq: Once | INTRAVENOUS | Status: DC

## 2024-06-02 MED ORDER — NORTRIPTYLINE HCL 25 MG PO CAPS
25.0000 mg | ORAL_CAPSULE | Freq: Every day | ORAL | Status: DC
  Administered 2024-06-02 – 2024-06-07 (×5): 25 mg via ORAL
  Filled 2024-06-02 (×7): qty 1

## 2024-06-02 MED ORDER — TRAZODONE HCL 50 MG PO TABS
50.0000 mg | ORAL_TABLET | Freq: Every evening | ORAL | Status: DC | PRN
  Administered 2024-06-04: 50 mg via ORAL
  Filled 2024-06-02: qty 1

## 2024-06-02 MED ORDER — METOCLOPRAMIDE HCL 5 MG/ML IJ SOLN
5.0000 mg | Freq: Once | INTRAMUSCULAR | Status: AC
  Administered 2024-06-02: 5 mg via INTRAVENOUS
  Filled 2024-06-02: qty 2

## 2024-06-02 MED ORDER — ACETAMINOPHEN 500 MG PO TABS
1000.0000 mg | ORAL_TABLET | Freq: Once | ORAL | Status: AC
  Administered 2024-06-02: 1000 mg via ORAL
  Filled 2024-06-02: qty 2

## 2024-06-02 MED ORDER — ONDANSETRON HCL 4 MG PO TABS: 4.0000 mg | ORAL_TABLET | Freq: Four times a day (QID) | ORAL | Status: DC | PRN

## 2024-06-02 MED ORDER — ONDANSETRON HCL 4 MG/2ML IJ SOLN
4.0000 mg | Freq: Four times a day (QID) | INTRAMUSCULAR | Status: DC | PRN
  Administered 2024-06-03 – 2024-07-03 (×11): 4 mg via INTRAVENOUS
  Filled 2024-06-02 (×10): qty 2

## 2024-06-02 MED ORDER — ATORVASTATIN CALCIUM 10 MG PO TABS
20.0000 mg | ORAL_TABLET | Freq: Every day | ORAL | Status: DC
  Administered 2024-06-02 – 2024-06-07 (×6): 20 mg via ORAL
  Filled 2024-06-02 (×6): qty 2

## 2024-06-02 MED ORDER — BOOST / RESOURCE BREEZE PO LIQD CUSTOM
1.0000 | Freq: Three times a day (TID) | ORAL | Status: DC
  Administered 2024-06-03 – 2024-06-04 (×6): 1 via ORAL

## 2024-06-02 MED ORDER — SODIUM CHLORIDE 0.9 % IV SOLN: INTRAVENOUS | Status: AC

## 2024-06-02 MED ORDER — LACTATED RINGERS IV BOLUS (SEPSIS)
1000.0000 mL | Freq: Once | INTRAVENOUS | Status: AC
  Administered 2024-06-02: 1000 mL via INTRAVENOUS

## 2024-06-02 MED ORDER — VANCOMYCIN HCL IN DEXTROSE 1-5 GM/200ML-% IV SOLN
1000.0000 mg | Freq: Once | INTRAVENOUS | Status: AC
  Administered 2024-06-02: 1000 mg via INTRAVENOUS
  Filled 2024-06-02: qty 200

## 2024-06-02 MED ORDER — SODIUM CHLORIDE 0.9 % IV SOLN
2.0000 g | Freq: Once | INTRAVENOUS | Status: AC
  Administered 2024-06-02: 2 g via INTRAVENOUS
  Filled 2024-06-02: qty 12.5

## 2024-06-02 MED ORDER — FAMOTIDINE 20 MG PO TABS
20.0000 mg | ORAL_TABLET | Freq: Every day | ORAL | Status: DC | PRN
  Filled 2024-06-02: qty 1

## 2024-06-02 NOTE — ED Notes (Signed)
 Difficult stick. Pt has been stuck multiple times and IV team was able to obtain access

## 2024-06-02 NOTE — ED Triage Notes (Signed)
 Pt returned to follow up with (+) Blood cultures, staff called earlier and ask her to return. Family with pt answering questions due to her dementia.

## 2024-06-02 NOTE — Progress Notes (Signed)
 Pharmacy Antibiotic Note  Lindsay Mason is a 73 y.o. female previously admitted on 05/31/2024 for 3 day history of lower abdominal pain, nausea, and delirium. Recent E.faecalis/Staph simulans UTI in 01/2024. Patient now returning on 06/02/2024 after call back for blood cultures now positive for GP cocci in clusters. Unknown etiology. Pharmacy has been consulted to for cefepime  and vancomycin  dosing for 7 days. One time doses ordered in ED.   Tmax 100.9 8/18 >> afebrile, mildly elevated WBC  Plan: Vancomycin  1000mg  IV q24H (eAUC 542, Scr 0.88, Vd 0.72, IBW) Cefepime  2g IV q12h  Monitor clinical status, fever curve and renal function  Follow up cultures, ability to de-escalate  Height: 5' 2 (157.5 cm) Weight: 61.3 kg (135 lb 2.3 oz) IBW/kg (Calculated) : 50.1  Temp (24hrs), Avg:98.6 F (37 C), Min:98.6 F (37 C), Max:98.6 F (37 C)  Recent Labs  Lab 05/31/24 2054 06/01/24 0245 06/02/24 0908 06/02/24 0910 06/02/24 1014  WBC 20.2*  --   --   --  13.7*  CREATININE 0.67  --  0.88  --   --   LATICACIDVEN  --  1.9  --  1.1  --     Estimated Creatinine Clearance: 49.1 mL/min (by C-G formula based on SCr of 0.88 mg/dL).    Allergies  Allergen Reactions   Donepezil  Nausea And Vomiting   Penicillins Swelling    REACTION: swelling tongue - States has taken Cephalosporins without difficulty in past    Antimicrobials this admission: Vanc 8/20 >> 8/26 Cefepime  8/20 >>8/26 Flagyl  8/20>>8/27   Dose adjustments this admission: None  Microbiology results: 8/19 BCx: GP cocci in clusters   Thank you for allowing pharmacy to be a part of this patient's care.  Sherol Sabas 06/02/2024 11:33 AM

## 2024-06-02 NOTE — ED Notes (Signed)
 Attempted to call pt to have her come back to ED. No answer. Will try again in am

## 2024-06-02 NOTE — ED Notes (Signed)
 This RN spoke w/ pt- reports they will be heading back to hospital

## 2024-06-02 NOTE — ED Provider Notes (Signed)
 [place] EMERGENCY DEPARTMENT AT [facility] Provider Note   CSN: [id number] Arrival date & time: 06/02/24  9246     Patient presents with: Blood Cultures (+)   Lindsay Mason is a 73 y.o. female.   HPI     73 year old patient comes in with chief complaint of abnormal blood labs.  Patient has history of COPD, dementia and complex UTI.  She is accompanied here by her family members.  They indicate that patient started complaining of left-sided abdominal discomfort at home with some nausea, vomiting and diarrhea.  They had gone to urgent care, UA was normal.  Sent to the ER, had CT scan that was normal.  Patient received a phone call from hospital about abnormal blood culture and has returned.  According to the family, diarrhea has resolved, however patient has had reduced p.o. intake, continues to have worsening confusion.  Review of system is positive for subjective fevers.  No vomiting, diarrhea.  Prior to Admission medications   Medication Sig Start Date End Date Taking? Authorizing Provider  traZODone  (DESYREL ) 50 MG tablet Take 25-50 mg by mouth at bedtime as needed for sleep. 05/20/24  Yes [provider]  albuterol  (VENTOLIN  HFA) 108 (90 Base) MCG/ACT inhaler Inhale 2 puffs into the lungs every 6 (six) hours as needed for wheezing or shortness of breath. 08/15/22   Brenna Cain L, DO  b complex vitamins capsule Take 1 capsule by mouth daily.    [provider]  famotidine  (PEPCID ) 20 MG tablet Take 1 tablet (20 mg total) by mouth daily as needed for heartburn or indigestion. 12/05/23   [name], MD  Multiple Vitamin (MULTIVITAMIN) tablet Take 1 tablet by mouth daily.    [provider]  nortriptyline  (PAMELOR ) 25 MG capsule Take 1 capsule (25 mg total) by mouth at bedtime. 05/07/24   [name], MD  rivastigmine  (EXELON ) 9.5 mg/24hr Place 1 patch (9.5 mg total) onto the skin daily. 12/26/23   [name], MD     Allergies: Penicillins    Review of Systems  All other systems reviewed and are negative.   Updated Vital Signs BP 91/79   Pulse 90   Temp 98.6 F (37 C) (Oral)   Resp (!) 25   Ht 5' 2 (1.575 m)   Wt 61.3 kg   SpO2 98%   BMI 24.72 kg/m   Physical Exam Vitals and nursing note reviewed.  Constitutional:      Appearance: She is well-developed.  HENT:     Head: Atraumatic.  Cardiovascular:     Rate and Rhythm: Normal rate.  Pulmonary:     Effort: Pulmonary effort is normal.  Abdominal:     Tenderness: There is abdominal tenderness.     Comments: Patient has left-sided abdominal tenderness with guarding  Musculoskeletal:     Cervical back: Normal range of motion and neck supple.  Skin:    General: Skin is warm and dry.  Neurological:     Mental Status: She is alert and oriented to person, place, and time. Mental status is at baseline.     (all labs ordered are listed, but only abnormal results are displayed) Labs Reviewed  URINALYSIS, W/ REFLEX TO CULTURE (INFECTION SUSPECTED) - Abnormal; Notable for the following components:      Result Value   Specific Gravity, Urine >1.046 (*)    All other components within normal limits  CBC WITH DIFFERENTIAL/PLATELET - Abnormal; Notable for the following components:   WBC 13.7 (*)  RBC 3.56 (*)    Hemoglobin 10.5 (*)    HCT 32.6 (*)    Neutro Abs 11.9 (*)    All other components within normal limits  CULTURE, BLOOD (ROUTINE X 2)  CULTURE, BLOOD (ROUTINE X 2)  RESP PANEL BY RT-PCR (RSV, FLU A&B, COVID)  RVPGX2  LACTIC ACID, PLASMA  COMPREHENSIVE METABOLIC PANEL WITH GFR  CBC WITH DIFFERENTIAL/PLATELET  LACTIC ACID, PLASMA    EKG: None  Radiology: CT ABDOMEN PELVIS W CONTRAST Result Date: 06/01/2024 CLINICAL DATA:  Abdominal pain EXAM: CT ABDOMEN AND PELVIS WITH CONTRAST TECHNIQUE: Multidetector CT imaging of the abdomen and pelvis was performed using the standard protocol following bolus administration of  intravenous contrast. RADIATION DOSE REDUCTION: This exam was performed according to the departmental dose-optimization program which includes automated exposure control, adjustment of the mA and/or kV according to patient size and/or use of iterative reconstruction technique. CONTRAST:  OMNIPAQUE  IOHEXOL  300 MG/ML  SOLN COMPARISON:  01/09/2024 FINDINGS: Lower chest: No acute findings Hepatobiliary: Stable 9 mm low-density lesion in the right hepatic lobe peripherally, likely cyst. No suspicious focal hepatic abnormality. Gallbladder unremarkable. Pancreas: No focal abnormality or ductal dilatation. Spleen: No focal abnormality.  Normal size. Adrenals/Urinary Tract: No adrenal nodules. Diffuse enlargement of the left adrenal gland, likely hyperplasia, stable. 1.6 cm low-density lesion in the lower pole of the left kidney is stable and most compatible with cysts. No follow-up imaging recommended. No hydronephrosis. Urinary bladder unremarkable. Stomach/Bowel: Colonic diverticulosis. No active diverticulitis. Stomach and small bowel decompressed. No bowel obstruction or inflammatory process. Vascular/Lymphatic: Aortic atherosclerosis. No evidence of aneurysm or adenopathy. Reproductive: Again noted are 2 adjacent right adnexal cystic structures, the largest measuring 3.2 cm, not significantly changed since prior study or since PET CT 01/03/2020. No left adnexal mass. Other: No free fluid or free air. Musculoskeletal: Scoliosis and advanced degenerative changes. No acute bony abnormality. IMPRESSION: No acute findings in the abdomen or pelvis. Scattered colonic diverticulosis.  No active diverticulitis. Aortic atherosclerosis. Stable cystic areas within the right adnexa. This is stable dating back to 2021. No specific follow-up recommended. Electronically Signed   By: Franky Crease M.D.   On: 06/01/2024 01:40   DG Chest 2 View Result Date: 05/31/2024 CLINICAL DATA:  Altered mental status leukocytosis EXAM:  CHEST - 2 VIEW COMPARISON:  01/15/2024 FINDINGS: No acute airspace disease or effusion. Stable cardiomediastinal silhouette with aortic atherosclerosis. No pneumothorax IMPRESSION: No active cardiopulmonary disease. Electronically Signed   By: Luke Bun M.D.   On: 05/31/2024 21:50     Procedures   Medications Ordered in the ED  lactated ringers  bolus 1,000 mL (has no administration in time range)  0.9 %  sodium chloride  infusion (has no administration in time range)  ceFEPIme  (MAXIPIME ) 2 g in sodium chloride  0.9 % 100 mL IVPB (has no administration in time range)  vancomycin  (VANCOCIN ) IVPB 1000 mg/200 mL premix (has no administration in time range)  metroNIDAZOLE  (FLAGYL ) IVPB 500 mg (has no administration in time range)  lactated ringers  bolus 1,000 mL (1,000 mLs Intravenous New Bag/Given 06/02/24 1008)  acetaminophen  (TYLENOL ) tablet 1,000 mg (1,000 mg Oral Given 06/02/24 0909)                                    Medical Decision Making Amount and/or Complexity of Data Reviewed Labs: ordered.  Risk OTC drugs. Prescription drug management. Decision regarding hospitalization.   This patient  presents to the ED with chief complaint(s) of abnormal labs. However, pt was seen in the ER for generalized illness, confusion and weakness with n/v recently. with pertinent past medical history of dementia, PAD, COPD. The complaint involves an extensive differential diagnosis and also carries with it a high risk of complications and morbidity. Collateral history provided by patient's son  I have reviewed patient's previous records including the medications.  I reviewed patient's records including blood culture and previous labs.  The labs were reassuring including UA, lactic acid during the last encounter.  CBC was normal.  Differential diagnosis considered for this patient includes: ICH / Stroke, acute coronary syndrome, Infection - UTI/Pneumonia/soft tissue infection, diverticulitis leading  to encephalopathy, encephalopathy due to electrolyte abnormality or drug interactions or toxins or metabolic conditions like adrenal insufficiency, hyperglycemia, paraneoplastic process.  Based on initial assessment, higher suspicion for occult infection, could be viral.  Clinically, the only relevant finding was left lower quadrant tenderness.  I reviewed the CT scan, that was reassuring last time.  No perforation.  It appears that patient's mentation has been worse, she has been more sleepy and has had reduced p.o. intake.  Clinically, I think patient has diverticulitis based on the left lower quadrant tenderness.  I will give her antibiotics for likely diverticulitis.  Plan is to get basic labs and IV fluids, reassess the patient.  Repeat CT scan if needed.  Consider admission if needed.  Reassessment: I have independently reviewed the following labs: Patient's white count has improved from 20-14.  However, her blood pressure has remained in the 90s over 60s range.  She has received 1 L of IV fluid.  We will give her another fluid.  Lactic acid is 1.1.  Repeat metabolic profile is pending at this time.  Although the white count is improved, clinically she has worsened since she went home.  Blood pressure is soft.  She continues to have left lower quadrant abdominal tenderness.  I am concerned that this could be diverticulitis that just was not picked up radiographically.  I still suspect that the staff could be contaminant, but given her clinical worsening, it is probably better to cover her with antibiotics for now.  Plan is to get IV Vanco and cefepime  and started and admit the patient.  Flagyl  also added.   Final diagnoses:  Diverticulitis    ED Discharge Orders     None          Charlyn Sora, MD 06/02/24 1102

## 2024-06-02 NOTE — H&P (Signed)
 History and Physical    Patient: Lindsay Mason FMW:[id number] DOB: Oct 18, 1950 DOA: 06/02/2024 DOS: the patient was seen and examined on 06/02/2024 PCP: [name], MD  Patient coming from: Home  Chief Complaint:  Chief Complaint  Patient presents with   Blood Cultures (+)   HPI: Lindsay Mason is a 73 y.o. 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  She was initially brought on Monday (05/31/2024) evening due to a 2-year history of abdominal pain, nausea with several episodes of emesis since Friday, 05/28/2024 associated with confusion.  She is able to answer simple.  She denied any headache, chest, back or abdominal pain at this time.  No joint pain or muscle aches.  Lab work: Urinalysis showed greater than 1.046 of specific gravity but was otherwise normal.  CBC showed white count 13.7, hemoglobin 10.5 g/dL platelets 811.  Lactic acid was normal.  CMP showed a sodium of  Imaging: 2 view chest radiograph done on 06/01/2019 [place] evening with no active cardiopulmonary disease.  There was aortic atherosclerosis.  CT abdomen/pelvis done in early morning yesterday with no acute findings in the abdomen or pelvis.  There was scattered colonic diverticulosis with small diverticulitis.  Aortic atherosclerosis.  Stable cystic areas within the right adnexa, which dates back to 2021.   ED course: Initial vital  signs were temp 98.6 F, pulse 93, respiration 24, BP 95/67 mmHg O2 sat 95% on room air.  The patient received 2000 mL of normal saline bolus, acetaminophen  1000 mg p.o. x 1, also cefepime , metronidazole  and vancomycin  IVPB.  Review of Systems: As mentioned in the history of present illness. All other systems reviewed and are negative.  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   by CT - moderate centrilobular and paraseptal   Ex-smoker 08/2014   currently using e cig   Microscopic colitis 06/2015   lymphocytic by colonoscopy - started entocort [name])   Osteoporosis, unspecified    Scoliosis of lumbar spine    Thoracic aortic atherosclerosis (HCC) 02/2016   by CT   Past Surgical History:  Procedure Laterality Date   CATARACT EXTRACTION W/PHACO Right 02/21/2021   Procedure: CATARACT EXTRACTION PHACO AND INTRAOCULAR LENS PLACEMENT (IOC) RIGHT;  Surgeon: Mittie Gaskin, MD;  Location: [facility];  Service: Ophthalmology;  Laterality: Right;  cde 4.75 00:41.1 minutes 11.5%   CATARACT EXTRACTION W/PHACO Left 03/14/2021   Procedure: CATARACT EXTRACTION PHACO AND INTRAOCULAR LENS PLACEMENT (IOC) LEFT 4.07 00:44.6;  Surgeon: Mittie Gaskin, MD;  Location: [facility];  Service: Ophthalmology;  Laterality: Left;   COLONOSCOPY  06/2015   microscopic colitis [name])   DEXA  10/2008   Osteoporosis   DEXA  02/2014   T score osteopenia hip, osteoporosis spine, scoliosis   ELECTROMAGNETIC NAVIGATION BROCHOSCOPY  01/20/2020   Procedure: ELECTROMAGNETIC NAVIGATION BRONCHOSCOPY;  Surgeon: Brenna Adine CROME, DO;  Location: [facility]  ENDOSCOPY;  Service: Pulmonary;;   ETT  10/2011   WNL, no evidence of ischemia, excellent exercise tolerance   EXPLORATORY LAPAROTOMY     INTERCOSTAL NERVE BLOCK Right 01/20/2020   Procedure:  Intercostal Nerve Block;  Surgeon: Shyrl Linnie KIDD, MD;  Location: [facility] OR;  Service: Thoracic;  Laterality: Right;   MRI head  07/2011   multiple foci deep and subcortical white matter, chronic ischemic vs demyelinating, no active disease   NODE DISSECTION  01/20/2020   Procedure: Node Dissection;  Surgeon: Shyrl Linnie KIDD, MD;  Location: [facility] OR;  Service: Thoracic;;   SUBMUCOSAL TATTOO INJECTION  01/20/2020   Procedure: SUBSTITIAL TATTOO INJECTION;  Surgeon: Brenna Adine CROME, DO;  Location: [facility] ENDOSCOPY;  Service: Pulmonary;;   TONSILLECTOMY AND ADENOIDECTOMY     Vein removal     removed from legs   VIDEO ASSISTED THORACOSCOPY (VATS)/WEDGE RESECTION Right 01/20/2020   XI ROBOTIC ASSISTED THORASCOPY-RIGHT LOWER LOBE WEDGE RESECTION, RIGHT LOWER LOBECTOMYRightGeneral   VIDEO BRONCHOSCOPY  01/20/2020   Procedure: VIDEO BRONCHOSCOPY WITH FLUORO;  Surgeon: Brenna Adine CROME, DO;  Location: [facility];  Service: Pulmonary;;   Social History:  reports that she quit smoking about 8 years ago. Her smoking use included cigarettes. She started smoking about 45 years ago. She has a 36.7 pack-year smoking history. She has never used smokeless tobacco. She reports that she does not currently use alcohol. She reports that she does not use drugs.  Allergies  Allergen Reactions   Penicillins Swelling    REACTION: swelling tongue - States has taken Cephalosporins without difficulty in past    Family History  Problem Relation Age of Onset   Osteoporosis Other        great grandmother   Stroke Maternal Grandmother    Coronary artery disease Maternal Grandfather 69   Diabetes Maternal Grandfather    Coronary artery disease Paternal Grandmother 49   Coronary artery disease Paternal Grandfather 92   Lung cancer Maternal Aunt     Prior to Admission medications   Medication Sig Start Date End Date Taking? Authorizing Provider  traZODone  (DESYREL ) 50 MG tablet Take 25-50 mg by mouth at bedtime as  needed for sleep. 05/20/24  Yes [provider]  albuterol  (VENTOLIN  HFA) 108 (90 Base) MCG/ACT inhaler Inhale 2 puffs into the lungs every 6 (six) hours as needed for wheezing or shortness of breath. 08/15/22   Brenna Adine L, DO  b complex vitamins capsule Take 1 capsule by mouth daily.    [provider]  famotidine  (PEPCID ) 20 MG tablet Take 1 tablet (20 mg total) by mouth daily as needed for heartburn or indigestion. 12/05/23   [name], MD  Multiple Vitamin (MULTIVITAMIN) tablet Take 1 tablet by mouth daily.    [provider]  nortriptyline  (PAMELOR ) 25 MG capsule Take 1 capsule (25 mg total) by mouth at bedtime. 05/07/24   [name], MD  rivastigmine  (EXELON ) 9.5 mg/24hr Place 1 patch (9.5 mg total) onto the skin daily. 12/26/23   [name], MD    Physical Exam: Vitals:   06/02/24 0807 06/02/24 0812 06/02/24 0815 06/02/24 0830  BP: 95/67  96/69 91/79  Pulse: 93  96 90  Resp: (!) 24  18 (!) 25  Temp:      TempSrc:      SpO2: 96%  93% 98%  Weight:  61.3 kg    Height:  5' 2 (1.575 m)     Physical Exam Constitutional:  General: She is awake. She is not in acute distress.    Appearance: She is ill-appearing.  HENT:     Head: Normocephalic.     Nose: No rhinorrhea.     Mouth/Throat:     Mouth: Mucous membranes are dry.  Eyes:     General: No scleral icterus.    Pupils: Pupils are equal, round, and reactive to light.  Neck:     Vascular: No JVD.  Cardiovascular:     Rate and Rhythm: Normal rate and regular rhythm.     Heart sounds: S1 normal and S2 normal.  Pulmonary:     Effort: Pulmonary effort is normal.     Breath sounds: Normal breath sounds. No wheezing, rhonchi or rales.  Abdominal:     General: Bowel sounds are normal. There is no distension.     Palpations: Abdomen is soft.     Tenderness: There is abdominal tenderness. There is no right CVA tenderness or left CVA tenderness.  Musculoskeletal:     Cervical  back: Neck supple.     Right lower leg: No edema.     Left lower leg: No edema.  Skin:    General: Skin is warm and dry.  Neurological:     General: No focal deficit present.     Mental Status: She is alert. She is disoriented.  Psychiatric:        Mood and Affect: Mood normal.        Behavior: Behavior normal. Behavior is cooperative.     Data Reviewed:  Results are pending, will review when available.  EKG: Vent. rate 81 BPM PR interval 185 ms QRS duration 97 ms QT/QTcB 396/460 ms P-R-T axes 14 5 7  Sinus rhythm Posterior infarct, old  Assessment and Plan: Principal Problem:   Abdominal pain Associated with:   Nausea and vomiting  Meeting criteria for:   SIRS (systemic inflammatory response syndrome) (HCC) Clinically suspicious for diverticulitis. Admit to telemetry/inpatient. Continue IV fluids. Continue cefepime  per pharmacy.   Continue metronidazole  500 mg IVPB q 12 hr. Continue vancomycin  per pharmacy. Follow-up blood culture and sensitivity Follow CBC and CMP in a.m.  Active Problems:   Confusion and disorientation Superimposed on:   Severe early onset Alzheimer dementia (HCC) In the setting of SIRS. Continue antibiotics as above. Supportive care. Redirect as needed. Continue nortriptyline  25 mg p.o. bedtime. Continue trazodone  50 mg p.o. bedtime as needed. Continue Exelon  9.5 mg / 24 hours patch daily.    Positive blood culture Contamination suspected. Will continue vancomycin  for now.    Abnormal EKG Check echocardiogram.    COPD with emphysema (HCC) Continue albuterol  MDI as needed.    Hyperlipidemia   PAD (peripheral artery disease) (HCC)   Thoracic aortic atherosclerosis (HCC) Continue atorvastatin  20 mg p.o. daily.    History of essential hypertension Currently not on antihypertensives. Blood pressure measurements have been soft. Monitor blood pressure closely.        Advance Care Planning:   Code Status: Full Code    Consults:   Family Communication: Her son [name] was at bedside.  Severity of Illness: The appropriate patient status for this patient is INPATIENT. Inpatient status is judged to be reasonable and necessary in order to provide the required intensity of service to ensure the patient's safety. The patient's presenting symptoms, physical exam findings, and initial radiographic and laboratory data in the context of their chronic comorbidities is felt to place them at high risk for further clinical deterioration. Furthermore, it is  not anticipated that the patient will be medically stable for discharge from the hospital within 2 midnights of admission.   * I certify that at the point of admission it is my clinical judgment that the patient will require inpatient hospital care spanning beyond 2 midnights from the point of admission due to high intensity of service, high risk for further deterioration and high frequency of surveillance required.*  Author: Alm Dorn Castor, MD 06/02/2024 10:56 AM  For on call review [email or web address].   This document was prepared using Dragon voice recognition software and may contain some unintended transcription errors.

## 2024-06-02 NOTE — Plan of Care (Signed)
  Problem: Fluid Volume: Goal: Hemodynamic stability will improve Outcome: Progressing   Problem: Clinical Measurements: Goal: Signs and symptoms of infection will decrease Outcome: Progressing   Problem: Clinical Measurements: Goal: Diagnostic test results will improve Outcome: Progressing

## 2024-06-02 NOTE — Sepsis Progress Note (Signed)
 Sepsis protocol monitored by eLink

## 2024-06-03 ENCOUNTER — Other Ambulatory Visit: Payer: Self-pay | Admitting: Family Medicine

## 2024-06-03 DIAGNOSIS — R6510 Systemic inflammatory response syndrome (SIRS) of non-infectious origin without acute organ dysfunction: Secondary | ICD-10-CM

## 2024-06-03 DIAGNOSIS — I739 Peripheral vascular disease, unspecified: Secondary | ICD-10-CM | POA: Diagnosis not present

## 2024-06-03 DIAGNOSIS — E785 Hyperlipidemia, unspecified: Secondary | ICD-10-CM | POA: Diagnosis not present

## 2024-06-03 DIAGNOSIS — J449 Chronic obstructive pulmonary disease, unspecified: Secondary | ICD-10-CM | POA: Diagnosis not present

## 2024-06-03 LAB — CBC WITH DIFFERENTIAL/PLATELET
Abs Immature Granulocytes: 0.11 K/uL — ABNORMAL HIGH (ref 0.00–0.07)
Basophils Absolute: 0.1 K/uL (ref 0.0–0.1)
Basophils Relative: 0 %
Eosinophils Absolute: 0 K/uL (ref 0.0–0.5)
Eosinophils Relative: 0 %
HCT: 40.7 % (ref 36.0–46.0)
Hemoglobin: 12.6 g/dL (ref 12.0–15.0)
Immature Granulocytes: 1 %
Lymphocytes Relative: 8 %
Lymphs Abs: 1.1 K/uL (ref 0.7–4.0)
MCH: 28.9 pg (ref 26.0–34.0)
MCHC: 31 g/dL (ref 30.0–36.0)
MCV: 93.3 fL (ref 80.0–100.0)
Monocytes Absolute: 0.7 K/uL (ref 0.1–1.0)
Monocytes Relative: 5 %
Neutro Abs: 11.9 K/uL — ABNORMAL HIGH (ref 1.7–7.7)
Neutrophils Relative %: 86 %
Platelets: 219 K/uL (ref 150–400)
RBC: 4.36 MIL/uL (ref 3.87–5.11)
RDW: 14.9 % (ref 11.5–15.5)
Smear Review: NORMAL
WBC: 13.9 K/uL — ABNORMAL HIGH (ref 4.0–10.5)
nRBC: 0 % (ref 0.0–0.2)

## 2024-06-03 LAB — COMPREHENSIVE METABOLIC PANEL WITH GFR
ALT: 16 U/L (ref 0–44)
AST: 17 U/L (ref 15–41)
Albumin: 3.1 g/dL — ABNORMAL LOW (ref 3.5–5.0)
Alkaline Phosphatase: 77 U/L (ref 38–126)
Anion gap: 11 (ref 5–15)
BUN: 17 mg/dL (ref 8–23)
CO2: 22 mmol/L (ref 22–32)
Calcium: 8.9 mg/dL (ref 8.9–10.3)
Chloride: 108 mmol/L (ref 98–111)
Creatinine, Ser: 0.65 mg/dL (ref 0.44–1.00)
GFR, Estimated: >60 mL/min (ref >60)
Glucose, Bld: 96 mg/dL (ref 70–99)
Potassium: 3.7 mmol/L (ref 3.5–5.1)
Sodium: 141 mmol/L (ref 135–145)
Total Bilirubin: 1.1 mg/dL (ref 0.0–1.2)
Total Protein: 5.8 g/dL — ABNORMAL LOW (ref 6.5–8.1)

## 2024-06-03 LAB — CULTURE, BLOOD (ROUTINE X 2)

## 2024-06-03 MED ORDER — TRAMADOL HCL 50 MG PO TABS
50.0000 mg | ORAL_TABLET | Freq: Once | ORAL | Status: DC
  Filled 2024-06-03: qty 1

## 2024-06-03 MED ORDER — BISACODYL 10 MG RE SUPP: 10.0000 mg | Freq: Every day | RECTAL | Status: DC | PRN

## 2024-06-03 MED ORDER — DOCUSATE SODIUM 100 MG PO CAPS
100.0000 mg | ORAL_CAPSULE | Freq: Two times a day (BID) | ORAL | Status: DC
  Administered 2024-06-03 – 2024-06-05 (×5): 100 mg via ORAL
  Filled 2024-06-03 (×5): qty 1

## 2024-06-03 MED ORDER — POLYETHYLENE GLYCOL 3350 17 G PO PACK
17.0000 g | PACK | Freq: Every day | ORAL | Status: DC
  Administered 2024-06-03 – 2024-06-04 (×2): 17 g via ORAL
  Filled 2024-06-03 (×2): qty 1

## 2024-06-03 MED ORDER — BISACODYL 10 MG RE SUPP
10.0000 mg | Freq: Once | RECTAL | Status: AC
  Administered 2024-06-03: 10 mg via RECTAL
  Filled 2024-06-03: qty 1

## 2024-06-03 MED ORDER — METOCLOPRAMIDE HCL 5 MG/ML IJ SOLN
5.0000 mg | Freq: Once | INTRAMUSCULAR | Status: AC
  Administered 2024-06-03: 5 mg via INTRAVENOUS
  Filled 2024-06-03: qty 2

## 2024-06-03 NOTE — Progress Notes (Signed)
 PROGRESS NOTE  Lindsay Mason FMW:[id number] DOB: 07-28-1951 DOA: 06/02/2024 PCP: [name], MD   LOS: 1 day   Brief narrative:  Lindsay Mason is a 73 y.o. 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  Patient had lethargy and worsening mental status.  She initially presented on 05/31/2024 with 2-year history of abdominal pain nausea and episodes of vomiting.  In the ED patient had slightly low blood pressure.  Labs were notable for WBC elevated at 13.7.  COVID RSV and influenza was negative lactic acid was normal.  Urinalysis showed no evidence of infection. 2 view chest x-ray on 05/31/2024 without infiltrate.  CT abdomen/pelvis done on 06/01/2024 with no acute findings but scattered colonic diverticulosis with small diverticulitis.  Patient received 2000 mL of normal saline bolus, acetaminophen  1000 mg p.o. x 1, also cefepime , metronidazole  and vancomycin  IVPB and was considered for admission to the hospital for further evaluation and treatment.   Assessment/Plan: Principal Problem:   SIRS (systemic inflammatory response syndrome) (HCC) Active Problems:   COPD with emphysema (HCC)   Thoracic aortic atherosclerosis (HCC)   History of essential hypertension   Severe early onset Alzheimer dementia (HCC)   PAD (peripheral artery disease) (HCC)   Confusion and disorientation   Positive blood culture   Nausea and vomiting   Abdominal pain   Hyperlipidemia   Abnormal EKG   Abdominal pain/  Nausea and vomiting likely acute diverticulitis.   SIRS (systemic inflammatory response syndrome) Clinical suspicion for acute acute diverticulitis.  On vancomycin  cefepime  and metronidazole .  Follow repeat blood  culture and sensitivity.  Urinalysis negative for infection.  Will de-escalate antibiotic depending upon clinical course and cultures.  CT scan of the abdomen pelvis done 06/01/2024 did not show any infection but clinically he is tender.  Might need repeat scan if not improving.     Confusion and disorientation in the background of  Severe early onset Alzheimer dementia.  Likely secondary to infection. On nortriptyline  trazodone  Exelon      Positive blood culture with staph species. Contamination suspected. Will continue vancomycin  for now.     COPD with emphysema  Continue albuterol  inhaler     Hyperlipidemia   PAD (peripheral artery disease)    Thoracic aortic atherosclerosis Continue Lipitor     History of essential hypertension Currently not on any medication.   Constipation.  Will put the patient on Dulcolax suppository.  DVT prophylaxis: SCDs Start: 06/02/24 1114   Disposition: Home with home health likely in 1 to 2 days.  Lives with her husband at home.  Status is: Inpatient Remains inpatient appropriate because: Pending clinical improvement, IV antibiotics need for follow-up of cultures    Code Status:     Code Status: Full Code  Family Communication: Spoke with the patient's son at bedside  Consultants: None  Procedures: None  Anti-infectives:   vancomycin , cefepime , Flagyl   Anti-infectives (From admission, onward)    Start     Dose/Rate Route Frequency Ordered Stop   06/03/24 1130  vancomycin  (VANCOCIN ) IVPB 1000 mg/200 mL premix        1,000 mg 200 mL/hr over 60 Minutes Intravenous Every 24 hours 06/02/24 1149 06/09/24 1129   06/03/24 0000  ceFEPIme  (MAXIPIME ) 2 g in sodium chloride  0.9 % 100 mL IVPB  2 g 200 mL/hr over 30 Minutes Intravenous Every 12 hours 06/02/24 1149 06/08/24 2359   06/02/24 2200  metroNIDAZOLE  (FLAGYL ) IVPB 500 mg        500 mg 100 mL/hr over 60 Minutes Intravenous Every 12 hours 06/02/24 1113 06/09/24 2159   06/02/24 1100   ceFEPIme  (MAXIPIME ) 2 g in sodium chloride  0.9 % 100 mL IVPB        2 g 200 mL/hr over 30 Minutes Intravenous  Once 06/02/24 1045 06/02/24 1449   06/02/24 1100  vancomycin  (VANCOCIN ) IVPB 1000 mg/200 mL premix        1,000 mg 200 mL/hr over 60 Minutes Intravenous  Once 06/02/24 1045 06/02/24 1233   06/02/24 1100  metroNIDAZOLE  (FLAGYL ) IVPB 500 mg        500 mg 100 mL/hr over 60 Minutes Intravenous  Once 06/02/24 1045 06/02/24 1818        Subjective: Today, patient was seen and examined at bedside.  Patient has advanced dementia and not much interactive.  Patient's son at bedside states that this evening proximal lower to the still has been having some nausea.  Has not had a bowel movement since Monday.  Was able to drink some.  Objective: Vitals:   06/03/24 0026 06/03/24 0454  BP: 127/88 (!) 142/89  Pulse: 88 87  Resp: 18 18  Temp: 98.3 F (36.8 C) 97.9 F (36.6 C)  SpO2: 100% 98%    Intake/Output Summary (Last 24 hours) at 06/03/2024 0835 Last data filed at 06/03/2024 0600 Gross per 24 hour  Intake 2835.22 ml  Output --  Net 2835.22 ml   Filed Weights   06/02/24 0812  Weight: 61.3 kg   Body mass index is 24.72 kg/m.   Physical Exam: GENERAL: Patient is alert awake  Not in obvious distress.  Elderly female, minimally interactive, advanced dementia HENT: No scleral pallor or icterus. Pupils equally reactive to light. Oral mucosa is moist NECK: is supple, no gross swelling noted. CHEST: Clear to auscultation. No crackles or wheezes.  Diminished breath sounds bilaterally. CVS: S1 and S2 heard, no murmur. Regular rate and rhythm.  ABDOMEN: Soft, left lower quadrant tenderness on palpation.  Distended abdomen tympanic, bowel sounds are present. EXTREMITIES: No edema. CNS: Cranial nerves are intact.  Moves all extremities, significant cognitive dysfunction SKIN: warm and dry without rashes.  Data Review: I have personally reviewed the following laboratory data and  studies,  CBC: Recent Labs  Lab 05/31/24 2054 06/02/24 1014 06/03/24 0557  WBC 20.2* 13.7* 13.9*  NEUTROABS  --  11.9* 11.9*  HGB 13.4 10.5* 12.6  HCT 41.0 32.6* 40.7  MCV 91.3 91.6 93.3  PLT 254 188 219   Basic Metabolic Panel: Recent Labs  Lab 05/31/24 2054 06/01/24 0230 06/02/24 0908 06/03/24 0557  NA 136  --  136 141  K 3.7  --  4.1 3.7  CL 104  --  103 108  CO2 22  --  24 22  GLUCOSE 150*  --  96 96  BUN 26*  --  26* 17  CREATININE 0.67  --  0.88 0.65  CALCIUM  8.9  --  9.4 8.9  MG  --  2.1  --   --    Liver Function Tests: Recent Labs  Lab 05/31/24 2054 06/02/24 0908 06/03/24 0557  AST 27 26 17   ALT 27 21 16   ALKPHOS 70 76 77  BILITOT 0.9 1.4* 1.1  PROT 6.5 6.3* 5.8*  ALBUMIN  3.8 3.4* 3.1*  Recent Labs  Lab 05/31/24 2054  LIPASE 27   No results for input(s): AMMONIA in the last 168 hours. Cardiac Enzymes: No results for input(s): CKTOTAL, CKMB, CKMBINDEX, TROPONINI in the last 168 hours. BNP (last 3 results) No results for input(s): BNP in the last 8760 hours.  ProBNP (last 3 results) No results for input(s): PROBNP in the last 8760 hours.  CBG: No results for input(s): GLUCAP in the last 168 hours. Recent Results (from the past 240 hours)  Blood Culture (routine x 2)     Status: Abnormal (Preliminary result)   Collection Time: 06/01/24  2:03 AM   Specimen: BLOOD  Result Value Ref Range Status   Specimen Description   Final    BLOOD RIGHT ANTECUBITAL Performed at [facility], [address], [place], [place] [address]    Special Requests   Final    BOTTLES DRAWN AEROBIC AND ANAEROBIC Blood Culture results may not be optimal due to an inadequate volume of blood received in culture bottles Performed at [facility], [address] [place], [place] [address]    Culture  Setup Time   Final    GRAM POSITIVE COCCI IN CLUSTERS AEROBIC BOTTLE ONLY CRITICAL RESULT CALLED TO, READ BACK BY  AND VERIFIED WITH: RN MARVAL W [id number] FCP    Culture (A)  Final    STAPHYLOCOCCUS HOMINIS THE SIGNIFICANCE OF ISOLATING THIS ORGANISM FROM A SINGLE SET OF BLOOD CULTURES WHEN MULTIPLE SETS ARE DRAWN IS UNCERTAIN. PLEASE NOTIFY THE MICROBIOLOGY DEPARTMENT WITHIN ONE WEEK IF SPECIATION AND SENSITIVITIES ARE REQUIRED. Performed at [facility], [address], [place], [place] [address]    Report Status PENDING  Incomplete  Blood Culture ID Panel (Reflexed)     Status: Abnormal   Collection Time: 06/01/24  2:03 AM  Result Value Ref Range Status   Enterococcus faecalis NOT DETECTED NOT DETECTED Final   Enterococcus Faecium NOT DETECTED NOT DETECTED Final   Listeria monocytogenes NOT DETECTED NOT DETECTED Final   Staphylococcus species DETECTED (A) NOT DETECTED Final    Comment: CRITICAL RESULT CALLED TO, READ BACK BY AND VERIFIED WITH: RN DEBBIE W [id number] FCP    Staphylococcus aureus (BCID) NOT DETECTED NOT DETECTED Final   Staphylococcus epidermidis NOT DETECTED NOT DETECTED Final   Staphylococcus lugdunensis NOT DETECTED NOT DETECTED Final   Streptococcus species NOT DETECTED NOT DETECTED Final   Streptococcus agalactiae NOT DETECTED NOT DETECTED Final   Streptococcus pneumoniae NOT DETECTED NOT DETECTED Final   Streptococcus pyogenes NOT DETECTED NOT DETECTED Final   A.calcoaceticus-baumannii NOT DETECTED NOT DETECTED Final   Bacteroides fragilis NOT DETECTED NOT DETECTED Final   Enterobacterales NOT DETECTED NOT DETECTED Final   Enterobacter cloacae complex NOT DETECTED NOT DETECTED Final   Escherichia coli NOT DETECTED NOT DETECTED Final   Klebsiella aerogenes NOT DETECTED NOT DETECTED Final   Klebsiella oxytoca NOT DETECTED NOT DETECTED Final   Klebsiella pneumoniae NOT DETECTED NOT DETECTED Final   Proteus species NOT DETECTED NOT DETECTED Final   Salmonella species NOT DETECTED NOT DETECTED Final   Serratia marcescens NOT DETECTED NOT DETECTED Final    Haemophilus influenzae NOT DETECTED NOT DETECTED Final   Neisseria meningitidis NOT DETECTED NOT DETECTED Final   Pseudomonas aeruginosa NOT DETECTED NOT DETECTED Final   Stenotrophomonas maltophilia NOT DETECTED NOT DETECTED Final   Candida albicans NOT DETECTED NOT DETECTED Final   Candida auris NOT DETECTED NOT DETECTED Final   Candida glabrata NOT DETECTED  NOT DETECTED Final   Candida krusei NOT DETECTED NOT DETECTED Final   Candida parapsilosis NOT DETECTED NOT DETECTED Final   Candida tropicalis NOT DETECTED NOT DETECTED Final   Cryptococcus neoformans/gattii NOT DETECTED NOT DETECTED Final    Comment: Performed at [facility], [address], [place], [place] [address]  Blood Culture (routine x 2)     Status: None (Preliminary result)   Collection Time: 06/01/24  2:08 AM   Specimen: BLOOD  Result Value Ref Range Status   Specimen Description   Final    BLOOD BLOOD RIGHT HAND Performed at [facility], [address], [place], [place] [address]    Special Requests   Final    BOTTLES DRAWN AEROBIC AND ANAEROBIC Blood Culture results may not be optimal due to an inadequate volume of blood received in culture bottles Performed at [facility], [address], [place], [place] [address]    Culture   Final    NO GROWTH 1 DAY Performed at [facility], [address], [place], [place] [address]    Report Status PENDING  Incomplete  Resp panel by RT-PCR (RSV, Flu A&B, Covid) Anterior Nasal Swab     Status: None   Collection Time: 06/01/24  2:30 AM   Specimen: Anterior Nasal Swab  Result Value Ref Range Status   SARS Coronavirus 2 by RT PCR NEGATIVE NEGATIVE Final    Comment: (NOTE) SARS-CoV-2 target nucleic acids are NOT DETECTED.  The SARS-CoV-2 RNA is generally detectable in upper respiratory specimens during the acute phase of infection. The lowest concentration of SARS-CoV-2 viral copies this assay can detect is 138  copies/mL. A negative result does not preclude SARS-Cov-2 infection and should not be used as the sole basis for treatment or other patient management decisions. A negative result may occur with  improper specimen collection/handling, submission of specimen other than nasopharyngeal swab, presence of viral mutation(s) within the areas targeted by this assay, and inadequate number of viral copies(<138 copies/mL). A negative result must be combined with clinical observations, patient history, and epidemiological information. The expected result is Negative.  Fact Sheet for Patients:  BloggerCourse.com  Fact Sheet for Healthcare Providers:  SeriousBroker.it  This test is no t yet approved or cleared by the [place]  FDA and  has been authorized for detection and/or diagnosis of SARS-CoV-2 by FDA under an Emergency Use Authorization (EUA). This EUA will remain  in effect (meaning this test can be used) for the duration of the COVID-19 declaration under Section 564(b)(1) of the Act, 21 U.S.C.section 360bbb-3(b)(1), unless the authorization is terminated  or revoked sooner.       Influenza A by PCR NEGATIVE NEGATIVE Final   Influenza B by PCR NEGATIVE NEGATIVE Final    Comment: (NOTE) The Xpert Xpress SARS-CoV-2/FLU/RSV plus assay is intended as an aid in the diagnosis of influenza from Nasopharyngeal swab specimens and should not be used as a sole basis for treatment. Nasal washings and aspirates are unacceptable for Xpert Xpress SARS-CoV-2/FLU/RSV testing.  Fact Sheet for Patients: BloggerCourse.com  Fact Sheet for Healthcare Providers: SeriousBroker.it  This test is not yet approved or cleared by the [place]  FDA and has been authorized for detection and/or diagnosis of SARS-CoV-2 by FDA under an Emergency Use Authorization (EUA). This EUA will remain in effect (meaning  this test can be used) for the duration of the COVID-19 declaration under Section 564(b)(1) of the Act, 21 U.S.C. section 360bbb-3(b)(1), unless  the authorization is terminated or revoked.     Resp Syncytial Virus by PCR NEGATIVE NEGATIVE Final    Comment: (NOTE) Fact Sheet for Patients: BloggerCourse.com  Fact Sheet for Healthcare Providers: SeriousBroker.it  This test is not yet approved or cleared by the [place]  FDA and has been authorized for detection and/or diagnosis of SARS-CoV-2 by FDA under an Emergency Use Authorization (EUA). This EUA will remain in effect (meaning this test can be used) for the duration of the COVID-19 declaration under Section 564(b)(1) of the Act, 21 U.S.C. section 360bbb-3(b)(1), unless the authorization is terminated or revoked.  Performed at [facility], [address], [place], [place] [address]   Resp panel by RT-PCR (RSV, Flu A&B, Covid) Anterior Nasal Swab     Status: None   Collection Time: 06/02/24 10:14 AM   Specimen: Anterior Nasal Swab  Result Value Ref Range Status   SARS Coronavirus 2 by RT PCR NEGATIVE NEGATIVE Final    Comment: (NOTE) SARS-CoV-2 target nucleic acids are NOT DETECTED.  The SARS-CoV-2 RNA is generally detectable in upper respiratory specimens during the acute phase of infection. The lowest concentration of SARS-CoV-2 viral copies this assay can detect is 138 copies/mL. A negative result does not preclude SARS-Cov-2 infection and should not be used as the sole basis for treatment or other patient management decisions. A negative result may occur with  improper specimen collection/handling, submission of specimen other than nasopharyngeal swab, presence of viral mutation(s) within the areas targeted by this assay, and inadequate number of viral copies(<138 copies/mL). A negative result must be combined with clinical observations, patient  history, and epidemiological information. The expected result is Negative.  Fact Sheet for Patients:  BloggerCourse.com  Fact Sheet for Healthcare Providers:  SeriousBroker.it  This test is no t yet approved or cleared by the [place]  FDA and  has been authorized for detection and/or diagnosis of SARS-CoV-2 by FDA under an Emergency Use Authorization (EUA). This EUA will remain  in effect (meaning this test can be used) for the duration of the COVID-19 declaration under Section 564(b)(1) of the Act, 21 U.S.C.section 360bbb-3(b)(1), unless the authorization is terminated  or revoked sooner.       Influenza A by PCR NEGATIVE NEGATIVE Final   Influenza B by PCR NEGATIVE NEGATIVE Final    Comment: (NOTE) The Xpert Xpress SARS-CoV-2/FLU/RSV plus assay is intended as an aid in the diagnosis of influenza from Nasopharyngeal swab specimens and should not be used as a sole basis for treatment. Nasal washings and aspirates are unacceptable for Xpert Xpress SARS-CoV-2/FLU/RSV testing.  Fact Sheet for Patients: BloggerCourse.com  Fact Sheet for Healthcare Providers: SeriousBroker.it  This test is not yet approved or cleared by the [place]  FDA and has been authorized for detection and/or diagnosis of SARS-CoV-2 by FDA under an Emergency Use Authorization (EUA). This EUA will remain in effect (meaning this test can be used) for the duration of the COVID-19 declaration under Section 564(b)(1) of the Act, 21 U.S.C. section 360bbb-3(b)(1), unless the authorization is terminated or revoked.     Resp Syncytial Virus by PCR NEGATIVE NEGATIVE Final    Comment: (NOTE) Fact Sheet for Patients: BloggerCourse.com  Fact Sheet for Healthcare Providers: SeriousBroker.it  This test is not yet approved or cleared by the [place]  FDA  and has been authorized for detection and/or diagnosis of SARS-CoV-2 by FDA under an Emergency Use Authorization (EUA). This EUA will remain in effect (meaning this test can be used) for  the duration of the COVID-19 declaration under Section 564(b)(1) of the Act, 21 U.S.C. section 360bbb-3(b)(1), unless the authorization is terminated or revoked.  Performed at [facility], [address] [place], [place] [address]      Studies: ECHOCARDIOGRAM COMPLETE Result Date: 06/02/2024    ECHOCARDIOGRAM REPORT   Patient Name:   DAVEN PINCKNEY Date of Exam: 06/02/2024 Medical Rec #:  [id number]        Height:       62.0 in Accession #:    [id number]       Weight:       135.1 lb Date of Birth:  Feb 26, 1951         BSA:          1.618 m Patient Age:    73 years         BP:           91/79 mmHg Patient Gender: F                HR:           85 bpm. Exam Location:  Inpatient Procedure: 2D Echo, Cardiac Doppler and Color Doppler (Both Spectral and Color            Flow Doppler were utilized during procedure). Indications:    Abnormal EKG                 Bacteremia  History:        Patient has no prior history of Echocardiogram examinations.                 Signs/Symptoms:Bacteremia.  Sonographer:    Vella Key Referring Phys: [id number] DAVID MANUEL ORTIZ IMPRESSIONS  1. Left ventricular ejection fraction, by estimation, is 65 to 70%. The left ventricle has normal function. The left ventricle has no regional wall motion abnormalities. There is mild concentric left ventricular hypertrophy. Left ventricular diastolic parameters are consistent with Grade I diastolic dysfunction (impaired relaxation).  2. Right ventricular systolic function is normal. The right ventricular size is normal.  3. The mitral valve is normal in structure. No evidence of mitral valve regurgitation. No evidence of mitral stenosis.  4. The aortic valve is tricuspid. There is mild calcification of the aortic valve. Aortic valve  regurgitation is not visualized. Aortic valve sclerosis/calcification is present, without any evidence of aortic stenosis.  5. The inferior vena cava is normal in size with greater than 50% respiratory variability, suggesting right atrial pressure of 3 mmHg. Conclusion(s)/Recommendation(s): No evidence of valvular vegetations on this transthoracic echocardiogram. Consider a transesophageal echocardiogram to exclude infective endocarditis if clinically indicated. FINDINGS  Left Ventricle: Left ventricular ejection fraction, by estimation, is 65 to 70%. The left ventricle has normal function. The left ventricle has no regional wall motion abnormalities. The left ventricular internal cavity size was normal in size. There is  mild concentric left ventricular hypertrophy. Left ventricular diastolic parameters are consistent with Grade I diastolic dysfunction (impaired relaxation). Right Ventricle: The right ventricular size is normal. No increase in right ventricular wall thickness. Right ventricular systolic function is normal. Left Atrium: Left atrial size was normal in size. Right Atrium: Right atrial size was normal in size. Pericardium: There is no evidence of pericardial effusion. Mitral Valve: The mitral valve is normal in structure. No evidence of mitral valve regurgitation. No evidence of mitral valve stenosis. Tricuspid Valve: The tricuspid valve is normal in structure. Tricuspid valve regurgitation is not demonstrated. No evidence of  tricuspid stenosis. Aortic Valve: The aortic valve is tricuspid. There is mild calcification of the aortic valve. Aortic valve regurgitation is not visualized. Aortic valve sclerosis/calcification is present, without any evidence of aortic stenosis. Pulmonic Valve: The pulmonic valve was normal in structure. Pulmonic valve regurgitation is not visualized. No evidence of pulmonic stenosis. Aorta: The aortic root is normal in size and structure. Venous: The inferior vena cava is  normal in size with greater than 50% respiratory variability, suggesting right atrial pressure of 3 mmHg. IAS/Shunts: No atrial level shunt detected by color flow Doppler.  LEFT VENTRICLE PLAX 2D LVIDd:         3.45 cm     Diastology LVIDs:         2.90 cm     LV e' medial:    5.44 cm/s LV PW:         1.25 cm     LV E/e' medial:  13.1 LV IVS:        1.35 cm     LV e' lateral:   7.83 cm/s                            LV E/e' lateral: 9.1  LV Volumes (MOD) LV vol d, MOD A2C: 96.6 ml LV vol d, MOD A4C: 85.8 ml LV vol s, MOD A2C: 49.6 ml LV vol s, MOD A4C: 38.6 ml LV SV MOD A2C:     47.0 ml LV SV MOD A4C:     85.8 ml LV SV MOD BP:      46.6 ml RIGHT VENTRICLE RV Basal diam:  2.80 cm RV S prime:     14.10 cm/s TAPSE (M-mode): 2.3 cm LEFT ATRIUM             Index        RIGHT ATRIUM           Index LA diam:        3.20 cm 1.98 cm/m   RA Area:     12.30 cm LA Vol (A2C):   44.2 ml 27.31 ml/m  RA Volume:   30.90 ml  19.09 ml/m LA Vol (A4C):   36.1 ml 22.31 ml/m LA Biplane Vol: 44.3 ml 27.37 ml/m  AORTIC VALVE LVOT Vmax:   95.30 cm/s LVOT Vmean:  66.300 cm/s LVOT VTI:    0.163 m  AORTA Ao Root diam: 3.30 cm MITRAL VALVE MV Area (PHT): 6.90 cm     SHUNTS MV Decel Time: 110 msec     Systemic VTI: 0.16 m MV E velocity: 71.10 cm/s MV A velocity: 109.00 cm/s MV E/A ratio:  0.65 Toribio Fuel MD Electronically signed by Toribio Fuel MD Signature Date/Time: 06/02/2024/1:47:03 PM    Final       Vernal Alstrom, MD  [facility] 06/03/2024  If 7PM-7AM, please contact night-coverage

## 2024-06-03 NOTE — Hospital Course (Addendum)
 73yo with h/o COPD, HLD, RLL lung CA s/p lobectomy, PAD, and dementia who presented on 8/20 with lethargy, found to have staph bacteremia. She complained of abdominal pain and CT showed concern for small bowel ischemia. Surgery performed  exploratory laparotomy with a small bowel resection on 8/27.  Patient has been on TPN, with very poor oral intake.  Repeat CT scan 9/02 with new enhancing fluid collections worrisome for abscesses. IR consulted for drain placement, which occurred on 9/03.  Family has declined PEG and she is weaning off TPN.  Palliative care is following, likely to dc home with hospice on 9/25.

## 2024-06-03 NOTE — Plan of Care (Signed)
   Problem: Fluid Volume: Goal: Hemodynamic stability will improve Outcome: Progressing   Problem: Clinical Measurements: Goal: Diagnostic test results will improve Outcome: Progressing Goal: Signs and symptoms of infection will decrease Outcome: Progressing

## 2024-06-03 NOTE — Evaluation (Signed)
 Occupational Therapy Evaluation Patient Details Name: Lindsay Mason MRN: [id number] DOB: 1951-06-26 Today's Date: 06/03/2024   History of Present Illness   73 y.o. female admitted with positive blood culture today with unspecified Staphylococcus species, lethargy, nausea/vomiting and worsening mental status. Dx of SIRS, possible diverticulitis. 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 Husband present during session, Pt poor historian, A/Ox1. Pt lives at home with husband who assists with all tasks, son comes by to assist when able, has aide 3X/wk for most of each day to assist as needed. PLOF supervision for all tasks, sometimes min A for dressing/bathing and safety awareness, ambulates primarily without AD but uses husbands RW at times. Pt currently limited due to pain and has felt weaker needing more assistance. Pt with poor problem solving and safety awareness, not aware of deficits, increased time for all tasks with consistent verbal cueing. Pt would benefit from continue acute OT to maximize functional strength/endurance to return to PLOF, HHOT recommended for return home, would benefit from RW and [facility] as Pt has been using husbands RW at times when she feels weaker.      If plan is discharge home, recommend the following:   A little help with walking and/or transfers;A little help with bathing/dressing/bathroom;Assistance with cooking/housework;Assist for transportation;Help with stairs  or ramp for entrance;Direct supervision/assist for medications management     Functional Status Assessment   Patient has had a recent decline in their functional status and demonstrates the ability to make significant improvements in function in a reasonable and predictable amount of time.     Equipment Recommendations   BSC/3in1;Other (comment) (RW)     Recommendations for Other Services         Precautions/Restrictions   Precautions Precautions: Fall Recall of Precautions/Restrictions: Impaired Precaution/Restrictions Comments: h/o dementia Restrictions Weight Bearing Restrictions Per Provider Order: No     Mobility Bed Mobility Overal bed mobility: Needs Assistance Bed Mobility: Supine to Sit     Supine to sit: Min assist, HOB elevated     General bed mobility comments: min A, HOB elevated, strong verbal cues for task initiation and continuation.    Transfers Overall transfer level: Needs assistance Equipment used: 1 person hand held assist Transfers: Sit to/from Stand, Bed to chair/wheelchair/BSC Sit to Stand: Min assist     Step pivot transfers: Min assist     General transfer comment: Pt min A for STS and transfer to recliner, 1 HHA      Balance Overall balance assessment: Needs assistance Sitting-balance support: No upper extremity supported, Feet supported Sitting balance-Leahy Scale: Good     Standing balance support: Single extremity supported Standing balance-Leahy Scale: Fair                             ADL either performed or assessed with clinical judgement   ADL Overall ADL's : At baseline;Needs assistance/impaired Eating/Feeding: Set up   Grooming: Supervision/safety   Upper Body Bathing: Set up;Supervision/ safety;Cueing for sequencing   Lower Body Bathing: Minimal assistance;Sitting/lateral leans;Sit to/from stand   Upper Body Dressing : Set up;Supervision/safety;Cueing  for sequencing   Lower Body Dressing:  Minimal assistance;Sitting/lateral leans;Sit to/from stand   Toilet Transfer: Minimal assistance   Toileting- Clothing Manipulation and Hygiene: Minimal assistance;Cueing for sequencing       Functional mobility during ADLs: Minimal assistance General ADL Comments: Pt likely at baseline, min A for dressing/bathing, cueing for sequencing, poor problem solving skills, decreased safety awareness.     Vision         Perception         Praxis         Pertinent Vitals/Pain Pain Assessment Pain Assessment: 0-10 Pain Score: 10-Worst pain ever Pain Location: chronic headache Pain Descriptors / Indicators: Aching, Constant, Discomfort, Grimacing Pain Intervention(s): Monitored during session     Extremity/Trunk Assessment Upper Extremity Assessment Upper Extremity Assessment: Generalized weakness   Lower Extremity Assessment Lower Extremity Assessment: Overall WFL for tasks assessed   Cervical / Trunk Assessment Cervical / Trunk Assessment: Normal   Communication Communication Communication: No apparent difficulties   Cognition Arousal: Alert Behavior During Therapy: WFL for tasks assessed/performed Cognition: History of cognitive impairments             OT - Cognition Comments: A/O x1, history of dementia, poor problem solving, decreased safety awareness                 Following commands: Impaired Following commands impaired: Follows one step commands with increased time     Cueing  General Comments   Cueing Techniques: Verbal cues;Gestural cues;Tactile cues;Visual cues      Exercises     Shoulder Instructions      Home Living Family/patient expects to be discharged to:: Private residence Living Arrangements: Spouse/significant other Available Help at Discharge: Family;Available 24 hours/day Type of Home: House Home Access: Stairs to enter [employer] of Steps: 3 Entrance Stairs-Rails: Can reach both Home Layout: Two level;Full  bath on main level;Able to live on main level with bedroom/bathroom Alternate Level Stairs-Number of Steps: 1 flight Alternate Level Stairs-Rails: Can reach both;Right;Left Bathroom Shower/Tub: Tub/shower unit;Walk-in shower;Door   [employer] Toilet: Standard Bathroom Accessibility: Yes How Accessible: Accessible via wheelchair;Accessible via walker Home Equipment: Cane - single point;Rolling Walker (2 wheels)   Additional Comments: spouse and son provide 90* assist, Pt lives on first floor      Prior Functioning/Environment Prior Level of Function : Needs assist             Mobility Comments: son reports pt went for long walks at baseline, sometimes needs RW for support ADLs Comments: Supervision due to dementia history, min A at times    OT Problem List: Decreased strength;Decreased activity tolerance;Impaired balance (sitting and/or standing);Decreased cognition;Decreased safety awareness;Pain   OT Treatment/Interventions: Self-care/ADL training;Therapeutic exercise;Energy conservation;DME and/or AE instruction;Therapeutic activities;Patient/family education;Balance training      OT Goals(Current goals can be found in the care plan section)   Acute Rehab OT Goals Patient Stated Goal: not able to participate in setting goals OT Goal Formulation: With family Time For Goal Achievement: 06/17/24 Potential to Achieve Goals: Good   OT Frequency:  Min 2X/week    Co-evaluation              AM-PAC OT 6 Clicks Daily Activity     Outcome Measure Help from another person eating meals?: A Little Help from another person taking care of personal grooming?: A Little Help from another person toileting, which includes using toliet, bedpan, or urinal?: A Little Help from another person bathing (including washing, rinsing, drying)?: A Little  Help from another person to put on and taking off regular upper body clothing?: A Little Help from another person to put on and taking off  regular lower body clothing?: A Little 6 Click Score: 18   End of Session Equipment Utilized During Treatment: Gait belt Nurse Communication: Mobility status  Activity Tolerance: Patient limited by pain Patient left: in bed;with call bell/phone within reach;with family/visitor present  OT Visit Diagnosis: Unsteadiness on feet (R26.81);Other abnormalities of gait and mobility (R26.89);Muscle weakness (generalized) (M62.81);Pain;Other symptoms and signs involving cognitive function Pain - part of body:  (head, stomach)                Time: 8690-8662 OT Time Calculation (min): 28 min Charges:  OT General Charges $OT Visit: 1 Visit OT Evaluation $OT Eval Low Complexity: 1 Low OT Treatments $Self Care/Home Management : 8-22 mins  [place], OTR/L   Elouise JONELLE Bott 06/03/2024, 1:43 PM

## 2024-06-03 NOTE — Evaluation (Signed)
 SLP Cancellation Note  Patient Details Name: Lindsay Mason MRN: [id number] DOB: 1950/10/25   Cancelled treatment:       Reason Eval/Treat Not Completed: Other (comment) (pt has baseline dementia and has support needed at home per chart review from OT  Lindsay POUR, MS [facility] SLP Acute Rehab Services Office [phone]   Lindsay Mason 06/03/2024, 5:38 PM

## 2024-06-03 NOTE — Telephone Encounter (Signed)
 LOV 05/07/24 CPE  NOV nothing scheduled   Last refill // # 30 patches w/ 3 refills

## 2024-06-03 NOTE — Evaluation (Addendum)
 Physical Therapy Evaluation Patient Details Name: Lindsay Mason MRN: [id number] DOB: Nov 01, 1950 Today's Date: 06/03/2024  History of Present Illness  73 y.o. female admitted with positive blood culture today with unspecified Staphylococcus species, lethargy, nausea/vomiting and worsening mental status. Dx of SIRS, possible diverticulitis. 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 Per son pt enjoys walking long distances at baseline. Pain currently limits pt's activity tolerance. She ambulated 14' x 2 with hand held assist of 1. Min assist for transfers, mod assist for bed mobility. Pt c/o abdominal pain throughout session, she was premedicated with tylenol , son reports they are trying to hold off on narcotics due to pt's dementia. Good progress expected when pain decreases.  Pt currently with functional limitations due to the deficits listed below (see PT Problem List). Pt will benefit from acute skilled PT to increase their independence and safety with mobility to allow discharge.           If plan is discharge home, recommend the following: A little help with walking and/or transfers;A little help with bathing/dressing/bathroom;Assistance with cooking/housework;Assist for transportation;Direct supervision/assist for medications management;Help with stairs or ramp for entrance;Direct supervision/assist for financial management;Supervision due to cognitive status   Can travel by private vehicle        Equipment  Recommendations None recommended by PT  Recommendations for Other Services       Functional Status Assessment Patient has had a recent decline in their functional status and demonstrates the ability to make significant improvements in function in a reasonable and predictable amount of time.     Precautions / Restrictions Precautions Precautions: Fall Recall of Precautions/Restrictions: Impaired Precaution/Restrictions Comments: h/o dementia Restrictions Weight Bearing Restrictions Per Provider Order: No      Mobility  Bed Mobility Overal bed mobility: Needs Assistance Bed Mobility: Supine to Sit     Supine to sit: Mod assist, HOB elevated     General bed mobility comments: assist to raise trunk    Transfers Overall transfer level: Needs assistance Equipment used: 1 person hand held assist Transfers: Sit to/from Stand Sit to Stand: Min assist           General transfer comment: assist to power up    Ambulation/Gait Ambulation/Gait assistance: Min assist Gait Distance (Feet): 14 Feet Assistive device: 1 person hand held assist Gait Pattern/deviations: Step-through pattern, Decreased stride length, Shuffle Gait velocity: decr     General Gait Details: 14' x 2 with HHA of 1, distance limited by abdominal pain  Stairs            Wheelchair Mobility     Tilt Bed    Modified Rankin (Stroke Patients Only)       Balance Overall balance assessment: Needs assistance   Sitting balance-Leahy Scale: Good     Standing balance support: Single extremity supported, During functional activity Standing balance-Leahy Scale: Fair                               Pertinent Vitals/Pain Pain Assessment Pain Assessment: PAINAD Breathing: occasional labored breathing, short period of hyperventilation Negative Vocalization: occasional moan/groan, low speech, negative/disapproving quality  Facial Expression: sad, frightened, frown Body Language: tense,  distressed pacing, fidgeting Consolability: unable to console, distract or reassure PAINAD Score: 6 Pain Intervention(s): Limited activity within patient's tolerance, Monitored during session, Repositioned, Premedicated before session    Home Living Family/patient expects to be discharged to:: Private residence Living Arrangements: Spouse/significant other Available Help at Discharge: Family;Available 24 hours/day Type of Home: House Home Access: Stairs to enter Entrance Stairs-Rails: Can reach both Entrance Stairs-Number of Steps: 3 Alternate Level Stairs-Number of Steps: 1 flight Home Layout: Two level;Full bath on main level Home Equipment: None Additional Comments: spouse and son provide 15* assist    Prior Function Prior Level of Function : Needs assist  Cognitive Assist : ADLs (cognitive)           Mobility Comments: son reports pt went for long walks at baseline ADLs Comments: Supervision due to dementia history     Extremity/Trunk Assessment   Upper Extremity Assessment Upper Extremity Assessment: Defer to OT evaluation    Lower Extremity Assessment Lower Extremity Assessment: Overall WFL for tasks assessed    Cervical / Trunk Assessment Cervical / Trunk Assessment: Normal  Communication   Communication Communication: No apparent difficulties    Cognition Arousal: Alert Behavior During Therapy: WFL for tasks assessed/performed   PT - Cognitive impairments: History of cognitive impairments, Memory, Problem solving, Safety/Judgement, Orientation, Sequencing   Orientation impairments: Place, Time, Situation                     Following commands: Impaired Following commands impaired: Follows one step commands with increased time     Cueing Cueing Techniques: Verbal cues, Gestural cues, Tactile cues, Visual cues     General Comments      Exercises     Assessment/Plan    PT Assessment Patient needs continued PT services  PT Problem List  Decreased activity tolerance;Decreased mobility;Pain       PT Treatment Interventions Gait training;Therapeutic exercise;Balance training;Functional mobility training    PT Goals (Current goals can be found in the Care Plan section)  Acute Rehab PT Goals Patient Stated Goal: to go for long walks again PT Goal Formulation: With patient/family Time For Goal Achievement: 06/17/24 Potential to Achieve Goals: Good    Frequency Min 3X/week     Co-evaluation               AM-PAC PT 6 Clicks Mobility  Outcome Measure Help needed turning from your back to your side while in a flat bed without using bedrails?: A Little Help needed moving from lying on your back to sitting on the side of a flat bed without using bedrails?: A Little Help needed moving to and from a bed to a chair (including a wheelchair)?: A Little Help needed standing up from a chair using your arms (e.g., wheelchair or bedside chair)?: A Little Help needed to walk in hospital room?: A Little Help needed climbing 3-5 steps with a railing? : A Little 6 Click Score: 18    End of Session Equipment Utilized During Treatment: Gait belt Activity Tolerance: Patient limited by pain Patient left: in chair;with call bell/phone within reach;with chair alarm set;with family/visitor present Nurse Communication: Mobility status PT Visit Diagnosis: Difficulty in walking, not elsewhere classified (R26.2);Pain    Time: 9076-9059 PT Time Calculation (min) (ACUTE ONLY): 17 min   Charges:   PT Evaluation $PT Eval Moderate Complexity: 1 Mod   PT General Charges $$ ACUTE PT VISIT: 1 Visit  Sylvan Nest Kistler PT 06/03/2024  Acute Rehabilitation Services  Office [phone]

## 2024-06-04 ENCOUNTER — Telehealth (HOSPITAL_BASED_OUTPATIENT_CLINIC_OR_DEPARTMENT_OTHER): Payer: Self-pay | Admitting: *Deleted

## 2024-06-04 ENCOUNTER — Inpatient Hospital Stay (HOSPITAL_COMMUNITY)

## 2024-06-04 DIAGNOSIS — R6510 Systemic inflammatory response syndrome (SIRS) of non-infectious origin without acute organ dysfunction: Secondary | ICD-10-CM | POA: Diagnosis not present

## 2024-06-04 DIAGNOSIS — E785 Hyperlipidemia, unspecified: Secondary | ICD-10-CM | POA: Diagnosis not present

## 2024-06-04 DIAGNOSIS — R109 Unspecified abdominal pain: Secondary | ICD-10-CM | POA: Diagnosis not present

## 2024-06-04 DIAGNOSIS — I739 Peripheral vascular disease, unspecified: Secondary | ICD-10-CM | POA: Diagnosis not present

## 2024-06-04 DIAGNOSIS — J449 Chronic obstructive pulmonary disease, unspecified: Secondary | ICD-10-CM | POA: Diagnosis not present

## 2024-06-04 DIAGNOSIS — R140 Abdominal distension (gaseous): Secondary | ICD-10-CM | POA: Diagnosis not present

## 2024-06-04 DIAGNOSIS — R112 Nausea with vomiting, unspecified: Secondary | ICD-10-CM | POA: Diagnosis not present

## 2024-06-04 LAB — BASIC METABOLIC PANEL WITH GFR
Anion gap: 10 (ref 5–15)
BUN: 15 mg/dL (ref 8–23)
CO2: 23 mmol/L (ref 22–32)
Calcium: 8.4 mg/dL — ABNORMAL LOW (ref 8.9–10.3)
Chloride: 105 mmol/L (ref 98–111)
Creatinine, Ser: 0.65 mg/dL (ref 0.44–1.00)
GFR, Estimated: >60 mL/min (ref >60)
Glucose, Bld: 112 mg/dL — ABNORMAL HIGH (ref 70–99)
Potassium: 3.3 mmol/L — ABNORMAL LOW (ref 3.5–5.1)
Sodium: 138 mmol/L (ref 135–145)

## 2024-06-04 LAB — CBC
HCT: 37.3 % (ref 36.0–46.0)
Hemoglobin: 12.1 g/dL (ref 12.0–15.0)
MCH: 29.9 pg (ref 26.0–34.0)
MCHC: 32.4 g/dL (ref 30.0–36.0)
MCV: 92.1 fL (ref 80.0–100.0)
Platelets: 275 K/uL (ref 150–400)
RBC: 4.05 MIL/uL (ref 3.87–5.11)
RDW: 14.7 % (ref 11.5–15.5)
WBC: 7.8 K/uL (ref 4.0–10.5)
nRBC: 0 % (ref 0.0–0.2)

## 2024-06-04 LAB — MAGNESIUM: Magnesium: 1.9 mg/dL (ref 1.7–2.4)

## 2024-06-04 MED ORDER — POTASSIUM CHLORIDE CRYS ER 20 MEQ PO TBCR
40.0000 meq | EXTENDED_RELEASE_TABLET | Freq: Once | ORAL | Status: AC
  Administered 2024-06-04: 40 meq via ORAL
  Filled 2024-06-04: qty 2

## 2024-06-04 MED ORDER — DEXTROSE-SODIUM CHLORIDE 5-0.45 % IV SOLN: INTRAVENOUS | Status: DC

## 2024-06-04 MED ORDER — BISACODYL 10 MG RE SUPP
10.0000 mg | Freq: Once | RECTAL | Status: AC
  Administered 2024-06-04: 10 mg via RECTAL
  Filled 2024-06-04: qty 1

## 2024-06-04 MED ORDER — SIMETHICONE 40 MG/0.6ML PO SUSP
40.0000 mg | Freq: Four times a day (QID) | ORAL | Status: DC | PRN
  Administered 2024-06-04: 40 mg via ORAL
  Filled 2024-06-04 (×2): qty 0.6

## 2024-06-04 MED ORDER — METOCLOPRAMIDE HCL 5 MG/ML IJ SOLN
5.0000 mg | Freq: Once | INTRAMUSCULAR | Status: AC
  Administered 2024-06-04: 5 mg via INTRAVENOUS
  Filled 2024-06-04: qty 2

## 2024-06-04 NOTE — Progress Notes (Addendum)
 PROGRESS NOTE  SHARRA CAYABYAB FMW:[id number] DOB: 23-Dec-1950 DOA: 06/02/2024 PCP: [name], MD   LOS: 2 days   Brief narrative:  Lindsay Mason is a 73 y.o. 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  Patient had lethargy and worsening mental status.  She initially presented on 05/31/2024 with 2-year history of abdominal pain nausea and episodes of vomiting.  In the ED patient had slightly low blood pressure.  Labs were notable for WBC elevated at 13.7.  COVID RSV and influenza was negative lactic acid was normal.  Urinalysis showed no evidence of infection. 2 view chest x-ray on 05/31/2024 without infiltrate.  CT abdomen/pelvis done on 06/01/2024 with no acute findings but scattered colonic diverticulosis with small diverticulitis.  Patient received 2000 mL of normal saline bolus, acetaminophen  1000 mg p.o. x 1, also cefepime , metronidazole  and vancomycin  IVPB and was considered for admission to the hospital for further evaluation and treatment.   Assessment/Plan: Principal Problem:   SIRS (systemic inflammatory response syndrome) (HCC) Active Problems:   COPD with emphysema (HCC)   Thoracic aortic atherosclerosis (HCC)   History of essential hypertension   Severe early onset Alzheimer dementia (HCC)   PAD (peripheral artery disease) (HCC)   Confusion and disorientation   Positive blood culture   Nausea and vomiting   Abdominal pain   Hyperlipidemia   Abnormal EKG   Abdominal pain/  Nausea and vomiting likely acute diverticulitis.   SIRS (systemic inflammatory response syndrome) Clinical suspicion for acute acute diverticulitis.  CT scan was negative.  On  cefepime  and metronidazole .  Blood  cultures negative urinalysis negative for infection.  Continue current antibiotic.  Patient seems to be clinically improving some so we will advance to full liquids today.  Might need repeat CT scan if not improving.  Will add Dulcolax suppository.  Colace and MiraLAX  was started yesterday for abdominal distention and constipation.     Confusion and disorientation in the background of  Severe early onset Alzheimer dementia.  Likely metabolic encephalopathy.  Likely secondary to infection.  Slightly improved today.  On nortriptyline  trazodone  Exelon      Positive blood culture with staph species. Contamination suspected.  Vancomycin  has been discontinued     COPD with emphysema  Continue albuterol  inhaler     Hyperlipidemia   PAD (peripheral artery disease)    Thoracic aortic atherosclerosis Continue Lipitor     History of essential hypertension Currently not on any medication.   Constipation.  Had a small bowel movement with Dulcolax suppository yesterday.  Will repeat today.  Has been started on MiraLAX  and Colace.  Weakness deconditioning.  Seen by PT and recommend home health PT on discharge.  DVT prophylaxis: SCDs Start: 06/02/24 1114   Disposition: Home with home health likely in  2 days.  Lives with her husband at home.  Status is: Inpatient Remains inpatient appropriate because: Pending clinical improvement, IV antibiotics, need for follow-up of cultures    Code Status:     Code Status: Full Code  Family Communication: Spoke with the patient's son and his wife at bedside  Consultants: None  Procedures: None  Anti-infectives:  , cefepime , Flagyl   Anti-infectives (From admission, onward)    Start     Dose/Rate Route Frequency Ordered Stop   06/03/24 1130  vancomycin  (  VANCOCIN ) IVPB 1000 mg/200 mL premix  Status:  Discontinued        1,000 mg 200 mL/hr over 60 Minutes Intravenous Every 24 hours 06/02/24 1149 06/03/24 0949   06/03/24 0000  ceFEPIme  (MAXIPIME ) 2 g  in sodium chloride  0.9 % 100 mL IVPB        2 g 200 mL/hr over 30 Minutes Intravenous Every 12 hours 06/02/24 1149 06/08/24 2359   06/02/24 2200  metroNIDAZOLE  (FLAGYL ) IVPB 500 mg        500 mg 100 mL/hr over 60 Minutes Intravenous Every 12 hours 06/02/24 1113 06/09/24 2159   06/02/24 1100  ceFEPIme  (MAXIPIME ) 2 g in sodium chloride  0.9 % 100 mL IVPB        2 g 200 mL/hr over 30 Minutes Intravenous  Once 06/02/24 1045 06/02/24 1449   06/02/24 1100  vancomycin  (VANCOCIN ) IVPB 1000 mg/200 mL premix        1,000 mg 200 mL/hr over 60 Minutes Intravenous  Once 06/02/24 1045 06/02/24 1233   06/02/24 1100  metroNIDAZOLE  (FLAGYL ) IVPB 500 mg        500 mg 100 mL/hr over 60 Minutes Intravenous  Once 06/02/24 1045 06/02/24 1818        Subjective: Today, patient was seen and examined at bedside.  Patient's family at bedside.  Patient has less tenderness on palpation of the stomach but poor historian.  She did have vomiting yesterday but was able to tolerate liquids this morning.  Did participate with physical therapy yesterday and appears more interactive.   Objective: Vitals:   06/03/24 2021 06/04/24 0422  BP: (!) 152/99 (!) 152/97  Pulse: 94 92  Resp:  18  Temp: 99.7 F (37.6 C) 98.6 F (37 C)  SpO2: 99% 96%    Intake/Output Summary (Last 24 hours) at 06/04/2024 1044 Last data filed at 06/03/2024 1550 Gross per 24 hour  Intake 770.53 ml  Output --  Net 770.53 ml   Filed Weights   06/02/24 0812  Weight: 61.3 kg   Body mass index is 24.72 kg/m.   Physical Exam: GENERAL: Patient is alert awake  Not in obvious distress.  Elderly female, more interactive advanced dementia HENT: No scleral pallor or icterus. Pupils equally reactive to light. Oral mucosa is moist NECK: is supple, no gross swelling noted. CHEST: Clear to auscultation. No crackles or wheezes.  Diminished breath sounds bilaterally. CVS: S1 and S2 heard, no murmur. Regular rate and rhythm.  ABDOMEN: Soft, mild left  lower quadrant tenderness on palpation.  Mildly distended abdomen tympanic, bowel sounds are present. EXTREMITIES: No edema. CNS: Cranial nerves are intact.  Moves all extremities, significant cognitive dysfunction SKIN: warm and dry without rashes.  Data Review: I have personally reviewed the following laboratory data and studies,  CBC: Recent Labs  Lab 05/31/24 2054 06/02/24 1014 06/03/24 0557 06/04/24 0544  WBC 20.2* 13.7* 13.9* 7.8  NEUTROABS  --  11.9* 11.9*  --   HGB 13.4 10.5* 12.6 12.1  HCT 41.0 32.6* 40.7 37.3  MCV 91.3 91.6 93.3 92.1  PLT 254 188 219 275   Basic Metabolic Panel: Recent Labs  Lab 05/31/24 2054 06/01/24 0230 06/02/24 0908 06/03/24 0557 06/04/24 0544  NA 136  --  136 141 138  K 3.7  --  4.1 3.7 3.3*  CL 104  --  103 108 105  CO2 22  --  24 22 23   GLUCOSE 150*  --  96 96 112*  BUN 26*  --  26* 17 15  CREATININE 0.67  --  0.88 0.65 0.65  CALCIUM  8.9  --  9.4 8.9 8.4*  MG  --  2.1  --   --  1.9   Liver Function Tests: Recent Labs  Lab 05/31/24 2054 06/02/24 0908 06/03/24 0557  AST 27 26 17   ALT 27 21 16   ALKPHOS 70 76 77  BILITOT 0.9 1.4* 1.1  PROT 6.5 6.3* 5.8*  ALBUMIN  3.8 3.4* 3.1*   Recent Labs  Lab 05/31/24 2054  LIPASE 27   No results for input(s): AMMONIA in the last 168 hours. Cardiac Enzymes: No results for input(s): CKTOTAL, CKMB, CKMBINDEX, TROPONINI in the last 168 hours. BNP (last 3 results) No results for input(s): BNP in the last 8760 hours.  ProBNP (last 3 results) No results for input(s): PROBNP in the last 8760 hours.  CBG: No results for input(s): GLUCAP in the last 168 hours. Recent Results (from the past 240 hours)  Blood Culture (routine x 2)     Status: Abnormal   Collection Time: 06/01/24  2:03 AM   Specimen: BLOOD  Result Value Ref Range Status   Specimen Description   Final    BLOOD RIGHT ANTECUBITAL Performed at [facility], [address], [place],  [place] [address]    Special Requests   Final    BOTTLES DRAWN AEROBIC AND ANAEROBIC Blood Culture results may not be optimal due to an inadequate volume of blood received in culture bottles Performed at [facility], [address], [place], [place] [address]    Culture  Setup Time   Final    GRAM POSITIVE COCCI IN CLUSTERS AEROBIC BOTTLE ONLY CRITICAL RESULT CALLED TO, READ BACK BY AND VERIFIED WITH: RN MARVAL W [id number] FCP    Culture (A)  Final    STAPHYLOCOCCUS HOMINIS THE SIGNIFICANCE OF ISOLATING THIS ORGANISM FROM A SINGLE SET OF BLOOD CULTURES WHEN MULTIPLE SETS ARE DRAWN IS UNCERTAIN. PLEASE NOTIFY THE MICROBIOLOGY DEPARTMENT WITHIN ONE WEEK IF SPECIATION AND SENSITIVITIES ARE REQUIRED. Performed at [facility], [address] [place], [place] [address]    Report Status 06/03/2024 FINAL  Final  Blood Culture ID Panel (Reflexed)     Status: Abnormal   Collection Time: 06/01/24  2:03 AM  Result Value Ref Range Status   Enterococcus faecalis NOT DETECTED NOT DETECTED Final   Enterococcus Faecium NOT DETECTED NOT DETECTED Final   Listeria monocytogenes NOT DETECTED NOT DETECTED Final   Staphylococcus species DETECTED (A) NOT DETECTED Final    Comment: CRITICAL RESULT CALLED TO, READ BACK BY AND VERIFIED WITH: RN MARVAL ORN [id number] FCP    Staphylococcus aureus (BCID) NOT DETECTED NOT DETECTED Final   Staphylococcus epidermidis NOT DETECTED NOT DETECTED Final   Staphylococcus lugdunensis NOT DETECTED NOT DETECTED Final   Streptococcus species NOT DETECTED NOT DETECTED Final   Streptococcus agalactiae NOT DETECTED NOT DETECTED Final   Streptococcus pneumoniae NOT DETECTED NOT DETECTED Final   Streptococcus pyogenes NOT DETECTED NOT DETECTED Final   A.calcoaceticus-baumannii NOT DETECTED NOT DETECTED Final   Bacteroides fragilis NOT DETECTED NOT DETECTED Final   Enterobacterales NOT DETECTED NOT DETECTED Final   Enterobacter cloacae complex NOT DETECTED  NOT DETECTED Final   Escherichia coli NOT DETECTED NOT DETECTED Final   Klebsiella aerogenes NOT DETECTED NOT DETECTED Final   Klebsiella oxytoca NOT DETECTED NOT DETECTED Final   Klebsiella pneumoniae NOT DETECTED NOT DETECTED Final   Proteus species NOT DETECTED NOT  DETECTED Final   Salmonella species NOT DETECTED NOT DETECTED Final   Serratia marcescens NOT DETECTED NOT DETECTED Final   Haemophilus influenzae NOT DETECTED NOT DETECTED Final   Neisseria meningitidis NOT DETECTED NOT DETECTED Final   Pseudomonas aeruginosa NOT DETECTED NOT DETECTED Final   Stenotrophomonas maltophilia NOT DETECTED NOT DETECTED Final   Candida albicans NOT DETECTED NOT DETECTED Final   Candida auris NOT DETECTED NOT DETECTED Final   Candida glabrata NOT DETECTED NOT DETECTED Final   Candida krusei NOT DETECTED NOT DETECTED Final   Candida parapsilosis NOT DETECTED NOT DETECTED Final   Candida tropicalis NOT DETECTED NOT DETECTED Final   Cryptococcus neoformans/gattii NOT DETECTED NOT DETECTED Final    Comment: Performed at [facility], [address] [place], [place] [address]  Blood Culture (routine x 2)     Status: None (Preliminary result)   Collection Time: 06/01/24  2:08 AM   Specimen: BLOOD  Result Value Ref Range Status   Specimen Description   Final    BLOOD BLOOD RIGHT HAND Performed at [facility], [address], [place], [place] [address]    Special Requests   Final    BOTTLES DRAWN AEROBIC AND ANAEROBIC Blood Culture results may not be optimal due to an inadequate volume of blood received in culture bottles Performed at [facility], [address], [place], [place] [address]    Culture   Final    NO GROWTH 3 DAYS Performed at [facility], [address], [place], [place] [address]    Report Status PENDING  Incomplete  Resp panel by RT-PCR (RSV, Flu A&B, Covid) Anterior Nasal Swab     Status: None   Collection Time: 06/01/24   2:30 AM   Specimen: Anterior Nasal Swab  Result Value Ref Range Status   SARS Coronavirus 2 by RT PCR NEGATIVE NEGATIVE Final    Comment: (NOTE) SARS-CoV-2 target nucleic acids are NOT DETECTED.  The SARS-CoV-2 RNA is generally detectable in upper respiratory specimens during the acute phase of infection. The lowest concentration of SARS-CoV-2 viral copies this assay can detect is 138 copies/mL. A negative result does not preclude SARS-Cov-2 infection and should not be used as the sole basis for treatment or other patient management decisions. A negative result may occur with  improper specimen collection/handling, submission of specimen other than nasopharyngeal swab, presence of viral mutation(s) within the areas targeted by this assay, and inadequate number of viral copies(<138 copies/mL). A negative result must be combined with clinical observations, patient history, and epidemiological information. The expected result is Negative.  Fact Sheet for Patients:  BloggerCourse.com  Fact Sheet for Healthcare Providers:  SeriousBroker.it  This test is no t yet approved or cleared by the [place]  FDA and  has been authorized for detection and/or diagnosis of SARS-CoV-2 by FDA under an Emergency Use Authorization (EUA). This EUA will remain  in effect (meaning this test can be used) for the duration of the COVID-19 declaration under Section 564(b)(1) of the Act, 21 U.S.C.section 360bbb-3(b)(1), unless the authorization is terminated  or revoked sooner.       Influenza A by PCR NEGATIVE NEGATIVE Final   Influenza B by PCR NEGATIVE NEGATIVE Final    Comment: (NOTE) The Xpert Xpress SARS-CoV-2/FLU/RSV plus assay is intended as an aid in the diagnosis of influenza from Nasopharyngeal swab specimens and should not be used as a sole basis for treatment. Nasal washings and aspirates are unacceptable for  Xpert Xpress  SARS-CoV-2/FLU/RSV testing.  Fact Sheet for Patients: BloggerCourse.com  Fact Sheet for Healthcare Providers: SeriousBroker.it  This test is not yet approved or cleared by the [place]  FDA and has been authorized for detection and/or diagnosis of SARS-CoV-2 by FDA under an Emergency Use Authorization (EUA). This EUA will remain in effect (meaning this test can be used) for the duration of the COVID-19 declaration under Section 564(b)(1) of the Act, 21 U.S.C. section 360bbb-3(b)(1), unless the authorization is terminated or revoked.     Resp Syncytial Virus by PCR NEGATIVE NEGATIVE Final    Comment: (NOTE) Fact Sheet for Patients: BloggerCourse.com  Fact Sheet for Healthcare Providers: SeriousBroker.it  This test is not yet approved or cleared by the [place]  FDA and has been authorized for detection and/or diagnosis of SARS-CoV-2 by FDA under an Emergency Use Authorization (EUA). This EUA will remain in effect (meaning this test can be used) for the duration of the COVID-19 declaration under Section 564(b)(1) of the Act, 21 U.S.C. section 360bbb-3(b)(1), unless the authorization is terminated or revoked.  Performed at [facility], [address], [place], [place] [address]   Blood Culture (routine x 2)     Status: None (Preliminary result)   Collection Time: 06/02/24  8:35 AM   Specimen: BLOOD  Result Value Ref Range Status   Specimen Description   Final    BLOOD RIGHT ANTECUBITAL Performed at [facility], [address], [place], [place] [address]    Special Requests   Final    BOTTLES DRAWN AEROBIC AND ANAEROBIC Blood Culture adequate volume Performed at [facility], [address] [place], [place] [address]    Culture   Final    NO GROWTH 2 DAYS Performed at [facility], [address], [place], [place] [address]    Report Status PENDING  Incomplete  Resp panel by RT-PCR (RSV, Flu A&B, Covid) Anterior Nasal Swab     Status: None   Collection Time: 06/02/24 10:14 AM   Specimen: Anterior Nasal Swab  Result Value Ref Range Status   SARS Coronavirus 2 by RT PCR NEGATIVE NEGATIVE Final    Comment: (NOTE) SARS-CoV-2 target nucleic acids are NOT DETECTED.  The SARS-CoV-2 RNA is generally detectable in upper respiratory specimens during the acute phase of infection. The lowest concentration of SARS-CoV-2 viral copies this assay can detect is 138 copies/mL. A negative result does not preclude SARS-Cov-2 infection and should not be used as the sole basis for treatment or other patient management decisions. A negative result may occur with  improper specimen collection/handling, submission of specimen other than nasopharyngeal swab, presence of viral mutation(s) within the areas targeted by this assay, and inadequate number of viral copies(<138 copies/mL). A negative result must be combined with clinical observations, patient history, and epidemiological information. The expected result is Negative.  Fact Sheet for Patients:  BloggerCourse.com  Fact Sheet for Healthcare Providers:  SeriousBroker.it  This test is no t yet approved or cleared by the [place]  FDA and  has been authorized for detection and/or diagnosis of SARS-CoV-2 by FDA under an Emergency Use Authorization (EUA). This EUA will remain  in effect (meaning this test can be used) for the duration of the COVID-19 declaration under Section 564(b)(1) of the Act, 21 U.S.C.section 360bbb-3(b)(1), unless the authorization is terminated  or revoked sooner.       Influenza A by PCR NEGATIVE NEGATIVE Final   Influenza B by PCR  NEGATIVE NEGATIVE Final    Comment: (NOTE) The Xpert Xpress SARS-CoV-2/FLU/RSV plus assay is intended as an aid in the diagnosis of  influenza from Nasopharyngeal swab specimens and should not be used as a sole basis for treatment. Nasal washings and aspirates are unacceptable for Xpert Xpress SARS-CoV-2/FLU/RSV testing.  Fact Sheet for Patients: BloggerCourse.com  Fact Sheet for Healthcare Providers: SeriousBroker.it  This test is not yet approved or cleared by the [place]  FDA and has been authorized for detection and/or diagnosis of SARS-CoV-2 by FDA under an Emergency Use Authorization (EUA). This EUA will remain in effect (meaning this test can be used) for the duration of the COVID-19 declaration under Section 564(b)(1) of the Act, 21 U.S.C. section 360bbb-3(b)(1), unless the authorization is terminated or revoked.     Resp Syncytial Virus by PCR NEGATIVE NEGATIVE Final    Comment: (NOTE) Fact Sheet for Patients: BloggerCourse.com  Fact Sheet for Healthcare Providers: SeriousBroker.it  This test is not yet approved or cleared by the [place]  FDA and has been authorized for detection and/or diagnosis of SARS-CoV-2 by FDA under an Emergency Use Authorization (EUA). This EUA will remain in effect (meaning this test can be used) for the duration of the COVID-19 declaration under Section 564(b)(1) of the Act, 21 U.S.C. section 360bbb-3(b)(1), unless the authorization is terminated or revoked.  Performed at [facility], [address] [place], [place] [address]   Blood Culture (routine x 2)     Status: None (Preliminary result)   Collection Time: 06/02/24 12:45 PM   Specimen: BLOOD  Result Value Ref Range Status   Specimen Description   Final    BLOOD BLOOD LEFT ARM Performed at [facility], [address], [place], [place] [address]    Special Requests   Final    BOTTLES DRAWN AEROBIC ONLY Blood Culture adequate volume Performed at [facility], [address] [place], [place] [address]    Culture   Final    NO GROWTH 2 DAYS Performed at [facility], [address], [place], [place] [address]    Report Status PENDING  Incomplete     Studies: ECHOCARDIOGRAM COMPLETE Result Date: 06/02/2024    ECHOCARDIOGRAM REPORT   Patient Name:   Lindsay Mason Date of Exam: 06/02/2024 Medical Rec #:  [id number]        Height:       62.0 in Accession #:    [id number]       Weight:       135.1 lb Date of Birth:  13-Dec-1950         BSA:          1.618 m Patient Age:    73 years         BP:           91/79 mmHg Patient Gender: F                HR:           85 bpm. Exam Location:  Inpatient Procedure: 2D Echo, Cardiac Doppler and Color Doppler (Both Spectral and Color            Flow Doppler were utilized during procedure). Indications:    Abnormal EKG                 Bacteremia  History:        Patient has no prior history of Echocardiogram examinations.  Signs/Symptoms:Bacteremia.  Sonographer:    Vella Key Referring Phys: [id number] DAVID MANUEL ORTIZ IMPRESSIONS  1. Left ventricular ejection fraction, by estimation, is 65 to 70%. The left ventricle has normal function. The left ventricle has no regional wall motion abnormalities. There is mild concentric left ventricular hypertrophy. Left ventricular diastolic parameters are consistent with Grade I diastolic dysfunction (impaired relaxation).  2. Right ventricular systolic function is normal. The right ventricular size is normal.  3. The mitral valve is normal in structure. No evidence of mitral valve regurgitation. No evidence of mitral stenosis.  4. The aortic valve is tricuspid. There is mild calcification of the aortic valve. Aortic valve regurgitation is not visualized. Aortic valve sclerosis/calcification is present, without any evidence of aortic stenosis.  5. The inferior vena cava is normal in size with greater than 50% respiratory variability, suggesting right  atrial pressure of 3 mmHg. Conclusion(s)/Recommendation(s): No evidence of valvular vegetations on this transthoracic echocardiogram. Consider a transesophageal echocardiogram to exclude infective endocarditis if clinically indicated. FINDINGS  Left Ventricle: Left ventricular ejection fraction, by estimation, is 65 to 70%. The left ventricle has normal function. The left ventricle has no regional wall motion abnormalities. The left ventricular internal cavity size was normal in size. There is  mild concentric left ventricular hypertrophy. Left ventricular diastolic parameters are consistent with Grade I diastolic dysfunction (impaired relaxation). Right Ventricle: The right ventricular size is normal. No increase in right ventricular wall thickness. Right ventricular systolic function is normal. Left Atrium: Left atrial size was normal in size. Right Atrium: Right atrial size was normal in size. Pericardium: There is no evidence of pericardial effusion. Mitral Valve: The mitral valve is normal in structure. No evidence of mitral valve regurgitation. No evidence of mitral valve stenosis. Tricuspid Valve: The tricuspid valve is normal in structure. Tricuspid valve regurgitation is not demonstrated. No evidence of tricuspid stenosis. Aortic Valve: The aortic valve is tricuspid. There is mild calcification of the aortic valve. Aortic valve regurgitation is not visualized. Aortic valve sclerosis/calcification is present, without any evidence of aortic stenosis. Pulmonic Valve: The pulmonic valve was normal in structure. Pulmonic valve regurgitation is not visualized. No evidence of pulmonic stenosis. Aorta: The aortic root is normal in size and structure. Venous: The inferior vena cava is normal in size with greater than 50% respiratory variability, suggesting right atrial pressure of 3 mmHg. IAS/Shunts: No atrial level shunt detected by color flow Doppler.  LEFT VENTRICLE PLAX 2D LVIDd:         3.45 cm     Diastology  LVIDs:         2.90 cm     LV e' medial:    5.44 cm/s LV PW:         1.25 cm     LV E/e' medial:  13.1 LV IVS:        1.35 cm     LV e' lateral:   7.83 cm/s                            LV E/e' lateral: 9.1  LV Volumes (MOD) LV vol d, MOD A2C: 96.6 ml LV vol d, MOD A4C: 85.8 ml LV vol s, MOD A2C: 49.6 ml LV vol s, MOD A4C: 38.6 ml LV SV MOD A2C:     47.0 ml LV SV MOD A4C:     85.8 ml LV SV MOD BP:      46.6 ml RIGHT VENTRICLE RV  Basal diam:  2.80 cm RV S prime:     14.10 cm/s TAPSE (M-mode): 2.3 cm LEFT ATRIUM             Index        RIGHT ATRIUM           Index LA diam:        3.20 cm 1.98 cm/m   RA Area:     12.30 cm LA Vol (A2C):   44.2 ml 27.31 ml/m  RA Volume:   30.90 ml  19.09 ml/m LA Vol (A4C):   36.1 ml 22.31 ml/m LA Biplane Vol: 44.3 ml 27.37 ml/m  AORTIC VALVE LVOT Vmax:   95.30 cm/s LVOT Vmean:  66.300 cm/s LVOT VTI:    0.163 m  AORTA Ao Root diam: 3.30 cm MITRAL VALVE MV Area (PHT): 6.90 cm     SHUNTS MV Decel Time: 110 msec     Systemic VTI: 0.16 m MV E velocity: 71.10 cm/s MV A velocity: 109.00 cm/s MV E/A ratio:  0.65 Toribio Fuel MD Electronically signed by Toribio Fuel MD Signature Date/Time: 06/02/2024/1:47:03 PM    Final       Vernal Alstrom, MD  [facility] 06/04/2024  If 7PM-7AM, please contact night-coverage

## 2024-06-04 NOTE — TOC Initial Note (Signed)
 Transition of Care [facility]) - Initial/Assessment Note    Patient Details  Name: Lindsay Mason MRN: [id number] Date of Birth: Oct 15, 1950  [facility]) CM/SW Contact:    Bascom Service, RN Phone Number: 06/04/2024, 4:00 PM  Clinical Narrative: Patient has dementia. Left vm w/dtr Lindsay Mason [phone] to discuss d/c plans recc HHPT, 3n1,rw-await call back.                  Expected Discharge Plan: Home w Home Health Services Barriers to Discharge: Continued Medical Work up   Patient Goals and CMS Choice            Expected Discharge Plan and Services                                              Prior Living Arrangements/Services                       Activities of Daily Living   ADL Screening (condition at time of admission) Independently performs ADLs?: No Does the patient have a NEW difficulty with bathing/dressing/toileting/self-feeding that is expected to last >3 days?: Yes (Initiates electronic notice to provider for possible OT consult) Does the patient have a NEW difficulty with getting in/out of bed, walking, or climbing stairs that is expected to last >3 days?: Yes (Initiates electronic notice to provider for possible PT consult) Does the patient have a NEW difficulty with communication that is expected to last >3 days?: Yes (Initiates electronic notice to provider for possible SLP consult) Is the patient deaf or have difficulty hearing?: No Does the patient have difficulty seeing, even when wearing glasses/contacts?: No Does the patient have difficulty concentrating, remembering, or making decisions?: Yes  Permission Sought/Granted                  Emotional Assessment              Admission diagnosis:  Diverticulitis [K57.92] SIRS (systemic inflammatory response syndrome) (HCC) [R65.10] Patient Active Problem List   Diagnosis Date Noted   SIRS (systemic inflammatory response syndrome) (HCC) 06/02/2024   Positive blood  culture 06/02/2024   Nausea and vomiting 06/02/2024   Abdominal pain 06/02/2024   Hyperlipidemia 06/02/2024   Abnormal EKG 06/02/2024   Transaminitis 05/09/2024   Confusion and disorientation 03/02/2024   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 03/02/2024   Fall with injury 02/05/2024   Multiple closed fractures of ribs of left side with routine healing 02/05/2024   Urinary retention 02/05/2024   Non-small cell carcinoma of left lung, stage 1 (HCC) 01/20/2024   Pneumothorax 01/10/2024   PAD (peripheral artery disease) (HCC) 12/06/2023   UTI due to extended-spectrum beta lactamase (ESBL) producing Escherichia coli 12/06/2023   Acid reflux 11/06/2023   Sundowning 11/06/2023   Medicare annual wellness visit, subsequent 05/01/2022   Vitamin B12 deficiency 01/22/2021   History of essential hypertension 01/21/2021   Severe early onset Alzheimer dementia (HCC) 01/21/2021   History of primary malignant neoplasm of right lung 01/20/2020   S/P lobectomy of lung 01/20/2020   Advanced care planning/counseling discussion 06/26/2017   Degenerative scoliosis 06/26/2017   COPD with emphysema (HCC) 02/12/2016   Thoracic aortic atherosclerosis (HCC) 02/12/2016   Health maintenance examination 02/01/2016   Weight loss 02/01/2016   Microscopic colitis 06/05/2015   Abnormal MRI of  head 10/02/2011   Headache 08/06/2011   Ex-smoker 04/30/2007   Osteopenia 04/30/2007   PCP:  Lindsay Baller, MD Pharmacy:   [facility] [phone], [place] - [address] [place] [address] Phone: [phone] Fax: [phone]  Lindsay Mason - [facility] [address] [place] [address] Phone: [phone] Fax: [phone]     Social Drivers of Health (SDOH) Social History: SDOH Screenings   Food Insecurity: No Food Insecurity (06/02/2024)  Housing: Low Risk  (06/02/2024)  Transportation Needs: No Transportation Needs (06/02/2024)  Utilities:  Not At Risk (06/02/2024)  Alcohol Screen: Low Risk  (05/05/2024)  Depression (PHQ2-9): High Risk (05/07/2024)  Financial Resource Strain: Low Risk  (05/05/2024)  Physical Activity: Sufficiently Active (05/05/2024)  Social Connections: Moderately Isolated (06/02/2024)  Stress: No Stress Concern Present (05/05/2024)  Tobacco Use: Medium Risk (06/02/2024)  Health Literacy: Inadequate Health Literacy (05/05/2024)   SDOH Interventions:     Readmission Risk Interventions    01/11/2024   10:01 AM  Readmission Risk Prevention Plan  Post Dischage Appt Complete  Medication Screening Complete  Transportation Screening Complete

## 2024-06-04 NOTE — Plan of Care (Signed)
  Problem: Fluid Volume: Goal: Hemodynamic stability will improve Outcome: Progressing   Problem: Clinical Measurements: Goal: Signs and symptoms of infection will decrease Outcome: Progressing   Problem: Respiratory: Goal: Ability to maintain adequate ventilation will improve Outcome: Progressing   Problem: Clinical Measurements: Goal: Diagnostic test results will improve Outcome: Progressing   Problem: Activity: Goal: Risk for activity intolerance will decrease Outcome: Progressing   Problem: Nutrition: Goal: Adequate nutrition will be maintained Outcome: Progressing   Problem: Coping: Goal: Level of anxiety will decrease Outcome: Progressing   Problem: Elimination: Goal: Will not experience complications related to bowel motility Outcome: Progressing   Problem: Pain Managment: Goal: General experience of comfort will improve and/or be controlled Outcome: Progressing   Problem: Safety: Goal: Ability to remain free from injury will improve Outcome: Progressing

## 2024-06-04 NOTE — Telephone Encounter (Signed)
 Post ED Visit - Positive Culture Follow-up  Culture report reviewed by antimicrobial stewardship pharmacist: [facility] []  Rankin Dee, Pharm.D. []  Venetia Gully, Pharm.D., BCPS AQ-ID []  Garrel Crews, Pharm.D., BCPS []  Almarie Lunger, Pharm.D., BCPS []  [place], [place].D., BCPS, AAHIVP []  Rosaline Bihari, Pharm.D., BCPS, AAHIVP []  Vernell Meier, PharmD, BCPS []  Latanya Hint, PharmD, BCPS []  Donald Medley, PharmD, BCPS []  Rocky Bold, PharmD []  Dorothyann Alert, PharmD, BCPS []  Morene Babe, PharmD  [facility] [x]  Izetta Carl, PharmD []  Romona Bliss, PharmD []  Dolphus Roller, PharmD []  Veva Seip, Rph []  Vernell Daunt) Leonce, PharmD []  Eva Allis, PharmD []  Rosaline Millet, PharmD []  Iantha Batch, PharmD []  Arvin Gauss, PharmD []  Wanda Hasting, PharmD []  Ronal Rav, PharmD []  Rocky Slade, PharmD []  Bard Jeans, PharmD   Positive Blood culture Pt currently admitted no further patient follow-up is required at this time.  Lindsay Mason 06/04/2024, 1:02 PM

## 2024-06-04 NOTE — Progress Notes (Signed)
 Mobility Specialist - Progress Note   06/04/24 1000  Mobility  Activity Ambulated with assistance  Level of Assistance Contact guard assist, steadying assist  Assistive Device None  Distance Ambulated (ft) 450 ft  Range of Motion/Exercises Active  Activity Response Tolerated well  Mobility Referral Yes  Mobility visit 1 Mobility  Mobility Specialist Start Time (ACUTE ONLY) [id number]  Mobility Specialist Stop Time (ACUTE ONLY) 0939  Mobility Specialist Time Calculation (min) (ACUTE ONLY) 10 min   Pt received in bed and agreed to mobility. No c/o pain nor discomfort during session. Returned to chair with all needs met. Family in room.  Cyndee Ada Mobility Specialist

## 2024-06-05 ENCOUNTER — Inpatient Hospital Stay (HOSPITAL_COMMUNITY)

## 2024-06-05 DIAGNOSIS — R6510 Systemic inflammatory response syndrome (SIRS) of non-infectious origin without acute organ dysfunction: Secondary | ICD-10-CM | POA: Diagnosis not present

## 2024-06-05 DIAGNOSIS — R140 Abdominal distension (gaseous): Secondary | ICD-10-CM | POA: Diagnosis not present

## 2024-06-05 DIAGNOSIS — J449 Chronic obstructive pulmonary disease, unspecified: Secondary | ICD-10-CM | POA: Diagnosis not present

## 2024-06-05 DIAGNOSIS — Z4682 Encounter for fitting and adjustment of non-vascular catheter: Secondary | ICD-10-CM | POA: Diagnosis not present

## 2024-06-05 DIAGNOSIS — I739 Peripheral vascular disease, unspecified: Secondary | ICD-10-CM | POA: Diagnosis not present

## 2024-06-05 DIAGNOSIS — E785 Hyperlipidemia, unspecified: Secondary | ICD-10-CM | POA: Diagnosis not present

## 2024-06-05 LAB — BASIC METABOLIC PANEL WITH GFR
Anion gap: 7 (ref 5–15)
BUN: 11 mg/dL (ref 8–23)
CO2: 22 mmol/L (ref 22–32)
Calcium: 7.9 mg/dL — ABNORMAL LOW (ref 8.9–10.3)
Chloride: 106 mmol/L (ref 98–111)
Creatinine, Ser: 0.45 mg/dL (ref 0.44–1.00)
GFR, Estimated: >60 mL/min (ref >60)
Glucose, Bld: 297 mg/dL — ABNORMAL HIGH (ref 70–99)
Potassium: 3.4 mmol/L — ABNORMAL LOW (ref 3.5–5.1)
Sodium: 135 mmol/L (ref 135–145)

## 2024-06-05 LAB — CBC
HCT: 35.2 % — ABNORMAL LOW (ref 36.0–46.0)
Hemoglobin: 10.7 g/dL — ABNORMAL LOW (ref 12.0–15.0)
MCH: 28.5 pg (ref 26.0–34.0)
MCHC: 30.4 g/dL (ref 30.0–36.0)
MCV: 93.9 fL (ref 80.0–100.0)
Platelets: 252 K/uL (ref 150–400)
RBC: 3.75 MIL/uL — ABNORMAL LOW (ref 3.87–5.11)
RDW: 14.6 % (ref 11.5–15.5)
WBC: 4.2 K/uL (ref 4.0–10.5)
nRBC: 0 % (ref 0.0–0.2)

## 2024-06-05 LAB — MAGNESIUM: Magnesium: 1.8 mg/dL (ref 1.7–2.4)

## 2024-06-05 MED ORDER — DEXTROSE-SODIUM CHLORIDE 5-0.45 % IV SOLN: INTRAVENOUS | Status: AC

## 2024-06-05 MED ORDER — LORAZEPAM 2 MG/ML IJ SOLN: 1.0000 mg | Freq: Once | INTRAMUSCULAR | Status: DC

## 2024-06-05 MED ORDER — LORAZEPAM 2 MG/ML IJ SOLN
0.5000 mg | Freq: Once | INTRAMUSCULAR | Status: DC
  Filled 2024-06-05: qty 0.25

## 2024-06-05 NOTE — Plan of Care (Signed)
°  Problem: Clinical Measurements: °Goal: Diagnostic test results will improve °Outcome: Progressing °  °Problem: Clinical Measurements: °Goal: Ability to maintain clinical measurements within normal limits will improve °Outcome: Progressing °  °

## 2024-06-05 NOTE — Plan of Care (Signed)
  Problem: Fluid Volume: Goal: Hemodynamic stability will improve Outcome: Progressing   Problem: Clinical Measurements: Goal: Diagnostic test results will improve Outcome: Progressing Goal: Signs and symptoms of infection will decrease Outcome: Progressing   Problem: Respiratory: Goal: Ability to maintain adequate ventilation will improve Outcome: Progressing   Problem: Education: Goal: Knowledge of General Education information will improve Description: Including pain rating scale, medication(s)/side effects and non-pharmacologic comfort measures Outcome: Progressing   Problem: Clinical Measurements: Goal: Diagnostic test results will improve Outcome: Progressing   Problem: Activity: Goal: Risk for activity intolerance will decrease Outcome: Progressing

## 2024-06-05 NOTE — Consult Note (Signed)
 CC: Upset stomach  Requesting provider: Dr Sonjia  Reason for consult: possible SBO, abdominal pain  HPI: Lindsay Mason is an 73 y.o. female who we were consulted on for evaluation of abdominal distention, nausea vomiting.  She is not really able to provide any history due to her dementia.  Son is at the bedside.  History is obtained from him as well as the medical record.  Reports a few days ago she has been having diarrhea and some nausea and lower abdominal pain.  They were seen in the emergency room and had an elevated white blood cell count and was discharged with an antibiotic for presumed diverticulitis.  They were called back to the hospital due to a positive blood culture.  She was admitted started on IV antibiotics for SIRS and presumed diverticulitis.  However her CT scan did not show diverticulitis.  Since then the son states that she is no longer having bowel movements they have given her to suppositories which have resulted in small BMs but she has had abdominal distention now for a day or 2 as well as multiple episodes of vomiting.  Unclear if she has had abdominal surgery in the past.  Per TR[facility]- Lindsay Mason is a 73 y.o. 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  She was initially brought on Monday (05/31/2024) evening due to a 2-year history of abdominal pain, nausea with several episodes of  emesis since Friday, 05/28/2024 associated with confusion.  She is able to answer simple.  She denied any headache, chest, back or abdominal pain at this time.  No joint pain or muscle aches.   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   by CT - moderate centrilobular and paraseptal   Ex-smoker 08/2014   currently using e cig   Microscopic colitis 06/2015   lymphocytic by colonoscopy - started entocort [name])   Osteoporosis, unspecified    Scoliosis of lumbar spine    Thoracic aortic atherosclerosis (HCC) 02/2016   by CT    Past Surgical History:  Procedure Laterality Date   CATARACT EXTRACTION W/PHACO Right 02/21/2021   Procedure: CATARACT EXTRACTION PHACO AND INTRAOCULAR LENS PLACEMENT (IOC) RIGHT;  Surgeon: Mittie Gaskin, MD;  Location: [facility];  Service: Ophthalmology;  Laterality: Right;  cde 4.75 00:41.1 minutes 11.5%   CATARACT EXTRACTION W/PHACO Left 03/14/2021   Procedure: CATARACT EXTRACTION PHACO AND INTRAOCULAR LENS PLACEMENT (IOC) LEFT 4.07 00:44.6;  Surgeon: Mittie Gaskin, MD;  Location: [facility];  Service: Ophthalmology;  Laterality: Left;   COLONOSCOPY  06/2015   microscopic colitis [name])   DEXA  10/2008   Osteoporosis   DEXA  02/2014   T score osteopenia hip, osteoporosis spine, scoliosis   ELECTROMAGNETIC NAVIGATION BROCHOSCOPY  01/20/2020   Procedure: ELECTROMAGNETIC NAVIGATION BRONCHOSCOPY;  Surgeon: Brenna Adine CROME, DO;  Location: [facility];  Service: Pulmonary;;  ETT  10/2011   WNL, no evidence of ischemia, excellent exercise tolerance   EXPLORATORY LAPAROTOMY     INTERCOSTAL NERVE BLOCK Right 01/20/2020   Procedure: Intercostal Nerve Block;  Surgeon: Shyrl Linnie KIDD, MD;  Location: [facility] OR;  Service: Thoracic;  Laterality: Right;   MRI head  07/2011    multiple foci deep and subcortical white matter, chronic ischemic vs demyelinating, no active disease   NODE DISSECTION  01/20/2020   Procedure: Node Dissection;  Surgeon: Shyrl Linnie KIDD, MD;  Location: [facility] OR;  Service: Thoracic;;   SUBMUCOSAL TATTOO INJECTION  01/20/2020   Procedure: SUBSTITIAL TATTOO INJECTION;  Surgeon: Brenna Adine CROME, DO;  Location: [facility];  Service: Pulmonary;;   TONSILLECTOMY AND ADENOIDECTOMY     Vein removal     removed from legs   VIDEO ASSISTED THORACOSCOPY (VATS)/WEDGE RESECTION Right 01/20/2020   XI ROBOTIC ASSISTED THORASCOPY-RIGHT LOWER LOBE WEDGE RESECTION, RIGHT LOWER LOBECTOMYRightGeneral   VIDEO BRONCHOSCOPY  01/20/2020   Procedure: VIDEO BRONCHOSCOPY WITH FLUORO;  Surgeon: Brenna Adine CROME, DO;  Location: [facility];  Service: Pulmonary;;    Family History  Problem Relation Age of Onset   Osteoporosis Other        great grandmother   Stroke Maternal Grandmother    Coronary artery disease Maternal Grandfather 50   Diabetes Maternal Grandfather    Coronary artery disease Paternal Grandmother 35   Coronary artery disease Paternal Grandfather 41   Lung cancer Maternal Aunt     Social:  reports that she quit smoking about 8 years ago. Her smoking use included cigarettes. She started smoking about 45 years ago. She has a 36.7 pack-year smoking history. She has never used smokeless tobacco. She reports that she does not currently use alcohol. She reports that she does not use drugs.  Allergies:  Allergies  Allergen Reactions   Donepezil  Nausea And Vomiting   Penicillins Swelling    REACTION: swelling tongue - States has taken Cephalosporins without difficulty in past    Medications: I have reviewed the patient's current medications.   ROS -unable to obtain due to dementia  PE Blood pressure 135/81, pulse 78, temperature 97.9 F (36.6 C), temperature source Oral, resp. rate 18, height 5' 2 (1.575 m), weight 61.3 kg, SpO2  94%. Constitutional: NAD; conversant; no deformities Eyes: Moist conjunctiva; no lid lag; anicteric; PERRL Neck: Trachea midline; no thyromegaly Lungs: Normal respiratory effort; no tactile fremitus CV: RRR; no palpable thrills; no pitting edema GI: Abd soft, distended/bloated, minimal tenderness no rebound or guarding; no palpable hepatosplenomegaly MSK: no clubbing/cyanosis Psychiatric: Appropriate affect; alert and oriented x to self Lymphatic: No palpable cervical or axillary lymphadenopathy Skin: No rash, lesions or jaundice  Results for orders placed or performed during the hospital encounter of 06/02/24 (from the past 48 hours)  CBC     Status: None   Collection Time: 06/04/24  5:44 AM  Result Value Ref Range   WBC 7.8 4.0 - 10.5 K/uL   RBC 4.05 3.87 - 5.11 MIL/uL   Hemoglobin 12.1 12.0 - 15.0 g/dL   HCT 62.6 63.9 - 53.9 %   MCV 92.1 80.0 - 100.0 fL   MCH 29.9 26.0 - 34.0 pg   MCHC 32.4 30.0 - 36.0 g/dL   RDW 85.2 88.4 - 84.4 %   Platelets 275 150 - 400 K/uL   nRBC 0.0 0.0 - 0.2 %    Comment: Performed at [facility], [address], [place], [place] [address]  Basic  metabolic panel with GFR     Status: Abnormal   Collection Time: 06/04/24  5:44 AM  Result Value Ref Range   Sodium 138 135 - 145 mmol/L   Potassium 3.3 (L) 3.5 - 5.1 mmol/L   Chloride 105 98 - 111 mmol/L   CO2 23 22 - 32 mmol/L   Glucose, Bld 112 (H) 70 - 99 mg/dL    Comment: Glucose reference range applies only to samples taken after fasting for at least 8 hours.   BUN 15 8 - 23 mg/dL   Creatinine, Ser 9.34 0.44 - 1.00 mg/dL   Calcium  8.4 (L) 8.9 - 10.3 mg/dL   GFR, Estimated >39 >39 mL/min    Comment: (NOTE) Calculated using the CKD-EPI Creatinine Equation (2021)    Anion gap 10 5 - 15    Comment: Performed at [facility], [address] [place], [place] [address]  Magnesium      Status: None   Collection Time: 06/04/24  5:44 AM  Result Value Ref Range    Magnesium  1.9 1.7 - 2.4 mg/dL    Comment: Performed at [facility], [address] [place], [place] [address]  CBC     Status: Abnormal   Collection Time: 06/05/24  7:36 AM  Result Value Ref Range   WBC 4.2 4.0 - 10.5 K/uL   RBC 3.75 (L) 3.87 - 5.11 MIL/uL   Hemoglobin 10.7 (L) 12.0 - 15.0 g/dL   HCT 64.7 (L) 63.9 - 53.9 %   MCV 93.9 80.0 - 100.0 fL   MCH 28.5 26.0 - 34.0 pg   MCHC 30.4 30.0 - 36.0 g/dL   RDW 85.3 88.4 - 84.4 %   Platelets 252 150 - 400 K/uL   nRBC 0.0 0.0 - 0.2 %    Comment: Performed at [facility], [address] [place], [place] [address]  Basic metabolic panel with GFR     Status: Abnormal   Collection Time: 06/05/24  7:36 AM  Result Value Ref Range   Sodium 135 135 - 145 mmol/L   Potassium 3.4 (L) 3.5 - 5.1 mmol/L   Chloride 106 98 - 111 mmol/L   CO2 22 22 - 32 mmol/L   Glucose, Bld 297 (H) 70 - 99 mg/dL    Comment: Glucose reference range applies only to samples taken after fasting for at least 8 hours.   BUN 11 8 - 23 mg/dL   Creatinine, Ser 9.54 0.44 - 1.00 mg/dL   Calcium  7.9 (L) 8.9 - 10.3 mg/dL   GFR, Estimated >39 >39 mL/min    Comment: (NOTE) Calculated using the CKD-EPI Creatinine Equation (2021)    Anion gap 7 5 - 15    Comment: Performed at [facility], [address] [place], [place] [address]  Magnesium      Status: None   Collection Time: 06/05/24  7:36 AM  Result Value Ref Range   Magnesium  1.8 1.7 - 2.4 mg/dL    Comment: Performed at [facility], [address] [place], [place] [address]    DG Abd 1 View Result Date: 06/04/2024 CLINICAL DATA:  Abdominal pain.  Nausea and vomiting. EXAM: ABDOMEN - 1 VIEW COMPARISON:  CT abdomen and pelvis 06/01/2024 FINDINGS: New since prior study, there is gaseous distention of upper abdominal small bowel with decreased gas in the colon. This likely indicates small bowel obstruction. No radiopaque stones. Degenerative  changes in the spine and hips. Lumbar scoliosis convex towards  the right. Lung bases are clear. IMPRESSION: Gaseous distention of upper abdominal small bowel likely representing small bowel obstruction. Electronically Signed   By: Elsie Gravely M.D.   On: 06/04/2024 19:12    Imaging: Personally reviewed CT abdomen pelvis from January 09, 2024, May 31, 2024 and an abdominal x-ray; CT abdomen pelvis from August 19 really was not that impressive.  No right adnexal cyst but no overt bowel pathology; plain film from yesterday shows dilation of her small bowel.  A/P: Lindsay Mason is an 73 y.o. female with  Nausea/vomiting/abdominal distention Ileus versus partial SBO Early onset Alzheimer's dementia COPD with emphysema Hyperlipidemia PAD History of hypertension   Gi-recommend n.p.o. except meds, recommend NG tube placement given her abdominal distention.  I think we will need to repeat her CT scan ideally with contrast via the NG tube once she is decompressed a little bit so may be getting CT scan later this evening or tomorrow morning.  I did not see any evidence of diverticulitis on her admission CT.  I do not see any surgical scars on her abdomen.  No indications for urgent laparotomy/operative exploration  FEN: Correct electrolyte abnormalities, hypokalemia recommend potassium greater than 4, keep mag 2 or greater  VTE prophylaxis: SCDs, okay with Lovenox /subcu heparin   Data reviewed: Reviewed ED notes from August 18 through August 19, hospitalist admission note, hospitalist progress notes since admission, physical therapy and Occupational Therapy notes, speech pathology consult note; labs since August 18, vital signs for the past 48 hours along with ins and outs for the past 48 hours, radiation oncology progress note from spring 2025; echocardiogram June 02, 2024  High MDM  Camellia HERO. Tanda, MD, FACS General, Bariatric, & Minimally Invasive Surgery [facility]  [facility]

## 2024-06-05 NOTE — Progress Notes (Signed)
 PROGRESS NOTE  Lindsay KRETZSCHMAR FMW:[id number] DOB: [birth year] DOA: 06/02/2024 PCP: [name], MD   LOS: 3 days   Brief narrative:  Lindsay Mason is a 73 y.o. 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  Patient had lethargy and worsening mental status.  She initially presented on 05/31/2024 with 2-year history of abdominal pain nausea and episodes of vomiting.  In the ED patient had slightly low blood pressure.  Labs were notable for WBC elevated at 13.7.  COVID RSV and influenza was negative lactic acid was normal.  Urinalysis showed no evidence of infection. 2 view chest x-ray on 05/31/2024 without infiltrate.  CT abdomen/pelvis done on 06/01/2024 with no acute findings but scattered colonic diverticulosis with small diverticulitis.  Patient received 2000 mL of normal saline bolus, acetaminophen  1000 mg p.o. x 1, also cefepime , metronidazole  and vancomycin  IVPB and was considered for admission to the hospital for further evaluation and treatment.   Assessment/Plan: Principal Problem:   SIRS (systemic inflammatory response syndrome) (HCC) Active Problems:   COPD with emphysema (HCC)   Thoracic aortic atherosclerosis (HCC)   History of essential hypertension   Severe early onset Alzheimer dementia (HCC)   PAD (peripheral artery disease) (HCC)   Confusion and disorientation   Positive blood culture   Nausea and vomiting   Abdominal pain   Hyperlipidemia   Abnormal EKG   Abdominal pain/  Nausea and vomiting likely acute diverticulitis.   SIRS (systemic inflammatory response syndrome) Clinical suspicion for acute acute diverticulitis.  CT scan of the abdomen initially was negative.  On  cefepime  and  metronidazole .  Blood cultures negative in 3 days.  Patient continues to have abdominal distention, pain with with minimal bowel movements show x-ray of the abdomen was done which showed dilatation of the small bowel suggestive of possible bowel obstruction/ileus.  Communicated with general surgery and at this time plans for NG tube decompression.  Will hold off with oral diet.  Follow surgical recommendation.  Abdominal distention and pain.  X-ray with some distended bowel loops.  Possibility of ileus/bowel obstruction.  Communicated with general surgery.  Will keep with bowel rest IV fluids NG tube.    Confusion and disorientation in the background of  Severe early onset Alzheimer dementia.  Likely metabolic encephalopathy.  Likely secondary to infection.  At baseline today.  On nortriptyline  trazodone  Exelon      Positive blood culture with staph species. Contamination suspected.  Vancomycin  has been discontinued     COPD with emphysema  Continue albuterol  inhaler     Hyperlipidemia   PAD (peripheral artery disease)    Thoracic aortic atherosclerosis Continue Lipitor     History of essential hypertension Currently not on any medication.   Constipation.  Not much bowel movements with suppository.  Weakness, deconditioning.  Seen by PT and recommend home health PT on discharge.  DVT prophylaxis: SCDs Start: 06/02/24 1114   Disposition: Home with home health likely in 1 to 2 days.  Status is: Inpatient Remains inpatient appropriate because: Pending clinical improvement, IV antibiotics, need for follow-up of cultures, possible bowel obstruction    Code Status:     Code Status: Full Code  Family Communication: Spoke with the patient's son and his wife at bedside  Consultants: General Surgery  Procedures: None  Anti-infectives:  Cefepime  Flagyl   Anti-infectives (From admission, onward)    Start     Dose/Rate Route Frequency Ordered Stop   06/03/24 1130  vancomycin   (VANCOCIN ) IVPB 1000 mg/200 mL premix  Status:  Discontinued        1,000 mg 200 mL/hr over 60 Minutes Intravenous Every 24 hours 06/02/24 1149 06/03/24 0949   06/03/24 0000  ceFEPIme  (MAXIPIME ) 2 g in sodium chloride  0.9 % 100 mL IVPB        2 g 200 mL/hr over 30 Minutes Intravenous Every 12 hours 06/02/24 1149 06/08/24 2359   06/02/24 2200  metroNIDAZOLE  (FLAGYL ) IVPB 500 mg        500 mg 100 mL/hr over 60 Minutes Intravenous Every 12 hours 06/02/24 1113 06/09/24 2159   06/02/24 1100  ceFEPIme  (MAXIPIME ) 2 g in sodium chloride  0.9 % 100 mL IVPB        2 g 200 mL/hr over 30 Minutes Intravenous  Once 06/02/24 1045 06/02/24 1449   06/02/24 1100  vancomycin  (VANCOCIN ) IVPB 1000 mg/200 mL premix        1,000 mg 200 mL/hr over 60 Minutes Intravenous  Once 06/02/24 1045 06/02/24 1233   06/02/24 1100  metroNIDAZOLE  (FLAGYL ) IVPB 500 mg        500 mg 100 mL/hr over 60 Minutes Intravenous  Once 06/02/24 1045 06/02/24 1818        Subjective: Today, patient was seen and examined at bedside.  Patient's family at bedside.  Patient still has distention of the abdomen and did not have bowel movement despite suppository.  Had 1 episode of vomiting. Objective: Vitals:   06/04/24 1200 06/04/24 2036  BP: (!) 142/87 135/81  Pulse: 87 78  Resp: 16 18  Temp: 98.1 F (36.7 C) 97.9 F (36.6 C)  SpO2: 100% 94%    Intake/Output Summary (Last 24 hours) at 06/05/2024 1132 Last data filed at 06/05/2024 0900 Gross per 24 hour  Intake 224.48 ml  Output --  Net 224.48 ml   Filed Weights   06/02/24 0812  Weight: 61.3 kg   Body mass index is 24.72 kg/m.   Physical Exam:  General:  Average built, not in obvious distress, elderly female, underlying dementia, minimally interactive HENT:   No scleral pallor or icterus noted. Oral mucosa is moist.  Chest:  Clear breath sounds. . No crackles or wheezes.  CVS: S1 &S2 heard. No murmur.  Regular rate and rhythm. Abdomen: Soft, distended abdomen tympanic  with minimal bowel sounds, mild tenderness.   Extremities: No cyanosis, clubbing or edema.  Peripheral pulses are palpable. Psych: Alert, awake and significant cognitive dysfunction, CNS:  No cranial nerve deficits.  Moves all extremities Skin: Warm and dry.  No rashes noted.   Data Review: I have personally reviewed the following laboratory data and studies,  CBC: Recent Labs  Lab 05/31/24 2054 06/02/24 1014 06/03/24 0557 06/04/24 0544 06/05/24 0736  WBC 20.2* 13.7* 13.9* 7.8 4.2  NEUTROABS  --  11.9* 11.9*  --   --   HGB 13.4 10.5* 12.6 12.1 10.7*  HCT 41.0 32.6* 40.7 37.3 35.2*  MCV 91.3 91.6 93.3 92.1 93.9  PLT 254 188 219 275 252   Basic Metabolic Panel: Recent Labs  Lab 05/31/24 2054 06/01/24 0230 06/02/24 0908 06/03/24 0557 06/04/24 0544 06/05/24 0736  NA 136  --  136 141 138 135  K 3.7  --  4.1 3.7 3.3* 3.4*  CL 104  --  103 108 105 106  CO2 22  --  24 22 23 22   GLUCOSE 150*  --  96 96 112* 297*  BUN 26*  --  26* 17 15 11   CREATININE 0.67  --  0.88 0.65 0.65 0.45  CALCIUM  8.9  --  9.4 8.9 8.4* 7.9*  MG  --  2.1  --   --  1.9 1.8   Liver Function Tests: Recent Labs  Lab 05/31/24 2054 06/02/24 0908 06/03/24 0557  AST 27 26 17   ALT 27 21 16   ALKPHOS 70 76 77  BILITOT 0.9 1.4* 1.1  PROT 6.5 6.3* 5.8*  ALBUMIN  3.8 3.4* 3.1*   Recent Labs  Lab 05/31/24 2054  LIPASE 27   No results for input(s): AMMONIA in the last 168 hours. Cardiac Enzymes: No results for input(s): CKTOTAL, CKMB, CKMBINDEX, TROPONINI in the last 168 hours. BNP (last 3 results) No results for input(s): BNP in the last 8760 hours.  ProBNP (last 3 results) No results for input(s): PROBNP in the last 8760 hours.  CBG: No results for input(s): GLUCAP in the last 168 hours. Recent Results (from the past 240 hours)  Blood Culture (routine x 2)     Status: Abnormal   Collection Time: 06/01/24  2:03 AM   Specimen: BLOOD  Result Value Ref Range Status   Specimen  Description   Final    BLOOD RIGHT ANTECUBITAL Performed at [facility], [address] [place], [place] [address]    Special Requests   Final    BOTTLES DRAWN AEROBIC AND ANAEROBIC Blood Culture results may not be optimal due to an inadequate volume of blood received in culture bottles Performed at [facility], [address], [place], [place] [address]    Culture  Setup Time   Final    GRAM POSITIVE COCCI IN CLUSTERS AEROBIC BOTTLE ONLY CRITICAL RESULT CALLED TO, READ BACK BY AND VERIFIED WITH: RN MARVAL W [id number] FCP    Culture (A)  Final    STAPHYLOCOCCUS HOMINIS THE SIGNIFICANCE OF ISOLATING THIS ORGANISM FROM A SINGLE SET OF BLOOD CULTURES WHEN MULTIPLE SETS ARE DRAWN IS UNCERTAIN. PLEASE NOTIFY THE MICROBIOLOGY DEPARTMENT WITHIN ONE WEEK IF SPECIATION AND SENSITIVITIES ARE REQUIRED. Performed at [facility], [address], [place], [place] [address]    Report Status 06/03/2024 FINAL  Final  Blood Culture ID Panel (Reflexed)     Status: Abnormal   Collection Time: 06/01/24  2:03 AM  Result Value Ref Range Status   Enterococcus faecalis NOT DETECTED NOT DETECTED Final   Enterococcus Faecium NOT DETECTED NOT DETECTED Final   Listeria monocytogenes NOT DETECTED NOT DETECTED Final   Staphylococcus species DETECTED (A) NOT DETECTED Final    Comment: CRITICAL RESULT CALLED TO, READ BACK BY AND VERIFIED WITH: RN MARVAL ORN [id number] FCP    Staphylococcus aureus (BCID) NOT DETECTED NOT DETECTED Final   Staphylococcus epidermidis NOT DETECTED NOT DETECTED Final   Staphylococcus lugdunensis NOT DETECTED NOT DETECTED Final   Streptococcus species NOT DETECTED NOT DETECTED Final   Streptococcus agalactiae NOT DETECTED NOT DETECTED Final   Streptococcus pneumoniae NOT DETECTED NOT DETECTED Final   Streptococcus pyogenes NOT DETECTED NOT DETECTED Final   A.calcoaceticus-baumannii NOT DETECTED NOT DETECTED Final   Bacteroides fragilis  NOT DETECTED NOT DETECTED Final   Enterobacterales NOT DETECTED NOT DETECTED Final   Enterobacter cloacae complex NOT DETECTED NOT DETECTED Final   Escherichia coli NOT DETECTED NOT DETECTED Final   Klebsiella aerogenes NOT DETECTED NOT DETECTED Final  Klebsiella oxytoca NOT DETECTED NOT DETECTED Final   Klebsiella pneumoniae NOT DETECTED NOT DETECTED Final   Proteus species NOT DETECTED NOT DETECTED Final   Salmonella species NOT DETECTED NOT DETECTED Final   Serratia marcescens NOT DETECTED NOT DETECTED Final   Haemophilus influenzae NOT DETECTED NOT DETECTED Final   Neisseria meningitidis NOT DETECTED NOT DETECTED Final   Pseudomonas aeruginosa NOT DETECTED NOT DETECTED Final   Stenotrophomonas maltophilia NOT DETECTED NOT DETECTED Final   Candida albicans NOT DETECTED NOT DETECTED Final   Candida auris NOT DETECTED NOT DETECTED Final   Candida glabrata NOT DETECTED NOT DETECTED Final   Candida krusei NOT DETECTED NOT DETECTED Final   Candida parapsilosis NOT DETECTED NOT DETECTED Final   Candida tropicalis NOT DETECTED NOT DETECTED Final   Cryptococcus neoformans/gattii NOT DETECTED NOT DETECTED Final    Comment: Performed at [facility], [address] [place], [place] [address]  Blood Culture (routine x 2)     Status: None (Preliminary result)   Collection Time: 06/01/24  2:08 AM   Specimen: BLOOD  Result Value Ref Range Status   Specimen Description   Final    BLOOD BLOOD RIGHT HAND Performed at [facility], [address] [place], [place] [address]    Special Requests   Final    BOTTLES DRAWN AEROBIC AND ANAEROBIC Blood Culture results may not be optimal due to an inadequate volume of blood received in culture bottles Performed at [facility], [address], [place], [place] [address]    Culture   Final    NO GROWTH 4 DAYS Performed at [facility], [address], [place], [place] [address]    Report Status  PENDING  Incomplete  Resp panel by RT-PCR (RSV, Flu A&B, Covid) Anterior Nasal Swab     Status: None   Collection Time: 06/01/24  2:30 AM   Specimen: Anterior Nasal Swab  Result Value Ref Range Status   SARS Coronavirus 2 by RT PCR NEGATIVE NEGATIVE Final    Comment: (NOTE) SARS-CoV-2 target nucleic acids are NOT DETECTED.  The SARS-CoV-2 RNA is generally detectable in upper respiratory specimens during the acute phase of infection. The lowest concentration of SARS-CoV-2 viral copies this assay can detect is 138 copies/mL. A negative result does not preclude SARS-Cov-2 infection and should not be used as the sole basis for treatment or other patient management decisions. A negative result may occur with  improper specimen collection/handling, submission of specimen other than nasopharyngeal swab, presence of viral mutation(s) within the areas targeted by this assay, and inadequate number of viral copies(<138 copies/mL). A negative result must be combined with clinical observations, patient history, and epidemiological information. The expected result is Negative.  Fact Sheet for Patients:  BloggerCourse.com  Fact Sheet for Healthcare Providers:  SeriousBroker.it  This test is no t yet approved or cleared by the [place]  FDA and  has been authorized for detection and/or diagnosis of SARS-CoV-2 by FDA under an Emergency Use Authorization (EUA). This EUA will remain  in effect (meaning this test can be used) for the duration of the COVID-19 declaration under Section 564(b)(1) of the Act, 21 U.S.C.section 360bbb-3(b)(1), unless the authorization is terminated  or revoked sooner.       Influenza A by PCR NEGATIVE NEGATIVE Final   Influenza B by PCR NEGATIVE NEGATIVE Final    Comment: (NOTE) The Xpert Xpress SARS-CoV-2/FLU/RSV plus assay is intended as an aid in the diagnosis of  influenza from Nasopharyngeal swab specimens  and should not be used as a sole basis for treatment. Nasal washings and aspirates are unacceptable for Xpert Xpress SARS-CoV-2/FLU/RSV testing.  Fact Sheet for Patients: BloggerCourse.com  Fact Sheet for Healthcare Providers: SeriousBroker.it  This test is not yet approved or cleared by the [place]  FDA and has been authorized for detection and/or diagnosis of SARS-CoV-2 by FDA under an Emergency Use Authorization (EUA). This EUA will remain in effect (meaning this test can be used) for the duration of the COVID-19 declaration under Section 564(b)(1) of the Act, 21 U.S.C. section 360bbb-3(b)(1), unless the authorization is terminated or revoked.     Resp Syncytial Virus by PCR NEGATIVE NEGATIVE Final    Comment: (NOTE) Fact Sheet for Patients: BloggerCourse.com  Fact Sheet for Healthcare Providers: SeriousBroker.it  This test is not yet approved or cleared by the [place]  FDA and has been authorized for detection and/or diagnosis of SARS-CoV-2 by FDA under an Emergency Use Authorization (EUA). This EUA will remain in effect (meaning this test can be used) for the duration of the COVID-19 declaration under Section 564(b)(1) of the Act, 21 U.S.C. section 360bbb-3(b)(1), unless the authorization is terminated or revoked.  Performed at [facility], [address] [place], [place] [address]   Blood Culture (routine x 2)     Status: None (Preliminary result)   Collection Time: 06/02/24  8:35 AM   Specimen: BLOOD  Result Value Ref Range Status   Specimen Description   Final    BLOOD RIGHT ANTECUBITAL Performed at [facility], [address], [place], [place] [address]    Special Requests   Final    BOTTLES DRAWN AEROBIC AND ANAEROBIC Blood Culture adequate volume Performed at [facility], [address], [place], [place] [address]    Culture   Final    NO GROWTH 3 DAYS Performed at [facility], [address] [place], [place] [address]    Report Status PENDING  Incomplete  Resp panel by RT-PCR (RSV, Flu A&B, Covid) Anterior Nasal Swab     Status: None   Collection Time: 06/02/24 10:14 AM   Specimen: Anterior Nasal Swab  Result Value Ref Range Status   SARS Coronavirus 2 by RT PCR NEGATIVE NEGATIVE Final    Comment: (NOTE) SARS-CoV-2 target nucleic acids are NOT DETECTED.  The SARS-CoV-2 RNA is generally detectable in upper respiratory specimens during the acute phase of infection. The lowest concentration of SARS-CoV-2 viral copies this assay can detect is 138 copies/mL. A negative result does not preclude SARS-Cov-2 infection and should not be used as the sole basis for treatment or other patient management decisions. A negative result may occur with  improper specimen collection/handling, submission of specimen other than nasopharyngeal swab, presence of viral mutation(s) within the areas targeted by this assay, and inadequate number of viral copies(<138 copies/mL). A negative result must be combined with clinical observations, patient history, and epidemiological information. The expected result is Negative.  Fact Sheet for Patients:  BloggerCourse.com  Fact Sheet for Healthcare Providers:  SeriousBroker.it  This test is no t yet approved or cleared by the [place]  FDA and  has been authorized for detection and/or diagnosis of SARS-CoV-2 by FDA under an Emergency Use Authorization (EUA). This EUA will remain  in effect (meaning this test can be used) for the duration of the COVID-19 declaration under Section 564(b)(1) of the Act, 21 U.S.C.section 360bbb-3(b)(1), unless the authorization is terminated  or revoked sooner.       Influenza A by PCR NEGATIVE NEGATIVE Final   Influenza B by PCR NEGATIVE NEGATIVE  Final    Comment: (NOTE) The Xpert Xpress SARS-CoV-2/FLU/RSV plus assay is intended as an aid in the diagnosis of influenza from Nasopharyngeal swab specimens and should not be used as a sole basis for treatment. Nasal washings and aspirates are unacceptable for Xpert Xpress SARS-CoV-2/FLU/RSV testing.  Fact Sheet for Patients: BloggerCourse.com  Fact Sheet for Healthcare Providers: SeriousBroker.it  This test is not yet approved or cleared by the [place]  FDA and has been authorized for detection and/or diagnosis of SARS-CoV-2 by FDA under an Emergency Use Authorization (EUA). This EUA will remain in effect (meaning this test can be used) for the duration of the COVID-19 declaration under Section 564(b)(1) of the Act, 21 U.S.C. section 360bbb-3(b)(1), unless the authorization is terminated or revoked.     Resp Syncytial Virus by PCR NEGATIVE NEGATIVE Final    Comment: (NOTE) Fact Sheet for Patients: BloggerCourse.com  Fact Sheet for Healthcare Providers: SeriousBroker.it  This test is not yet approved or cleared by the [place]  FDA and has been authorized for detection and/or diagnosis of SARS-CoV-2 by FDA under an Emergency Use Authorization (EUA). This EUA will remain in effect (meaning this test can be used) for the duration of the COVID-19 declaration under Section 564(b)(1) of the Act, 21 U.S.C. section 360bbb-3(b)(1), unless the authorization is terminated or revoked.  Performed at [facility], [address], [place], [place] [address]   Blood Culture (routine x 2)     Status: None (Preliminary result)   Collection Time: 06/02/24 12:45 PM   Specimen: BLOOD  Result Value Ref Range Status   Specimen Description   Final    BLOOD BLOOD LEFT ARM Performed at [facility], [address], [place], [place] [address]     Special Requests   Final    BOTTLES DRAWN AEROBIC ONLY Blood Culture adequate volume Performed at [facility], [address] [place], [place] [address]    Culture   Final    NO GROWTH 3 DAYS Performed at [facility], [address] [place], [place] [address]    Report Status PENDING  Incomplete     Studies: DG Abd 1 View Result Date: 06/04/2024 CLINICAL DATA:  Abdominal pain.  Nausea and vomiting. EXAM: ABDOMEN - 1 VIEW COMPARISON:  CT abdomen and pelvis 06/01/2024 FINDINGS: New since prior study, there is gaseous distention of upper abdominal small bowel with decreased gas in the colon. This likely indicates small bowel obstruction. No radiopaque stones. Degenerative changes in the spine and hips. Lumbar scoliosis convex towards the right. Lung bases are clear. IMPRESSION: Gaseous distention of upper abdominal small bowel likely representing small bowel obstruction. Electronically Signed   By: Elsie Gravely M.D.   On: 06/04/2024 19:12      Vernal Alstrom, MD  [facility] 06/05/2024  If 7PM-7AM, please contact night-coverage

## 2024-06-06 ENCOUNTER — Inpatient Hospital Stay (HOSPITAL_COMMUNITY)

## 2024-06-06 DIAGNOSIS — E785 Hyperlipidemia, unspecified: Secondary | ICD-10-CM | POA: Diagnosis not present

## 2024-06-06 DIAGNOSIS — R6510 Systemic inflammatory response syndrome (SIRS) of non-infectious origin without acute organ dysfunction: Secondary | ICD-10-CM | POA: Diagnosis not present

## 2024-06-06 DIAGNOSIS — I739 Peripheral vascular disease, unspecified: Secondary | ICD-10-CM | POA: Diagnosis not present

## 2024-06-06 DIAGNOSIS — Z4682 Encounter for fitting and adjustment of non-vascular catheter: Secondary | ICD-10-CM | POA: Diagnosis not present

## 2024-06-06 DIAGNOSIS — J449 Chronic obstructive pulmonary disease, unspecified: Secondary | ICD-10-CM | POA: Diagnosis not present

## 2024-06-06 LAB — CBC
HCT: 39.6 % (ref 36.0–46.0)
Hemoglobin: 12.6 g/dL (ref 12.0–15.0)
MCH: 29.7 pg (ref 26.0–34.0)
MCHC: 31.8 g/dL (ref 30.0–36.0)
MCV: 93.4 fL (ref 80.0–100.0)
Platelets: 307 K/uL (ref 150–400)
RBC: 4.24 MIL/uL (ref 3.87–5.11)
RDW: 14.6 % (ref 11.5–15.5)
WBC: 8.8 K/uL (ref 4.0–10.5)
nRBC: 0 % (ref 0.0–0.2)

## 2024-06-06 LAB — BASIC METABOLIC PANEL WITH GFR
Anion gap: 10 (ref 5–15)
BUN: 11 mg/dL (ref 8–23)
CO2: 25 mmol/L (ref 22–32)
Calcium: 7.7 mg/dL — ABNORMAL LOW (ref 8.9–10.3)
Chloride: 104 mmol/L (ref 98–111)
Creatinine, Ser: 0.53 mg/dL (ref 0.44–1.00)
GFR, Estimated: >60 mL/min (ref >60)
Glucose, Bld: 107 mg/dL — ABNORMAL HIGH (ref 70–99)
Potassium: 3.1 mmol/L — ABNORMAL LOW (ref 3.5–5.1)
Sodium: 139 mmol/L (ref 135–145)

## 2024-06-06 LAB — CULTURE, BLOOD (ROUTINE X 2): Culture: NO GROWTH

## 2024-06-06 LAB — MAGNESIUM: Magnesium: 1.8 mg/dL (ref 1.7–2.4)

## 2024-06-06 MED ORDER — DIATRIZOATE MEGLUMINE & SODIUM 66-10 % PO SOLN
90.0000 mL | Freq: Once | ORAL | Status: AC
  Administered 2024-06-06: 90 mL via NASOGASTRIC
  Filled 2024-06-06: qty 90

## 2024-06-06 MED ORDER — POTASSIUM CHLORIDE 10 MEQ/100ML IV SOLN
10.0000 meq | INTRAVENOUS | Status: AC
  Administered 2024-06-06 (×4): 10 meq via INTRAVENOUS
  Filled 2024-06-06 (×4): qty 100

## 2024-06-06 NOTE — Progress Notes (Addendum)
 PROGRESS NOTE  Lindsay Mason FMW:[id number] DOB: 07-Aug-1951 DOA: 06/02/2024 PCP: [name], MD   LOS: 4 days   Brief narrative:  Lindsay Mason is a 73 y.o. 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  Patient had lethargy and worsening mental status.  She initially presented on 05/31/2024 with 2-year history of abdominal pain nausea and episodes of vomiting.  In the ED, patient had slightly low blood pressure.  Labs were notable for WBC elevated at 13.7.  COVID RSV and influenza was negative lactic acid was normal.  Urinalysis showed no evidence of infection. 2 view chest x-ray on 05/31/2024 without infiltrate.  CT abdomen/pelvis done on 06/01/2024 with no acute findings but scattered colonic diverticulosis with small diverticulitis.  Patient received 2000 mL of normal saline bolus, acetaminophen  1000 mg p.o. x 1, also cefepime , metronidazole  and vancomycin  IVPB and was considered for admission to the hospital for further evaluation and treatment.   Assessment/Plan: Principal Problem:   SIRS (systemic inflammatory response syndrome) (HCC) Active Problems:   COPD with emphysema (HCC)   Thoracic aortic atherosclerosis (HCC)   History of essential hypertension   Severe early onset Alzheimer dementia (HCC)   PAD (peripheral artery disease) (HCC)   Confusion and disorientation   Positive blood culture   Nausea and vomiting   Abdominal pain   Hyperlipidemia   Abnormal EKG   Abdominal pain/  Nausea and vomiting likely acute diverticulitis.   SIRS (systemic inflammatory response syndrome) Small bowel obstruction/ileus CT scan of the abdomen initially was negative.  On  cefepime  and metronidazole .  Blood  cultures negative in 4 days.  Surgery following at this time.  Status post NG tube placement with significant relief of distention and pain.  Follow general surgery recommendation.  Continue IV hydration.  Strict intake and output charting.  Currently n.p.o. except for ice chips and sips with meds.  Follow surgical recommendation.  On D5 half-normal saline at this time for hydration.  Hypokalemia.  Potassium was 3.1 today.  Will replenish with 40 mill equivalents of potassium now and repeat again in the evening.  Check levels in AM.  Magnesium  level today at 1.8.  Metabolic encephalopathy on the background of Alzheimer's dementia.   Likely secondary to infection.  At baseline today.  On nortriptyline  trazodone  Exelon      Positive blood culture with staph species. Contamination suspected.  Vancomycin  was discontinued.     COPD with emphysema  Continue albuterol  inhaler.  Appears compensated     Hyperlipidemia   PAD (peripheral artery disease)    Thoracic aortic atherosclerosis Continue Lipitor     History of essential hypertension Currently not on any medication.  As needed IV antihypertensive is necessary.  Weakness, deconditioning.  Seen by PT and recommend home health PT on discharge.  DVT prophylaxis: SCDs Start: 06/02/24 1114   Disposition: Home with home health likely in 1 to 2 days when improved  Status is: Inpatient Remains inpatient appropriate because: Pending clinical improvement, IV antibiotics, physical follow-up    Code Status:     Code Status: Full Code  Family Communication: Spoke with the patient's son at bedside again today.  Consultants: General Surgery  Procedures: NG tube placement  Anti-infectives:  Cefepime  and Flagyl   Anti-infectives (From admission, onward)    Start  Dose/Rate Route Frequency Ordered Stop   06/03/24 1130  vancomycin  (VANCOCIN ) IVPB 1000 mg/200 mL premix  Status:  Discontinued        1,000 mg 200 mL/hr over 60 Minutes  Intravenous Every 24 hours 06/02/24 1149 06/03/24 0949   06/03/24 0000  ceFEPIme  (MAXIPIME ) 2 g in sodium chloride  0.9 % 100 mL IVPB        2 g 200 mL/hr over 30 Minutes Intravenous Every 12 hours 06/02/24 1149 06/08/24 2359   06/02/24 2200  metroNIDAZOLE  (FLAGYL ) IVPB 500 mg        500 mg 100 mL/hr over 60 Minutes Intravenous Every 12 hours 06/02/24 1113 06/09/24 2159   06/02/24 1100  ceFEPIme  (MAXIPIME ) 2 g in sodium chloride  0.9 % 100 mL IVPB        2 g 200 mL/hr over 30 Minutes Intravenous  Once 06/02/24 1045 06/02/24 1449   06/02/24 1100  vancomycin  (VANCOCIN ) IVPB 1000 mg/200 mL premix        1,000 mg 200 mL/hr over 60 Minutes Intravenous  Once 06/02/24 1045 06/02/24 1233   06/02/24 1100  metroNIDAZOLE  (FLAGYL ) IVPB 500 mg        500 mg 100 mL/hr over 60 Minutes Intravenous  Once 06/02/24 1045 06/02/24 1818        Subjective: Today, patient was seen and examined at bedside.  Patient's son at bedside.  Has had a better night yesterday and slept quite a bit after putting an NG tube with significant output.  Still has greenish bile output.  Patient poor historian due to dementia.  Has had a small bowel movement as per the son at bedside.  Objective: Vitals:   06/05/24 2009 06/06/24 0503  BP: (!) 156/99 (!) 133/97  Pulse: 86 92  Resp: 18 18  Temp: 99.1 F (37.3 C) 98.7 F (37.1 C)  SpO2:  92%    Intake/Output Summary (Last 24 hours) at 06/06/2024 0859 Last data filed at 06/06/2024 0700 Gross per 24 hour  Intake 966.25 ml  Output 1650 ml  Net -683.75 ml   Filed Weights   06/02/24 0812  Weight: 61.3 kg   Body mass index is 24.72 kg/m.   Physical Exam:  General:  Average built, not in obvious distress, elderly female, underlying dementia, minimally interactive, more alert today. HENT:   No scleral pallor or icterus noted. Oral mucosa is moist.  NG tube in place with bilious fluid. Chest:  Clear breath sounds. . No crackles or wheezes.  CVS: S1 &S2 heard. No murmur.   Regular rate and rhythm. Abdomen: Soft, distended abdomen tympanic with bowel sounds, mild tenderness diffuse on palpation..   Extremities: No cyanosis, clubbing or edema.  Peripheral pulses are palpable. Psych: Alert, awake and significant cognitive dysfunction, CNS:  No cranial nerve deficits.  Moves all extremities Skin: Warm and dry.  No rashes noted.   Data Review: I have personally reviewed the following laboratory data and studies,  CBC: Recent Labs  Lab 06/02/24 1014 06/03/24 0557 06/04/24 0544 06/05/24 0736 06/06/24 0611  WBC 13.7* 13.9* 7.8 4.2 8.8  NEUTROABS 11.9* 11.9*  --   --   --   HGB 10.5* 12.6 12.1 10.7* 12.6  HCT 32.6* 40.7 37.3 35.2* 39.6  MCV 91.6 93.3 92.1 93.9 93.4  PLT 188 219 275 252 307   Basic Metabolic Panel: Recent Labs  Lab 06/01/24 0230 06/02/24 0908 06/03/24 0557 06/04/24 0544 06/05/24 0736 06/06/24 0611  NA  --  136 141 138 135 139  K  --  4.1 3.7 3.3* 3.4* 3.1*  CL  --  103 108 105 106 104  CO2  --  24 22 23 22 25   GLUCOSE  --  96 96 112* 297* 107*  BUN  --  26* 17 15 11 11   CREATININE  --  0.88 0.65 0.65 0.45 0.53  CALCIUM   --  9.4 8.9 8.4* 7.9* 7.7*  MG 2.1  --   --  1.9 1.8 1.8   Liver Function Tests: Recent Labs  Lab 05/31/24 2054 06/02/24 0908 06/03/24 0557  AST 27 26 17   ALT 27 21 16   ALKPHOS 70 76 77  BILITOT 0.9 1.4* 1.1  PROT 6.5 6.3* 5.8*  ALBUMIN  3.8 3.4* 3.1*   Recent Labs  Lab 05/31/24 2054  LIPASE 27   No results for input(s): AMMONIA in the last 168 hours. Cardiac Enzymes: No results for input(s): CKTOTAL, CKMB, CKMBINDEX, TROPONINI in the last 168 hours. BNP (last 3 results) No results for input(s): BNP in the last 8760 hours.  ProBNP (last 3 results) No results for input(s): PROBNP in the last 8760 hours.  CBG: No results for input(s): GLUCAP in the last 168 hours. Recent Results (from the past 240 hours)  Blood Culture (routine x 2)     Status: Abnormal   Collection Time:  06/01/24  2:03 AM   Specimen: BLOOD  Result Value Ref Range Status   Specimen Description   Final    BLOOD RIGHT ANTECUBITAL Performed at [facility], [address], [place], [place] [address]    Special Requests   Final    BOTTLES DRAWN AEROBIC AND ANAEROBIC Blood Culture results may not be optimal due to an inadequate volume of blood received in culture bottles Performed at [facility], [address] [place], [place] [address]    Culture  Setup Time   Final    GRAM POSITIVE COCCI IN CLUSTERS AEROBIC BOTTLE ONLY CRITICAL RESULT CALLED TO, READ BACK BY AND VERIFIED WITH: RN MARVAL W [id number] FCP    Culture (A)  Final    STAPHYLOCOCCUS HOMINIS THE SIGNIFICANCE OF ISOLATING THIS ORGANISM FROM A SINGLE SET OF BLOOD CULTURES WHEN MULTIPLE SETS ARE DRAWN IS UNCERTAIN. PLEASE NOTIFY THE MICROBIOLOGY DEPARTMENT WITHIN ONE WEEK IF SPECIATION AND SENSITIVITIES ARE REQUIRED. Performed at [facility], [address], [place], [place] [address]    Report Status 06/03/2024 FINAL  Final  Blood Culture ID Panel (Reflexed)     Status: Abnormal   Collection Time: 06/01/24  2:03 AM  Result Value Ref Range Status   Enterococcus faecalis NOT DETECTED NOT DETECTED Final   Enterococcus Faecium NOT DETECTED NOT DETECTED Final   Listeria monocytogenes NOT DETECTED NOT DETECTED Final   Staphylococcus species DETECTED (A) NOT DETECTED Final    Comment: CRITICAL RESULT CALLED TO, READ BACK BY AND VERIFIED WITH: RN MARVAL ORN [id number] FCP    Staphylococcus aureus (BCID) NOT DETECTED NOT DETECTED Final   Staphylococcus epidermidis NOT DETECTED NOT DETECTED Final   Staphylococcus lugdunensis NOT DETECTED NOT DETECTED Final   Streptococcus species NOT DETECTED NOT DETECTED Final   Streptococcus agalactiae NOT DETECTED NOT DETECTED Final   Streptococcus pneumoniae NOT DETECTED NOT DETECTED Final   Streptococcus pyogenes NOT DETECTED NOT DETECTED Final    A.calcoaceticus-baumannii NOT DETECTED NOT DETECTED Final   Bacteroides fragilis NOT DETECTED NOT DETECTED Final   Enterobacterales NOT DETECTED NOT DETECTED Final   Enterobacter cloacae complex NOT  DETECTED NOT DETECTED Final   Escherichia coli NOT DETECTED NOT DETECTED Final   Klebsiella aerogenes NOT DETECTED NOT DETECTED Final   Klebsiella oxytoca NOT DETECTED NOT DETECTED Final   Klebsiella pneumoniae NOT DETECTED NOT DETECTED Final   Proteus species NOT DETECTED NOT DETECTED Final   Salmonella species NOT DETECTED NOT DETECTED Final   Serratia marcescens NOT DETECTED NOT DETECTED Final   Haemophilus influenzae NOT DETECTED NOT DETECTED Final   Neisseria meningitidis NOT DETECTED NOT DETECTED Final   Pseudomonas aeruginosa NOT DETECTED NOT DETECTED Final   Stenotrophomonas maltophilia NOT DETECTED NOT DETECTED Final   Candida albicans NOT DETECTED NOT DETECTED Final   Candida auris NOT DETECTED NOT DETECTED Final   Candida glabrata NOT DETECTED NOT DETECTED Final   Candida krusei NOT DETECTED NOT DETECTED Final   Candida parapsilosis NOT DETECTED NOT DETECTED Final   Candida tropicalis NOT DETECTED NOT DETECTED Final   Cryptococcus neoformans/gattii NOT DETECTED NOT DETECTED Final    Comment: Performed at [facility], [address], [place], [place] [address]  Blood Culture (routine x 2)     Status: None   Collection Time: 06/01/24  2:08 AM   Specimen: BLOOD  Result Value Ref Range Status   Specimen Description   Final    BLOOD BLOOD RIGHT HAND Performed at [facility], [address], [place], [place] [address]    Special Requests   Final    BOTTLES DRAWN AEROBIC AND ANAEROBIC Blood Culture results may not be optimal due to an inadequate volume of blood received in culture bottles Performed at [facility], [address], [place], [place] [address]    Culture   Final    NO GROWTH 5 DAYS Performed at [facility],  [address], [place], [place] [address]    Report Status 06/06/2024 FINAL  Final  Resp panel by RT-PCR (RSV, Flu A&B, Covid) Anterior Nasal Swab     Status: None   Collection Time: 06/01/24  2:30 AM   Specimen: Anterior Nasal Swab  Result Value Ref Range Status   SARS Coronavirus 2 by RT PCR NEGATIVE NEGATIVE Final    Comment: (NOTE) SARS-CoV-2 target nucleic acids are NOT DETECTED.  The SARS-CoV-2 RNA is generally detectable in upper respiratory specimens during the acute phase of infection. The lowest concentration of SARS-CoV-2 viral copies this assay can detect is 138 copies/mL. A negative result does not preclude SARS-Cov-2 infection and should not be used as the sole basis for treatment or other patient management decisions. A negative result may occur with  improper specimen collection/handling, submission of specimen other than nasopharyngeal swab, presence of viral mutation(s) within the areas targeted by this assay, and inadequate number of viral copies(<138 copies/mL). A negative result must be combined with clinical observations, patient history, and epidemiological information. The expected result is Negative.  Fact Sheet for Patients:  BloggerCourse.com  Fact Sheet for Healthcare Providers:  SeriousBroker.it  This test is no t yet approved or cleared by the [place]  FDA and  has been authorized for detection and/or diagnosis of SARS-CoV-2 by FDA under an Emergency Use Authorization (EUA). This EUA will remain  in effect (meaning this test can be used) for the duration of the COVID-19 declaration under Section 564(b)(1) of the Act, 21 U.S.C.section 360bbb-3(b)(1), unless the authorization is terminated  or revoked sooner.       Influenza A by PCR NEGATIVE NEGATIVE Final   Influenza B by PCR  NEGATIVE NEGATIVE Final    Comment: (NOTE) The Xpert Xpress SARS-CoV-2/FLU/RSV plus assay is intended as an aid in the  diagnosis of influenza from Nasopharyngeal swab specimens and should not be used as a sole basis for treatment. Nasal washings and aspirates are unacceptable for Xpert Xpress SARS-CoV-2/FLU/RSV testing.  Fact Sheet for Patients: BloggerCourse.com  Fact Sheet for Healthcare Providers: SeriousBroker.it  This test is not yet approved or cleared by the [place]  FDA and has been authorized for detection and/or diagnosis of SARS-CoV-2 by FDA under an Emergency Use Authorization (EUA). This EUA will remain in effect (meaning this test can be used) for the duration of the COVID-19 declaration under Section 564(b)(1) of the Act, 21 U.S.C. section 360bbb-3(b)(1), unless the authorization is terminated or revoked.     Resp Syncytial Virus by PCR NEGATIVE NEGATIVE Final    Comment: (NOTE) Fact Sheet for Patients: BloggerCourse.com  Fact Sheet for Healthcare Providers: SeriousBroker.it  This test is not yet approved or cleared by the [place]  FDA and has been authorized for detection and/or diagnosis of SARS-CoV-2 by FDA under an Emergency Use Authorization (EUA). This EUA will remain in effect (meaning this test can be used) for the duration of the COVID-19 declaration under Section 564(b)(1) of the Act, 21 U.S.C. section 360bbb-3(b)(1), unless the authorization is terminated or revoked.  Performed at [facility], [address], [place], [place] [address]   Blood Culture (routine x 2)     Status: None (Preliminary result)   Collection Time: 06/02/24  8:35 AM   Specimen: BLOOD  Result Value Ref Range Status   Specimen Description   Final    BLOOD RIGHT ANTECUBITAL Performed at [facility], [address], [place], [place] [address]    Special Requests   Final    BOTTLES DRAWN AEROBIC AND ANAEROBIC Blood Culture adequate  volume Performed at [facility], [address] [place], [place] [address]    Culture   Final    NO GROWTH 4 DAYS Performed at [facility], [address] [place], [place] [address]    Report Status PENDING  Incomplete  Resp panel by RT-PCR (RSV, Flu A&B, Covid) Anterior Nasal Swab     Status: None   Collection Time: 06/02/24 10:14 AM   Specimen: Anterior Nasal Swab  Result Value Ref Range Status   SARS Coronavirus 2 by RT PCR NEGATIVE NEGATIVE Final    Comment: (NOTE) SARS-CoV-2 target nucleic acids are NOT DETECTED.  The SARS-CoV-2 RNA is generally detectable in upper respiratory specimens during the acute phase of infection. The lowest concentration of SARS-CoV-2 viral copies this assay can detect is 138 copies/mL. A negative result does not preclude SARS-Cov-2 infection and should not be used as the sole basis for treatment or other patient management decisions. A negative result may occur with  improper specimen collection/handling, submission of specimen other than nasopharyngeal swab, presence of viral mutation(s) within the areas targeted by this assay, and inadequate number of viral copies(<138 copies/mL). A negative result must be combined with clinical observations, patient history, and epidemiological information. The expected result is Negative.  Fact Sheet for Patients:  BloggerCourse.com  Fact Sheet for Healthcare Providers:  SeriousBroker.it  This test is no t yet approved or cleared by the [place]  FDA and  has been authorized for detection and/or diagnosis of SARS-CoV-2 by FDA under an Emergency Use Authorization (EUA). This EUA will remain  in effect (meaning this test can  be used) for the duration of the COVID-19 declaration under Section 564(b)(1) of the Act, 21 U.S.C.section 360bbb-3(b)(1), unless the authorization is terminated  or revoked sooner.       Influenza A by  PCR NEGATIVE NEGATIVE Final   Influenza B by PCR NEGATIVE NEGATIVE Final    Comment: (NOTE) The Xpert Xpress SARS-CoV-2/FLU/RSV plus assay is intended as an aid in the diagnosis of influenza from Nasopharyngeal swab specimens and should not be used as a sole basis for treatment. Nasal washings and aspirates are unacceptable for Xpert Xpress SARS-CoV-2/FLU/RSV testing.  Fact Sheet for Patients: BloggerCourse.com  Fact Sheet for Healthcare Providers: SeriousBroker.it  This test is not yet approved or cleared by the [place]  FDA and has been authorized for detection and/or diagnosis of SARS-CoV-2 by FDA under an Emergency Use Authorization (EUA). This EUA will remain in effect (meaning this test can be used) for the duration of the COVID-19 declaration under Section 564(b)(1) of the Act, 21 U.S.C. section 360bbb-3(b)(1), unless the authorization is terminated or revoked.     Resp Syncytial Virus by PCR NEGATIVE NEGATIVE Final    Comment: (NOTE) Fact Sheet for Patients: BloggerCourse.com  Fact Sheet for Healthcare Providers: SeriousBroker.it  This test is not yet approved or cleared by the [place]  FDA and has been authorized for detection and/or diagnosis of SARS-CoV-2 by FDA under an Emergency Use Authorization (EUA). This EUA will remain in effect (meaning this test can be used) for the duration of the COVID-19 declaration under Section 564(b)(1) of the Act, 21 U.S.C. section 360bbb-3(b)(1), unless the authorization is terminated or revoked.  Performed at [facility], [address], [place], [place] [address]   Blood Culture (routine x 2)     Status: None (Preliminary result)   Collection Time: 06/02/24 12:45 PM   Specimen: BLOOD  Result Value Ref Range Status   Specimen Description   Final    BLOOD BLOOD LEFT ARM Performed at [facility], [address], [place], [place] [address]    Special Requests   Final    BOTTLES DRAWN AEROBIC ONLY Blood Culture adequate volume Performed at [facility], [address], [place], [place] [address]    Culture   Final    NO GROWTH 4 DAYS Performed at [facility], [address], [place], [place] [address]    Report Status PENDING  Incomplete     Studies: DG Abd Portable 1V Result Date: 06/05/2024 CLINICAL DATA:  NG tube placement. EXAM: PORTABLE ABDOMEN - 1 VIEW COMPARISON:  06/04/2024. FINDINGS: Enteric tube tip overlies the expected region of the gastric fundus with side port in the gastric body. Similar gaseous distension of upper abdominal small bowel, suggestive of small bowel obstruction. IMPRESSION: 1. Enteric tube tip overlies the expected region of the gastric fundus with side port in the gastric body. 2. Similar gaseous dilated loops of upper abdominal small bowel, suggestive of small bowel obstruction. Electronically Signed   By: Harrietta Sherry M.D.   On: 06/05/2024 14:01   DG Abd 1 View Result Date: 06/04/2024 CLINICAL DATA:  Abdominal pain.  Nausea and vomiting. EXAM: ABDOMEN - 1 VIEW COMPARISON:  CT abdomen and pelvis 06/01/2024 FINDINGS: New since prior study, there is gaseous distention of upper abdominal small bowel with decreased gas in the colon. This likely indicates small bowel obstruction. No radiopaque stones. Degenerative changes in the spine and hips. Lumbar scoliosis convex towards the right. Lung bases are clear.  IMPRESSION: Gaseous distention of upper abdominal small bowel likely representing small bowel obstruction. Electronically Signed   By: Elsie Gravely M.D.   On: 06/04/2024 19:12      Vernal Alstrom, MD  [facility] 06/06/2024  If 7PM-7AM, please contact night-coverage

## 2024-06-06 NOTE — Plan of Care (Signed)
  Problem: Fluid Volume: Goal: Hemodynamic stability will improve Outcome: Progressing   Problem: Clinical Measurements: Goal: Diagnostic test results will improve Outcome: Progressing   Problem: Education: Goal: Knowledge of General Education information will improve Description: Including pain rating scale, medication(s)/side effects and non-pharmacologic comfort measures Outcome: Progressing   Problem: Clinical Measurements: Goal: Ability to maintain clinical measurements within normal limits will improve Outcome: Progressing Goal: Will remain free from infection Outcome: Progressing Goal: Cardiovascular complication will be avoided Outcome: Progressing   Problem: Activity: Goal: Risk for activity intolerance will decrease Outcome: Progressing   Problem: Coping: Goal: Level of anxiety will decrease Outcome: Progressing   Problem: Pain Managment: Goal: General experience of comfort will improve and/or be controlled Outcome: Progressing   Problem: Skin Integrity: Goal: Risk for impaired skin integrity will decrease Outcome: Progressing

## 2024-06-06 NOTE — Progress Notes (Signed)
 XRAY to come do 8 hr gastrograffin scan around midnight.

## 2024-06-06 NOTE — Progress Notes (Signed)
 Physical Therapy Treatment Patient Details Name: Lindsay Mason MRN: [id number] DOB: 27-Aug-1951 Today's Date: 06/06/2024   History of Present Illness 73 y.o. female admitted with positive blood culture today with unspecified Staphylococcus species, lethargy, nausea/vomiting and worsening mental status. Dx of SIRS, possible diverticulitis. 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  Pt assisted with ambulating in hallway good distance and then prompted to use bathroom prior to return to bed.  Pt's depends from home soiled so removed and donned clean depends, and pt also able to urinate in toilet.  Pt assisted back to bed and mittens reapplied with spouse in room.    If plan is discharge home, recommend the following: A little help with walking and/or transfers;A little help with bathing/dressing/bathroom;Assistance with cooking/housework;Assist for transportation;Direct supervision/assist for medications management;Help with stairs or ramp for entrance;Direct supervision/assist for financial management;Supervision due to cognitive status   Can travel by private vehicle        Equipment Recommendations  None recommended by PT    Recommendations for Other Services       Precautions / Restrictions Precautions Precautions: Fall Recall of Precautions/Restrictions: Impaired Precaution/Restrictions Comments: h/o dementia, NG tube, mittens     Mobility  Bed  Mobility Overal bed mobility: Needs Assistance Bed Mobility: Supine to Sit, Sit to Supine     Supine to sit: Contact guard Sit to supine: Contact guard assist   General bed mobility comments: multimodal cues for technique    Transfers Overall transfer level: Needs assistance Equipment used: 1 person hand held assist Transfers: Sit to/from Stand Sit to Stand: Min assist           General transfer comment: 1 HHA to guide and support pt; reliant on UE support to self assist; assist to rise and stabilize    Ambulation/Gait Ambulation/Gait assistance: Min assist Gait Distance (Feet): 250 Feet Assistive device: 1 person hand held assist Gait Pattern/deviations: Step-through pattern, Decreased stride length, Shuffle Gait velocity: decr     General Gait Details: pt denies pain or symptoms, distance to tolerance   Stairs             Wheelchair Mobility     Tilt Bed    Modified Rankin (Stroke Patients Only)       Balance                                            Communication Communication Communication: No apparent difficulties  Cognition Arousal: Alert Behavior During Therapy: WFL for tasks assessed/performed   PT - Cognitive impairments: History of cognitive impairments                       PT - Cognition Comments: pleasantly confused today Following commands: Impaired Following commands impaired: Follows one step commands with increased time    Cueing Cueing Techniques: Verbal cues, Gestural cues, Tactile cues, Visual cues  Exercises      General Comments  Pertinent Vitals/Pain Pain Assessment Pain Assessment: No/denies pain    Home Living                          Prior Function            PT Goals (current goals can now be found in the care plan section) Progress towards PT goals: Progressing toward goals    Frequency    Min 2X/week      PT Plan      Co-evaluation               AM-PAC PT 6 Clicks Mobility   Outcome Measure  Help needed turning from your back to your side while in a flat bed without using bedrails?: A Little Help needed moving from lying on your back to sitting on the side of a flat bed without using bedrails?: A Little Help needed moving to and from a bed to a chair (including a wheelchair)?: A Little Help needed standing up from a chair using your arms (e.g., wheelchair or bedside chair)?: A Little Help needed to walk in hospital room?: A Little Help needed climbing 3-5 steps with a railing? : A Little 6 Click Score: 18    End of Session Equipment Utilized During Treatment: Gait belt Activity Tolerance: Patient limited by pain Patient left: with call bell/phone within reach;in bed;with bed alarm set;Other (comment) (mittens reapplied) Nurse Communication: Mobility status PT Visit Diagnosis: Difficulty in walking, not elsewhere classified (R26.2)     Time: 1350-1415 PT Time Calculation (min) (ACUTE ONLY): 25 min  Charges:    $Gait Training: 23-37 mins PT General Charges $$ ACUTE PT VISIT: 1 Visit                     Lindsay Mason, DPT Physical Therapist Acute Rehabilitation Services Office: [phone]  Lindsay CROME [place] 06/06/2024, 4:29 PM

## 2024-06-06 NOTE — Progress Notes (Signed)
 Patient ID: Lindsay Mason, female   DOB: 20-Jun-1951, 73 y.o.   MRN: [id number]   Acute Care Surgery Service Progress Note:    Chief Complaint/Subjective: Son at [facility] - he provides history Small bowel movement He felt that his mom felt better after NG tube placement About 1650 of bilious output  Objective: Vital signs in last 24 hours: Temp:  [98.3 F (36.8 C)-99.1 F (37.3 C)] 98.7 F (37.1 C) (08/24 0503) Pulse Rate:  [81-92] 92 (08/24 0503) Resp:  [15-18] 18 (08/24 0503) BP: (133-156)/(97-100) 133/97 (08/24 0503) SpO2:  [92 %-96 %] 92 % (08/24 0503) Last BM Date : 06/05/24  Intake/Output from previous day: 08/23 0701 - 08/24 0700 In: 966.3 [I.V.:566.3; IV Piggyback:400] Out: 1650 [Emesis/NG output:1650] Intake/Output this shift: No intake/output data recorded.  Lungs: , nonlabored  Cardiovascular: reg  Abd: Soft, still distended but really nontender  Extremities: no edema, +SCDs  Neuro: alert, nonfocal  Lab Results: CBC  Recent Labs    06/05/24 0736 06/06/24 0611  WBC 4.2 8.8  HGB 10.7* 12.6  HCT 35.2* 39.6  PLT 252 307   BMET Recent Labs    06/05/24 0736 06/06/24 0611  NA 135 139  K 3.4* 3.1*  CL 106 104  CO2 22 25  GLUCOSE 297* 107*  BUN 11 11  CREATININE 0.45 0.53  CALCIUM  7.9* 7.7*   LFT    Latest Ref Rng & Units 06/03/2024    5:57 AM 06/02/2024    9:08 AM 05/31/2024    8:54 PM  Hepatic Function  Total Protein 6.5 - 8.1 g/dL 5.8  6.3  6.5   Albumin  3.5 - 5.0 g/dL 3.1  3.4  3.8   AST 15 - 41 U/L 17  26  27    ALT 0 - 44 U/L 16  21  27    Alk Phosphatase 38 - 126 U/L 77  76  70   Total Bilirubin 0.0 - 1.2 mg/dL 1.1  1.4  0.9    PT/INR No results for input(s): LABPROT, INR in the last 72 hours. ABG No results for input(s): PHART, HCO3 in the last 72 hours.  Invalid input(s): PCO2, PO2  Studies/Results:  Anti-infectives: Anti-infectives (From admission, onward)    Start     Dose/Rate Route Frequency Ordered Stop    06/03/24 1130  vancomycin  (VANCOCIN ) IVPB 1000 mg/200 mL premix  Status:  Discontinued        1,000 mg 200 mL/hr over 60 Minutes Intravenous Every 24 hours 06/02/24 1149 06/03/24 0949   06/03/24 0000  ceFEPIme  (MAXIPIME ) 2 g in sodium chloride  0.9 % 100 mL IVPB        2 g 200 mL/hr over 30 Minutes Intravenous Every 12 hours 06/02/24 1149 06/08/24 2359   06/02/24 2200  metroNIDAZOLE  (FLAGYL ) IVPB 500 mg        500 mg 100 mL/hr over 60 Minutes Intravenous Every 12 hours 06/02/24 1113 06/09/24 2159   06/02/24 1100  ceFEPIme  (MAXIPIME ) 2 g in sodium chloride  0.9 % 100 mL IVPB        2 g 200 mL/hr over 30 Minutes Intravenous  Once 06/02/24 1045 06/02/24 1449   06/02/24 1100  vancomycin  (VANCOCIN ) IVPB 1000 mg/200 mL premix        1,000 mg 200 mL/hr over 60 Minutes Intravenous  Once 06/02/24 1045 06/02/24 1233   06/02/24 1100  metroNIDAZOLE  (FLAGYL ) IVPB 500 mg        500 mg 100 mL/hr over 60 Minutes Intravenous  Once 06/02/24 1045 06/02/24 1818       Medications: Scheduled Meds:  atorvastatin   20 mg Oral Daily   diatrizoate  meglumine -sodium  90 mL Per NG tube Once   feeding supplement  1 Container Oral TID BM   LORazepam   0.5 mg Intravenous Once   nortriptyline   25 mg Oral QHS   rivastigmine   9.5 mg Transdermal Daily   traMADol   50 mg Oral Once   Continuous Infusions:  ceFEPime  (MAXIPIME ) IV 2 g (06/06/24 0113)   dextrose  5 % and 0.45 % NaCl 75 mL/hr at 06/05/24 1526   metronidazole  500 mg (06/06/24 0937)   potassium chloride  10 mEq (06/06/24 0926)   PRN Meds:.acetaminophen  **OR** acetaminophen , albuterol , famotidine , ondansetron  **OR** ondansetron  (ZOFRAN ) IV, traZODone   Assessment/Plan: Patient Active Problem List   Diagnosis Date Noted   SIRS (systemic inflammatory response syndrome) (HCC) 06/02/2024   Positive blood culture 06/02/2024   Nausea and vomiting 06/02/2024   Abdominal pain 06/02/2024   Hyperlipidemia 06/02/2024   Abnormal EKG 06/02/2024   Transaminitis  05/09/2024   Confusion and disorientation 03/02/2024   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 03/02/2024   Fall with injury 02/05/2024   Multiple closed fractures of ribs of left side with routine healing 02/05/2024   Urinary retention 02/05/2024   Non-small cell carcinoma of left lung, stage 1 (HCC) 01/20/2024   Pneumothorax 01/10/2024   PAD (peripheral artery disease) (HCC) 12/06/2023   UTI due to extended-spectrum beta lactamase (ESBL) producing Escherichia coli 12/06/2023   Acid reflux 11/06/2023   Sundowning 11/06/2023   Medicare annual wellness visit, subsequent 05/01/2022   Vitamin B12 deficiency 01/22/2021   History of essential hypertension 01/21/2021   Severe early onset Alzheimer dementia (HCC) 01/21/2021   History of primary malignant neoplasm of right lung 01/20/2020   S/P lobectomy of lung 01/20/2020   Advanced care planning/counseling discussion 06/26/2017   Degenerative scoliosis 06/26/2017   COPD with emphysema (HCC) 02/12/2016   Thoracic aortic atherosclerosis (HCC) 02/12/2016   Health maintenance examination 02/01/2016   Weight loss 02/01/2016   Microscopic colitis 06/05/2015   Abnormal MRI of head 10/02/2011   Headache 08/06/2011   Ex-smoker 04/30/2007   Osteopenia 04/30/2007   Lindsay Mason is an 73 y.o. female with  Nausea/vomiting/abdominal distention Ileus versus partial SBO Early onset Alzheimer's dementia COPD with emphysema Hyperlipidemia PAD History of hypertension     Gi-continue NG tube, we will do our small bowel obstruction protocol.  If fails small bowel obstruction protocol then I would repeat CT scan at that point   FEN: Correct electrolyte abnormalities, hypokalemia recommend potassium greater than 4, keep mag 2 or greater   VTE prophylaxis: SCDs, okay with Lovenox /subcu heparin     Data reviewed: Vitals for the past 24 hours, ins and outs for 24 hours, hospitalist note, diagnostic x-rays x 2  Moderate MDM  LOS: 4 days     Camellia HERO. Tanda, MD, FACS General, Bariatric, & Minimally Invasive Surgery [phone] [facility]

## 2024-06-06 NOTE — Plan of Care (Signed)
  Problem: Fluid Volume: Goal: Hemodynamic stability will improve Outcome: Progressing   Problem: Clinical Measurements: Goal: Diagnostic test results will improve Outcome: Progressing Goal: Signs and symptoms of infection will decrease Outcome: Progressing   Problem: Coping: Goal: Level of anxiety will decrease Outcome: Progressing   Problem: Pain Managment: Goal: General experience of comfort will improve and/or be controlled Outcome: Progressing   Problem: Safety: Goal: Ability to remain free from injury will improve Outcome: Progressing

## 2024-06-06 NOTE — Progress Notes (Signed)
 Gastrograffin administered via NGT. NGT clamped. Delay in administration due to loss of NGT and xray placement verification.

## 2024-06-07 ENCOUNTER — Inpatient Hospital Stay (HOSPITAL_COMMUNITY)

## 2024-06-07 DIAGNOSIS — I739 Peripheral vascular disease, unspecified: Secondary | ICD-10-CM | POA: Diagnosis not present

## 2024-06-07 DIAGNOSIS — J449 Chronic obstructive pulmonary disease, unspecified: Secondary | ICD-10-CM | POA: Diagnosis not present

## 2024-06-07 DIAGNOSIS — K567 Ileus, unspecified: Secondary | ICD-10-CM | POA: Diagnosis not present

## 2024-06-07 DIAGNOSIS — R6510 Systemic inflammatory response syndrome (SIRS) of non-infectious origin without acute organ dysfunction: Secondary | ICD-10-CM | POA: Diagnosis not present

## 2024-06-07 DIAGNOSIS — E785 Hyperlipidemia, unspecified: Secondary | ICD-10-CM | POA: Diagnosis not present

## 2024-06-07 LAB — CBC
HCT: 38.1 % (ref 36.0–46.0)
Hemoglobin: 11.8 g/dL — ABNORMAL LOW (ref 12.0–15.0)
MCH: 28.3 pg (ref 26.0–34.0)
MCHC: 31 g/dL (ref 30.0–36.0)
MCV: 91.4 fL (ref 80.0–100.0)
Platelets: 309 K/uL (ref 150–400)
RBC: 4.17 MIL/uL (ref 3.87–5.11)
RDW: 14.6 % (ref 11.5–15.5)
WBC: 7.8 K/uL (ref 4.0–10.5)
nRBC: 0 % (ref 0.0–0.2)

## 2024-06-07 LAB — BASIC METABOLIC PANEL WITH GFR
Anion gap: 6 (ref 5–15)
BUN: 11 mg/dL (ref 8–23)
CO2: 27 mmol/L (ref 22–32)
Calcium: 7.7 mg/dL — ABNORMAL LOW (ref 8.9–10.3)
Chloride: 106 mmol/L (ref 98–111)
Creatinine, Ser: 0.61 mg/dL (ref 0.44–1.00)
GFR, Estimated: >60 mL/min (ref >60)
Glucose, Bld: 99 mg/dL (ref 70–99)
Potassium: 3.1 mmol/L — ABNORMAL LOW (ref 3.5–5.1)
Sodium: 139 mmol/L (ref 135–145)

## 2024-06-07 LAB — CULTURE, BLOOD (ROUTINE X 2)
Culture: NO GROWTH
Culture: NO GROWTH
Special Requests: ADEQUATE
Special Requests: ADEQUATE

## 2024-06-07 LAB — MAGNESIUM: Magnesium: 1.8 mg/dL (ref 1.7–2.4)

## 2024-06-07 MED ORDER — POTASSIUM CHLORIDE 10 MEQ/100ML IV SOLN
10.0000 meq | INTRAVENOUS | Status: AC
  Administered 2024-06-07 (×4): 10 meq via INTRAVENOUS
  Filled 2024-06-07 (×4): qty 100

## 2024-06-07 MED ORDER — METHOCARBAMOL 1000 MG/10ML IJ SOLN
500.0000 mg | Freq: Four times a day (QID) | INTRAMUSCULAR | Status: DC | PRN
  Administered 2024-06-07 – 2024-06-09 (×5): 500 mg via INTRAVENOUS
  Filled 2024-06-07 (×5): qty 10

## 2024-06-07 MED ORDER — PHENOL 1.4 % MT LIQD
1.0000 | OROMUCOSAL | Status: DC | PRN
  Administered 2024-06-07 – 2024-06-23 (×3): 1 via OROMUCOSAL
  Filled 2024-06-07: qty 177

## 2024-06-07 MED ORDER — HYDROCORTISONE 0.5 % EX CREA
TOPICAL_CREAM | Freq: Two times a day (BID) | CUTANEOUS | Status: AC
  Filled 2024-06-07: qty 28.35

## 2024-06-07 MED ORDER — MAGNESIUM SULFATE 2 GM/50ML IV SOLN
2.0000 g | Freq: Once | INTRAVENOUS | Status: AC
  Administered 2024-06-07: 2 g via INTRAVENOUS
  Filled 2024-06-07: qty 50

## 2024-06-07 NOTE — Progress Notes (Signed)
 Physical Therapy Treatment Patient Details Name: Lindsay Mason MRN: [id number] DOB: 02/08/1951 Today's Date: 06/07/2024   History of Present Illness Pt is 73 yo female presented on 06/02/24  with + blood culture with staph species, lethargy, and worsening mental status. Pt admitted with SIRS, abdominal pain, N/V likely secondary to acute diverticulitis, and SBO vs ileus.  Pt with NG tube and surgery following.  Pt also with metabolic encephalopathy on background of Alzheimer's dementia.  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  Pt's NG tube clamped for mobility.  She was able to ambulate in bathroom but after increased time cleaning and trying to further have BM, she began feeling nauseated and small amount of dry heaving so returned to bed and NG tube reconnected.  Notified RN.  Pt ambulated 15'x2 with min A.  Needs mod cues for all transfers.  Continue POC with recommendation for HHPT at d/c.     If plan is discharge home, recommend the following: A little help with walking and/or transfers;A little help with bathing/dressing/bathroom;Assistance with cooking/housework;Assist for transportation;Direct supervision/assist for medications management;Help with stairs or ramp for entrance;Direct supervision/assist for financial management;Supervision due to cognitive status   Can travel by private vehicle        Equipment Recommendations  None recommended by PT    Recommendations for Other Services       Precautions / Restrictions Precautions Precautions: Fall Precaution/Restrictions Comments: NG tube     Mobility  Bed Mobility Overal bed mobility: Needs Assistance Bed Mobility: Supine to Sit, Sit to  Supine     Supine to sit: Min assist Sit to supine: Min assist   General bed mobility comments: min A but mod cues    Transfers Overall transfer level: Needs assistance Equipment used: 1 person hand held assist Transfers: Sit to/from Stand Sit to Stand: Min assist           General transfer comment: Light min A from bed and toielt to stand; mod/max cues for positioning and return to sitting    Ambulation/Gait Ambulation/Gait assistance: Min assist Gait Distance (Feet): 15 Feet (15'x2) Assistive device: 1 person hand held assist Gait Pattern/deviations: Step-through pattern, Decreased stride length Gait velocity: decreased     General Gait Details: Light min A to guide walking; only able to ambulate to/from bathroom limited by loose BM and then nauseated so returned to bed and NG tube hooked back up to intermittent suction   Stairs             Wheelchair Mobility     Tilt Bed    Modified Rankin (Stroke Patients Only)       Balance Overall balance assessment: Needs assistance Sitting-balance support: No upper extremity supported, Feet supported Sitting balance-Leahy Scale: Good     Standing balance support: No upper extremity supported Standing balance-Leahy Scale: Fair Standing balance comment: Could stand without UE support                            Communication Communication Communication: No apparent difficulties  Cognition Arousal: Alert Behavior During Therapy: WFL for tasks assessed/performed   PT - Cognitive impairments: History of cognitive impairments  PT - Cognition Comments: pleasantly confused today; needs increased cues        Cueing    Exercises      General Comments General comments (skin integrity, edema, etc.): Pt with loose BM in Depends.  Also loose stool with cleaning. Cleaned as able but having some continuous loose stool.      Pertinent Vitals/Pain Pain Assessment Pain  Assessment: Faces Faces Pain Scale: Hurts whole lot Pain Location: abdomen Pain Descriptors / Indicators: Discomfort, Grimacing, Moaning, Crying Pain Intervention(s): Limited activity within patient's tolerance, Monitored during session, Repositioned    Home Living                          Prior Function            PT Goals (current goals can now be found in the care plan section) Progress towards PT goals: Progressing toward goals    Frequency    Min 2X/week      PT Plan      Co-evaluation              AM-PAC PT 6 Clicks Mobility   Outcome Measure  Help needed turning from your back to your side while in a flat bed without using bedrails?: A Lot (For all min A but mod/max cues) Help needed moving from lying on your back to sitting on the side of a flat bed without using bedrails?: A Lot Help needed moving to and from a bed to a chair (including a wheelchair)?: A Lot Help needed standing up from a chair using your arms (e.g., wheelchair or bedside chair)?: A Lot Help needed to walk in hospital room?: A Lot Help needed climbing 3-5 steps with a railing? : A Lot 6 Click Score: 12    End of Session Equipment Utilized During Treatment: Gait belt Activity Tolerance: Other (comment) (limited by abdominal pain and nausea) Patient left: with call bell/phone within reach;in bed;with bed alarm set;Other (comment);with family/visitor present Nurse Communication: Mobility status (loose stool) PT Visit Diagnosis: Difficulty in walking, not elsewhere classified (R26.2)     Time: 8293-8271 PT Time Calculation (min) (ACUTE ONLY): 22 min  Charges:    $Gait Training: 8-22 mins PT General Charges $$ ACUTE PT VISIT: 1 Visit                     Benjiman, PT Acute Rehab Services [facility] [phone]    Benjiman VEAR [place] 06/07/2024, 5:44 PM

## 2024-06-07 NOTE — Progress Notes (Signed)
 Progress Note     Subjective: Pt having more BMs, unsure if passing much flatus. Increased abdominal cramping this AM. Still distended. Her son is at bedside and reports she walked once yesterday but has not been up a lot since NGT was placed. She reports she occasionally takes medicine for constipation at home if needed but not able to clarify how often she generally has BMs.   Objective: Vital signs in last 24 hours: Temp:  [99.1 F (37.3 C)-99.9 F (37.7 C)] 99.1 F (37.3 C) (08/25 0629) Pulse Rate:  [83-95] 83 (08/25 0629) Resp:  [18] 18 (08/25 0629) BP: (135-137)/(100-103) 137/103 (08/25 0629) SpO2:  [93 %] 93 % (08/25 0629) Last BM Date : 06/06/24  Intake/Output from previous day: 08/24 0701 - 08/25 0700 In: 2104.6 [P.O.:60; I.V.:1414.6; NG/GT:30; IV Piggyback:600] Out: 1500 [Urine:500; Emesis/NG output:400; Stool:600] Intake/Output this shift: No intake/output data recorded.  PE: General: pleasant, WD, WN female who is laying in bed in NAD Heart: regular, rate, and rhythm.  Lungs: Respiratory effort nonlabored Abd: soft, distended, mild generalized TTP without peritonitis, NGT present and thin bilious drainage in cannister    Lab Results:  Recent Labs    06/06/24 0611 06/07/24 0633  WBC 8.8 7.8  HGB 12.6 11.8*  HCT 39.6 38.1  PLT 307 309   BMET Recent Labs    06/06/24 0611 06/07/24 0633  NA 139 139  K 3.1* 3.1*  CL 104 106  CO2 25 27  GLUCOSE 107* 99  BUN 11 11  CREATININE 0.53 0.61  CALCIUM  7.7* 7.7*   PT/INR No results for input(s): LABPROT, INR in the last 72 hours. CMP     Component Value Date/Time   NA 139 06/07/2024 0633   NA 144 03/02/2024 1554   K 3.1 (L) 06/07/2024 0633   CL 106 06/07/2024 0633   CO2 27 06/07/2024 0633   GLUCOSE 99 06/07/2024 0633   BUN 11 06/07/2024 0633   BUN 20 03/02/2024 1554   CREATININE 0.61 06/07/2024 0633   CALCIUM  7.7 (L) 06/07/2024 0633   PROT 5.8 (L) 06/03/2024 0557   PROT 6.1 03/02/2024 1554    ALBUMIN  3.1 (L) 06/03/2024 0557   ALBUMIN  3.9 03/02/2024 1554   AST 17 06/03/2024 0557   ALT 16 06/03/2024 0557   ALKPHOS 77 06/03/2024 0557   BILITOT 1.1 06/03/2024 0557   BILITOT 0.2 03/02/2024 1554   GFRNONAA >60 06/07/2024 0633   GFRAA >60 01/22/2020 0226   Lipase     Component Value Date/Time   LIPASE 27 05/31/2024 2054       Studies/Results: DG Abd Portable 1V-Small Bowel Obstruction Protocol-initial, 8 hr delay Result Date: 06/07/2024 CLINICAL DATA:  8 hour post enteric contrast small-bowel obstruction follow-up exam. EXAM: PORTABLE ABDOMEN - 1 VIEW COMPARISON:  Portable abdomen yesterday 10:32 a.m., portable abdomen 06/05/2024 FINDINGS: 12:21 a.m. NGT curves to the left and up within the stomach with the tip in the proximal fundus. Small volume of enteric contrast remains in the gastric fundus and neck trauma. Faint dilute contrast partially opacifies dilated small bowel. There are some decompressed segments in true pelvis but elsewhere the small bowel is diffusely dilated, with little change over the previous 3 days films up to 4.8 cm. There is scattered barium precipitate in left hemiabdomen. Faint contrast seen in the cecum, proximal ascending colon and is present as far distally as distal descending colon. There is no supine evidence of free air. Small pleural effusions appear similar. Dextroscoliosis and degenerative change  lumbar spine osteopenia. IMPRESSION: 1. Persistent diffuse small bowel dilatation with little change over the previous 3 days. 2. Faint contrast seen in the cecum, proximal ascending colon and distal descending colon. 3. Small pleural effusions appear similar. 4. NGT curves to the left and up within the stomach with the tip in the proximal fundus. Electronically Signed   By: Francis Quam M.D.   On: 06/07/2024 00:42   DG Abd 1 View Result Date: 06/06/2024 CLINICAL DATA:  NG placement. EXAM: ABDOMEN - 1 VIEW COMPARISON:  Radiograph dated 06/05/2024.  FINDINGS: Enteric tube with tip in the left upper abdomen in similar position in the region of the gastric fundus. Dilated small bowel loops again noted. IMPRESSION: Enteric tube with tip in the region of the gastric fundus. Electronically Signed   By: Vanetta Chou M.D.   On: 06/06/2024 14:34   DG Abd Portable 1V Result Date: 06/05/2024 CLINICAL DATA:  NG tube placement. EXAM: PORTABLE ABDOMEN - 1 VIEW COMPARISON:  06/04/2024. FINDINGS: Enteric tube tip overlies the expected region of the gastric fundus with side port in the gastric body. Similar gaseous distension of upper abdominal small bowel, suggestive of small bowel obstruction. IMPRESSION: 1. Enteric tube tip overlies the expected region of the gastric fundus with side port in the gastric body. 2. Similar gaseous dilated loops of upper abdominal small bowel, suggestive of small bowel obstruction. Electronically Signed   By: Harrietta Sherry M.D.   On: 06/05/2024 14:01    Anti-infectives: Anti-infectives (From admission, onward)    Start     Dose/Rate Route Frequency Ordered Stop   06/03/24 1130  vancomycin  (VANCOCIN ) IVPB 1000 mg/200 mL premix  Status:  Discontinued        1,000 mg 200 mL/hr over 60 Minutes Intravenous Every 24 hours 06/02/24 1149 06/03/24 0949   06/03/24 0000  ceFEPIme  (MAXIPIME ) 2 g in sodium chloride  0.9 % 100 mL IVPB        2 g 200 mL/hr over 30 Minutes Intravenous Every 12 hours 06/02/24 1149 06/08/24 2359   06/02/24 2200  metroNIDAZOLE  (FLAGYL ) IVPB 500 mg        500 mg 100 mL/hr over 60 Minutes Intravenous Every 12 hours 06/02/24 1113 06/09/24 2159   06/02/24 1100  ceFEPIme  (MAXIPIME ) 2 g in sodium chloride  0.9 % 100 mL IVPB        2 g 200 mL/hr over 30 Minutes Intravenous  Once 06/02/24 1045 06/02/24 1449   06/02/24 1100  vancomycin  (VANCOCIN ) IVPB 1000 mg/200 mL premix        1,000 mg 200 mL/hr over 60 Minutes Intravenous  Once 06/02/24 1045 06/02/24 1233   06/02/24 1100  metroNIDAZOLE  (FLAGYL ) IVPB 500  mg        500 mg 100 mL/hr over 60 Minutes Intravenous  Once 06/02/24 1045 06/02/24 1818        Assessment/Plan Ileus vs PSBO - CT AP 8/19 with no acute findings - KUB 8/22 with small bowel distention concerning for SBO - NGT placed and large volume decompressed  - SBO protocol given and 8h delay with contrast in colon but persistent small bowel dilation - more suggestive of motility issue  - repeat film at the 24h delay mark this afternoon - ok to clamp NGT to mobilize but would keep to LIWS - keep K>4.0, Mg >2.0 and Phos >3.0 to optimize bowel function  - no indication for emergent surgical intervention at this time  FEN: NPO, IVF per [facility], NGT to LIWS VTE:  ok to have SQH or LMWH from surgical standpoint ID: [email or web address]  for bacteremia   - per [facility] -  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  This care required moderate level of medical decision making.    Burnard JONELLE Louder, [facility] 06/07/2024, 9:34 AM Please see Amion for pager number during day hours 7:00am-4:30pm

## 2024-06-07 NOTE — Plan of Care (Signed)

## 2024-06-07 NOTE — Progress Notes (Addendum)
 PROGRESS NOTE  DEVON KINGDON FMW:[id number] DOB: 1951/04/10 DOA: 06/02/2024 PCP: [name], MD   LOS: 5 days   Brief narrative:  SAMMANTHA MEHLHAFF is a 73 y.o. 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  Patient had lethargy and worsening mental status.  She initially presented on 05/31/2024 with 2-year history of abdominal pain nausea and episodes of vomiting.  In the ED, patient had slightly low blood pressure.  Labs were notable for WBC elevated at 13.7.  COVID RSV and influenza was negative lactic acid was normal.  Urinalysis showed no evidence of infection. 2 view chest x-ray on 05/31/2024 without infiltrate.  CT abdomen/pelvis done on 06/01/2024 with no acute findings but scattered colonic diverticulosis with small diverticulitis.  Patient was then considered for admission to the hospital for further evaluation and treatment.   Assessment/Plan: Principal Problem:   SIRS (systemic inflammatory response syndrome) (HCC) Active Problems:   COPD with emphysema (HCC)   Thoracic aortic atherosclerosis (HCC)   History of essential hypertension   Severe early onset Alzheimer dementia (HCC)   PAD (peripheral artery disease) (HCC)   Confusion and disorientation   Positive blood culture   Nausea and vomiting   Abdominal pain   Hyperlipidemia   Abnormal EKG   Abdominal pain/  Nausea and vomiting likely secondary to acute diverticulitis.   SIRS (systemic inflammatory response syndrome) Small bowel obstruction/ileus CT scan of the abdomen initially was negative.  On  cefepime  and metronidazole .  Blood cultures negative in  5 days.  General surgery following at this time for ileus small bowel obstruction.  Has  had bowel movements yesterday still on NG tube but with decreased output.  Still complains of mild distention and abdominal pain.  General surgery has plans for repeating abdominal x-ray today.  Continue current level of care.  Follow surgical recommendation.  Continue IV hydration.   Hypokalemia.  Potassium was 3.1 today.  Will continue replacement with IV potassium.  Will aggressively replenish.  Will repeat IV potassium in p.m. as well.  Metabolic encephalopathy on the background of Alzheimer's dementia.   Likely secondary to infection.  On nortriptyline  trazodone  Exelon .  More alert awake and communicative today.  Needed some mittens to prevent from pulling lines.     Positive blood culture with staph species. Contamination suspected.  Vancomycin  was discontinued.     COPD with emphysema  Continue albuterol  inhaler.  Appears compensated     Hyperlipidemia   PAD (peripheral artery disease)    Thoracic aortic atherosclerosis Continue Lipitor     History of essential hypertension Currently not on any medication.  As needed IV antihypertensive is necessary.  Weakness, deconditioning.  Seen by PT and recommend home health PT on discharge.  DVT prophylaxis: SCDs Start: 06/02/24 1114   Disposition: Home with home health likely in 1 to 2 days when improved  Status is: Inpatient Remains inpatient appropriate because: Pending clinical improvement, IV antibiotics, physical follow-up, NG tube, IV fluids,    Code Status:     Code Status: Full Code  Family Communication: Spoke with the patient's son at bedside again today.  Consultants: General Surgery  Procedures: NG tube placement  Anti-infectives:  Cefepime  and Flagyl  IV  Anti-infectives (From admission, onward)    Start     Dose/Rate Route  Frequency Ordered Stop   06/03/24 1130  vancomycin  (VANCOCIN ) IVPB 1000 mg/200 mL premix  Status:  Discontinued        1,000 mg 200 mL/hr over 60 Minutes Intravenous Every 24 hours  06/02/24 1149 06/03/24 0949   06/03/24 0000  ceFEPIme  (MAXIPIME ) 2 g in sodium chloride  0.9 % 100 mL IVPB        2 g 200 mL/hr over 30 Minutes Intravenous Every 12 hours 06/02/24 1149 06/08/24 2359   06/02/24 2200  metroNIDAZOLE  (FLAGYL ) IVPB 500 mg        500 mg 100 mL/hr over 60 Minutes Intravenous Every 12 hours 06/02/24 1113 06/09/24 2159   06/02/24 1100  ceFEPIme  (MAXIPIME ) 2 g in sodium chloride  0.9 % 100 mL IVPB        2 g 200 mL/hr over 30 Minutes Intravenous  Once 06/02/24 1045 06/02/24 1449   06/02/24 1100  vancomycin  (VANCOCIN ) IVPB 1000 mg/200 mL premix        1,000 mg 200 mL/hr over 60 Minutes Intravenous  Once 06/02/24 1045 06/02/24 1233   06/02/24 1100  metroNIDAZOLE  (FLAGYL ) IVPB 500 mg        500 mg 100 mL/hr over 60 Minutes Intravenous  Once 06/02/24 1045 06/02/24 1818       Subjective: Today, patient was seen and examined at bedside.  Patient more alert awake and mildly Communicative today.  As per the son at bedside has more abdominal pain but had a good bowel movement yesterday.  Decreased NG output as per nursing staff.  Any shortness of breath dyspnea.  Objective: Vitals:   06/06/24 1937 06/07/24 0629  BP: (!) 135/100 (!) 137/103  Pulse: 95 83  Resp: 18 18  Temp: 99.9 F (37.7 C) 99.1 F (37.3 C)  SpO2: 93% 93%    Intake/Output Summary (Last 24 hours) at 06/07/2024 0926 Last data filed at 06/07/2024 0655 Gross per 24 hour  Intake 2104.63 ml  Output 1500 ml  Net 604.63 ml   Filed Weights   06/02/24 0812  Weight: 61.3 kg   Body mass index is 24.72 kg/m.   Physical Exam:  General:  Average built, not in obvious distress, alert awake and interactive, has underlying cognitive dysfunction.  On mittens HENT:   No scleral pallor or icterus noted. Oral mucosa is moist.  NG tube in place with mild bilious fluid. Chest:  Clear breath sounds. No crackles or wheezes.  CVS: S1 &S2 heard. No murmur.  Regular rate and rhythm. Abdomen: Soft, slightly  distended tympanic abdomen with diffuse tenderness on palpation.  Bowel sounds are heard.   Extremities: No cyanosis, clubbing or edema.  Peripheral pulses are palpable. Psych: Alert, awake with cognitive dysfunction. CNS:  No cranial nerve deficits.  Moving all extremities. Skin: Warm and dry.  No rashes noted.   Data Review: I have personally reviewed the following laboratory data and studies,  CBC: Recent Labs  Lab 06/02/24 1014 06/03/24 0557 06/04/24 0544 06/05/24 0736 06/06/24 0611 06/07/24 0633  WBC 13.7* 13.9* 7.8 4.2 8.8 7.8  NEUTROABS 11.9* 11.9*  --   --   --   --   HGB 10.5* 12.6 12.1 10.7* 12.6 11.8*  HCT 32.6* 40.7 37.3 35.2* 39.6 38.1  MCV 91.6 93.3 92.1 93.9 93.4 91.4  PLT 188 219 275 252 307 309   Basic Metabolic Panel: Recent Labs  Lab 06/01/24 0230 06/02/24 0908 06/03/24 0557 06/04/24 0544 06/05/24 0736 06/06/24 0611 06/07/24 0633  NA  --    < >  141 138 135 139 139  K  --    < > 3.7 3.3* 3.4* 3.1* 3.1*  CL  --    < > 108 105 106 104 106  CO2  --    < > 22 23 22 25 27   GLUCOSE  --    < > 96 112* 297* 107* 99  BUN  --    < > 17 15 11 11 11   CREATININE  --    < > 0.65 0.65 0.45 0.53 0.61  CALCIUM   --    < > 8.9 8.4* 7.9* 7.7* 7.7*  MG 2.1  --   --  1.9 1.8 1.8 1.8   < > = values in this interval not displayed.   Liver Function Tests: Recent Labs  Lab 05/31/24 2054 06/02/24 0908 06/03/24 0557  AST 27 26 17   ALT 27 21 16   ALKPHOS 70 76 77  BILITOT 0.9 1.4* 1.1  PROT 6.5 6.3* 5.8*  ALBUMIN  3.8 3.4* 3.1*   Recent Labs  Lab 05/31/24 2054  LIPASE 27   No results for input(s): AMMONIA in the last 168 hours. Cardiac Enzymes: No results for input(s): CKTOTAL, CKMB, CKMBINDEX, TROPONINI in the last 168 hours. BNP (last 3 results) No results for input(s): BNP in the last 8760 hours.  ProBNP (last 3 results) No results for input(s): PROBNP in the last 8760 hours.  CBG: No results for input(s): GLUCAP in the last 168  hours. Recent Results (from the past 240 hours)  Blood Culture (routine x 2)     Status: Abnormal   Collection Time: 06/01/24  2:03 AM   Specimen: BLOOD  Result Value Ref Range Status   Specimen Description   Final    BLOOD RIGHT ANTECUBITAL Performed at [facility], [address], [place], [place] [address]    Special Requests   Final    BOTTLES DRAWN AEROBIC AND ANAEROBIC Blood Culture results may not be optimal due to an inadequate volume of blood received in culture bottles Performed at [facility], [address] [place], [place] [address]    Culture  Setup Time   Final    GRAM POSITIVE COCCI IN CLUSTERS AEROBIC BOTTLE ONLY CRITICAL RESULT CALLED TO, READ BACK BY AND VERIFIED WITH: RN MARVAL W [id number] FCP    Culture (A)  Final    STAPHYLOCOCCUS HOMINIS THE SIGNIFICANCE OF ISOLATING THIS ORGANISM FROM A SINGLE SET OF BLOOD CULTURES WHEN MULTIPLE SETS ARE DRAWN IS UNCERTAIN. PLEASE NOTIFY THE MICROBIOLOGY DEPARTMENT WITHIN ONE WEEK IF SPECIATION AND SENSITIVITIES ARE REQUIRED. Performed at [facility], [address] [place], [place] [address]    Report Status 06/03/2024 FINAL  Final  Blood Culture ID Panel (Reflexed)     Status: Abnormal   Collection Time: 06/01/24  2:03 AM  Result Value Ref Range Status   Enterococcus faecalis NOT DETECTED NOT DETECTED Final   Enterococcus Faecium NOT DETECTED NOT DETECTED Final   Listeria monocytogenes NOT DETECTED NOT DETECTED Final   Staphylococcus species DETECTED (A) NOT DETECTED Final    Comment: CRITICAL RESULT CALLED TO, READ BACK BY AND VERIFIED WITH: RN MARVAL ORN [id number] FCP    Staphylococcus aureus (BCID) NOT DETECTED NOT DETECTED Final   Staphylococcus epidermidis NOT DETECTED NOT DETECTED Final   Staphylococcus lugdunensis NOT DETECTED NOT DETECTED Final   Streptococcus species NOT DETECTED NOT DETECTED Final   Streptococcus agalactiae NOT DETECTED NOT DETECTED Final    Streptococcus  pneumoniae NOT DETECTED NOT DETECTED Final   Streptococcus pyogenes NOT DETECTED NOT DETECTED Final   A.calcoaceticus-baumannii NOT DETECTED NOT DETECTED Final   Bacteroides fragilis NOT DETECTED NOT DETECTED Final   Enterobacterales NOT DETECTED NOT DETECTED Final   Enterobacter cloacae complex NOT DETECTED NOT DETECTED Final   Escherichia coli NOT DETECTED NOT DETECTED Final   Klebsiella aerogenes NOT DETECTED NOT DETECTED Final   Klebsiella oxytoca NOT DETECTED NOT DETECTED Final   Klebsiella pneumoniae NOT DETECTED NOT DETECTED Final   Proteus species NOT DETECTED NOT DETECTED Final   Salmonella species NOT DETECTED NOT DETECTED Final   Serratia marcescens NOT DETECTED NOT DETECTED Final   Haemophilus influenzae NOT DETECTED NOT DETECTED Final   Neisseria meningitidis NOT DETECTED NOT DETECTED Final   Pseudomonas aeruginosa NOT DETECTED NOT DETECTED Final   Stenotrophomonas maltophilia NOT DETECTED NOT DETECTED Final   Candida albicans NOT DETECTED NOT DETECTED Final   Candida auris NOT DETECTED NOT DETECTED Final   Candida glabrata NOT DETECTED NOT DETECTED Final   Candida krusei NOT DETECTED NOT DETECTED Final   Candida parapsilosis NOT DETECTED NOT DETECTED Final   Candida tropicalis NOT DETECTED NOT DETECTED Final   Cryptococcus neoformans/gattii NOT DETECTED NOT DETECTED Final    Comment: Performed at [facility], [address], [place], [place] [address]  Blood Culture (routine x 2)     Status: None   Collection Time: 06/01/24  2:08 AM   Specimen: BLOOD  Result Value Ref Range Status   Specimen Description   Final    BLOOD BLOOD RIGHT HAND Performed at [facility], [address], [place], [place] [address]    Special Requests   Final    BOTTLES DRAWN AEROBIC AND ANAEROBIC Blood Culture results may not be optimal due to an inadequate volume of blood received in culture bottles Performed at [facility], [address], [place], [place] [address]    Culture   Final    NO GROWTH 5 DAYS Performed at [facility], [address] [place], [place] [address]    Report Status 06/06/2024 FINAL  Final  Resp panel by RT-PCR (RSV, Flu A&B, Covid) Anterior Nasal Swab     Status: None   Collection Time: 06/01/24  2:30 AM   Specimen: Anterior Nasal Swab  Result Value Ref Range Status   SARS Coronavirus 2 by RT PCR NEGATIVE NEGATIVE Final    Comment: (NOTE) SARS-CoV-2 target nucleic acids are NOT DETECTED.  The SARS-CoV-2 RNA is generally detectable in upper respiratory specimens during the acute phase of infection. The lowest concentration of SARS-CoV-2 viral copies this assay can detect is 138 copies/mL. A negative result does not preclude SARS-Cov-2 infection and should not be used as the sole basis for treatment or other patient management decisions. A negative result may occur with  improper specimen collection/handling, submission of specimen other than nasopharyngeal swab, presence of viral mutation(s) within the areas targeted by this assay, and inadequate number of viral copies(<138 copies/mL). A negative result must be combined with clinical observations, patient history, and epidemiological information. The expected result is Negative.  Fact Sheet for Patients:  BloggerCourse.com  Fact Sheet for Healthcare Providers:  SeriousBroker.it  This test is no t yet approved or cleared by the [place]  FDA and  has been authorized for detection and/or diagnosis of SARS-CoV-2 by FDA under an Emergency Use Authorization (EUA). This EUA will remain  in effect (meaning this test can  be used) for the duration of the COVID-19 declaration under Section 564(b)(1) of the Act, 21 U.S.C.section 360bbb-3(b)(1), unless the authorization is terminated  or revoked sooner.       Influenza A by PCR NEGATIVE NEGATIVE Final   Influenza B by PCR  NEGATIVE NEGATIVE Final    Comment: (NOTE) The Xpert Xpress SARS-CoV-2/FLU/RSV plus assay is intended as an aid in the diagnosis of influenza from Nasopharyngeal swab specimens and should not be used as a sole basis for treatment. Nasal washings and aspirates are unacceptable for Xpert Xpress SARS-CoV-2/FLU/RSV testing.  Fact Sheet for Patients: BloggerCourse.com  Fact Sheet for Healthcare Providers: SeriousBroker.it  This test is not yet approved or cleared by the [place]  FDA and has been authorized for detection and/or diagnosis of SARS-CoV-2 by FDA under an Emergency Use Authorization (EUA). This EUA will remain in effect (meaning this test can be used) for the duration of the COVID-19 declaration under Section 564(b)(1) of the Act, 21 U.S.C. section 360bbb-3(b)(1), unless the authorization is terminated or revoked.     Resp Syncytial Virus by PCR NEGATIVE NEGATIVE Final    Comment: (NOTE) Fact Sheet for Patients: BloggerCourse.com  Fact Sheet for Healthcare Providers: SeriousBroker.it  This test is not yet approved or cleared by the [place]  FDA and has been authorized for detection and/or diagnosis of SARS-CoV-2 by FDA under an Emergency Use Authorization (EUA). This EUA will remain in effect (meaning this test can be used) for the duration of the COVID-19 declaration under Section 564(b)(1) of the Act, 21 U.S.C. section 360bbb-3(b)(1), unless the authorization is terminated or revoked.  Performed at [facility], [address], [place], [place] [address]   Blood Culture (routine x 2)     Status: None   Collection Time: 06/02/24  8:35 AM   Specimen: BLOOD  Result Value Ref Range Status   Specimen Description   Final    BLOOD RIGHT ANTECUBITAL Performed at [facility], [address], [place], [place] [address]     Special Requests   Final    BOTTLES DRAWN AEROBIC AND ANAEROBIC Blood Culture adequate volume Performed at [facility], [address], [place], [place] [address]    Culture   Final    NO GROWTH 5 DAYS Performed at [facility], [address] [place], [place] [address]    Report Status 06/07/2024 FINAL  Final  Resp panel by RT-PCR (RSV, Flu A&B, Covid) Anterior Nasal Swab     Status: None   Collection Time: 06/02/24 10:14 AM   Specimen: Anterior Nasal Swab  Result Value Ref Range Status   SARS Coronavirus 2 by RT PCR NEGATIVE NEGATIVE Final    Comment: (NOTE) SARS-CoV-2 target nucleic acids are NOT DETECTED.  The SARS-CoV-2 RNA is generally detectable in upper respiratory specimens during the acute phase of infection. The lowest concentration of SARS-CoV-2 viral copies this assay can detect is 138 copies/mL. A negative result does not preclude SARS-Cov-2 infection and should not be used as the sole basis for treatment or other patient management decisions. A negative result may occur with  improper specimen collection/handling, submission of specimen other than nasopharyngeal swab, presence of viral mutation(s) within the areas targeted by this assay, and inadequate number of viral copies(<138 copies/mL). A negative result must be combined with clinical observations, patient history, and epidemiological information. The expected result is Negative.  Fact Sheet for Patients:  BloggerCourse.com  Fact Sheet for Healthcare Providers:  SeriousBroker.it  This test is no t yet approved or cleared by the [place]  FDA and  has been authorized for detection and/or diagnosis of SARS-CoV-2 by FDA under an Emergency Use Authorization (EUA). This EUA will remain  in effect (meaning this test can be used) for the duration of the COVID-19 declaration under Section 564(b)(1) of the Act, 21 U.S.C.section  360bbb-3(b)(1), unless the authorization is terminated  or revoked sooner.       Influenza A by PCR NEGATIVE NEGATIVE Final   Influenza B by PCR NEGATIVE NEGATIVE Final    Comment: (NOTE) The Xpert Xpress SARS-CoV-2/FLU/RSV plus assay is intended as an aid in the diagnosis of influenza from Nasopharyngeal swab specimens and should not be used as a sole basis for treatment. Nasal washings and aspirates are unacceptable for Xpert Xpress SARS-CoV-2/FLU/RSV testing.  Fact Sheet for Patients: BloggerCourse.com  Fact Sheet for Healthcare Providers: SeriousBroker.it  This test is not yet approved or cleared by the [place]  FDA and has been authorized for detection and/or diagnosis of SARS-CoV-2 by FDA under an Emergency Use Authorization (EUA). This EUA will remain in effect (meaning this test can be used) for the duration of the COVID-19 declaration under Section 564(b)(1) of the Act, 21 U.S.C. section 360bbb-3(b)(1), unless the authorization is terminated or revoked.     Resp Syncytial Virus by PCR NEGATIVE NEGATIVE Final    Comment: (NOTE) Fact Sheet for Patients: BloggerCourse.com  Fact Sheet for Healthcare Providers: SeriousBroker.it  This test is not yet approved or cleared by the [place]  FDA and has been authorized for detection and/or diagnosis of SARS-CoV-2 by FDA under an Emergency Use Authorization (EUA). This EUA will remain in effect (meaning this test can be used) for the duration of the COVID-19 declaration under Section 564(b)(1) of the Act, 21 U.S.C. section 360bbb-3(b)(1), unless the authorization is terminated or revoked.  Performed at [facility], [address], [place], [place] [address]   Blood Culture (routine x 2)     Status: None   Collection Time: 06/02/24 12:45 PM   Specimen: BLOOD  Result Value Ref Range Status    Specimen Description   Final    BLOOD BLOOD LEFT ARM Performed at [facility], [address] [place], [place] [address]    Special Requests   Final    BOTTLES DRAWN AEROBIC ONLY Blood Culture adequate volume Performed at [facility], [address], [place], [place] [address]    Culture   Final    NO GROWTH 5 DAYS Performed at [facility], [address] [place], [place] [address]    Report Status 06/07/2024 FINAL  Final     Studies: DG Abd Portable 1V-Small Bowel Obstruction Protocol-initial, 8 hr delay Result Date: 06/07/2024 CLINICAL DATA:  8 hour post enteric contrast small-bowel obstruction follow-up exam. EXAM: PORTABLE ABDOMEN - 1 VIEW COMPARISON:  Portable abdomen yesterday 10:32 a.m., portable abdomen 06/05/2024 FINDINGS: 12:21 a.m. NGT curves to the left and up within the stomach with the tip in the proximal fundus. Small volume of enteric contrast remains in the gastric fundus and neck trauma. Faint dilute contrast partially opacifies dilated small bowel. There are some decompressed segments in true pelvis but elsewhere the small bowel is diffusely dilated, with little change over the previous 3 days films up to 4.8 cm. There is scattered barium precipitate in left hemiabdomen. Faint contrast seen in the cecum, proximal ascending colon and is present as far distally as distal  descending colon. There is no supine evidence of free air. Small pleural effusions appear similar. Dextroscoliosis and degenerative change lumbar spine osteopenia. IMPRESSION: 1. Persistent diffuse small bowel dilatation with little change over the previous 3 days. 2. Faint contrast seen in the cecum, proximal ascending colon and distal descending colon. 3. Small pleural effusions appear similar. 4. NGT curves to the left and up within the stomach with the tip in the proximal fundus. Electronically Signed   By: Francis Quam M.D.   On: 06/07/2024 00:42   DG Abd 1  View Result Date: 06/06/2024 CLINICAL DATA:  NG placement. EXAM: ABDOMEN - 1 VIEW COMPARISON:  Radiograph dated 06/05/2024. FINDINGS: Enteric tube with tip in the left upper abdomen in similar position in the region of the gastric fundus. Dilated small bowel loops again noted. IMPRESSION: Enteric tube with tip in the region of the gastric fundus. Electronically Signed   By: Vanetta Chou M.D.   On: 06/06/2024 14:34   DG Abd Portable 1V Result Date: 06/05/2024 CLINICAL DATA:  NG tube placement. EXAM: PORTABLE ABDOMEN - 1 VIEW COMPARISON:  06/04/2024. FINDINGS: Enteric tube tip overlies the expected region of the gastric fundus with side port in the gastric body. Similar gaseous distension of upper abdominal small bowel, suggestive of small bowel obstruction. IMPRESSION: 1. Enteric tube tip overlies the expected region of the gastric fundus with side port in the gastric body. 2. Similar gaseous dilated loops of upper abdominal small bowel, suggestive of small bowel obstruction. Electronically Signed   By: Harrietta Sherry M.D.   On: 06/05/2024 14:01      Vernal Alstrom, MD  [facility] 06/07/2024  If 7PM-7AM, please contact night-coverage

## 2024-06-07 NOTE — Progress Notes (Signed)
 Patient alert and pleasantly confused, son at the bedside. NGT in Right nare to low intermittent suction. Patient had a medium loose stool. She denies having any complaints at this time.

## 2024-06-07 NOTE — Plan of Care (Signed)
  Problem: Clinical Measurements: Goal: Will remain free from infection Outcome: Progressing   Problem: Activity: Goal: Risk for activity intolerance will decrease Outcome: Progressing   Problem: Elimination: Goal: Will not experience complications related to urinary retention Outcome: Progressing   Problem: Pain Managment: Goal: General experience of comfort will improve and/or be controlled Outcome: Progressing   Problem: Skin Integrity: Goal: Risk for impaired skin integrity will decrease Outcome: Progressing

## 2024-06-07 NOTE — Progress Notes (Signed)
 Occupational Therapy Treatment Patient Details Name: Lindsay Mason MRN: [id number] DOB: December 22, 1950 Today's Date: 06/07/2024   History of present illness 73 y.o. female admitted with positive blood culture today with unspecified Staphylococcus species, lethargy, nausea/vomiting and worsening mental status. Dx of SIRS, possible diverticulitis. 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 Patient in pain at her L IV site, with RN aware as she managed NGT for functional mobility. Patient able to complete minimal mobility with one person HHA, but limited in distance due to fatigue and decreased activity tolerance. Patient requiring increased redirection and cues to date (likely due to perseveration on pain). Patient's caregiver present throughout session. OT recommendation remains appropriate, will continue to follow.       If plan is discharge home, recommend the following:  A little help with walking and/or transfers;A little help with bathing/dressing/bathroom;Assistance with cooking/housework;Assist for transportation;Help with stairs or ramp for entrance;Direct supervision/assist for medications management   Equipment Recommendations  BSC/3in1    Recommendations for Other Services      Precautions / Restrictions Precautions Precautions: Fall Recall of Precautions/Restrictions: Impaired Precaution/Restrictions Comments: h/o dementia, NG tube,  mittens Restrictions Weight Bearing Restrictions Per Provider Order: No       Mobility Bed Mobility Overal bed mobility: Needs Assistance Bed Mobility: Supine to Sit, Sit to Supine     Supine to sit: Contact guard Sit to supine: Min assist   General bed mobility comments: increased assist to return to supine due to fatigue and distractability    Transfers Overall transfer level: Needs assistance Equipment used: 1 person hand held assist Transfers: Sit to/from Stand Sit to Stand: Min assist           General transfer comment: 1 person HHA with min A provided to navigate environment and stabilize     Balance Overall balance assessment: Needs assistance Sitting-balance support: No upper extremity supported, Feet supported Sitting balance-Leahy Scale: Good     Standing balance support: Single extremity supported Standing balance-Leahy Scale: Fair Standing balance comment: occasionally reliant on one person HHA                           ADL either performed or assessed with clinical judgement   ADL Overall ADL's : Needs assistance/impaired                         Toilet Transfer: Minimal assistance;Cueing for safety;Cueing for sequencing           Functional mobility during ADLs: Minimal assistance;Cueing for safety;Cueing for sequencing General ADL Comments: Patient seen in order to progress with functional independence and increase activity tolerance. Patient in pain at her L IV site, with RN aware as she managed NGT for functional mobility. Patient able to complete minimal mobility with one person HHA, but limited in distance due to fatigue and decreased activity tolerance. Patient requiring increased redirection and cues to date (likely due to perseveration on pain). Patient's caregiver present throughout session. OT recommendation remains appropriate, will continue to follow.    Extremity/Trunk Assessment  Vision        Perception     Praxis     Communication Communication Communication: No apparent difficulties   Cognition Arousal: Alert Behavior During Therapy: WFL for tasks assessed/performed Cognition: History of cognitive impairments             OT - Cognition Comments: A/O x1, history of dementia, poor problem solving, decreased safety awareness                 Following commands: Impaired Following commands impaired: Follows one step commands with increased time      Cueing   Cueing Techniques: Verbal cues, Gestural cues, Tactile cues, Visual cues  Exercises      Shoulder Instructions       General Comments      Pertinent Vitals/ Pain       Pain Assessment Pain Assessment: Faces Faces Pain Scale: Hurts whole lot Pain Location: L arm IV site Pain Descriptors / Indicators: Aching, Constant, Discomfort, Grimacing Pain Intervention(s): Limited activity within patient's tolerance, Monitored during session, Repositioned  Home Living                                          Prior Functioning/Environment              Frequency  Min 2X/week        Progress Toward Goals  OT Goals(current goals can now be found in the care plan section)  Progress towards OT goals: Progressing toward goals  Acute Rehab OT Goals Patient Stated Goal: unable OT Goal Formulation: With family Time For Goal Achievement: 06/17/24 Potential to Achieve Goals: Good  Plan      Co-evaluation                 AM-PAC OT 6 Clicks Daily Activity     Outcome Measure   Help from another person eating meals?: A Little Help from another person taking care of personal grooming?: A Little Help from another person toileting, which includes using toliet, bedpan, or urinal?: A Little Help from another person bathing (including washing, rinsing, drying)?: A Lot Help from another person to put on and taking off regular upper body clothing?: A Little Help from another  person to put on and taking off regular lower body clothing?: A Lot 6 Click Score: 16    End of Session Equipment Utilized During Treatment: Gait belt  OT Visit Diagnosis: Unsteadiness on feet (R26.81);Other abnormalities of gait and mobility (R26.89);Muscle weakness (generalized) (M62.81);Pain;Other symptoms and signs involving cognitive function Pain - Right/Left: Left Pain - part of body: Arm   Activity Tolerance Patient limited by pain   Patient Left in bed;with call bell/phone within reach;with bed alarm set;with restraints reapplied;with family/visitor present   Nurse Communication Mobility status (NGT)        Time: 8652-8593 OT Time Calculation (min): 19 min  Charges: OT General Charges $OT Visit: 1 Visit OT Treatments $Self Care/Home Management : 8-22 mins  Ronal Gift E. Orbin Mayeux, OTR/L Acute Rehabilitation Services [phone]   Ronal Gift Salt 06/07/2024, 2:39 PM

## 2024-06-08 ENCOUNTER — Inpatient Hospital Stay (HOSPITAL_COMMUNITY)

## 2024-06-08 ENCOUNTER — Other Ambulatory Visit: Payer: Self-pay

## 2024-06-08 DIAGNOSIS — I739 Peripheral vascular disease, unspecified: Secondary | ICD-10-CM | POA: Diagnosis not present

## 2024-06-08 DIAGNOSIS — E785 Hyperlipidemia, unspecified: Secondary | ICD-10-CM | POA: Diagnosis not present

## 2024-06-08 DIAGNOSIS — R6510 Systemic inflammatory response syndrome (SIRS) of non-infectious origin without acute organ dysfunction: Secondary | ICD-10-CM | POA: Diagnosis not present

## 2024-06-08 DIAGNOSIS — J449 Chronic obstructive pulmonary disease, unspecified: Secondary | ICD-10-CM | POA: Diagnosis not present

## 2024-06-08 LAB — CBC
HCT: 39.4 % (ref 36.0–46.0)
Hemoglobin: 12.4 g/dL (ref 12.0–15.0)
MCH: 28.6 pg (ref 26.0–34.0)
MCHC: 31.5 g/dL (ref 30.0–36.0)
MCV: 91 fL (ref 80.0–100.0)
Platelets: 324 K/uL (ref 150–400)
RBC: 4.33 MIL/uL (ref 3.87–5.11)
RDW: 14.5 % (ref 11.5–15.5)
WBC: 12.1 K/uL — ABNORMAL HIGH (ref 4.0–10.5)
nRBC: 0 % (ref 0.0–0.2)

## 2024-06-08 LAB — COMPREHENSIVE METABOLIC PANEL WITH GFR
ALT: 21 U/L (ref 0–44)
AST: 30 U/L (ref 15–41)
Albumin: 2.5 g/dL — ABNORMAL LOW (ref 3.5–5.0)
Alkaline Phosphatase: 64 U/L (ref 38–126)
Anion gap: 6 (ref 5–15)
BUN: 6 mg/dL — ABNORMAL LOW (ref 8–23)
CO2: 27 mmol/L (ref 22–32)
Calcium: 7.7 mg/dL — ABNORMAL LOW (ref 8.9–10.3)
Chloride: 105 mmol/L (ref 98–111)
Creatinine, Ser: 0.5 mg/dL (ref 0.44–1.00)
GFR, Estimated: >60 mL/min (ref >60)
Glucose, Bld: 108 mg/dL — ABNORMAL HIGH (ref 70–99)
Potassium: 3.3 mmol/L — ABNORMAL LOW (ref 3.5–5.1)
Sodium: 138 mmol/L (ref 135–145)
Total Bilirubin: 0.3 mg/dL (ref 0.0–1.2)
Total Protein: 5 g/dL — ABNORMAL LOW (ref 6.5–8.1)

## 2024-06-08 LAB — HEMOGLOBIN A1C
Hgb A1c MFr Bld: 5.5 % (ref 4.8–5.6)
Mean Plasma Glucose: 111.15 mg/dL

## 2024-06-08 LAB — MAGNESIUM: Magnesium: 2 mg/dL (ref 1.7–2.4)

## 2024-06-08 LAB — PHOSPHORUS: Phosphorus: 1.9 mg/dL — ABNORMAL LOW (ref 2.5–4.6)

## 2024-06-08 LAB — GLUCOSE, CAPILLARY: Glucose-Capillary: 89 mg/dL (ref 70–99)

## 2024-06-08 MED ORDER — POTASSIUM CHLORIDE 10 MEQ/100ML IV SOLN
10.0000 meq | INTRAVENOUS | Status: AC
  Administered 2024-06-08 (×4): 10 meq via INTRAVENOUS
  Filled 2024-06-08 (×3): qty 100

## 2024-06-08 MED ORDER — HALOPERIDOL LACTATE 5 MG/ML IJ SOLN: 1.0000 mg | Freq: Four times a day (QID) | INTRAMUSCULAR | Status: DC | PRN

## 2024-06-08 MED ORDER — IOHEXOL 300 MG/ML  SOLN
100.0000 mL | Freq: Once | INTRAMUSCULAR | Status: AC | PRN
  Administered 2024-06-08: 100 mL via INTRAVENOUS

## 2024-06-08 MED ORDER — DEXTROSE-SODIUM CHLORIDE 5-0.45 % IV SOLN: INTRAVENOUS | Status: DC

## 2024-06-08 MED ORDER — SODIUM CHLORIDE 0.9% FLUSH
10.0000 mL | INTRAVENOUS | Status: DC | PRN
  Administered 2024-06-25: 10 mL

## 2024-06-08 MED ORDER — INSULIN ASPART 100 UNIT/ML IJ SOLN
0.0000 [IU] | Freq: Four times a day (QID) | INTRAMUSCULAR | Status: DC
  Administered 2024-06-09 (×2): 1 [IU] via SUBCUTANEOUS
  Administered 2024-06-09 (×2): 2 [IU] via SUBCUTANEOUS
  Administered 2024-06-10: 1 [IU] via SUBCUTANEOUS
  Administered 2024-06-10: 2 [IU] via SUBCUTANEOUS
  Administered 2024-06-10: 1 [IU] via SUBCUTANEOUS
  Administered 2024-06-10: 3 [IU] via SUBCUTANEOUS
  Administered 2024-06-11 – 2024-06-12 (×5): 1 [IU] via SUBCUTANEOUS
  Administered 2024-06-14 (×2): 2 [IU] via SUBCUTANEOUS
  Administered 2024-06-14 – 2024-06-28 (×8): 1 [IU] via SUBCUTANEOUS

## 2024-06-08 MED ORDER — SODIUM CHLORIDE 0.9% FLUSH
10.0000 mL | Freq: Two times a day (BID) | INTRAVENOUS | Status: DC
  Administered 2024-06-08 (×2): 10 mL
  Administered 2024-06-09: 20 mL
  Administered 2024-06-09: 10 mL
  Administered 2024-06-10: 40 mL
  Administered 2024-06-10 – 2024-06-19 (×19): 10 mL
  Administered 2024-06-20: 30 mL
  Administered 2024-06-20 – 2024-06-24 (×8): 10 mL
  Administered 2024-06-24: 40 mL
  Administered 2024-06-25 – 2024-07-07 (×25): 10 mL
  Administered 2024-07-07: 20 mL
  Administered 2024-07-08: 10 mL

## 2024-06-08 MED ORDER — POTASSIUM PHOSPHATES 15 MMOLE/5ML IV SOLN
20.0000 mmol | Freq: Once | INTRAVENOUS | Status: AC
  Administered 2024-06-08: 20 mmol via INTRAVENOUS
  Filled 2024-06-08: qty 6.67

## 2024-06-08 MED ORDER — CHLORHEXIDINE GLUCONATE CLOTH 2 % EX PADS
6.0000 | MEDICATED_PAD | Freq: Every day | CUTANEOUS | Status: DC
  Administered 2024-06-08 – 2024-07-08 (×30): 6 via TOPICAL

## 2024-06-08 MED ORDER — TRAVASOL 10 % IV SOLN
INTRAVENOUS | Status: AC
  Filled 2024-06-08: qty 480

## 2024-06-08 MED ORDER — IOHEXOL 9 MG/ML PO SOLN
1000.0000 mL | ORAL | Status: AC
  Administered 2024-06-08: 1000 mL via ORAL

## 2024-06-08 MED ORDER — HYDRALAZINE HCL 20 MG/ML IJ SOLN: 5.0000 mg | Freq: Four times a day (QID) | INTRAMUSCULAR | Status: DC | PRN

## 2024-06-08 NOTE — Progress Notes (Signed)
 PT Cancellation Note  Patient Details Name: Lindsay Mason MRN: [id number] DOB: 09-28-1951   Cancelled Treatment:    Reason Eval/Treat Not Completed:  Spoke with RN and family-request PT check back another day.    Dannial SQUIBB, PT Acute Rehabilitation  Office: [phone]

## 2024-06-08 NOTE — Progress Notes (Addendum)
 Initial Nutrition Assessment  INTERVENTION:   Monitor magnesium , potassium, and phosphorus daily for at least 3 days, MD to replete as needed, as pt is at risk for refeeding syndrome. -Recommend 100 mg Thiamine  daily for at least 5 days  -TPN management per Pharmacy -Daily weights while on TPN  -Monitor for diet advancement  NUTRITION DIAGNOSIS:   Inadequate oral intake related to  (PSBO) as evidenced by NPO status.  GOAL:   Patient will meet greater than or equal to 90% of their needs  MONITOR:   Labs (TPN)  REASON FOR ASSESSMENT:    (New TPN)    ASSESSMENT:   73 y.o. 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 Currently being treated for small bowel obstruction/ileus with NG tube and IV fluids.  Patient in room, receiving patient care from RN. Family at bedside to assist with history. Pt able to answer some questions but with memory issues r/t dementia. Pt usually a good eater, consumes 3 meals a day, not picky. Per son, pt last ate 1/2 sandwich on 8/19. Did consume 1/2 Boost when she first got here 8/21 but that was the last intakes she has had other than some small amounts of liquids. Currently NPO, NGT in place, output ~700 ml since yesterday.  TPN to begin at 40 ml/hr tonight, providing ~1036 kcals and 48g protein.   Per weight records, pt has lost 20 lbs since 09/09/23 (12% wt loss x 9 months, insignificant for time frame). Weight has been steadily trending down.  Medications: D5 infusion, KCl, K-Phos  Labs reviewed: Low potassium Low Phos  NUTRITION - FOCUSED PHYSICAL EXAM:  Unable to complete, receiving patient care  Diet Order:   Diet Order             Diet NPO time  specified Except for: Ice Chips, Sips with Meds  Diet effective midnight                   EDUCATION NEEDS:   No education needs have been identified at this time  Skin:  Skin Assessment: Reviewed RN Assessment  Last BM:  8/25 -type 6  Height:   Ht Readings from Last 1 Encounters:  06/02/24 5' 2 (1.575 m)    Weight:   Wt Readings from Last 1 Encounters:  06/02/24 61.3 kg    BMI:  Body mass index is 24.72 kg/m.  Estimated Nutritional Needs:   Kcal:  1650-1850  Protein:  75-90g  Fluid:  1.8L/day  Morna Lee, MS, RD, LDN Inpatient Clinical Dietitian Contact via Secure chat

## 2024-06-08 NOTE — Progress Notes (Signed)
 PHARMACY - TOTAL PARENTERAL NUTRITION CONSULT NOTE   Indication: Prolonged ileus  Patient Measurements: Height: 5' 2 (157.5 cm) Weight: 61.3 kg (135 lb 2.3 oz) IBW/kg (Calculated) : 50.1 TPN AdjBW (KG): 61.3 Body mass index is 24.72 kg/m. Usual Weight:    Assessment: Presented to ED with N/V/D and abdominal pain x 3d on 05/31/24. CT negative. Possibly from diverticulitis and placed on Cefepime /Flagyl .  - 8/22 KUB concerning for SBO>decompressed with NG. - Con't to have persistent pain and distention on KUB. No significant po intake since 8/21. No indication for emergent surgery but place PICC and start TPN on 8/26.   Glucose / Insulin : No h/o oDM Electrolytes: keep K>4.0, Mg >2.0 and Phos >3.0 to optimize bowel function.  Today K 3.3 low, Phos 1.9 low, Mg 2 WNL-suppl ordered Renal: Scr <1 Hepatic: LFT's WNL Intake / Output; MIVF: D545 at 35ml/hr NG tube: clamp today Ensure: not taking.  UOP some unmeasured LBM 8/25   GI Imaging: 8/19: CT: Scattered colonic diverticulosis. No active diverticulitis.  8/22, 8/23, 8/25, 8/26: Abd Xray: all SBO GI Surgeries / Procedures: none  Central access:  TPN start date:   Nutritional Goals: Goal TPN rate is  mL/hr (provides  g of protein and  kcals per day)  RD Assessment: TPD  Current Nutrition:  NPO  Plan:  Kphos 20mmol IV x 1 by MD (will provide 29.5 meq K+) Kruns x 4 today  Start TPN at 40mL/hr at 1800 Electrolytes in TPN: Na 37mEq/L, K 32mEq/L, Ca 24mEq/L, Mg 54mEq/L, and Phos 15mmol/L. Cl:Ac 1:1 Add standard MVI and trace elements to TPN Initiate Sensitive q6h SSI and adjust as needed  Reduce MIVF to 35 mL/hr at 1800 Monitor TPN labs on Mon/Thurs, and PRN    Keaton Beichner Karoline Marina, PharmD, BCPS Clinical Staff Pharmacist Marina Salines Stillinger 06/08/2024,9:48 AM

## 2024-06-08 NOTE — Plan of Care (Signed)
  Problem: Activity: Goal: Risk for activity intolerance will decrease Outcome: Progressing   Problem: Nutrition: Goal: Adequate nutrition will be maintained Outcome: Progressing   Problem: Elimination: Goal: Will not experience complications related to urinary retention Outcome: Progressing   Problem: Pain Managment: Goal: General experience of comfort will improve and/or be controlled Outcome: Progressing   Problem: Education: Goal: Ability to describe self-care measures that may prevent or decrease complications (Diabetes Survival Skills Education) will improve Outcome: Progressing Goal: Individualized Educational Video(s) Outcome: Progressing   Problem: Health Behavior/Discharge Planning: Goal: Ability to identify and utilize available resources and services will improve Outcome: Progressing Goal: Ability to manage health-related needs will improve Outcome: Progressing   Problem: Nutritional: Goal: Maintenance of adequate nutrition will improve Outcome: Progressing Goal: Progress toward achieving an optimal weight will improve Outcome: Progressing   Problem: Tissue Perfusion: Goal: Adequacy of tissue perfusion will improve Outcome: Progressing

## 2024-06-08 NOTE — Telephone Encounter (Signed)
 Hospitalized with partial SBO and bacteremia

## 2024-06-08 NOTE — Plan of Care (Signed)

## 2024-06-08 NOTE — Progress Notes (Addendum)
 PROGRESS NOTE  Lindsay Mason FMW:[id number] DOB: 10-24-1950 DOA: 06/02/2024 PCP: [name], MD   LOS: 6 days   Brief narrative:  Lindsay Mason is a 73 y.o. 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  Patient had lethargy and worsening mental status.  In the ED, patient had slightly low blood pressure.  Labs were notable for WBC elevated at 13.7.  CT abdomen/pelvis done on 06/01/2024 with no acute findings but scattered colonic diverticulosis with small diverticulitis.  Patient was then considered for admission to the hospital for further evaluation and treatment. During hospitalization, patient continued to have abdominal distention and pain so general surgery was consulted.  Currently being treated for small bowel obstruction/ileus with NG tube and IV fluids and conservative treatment.   Assessment/Plan: Principal Problem:   SIRS (systemic inflammatory response syndrome) (HCC) Active Problems:   COPD with emphysema (HCC)   Thoracic aortic atherosclerosis (HCC)   History of essential hypertension   Severe early onset Alzheimer dementia (HCC)   PAD (peripheral artery disease) (HCC)   Confusion and disorientation   Positive blood culture   Nausea and vomiting   Abdominal pain   Hyperlipidemia   Abnormal EKG   Abdominal pain/  Nausea and vomiting likely secondary to acute diverticulitis. Sepsis likely secondary to possible acute diverticulitis Small bowel obstruction/ileus Patient initially had leukocytosis with hypotension and lethargy and clinical suspicion for diverticulitis suggestive of possible sepsis.  CT scan of the abdomen initially was negative for acute findings but had clinical  symptoms of diverticulitis.  Currently on  cefepime  and metronidazole .  Blood cultures negative in  5 days.  General surgery was consulted due to persistent abdominal distention and pain.  Imaging with x-ray of the abdomen showed possible SBO/ileus.  Patient was then started on conservative treatment with NG tube IV fluid with electrolyte replacement and continues to have pain and distention with NG output.  Will follow general surgery recommendation.  Might need to repeat CT scan.  Currently on D5 water for hydration and nutrition.  Might need alternate modes of nutrition if prolonged ileus.   Hypokalemia.  Potassium of 3.3 today.  Will continue to replenish through IV K-Phos today.  Check levels in AM.  Metabolic encephalopathy on the background of Alzheimer's dementia.   Likely secondary to infection.  On nortriptyline  trazodone  Exelon .  Patient alert awake and Communicative but patient's son states that she is more cognitively impaired than baseline.  Needed some mittens to prevent from pulling lines.     Positive blood culture with staph species. Contamination was suspected.  Vancomycin  was discontinued.     COPD with emphysema  Continue albuterol  inhaler.  Appears compensated.  On room air     Hyperlipidemia   PAD (peripheral artery disease)    Thoracic aortic atherosclerosis On Lipitor at home we will hold off for now.     History of essential hypertension Currently not on any medication.  As needed IV antihypertensive is necessary.  Weakness, deconditioning.  Seen by PT and recommend home health PT on discharge.  DVT prophylaxis: SCDs Start: 06/02/24 1114   Disposition: Home with home health likely in 2 to 3 days,  Status is: Inpatient Remains inpatient appropriate because: Pending clinical improvement, ongoing SBO/ileus, IV antibiotics, NG tube, IV fluids, surgical follow-up,  Code Status:     Code Status: Full Code  Family Communication: Spoke with the patient's son at  bedside again today.  Consultants: General Surgery  Procedures: NG tube placement  Anti-infectives:  Cefepime  and Flagyl  IV  Anti-infectives (From admission, onward)    Start     Dose/Rate Route Frequency Ordered Stop   06/03/24 1130  vancomycin  (VANCOCIN ) IVPB 1000 mg/200 mL premix  Status:  Discontinued        1,000 mg 200 mL/hr over 60 Minutes Intravenous Every 24 hours 06/02/24 1149 06/03/24 0949   06/03/24 0000  ceFEPIme  (MAXIPIME ) 2 g in sodium chloride  0.9 % 100 mL IVPB        2 g 200 mL/hr over 30 Minutes Intravenous Every 12 hours 06/02/24 1149 06/08/24 2359   06/02/24 2200  metroNIDAZOLE  (FLAGYL ) IVPB 500 mg        500 mg 100 mL/hr over 60 Minutes Intravenous Every 12 hours 06/02/24 1113 06/09/24 2159   06/02/24 1100  ceFEPIme  (MAXIPIME ) 2 g in sodium chloride  0.9 % 100 mL IVPB        2 g 200 mL/hr over 30 Minutes Intravenous  Once 06/02/24 1045 06/02/24 1449   06/02/24 1100  vancomycin  (VANCOCIN ) IVPB 1000 mg/200 mL premix        1,000 mg 200 mL/hr over 60 Minutes Intravenous  Once 06/02/24 1045 06/02/24 1233   06/02/24 1100  metroNIDAZOLE  (FLAGYL ) IVPB 500 mg        500 mg 100 mL/hr over 60 Minutes Intravenous  Once 06/02/24 1045 06/02/24 1818       Subjective: Today, patient was seen and examined at bedside.  Patient mildly interactive.  Son at bedside.  Family states that she has been having distention and abdominal pain.  Did have some bowel movements small yesterday.  Does not feel like she has improved.  She continues to be weaker and cognitively declined as per the patient's son at bedside.  Patient however denies any shortness of breath dyspnea.  Poor historian  Objective: Vitals:   06/07/24 1937 06/08/24 0513  BP: (!) 139/102 (!) 114/103  Pulse: 92 93  Resp: 17 18  Temp: 98.6 F (37 C) 98.3 F (36.8 C)  SpO2: 98% 95%    Intake/Output Summary (Last 24 hours) at 06/08/2024 0908 Last data filed at 06/08/2024 0640 Gross per 24 hour  Intake 3637.25  ml  Output 1700 ml  Net 1937.25 ml   Filed Weights   06/02/24 0812  Weight: 61.3 kg   Body mass index is 24.72 kg/m.   Physical Exam:  General:  Average built, not in obvious distress, alert awake and interactive, has underlying cognitive dysfunction.  On mittens HENT:   No scleral pallor or icterus noted. Oral mucosa is moist.  NG tube in place with mild bilious fluid. Chest:  Clear breath sounds. No crackles or wheezes.  CVS: S1 &S2 heard. No murmur.  Regular rate and rhythm. Abdomen: Soft, slightly distended tympanic abdomen with diffuse tenderness on palpation.  Bowel sounds are heard.   Extremities: No cyanosis, clubbing or edema.  Peripheral pulses are palpable. Psych: Alert, awake with cognitive dysfunction. CNS:  No cranial nerve deficits.  Moving all extremities. Skin: Warm and dry.  No rashes noted.   Data Review: I have personally reviewed the following laboratory data and studies,  CBC: Recent Labs  Lab 06/02/24 1014 06/03/24 0557 06/04/24 0544 06/05/24 0736 06/06/24 0611 06/07/24 0633 06/08/24 0531  WBC 13.7* 13.9* 7.8 4.2 8.8 7.8  12.1*  NEUTROABS 11.9* 11.9*  --   --   --   --   --   HGB 10.5* 12.6 12.1 10.7* 12.6 11.8* 12.4  HCT 32.6* 40.7 37.3 35.2* 39.6 38.1 39.4  MCV 91.6 93.3 92.1 93.9 93.4 91.4 91.0  PLT 188 219 275 252 307 309 324   Basic Metabolic Panel: Recent Labs  Lab 06/04/24 0544 06/05/24 0736 06/06/24 0611 06/07/24 0633 06/08/24 0531  NA 138 135 139 139 138  K 3.3* 3.4* 3.1* 3.1* 3.3*  CL 105 106 104 106 105  CO2 23 22 25 27 27   GLUCOSE 112* 297* 107* 99 108*  BUN 15 11 11 11  6*  CREATININE 0.65 0.45 0.53 0.61 0.50  CALCIUM  8.4* 7.9* 7.7* 7.7* 7.7*  MG 1.9 1.8 1.8 1.8 2.0  PHOS  --   --   --   --  1.9*   Liver Function Tests: Recent Labs  Lab 06/02/24 0908 06/03/24 0557 06/08/24 0531  AST 26 17 30   ALT 21 16 21   ALKPHOS 76 77 64  BILITOT 1.4* 1.1 0.3  PROT 6.3* 5.8* 5.0*  ALBUMIN  3.4* 3.1* 2.5*   No results for  input(s): LIPASE, AMYLASE in the last 168 hours.  No results for input(s): AMMONIA in the last 168 hours. Cardiac Enzymes: No results for input(s): CKTOTAL, CKMB, CKMBINDEX, TROPONINI in the last 168 hours. BNP (last 3 results) No results for input(s): BNP in the last 8760 hours.  ProBNP (last 3 results) No results for input(s): PROBNP in the last 8760 hours.  CBG: No results for input(s): GLUCAP in the last 168 hours. Recent Results (from the past 240 hours)  Blood Culture (routine x 2)     Status: Abnormal   Collection Time: 06/01/24  2:03 AM   Specimen: BLOOD  Result Value Ref Range Status   Specimen Description   Final    BLOOD RIGHT ANTECUBITAL Performed at [facility], [address] [place], [place] [address]    Special Requests   Final    BOTTLES DRAWN AEROBIC AND ANAEROBIC Blood Culture results may not be optimal due to an inadequate volume of blood received in culture bottles Performed at [facility], [address], [place], [place] [address]    Culture  Setup Time   Final    GRAM POSITIVE COCCI IN CLUSTERS AEROBIC BOTTLE ONLY CRITICAL RESULT CALLED TO, READ BACK BY AND VERIFIED WITH: RN MARVAL W [id number] FCP    Culture (A)  Final    STAPHYLOCOCCUS HOMINIS THE SIGNIFICANCE OF ISOLATING THIS ORGANISM FROM A SINGLE SET OF BLOOD CULTURES WHEN MULTIPLE SETS ARE DRAWN IS UNCERTAIN. PLEASE NOTIFY THE MICROBIOLOGY DEPARTMENT WITHIN ONE WEEK IF SPECIATION AND SENSITIVITIES ARE REQUIRED. Performed at [facility], [address], [place], [place] [address]    Report Status 06/03/2024 FINAL  Final  Blood Culture ID Panel (Reflexed)     Status: Abnormal   Collection Time: 06/01/24  2:03 AM  Result Value Ref Range Status   Enterococcus faecalis NOT DETECTED NOT DETECTED Final   Enterococcus Faecium NOT DETECTED NOT DETECTED Final   Listeria monocytogenes NOT DETECTED NOT DETECTED Final   Staphylococcus  species DETECTED (A) NOT DETECTED Final    Comment: CRITICAL RESULT CALLED TO, READ BACK BY AND VERIFIED WITH: RN MARVAL ORN [id number] FCP    Staphylococcus aureus (BCID) NOT DETECTED NOT DETECTED Final   Staphylococcus epidermidis NOT DETECTED NOT DETECTED Final   Staphylococcus  lugdunensis NOT DETECTED NOT DETECTED Final   Streptococcus species NOT DETECTED NOT DETECTED Final   Streptococcus agalactiae NOT DETECTED NOT DETECTED Final   Streptococcus pneumoniae NOT DETECTED NOT DETECTED Final   Streptococcus pyogenes NOT DETECTED NOT DETECTED Final   A.calcoaceticus-baumannii NOT DETECTED NOT DETECTED Final   Bacteroides fragilis NOT DETECTED NOT DETECTED Final   Enterobacterales NOT DETECTED NOT DETECTED Final   Enterobacter cloacae complex NOT DETECTED NOT DETECTED Final   Escherichia coli NOT DETECTED NOT DETECTED Final   Klebsiella aerogenes NOT DETECTED NOT DETECTED Final   Klebsiella oxytoca NOT DETECTED NOT DETECTED Final   Klebsiella pneumoniae NOT DETECTED NOT DETECTED Final   Proteus species NOT DETECTED NOT DETECTED Final   Salmonella species NOT DETECTED NOT DETECTED Final   Serratia marcescens NOT DETECTED NOT DETECTED Final   Haemophilus influenzae NOT DETECTED NOT DETECTED Final   Neisseria meningitidis NOT DETECTED NOT DETECTED Final   Pseudomonas aeruginosa NOT DETECTED NOT DETECTED Final   Stenotrophomonas maltophilia NOT DETECTED NOT DETECTED Final   Candida albicans NOT DETECTED NOT DETECTED Final   Candida auris NOT DETECTED NOT DETECTED Final   Candida glabrata NOT DETECTED NOT DETECTED Final   Candida krusei NOT DETECTED NOT DETECTED Final   Candida parapsilosis NOT DETECTED NOT DETECTED Final   Candida tropicalis NOT DETECTED NOT DETECTED Final   Cryptococcus neoformans/gattii NOT DETECTED NOT DETECTED Final    Comment: Performed at [facility], [address] [place], [place] [address]  Blood Culture (routine x 2)     Status: None   Collection  Time: 06/01/24  2:08 AM   Specimen: BLOOD  Result Value Ref Range Status   Specimen Description   Final    BLOOD BLOOD RIGHT HAND Performed at [facility], [address] [place], [place] [address]    Special Requests   Final    BOTTLES DRAWN AEROBIC AND ANAEROBIC Blood Culture results may not be optimal due to an inadequate volume of blood received in culture bottles Performed at [facility], [address], [place], [place] [address]    Culture   Final    NO GROWTH 5 DAYS Performed at [facility], [address] [place], [place] [address]    Report Status 06/06/2024 FINAL  Final  Resp panel by RT-PCR (RSV, Flu A&B, Covid) Anterior Nasal Swab     Status: None   Collection Time: 06/01/24  2:30 AM   Specimen: Anterior Nasal Swab  Result Value Ref Range Status   SARS Coronavirus 2 by RT PCR NEGATIVE NEGATIVE Final    Comment: (NOTE) SARS-CoV-2 target nucleic acids are NOT DETECTED.  The SARS-CoV-2 RNA is generally detectable in upper respiratory specimens during the acute phase of infection. The lowest concentration of SARS-CoV-2 viral copies this assay can detect is 138 copies/mL. A negative result does not preclude SARS-Cov-2 infection and should not be used as the sole basis for treatment or other patient management decisions. A negative result may occur with  improper specimen collection/handling, submission of specimen other than nasopharyngeal swab, presence of viral mutation(s) within the areas targeted by this assay, and inadequate number of viral copies(<138 copies/mL). A negative result must be combined with clinical observations, patient history, and epidemiological information. The expected result is Negative.  Fact Sheet for Patients:  BloggerCourse.com  Fact Sheet for Healthcare Providers:  SeriousBroker.it  This test is no t yet approved or cleared by the [place] and  has been authorized for detection and/or diagnosis of SARS-CoV-2 by FDA under an Emergency Use Authorization (EUA). This EUA will remain  in effect (meaning this test can be used) for the duration of the COVID-19 declaration under Section 564(b)(1) of the Act, 21 U.S.C.section 360bbb-3(b)(1), unless the authorization is terminated  or revoked sooner.       Influenza A by PCR NEGATIVE NEGATIVE Final   Influenza B by PCR NEGATIVE NEGATIVE Final    Comment: (NOTE) The Xpert Xpress SARS-CoV-2/FLU/RSV plus assay is intended as an aid in the diagnosis of influenza from Nasopharyngeal swab specimens and should not be used as a sole basis for treatment. Nasal washings and aspirates are unacceptable for Xpert Xpress SARS-CoV-2/FLU/RSV testing.  Fact Sheet for Patients: BloggerCourse.com  Fact Sheet for Healthcare Providers: SeriousBroker.it  This test is not yet approved or cleared by the [place]  FDA and has been authorized for detection and/or diagnosis of SARS-CoV-2 by FDA under an Emergency Use Authorization (EUA). This EUA will remain in effect (meaning this test can be used) for the duration of the COVID-19 declaration under Section 564(b)(1) of the Act, 21 U.S.C. section 360bbb-3(b)(1), unless the authorization is terminated or revoked.     Resp Syncytial Virus by PCR NEGATIVE NEGATIVE Final    Comment: (NOTE) Fact Sheet for Patients: BloggerCourse.com  Fact Sheet for Healthcare Providers: SeriousBroker.it  This test is not yet approved or cleared by the [place]  FDA and has been authorized for detection and/or diagnosis of SARS-CoV-2 by FDA under an Emergency Use Authorization (EUA). This EUA will remain in effect (meaning this test can be used) for the duration of the COVID-19 declaration under Section 564(b)(1) of the Act, 21 U.S.C. section  360bbb-3(b)(1), unless the authorization is terminated or revoked.  Performed at [facility], [address], [place], [place] [address]   Blood Culture (routine x 2)     Status: None   Collection Time: 06/02/24  8:35 AM   Specimen: BLOOD  Result Value Ref Range Status   Specimen Description   Final    BLOOD RIGHT ANTECUBITAL Performed at [facility], [address] [place], [place] [address]    Special Requests   Final    BOTTLES DRAWN AEROBIC AND ANAEROBIC Blood Culture adequate volume Performed at [facility], [address], [place], [place] [address]    Culture   Final    NO GROWTH 5 DAYS Performed at [facility], [address] [place], [place] [address]    Report Status 06/07/2024 FINAL  Final  Resp panel by RT-PCR (RSV, Flu A&B, Covid) Anterior Nasal Swab     Status: None   Collection Time: 06/02/24 10:14 AM   Specimen: Anterior Nasal Swab  Result Value Ref Range Status   SARS Coronavirus 2 by RT PCR NEGATIVE NEGATIVE Final    Comment: (NOTE) SARS-CoV-2 target nucleic acids are NOT DETECTED.  The SARS-CoV-2 RNA is generally detectable in upper respiratory specimens during the acute phase of infection. The lowest concentration of SARS-CoV-2 viral copies this assay can detect is 138 copies/mL. A negative result does not preclude SARS-Cov-2 infection and should not be used as the sole basis for treatment or other patient management decisions. A negative result may occur with  improper specimen collection/handling, submission of specimen other than nasopharyngeal swab, presence of viral mutation(s) within the areas targeted by this assay, and inadequate number of viral copies(<138 copies/mL). A negative result must be combined with  clinical observations, patient history, and epidemiological information. The expected result is Negative.  Fact Sheet for Patients:   BloggerCourse.com  Fact Sheet for Healthcare Providers:  SeriousBroker.it  This test is no t yet approved or cleared by the [place]  FDA and  has been authorized for detection and/or diagnosis of SARS-CoV-2 by FDA under an Emergency Use Authorization (EUA). This EUA will remain  in effect (meaning this test can be used) for the duration of the COVID-19 declaration under Section 564(b)(1) of the Act, 21 U.S.C.section 360bbb-3(b)(1), unless the authorization is terminated  or revoked sooner.       Influenza A by PCR NEGATIVE NEGATIVE Final   Influenza B by PCR NEGATIVE NEGATIVE Final    Comment: (NOTE) The Xpert Xpress SARS-CoV-2/FLU/RSV plus assay is intended as an aid in the diagnosis of influenza from Nasopharyngeal swab specimens and should not be used as a sole basis for treatment. Nasal washings and aspirates are unacceptable for Xpert Xpress SARS-CoV-2/FLU/RSV testing.  Fact Sheet for Patients: BloggerCourse.com  Fact Sheet for Healthcare Providers: SeriousBroker.it  This test is not yet approved or cleared by the [place]  FDA and has been authorized for detection and/or diagnosis of SARS-CoV-2 by FDA under an Emergency Use Authorization (EUA). This EUA will remain in effect (meaning this test can be used) for the duration of the COVID-19 declaration under Section 564(b)(1) of the Act, 21 U.S.C. section 360bbb-3(b)(1), unless the authorization is terminated or revoked.     Resp Syncytial Virus by PCR NEGATIVE NEGATIVE Final    Comment: (NOTE) Fact Sheet for Patients: BloggerCourse.com  Fact Sheet for Healthcare Providers: SeriousBroker.it  This test is not yet approved or cleared by the [place]  FDA and has been authorized for detection and/or diagnosis of SARS-CoV-2 by FDA under an Emergency Use  Authorization (EUA). This EUA will remain in effect (meaning this test can be used) for the duration of the COVID-19 declaration under Section 564(b)(1) of the Act, 21 U.S.C. section 360bbb-3(b)(1), unless the authorization is terminated or revoked.  Performed at [facility], [address], [place], [place] [address]   Blood Culture (routine x 2)     Status: None   Collection Time: 06/02/24 12:45 PM   Specimen: BLOOD  Result Value Ref Range Status   Specimen Description   Final    BLOOD BLOOD LEFT ARM Performed at [facility], [address] [place], [place] [address]    Special Requests   Final    BOTTLES DRAWN AEROBIC ONLY Blood Culture adequate volume Performed at [facility], [address], [place], [place] [address]    Culture   Final    NO GROWTH 5 DAYS Performed at [facility], [address], [place], [place] [address]    Report Status 06/07/2024 FINAL  Final     Studies: DG Abd Portable 1V Result Date: 06/07/2024 CLINICAL DATA:  Follow-up ileus 24 hour film EXAM: PORTABLE ABDOMEN - 1 VIEW COMPARISON:  Film from earlier in the same day. FINDINGS: Administered contrast lies within multiple dilated loops of small bowel. Some contrast has reached the colon although the majority lies within the small bowel. No free air is noted. Gastric catheter is noted in place. IMPRESSION: Persistent small bowel dilatation with contrast within. Some colonic contrast is noted. Electronically Signed   By: Oneil Devonshire M.D.   On: 06/07/2024 20:02   DG Abd Portable 1V-Small Bowel Obstruction Protocol-initial, 8 hr delay Result Date: 06/07/2024  CLINICAL DATA:  8 hour post enteric contrast small-bowel obstruction follow-up exam. EXAM: PORTABLE ABDOMEN - 1 VIEW COMPARISON:  Portable abdomen yesterday 10:32 a.m., portable abdomen 06/05/2024 FINDINGS: 12:21 a.m. NGT curves to the left and up within the stomach with the tip in the  proximal fundus. Small volume of enteric contrast remains in the gastric fundus and neck trauma. Faint dilute contrast partially opacifies dilated small bowel. There are some decompressed segments in true pelvis but elsewhere the small bowel is diffusely dilated, with little change over the previous 3 days films up to 4.8 cm. There is scattered barium precipitate in left hemiabdomen. Faint contrast seen in the cecum, proximal ascending colon and is present as far distally as distal descending colon. There is no supine evidence of free air. Small pleural effusions appear similar. Dextroscoliosis and degenerative change lumbar spine osteopenia. IMPRESSION: 1. Persistent diffuse small bowel dilatation with little change over the previous 3 days. 2. Faint contrast seen in the cecum, proximal ascending colon and distal descending colon. 3. Small pleural effusions appear similar. 4. NGT curves to the left and up within the stomach with the tip in the proximal fundus. Electronically Signed   By: Francis Quam M.D.   On: 06/07/2024 00:42   DG Abd 1 View Result Date: 06/06/2024 CLINICAL DATA:  NG placement. EXAM: ABDOMEN - 1 VIEW COMPARISON:  Radiograph dated 06/05/2024. FINDINGS: Enteric tube with tip in the left upper abdomen in similar position in the region of the gastric fundus. Dilated small bowel loops again noted. IMPRESSION: Enteric tube with tip in the region of the gastric fundus. Electronically Signed   By: Vanetta Chou M.D.   On: 06/06/2024 14:34      Vernal Alstrom, MD  [facility] 06/08/2024  If 7PM-7AM, please contact night-coverage

## 2024-06-08 NOTE — Progress Notes (Signed)
 Progress Note     Subjective: Pt had some more small BMs but nothing significant. Persistent pain and distention. Tried to ambulate more yesterday but felt weak. Son is concerned that her mental status appears to be worsening. He can't remember if she had prior abdominal surgery and she can't tell me.   Objective: Vital signs in last 24 hours: Temp:  [98.3 F (36.8 C)-99.3 F (37.4 C)] 98.3 F (36.8 C) (08/26 0513) Pulse Rate:  [92-99] 93 (08/26 0513) Resp:  [16-18] 18 (08/26 0513) BP: (114-139)/(102-103) 114/103 (08/26 0513) SpO2:  [91 %-98 %] 95 % (08/26 0513) Last BM Date : 06/07/24  Intake/Output from previous day: 08/25 0701 - 08/26 0700 In: 3637.3 [P.O.:60; I.V.:2665.1; IV Piggyback:912.1] Out: 1700 [Urine:700; Emesis/NG output:1000] Intake/Output this shift: No intake/output data recorded.  PE: General: pleasant, WD, WN female who is laying in bed in NAD Heart: regular, rate, and rhythm.  Lungs: Respiratory effort nonlabored Abd: soft, distended, mild generalized TTP without peritonitis, NGT present and thin bilious drainage in cannister, no surgical scars noted on abdomen   Lab Results:  Recent Labs    06/07/24 0633 06/08/24 0531  WBC 7.8 12.1*  HGB 11.8* 12.4  HCT 38.1 39.4  PLT 309 324   BMET Recent Labs    06/07/24 0633 06/08/24 0531  NA 139 138  K 3.1* 3.3*  CL 106 105  CO2 27 27  GLUCOSE 99 108*  BUN 11 6*  CREATININE 0.61 0.50  CALCIUM  7.7* 7.7*   PT/INR No results for input(s): LABPROT, INR in the last 72 hours. CMP     Component Value Date/Time   NA 138 06/08/2024 0531   NA 144 03/02/2024 1554   K 3.3 (L) 06/08/2024 0531   CL 105 06/08/2024 0531   CO2 27 06/08/2024 0531   GLUCOSE 108 (H) 06/08/2024 0531   BUN 6 (L) 06/08/2024 0531   BUN 20 03/02/2024 1554   CREATININE 0.50 06/08/2024 0531   CALCIUM  7.7 (L) 06/08/2024 0531   PROT 5.0 (L) 06/08/2024 0531   PROT 6.1 03/02/2024 1554   ALBUMIN  2.5 (L) 06/08/2024 0531    ALBUMIN  3.9 03/02/2024 1554   AST 30 06/08/2024 0531   ALT 21 06/08/2024 0531   ALKPHOS 64 06/08/2024 0531   BILITOT 0.3 06/08/2024 0531   BILITOT 0.2 03/02/2024 1554   GFRNONAA >60 06/08/2024 0531   GFRAA >60 01/22/2020 0226   Lipase     Component Value Date/Time   LIPASE 27 05/31/2024 2054       Studies/Results: US  EKG SITE RITE Result Date: 06/08/2024 If Site Rite image not attached, placement could not be confirmed due to current cardiac rhythm.  DG Abd Portable 1V Result Date: 06/08/2024 EXAM: 1 VIEW XRAY OF THE ABDOMEN 06/08/2024 05:23:00 AM COMPARISON: 06/07/2024 CLINICAL HISTORY: [id number] SBO (small bowel obstruction) (HCC) [id number]. Small bowel obstruction FINDINGS: BOWEL: Gas distended mid abdominal small bowel loops slightly increased in degree of distention since prior study. There is mild gaseous distention of the proximal colon. SOFT TISSUES: No opaque urinary calculi. BONES: Lumbar Dextroscoliosis with multilevel spondylitic change. No acute osseous abnormality. IMPRESSION: 1. Small bowel obstruction with gas distended mid abdominal small bowel loops, slightly increased in degree of distention since prior study. 2.   mild gaseous distention of the proximal colon. Electronically signed by: Katheleen Faes MD 06/08/2024 09:26 AM EDT RP Workstation: [id number]   DG Abd Portable 1V Result Date: 06/07/2024 CLINICAL DATA:  Follow-up ileus 24 hour film EXAM: PORTABLE ABDOMEN -  1 VIEW COMPARISON:  Film from earlier in the same day. FINDINGS: Administered contrast lies within multiple dilated loops of small bowel. Some contrast has reached the colon although the majority lies within the small bowel. No free air is noted. Gastric catheter is noted in place. IMPRESSION: Persistent small bowel dilatation with contrast within. Some colonic contrast is noted. Electronically Signed   By: Oneil Devonshire M.D.   On: 06/07/2024 20:02   DG Abd Portable 1V-Small Bowel Obstruction Protocol-initial, 8 hr  delay Result Date: 06/07/2024 CLINICAL DATA:  8 hour post enteric contrast small-bowel obstruction follow-up exam. EXAM: PORTABLE ABDOMEN - 1 VIEW COMPARISON:  Portable abdomen yesterday 10:32 a.m., portable abdomen 06/05/2024 FINDINGS: 12:21 a.m. NGT curves to the left and up within the stomach with the tip in the proximal fundus. Small volume of enteric contrast remains in the gastric fundus and neck trauma. Faint dilute contrast partially opacifies dilated small bowel. There are some decompressed segments in true pelvis but elsewhere the small bowel is diffusely dilated, with little change over the previous 3 days films up to 4.8 cm. There is scattered barium precipitate in left hemiabdomen. Faint contrast seen in the cecum, proximal ascending colon and is present as far distally as distal descending colon. There is no supine evidence of free air. Small pleural effusions appear similar. Dextroscoliosis and degenerative change lumbar spine osteopenia. IMPRESSION: 1. Persistent diffuse small bowel dilatation with little change over the previous 3 days. 2. Faint contrast seen in the cecum, proximal ascending colon and distal descending colon. 3. Small pleural effusions appear similar. 4. NGT curves to the left and up within the stomach with the tip in the proximal fundus. Electronically Signed   By: Francis Quam M.D.   On: 06/07/2024 00:42   DG Abd 1 View Result Date: 06/06/2024 CLINICAL DATA:  NG placement. EXAM: ABDOMEN - 1 VIEW COMPARISON:  Radiograph dated 06/05/2024. FINDINGS: Enteric tube with tip in the left upper abdomen in similar position in the region of the gastric fundus. Dilated small bowel loops again noted. IMPRESSION: Enteric tube with tip in the region of the gastric fundus. Electronically Signed   By: Vanetta Chou M.D.   On: 06/06/2024 14:34    Anti-infectives: Anti-infectives (From admission, onward)    Start     Dose/Rate Route Frequency Ordered Stop   06/03/24 1130  vancomycin   (VANCOCIN ) IVPB 1000 mg/200 mL premix  Status:  Discontinued        1,000 mg 200 mL/hr over 60 Minutes Intravenous Every 24 hours 06/02/24 1149 06/03/24 0949   06/03/24 0000  ceFEPIme  (MAXIPIME ) 2 g in sodium chloride  0.9 % 100 mL IVPB        2 g 200 mL/hr over 30 Minutes Intravenous Every 12 hours 06/02/24 1149 06/08/24 2359   06/02/24 2200  metroNIDAZOLE  (FLAGYL ) IVPB 500 mg        500 mg 100 mL/hr over 60 Minutes Intravenous Every 12 hours 06/02/24 1113 06/09/24 2159   06/02/24 1100  ceFEPIme  (MAXIPIME ) 2 g in sodium chloride  0.9 % 100 mL IVPB        2 g 200 mL/hr over 30 Minutes Intravenous  Once 06/02/24 1045 06/02/24 1449   06/02/24 1100  vancomycin  (VANCOCIN ) IVPB 1000 mg/200 mL premix        1,000 mg 200 mL/hr over 60 Minutes Intravenous  Once 06/02/24 1045 06/02/24 1233   06/02/24 1100  metroNIDAZOLE  (FLAGYL ) IVPB 500 mg        500 mg 100  mL/hr over 60 Minutes Intravenous  Once 06/02/24 1045 06/02/24 1818        Assessment/Plan Ileus vs PSBO - CT AP 8/19 with no acute findings - KUB 8/22 with small bowel distention concerning for SBO - NGT placed and large volume decompressed  - SBO protocol given and 8h delay with contrast in colon but persistent small bowel dilation - more suggestive of motility issue  - continues to have dilated bowel on KUB and not really making progress - ok to clamp NGT to mobilize but would keep to LIWS - repeat CT AP with PO and IV contrast today  - no real PO intake since 8/21 - PICC and TPN - keep K>4.0, Mg >2.0 and Phos >3.0 to optimize bowel function  - no indication for emergent surgical intervention at this time  FEN: NPO, IVF per [facility], NGT to LIWS VTE: ok to have SQH or LMWH from surgical standpoint ID: cefepime /flagyl  for bacteremia   - per [facility] -  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  This care required moderate level of medical decision making.    Burnard JONELLE Louder, [facility] 06/08/2024, 9:41 AM Please see Amion for pager number during day hours 7:00am-4:30pm

## 2024-06-08 NOTE — Progress Notes (Signed)
 Called about CT scan from this afternoon.  I reviewed the scan myself.  I compared it to her old CT scan from 06/01/2024.  There is significant inflammation of small bowel loops in the pelvis.SABRA  She was admitted with diverticulitis initially.  I do not see any abscess per se.  The inflammation seems to have progressed some.  She has contrast in her colon.  It is unlikely she has a midgut volvulus as reported in the CT dictation with her exam findings and after reviewing the CT scan.  Question diverticulitis with partial small bowel obstruction from progressive inflammation.  She is not tachycardic, hypotensive or tachypneic.  Her white count was 12,000 this morning.  Her examination shows tenderness in her left lower quadrant without diffuse peritonitis.  She has mild distention but no severe distention rebound or guarding.  She is awake and alert.  She is confused at baseline but can answer my question and is pleasant.  She says her pain is a 2.  Discussed CT findings with family.  I wonder if laparoscopy would be helpful in the next 24 hours to discern her underlying pathology in her abdomen given her prolonged hospital stay.  Can discuss with Dr. Curvin in the morning.  No acute indication for surgery but I explained to the family that if her condition were to acutely worsen overnight, she may require laparotomy overnight.   Questions were answered.  They voiced understanding.  Continue to monitor.

## 2024-06-08 NOTE — Progress Notes (Signed)
 Peripherally Inserted Central Catheter Placement  The IV Nurse has discussed with the patient and/or persons authorized to consent for the patient, the purpose of this procedure and the potential benefits and risks involved with this procedure.  The benefits include less needle sticks, lab draws from the catheter, and the patient may be discharged home with the catheter. Risks include, but not limited to, infection, bleeding, blood clot (thrombus formation), and puncture of an artery; nerve damage and irregular heartbeat and possibility to perform a PICC exchange if needed/ordered by physician.  Alternatives to this procedure were also discussed.  Bard Power PICC patient education guide, fact sheet on infection prevention and patient information card has been provided to patient /or left at bedside.   Son gave consent for PICC placement PICC Placement Documentation  PICC Double Lumen 06/08/24 Right Basilic 37 cm 0 cm (Active)  Indication for Insertion or Continuance of Line Administration of hyperosmolar/irritating solutions (i.e. TPN, Vancomycin , etc.) 06/08/24 1346  Exposed Catheter (cm) 0 cm 06/08/24 1346  Site Assessment Clean, Dry, Intact 06/08/24 1346  Lumen #1 Status Flushed;Blood return noted;Saline locked 06/08/24 1346  Lumen #2 Status Flushed;Blood return noted;Saline locked 06/08/24 1346  Dressing Type Transparent 06/08/24 1346  Dressing Status Antimicrobial disc/dressing in place 06/08/24 1346  Line Care Connections checked and tightened 06/08/24 1346  Line Adjustment (NICU/IV Team Only) No 06/08/24 1346  Dressing Intervention New dressing 06/08/24 1346  Dressing Change Due 06/15/24 06/08/24 1346       Leighann Amadon Ramos 06/08/2024, 1:48 PM

## 2024-06-09 ENCOUNTER — Inpatient Hospital Stay (HOSPITAL_COMMUNITY): Admitting: Anesthesiology

## 2024-06-09 ENCOUNTER — Encounter (HOSPITAL_COMMUNITY): Payer: Self-pay

## 2024-06-09 ENCOUNTER — Other Ambulatory Visit: Payer: Self-pay

## 2024-06-09 ENCOUNTER — Encounter (HOSPITAL_COMMUNITY)
Admission: EM | Disposition: A | Discharge status: Hospice | Payer: Self-pay | Source: Home / Self Care | Attending: Internal Medicine

## 2024-06-09 DIAGNOSIS — Z87891 Personal history of nicotine dependence: Secondary | ICD-10-CM | POA: Diagnosis not present

## 2024-06-09 DIAGNOSIS — E785 Hyperlipidemia, unspecified: Secondary | ICD-10-CM | POA: Diagnosis not present

## 2024-06-09 DIAGNOSIS — R6510 Systemic inflammatory response syndrome (SIRS) of non-infectious origin without acute organ dysfunction: Secondary | ICD-10-CM | POA: Diagnosis not present

## 2024-06-09 DIAGNOSIS — K6389 Other specified diseases of intestine: Secondary | ICD-10-CM

## 2024-06-09 DIAGNOSIS — I739 Peripheral vascular disease, unspecified: Secondary | ICD-10-CM | POA: Diagnosis not present

## 2024-06-09 DIAGNOSIS — J449 Chronic obstructive pulmonary disease, unspecified: Secondary | ICD-10-CM | POA: Diagnosis not present

## 2024-06-09 DIAGNOSIS — K5700 Diverticulitis of small intestine with perforation and abscess without bleeding: Secondary | ICD-10-CM | POA: Diagnosis present

## 2024-06-09 HISTORY — PX: LAPAROSCOPY: SHX197

## 2024-06-09 LAB — COMPREHENSIVE METABOLIC PANEL WITH GFR
ALT: 17 U/L (ref 0–44)
AST: 24 U/L (ref 15–41)
Albumin: 2.9 g/dL — ABNORMAL LOW (ref 3.5–5.0)
Alkaline Phosphatase: 74 U/L (ref 38–126)
Anion gap: 12 (ref 5–15)
BUN: <5 mg/dL — ABNORMAL LOW (ref 8–23)
CO2: 27 mmol/L (ref 22–32)
Calcium: 8.1 mg/dL — ABNORMAL LOW (ref 8.9–10.3)
Chloride: 105 mmol/L (ref 98–111)
Creatinine, Ser: 0.4 mg/dL — ABNORMAL LOW (ref 0.44–1.00)
GFR, Estimated: >60 mL/min (ref >60)
Glucose, Bld: 113 mg/dL — ABNORMAL HIGH (ref 70–99)
Potassium: 3.5 mmol/L (ref 3.5–5.1)
Sodium: 144 mmol/L (ref 135–145)
Total Bilirubin: 0.3 mg/dL (ref 0.0–1.2)
Total Protein: 5 g/dL — ABNORMAL LOW (ref 6.5–8.1)

## 2024-06-09 LAB — PHOSPHORUS: Phosphorus: 2.3 mg/dL — ABNORMAL LOW (ref 2.5–4.6)

## 2024-06-09 LAB — GLUCOSE, CAPILLARY
Glucose-Capillary: 125 mg/dL — ABNORMAL HIGH (ref 70–99)
Glucose-Capillary: 127 mg/dL — ABNORMAL HIGH (ref 70–99)
Glucose-Capillary: 153 mg/dL — ABNORMAL HIGH (ref 70–99)
Glucose-Capillary: 197 mg/dL — ABNORMAL HIGH (ref 70–99)
Glucose-Capillary: 236 mg/dL — ABNORMAL HIGH (ref 70–99)

## 2024-06-09 LAB — CBC
HCT: 37.4 % (ref 36.0–46.0)
Hemoglobin: 11.9 g/dL — ABNORMAL LOW (ref 12.0–15.0)
MCH: 28.7 pg (ref 26.0–34.0)
MCHC: 31.8 g/dL (ref 30.0–36.0)
MCV: 90.1 fL (ref 80.0–100.0)
Platelets: 318 K/uL (ref 150–400)
RBC: 4.15 MIL/uL (ref 3.87–5.11)
RDW: 14.5 % (ref 11.5–15.5)
WBC: 12.1 K/uL — ABNORMAL HIGH (ref 4.0–10.5)
nRBC: 0 % (ref 0.0–0.2)

## 2024-06-09 LAB — MAGNESIUM: Magnesium: 2 mg/dL (ref 1.7–2.4)

## 2024-06-09 SURGERY — LAPAROSCOPY, DIAGNOSTIC
Anesthesia: General

## 2024-06-09 MED ORDER — ONDANSETRON HCL 4 MG/2ML IJ SOLN: 4.0000 mg | Freq: Once | INTRAMUSCULAR | Status: DC | PRN

## 2024-06-09 MED ORDER — ROCURONIUM BROMIDE 10 MG/ML (PF) SYRINGE
PREFILLED_SYRINGE | INTRAVENOUS | Status: AC
Start: 2024-06-09 — End: 2024-06-09
  Filled 2024-06-09: qty 10

## 2024-06-09 MED ORDER — FENTANYL CITRATE (PF) 100 MCG/2ML IJ SOLN
INTRAMUSCULAR | Status: DC | PRN
  Administered 2024-06-09: 50 ug via INTRAVENOUS

## 2024-06-09 MED ORDER — ONDANSETRON HCL 4 MG/2ML IJ SOLN
INTRAMUSCULAR | Status: AC
  Filled 2024-06-09: qty 2

## 2024-06-09 MED ORDER — LACTATED RINGERS IV SOLN: INTRAVENOUS | Status: DC | PRN

## 2024-06-09 MED ORDER — HYDROMORPHONE HCL 1 MG/ML IJ SOLN
INTRAMUSCULAR | Status: DC | PRN
  Administered 2024-06-09: .2 mg via INTRAVENOUS
  Administered 2024-06-09 (×2): .4 mg via INTRAVENOUS

## 2024-06-09 MED ORDER — POTASSIUM PHOSPHATES 15 MMOLE/5ML IV SOLN
15.0000 mmol | Freq: Once | INTRAVENOUS | Status: AC
  Administered 2024-06-09: 15 mmol via INTRAVENOUS
  Filled 2024-06-09: qty 5

## 2024-06-09 MED ORDER — ALBUMIN HUMAN 5 % IV SOLN: INTRAVENOUS | Status: DC | PRN

## 2024-06-09 MED ORDER — 0.9 % SODIUM CHLORIDE (POUR BTL) OPTIME
TOPICAL | Status: DC | PRN
  Administered 2024-06-09 (×3): 1000 mL

## 2024-06-09 MED ORDER — HYDROMORPHONE HCL 1 MG/ML IJ SOLN: 0.2500 mg | INTRAMUSCULAR | Status: DC | PRN

## 2024-06-09 MED ORDER — ACETAMINOPHEN 10 MG/ML IV SOLN
1000.0000 mg | Freq: Once | INTRAVENOUS | Status: DC | PRN
  Administered 2024-06-09: 1000 mg via INTRAVENOUS

## 2024-06-09 MED ORDER — FENTANYL CITRATE (PF) 100 MCG/2ML IJ SOLN
INTRAMUSCULAR | Status: AC
Start: 2024-06-09 — End: 2024-06-09
  Filled 2024-06-09: qty 2

## 2024-06-09 MED ORDER — MORPHINE SULFATE (PF) 2 MG/ML IV SOLN
1.0000 mg | INTRAVENOUS | Status: DC | PRN
  Administered 2024-06-09 – 2024-06-12 (×7): 2 mg via INTRAVENOUS
  Administered 2024-06-13: 1 mg via INTRAVENOUS
  Administered 2024-06-13 – 2024-06-14 (×4): 2 mg via INTRAVENOUS
  Administered 2024-06-14: 1 mg via INTRAVENOUS
  Administered 2024-06-14 – 2024-06-19 (×12): 2 mg via INTRAVENOUS
  Administered 2024-06-19: 1 mg via INTRAVENOUS
  Administered 2024-06-21 (×2): 2 mg via INTRAVENOUS
  Administered 2024-06-21: 4 mg via INTRAVENOUS
  Administered 2024-06-22 – 2024-06-23 (×2): 2 mg via INTRAVENOUS
  Administered 2024-06-23: 4 mg via INTRAVENOUS
  Administered 2024-06-23 – 2024-06-24 (×2): 2 mg via INTRAVENOUS
  Administered 2024-06-24: 4 mg via INTRAVENOUS
  Filled 2024-06-09: qty 1
  Filled 2024-06-09: qty 2
  Filled 2024-06-09 (×9): qty 1
  Filled 2024-06-09 (×2): qty 2
  Filled 2024-06-09 (×22): qty 1

## 2024-06-09 MED ORDER — AMISULPRIDE (ANTIEMETIC) 5 MG/2ML IV SOLN: 10.0000 mg | Freq: Once | INTRAVENOUS | Status: DC | PRN

## 2024-06-09 MED ORDER — OXYCODONE HCL 5 MG PO TABS: 5.0000 mg | ORAL_TABLET | Freq: Once | ORAL | Status: DC | PRN

## 2024-06-09 MED ORDER — DEXAMETHASONE SODIUM PHOSPHATE 10 MG/ML IJ SOLN
INTRAMUSCULAR | Status: DC | PRN
Start: 2024-06-09 — End: 2024-06-09
  Administered 2024-06-09: 4 mg via INTRAVENOUS

## 2024-06-09 MED ORDER — DEXMEDETOMIDINE HCL IN NACL 80 MCG/20ML IV SOLN
INTRAVENOUS | Status: DC | PRN
  Administered 2024-06-09 (×3): 4 ug via INTRAVENOUS

## 2024-06-09 MED ORDER — PROPOFOL 10 MG/ML IV BOLUS
INTRAVENOUS | Status: AC
Start: 2024-06-09 — End: 2024-06-09
  Filled 2024-06-09: qty 20

## 2024-06-09 MED ORDER — SUGAMMADEX SODIUM 200 MG/2ML IV SOLN
INTRAVENOUS | Status: DC | PRN
  Administered 2024-06-09: 200 mg via INTRAVENOUS

## 2024-06-09 MED ORDER — SODIUM CHLORIDE 0.9 % IV SOLN
2.0000 g | Freq: Once | INTRAVENOUS | Status: AC
  Administered 2024-06-09: 2 g via INTRAVENOUS
  Filled 2024-06-09: qty 12.5

## 2024-06-09 MED ORDER — DEXAMETHASONE SODIUM PHOSPHATE 10 MG/ML IJ SOLN
INTRAMUSCULAR | Status: AC
  Filled 2024-06-09: qty 1

## 2024-06-09 MED ORDER — SODIUM CHLORIDE 0.9 % IV SOLN: 1.0000 g | Freq: Two times a day (BID) | INTRAVENOUS | Status: DC

## 2024-06-09 MED ORDER — HYDROMORPHONE HCL 2 MG/ML IJ SOLN
INTRAMUSCULAR | Status: AC
  Filled 2024-06-09: qty 1

## 2024-06-09 MED ORDER — KETAMINE HCL 50 MG/5ML IJ SOSY
PREFILLED_SYRINGE | INTRAMUSCULAR | Status: AC
Start: 2024-06-09 — End: 2024-06-09
  Filled 2024-06-09: qty 5

## 2024-06-09 MED ORDER — LIDOCAINE HCL (CARDIAC) PF 100 MG/5ML IV SOSY
PREFILLED_SYRINGE | INTRAVENOUS | Status: DC | PRN
  Administered 2024-06-09: 100 mg via INTRAVENOUS

## 2024-06-09 MED ORDER — KETAMINE HCL 10 MG/ML IJ SOLN: INTRAMUSCULAR | Status: DC | PRN

## 2024-06-09 MED ORDER — PROPOFOL 10 MG/ML IV BOLUS
INTRAVENOUS | Status: DC | PRN
  Administered 2024-06-09: 100 ug/kg/min via INTRAVENOUS
  Administered 2024-06-09: 150 ug/kg/min via INTRAVENOUS

## 2024-06-09 MED ORDER — ACETAMINOPHEN 10 MG/ML IV SOLN
INTRAVENOUS | Status: AC
  Filled 2024-06-09: qty 100

## 2024-06-09 MED ORDER — BUPIVACAINE-EPINEPHRINE (PF) 0.25% -1:200000 IJ SOLN
INTRAMUSCULAR | Status: AC
Start: 2024-06-09 — End: 2024-06-09
  Filled 2024-06-09: qty 30

## 2024-06-09 MED ORDER — THIAMINE HCL 100 MG/ML IJ SOLN
100.0000 mg | Freq: Every day | INTRAMUSCULAR | Status: AC
  Administered 2024-06-09 – 2024-06-13 (×5): 100 mg via INTRAVENOUS
  Filled 2024-06-09 (×5): qty 2

## 2024-06-09 MED ORDER — DEXTROSE-SODIUM CHLORIDE 5-0.45 % IV SOLN: INTRAVENOUS | Status: AC

## 2024-06-09 MED ORDER — METRONIDAZOLE 500 MG/100ML IV SOLN
500.0000 mg | Freq: Two times a day (BID) | INTRAVENOUS | Status: DC
  Administered 2024-06-09 – 2024-06-16 (×13): 500 mg via INTRAVENOUS
  Filled 2024-06-09 (×13): qty 100

## 2024-06-09 MED ORDER — OXYCODONE HCL 5 MG/5ML PO SOLN: 5.0000 mg | Freq: Once | ORAL | Status: DC | PRN

## 2024-06-09 MED ORDER — KETAMINE HCL 50 MG/5ML IJ SOSY
PREFILLED_SYRINGE | INTRAMUSCULAR | Status: DC | PRN
Start: 2024-06-09 — End: 2024-06-09
  Administered 2024-06-09: 10 mg via INTRAVENOUS

## 2024-06-09 MED ORDER — SUGAMMADEX SODIUM 200 MG/2ML IV SOLN
INTRAVENOUS | Status: AC
  Filled 2024-06-09: qty 2

## 2024-06-09 MED ORDER — LACTATED RINGERS IR SOLN
Status: DC | PRN
Start: 2024-06-09 — End: 2024-06-09
  Administered 2024-06-09: 1000 mL

## 2024-06-09 MED ORDER — SODIUM CHLORIDE 0.9 % IV SOLN
2.0000 g | Freq: Two times a day (BID) | INTRAVENOUS | Status: DC
  Administered 2024-06-10 – 2024-06-16 (×13): 2 g via INTRAVENOUS
  Filled 2024-06-09 (×13): qty 12.5

## 2024-06-09 MED ORDER — PHENYLEPHRINE 80 MCG/ML (10ML) SYRINGE FOR IV PUSH (FOR BLOOD PRESSURE SUPPORT)
PREFILLED_SYRINGE | INTRAVENOUS | Status: DC | PRN
  Administered 2024-06-09 (×3): 80 ug via INTRAVENOUS

## 2024-06-09 MED ORDER — ROCURONIUM BROMIDE 100 MG/10ML IV SOLN
INTRAVENOUS | Status: DC | PRN
  Administered 2024-06-09: 10 mg via INTRAVENOUS
  Administered 2024-06-09: 40 mg via INTRAVENOUS

## 2024-06-09 MED ORDER — TRAVASOL 10 % IV SOLN
INTRAVENOUS | Status: AC
  Filled 2024-06-09: qty 864

## 2024-06-09 MED ORDER — BUPIVACAINE-EPINEPHRINE 0.25% -1:200000 IJ SOLN
INTRAMUSCULAR | Status: DC | PRN
  Administered 2024-06-09: 12 mL

## 2024-06-09 MED ORDER — LIDOCAINE HCL (PF) 2 % IJ SOLN
INTRAMUSCULAR | Status: AC
  Filled 2024-06-09: qty 5

## 2024-06-09 SURGICAL SUPPLY — 40 items
BAG COUNTER SPONGE SURGICOUNT (BAG) IMPLANT
BENZOIN TINCTURE PRP APPL 2/3 (GAUZE/BANDAGES/DRESSINGS) IMPLANT
BNDG GAUZE DERMACEA FLUFF 4 (GAUZE/BANDAGES/DRESSINGS) IMPLANT
DERMABOND ADVANCED .7 DNX12 (GAUZE/BANDAGES/DRESSINGS) IMPLANT
DRAPE WARM FLUID 44X44 (DRAPES) IMPLANT
ELECT PENCIL ROCKER SW 15FT (MISCELLANEOUS) IMPLANT
ELECT REM PT RETURN 15FT ADLT (MISCELLANEOUS) ×1 IMPLANT
GAUZE PAD ABD 8X10 STRL (GAUZE/BANDAGES/DRESSINGS) IMPLANT
GLOVE BIO SURGEON STRL SZ7.5 (GLOVE) ×2 IMPLANT
GLOVE BIOGEL PI IND STRL 7.0 (GLOVE) ×1 IMPLANT
GOWN STRL REUS W/ TWL LRG LVL3 (GOWN DISPOSABLE) ×1 IMPLANT
GOWN STRL REUS W/ TWL XL LVL3 (GOWN DISPOSABLE) ×1 IMPLANT
HANDLE SUCTION POOLE (INSTRUMENTS) IMPLANT
IRRIGATION SUCT STRKRFLW 2 WTP (MISCELLANEOUS) IMPLANT
KIT BASIN OR (CUSTOM PROCEDURE TRAY) ×1 IMPLANT
KIT TURNOVER KIT A (KITS) ×1 IMPLANT
LIGASURE IMPACT 36 18CM CVD LR (INSTRUMENTS) IMPLANT
RELOAD STAPLE 75 3.8 BLU REG (ENDOMECHANICALS) IMPLANT
SET TUBE SMOKE EVAC HIGH FLOW (TUBING) ×1 IMPLANT
SHEARS HARMONIC 36 ACE (MISCELLANEOUS) IMPLANT
SLEEVE Z-THREAD 5X100MM (TROCAR) IMPLANT
SOLUTION ANTFG W/FOAM PAD STRL (MISCELLANEOUS) ×1 IMPLANT
SPIKE FLUID TRANSFER (MISCELLANEOUS) IMPLANT
SPONGE T-LAP 18X18 ~~LOC~~+RFID (SPONGE) IMPLANT
STAPLER GUN LINEAR PROX 60 (STAPLE) IMPLANT
STAPLER PROXIMATE 75MM BLUE (STAPLE) IMPLANT
STAPLER SKIN PROX 35W (STAPLE) IMPLANT
STRIP CLOSURE SKIN 1/2X4 (GAUZE/BANDAGES/DRESSINGS) IMPLANT
SUT PDS AB 1 TP1 54 (SUTURE) IMPLANT
SUT PDS AB 1 TP1 96 (SUTURE) IMPLANT
SUT SILK 2 0 SH CR/8 (SUTURE) IMPLANT
SUT VIC AB 4-0 PS2 27 (SUTURE) IMPLANT
TOWEL OR 17X26 10 PK STRL BLUE (TOWEL DISPOSABLE) ×1 IMPLANT
TRAY FOLEY MTR SLVR 16FR STAT (SET/KITS/TRAYS/PACK) IMPLANT
TRAY LAPAROSCOPIC (CUSTOM PROCEDURE TRAY) ×1 IMPLANT
TROCAR 11X100 Z THREAD (TROCAR) IMPLANT
TROCAR BALLN 12MMX100 BLUNT (TROCAR) ×1 IMPLANT
TROCAR Z-THREAD OPTICAL 5X100M (TROCAR) IMPLANT
TROCAR Z-THREAD SLEEVE 11X100 (TROCAR) IMPLANT
WATER STERILE IRR 1000ML POUR (IV SOLUTION) ×1 IMPLANT

## 2024-06-09 NOTE — Progress Notes (Addendum)
 PHARMACY - TOTAL PARENTERAL NUTRITION CONSULT NOTE   Indication: Prolonged ileus  Patient Measurements: Height: 5' 2 (157.5 cm) Weight: 61.3 kg (135 lb 2.3 oz) IBW/kg (Calculated) : 50.1 TPN AdjBW (KG): 61.3 Body mass index is 24.72 kg/m. Usual Weight:    Assessment: Presented to ED with N/V/D and abdominal pain x 3d on 05/31/24. CT negative. Possibly from diverticulitis and placed on Cefepime /Flagyl . Concern for SBO, NGT placed. Continues to have persistent pain and distention. No significant intake since 8/21. Pharmacy consulted to start TPN on 8/26.  Remains NPO with plan for continued NG with bowel rest. Surgery planning possible diagnostic laparoscopy.  Glucose / Insulin : No h/o DM -CBGs yesterday trending up after starting TPN -3u SSI/24 hrs Electrolytes: phos 2.3, K 3.5, Mg 2 Renal: Scr <1 Hepatic: LFTs WNL; alb 2.9 Intake / Output; MIVF:  -MIVF at 25 ml/hr -NGT 1 L documented this am -UOP likely not fully captured, 800 ml this am -LBM 8/27  GI Imaging: 8/19 CT: Scattered colonic diverticulosis. No active diverticulitis.  8/22, 8/23, 8/25, 8/26 Abd Xray: all SBO 8/26 CT: severely thickened and inflamed central pelvic loops of small bowel concerning for ischemia. Concern for mesenteric volvulus or closed loop obstruction. GI Surgeries / Procedures:  8/27 Planning possible diagnostic laparoscopy  Central access: PICC TPN start date: 8/26  Nutritional Goals: Goal TPN rate is 80 mL/hr (provides 86 g of protein and  1759 kcals per day)  RD Assessment: Kcal 1650-1850 Protein 75-90 g Fluid 1.8 L/day  Current Nutrition:  NPO  Plan:  -K phos  15 mmol IV x 1 -At 1800, increase TPN to goal 80 mL/hr -Electrolytes in TPN: Na 50 mEq/L, K 50 mEq/L, Ca 5 mEq/L, Mg 5 mEq/L, and Phos 15 mmol/L. Cl:Ac 1:1 -Add standard MVI and trace elements to TPN -Start thiamine  100 mg IV x 5 days for refeeding risk -Continue Sensitive q6h SSI and adjust as needed  -Stop MIVF tonight  when TPN advanced to goal -Monitor TPN labs on Mon/Thurs, and PRN    Stefano MARLA Bologna, PharmD, BCPS Clinical Pharmacist 06/09/2024 9:43 AM

## 2024-06-09 NOTE — Op Note (Signed)
 06/09/2024  3:48 PM  PATIENT:  Lindsay Mason  73 y.o. female  PRE-OPERATIVE DIAGNOSIS:  Possible small bowel ischemia  POST-OPERATIVE DIAGNOSIS:  Possible small bowel ischemia  PROCEDURE:  Procedure(s) with comments: DIAGNOSTIC LAPAROSCOPY, EXPLORATORY LAPAROTOMY WITH SMALL BOWEL RESECTION  SURGEON:  Surgeons and Role:    * Curvin Deward MOULD, MD - Primary  PHYSICIAN ASSISTANT:   ASSISTANTS: none   ANESTHESIA:   local and general  EBL:  100cc   BLOOD ADMINISTERED:none  DRAINS: none   LOCAL MEDICATIONS USED:  MARCAINE      SPECIMEN:  Source of Specimen:  small bowel segment  DISPOSITION OF SPECIMEN:  PATHOLOGY  COUNTS:  YES  TOURNIQUET:  * No tourniquets in log *  DICTATION: .Dragon Dictation  After informed consent was obtained the patient was brought to the operating room and placed in the supine position on the operating table.  After adequate induction of general anesthesia the patient's abdomen was prepped with ChloraPrep, allowed to dry, and draped in usual sterile manner.  An appropriate timeout was performed.  A site was chosen to access the abdominal cavity in the left upper quadrant.  This area was infiltrated with quarter percent Marcaine .  A small stab incision was made with a 15 blade knife.  A 5 mm Optiview port and camera were used to bluntly dissected the layers of the abdominal wall under direct vision until access was gained to the abdominal cavity.  The abdomen was then insufflated with carbon oxide without difficulty.  There was a collection of small bowel loops that were densely stuck to the lower midline abdominal wall anteriorly.  A second 5 mm port was placed under direct vision in the left mid abdomen.  I was able to bluntly take down some of the small bowel loops.  In doing so I did encounter an abscess cavity that was evacuated.  The loops of bowel were so densely stuck to the abdominal wall that I did not feel I could safely take anymore down  laparoscopically.  At this point I made a midline incision with a 10 blade knife.  The incision was carried through the skin and subcutaneous tissue sharply with electrocautery until the linea alba is identified.  The linea alba was incised with the electrocautery.  The preperitoneal space was probed bluntly with a hemostat until the peritoneum was opened and access was gained to the abdominal cavity.  The rest of the incision was opened under direct vision.  I was then able to feel the loops of bowel that were stuck to the abdominal wall.  I was able to slowly make progress and finger fracturing the small bowel off the abdominal wall.  The small bowel loops were also stuck to the sigmoid colon.  Once I was finally able to free this collection of bowel from the abdominal wall it appeared as though the sigmoid colon was only secondarily inflamed but the epicenter of the inflammation appeared to be this collection of small bowel loops.  I then chose a site above and below the area of significant inflammation where the small bowel had a more normal appearance.  The mesentery to each of the sites was opened sharply with the electrocautery.  Each site of small bowel was then divided with a single firing of a GIA 75 stapler.  The mesentery to the segment of small bowel was taken down sharply with the LigaSure.  The proximal staple line was marked with a stitch in the segment of  small bowel was removed and sent to pathology for further evaluation.  The proximal and distal segments of small bowel easily approximated each other.  A small opening was made on the antimesenteric side of each limb of small bowel near the staple line.  Each limb of the GIA 75 stapler was then placed down the appropriate limb of small bowel, clamped, and fired thereby creating a nice widely patent enteroenterostomy.  The common opening was closed with a TA 60.  The staple line was then imbricated with multiple 2-0 silk stitches.  A 2-0 silk crotch  stitch was placed.  The mesenteric defect was closed with interrupted 2-0 silk stitches.  Once this was accomplished the anastomosis appeared healthy and widely patent.  The small bowel was placed back in the abdominal cavity.  I was able to run the small bowel from the ligament of Treitz to the ileocecal valve several times and no other abnormalities were noted.  The abdomen was then irrigated with copious amounts of saline.  The remaining small bowel and colon had a normal appearance.  The NG tube was confirmed in the stomach.  At this point the fascia of the anterior abdominal wall was closed with 2 running #1 double-stranded looped PDS sutures.  The subcutaneous tissue was packed with moistened Kerlix gauze.  The 2 port sites were closed with a single staple each.  Sterile dressings were applied.  The patient tolerated the procedure well.  At the end of the case all needle sponge and instrument counts were correct.  The patient was then awakened and taken to recovery in stable condition.  PLAN OF CARE: Admit to inpatient   PATIENT DISPOSITION:  PACU - hemodynamically stable.   Delay start of Pharmacological VTE agent (>24hrs) due to surgical blood loss or risk of bleeding: yes

## 2024-06-09 NOTE — Progress Notes (Signed)
 PROGRESS NOTE  [name] FMW:[id number] DOB: 1950/11/23 DOA: 06/02/2024 PCP: [name], MD   LOS: 7 days   Brief narrative:  Lindsay Mason is a 73 y.o. 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  Patient had lethargy and worsening mental status.  In the ED, patient had slightly low blood pressure.  Labs were notable for WBC elevated at 13.7.  CT abdomen/pelvis done on 06/01/2024 with no acute findings but scattered colonic diverticulosis with small diverticulitis.  Patient was then considered for admission to the hospital for further evaluation and treatment. During hospitalization, patient continued to have abdominal distention and pain so general surgery was consulted.  Currently, being treated for small bowel obstruction/ileus initially conservatively but since that did not improve and had significant CT findings, plan for laparoscopy 06/09/2024.  Assessment/Plan: Principal Problem:   SIRS (systemic inflammatory response syndrome) (HCC) Active Problems:   COPD with emphysema (HCC)   Thoracic aortic atherosclerosis (HCC)   History of essential hypertension   Severe early onset Alzheimer dementia (HCC)   PAD (peripheral artery disease) (HCC)   Confusion and disorientation   Positive blood culture   Nausea and vomiting   Abdominal pain   Hyperlipidemia   Abnormal EKG   Abdominal pain/  Nausea and vomiting likely secondary to acute diverticulitis. Sepsis likely secondary to possible acute diverticulitis Small bowel obstruction/ileus Patient initially had leukocytosis with hypotension and lethargy and clinical suspicion for diverticulitis suggestive of possible sepsis.  Initial CT scan of the  abdomen initially was negative for acute findings but had clinical symptoms of diverticulitis.  Patient was continued on cefepime  and metronidazole .  Blood cultures negative in  5 days.  General surgery was consulted due to persistent abdominal distention and pain.  Initial imaging with x-ray of the abdomen showed possible SBO/ileus.  Patient was then started on conservative treatment with NG tube IV fluid with electrolyte replacement but continued to have pain and distention with NG output.  Repeat CT scan done 06/08/2024 showed significant thickening of the pelvic loops of the small bowel concerning for ischemia.  General surgery has plans for laparoscopic evaluation today.  Currently on D5 water for hydration and nutrition.  Has been started on TPN at this time for nutrition.   Hypophosphatemia.  Phosphorus of 2.3 today.  Will replenish with K-Phos 20 mmol today.  Check levels in AM.  Hypokalemia.  Improved after repletion.  Potassium today at 3.5.  Metabolic encephalopathy on the background of Alzheimer's dementia.   Likely secondary to infection.  On nortriptyline  trazodone  Exelon .  Patient alert awake and Communicative but patient's son but has been having episodes of agitation and mittens.  One-to-one sitter was placed in yesterday.       Positive blood culture with staph species. Contamination was suspected.  Vancomycin  was discontinued.     COPD with emphysema  Continue albuterol  inhaler.  Appears compensated.  On room air     Hyperlipidemia   PAD (peripheral artery disease)    Thoracic aortic atherosclerosis On Lipitor at home, we will hold off for now.     History of essential hypertension Currently not on any medication.  As needed IV antihypertensive is necessary.  Weakness, deconditioning.  Seen by PT and recommend home health PT on discharge.  DVT prophylaxis: SCDs  Start: 06/02/24 1114   Disposition: Home with home health likely in 2 to 3 days,  Status is:  Inpatient Remains inpatient appropriate because: Pending clinical improvement, ongoing SBO/ileus, need for surgical intervention, IV antibiotics, NG tube, IV fluids,    Code Status:     Code Status: Full Code  Family Communication: Spoke with the patient's son at bedside again today.  Consultants: General Surgery  Procedures: NG tube placement  Anti-infectives:  Cefepime  and Flagyl  IV  Anti-infectives (From admission, onward)    Start     Dose/Rate Route Frequency Ordered Stop   06/03/24 1130  vancomycin  (VANCOCIN ) IVPB 1000 mg/200 mL premix  Status:  Discontinued        1,000 mg 200 mL/hr over 60 Minutes Intravenous Every 24 hours 06/02/24 1149 06/03/24 0949   06/03/24 0000  ceFEPIme  (MAXIPIME ) 2 g in sodium chloride  0.9 % 100 mL IVPB        2 g 200 mL/hr over 30 Minutes Intravenous Every 12 hours 06/02/24 1149 06/08/24 1553   06/02/24 2200  metroNIDAZOLE  (FLAGYL ) IVPB 500 mg        500 mg 100 mL/hr over 60 Minutes Intravenous Every 12 hours 06/02/24 1113 06/09/24 2159   06/02/24 1100  ceFEPIme  (MAXIPIME ) 2 g in sodium chloride  0.9 % 100 mL IVPB        2 g 200 mL/hr over 30 Minutes Intravenous  Once 06/02/24 1045 06/02/24 1449   06/02/24 1100  vancomycin  (VANCOCIN ) IVPB 1000 mg/200 mL premix        1,000 mg 200 mL/hr over 60 Minutes Intravenous  Once 06/02/24 1045 06/02/24 1233   06/02/24 1100  metroNIDAZOLE  (FLAGYL ) IVPB 500 mg        500 mg 100 mL/hr over 60 Minutes Intravenous  Once 06/02/24 1045 06/02/24 1818       Subjective: Today, patient was seen and examined at bedside.  Mildly interactive.  Family at bedside.  Plan for surgical intervention as per family.  Patient denies any nausea vomiting fever chills.  CT scan showed significant inflammation of the small bowel  Objective: Vitals:   06/09/24 0625 06/09/24 1126  BP: (!) 150/96 (!) 132/103  Pulse: 91 92  Resp: 16 16  Temp: 99.3 F (37.4 C) 99 F (37.2 C)  SpO2: 95% 96%    Intake/Output Summary  (Last 24 hours) at 06/09/2024 1256 Last data filed at 06/09/2024 0850 Gross per 24 hour  Intake 2400.58 ml  Output 1800 ml  Net 600.58 ml   Filed Weights   06/02/24 0812  Weight: 61.3 kg   Body mass index is 24.72 kg/m.   Physical Exam:  General:  Average built, not in obvious distress, , appears to be deconditioned and weak, slow speech, HENT:   No scleral pallor or icterus noted. Oral mucosa is moist.  NG tube in place. Chest:  Clear breath sounds. No crackles or wheezes.  CVS: S1 &S2 heard. No murmur.  Regular rate and rhythm. Abdomen: Soft, slightly distended tympanic abdomen moderate tenderness on palpation.  Bowel sounds are heard.   Extremities: No cyanosis, clubbing or edema.  Peripheral pulses are palpable. Psych: Alert, awake with cognitive dysfunction. CNS:  No cranial nerve deficits.  Moving all extremities. Skin: Warm and dry.  No rashes noted.   Data Review: I have personally reviewed the following laboratory data and studies,  CBC: Recent Labs  Lab 06/03/24 0557 06/04/24 0544 06/05/24 0736 06/06/24 0611 06/07/24 0633 06/08/24 0531 06/09/24 0208  WBC 13.9*   < >  4.2 8.8 7.8 12.1* 12.1*  NEUTROABS 11.9*  --   --   --   --   --   --   HGB 12.6   < > 10.7* 12.6 11.8* 12.4 11.9*  HCT 40.7   < > 35.2* 39.6 38.1 39.4 37.4  MCV 93.3   < > 93.9 93.4 91.4 91.0 90.1  PLT 219   < > 252 307 309 324 318   < > = values in this interval not displayed.   Basic Metabolic Panel: Recent Labs  Lab 06/05/24 0736 06/06/24 0611 06/07/24 0633 06/08/24 0531 06/09/24 0208  NA 135 139 139 138 144  K 3.4* 3.1* 3.1* 3.3* 3.5  CL 106 104 106 105 105  CO2 22 25 27 27 27   GLUCOSE 297* 107* 99 108* 113*  BUN 11 11 11  6* <5*  CREATININE 0.45 0.53 0.61 0.50 0.40*  CALCIUM  7.9* 7.7* 7.7* 7.7* 8.1*  MG 1.8 1.8 1.8 2.0 2.0  PHOS  --   --   --  1.9* 2.3*   Liver Function Tests: Recent Labs  Lab 06/03/24 0557 06/08/24 0531 06/09/24 0208  AST 17 30 24   ALT 16 21 17    ALKPHOS 77 64 74  BILITOT 1.1 0.3 0.3  PROT 5.8* 5.0* 5.0*  ALBUMIN  3.1* 2.5* 2.9*   No results for input(s): LIPASE, AMYLASE in the last 168 hours.  No results for input(s): AMMONIA in the last 168 hours. Cardiac Enzymes: No results for input(s): CKTOTAL, CKMB, CKMBINDEX, TROPONINI in the last 168 hours. BNP (last 3 results) No results for input(s): BNP in the last 8760 hours.  ProBNP (last 3 results) No results for input(s): PROBNP in the last 8760 hours.  CBG: Recent Labs  Lab 06/08/24 1722 06/09/24 0021 06/09/24 0627 06/09/24 1128  GLUCAP 89 125* 153* 127*   Recent Results (from the past 240 hours)  Blood Culture (routine x 2)     Status: Abnormal   Collection Time: 06/01/24  2:03 AM   Specimen: BLOOD  Result Value Ref Range Status   Specimen Description   Final    BLOOD RIGHT ANTECUBITAL Performed at [facility], [address], [place], [place] [address]    Special Requests   Final    BOTTLES DRAWN AEROBIC AND ANAEROBIC Blood Culture results may not be optimal due to an inadequate volume of blood received in culture bottles Performed at [facility], [address], [place], [place] [address]    Culture  Setup Time   Final    GRAM POSITIVE COCCI IN CLUSTERS AEROBIC BOTTLE ONLY CRITICAL RESULT CALLED TO, READ BACK BY AND VERIFIED WITH: RN MARVAL W [id number] FCP    Culture (A)  Final    STAPHYLOCOCCUS HOMINIS THE SIGNIFICANCE OF ISOLATING THIS ORGANISM FROM A SINGLE SET OF BLOOD CULTURES WHEN MULTIPLE SETS ARE DRAWN IS UNCERTAIN. PLEASE NOTIFY THE MICROBIOLOGY DEPARTMENT WITHIN ONE WEEK IF SPECIATION AND SENSITIVITIES ARE REQUIRED. Performed at [facility], [address], [place], [place] [address]    Report Status 06/03/2024 FINAL  Final  Blood Culture ID Panel (Reflexed)     Status: Abnormal   Collection Time: 06/01/24  2:03 AM  Result Value Ref Range Status   Enterococcus faecalis NOT  DETECTED NOT DETECTED Final   Enterococcus Faecium NOT DETECTED NOT DETECTED Final   Listeria monocytogenes NOT DETECTED NOT DETECTED Final   Staphylococcus species DETECTED (A) NOT DETECTED Final    Comment: CRITICAL RESULT  CALLED TO, READ BACK BY AND VERIFIED WITH: RN MARVAL ORN [id number] FCP    Staphylococcus aureus (BCID) NOT DETECTED NOT DETECTED Final   Staphylococcus epidermidis NOT DETECTED NOT DETECTED Final   Staphylococcus lugdunensis NOT DETECTED NOT DETECTED Final   Streptococcus species NOT DETECTED NOT DETECTED Final   Streptococcus agalactiae NOT DETECTED NOT DETECTED Final   Streptococcus pneumoniae NOT DETECTED NOT DETECTED Final   Streptococcus pyogenes NOT DETECTED NOT DETECTED Final   A.calcoaceticus-baumannii NOT DETECTED NOT DETECTED Final   Bacteroides fragilis NOT DETECTED NOT DETECTED Final   Enterobacterales NOT DETECTED NOT DETECTED Final   Enterobacter cloacae complex NOT DETECTED NOT DETECTED Final   Escherichia coli NOT DETECTED NOT DETECTED Final   Klebsiella aerogenes NOT DETECTED NOT DETECTED Final   Klebsiella oxytoca NOT DETECTED NOT DETECTED Final   Klebsiella pneumoniae NOT DETECTED NOT DETECTED Final   Proteus species NOT DETECTED NOT DETECTED Final   Salmonella species NOT DETECTED NOT DETECTED Final   Serratia marcescens NOT DETECTED NOT DETECTED Final   Haemophilus influenzae NOT DETECTED NOT DETECTED Final   Neisseria meningitidis NOT DETECTED NOT DETECTED Final   Pseudomonas aeruginosa NOT DETECTED NOT DETECTED Final   Stenotrophomonas maltophilia NOT DETECTED NOT DETECTED Final   Candida albicans NOT DETECTED NOT DETECTED Final   Candida auris NOT DETECTED NOT DETECTED Final   Candida glabrata NOT DETECTED NOT DETECTED Final   Candida krusei NOT DETECTED NOT DETECTED Final   Candida parapsilosis NOT DETECTED NOT DETECTED Final   Candida tropicalis NOT DETECTED NOT DETECTED Final   Cryptococcus neoformans/gattii NOT DETECTED NOT DETECTED  Final    Comment: Performed at [facility], [address], [place], [place] [address]  Blood Culture (routine x 2)     Status: None   Collection Time: 06/01/24  2:08 AM   Specimen: BLOOD  Result Value Ref Range Status   Specimen Description   Final    BLOOD BLOOD RIGHT HAND Performed at [facility], [address] [place], [place] [address]    Special Requests   Final    BOTTLES DRAWN AEROBIC AND ANAEROBIC Blood Culture results may not be optimal due to an inadequate volume of blood received in culture bottles Performed at [facility], [address], [place], [place] [address]    Culture   Final    NO GROWTH 5 DAYS Performed at [facility], [address], [place], [place] [address]    Report Status 06/06/2024 FINAL  Final  Resp panel by RT-PCR (RSV, Flu A&B, Covid) Anterior Nasal Swab     Status: None   Collection Time: 06/01/24  2:30 AM   Specimen: Anterior Nasal Swab  Result Value Ref Range Status   SARS Coronavirus 2 by RT PCR NEGATIVE NEGATIVE Final    Comment: (NOTE) SARS-CoV-2 target nucleic acids are NOT DETECTED.  The SARS-CoV-2 RNA is generally detectable in upper respiratory specimens during the acute phase of infection. The lowest concentration of SARS-CoV-2 viral copies this assay can detect is 138 copies/mL. A negative result does not preclude SARS-Cov-2 infection and should not be used as the sole basis for treatment or other patient management decisions. A negative result may occur with  improper specimen collection/handling, submission of specimen other than nasopharyngeal swab, presence of viral mutation(s) within the areas targeted by this assay, and inadequate number of viral copies(<138 copies/mL). A negative result must be combined with clinical observations, patient history, and epidemiological information. The  expected result is Negative.  Fact Sheet for Patients:   BloggerCourse.com  Fact Sheet for Healthcare Providers:  SeriousBroker.it  This test is no t yet approved or cleared by the [place]  FDA and  has been authorized for detection and/or diagnosis of SARS-CoV-2 by FDA under an Emergency Use Authorization (EUA). This EUA will remain  in effect (meaning this test can be used) for the duration of the COVID-19 declaration under Section 564(b)(1) of the Act, 21 U.S.C.section 360bbb-3(b)(1), unless the authorization is terminated  or revoked sooner.       Influenza A by PCR NEGATIVE NEGATIVE Final   Influenza B by PCR NEGATIVE NEGATIVE Final    Comment: (NOTE) The Xpert Xpress SARS-CoV-2/FLU/RSV plus assay is intended as an aid in the diagnosis of influenza from Nasopharyngeal swab specimens and should not be used as a sole basis for treatment. Nasal washings and aspirates are unacceptable for Xpert Xpress SARS-CoV-2/FLU/RSV testing.  Fact Sheet for Patients: BloggerCourse.com  Fact Sheet for Healthcare Providers: SeriousBroker.it  This test is not yet approved or cleared by the [place]  FDA and has been authorized for detection and/or diagnosis of SARS-CoV-2 by FDA under an Emergency Use Authorization (EUA). This EUA will remain in effect (meaning this test can be used) for the duration of the COVID-19 declaration under Section 564(b)(1) of the Act, 21 U.S.C. section 360bbb-3(b)(1), unless the authorization is terminated or revoked.     Resp Syncytial Virus by PCR NEGATIVE NEGATIVE Final    Comment: (NOTE) Fact Sheet for Patients: BloggerCourse.com  Fact Sheet for Healthcare Providers: SeriousBroker.it  This test is not yet approved or cleared by the [place]  FDA and has been authorized for detection and/or diagnosis of SARS-CoV-2 by FDA under an Emergency Use  Authorization (EUA). This EUA will remain in effect (meaning this test can be used) for the duration of the COVID-19 declaration under Section 564(b)(1) of the Act, 21 U.S.C. section 360bbb-3(b)(1), unless the authorization is terminated or revoked.  Performed at [facility], [address] [place], [place] [address]   Blood Culture (routine x 2)     Status: None   Collection Time: 06/02/24  8:35 AM   Specimen: BLOOD  Result Value Ref Range Status   Specimen Description   Final    BLOOD RIGHT ANTECUBITAL Performed at [facility], [address] [place], [place] [address]    Special Requests   Final    BOTTLES DRAWN AEROBIC AND ANAEROBIC Blood Culture adequate volume Performed at [facility], [address] [place], [place] [address]    Culture   Final    NO GROWTH 5 DAYS Performed at [facility], [address], [place], [place] [address]    Report Status 06/07/2024 FINAL  Final  Resp panel by RT-PCR (RSV, Flu A&B, Covid) Anterior Nasal Swab     Status: None   Collection Time: 06/02/24 10:14 AM   Specimen: Anterior Nasal Swab  Result Value Ref Range Status   SARS Coronavirus 2 by RT PCR NEGATIVE NEGATIVE Final    Comment: (NOTE) SARS-CoV-2 target nucleic acids are NOT DETECTED.  The SARS-CoV-2 RNA is generally detectable in upper respiratory specimens during the acute phase of infection. The lowest concentration of SARS-CoV-2 viral copies this assay can detect is 138 copies/mL. A negative result does not preclude SARS-Cov-2 infection and should not be used as the sole basis for treatment or other patient management decisions. A negative result may occur  with  improper specimen collection/handling, submission of specimen other than nasopharyngeal swab, presence of viral mutation(s) within the areas targeted by this assay, and inadequate number of viral copies(<138 copies/mL). A negative result must be  combined with clinical observations, patient history, and epidemiological information. The expected result is Negative.  Fact Sheet for Patients:  BloggerCourse.com  Fact Sheet for Healthcare Providers:  SeriousBroker.it  This test is no t yet approved or cleared by the [place]  FDA and  has been authorized for detection and/or diagnosis of SARS-CoV-2 by FDA under an Emergency Use Authorization (EUA). This EUA will remain  in effect (meaning this test can be used) for the duration of the COVID-19 declaration under Section 564(b)(1) of the Act, 21 U.S.C.section 360bbb-3(b)(1), unless the authorization is terminated  or revoked sooner.       Influenza A by PCR NEGATIVE NEGATIVE Final   Influenza B by PCR NEGATIVE NEGATIVE Final    Comment: (NOTE) The Xpert Xpress SARS-CoV-2/FLU/RSV plus assay is intended as an aid in the diagnosis of influenza from Nasopharyngeal swab specimens and should not be used as a sole basis for treatment. Nasal washings and aspirates are unacceptable for Xpert Xpress SARS-CoV-2/FLU/RSV testing.  Fact Sheet for Patients: BloggerCourse.com  Fact Sheet for Healthcare Providers: SeriousBroker.it  This test is not yet approved or cleared by the [place]  FDA and has been authorized for detection and/or diagnosis of SARS-CoV-2 by FDA under an Emergency Use Authorization (EUA). This EUA will remain in effect (meaning this test can be used) for the duration of the COVID-19 declaration under Section 564(b)(1) of the Act, 21 U.S.C. section 360bbb-3(b)(1), unless the authorization is terminated or revoked.     Resp Syncytial Virus by PCR NEGATIVE NEGATIVE Final    Comment: (NOTE) Fact Sheet for Patients: BloggerCourse.com  Fact Sheet for Healthcare Providers: SeriousBroker.it  This test is not yet  approved or cleared by the [place]  FDA and has been authorized for detection and/or diagnosis of SARS-CoV-2 by FDA under an Emergency Use Authorization (EUA). This EUA will remain in effect (meaning this test can be used) for the duration of the COVID-19 declaration under Section 564(b)(1) of the Act, 21 U.S.C. section 360bbb-3(b)(1), unless the authorization is terminated or revoked.  Performed at [facility], [address], [place], [place] [address]   Blood Culture (routine x 2)     Status: None   Collection Time: 06/02/24 12:45 PM   Specimen: BLOOD  Result Value Ref Range Status   Specimen Description   Final    BLOOD BLOOD LEFT ARM Performed at [facility], [address], [place], [place] [address]    Special Requests   Final    BOTTLES DRAWN AEROBIC ONLY Blood Culture adequate volume Performed at [facility], [address], [place], [place] [address]    Culture   Final    NO GROWTH 5 DAYS Performed at [facility], [address] [place], [place] [address]    Report Status 06/07/2024 FINAL  Final     Studies: CT ABDOMEN PELVIS W CONTRAST Result Date: 06/08/2024 CLINICAL DATA:  Abdominal pain and distension. Dilated small bowel on KUB 06/08/2024 raising concern for small bowel obstruction. EXAM: CT ABDOMEN AND PELVIS WITH CONTRAST TECHNIQUE: Multidetector CT imaging of the abdomen and pelvis was performed using the standard protocol following bolus administration of intravenous contrast. RADIATION DOSE REDUCTION: This exam was performed according to the departmental dose-optimization program which includes automated  exposure control, adjustment of the mA and/or kV according to patient size and/or use of iterative reconstruction technique. CONTRAST:  OMNIPAQUE  IOHEXOL  300 MG/ML  SOLN COMPARISON:  06/01/2024 abdominal radiograph 06/08/2024 FINDINGS: Lower chest: New small bilateral pleural effusions and  associated passive atelectasis. New mild cardiomegaly. A nasogastric tube extends in the distal esophagus and into the stomach body. Hepatobiliary: Small subcapsular cyst in segment 8 on image 12 series 12, similar to previous. Contracted gallbladder. Pancreas: Unremarkable Spleen: Unremarkable Adrenals/Urinary Tract: Stable fullness of the adrenal glands, especially along the left side, without a discrete mass. Bosniak category 1 cyst of the left kidney lower pole. Urinary bladder unremarkable. Stomach/Bowel: Nasogastric tube terminates in the stomach body. Sigmoid colon diverticulosis. Contrast medium in normal appendix. Complex appearance of severely thickened loops of central pelvic small bowel with indistinct bowel wall margins, surrounding edema, dilated small bowel extending towards these loops, and at least some sense of twisting of bowel for example in the central abdomen on images 52-56 of series 2 along adjacent branches of the superior mesenteric vein concerning for mesenteric volvulus or closed loop obstruction. No definite pneumatosis or extraluminal gas. The indistinctness of bowel walls makes this region difficult to assess, and the possibility of intussusception is also not completely excluded. There is some small loculations of gas for example near the left inguinal ring potentially along the thickened bowel which are presumably intraluminal but could be contained tiny amounts of extraluminal gas. Vascular/Lymphatic: Patent celiac trunk and SMA. Aortoiliac atherosclerosis. Reproductive: Unremarkable fluid density lesion along the right adnexa 4.5 by 3.1 cm with single internal septation, this could be due to hydrosalpinx or ovarian cysts. This has been present and not hypermetabolic on prior PET-CT. Other: Mild ascites.  Presacral edema.  Mild mesenteric edema. Musculoskeletal: Dextroconvex thoracolumbar scoliosis with substantial rotary component. Healed left posterior rib fractures. Mild  deformity of the left pubic bone potentially from old fracture. Bone island in the left sacrum. IMPRESSION: 1. Severely thickened and inflamed central pelvic loops of small bowel concerning for ischemia. Complex appearance due to indistinct bowel wall margins, surrounding edema, dilated small bowel extending towards these loops, and at least some sense of twisting of bowel along adjacent branches of the superior mesenteric vein concerning for mesenteric volvulus or closed loop obstruction. No definite pneumatosis. Small locules of gas along the left inguinal ring and along the inflamed loops of bowel are presumably intraluminal but could conceivably represent contained extraluminal gas from local microperforation. 2. New small bilateral pleural effusions and associated passive atelectasis. 3. New mild cardiomegaly. 4. Mild ascites. 5. Sigmoid colon diverticulosis. 6. Stable fluid density lesion along the right adnexa with single internal septation, this could be due to hydrosalpinx or ovarian cysts. This has been present and not hypermetabolic on prior PET-CT. 7. 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 8.  Aortic Atherosclerosis (ICD10-I70.0). Electronically Signed   By: Ryan Salvage M.D.   On: 06/08/2024 17:45   US  EKG SITE RITE Result Date: 06/08/2024 If [facility] image not attached, placement could not be confirmed due to current cardiac rhythm.  DG Abd Portable 1V Result Date: 06/08/2024 EXAM: 1 VIEW XRAY OF THE ABDOMEN 06/08/2024 05:23:00 AM COMPARISON: 06/07/2024 CLINICAL HISTORY: [id number] SBO (small bowel obstruction) (HCC) [id number]. Small bowel obstruction FINDINGS: BOWEL: Gas distended mid abdominal small bowel loops slightly increased in degree of distention since prior study. There is mild gaseous distention of the proximal colon. SOFT TISSUES: No opaque urinary calculi. BONES: Lumbar Dextroscoliosis with multilevel spondylitic  change. No acute osseous abnormality.  IMPRESSION: 1. Small bowel obstruction with gas distended mid abdominal small bowel loops, slightly increased in degree of distention since prior study. 2.   mild gaseous distention of the proximal colon. Electronically signed by: Katheleen Faes MD 06/08/2024 09:26 AM EDT RP Workstation: [id number]   DG Abd Portable 1V Result Date: 06/07/2024 CLINICAL DATA:  Follow-up ileus 24 hour film EXAM: PORTABLE ABDOMEN - 1 VIEW COMPARISON:  Film from earlier in the same day. FINDINGS: Administered contrast lies within multiple dilated loops of small bowel. Some contrast has reached the colon although the majority lies within the small bowel. No free air is noted. Gastric catheter is noted in place. IMPRESSION: Persistent small bowel dilatation with contrast within. Some colonic contrast is noted. Electronically Signed   By: Oneil Devonshire M.D.   On: 06/07/2024 20:02      Vernal Alstrom, MD  [facility] 06/09/2024  If 7PM-7AM, please contact night-coverage

## 2024-06-09 NOTE — Plan of Care (Signed)
   Problem: Fluid Volume: Goal: Hemodynamic stability will improve Outcome: Progressing

## 2024-06-09 NOTE — Plan of Care (Signed)
  Problem: Clinical Measurements: Goal: Diagnostic test results will improve Outcome: Progressing   Problem: Respiratory: Goal: Ability to maintain adequate ventilation will improve Outcome: Progressing   Problem: Clinical Measurements: Goal: Ability to maintain clinical measurements within normal limits will improve Outcome: Progressing   Problem: Activity: Goal: Risk for activity intolerance will decrease Outcome: Progressing

## 2024-06-09 NOTE — Progress Notes (Addendum)
 Subjective/Chief Complaint: Feels ok but still has some abd pain. CT showed significant inflammation of small bowel   Objective: Vital signs in last 24 hours: Temp:  [97.7 F (36.5 C)-99.3 F (37.4 C)] 99.3 F (37.4 C) (08/27 0625) Pulse Rate:  [88-91] 91 (08/27 0625) Resp:  [15-16] 16 (08/27 0625) BP: (143-150)/(95-104) 150/96 (08/27 0625) SpO2:  [95 %-97 %] 95 % (08/27 0625) Last BM Date : 06/09/24  Intake/Output from previous day: 08/26 0701 - 08/27 0700 In: 3300.6 [I.V.:1175; NG/GT:1000; IV Piggyback:1125.6] Out: 1950 [Urine:800; Emesis/NG output:1150] Intake/Output this shift: No intake/output data recorded.  General appearance: alert and cooperative Resp: clear to auscultation bilaterally Cardio: regular rate and rhythm GI: soft, moderate focal lower tenderness. No peritonitis  Lab Results:  Recent Labs    06/08/24 0531 06/09/24 0208  WBC 12.1* 12.1*  HGB 12.4 11.9*  HCT 39.4 37.4  PLT 324 318   BMET Recent Labs    06/08/24 0531 06/09/24 0208  NA 138 144  K 3.3* 3.5  CL 105 105  CO2 27 27  GLUCOSE 108* 113*  BUN 6* <5*  CREATININE 0.50 0.40*  CALCIUM  7.7* 8.1*   PT/INR No results for input(s): LABPROT, INR in the last 72 hours. ABG No results for input(s): PHART, HCO3 in the last 72 hours.  Invalid input(s): PCO2, PO2  Studies/Results: CT ABDOMEN PELVIS W CONTRAST Result Date: 06/08/2024 CLINICAL DATA:  Abdominal pain and distension. Dilated small bowel on KUB 06/08/2024 raising concern for small bowel obstruction. EXAM: CT ABDOMEN AND PELVIS WITH CONTRAST TECHNIQUE: Multidetector CT imaging of the abdomen and pelvis was performed using the standard protocol following bolus administration of intravenous contrast. RADIATION DOSE REDUCTION: This exam was performed according to the departmental dose-optimization program which includes automated exposure control, adjustment of the mA and/or kV according to patient size and/or use of  iterative reconstruction technique. CONTRAST:  OMNIPAQUE  IOHEXOL  300 MG/ML  SOLN COMPARISON:  06/01/2024 abdominal radiograph 06/08/2024 FINDINGS: Lower chest: New small bilateral pleural effusions and associated passive atelectasis. New mild cardiomegaly. A nasogastric tube extends in the distal esophagus and into the stomach body. Hepatobiliary: Small subcapsular cyst in segment 8 on image 12 series 12, similar to previous. Contracted gallbladder. Pancreas: Unremarkable Spleen: Unremarkable Adrenals/Urinary Tract: Stable fullness of the adrenal glands, especially along the left side, without a discrete mass. Bosniak category 1 cyst of the left kidney lower pole. Urinary bladder unremarkable. Stomach/Bowel: Nasogastric tube terminates in the stomach body. Sigmoid colon diverticulosis. Contrast medium in normal appendix. Complex appearance of severely thickened loops of central pelvic small bowel with indistinct bowel wall margins, surrounding edema, dilated small bowel extending towards these loops, and at least some sense of twisting of bowel for example in the central abdomen on images 52-56 of series 2 along adjacent branches of the superior mesenteric vein concerning for mesenteric volvulus or closed loop obstruction. No definite pneumatosis or extraluminal gas. The indistinctness of bowel walls makes this region difficult to assess, and the possibility of intussusception is also not completely excluded. There is some small loculations of gas for example near the left inguinal ring potentially along the thickened bowel which are presumably intraluminal but could be contained tiny amounts of extraluminal gas. Vascular/Lymphatic: Patent celiac trunk and SMA. Aortoiliac atherosclerosis. Reproductive: Unremarkable fluid density lesion along the right adnexa 4.5 by 3.1 cm with single internal septation, this could be due to hydrosalpinx or ovarian cysts. This has been present and not hypermetabolic on prior  PET-CT. Other: Mild  ascites.  Presacral edema.  Mild mesenteric edema. Musculoskeletal: Dextroconvex thoracolumbar scoliosis with substantial rotary component. Healed left posterior rib fractures. Mild deformity of the left pubic bone potentially from old fracture. Bone island in the left sacrum. IMPRESSION: 1. Severely thickened and inflamed central pelvic loops of small bowel concerning for ischemia. Complex appearance due to indistinct bowel wall margins, surrounding edema, dilated small bowel extending towards these loops, and at least some sense of twisting of bowel along adjacent branches of the superior mesenteric vein concerning for mesenteric volvulus or closed loop obstruction. No definite pneumatosis. Small locules of gas along the left inguinal ring and along the inflamed loops of bowel are presumably intraluminal but could conceivably represent contained extraluminal gas from local microperforation. 2. New small bilateral pleural effusions and associated passive atelectasis. 3. New mild cardiomegaly. 4. Mild ascites. 5. Sigmoid colon diverticulosis. 6. Stable fluid density lesion along the right adnexa with single internal septation, this could be due to hydrosalpinx or ovarian cysts. This has been present and not hypermetabolic on prior PET-CT. 7. 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 8.  Aortic Atherosclerosis (ICD10-I70.0). Electronically Signed   By: Ryan Salvage M.D.   On: 06/08/2024 17:45   US  EKG SITE RITE Result Date: 06/08/2024 If [facility] image not attached, placement could not be confirmed due to current cardiac rhythm.  DG Abd Portable 1V Result Date: 06/08/2024 EXAM: 1 VIEW XRAY OF THE ABDOMEN 06/08/2024 05:23:00 AM COMPARISON: 06/07/2024 CLINICAL HISTORY: [id number] SBO (small bowel obstruction) (HCC) [id number]. Small bowel obstruction FINDINGS: BOWEL: Gas distended mid abdominal small bowel loops slightly increased in degree of distention since  prior study. There is mild gaseous distention of the proximal colon. SOFT TISSUES: No opaque urinary calculi. BONES: Lumbar Dextroscoliosis with multilevel spondylitic change. No acute osseous abnormality. IMPRESSION: 1. Small bowel obstruction with gas distended mid abdominal small bowel loops, slightly increased in degree of distention since prior study. 2.   mild gaseous distention of the proximal colon. Electronically signed by: Katheleen Faes MD 06/08/2024 09:26 AM EDT RP Workstation: [id number]   DG Abd Portable 1V Result Date: 06/07/2024 CLINICAL DATA:  Follow-up ileus 24 hour film EXAM: PORTABLE ABDOMEN - 1 VIEW COMPARISON:  Film from earlier in the same day. FINDINGS: Administered contrast lies within multiple dilated loops of small bowel. Some contrast has reached the colon although the majority lies within the small bowel. No free air is noted. Gastric catheter is noted in place. IMPRESSION: Persistent small bowel dilatation with contrast within. Some colonic contrast is noted. Electronically Signed   By: Oneil Devonshire M.D.   On: 06/07/2024 20:02    Anti-infectives: Anti-infectives (From admission, onward)    Start     Dose/Rate Route Frequency Ordered Stop   06/03/24 1130  vancomycin  (VANCOCIN ) IVPB 1000 mg/200 mL premix  Status:  Discontinued        1,000 mg 200 mL/hr over 60 Minutes Intravenous Every 24 hours 06/02/24 1149 06/03/24 0949   06/03/24 0000  ceFEPIme  (MAXIPIME ) 2 g in sodium chloride  0.9 % 100 mL IVPB        2 g 200 mL/hr over 30 Minutes Intravenous Every 12 hours 06/02/24 1149 06/08/24 1553   06/02/24 2200  metroNIDAZOLE  (FLAGYL ) IVPB 500 mg        500 mg 100 mL/hr over 60 Minutes Intravenous Every 12 hours 06/02/24 1113 06/09/24 2159   06/02/24 1100  ceFEPIme  (MAXIPIME ) 2 g in sodium chloride  0.9 % 100 mL IVPB  2 g 200 mL/hr over 30 Minutes Intravenous  Once 06/02/24 1045 06/02/24 1449   06/02/24 1100  vancomycin  (VANCOCIN ) IVPB 1000 mg/200 mL premix         1,000 mg 200 mL/hr over 60 Minutes Intravenous  Once 06/02/24 1045 06/02/24 1233   06/02/24 1100  metroNIDAZOLE  (FLAGYL ) IVPB 500 mg        500 mg 100 mL/hr over 60 Minutes Intravenous  Once 06/02/24 1045 06/02/24 1818       Assessment/Plan: s/p Procedure(s) with comments: LAPAROSCOPY, DIAGNOSTIC (N/A) - Possible laparotomy Continue ng and bowel rest Continue IV abx I talked to pt and family about doing diagnostic laparoscopy today. If we find something significant then would plan for exploration and possible bowel resection.  I have discussed with the patient and family in detail the risks and benefits of the operation as well as some of the technical aspects and I understand and wish to proceed  LOS: 7 days    [name] 06/09/2024

## 2024-06-09 NOTE — Transfer of Care (Signed)
 Immediate Anesthesia Transfer of Care Note  Patient: Lindsay Mason  Procedure(s) Performed: LAPAROSCOPY, DIAGNOSTIC, EXPLORATORY LAPAROTOMY WITH SMALL BOWEL RESECTION  Patient Location: PACU  Anesthesia Type:General  Level of Consciousness: drowsy  Airway & Oxygen Therapy: Patient Spontanous Breathing  Post-op Assessment: Report given to RN  Post vital signs: Reviewed and stable  Last Vitals:  Vitals Value Taken Time  BP 137/91 06/09/24 16:07  Temp    Pulse 82 06/09/24 16:14  Resp 19 06/09/24 16:14  SpO2 100 % 06/09/24 16:14  Vitals shown include unfiled device data.  Last Pain:  Vitals:   06/09/24 0948  TempSrc:   PainSc: 4       Patients Stated Pain Goal: 2 (06/09/24 0948)  Complications: No notable events documented.

## 2024-06-09 NOTE — Interval H&P Note (Signed)
 History and Physical Interval Note:  06/09/2024 1:33 PM  Lindsay Mason  has presented today for surgery, with the diagnosis of Possible small bowel ischemia.  The various methods of treatment have been discussed with the patient and family. After consideration of risks, benefits and other options for treatment, the patient has consented to  Procedure(s) with comments: LAPAROSCOPY, DIAGNOSTIC (N/A) - Possible laparotomy as a surgical intervention.  The patient's history has been reviewed, patient examined, no change in status, stable for surgery.  I have reviewed the patient's chart and labs.  Questions were answered to the patient's satisfaction.     [name]

## 2024-06-09 NOTE — Anesthesia Procedure Notes (Signed)
 Procedure Name: Intubation Date/Time: 06/09/2024 2:12 PM  Performed by: Delores Duwaine SAUNDERS, CRNAPre-anesthesia Checklist: Patient identified, Emergency Drugs available, Suction available and Patient being monitored Patient Re-evaluated:Patient Re-evaluated prior to induction Oxygen Delivery Method: Circle System Utilized Preoxygenation: Pre-oxygenation with 100% oxygen Induction Type: IV induction Ventilation: Mask ventilation without difficulty Laryngoscope Size: Mac and 3 Grade View: Grade I Tube type: Oral Tube size: 7.0 mm Number of attempts: 1 Airway Equipment and Method: Stylet and Oral airway Placement Confirmation: ETT inserted through vocal cords under direct vision, positive ETCO2 and breath sounds checked- equal and bilateral Secured at: 21 cm Tube secured with: Tape Dental Injury: Teeth and Oropharynx as per pre-operative assessment

## 2024-06-09 NOTE — H&P (View-Only) (Signed)
 Subjective/Chief Complaint: Feels ok but still has some abd pain. CT showed significant inflammation of small bowel   Objective: Vital signs in last 24 hours: Temp:  [97.7 F (36.5 C)-99.3 F (37.4 C)] 99.3 F (37.4 C) (08/27 0625) Pulse Rate:  [88-91] 91 (08/27 0625) Resp:  [15-16] 16 (08/27 0625) BP: (143-150)/(95-104) 150/96 (08/27 0625) SpO2:  [95 %-97 %] 95 % (08/27 0625) Last BM Date : 06/09/24  Intake/Output from previous day: 08/26 0701 - 08/27 0700 In: 3300.6 [I.V.:1175; NG/GT:1000; IV Piggyback:1125.6] Out: 1950 [Urine:800; Emesis/NG output:1150] Intake/Output this shift: No intake/output data recorded.  General appearance: alert and cooperative Resp: clear to auscultation bilaterally Cardio: regular rate and rhythm GI: soft, moderate focal lower tenderness. No peritonitis  Lab Results:  Recent Labs    06/08/24 0531 06/09/24 0208  WBC 12.1* 12.1*  HGB 12.4 11.9*  HCT 39.4 37.4  PLT 324 318   BMET Recent Labs    06/08/24 0531 06/09/24 0208  NA 138 144  K 3.3* 3.5  CL 105 105  CO2 27 27  GLUCOSE 108* 113*  BUN 6* <5*  CREATININE 0.50 0.40*  CALCIUM  7.7* 8.1*   PT/INR No results for input(s): LABPROT, INR in the last 72 hours. ABG No results for input(s): PHART, HCO3 in the last 72 hours.  Invalid input(s): PCO2, PO2  Studies/Results: CT ABDOMEN PELVIS W CONTRAST Result Date: 06/08/2024 CLINICAL DATA:  Abdominal pain and distension. Dilated small bowel on KUB 06/08/2024 raising concern for small bowel obstruction. EXAM: CT ABDOMEN AND PELVIS WITH CONTRAST TECHNIQUE: Multidetector CT imaging of the abdomen and pelvis was performed using the standard protocol following bolus administration of intravenous contrast. RADIATION DOSE REDUCTION: This exam was performed according to the departmental dose-optimization program which includes automated exposure control, adjustment of the mA and/or kV according to patient size and/or use of  iterative reconstruction technique. CONTRAST:  OMNIPAQUE  IOHEXOL  300 MG/ML  SOLN COMPARISON:  06/01/2024 abdominal radiograph 06/08/2024 FINDINGS: Lower chest: New small bilateral pleural effusions and associated passive atelectasis. New mild cardiomegaly. A nasogastric tube extends in the distal esophagus and into the stomach body. Hepatobiliary: Small subcapsular cyst in segment 8 on image 12 series 12, similar to previous. Contracted gallbladder. Pancreas: Unremarkable Spleen: Unremarkable Adrenals/Urinary Tract: Stable fullness of the adrenal glands, especially along the left side, without a discrete mass. Bosniak category 1 cyst of the left kidney lower pole. Urinary bladder unremarkable. Stomach/Bowel: Nasogastric tube terminates in the stomach body. Sigmoid colon diverticulosis. Contrast medium in normal appendix. Complex appearance of severely thickened loops of central pelvic small bowel with indistinct bowel wall margins, surrounding edema, dilated small bowel extending towards these loops, and at least some sense of twisting of bowel for example in the central abdomen on images 52-56 of series 2 along adjacent branches of the superior mesenteric vein concerning for mesenteric volvulus or closed loop obstruction. No definite pneumatosis or extraluminal gas. The indistinctness of bowel walls makes this region difficult to assess, and the possibility of intussusception is also not completely excluded. There is some small loculations of gas for example near the left inguinal ring potentially along the thickened bowel which are presumably intraluminal but could be contained tiny amounts of extraluminal gas. Vascular/Lymphatic: Patent celiac trunk and SMA. Aortoiliac atherosclerosis. Reproductive: Unremarkable fluid density lesion along the right adnexa 4.5 by 3.1 cm with single internal septation, this could be due to hydrosalpinx or ovarian cysts. This has been present and not hypermetabolic on prior  PET-CT. Other: Mild  ascites.  Presacral edema.  Mild mesenteric edema. Musculoskeletal: Dextroconvex thoracolumbar scoliosis with substantial rotary component. Healed left posterior rib fractures. Mild deformity of the left pubic bone potentially from old fracture. Bone island in the left sacrum. IMPRESSION: 1. Severely thickened and inflamed central pelvic loops of small bowel concerning for ischemia. Complex appearance due to indistinct bowel wall margins, surrounding edema, dilated small bowel extending towards these loops, and at least some sense of twisting of bowel along adjacent branches of the superior mesenteric vein concerning for mesenteric volvulus or closed loop obstruction. No definite pneumatosis. Small locules of gas along the left inguinal ring and along the inflamed loops of bowel are presumably intraluminal but could conceivably represent contained extraluminal gas from local microperforation. 2. New small bilateral pleural effusions and associated passive atelectasis. 3. New mild cardiomegaly. 4. Mild ascites. 5. Sigmoid colon diverticulosis. 6. Stable fluid density lesion along the right adnexa with single internal septation, this could be due to hydrosalpinx or ovarian cysts. This has been present and not hypermetabolic on prior PET-CT. 7. 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 8.  Aortic Atherosclerosis (ICD10-I70.0). Electronically Signed   By: Ryan Salvage M.D.   On: 06/08/2024 17:45   US  EKG SITE RITE Result Date: 06/08/2024 If [facility] image not attached, placement could not be confirmed due to current cardiac rhythm.  DG Abd Portable 1V Result Date: 06/08/2024 EXAM: 1 VIEW XRAY OF THE ABDOMEN 06/08/2024 05:23:00 AM COMPARISON: 06/07/2024 CLINICAL HISTORY: [id number] SBO (small bowel obstruction) (HCC) [id number]. Small bowel obstruction FINDINGS: BOWEL: Gas distended mid abdominal small bowel loops slightly increased in degree of distention since  prior study. There is mild gaseous distention of the proximal colon. SOFT TISSUES: No opaque urinary calculi. BONES: Lumbar Dextroscoliosis with multilevel spondylitic change. No acute osseous abnormality. IMPRESSION: 1. Small bowel obstruction with gas distended mid abdominal small bowel loops, slightly increased in degree of distention since prior study. 2.   mild gaseous distention of the proximal colon. Electronically signed by: Katheleen Faes MD 06/08/2024 09:26 AM EDT RP Workstation: [id number]   DG Abd Portable 1V Result Date: 06/07/2024 CLINICAL DATA:  Follow-up ileus 24 hour film EXAM: PORTABLE ABDOMEN - 1 VIEW COMPARISON:  Film from earlier in the same day. FINDINGS: Administered contrast lies within multiple dilated loops of small bowel. Some contrast has reached the colon although the majority lies within the small bowel. No free air is noted. Gastric catheter is noted in place. IMPRESSION: Persistent small bowel dilatation with contrast within. Some colonic contrast is noted. Electronically Signed   By: Oneil Devonshire M.D.   On: 06/07/2024 20:02    Anti-infectives: Anti-infectives (From admission, onward)    Start     Dose/Rate Route Frequency Ordered Stop   06/03/24 1130  vancomycin  (VANCOCIN ) IVPB 1000 mg/200 mL premix  Status:  Discontinued        1,000 mg 200 mL/hr over 60 Minutes Intravenous Every 24 hours 06/02/24 1149 06/03/24 0949   06/03/24 0000  ceFEPIme  (MAXIPIME ) 2 g in sodium chloride  0.9 % 100 mL IVPB        2 g 200 mL/hr over 30 Minutes Intravenous Every 12 hours 06/02/24 1149 06/08/24 1553   06/02/24 2200  metroNIDAZOLE  (FLAGYL ) IVPB 500 mg        500 mg 100 mL/hr over 60 Minutes Intravenous Every 12 hours 06/02/24 1113 06/09/24 2159   06/02/24 1100  ceFEPIme  (MAXIPIME ) 2 g in sodium chloride  0.9 % 100 mL IVPB  2 g 200 mL/hr over 30 Minutes Intravenous  Once 06/02/24 1045 06/02/24 1449   06/02/24 1100  vancomycin  (VANCOCIN ) IVPB 1000 mg/200 mL premix         1,000 mg 200 mL/hr over 60 Minutes Intravenous  Once 06/02/24 1045 06/02/24 1233   06/02/24 1100  metroNIDAZOLE  (FLAGYL ) IVPB 500 mg        500 mg 100 mL/hr over 60 Minutes Intravenous  Once 06/02/24 1045 06/02/24 1818       Assessment/Plan: s/p Procedure(s) with comments: LAPAROSCOPY, DIAGNOSTIC (N/A) - Possible laparotomy Continue ng and bowel rest Continue IV abx I talked to pt and family about doing diagnostic laparoscopy today. If we find something significant then would plan for exploration and possible bowel resection.  I have discussed with the patient and family in detail the risks and benefits of the operation as well as some of the technical aspects and I understand and wish to proceed  LOS: 7 days    Lindsay Mason 06/09/2024

## 2024-06-09 NOTE — Progress Notes (Signed)
 PHARMACY NOTE:  ANTIMICROBIAL RENAL DOSAGE ADJUSTMENT  Current antimicrobial regimen includes a mismatch between antimicrobial dosage and estimated renal function.  As per policy approved by the Pharmacy & Therapeutics and Medical Executive Committees, the antimicrobial dosage will be adjusted accordingly.  Current antimicrobial dosage:  Cefepime  1g IV q12h  Indication: sepsis  Renal Function:  Estimated Creatinine Clearance: 54 mL/min (A) (by C-G formula based on SCr of 0.4 mg/dL (L)).     Antimicrobial dosage has been changed to:  Cefepime  2g IV q12h   Thank you for allowing pharmacy to be a part of this patient's care.  Kemp Arvin Fletcher, [facility] 06/09/2024 5:32 PM

## 2024-06-09 NOTE — Progress Notes (Signed)
 PT Cancellation Note  Patient Details Name: Lindsay Mason MRN: [id number] DOB: [birth year]   Cancelled Treatment:    Reason Eval/Treat Not Completed: Patient at procedure or test/unavailable--plan for surgery on today. will hold PT today and plan to re-evaluate after surgery. thanks.    Dannial SQUIBB, PT Acute Rehabilitation  Office: [phone]

## 2024-06-09 NOTE — Anesthesia Preprocedure Evaluation (Addendum)
 Anesthesia Evaluation  Patient identified by MRN, date of birth, ID band Patient awake    Reviewed: Allergy & Precautions, NPO status , Patient's Chart, lab work & pertinent test results  History of Anesthesia Complications Negative for: history of anesthetic complications  Airway Mallampati: II  TM Distance: >3 FB Neck ROM: Full    Dental no notable dental hx. 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 Left ventricular ejection fraction, by estimation, is 65 to 70%. The  left ventricle has normal function. The left ventricle has no regional  wall motion abnormalities. There is mild concentric left ventricular  hypertrophy. Left ventricular diastolic  parameters are consistent with Grade I diastolic dysfunction (impaired  relaxation).   2. Right ventricular systolic function is normal. The right ventricular  size is normal.   3. The mitral valve is normal in structure. No evidence of mitral valve  regurgitation. No evidence of mitral stenosis.   4. The aortic valve is tricuspid. There is mild calcification of the  aortic valve. Aortic valve regurgitation is not visualized. Aortic valve  sclerosis/calcification is present, without any evidence of aortic  stenosis.   5. The inferior vena cava is normal in size with greater than 50%  respiratory variability, suggesting right atrial pressure of 3 mmHg.      Neuro/Psych  PSYCHIATRIC DISORDERS     Dementia    GI/Hepatic ,GERD  ,,  Endo/Other    Renal/GU      Musculoskeletal   Abdominal   Peds  Hematology   Anesthesia Other Findings All: PCN, Donazepil  Reproductive/Obstetrics                               Anesthesia Physical Anesthesia Plan  ASA: 3 and emergent  Anesthesia Plan: General   Post-op Pain Management: Ofirmev  IV (intra-op)*   Induction: Intravenous  PONV Risk Score and Plan: 4 or greater and Treatment may vary due to age or medical condition, Ondansetron , Dexamethasone , Midazolam , Propofol  infusion and TIVA  Airway Management Planned: Oral ETT  Additional Equipment: None  Intra-op Plan:   Post-operative Plan: Extubation in OR  Informed Consent: I have reviewed the patients History and Physical, chart, labs and discussed the procedure including the risks, benefits and alternatives for the proposed anesthesia with the patient or authorized representative who has indicated his/her understanding and acceptance.     Dental advisory given  Plan Discussed with: CRNA and Surgeon  Anesthesia Plan Comments:          Anesthesia Quick Evaluation

## 2024-06-10 ENCOUNTER — Encounter (HOSPITAL_COMMUNITY): Payer: Self-pay | Admitting: General Surgery

## 2024-06-10 DIAGNOSIS — R6510 Systemic inflammatory response syndrome (SIRS) of non-infectious origin without acute organ dysfunction: Secondary | ICD-10-CM | POA: Diagnosis not present

## 2024-06-10 DIAGNOSIS — I739 Peripheral vascular disease, unspecified: Secondary | ICD-10-CM | POA: Diagnosis not present

## 2024-06-10 DIAGNOSIS — J449 Chronic obstructive pulmonary disease, unspecified: Secondary | ICD-10-CM | POA: Diagnosis not present

## 2024-06-10 DIAGNOSIS — E785 Hyperlipidemia, unspecified: Secondary | ICD-10-CM | POA: Diagnosis not present

## 2024-06-10 LAB — GLUCOSE, CAPILLARY
Glucose-Capillary: 126 mg/dL — ABNORMAL HIGH (ref 70–99)
Glucose-Capillary: 134 mg/dL — ABNORMAL HIGH (ref 70–99)
Glucose-Capillary: 140 mg/dL — ABNORMAL HIGH (ref 70–99)
Glucose-Capillary: 159 mg/dL — ABNORMAL HIGH (ref 70–99)

## 2024-06-10 LAB — COMPREHENSIVE METABOLIC PANEL WITH GFR
ALT: 15 U/L (ref 0–44)
AST: 22 U/L (ref 15–41)
Albumin: 3.3 g/dL — ABNORMAL LOW (ref 3.5–5.0)
Alkaline Phosphatase: 59 U/L (ref 38–126)
Anion gap: 13 (ref 5–15)
BUN: 9 mg/dL (ref 8–23)
CO2: 24 mmol/L (ref 22–32)
Calcium: 8.1 mg/dL — ABNORMAL LOW (ref 8.9–10.3)
Chloride: 103 mmol/L (ref 98–111)
Creatinine, Ser: 0.43 mg/dL — ABNORMAL LOW (ref 0.44–1.00)
GFR, Estimated: >60 mL/min (ref >60)
Glucose, Bld: 181 mg/dL — ABNORMAL HIGH (ref 70–99)
Potassium: 4.1 mmol/L (ref 3.5–5.1)
Sodium: 140 mmol/L (ref 135–145)
Total Bilirubin: 0.3 mg/dL (ref 0.0–1.2)
Total Protein: 5 g/dL — ABNORMAL LOW (ref 6.5–8.1)

## 2024-06-10 LAB — MAGNESIUM: Magnesium: 1.9 mg/dL (ref 1.7–2.4)

## 2024-06-10 LAB — PHOSPHORUS: Phosphorus: 2.3 mg/dL — ABNORMAL LOW (ref 2.5–4.6)

## 2024-06-10 MED ORDER — POTASSIUM PHOSPHATES 15 MMOLE/5ML IV SOLN
15.0000 mmol | Freq: Once | INTRAVENOUS | Status: AC
  Administered 2024-06-10: 15 mmol via INTRAVENOUS
  Filled 2024-06-10: qty 5

## 2024-06-10 MED ORDER — METHOCARBAMOL 1000 MG/10ML IJ SOLN
500.0000 mg | Freq: Three times a day (TID) | INTRAMUSCULAR | Status: DC
  Administered 2024-06-10 – 2024-07-08 (×81): 500 mg via INTRAVENOUS
  Filled 2024-06-10 (×81): qty 10

## 2024-06-10 MED ORDER — TRAVASOL 10 % IV SOLN
INTRAVENOUS | Status: AC
  Filled 2024-06-10: qty 864

## 2024-06-10 MED ORDER — HALOPERIDOL LACTATE 5 MG/ML IJ SOLN
1.0000 mg | Freq: Four times a day (QID) | INTRAMUSCULAR | Status: DC | PRN
  Administered 2024-06-10 – 2024-06-25 (×3): 1 mg via INTRAVENOUS
  Filled 2024-06-10 (×3): qty 1

## 2024-06-10 MED ORDER — ACETAMINOPHEN 10 MG/ML IV SOLN
1000.0000 mg | Freq: Four times a day (QID) | INTRAVENOUS | Status: DC
  Administered 2024-06-10: 1000 mg via INTRAVENOUS
  Filled 2024-06-10: qty 100

## 2024-06-10 MED ORDER — HYDRALAZINE HCL 20 MG/ML IJ SOLN
10.0000 mg | Freq: Four times a day (QID) | INTRAMUSCULAR | Status: DC | PRN
  Administered 2024-07-01: 10 mg via INTRAVENOUS
  Filled 2024-06-10: qty 1

## 2024-06-10 MED ORDER — ACETAMINOPHEN 10 MG/ML IV SOLN
1000.0000 mg | Freq: Four times a day (QID) | INTRAVENOUS | Status: AC
  Administered 2024-06-10 – 2024-06-11 (×3): 1000 mg via INTRAVENOUS
  Filled 2024-06-10 (×3): qty 100

## 2024-06-10 MED ORDER — ENOXAPARIN SODIUM 40 MG/0.4ML IJ SOSY
40.0000 mg | PREFILLED_SYRINGE | INTRAMUSCULAR | Status: DC
  Administered 2024-06-10 – 2024-06-15 (×6): 40 mg via SUBCUTANEOUS
  Filled 2024-06-10 (×6): qty 0.4

## 2024-06-10 MED ORDER — MAGNESIUM SULFATE IN D5W 1-5 GM/100ML-% IV SOLN
1.0000 g | Freq: Once | INTRAVENOUS | Status: AC
  Administered 2024-06-10: 1 g via INTRAVENOUS
  Filled 2024-06-10: qty 100

## 2024-06-10 NOTE — TOC Progression Note (Signed)
 Transition of Care [facility]) - Progression Note    Patient Details  Name: Lindsay Mason MRN: [id number] Date of Birth: 09/22/1951  Transition of Care [facility]) CM/SW Contact  Sheri ONEIDA [place], [place] Phone Number: 06/10/2024, 3:58 PM  Clinical Narrative:    CSW left vm for pt dtr [name] to discuss rec for HHPT; awaiting call back.   Expected Discharge Plan: Home w Home Health Services Barriers to Discharge: Continued Medical Work up               Expected Discharge Plan and Services                                               Social Drivers of Health (SDOH) Interventions SDOH Screenings   Food Insecurity: No Food Insecurity (06/02/2024)  Housing: Low Risk  (06/02/2024)  Transportation Needs: No Transportation Needs (06/02/2024)  Utilities: Not At Risk (06/02/2024)  Alcohol Screen: Low Risk  (05/05/2024)  Depression (PHQ2-9): High Risk (05/07/2024)  Financial Resource Strain: Low Risk  (05/05/2024)  Physical Activity: Sufficiently Active (05/05/2024)  Social Connections: Moderately Isolated (06/02/2024)  Stress: No Stress Concern Present (05/05/2024)  Tobacco Use: Medium Risk (06/09/2024)  Health Literacy: Inadequate Health Literacy (05/05/2024)    Readmission Risk Interventions    01/11/2024   10:01 AM  Readmission Risk Prevention Plan  Post Dischage Appt Complete  Medication Screening Complete  Transportation Screening Complete

## 2024-06-10 NOTE — Anesthesia Postprocedure Evaluation (Signed)
 Anesthesia Post Note  Patient: Lindsay Mason  Procedure(s) Performed: LAPAROSCOPY, DIAGNOSTIC, EXPLORATORY LAPAROTOMY WITH SMALL BOWEL RESECTION     Patient location during evaluation: PACU Anesthesia Type: General Level of consciousness: awake Pain management: pain level controlled Vital Signs Assessment: post-procedure vital signs reviewed and stable Respiratory status: spontaneous breathing, nonlabored ventilation, respiratory function stable and patient connected to nasal cannula oxygen Cardiovascular status: blood pressure returned to baseline and stable Postop Assessment: no apparent nausea or vomiting Anesthetic complications: no   No notable events documented.  Last Vitals:  Vitals:   06/10/24 0302 06/10/24 0541  BP: (!) 152/98 (!) 155/93  Pulse: 84 94  Resp: 16 16  Temp: 36.8 C 37.3 C  SpO2: 100% 100%    Last Pain:  Vitals:   06/10/24 1050  TempSrc:   PainSc: 10-Worst pain ever                 Garnette LABOR Gab

## 2024-06-10 NOTE — Progress Notes (Signed)
   06/10/24 1335  PT Visit Information  Last PT Received On 06/10/24  Reason Eval/Treat Not Completed Fatigue/lethargy limiting ability to participate;Pain limiting ability to participate  History of Present Illness Pt is 73 yo female presented on 06/02/24  with + blood culture with staph species, lethargy, and worsening mental status. Pt admitted with SIRS, abdominal pain, N/V likely secondary to acute diverticulitis, and SBO vs ileus.  Pt with NG tube and surgery following.  Pt also with metabolic encephalopathy on background of Alzheimer's dementia.  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   Stann, PT Acute Rehabilitation Services Office: [phone] 06/10/2024

## 2024-06-10 NOTE — Progress Notes (Signed)
 PHARMACY - TOTAL PARENTERAL NUTRITION CONSULT NOTE   Indication: Prolonged ileus  Patient Measurements: Height: 5' 2 (157.5 cm) Weight: 61.3 kg (135 lb 2.3 oz) IBW/kg (Calculated) : 50.1 TPN AdjBW (KG): 61.3 Body mass index is 24.72 kg/m.   Assessment: Presented to ED with N/V/D and abdominal pain x 3d on 05/31/24. CT negative. Possibly from diverticulitis and placed on Cefepime /Flagyl . Concern for SBO, NGT placed. Continues to have persistent pain and distention. No significant intake since 8/21. Pharmacy consulted to start TPN on 8/26.  Remains NPO, s/p ex lap with small bowel resection.  Glucose / Insulin : No h/o DM -CBGs 127-236 (expect spike 8/27 pm related to dose of steroids in the OR) -10u SSI/24 hrs Electrolytes: phos 2.3, K 4.1, Mg 1.9 Renal: Scr <1 Hepatic: LFTs WNL; alb 3.3 Intake / Output; MIVF:  -NGT 1 L yesterday, ~ 100 ml this am -UOP good -LBM 8/27  GI Imaging: 8/19 CT: Scattered colonic diverticulosis. No active diverticulitis.  8/22, 8/23, 8/25, 8/26 Abd Xray: all SBO 8/26 CT: severely thickened and inflamed central pelvic loops of small bowel concerning for ischemia. Concern for mesenteric volvulus or closed loop obstruction. GI Surgeries / Procedures:  8/27 Ex lap with small bowel resection  Central access: PICC TPN start date: 8/26  Nutritional Goals: Goal TPN rate is 80 mL/hr (provides 86 g of protein and  1759 kcals per day)  RD Assessment: Kcal 1650-1850 Protein 75-90 g Fluid 1.8 L/day  Current Nutrition:  NPO  Plan:  -Repeat K phos  15 mmol IV x 1 -Mg 1 g IV x 1 -Continue TPN at goal 80 mL/hr -Electrolytes in TPN: Na 50 mEq/L, K 40 mEq/L, Ca 5 mEq/L, Mg 5 mEq/L, and Phos 15 mmol/L. Cl:Ac 1:1 -Add standard MVI and trace elements to TPN -Continue thiamine  100 mg IV x 5 days for refeeding risk -Continue Sensitive q6h SSI and adjust as needed  -Monitor TPN labs on Mon/Thurs, and PRN    Stefano MARLA Bologna, PharmD, BCPS Clinical  Pharmacist 06/10/2024 9:50 AM

## 2024-06-10 NOTE — Plan of Care (Signed)
   Problem: Education: Goal: Knowledge of General Education information will improve Description Including pain rating scale, medication(s)/side effects and non-pharmacologic comfort measures Outcome: Progressing   Problem: Health Behavior/Discharge Planning: Goal: Ability to manage health-related needs will improve Outcome: Progressing

## 2024-06-10 NOTE — Progress Notes (Signed)
 Progress Note  1 Day Post-Op  Subjective: Pain seems relatively well controlled today.  No bowel function as able to tell.  NGT output is minimal.  Hasn't been out of bed yet.  Family at bedside   Objective: Vital signs in last 24 hours: Temp:  [97.7 F (36.5 C)-99.1 F (37.3 C)] 99.1 F (37.3 C) (08/28 0541) Pulse Rate:  [82-94] 94 (08/28 0541) Resp:  [16-26] 16 (08/28 0541) BP: (132-161)/(91-103) 155/93 (08/28 0541) SpO2:  [82 %-100 %] 100 % (08/28 0541) Last BM Date : 06/09/24  Intake/Output from previous day: 08/27 0701 - 08/28 0700 In: 3806.2 [I.V.:2434.2; IV Piggyback:1372] Out: 3085 [Urine:2775; Emesis/NG output:260; Blood:50] Intake/Output this shift: No intake/output data recorded.  PE: General: NAD Lungs: Respiratory effort nonlabored Abd: soft, appropriately tender, NGT in place with minimal bilious output, 260cc documented yesterday.   GU: foley in place with great UOP   Lab Results:  Recent Labs    06/08/24 0531 06/09/24 0208  WBC 12.1* 12.1*  HGB 12.4 11.9*  HCT 39.4 37.4  PLT 324 318   BMET Recent Labs    06/09/24 0208 06/10/24 0332  NA 144 140  K 3.5 4.1  CL 105 103  CO2 27 24  GLUCOSE 113* 181*  BUN <5* 9  CREATININE 0.40* 0.43*  CALCIUM  8.1* 8.1*   PT/INR No results for input(s): LABPROT, INR in the last 72 hours. CMP     Component Value Date/Time   NA 140 06/10/2024 0332   NA 144 03/02/2024 1554   K 4.1 06/10/2024 0332   CL 103 06/10/2024 0332   CO2 24 06/10/2024 0332   GLUCOSE 181 (H) 06/10/2024 0332   BUN 9 06/10/2024 0332   BUN 20 03/02/2024 1554   CREATININE 0.43 (L) 06/10/2024 0332   CALCIUM  8.1 (L) 06/10/2024 0332   PROT 5.0 (L) 06/10/2024 0332   PROT 6.1 03/02/2024 1554   ALBUMIN  3.3 (L) 06/10/2024 0332   ALBUMIN  3.9 03/02/2024 1554   AST 22 06/10/2024 0332   ALT 15 06/10/2024 0332   ALKPHOS 59 06/10/2024 0332   BILITOT 0.3 06/10/2024 0332   BILITOT 0.2 03/02/2024 1554   GFRNONAA >60 06/10/2024 0332    GFRAA >60 01/22/2020 0226   Lipase     Component Value Date/Time   LIPASE 27 05/31/2024 2054       Studies/Results: CT ABDOMEN PELVIS W CONTRAST Result Date: 06/08/2024 CLINICAL DATA:  Abdominal pain and distension. Dilated small bowel on KUB 06/08/2024 raising concern for small bowel obstruction. EXAM: CT ABDOMEN AND PELVIS WITH CONTRAST TECHNIQUE: Multidetector CT imaging of the abdomen and pelvis was performed using the standard protocol following bolus administration of intravenous contrast. RADIATION DOSE REDUCTION: This exam was performed according to the departmental dose-optimization program which includes automated exposure control, adjustment of the mA and/or kV according to patient size and/or use of iterative reconstruction technique. CONTRAST:  OMNIPAQUE  IOHEXOL  300 MG/ML  SOLN COMPARISON:  06/01/2024 abdominal radiograph 06/08/2024 FINDINGS: Lower chest: New small bilateral pleural effusions and associated passive atelectasis. New mild cardiomegaly. A nasogastric tube extends in the distal esophagus and into the stomach body. Hepatobiliary: Small subcapsular cyst in segment 8 on image 12 series 12, similar to previous. Contracted gallbladder. Pancreas: Unremarkable Spleen: Unremarkable Adrenals/Urinary Tract: Stable fullness of the adrenal glands, especially along the left side, without a discrete mass. Bosniak category 1 cyst of the left kidney lower pole. Urinary bladder unremarkable. Stomach/Bowel: Nasogastric tube terminates in the stomach body. Sigmoid colon diverticulosis. Contrast  medium in normal appendix. Complex appearance of severely thickened loops of central pelvic small bowel with indistinct bowel wall margins, surrounding edema, dilated small bowel extending towards these loops, and at least some sense of twisting of bowel for example in the central abdomen on images 52-56 of series 2 along adjacent branches of the superior mesenteric vein concerning for mesenteric  volvulus or closed loop obstruction. No definite pneumatosis or extraluminal gas. The indistinctness of bowel walls makes this region difficult to assess, and the possibility of intussusception is also not completely excluded. There is some small loculations of gas for example near the left inguinal ring potentially along the thickened bowel which are presumably intraluminal but could be contained tiny amounts of extraluminal gas. Vascular/Lymphatic: Patent celiac trunk and SMA. Aortoiliac atherosclerosis. Reproductive: Unremarkable fluid density lesion along the right adnexa 4.5 by 3.1 cm with single internal septation, this could be due to hydrosalpinx or ovarian cysts. This has been present and not hypermetabolic on prior PET-CT. Other: Mild ascites.  Presacral edema.  Mild mesenteric edema. Musculoskeletal: Dextroconvex thoracolumbar scoliosis with substantial rotary component. Healed left posterior rib fractures. Mild deformity of the left pubic bone potentially from old fracture. Bone island in the left sacrum. IMPRESSION: 1. Severely thickened and inflamed central pelvic loops of small bowel concerning for ischemia. Complex appearance due to indistinct bowel wall margins, surrounding edema, dilated small bowel extending towards these loops, and at least some sense of twisting of bowel along adjacent branches of the superior mesenteric vein concerning for mesenteric volvulus or closed loop obstruction. No definite pneumatosis. Small locules of gas along the left inguinal ring and along the inflamed loops of bowel are presumably intraluminal but could conceivably represent contained extraluminal gas from local microperforation. 2. New small bilateral pleural effusions and associated passive atelectasis. 3. New mild cardiomegaly. 4. Mild ascites. 5. Sigmoid colon diverticulosis. 6. Stable fluid density lesion along the right adnexa with single internal septation, this could be due to hydrosalpinx or ovarian  cysts. This has been present and not hypermetabolic on prior PET-CT. 7. 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 8.  Aortic Atherosclerosis (ICD10-I70.0). Electronically Signed   By: Ryan Salvage M.D.   On: 06/08/2024 17:45    Anti-infectives: Anti-infectives (From admission, onward)    Start     Dose/Rate Route Frequency Ordered Stop   06/10/24 0300  ceFEPIme  (MAXIPIME ) 2 g in sodium chloride  0.9 % 100 mL IVPB        2 g 200 mL/hr over 30 Minutes Intravenous Every 12 hours 06/09/24 1732     06/09/24 2100  metroNIDAZOLE  (FLAGYL ) IVPB 500 mg        500 mg 100 mL/hr over 60 Minutes Intravenous Every 12 hours 06/09/24 1725 06/16/24 2059   06/09/24 1730  ceFEPIme  (MAXIPIME ) 1 g in sodium chloride  0.9 % 100 mL IVPB  Status:  Discontinued        1 g 200 mL/hr over 30 Minutes Intravenous Every 12 hours 06/09/24 1725 06/09/24 1731   06/09/24 1500  ceFEPIme  (MAXIPIME ) 2 g in sodium chloride  0.9 % 100 mL IVPB        2 g 200 mL/hr over 30 Minutes Intravenous  Once 06/09/24 1454 06/09/24 1743   06/03/24 1130  vancomycin  (VANCOCIN ) IVPB 1000 mg/200 mL premix  Status:  Discontinued        1,000 mg 200 mL/hr over 60 Minutes Intravenous Every 24 hours 06/02/24 1149 06/03/24 0949   06/03/24 0000  ceFEPIme  (MAXIPIME ) 2 g  in sodium chloride  0.9 % 100 mL IVPB        2 g 200 mL/hr over 30 Minutes Intravenous Every 12 hours 06/02/24 1149 06/08/24 1553   06/02/24 2200  metroNIDAZOLE  (FLAGYL ) IVPB 500 mg  Status:  Discontinued        500 mg 100 mL/hr over 60 Minutes Intravenous Every 12 hours 06/02/24 1113 06/09/24 1730   06/02/24 1100  ceFEPIme  (MAXIPIME ) 2 g in sodium chloride  0.9 % 100 mL IVPB        2 g 200 mL/hr over 30 Minutes Intravenous  Once 06/02/24 1045 06/02/24 1449   06/02/24 1100  vancomycin  (VANCOCIN ) IVPB 1000 mg/200 mL premix        1,000 mg 200 mL/hr over 60 Minutes Intravenous  Once 06/02/24 1045 06/02/24 1233   06/02/24 1100  metroNIDAZOLE  (FLAGYL )  IVPB 500 mg        500 mg 100 mL/hr over 60 Minutes Intravenous  Once 06/02/24 1045 06/02/24 1818        Assessment/Plan POD 1, s/p 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  This care required moderate level of medical decision making.    Burnard FORBES Banter, [facility] 06/10/2024, 11:22 AM Please see Amion for pager number during day hours 7:00am-4:30pm

## 2024-06-10 NOTE — Plan of Care (Signed)
   Problem: Health Behavior/Discharge Planning: Goal: Ability to manage health-related needs will improve Outcome: Progressing   Problem: Clinical Measurements: Goal: Ability to maintain clinical measurements within normal limits will improve Outcome: Progressing   Problem: Activity: Goal: Risk for activity intolerance will decrease Outcome: Progressing   Problem: Nutrition: Goal: Adequate nutrition will be maintained Outcome: Progressing

## 2024-06-10 NOTE — Progress Notes (Signed)
 PROGRESS NOTE  [name] FMW:[id number] DOB: Nov 29, 1950 DOA: 06/02/2024 PCP: [name], MD   LOS: 8 days   Brief narrative:  Lindsay Mason is a 73 y.o. 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  Patient had lethargy and worsening mental status.  In the ED, patient had slightly low blood pressure.  Labs were notable for WBC elevated at 13.7.  CT abdomen/pelvis done on 06/01/2024 with no acute findings but scattered colonic diverticulosis with small diverticulitis.  Patient was then considered for admission to the hospital for further evaluation and treatment. During hospitalization, patient continued to have abdominal distention and pain so general surgery was consulted.  Currently, being treated for small bowel obstruction/ileus initially conservatively but since that did not improve and had significant CT findings, underwent exploratory laparotomy with small bowel resection on 06/09/2024.   Assessment/Plan: Principal Problem:   SIRS (systemic inflammatory response syndrome) (HCC) Active Problems:   COPD with emphysema (HCC)   Thoracic aortic atherosclerosis (HCC)   History of essential hypertension   Severe early onset Alzheimer dementia (HCC)   PAD (peripheral artery disease) (HCC)   Confusion and disorientation   Positive blood culture   Nausea and vomiting   Abdominal pain   Hyperlipidemia   Abnormal EKG   Abdominal pain/  Nausea and vomiting likely secondary to acute diverticulitis. Sepsis likely secondary to possible acute diverticulitis Small bowel obstruction/ileus Patient initially had leukocytosis with hypotension and lethargy and clinical suspicion for diverticulitis suggestive of  possible sepsis.  Initial CT scan of the abdomen initially was negative for acute findings but had clinical symptoms of diverticulitis.  Patient was continued on cefepime  and metronidazole .  Blood cultures negative in  5 days.  General surgery was consulted due to persistent abdominal distention and pain.  Initial imaging with x-ray of the abdomen showed possible SBO/ileus.  Patient was then started on conservative treatment with NG tube IV fluid with electrolyte replacement but continued to have pain and distention with NG output.  Repeat CT scan done 06/08/2024 showed significant thickening of the pelvic loops of the small bowel concerning for ischemia.  Patient on underwent diagnostic laparoscopy, exploratory laparotomy with small bowel resection on 06/09/2024.  Currently NG tube, IV fluids and TPN.  Follow general surgery recommendation.    Hypophosphatemia.  Phosphorus of 2.3 today.  Will continue to replenish with K-Phos.  Check levels in AM.  Hypokalemia.  Improved after repletion.  Potassium today at 4.1.  Metabolic encephalopathy on the background of Alzheimer's dementia.   Likely secondary to infection.  On nortriptyline  trazodone  Exelon  at home..  On mittens at this time.  Family at bedside.     Positive blood culture with staph species. Contamination was suspected.  Vancomycin  was discontinued.     COPD with emphysema  Continue albuterol  inhaler.  Appears compensated.  On room air     Hyperlipidemia   PAD (peripheral artery disease)    Thoracic aortic atherosclerosis On Lipitor at home, we will hold off for now.     History of essential hypertension Currently not on any medication.  As needed IV antihypertensive is necessary.  Weakness, deconditioning.  Seen by PT and recommend home health PT on discharge.  DVT prophylaxis: SCDs Start: 06/02/24 1114   Disposition: Home with home health likely  in 2 to 3 days,  Status is: Inpatient Remains inpatient appropriate because: Pending  clinical improvement,exploratory laparotomy    Code Status:     Code Status: Full Code  Family Communication: Spoke with the patient's sister at bedside today  Consultants: General Surgery  Procedures: NG tube placement  Anti-infectives:  Cefepime  and Flagyl  IV  Anti-infectives (From admission, onward)    Start     Dose/Rate Route Frequency Ordered Stop   06/10/24 0300  ceFEPIme  (MAXIPIME ) 2 g in sodium chloride  0.9 % 100 mL IVPB        2 g 200 mL/hr over 30 Minutes Intravenous Every 12 hours 06/09/24 1732     06/09/24 2100  metroNIDAZOLE  (FLAGYL ) IVPB 500 mg        500 mg 100 mL/hr over 60 Minutes Intravenous Every 12 hours 06/09/24 1725 06/16/24 2059   06/09/24 1730  ceFEPIme  (MAXIPIME ) 1 g in sodium chloride  0.9 % 100 mL IVPB  Status:  Discontinued        1 g 200 mL/hr over 30 Minutes Intravenous Every 12 hours 06/09/24 1725 06/09/24 1731   06/09/24 1500  ceFEPIme  (MAXIPIME ) 2 g in sodium chloride  0.9 % 100 mL IVPB        2 g 200 mL/hr over 30 Minutes Intravenous  Once 06/09/24 1454 06/09/24 1743   06/03/24 1130  vancomycin  (VANCOCIN ) IVPB 1000 mg/200 mL premix  Status:  Discontinued        1,000 mg 200 mL/hr over 60 Minutes Intravenous Every 24 hours 06/02/24 1149 06/03/24 0949   06/03/24 0000  ceFEPIme  (MAXIPIME ) 2 g in sodium chloride  0.9 % 100 mL IVPB        2 g 200 mL/hr over 30 Minutes Intravenous Every 12 hours 06/02/24 1149 06/08/24 1553   06/02/24 2200  metroNIDAZOLE  (FLAGYL ) IVPB 500 mg  Status:  Discontinued        500 mg 100 mL/hr over 60 Minutes Intravenous Every 12 hours 06/02/24 1113 06/09/24 1730   06/02/24 1100  ceFEPIme  (MAXIPIME ) 2 g in sodium chloride  0.9 % 100 mL IVPB        2 g 200 mL/hr over 30 Minutes Intravenous  Once 06/02/24 1045 06/02/24 1449   06/02/24 1100  vancomycin  (VANCOCIN ) IVPB 1000 mg/200 mL premix        1,000 mg 200 mL/hr over 60 Minutes Intravenous  Once 06/02/24 1045 06/02/24 1233   06/02/24 1100  metroNIDAZOLE  (FLAGYL ) IVPB  500 mg        500 mg 100 mL/hr over 60 Minutes Intravenous  Once 06/02/24 1045 06/02/24 1818       Subjective: Today, patient was seen and examined at bedside.  Nursing staff and sister at bedside.  Had some pain and received IV medicine.  Patient however denies any pain or shortness of breath.  Objective: Vitals:   06/10/24 0302 06/10/24 0541  BP: (!) 152/98 (!) 155/93  Pulse: 84 94  Resp: 16 16  Temp: 98.3 F (36.8 C) 99.1 F (37.3 C)  SpO2: 100% 100%    Intake/Output Summary (Last 24 hours) at 06/10/2024 0853 Last data filed at 06/10/2024 0600 Gross per 24 hour  Intake 3706.2 ml  Output 2635 ml  Net 1071.2 ml   Filed Weights   06/02/24 0812  Weight: 61.3 kg   Body mass index is 24.72 kg/m.   Physical Exam:  General:  Average built, not in obvious distress, NG tube in place, appears deconditioned and weak, HENT:   No scleral  pallor or icterus noted. Oral mucosa is moist.  NG tube in place. Chest: Decreased breath sounds bilaterally CVS: S1 &S2 heard. No murmur.  Regular rate and rhythm. Abdomen: Abdominal dressing in place, appropriate tenderness noted. Extremities: No cyanosis, clubbing or edema.  Peripheral pulses are palpable. Psych: Alert, awake with cognitive dysfunction. CNS:  No cranial nerve deficits.  Moves all extremities Skin: Warm and dry.  No rashes noted.   Data Review: I have personally reviewed the following laboratory data and studies,  CBC: Recent Labs  Lab 06/05/24 0736 06/06/24 0611 06/07/24 0633 06/08/24 0531 06/09/24 0208  WBC 4.2 8.8 7.8 12.1* 12.1*  HGB 10.7* 12.6 11.8* 12.4 11.9*  HCT 35.2* 39.6 38.1 39.4 37.4  MCV 93.9 93.4 91.4 91.0 90.1  PLT 252 307 309 324 318   Basic Metabolic Panel: Recent Labs  Lab 06/06/24 0611 06/07/24 0633 06/08/24 0531 06/09/24 0208 06/10/24 0332  NA 139 139 138 144 140  K 3.1* 3.1* 3.3* 3.5 4.1  CL 104 106 105 105 103  CO2 25 27 27 27 24   GLUCOSE 107* 99 108* 113* 181*  BUN 11 11 6* <5*  9  CREATININE 0.53 0.61 0.50 0.40* 0.43*  CALCIUM  7.7* 7.7* 7.7* 8.1* 8.1*  MG 1.8 1.8 2.0 2.0 1.9  PHOS  --   --  1.9* 2.3* 2.3*   Liver Function Tests: Recent Labs  Lab 06/08/24 0531 06/09/24 0208 06/10/24 0332  AST 30 24 22   ALT 21 17 15   ALKPHOS 64 74 59  BILITOT 0.3 0.3 0.3  PROT 5.0* 5.0* 5.0*  ALBUMIN  2.5* 2.9* 3.3*   No results for input(s): LIPASE, AMYLASE in the last 168 hours.  No results for input(s): AMMONIA in the last 168 hours. Cardiac Enzymes: No results for input(s): CKTOTAL, CKMB, CKMBINDEX, TROPONINI in the last 168 hours. BNP (last 3 results) No results for input(s): BNP in the last 8760 hours.  ProBNP (last 3 results) No results for input(s): PROBNP in the last 8760 hours.  CBG: Recent Labs  Lab 06/09/24 0627 06/09/24 1128 06/09/24 1758 06/09/24 2300 06/10/24 0539  GLUCAP 153* 127* 197* 236* 159*   Recent Results (from the past 240 hours)  Blood Culture (routine x 2)     Status: Abnormal   Collection Time: 06/01/24  2:03 AM   Specimen: BLOOD  Result Value Ref Range Status   Specimen Description   Final    BLOOD RIGHT ANTECUBITAL Performed at [facility], [address], [place], [place] [address]    Special Requests   Final    BOTTLES DRAWN AEROBIC AND ANAEROBIC Blood Culture results may not be optimal due to an inadequate volume of blood received in culture bottles Performed at [facility], [address], [place], [place] [address]    Culture  Setup Time   Final    GRAM POSITIVE COCCI IN CLUSTERS AEROBIC BOTTLE ONLY CRITICAL RESULT CALLED TO, READ BACK BY AND VERIFIED WITH: RN MARVAL W [id number] FCP    Culture (A)  Final    STAPHYLOCOCCUS HOMINIS THE SIGNIFICANCE OF ISOLATING THIS ORGANISM FROM A SINGLE SET OF BLOOD CULTURES WHEN MULTIPLE SETS ARE DRAWN IS UNCERTAIN. PLEASE NOTIFY THE MICROBIOLOGY DEPARTMENT WITHIN ONE WEEK IF SPECIATION AND SENSITIVITIES ARE  REQUIRED. Performed at [facility], [address] [place], [place] [address]    Report Status 06/03/2024 FINAL  Final  Blood Culture ID Panel (Reflexed)     Status: Abnormal   Collection  Time: 06/01/24  2:03 AM  Result Value Ref Range Status   Enterococcus faecalis NOT DETECTED NOT DETECTED Final   Enterococcus Faecium NOT DETECTED NOT DETECTED Final   Listeria monocytogenes NOT DETECTED NOT DETECTED Final   Staphylococcus species DETECTED (A) NOT DETECTED Final    Comment: CRITICAL RESULT CALLED TO, READ BACK BY AND VERIFIED WITH: RN MARVAL ORN [id number] FCP    Staphylococcus aureus (BCID) NOT DETECTED NOT DETECTED Final   Staphylococcus epidermidis NOT DETECTED NOT DETECTED Final   Staphylococcus lugdunensis NOT DETECTED NOT DETECTED Final   Streptococcus species NOT DETECTED NOT DETECTED Final   Streptococcus agalactiae NOT DETECTED NOT DETECTED Final   Streptococcus pneumoniae NOT DETECTED NOT DETECTED Final   Streptococcus pyogenes NOT DETECTED NOT DETECTED Final   A.calcoaceticus-baumannii NOT DETECTED NOT DETECTED Final   Bacteroides fragilis NOT DETECTED NOT DETECTED Final   Enterobacterales NOT DETECTED NOT DETECTED Final   Enterobacter cloacae complex NOT DETECTED NOT DETECTED Final   Escherichia coli NOT DETECTED NOT DETECTED Final   Klebsiella aerogenes NOT DETECTED NOT DETECTED Final   Klebsiella oxytoca NOT DETECTED NOT DETECTED Final   Klebsiella pneumoniae NOT DETECTED NOT DETECTED Final   Proteus species NOT DETECTED NOT DETECTED Final   Salmonella species NOT DETECTED NOT DETECTED Final   Serratia marcescens NOT DETECTED NOT DETECTED Final   Haemophilus influenzae NOT DETECTED NOT DETECTED Final   Neisseria meningitidis NOT DETECTED NOT DETECTED Final   Pseudomonas aeruginosa NOT DETECTED NOT DETECTED Final   Stenotrophomonas maltophilia NOT DETECTED NOT DETECTED Final   Candida albicans NOT DETECTED NOT DETECTED Final   Candida auris NOT DETECTED NOT  DETECTED Final   Candida glabrata NOT DETECTED NOT DETECTED Final   Candida krusei NOT DETECTED NOT DETECTED Final   Candida parapsilosis NOT DETECTED NOT DETECTED Final   Candida tropicalis NOT DETECTED NOT DETECTED Final   Cryptococcus neoformans/gattii NOT DETECTED NOT DETECTED Final    Comment: Performed at [facility], [address] [place], [place] [address]  Blood Culture (routine x 2)     Status: None   Collection Time: 06/01/24  2:08 AM   Specimen: BLOOD  Result Value Ref Range Status   Specimen Description   Final    BLOOD BLOOD RIGHT HAND Performed at [facility], [address], [place], [place] [address]    Special Requests   Final    BOTTLES DRAWN AEROBIC AND ANAEROBIC Blood Culture results may not be optimal due to an inadequate volume of blood received in culture bottles Performed at [facility], [address] [place], [place] [address]    Culture   Final    NO GROWTH 5 DAYS Performed at [facility], [address] [place], [place] [address]    Report Status 06/06/2024 FINAL  Final  Resp panel by RT-PCR (RSV, Flu A&B, Covid) Anterior Nasal Swab     Status: None   Collection Time: 06/01/24  2:30 AM   Specimen: Anterior Nasal Swab  Result Value Ref Range Status   SARS Coronavirus 2 by RT PCR NEGATIVE NEGATIVE Final    Comment: (NOTE) SARS-CoV-2 target nucleic acids are NOT DETECTED.  The SARS-CoV-2 RNA is generally detectable in upper respiratory specimens during the acute phase of infection. The lowest concentration of SARS-CoV-2 viral copies this assay can detect is 138 copies/mL. A negative result does not preclude SARS-Cov-2 infection and should not be used as the sole basis for treatment or other  patient management decisions. A negative result may occur with  improper specimen collection/handling, submission of specimen other than nasopharyngeal swab, presence of viral mutation(s) within the areas  targeted by this assay, and inadequate number of viral copies(<138 copies/mL). A negative result must be combined with clinical observations, patient history, and epidemiological information. The expected result is Negative.  Fact Sheet for Patients:  BloggerCourse.com  Fact Sheet for Healthcare Providers:  SeriousBroker.it  This test is no t yet approved or cleared by the [place]  FDA and  has been authorized for detection and/or diagnosis of SARS-CoV-2 by FDA under an Emergency Use Authorization (EUA). This EUA will remain  in effect (meaning this test can be used) for the duration of the COVID-19 declaration under Section 564(b)(1) of the Act, 21 U.S.C.section 360bbb-3(b)(1), unless the authorization is terminated  or revoked sooner.       Influenza A by PCR NEGATIVE NEGATIVE Final   Influenza B by PCR NEGATIVE NEGATIVE Final    Comment: (NOTE) The Xpert Xpress SARS-CoV-2/FLU/RSV plus assay is intended as an aid in the diagnosis of influenza from Nasopharyngeal swab specimens and should not be used as a sole basis for treatment. Nasal washings and aspirates are unacceptable for Xpert Xpress SARS-CoV-2/FLU/RSV testing.  Fact Sheet for Patients: BloggerCourse.com  Fact Sheet for Healthcare Providers: SeriousBroker.it  This test is not yet approved or cleared by the [place]  FDA and has been authorized for detection and/or diagnosis of SARS-CoV-2 by FDA under an Emergency Use Authorization (EUA). This EUA will remain in effect (meaning this test can be used) for the duration of the COVID-19 declaration under Section 564(b)(1) of the Act, 21 U.S.C. section 360bbb-3(b)(1), unless the authorization is terminated or revoked.     Resp Syncytial Virus by PCR NEGATIVE NEGATIVE Final    Comment: (NOTE) Fact Sheet for  Patients: BloggerCourse.com  Fact Sheet for Healthcare Providers: SeriousBroker.it  This test is not yet approved or cleared by the [place]  FDA and has been authorized for detection and/or diagnosis of SARS-CoV-2 by FDA under an Emergency Use Authorization (EUA). This EUA will remain in effect (meaning this test can be used) for the duration of the COVID-19 declaration under Section 564(b)(1) of the Act, 21 U.S.C. section 360bbb-3(b)(1), unless the authorization is terminated or revoked.  Performed at [facility], [address] [place], [place] [address]   Blood Culture (routine x 2)     Status: None   Collection Time: 06/02/24  8:35 AM   Specimen: BLOOD  Result Value Ref Range Status   Specimen Description   Final    BLOOD RIGHT ANTECUBITAL Performed at [facility], [address], [place], [place] [address]    Special Requests   Final    BOTTLES DRAWN AEROBIC AND ANAEROBIC Blood Culture adequate volume Performed at [facility], [address], [place], [place] [address]    Culture   Final    NO GROWTH 5 DAYS Performed at [facility], [address] [place], [place] [address]    Report Status 06/07/2024 FINAL  Final  Resp panel by RT-PCR (RSV, Flu A&B, Covid) Anterior Nasal Swab     Status: None   Collection Time: 06/02/24 10:14 AM   Specimen: Anterior Nasal Swab  Result Value Ref Range Status   SARS Coronavirus 2 by RT PCR NEGATIVE NEGATIVE Final    Comment: (NOTE) SARS-CoV-2 target nucleic acids are NOT DETECTED.  The SARS-CoV-2 RNA is generally detectable  in upper respiratory specimens during the acute phase of infection. The lowest concentration of SARS-CoV-2 viral copies this assay can detect is 138 copies/mL. A negative result does not preclude SARS-Cov-2 infection and should not be used as the sole basis for treatment or other patient  management decisions. A negative result may occur with  improper specimen collection/handling, submission of specimen other than nasopharyngeal swab, presence of viral mutation(s) within the areas targeted by this assay, and inadequate number of viral copies(<138 copies/mL). A negative result must be combined with clinical observations, patient history, and epidemiological information. The expected result is Negative.  Fact Sheet for Patients:  BloggerCourse.com  Fact Sheet for Healthcare Providers:  SeriousBroker.it  This test is no t yet approved or cleared by the [place]  FDA and  has been authorized for detection and/or diagnosis of SARS-CoV-2 by FDA under an Emergency Use Authorization (EUA). This EUA will remain  in effect (meaning this test can be used) for the duration of the COVID-19 declaration under Section 564(b)(1) of the Act, 21 U.S.C.section 360bbb-3(b)(1), unless the authorization is terminated  or revoked sooner.       Influenza A by PCR NEGATIVE NEGATIVE Final   Influenza B by PCR NEGATIVE NEGATIVE Final    Comment: (NOTE) The Xpert Xpress SARS-CoV-2/FLU/RSV plus assay is intended as an aid in the diagnosis of influenza from Nasopharyngeal swab specimens and should not be used as a sole basis for treatment. Nasal washings and aspirates are unacceptable for Xpert Xpress SARS-CoV-2/FLU/RSV testing.  Fact Sheet for Patients: BloggerCourse.com  Fact Sheet for Healthcare Providers: SeriousBroker.it  This test is not yet approved or cleared by the [place]  FDA and has been authorized for detection and/or diagnosis of SARS-CoV-2 by FDA under an Emergency Use Authorization (EUA). This EUA will remain in effect (meaning this test can be used) for the duration of the COVID-19 declaration under Section 564(b)(1) of the Act, 21 U.S.C. section 360bbb-3(b)(1),  unless the authorization is terminated or revoked.     Resp Syncytial Virus by PCR NEGATIVE NEGATIVE Final    Comment: (NOTE) Fact Sheet for Patients: BloggerCourse.com  Fact Sheet for Healthcare Providers: SeriousBroker.it  This test is not yet approved or cleared by the [place]  FDA and has been authorized for detection and/or diagnosis of SARS-CoV-2 by FDA under an Emergency Use Authorization (EUA). This EUA will remain in effect (meaning this test can be used) for the duration of the COVID-19 declaration under Section 564(b)(1) of the Act, 21 U.S.C. section 360bbb-3(b)(1), unless the authorization is terminated or revoked.  Performed at [facility], [address] [place], [place] [address]   Blood Culture (routine x 2)     Status: None   Collection Time: 06/02/24 12:45 PM   Specimen: BLOOD  Result Value Ref Range Status   Specimen Description   Final    BLOOD BLOOD LEFT ARM Performed at [facility], [address], [place], [place] [address]    Special Requests   Final    BOTTLES DRAWN AEROBIC ONLY Blood Culture adequate volume Performed at [facility], [address] [place], [place] [address]    Culture   Final    NO GROWTH 5 DAYS Performed at [facility], [address], [place], [place] [address]    Report Status 06/07/2024 FINAL  Final     Studies: CT ABDOMEN PELVIS W CONTRAST Result Date: 06/08/2024 CLINICAL DATA:  Abdominal pain and distension. Dilated small bowel  on KUB 06/08/2024 raising concern for small bowel obstruction. EXAM: CT ABDOMEN AND PELVIS WITH CONTRAST TECHNIQUE: Multidetector CT imaging of the abdomen and pelvis was performed using the standard protocol following bolus administration of intravenous contrast. RADIATION DOSE REDUCTION: This exam was performed according to the departmental dose-optimization program which  includes automated exposure control, adjustment of the mA and/or kV according to patient size and/or use of iterative reconstruction technique. CONTRAST:  OMNIPAQUE  IOHEXOL  300 MG/ML  SOLN COMPARISON:  06/01/2024 abdominal radiograph 06/08/2024 FINDINGS: Lower chest: New small bilateral pleural effusions and associated passive atelectasis. New mild cardiomegaly. A nasogastric tube extends in the distal esophagus and into the stomach body. Hepatobiliary: Small subcapsular cyst in segment 8 on image 12 series 12, similar to previous. Contracted gallbladder. Pancreas: Unremarkable Spleen: Unremarkable Adrenals/Urinary Tract: Stable fullness of the adrenal glands, especially along the left side, without a discrete mass. Bosniak category 1 cyst of the left kidney lower pole. Urinary bladder unremarkable. Stomach/Bowel: Nasogastric tube terminates in the stomach body. Sigmoid colon diverticulosis. Contrast medium in normal appendix. Complex appearance of severely thickened loops of central pelvic small bowel with indistinct bowel wall margins, surrounding edema, dilated small bowel extending towards these loops, and at least some sense of twisting of bowel for example in the central abdomen on images 52-56 of series 2 along adjacent branches of the superior mesenteric vein concerning for mesenteric volvulus or closed loop obstruction. No definite pneumatosis or extraluminal gas. The indistinctness of bowel walls makes this region difficult to assess, and the possibility of intussusception is also not completely excluded. There is some small loculations of gas for example near the left inguinal ring potentially along the thickened bowel which are presumably intraluminal but could be contained tiny amounts of extraluminal gas. Vascular/Lymphatic: Patent celiac trunk and SMA. Aortoiliac atherosclerosis. Reproductive: Unremarkable fluid density lesion along the right adnexa 4.5 by 3.1 cm with single internal septation,  this could be due to hydrosalpinx or ovarian cysts. This has been present and not hypermetabolic on prior PET-CT. Other: Mild ascites.  Presacral edema.  Mild mesenteric edema. Musculoskeletal: Dextroconvex thoracolumbar scoliosis with substantial rotary component. Healed left posterior rib fractures. Mild deformity of the left pubic bone potentially from old fracture. Bone island in the left sacrum. IMPRESSION: 1. Severely thickened and inflamed central pelvic loops of small bowel concerning for ischemia. Complex appearance due to indistinct bowel wall margins, surrounding edema, dilated small bowel extending towards these loops, and at least some sense of twisting of bowel along adjacent branches of the superior mesenteric vein concerning for mesenteric volvulus or closed loop obstruction. No definite pneumatosis. Small locules of gas along the left inguinal ring and along the inflamed loops of bowel are presumably intraluminal but could conceivably represent contained extraluminal gas from local microperforation. 2. New small bilateral pleural effusions and associated passive atelectasis. 3. New mild cardiomegaly. 4. Mild ascites. 5. Sigmoid colon diverticulosis. 6. Stable fluid density lesion along the right adnexa with single internal septation, this could be due to hydrosalpinx or ovarian cysts. This has been present and not hypermetabolic on prior PET-CT. 7. 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 8.  Aortic Atherosclerosis (ICD10-I70.0). Electronically Signed   By: Ryan Salvage M.D.   On: 06/08/2024 17:45   US  EKG SITE RITE Result Date: 06/08/2024 If Site Rite image not attached, placement could not be confirmed due to current cardiac rhythm.     Shajuana Mclucas, MD  [facility] 06/10/2024  If 7PM-7AM, please contact night-coverage

## 2024-06-11 DIAGNOSIS — R6510 Systemic inflammatory response syndrome (SIRS) of non-infectious origin without acute organ dysfunction: Secondary | ICD-10-CM | POA: Diagnosis not present

## 2024-06-11 DIAGNOSIS — G9341 Metabolic encephalopathy: Secondary | ICD-10-CM | POA: Diagnosis not present

## 2024-06-11 LAB — CBC
HCT: 34.5 % — ABNORMAL LOW (ref 36.0–46.0)
Hemoglobin: 11.2 g/dL — ABNORMAL LOW (ref 12.0–15.0)
MCH: 29.4 pg (ref 26.0–34.0)
MCHC: 32.5 g/dL (ref 30.0–36.0)
MCV: 90.6 fL (ref 80.0–100.0)
Platelets: 300 K/uL (ref 150–400)
RBC: 3.81 MIL/uL — ABNORMAL LOW (ref 3.87–5.11)
RDW: 14.5 % (ref 11.5–15.5)
WBC: 15.2 K/uL — ABNORMAL HIGH (ref 4.0–10.5)
nRBC: 0 % (ref 0.0–0.2)

## 2024-06-11 LAB — BASIC METABOLIC PANEL WITH GFR
Anion gap: 9 (ref 5–15)
BUN: 16 mg/dL (ref 8–23)
CO2: 28 mmol/L (ref 22–32)
Calcium: 8.3 mg/dL — ABNORMAL LOW (ref 8.9–10.3)
Chloride: 102 mmol/L (ref 98–111)
Creatinine, Ser: 0.46 mg/dL (ref 0.44–1.00)
GFR, Estimated: >60 mL/min (ref >60)
Glucose, Bld: 123 mg/dL — ABNORMAL HIGH (ref 70–99)
Potassium: 4.1 mmol/L (ref 3.5–5.1)
Sodium: 139 mmol/L (ref 135–145)

## 2024-06-11 LAB — SURGICAL PATHOLOGY

## 2024-06-11 LAB — MAGNESIUM: Magnesium: 2.1 mg/dL (ref 1.7–2.4)

## 2024-06-11 LAB — PHOSPHORUS: Phosphorus: 2.8 mg/dL (ref 2.5–4.6)

## 2024-06-11 LAB — GLUCOSE, CAPILLARY
Glucose-Capillary: 115 mg/dL — ABNORMAL HIGH (ref 70–99)
Glucose-Capillary: 130 mg/dL — ABNORMAL HIGH (ref 70–99)
Glucose-Capillary: 131 mg/dL — ABNORMAL HIGH (ref 70–99)

## 2024-06-11 MED ORDER — TRAVASOL 10 % IV SOLN
INTRAVENOUS | Status: AC
  Filled 2024-06-11: qty 864

## 2024-06-11 NOTE — Plan of Care (Signed)
   Problem: Fluid Volume: Goal: Hemodynamic stability will improve Outcome: Progressing   Problem: Clinical Measurements: Goal: Diagnostic test results will improve Outcome: Progressing

## 2024-06-11 NOTE — Progress Notes (Signed)
 PROGRESS NOTE  [name] FMW:[id number] DOB: May 15, 1951 DOA: 06/02/2024 PCP: [name], MD   LOS: 9 days   Brief narrative:  Lindsay Mason is a 73 y.o. 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  Patient had lethargy and worsening mental status.  In the ED, patient had slightly low blood pressure.  Labs were notable for WBC elevated at 13.7.  CT abdomen/pelvis done on 06/01/2024 with no acute findings but scattered colonic diverticulosis with small diverticulitis.  Patient was then considered for admission to the hospital for further evaluation and treatment. During hospitalization, patient continued to have abdominal distention and pain so general surgery was consulted.  Currently, being treated for small bowel obstruction/ileus initially conservatively but since that did not improve and had significant CT findings, underwent exploratory laparotomy with small bowel resection on 06/09/2024.  Patient is currently on TPN and awaiting for clinical improvement.  Assessment/Plan: Principal Problem:   SIRS (systemic inflammatory response syndrome) (HCC) Active Problems:   COPD with emphysema (HCC)   Thoracic aortic atherosclerosis (HCC)   History of essential hypertension   Severe early onset Alzheimer dementia (HCC)   PAD (peripheral artery disease) (HCC)   Confusion and disorientation   Positive blood culture   Nausea and vomiting   Abdominal pain   Hyperlipidemia   Abnormal EKG   Abdominal pain/  Nausea and vomiting  Sepsis likely secondary to possible acute diverticulitis Small bowel obstruction/ileus Patient initially had leukocytosis with hypotension and lethargy and clinical suspicion for  diverticulitis suggestive of possible sepsis.  Initial CT scan of the abdomen initially was negative for acute findings but had clinical symptoms of diverticulitis.  Patient was continued on cefepime  and metronidazole .  Blood cultures negative in  5 days.  General surgery was consulted due to persistent abdominal distention and pain.  Initial imaging with x-ray of the abdomen showed possible SBO/ileus.  Patient was then started on conservative treatment with NG tube IV fluid with electrolyte replacement but continued to have pain and distention with NG output.  Repeat CT scan done 06/08/2024 showed significant thickening of the pelvic loops of the small bowel concerning for ischemia.  Patient on underwent diagnostic laparoscopy, exploratory laparotomy with small bowel resection on 06/09/2024.  Currently NG tube, IV fluids and TPN.  Follow general surgery recommendation.  Has been having increased output from NG tube today.  No mention of increasing pain.  Hypophosphatemia.  Improved after replacement.  Latest phosphorus of 2.8.  Hypokalemia.  Improved after repletion.  Potassium today at 4.1.  Metabolic encephalopathy on the background of Alzheimer's dementia.   Likely secondary to infection.  On nortriptyline  trazodone  Exelon  at home..  On mittens at this time.  Family at bedside.  Has required one-to-one sitter at times.     Positive blood culture with staph species. Contamination was suspected.  Vancomycin  was discontinued.     COPD with emphysema  Continue albuterol  inhaler.  Appears compensated.  On room air     Hyperlipidemia   PAD (peripheral artery disease)    Thoracic aortic atherosclerosis On Lipitor at home, we will hold off for now.     History of essential hypertension Currently not on any medication.  As needed IV antihypertensive is necessary.  Weakness, deconditioning.  Seen by PT and recommend home  health PT on discharge.  DVT prophylaxis: enoxaparin  (LOVENOX ) injection 40 mg  Start: 06/10/24 1400 SCDs Start: 06/02/24 1114   Disposition: Home with home health likely in 2 to 3 days,  Status is: Inpatient Remains inpatient appropriate because: Pending clinical improvement,exploratory laparotomy    Code Status:     Code Status: Full Code  Family Communication: Spoke with the patient's son at bedside  Consultants: General Surgery  Procedures: NG tube placement Exploratory laparotomy with small bowel resection on 06/09/2024  Anti-infectives:  Cefepime  and Flagyl  IV  Anti-infectives (From admission, onward)    Start     Dose/Rate Route Frequency Ordered Stop   06/10/24 0300  ceFEPIme  (MAXIPIME ) 2 g in sodium chloride  0.9 % 100 mL IVPB        2 g 200 mL/hr over 30 Minutes Intravenous Every 12 hours 06/09/24 1732     06/09/24 2100  metroNIDAZOLE  (FLAGYL ) IVPB 500 mg        500 mg 100 mL/hr over 60 Minutes Intravenous Every 12 hours 06/09/24 1725 06/16/24 2059   06/09/24 1730  ceFEPIme  (MAXIPIME ) 1 g in sodium chloride  0.9 % 100 mL IVPB  Status:  Discontinued        1 g 200 mL/hr over 30 Minutes Intravenous Every 12 hours 06/09/24 1725 06/09/24 1731   06/09/24 1500  ceFEPIme  (MAXIPIME ) 2 g in sodium chloride  0.9 % 100 mL IVPB        2 g 200 mL/hr over 30 Minutes Intravenous  Once 06/09/24 1454 06/09/24 1743   06/03/24 1130  vancomycin  (VANCOCIN ) IVPB 1000 mg/200 mL premix  Status:  Discontinued        1,000 mg 200 mL/hr over 60 Minutes Intravenous Every 24 hours 06/02/24 1149 06/03/24 0949   06/03/24 0000  ceFEPIme  (MAXIPIME ) 2 g in sodium chloride  0.9 % 100 mL IVPB        2 g 200 mL/hr over 30 Minutes Intravenous Every 12 hours 06/02/24 1149 06/08/24 1553   06/02/24 2200  metroNIDAZOLE  (FLAGYL ) IVPB 500 mg  Status:  Discontinued        500 mg 100 mL/hr over 60 Minutes Intravenous Every 12 hours 06/02/24 1113 06/09/24 1730   06/02/24 1100  ceFEPIme  (MAXIPIME ) 2 g in sodium chloride  0.9 % 100 mL IVPB        2 g 200 mL/hr over 30 Minutes Intravenous   Once 06/02/24 1045 06/02/24 1449   06/02/24 1100  vancomycin  (VANCOCIN ) IVPB 1000 mg/200 mL premix        1,000 mg 200 mL/hr over 60 Minutes Intravenous  Once 06/02/24 1045 06/02/24 1233   06/02/24 1100  metroNIDAZOLE  (FLAGYL ) IVPB 500 mg        500 mg 100 mL/hr over 60 Minutes Intravenous  Once 06/02/24 1045 06/02/24 1818       Subjective: Today, patient was seen and examined at bedside.  Patient's son at bedside.  Patient poor historian but denies overt pain.  Has had no flatus or bowel movement.  Has been having increased NG tube output.  No fever chills shortness of breath.   Objective: Vitals:   06/10/24 2041 06/11/24 0544  BP: (!) 145/92 (!) 156/100  Pulse: 93 92  Resp: 16 16  Temp: 98.6 F (37 C) 99.4 F (37.4 C)  SpO2: 100% 98%    Intake/Output Summary (Last 24 hours) at 06/11/2024 1001 Last data filed at 06/11/2024 0557 Gross per 24 hour  Intake 2115.83 ml  Output 1425 ml  Net 690.83 ml  Filed Weights   06/02/24 0812  Weight: 61.3 kg   Body mass index is 24.72 kg/m.   Physical Exam:  General:  Average built, not in obvious distress, NG tube in place with darker fluid., appears deconditioned and weak, alert awake Communicative. HENT:   No scleral pallor or icterus noted. Oral mucosa is moist.  Chest: Decreased breath sounds bilaterally CVS: S1 &S2 heard. No murmur.  Regular rate and rhythm. Abdomen: Abdominal dressing in place.  Appropriate tenderness on palpation. Extremities: No cyanosis, clubbing or edema.  Peripheral pulses are palpable. Psych: Alert, awake with cognitive dysfunction. CNS:  No cranial nerve deficits.  Moves all extremities Skin: Warm and dry.  No rashes noted.   Data Review: I have personally reviewed the following laboratory data and studies,  CBC: Recent Labs  Lab 06/06/24 0611 06/07/24 0633 06/08/24 0531 06/09/24 0208 06/11/24 0311  WBC 8.8 7.8 12.1* 12.1* 15.2*  HGB 12.6 11.8* 12.4 11.9* 11.2*  HCT 39.6 38.1 39.4 37.4  34.5*  MCV 93.4 91.4 91.0 90.1 90.6  PLT 307 309 324 318 300   Basic Metabolic Panel: Recent Labs  Lab 06/07/24 0633 06/08/24 0531 06/09/24 0208 06/10/24 0332 06/11/24 0311  NA 139 138 144 140 139  K 3.1* 3.3* 3.5 4.1 4.1  CL 106 105 105 103 102  CO2 27 27 27 24 28   GLUCOSE 99 108* 113* 181* 123*  BUN 11 6* <5* 9 16  CREATININE 0.61 0.50 0.40* 0.43* 0.46  CALCIUM  7.7* 7.7* 8.1* 8.1* 8.3*  MG 1.8 2.0 2.0 1.9 2.1  PHOS  --  1.9* 2.3* 2.3* 2.8   Liver Function Tests: Recent Labs  Lab 06/08/24 0531 06/09/24 0208 06/10/24 0332  AST 30 24 22   ALT 21 17 15   ALKPHOS 64 74 59  BILITOT 0.3 0.3 0.3  PROT 5.0* 5.0* 5.0*  ALBUMIN  2.5* 2.9* 3.3*   No results for input(s): LIPASE, AMYLASE in the last 168 hours.  No results for input(s): AMMONIA in the last 168 hours. Cardiac Enzymes: No results for input(s): CKTOTAL, CKMB, CKMBINDEX, TROPONINI in the last 168 hours. BNP (last 3 results) No results for input(s): BNP in the last 8760 hours.  ProBNP (last 3 results) No results for input(s): PROBNP in the last 8760 hours.  CBG: Recent Labs  Lab 06/10/24 0539 06/10/24 1148 06/10/24 1801 06/10/24 2330 06/11/24 0542  GLUCAP 159* 126* 134* 140* 131*   Recent Results (from the past 240 hours)  Blood Culture (routine x 2)     Status: None   Collection Time: 06/02/24  8:35 AM   Specimen: BLOOD  Result Value Ref Range Status   Specimen Description   Final    BLOOD RIGHT ANTECUBITAL Performed at [facility], [address] [place], [place] [address]    Special Requests   Final    BOTTLES DRAWN AEROBIC AND ANAEROBIC Blood Culture adequate volume Performed at [facility], [address] [place], [place] [address]    Culture   Final    NO GROWTH 5 DAYS Performed at [facility], [address] [place], [place] [address]    Report Status 06/07/2024 FINAL  Final  Resp panel by RT-PCR (RSV, Flu A&B, Covid)  Anterior Nasal Swab     Status: None   Collection Time: 06/02/24 10:14 AM   Specimen: Anterior Nasal Swab  Result Value Ref Range Status   SARS Coronavirus 2 by RT PCR NEGATIVE NEGATIVE Final    Comment: (  NOTE) SARS-CoV-2 target nucleic acids are NOT DETECTED.  The SARS-CoV-2 RNA is generally detectable in upper respiratory specimens during the acute phase of infection. The lowest concentration of SARS-CoV-2 viral copies this assay can detect is 138 copies/mL. A negative result does not preclude SARS-Cov-2 infection and should not be used as the sole basis for treatment or other patient management decisions. A negative result may occur with  improper specimen collection/handling, submission of specimen other than nasopharyngeal swab, presence of viral mutation(s) within the areas targeted by this assay, and inadequate number of viral copies(<138 copies/mL). A negative result must be combined with clinical observations, patient history, and epidemiological information. The expected result is Negative.  Fact Sheet for Patients:  BloggerCourse.com  Fact Sheet for Healthcare Providers:  SeriousBroker.it  This test is no t yet approved or cleared by the [place]  FDA and  has been authorized for detection and/or diagnosis of SARS-CoV-2 by FDA under an Emergency Use Authorization (EUA). This EUA will remain  in effect (meaning this test can be used) for the duration of the COVID-19 declaration under Section 564(b)(1) of the Act, 21 U.S.C.section 360bbb-3(b)(1), unless the authorization is terminated  or revoked sooner.       Influenza A by PCR NEGATIVE NEGATIVE Final   Influenza B by PCR NEGATIVE NEGATIVE Final    Comment: (NOTE) The Xpert Xpress SARS-CoV-2/FLU/RSV plus assay is intended as an aid in the diagnosis of influenza from Nasopharyngeal swab specimens and should not be used as a sole basis for treatment. Nasal washings  and aspirates are unacceptable for Xpert Xpress SARS-CoV-2/FLU/RSV testing.  Fact Sheet for Patients: BloggerCourse.com  Fact Sheet for Healthcare Providers: SeriousBroker.it  This test is not yet approved or cleared by the [place]  FDA and has been authorized for detection and/or diagnosis of SARS-CoV-2 by FDA under an Emergency Use Authorization (EUA). This EUA will remain in effect (meaning this test can be used) for the duration of the COVID-19 declaration under Section 564(b)(1) of the Act, 21 U.S.C. section 360bbb-3(b)(1), unless the authorization is terminated or revoked.     Resp Syncytial Virus by PCR NEGATIVE NEGATIVE Final    Comment: (NOTE) Fact Sheet for Patients: BloggerCourse.com  Fact Sheet for Healthcare Providers: SeriousBroker.it  This test is not yet approved or cleared by the [place]  FDA and has been authorized for detection and/or diagnosis of SARS-CoV-2 by FDA under an Emergency Use Authorization (EUA). This EUA will remain in effect (meaning this test can be used) for the duration of the COVID-19 declaration under Section 564(b)(1) of the Act, 21 U.S.C. section 360bbb-3(b)(1), unless the authorization is terminated or revoked.  Performed at [facility], [address], [place], [place] [address]   Blood Culture (routine x 2)     Status: None   Collection Time: 06/02/24 12:45 PM   Specimen: BLOOD  Result Value Ref Range Status   Specimen Description   Final    BLOOD BLOOD LEFT ARM Performed at [facility], [address], [place], [place] [address]    Special Requests   Final    BOTTLES DRAWN AEROBIC ONLY Blood Culture adequate volume Performed at [facility], [address] [place], [place] [address]    Culture   Final    NO GROWTH 5 DAYS Performed at [facility], [address] [place], [place] [address]    Report Status 06/07/2024 FINAL  Final     Studies: No results found.  Vernal Alstrom, MD  [facility] 06/11/2024  If 7PM-7AM, please contact night-coverage

## 2024-06-11 NOTE — Progress Notes (Signed)
 PHARMACY - TOTAL PARENTERAL NUTRITION CONSULT NOTE   Indication: Prolonged ileus  Patient Measurements: Height: 5' 2 (157.5 cm) Weight: 61.3 kg (135 lb 2.3 oz) IBW/kg (Calculated) : 50.1 TPN AdjBW (KG): 61.3 Body mass index is 24.72 kg/m.   Assessment: Presented to ED with N/V/D and abdominal pain x 3d on 05/31/24. CT negative. Possibly from diverticulitis and placed on Cefepime /Flagyl . Concern for SBO, NGT placed. Continues to have persistent pain and distention. No significant intake since 8/21. Pharmacy consulted to start TPN on 8/26.  S/p ex lap with small bowel resection. Remains NPO with NG in place until bowel function returns.  Glucose / Insulin : No h/o DM -CBGs 126-140 within goal -6u SSI/24 hrs Electrolytes: WNL, coCa slightly low Renal: Scr <1 Hepatic: LFTs WNL; alb 3.3 Intake / Output; MIVF:  -NGT ~ 300 yesterday, 550 ml already documented this morning -UOP ok -LBM 8/24  GI Imaging: 8/19 CT: Scattered colonic diverticulosis. No active diverticulitis.  8/22, 8/23, 8/25, 8/26 Abd Xray: all SBO 8/26 CT: severely thickened and inflamed central pelvic loops of small bowel concerning for ischemia. Concern for mesenteric volvulus or closed loop obstruction. GI Surgeries / Procedures:  8/27 Ex lap with small bowel resection  Central access: PICC TPN start date: 8/26  Nutritional Goals: Goal TPN rate is 80 mL/hr (provides 86 g of protein and  1759 kcals per day)  RD Assessment: Kcal 1650-1850 Protein 75-90 g Fluid 1.8 L/day  Current Nutrition:  NPO  Plan:  -Continue TPN at goal 80 mL/hr -Electrolytes in TPN: Na 50 mEq/L, K 40 mEq/L, Ca 5 mEq/L, Mg 5 mEq/L, and Phos 15 mmol/L. Cl:Ac 1:1 -Add standard MVI and trace elements to TPN -Continue thiamine  100 mg IV x 5 days for refeeding risk -Continue Sensitive q6h SSI and adjust as needed  -Monitor TPN labs on Mon/Thurs, and PRN    Stefano MARLA Bologna, PharmD, BCPS Clinical Pharmacist 06/11/2024 10:15 AM

## 2024-06-11 NOTE — Progress Notes (Signed)
 Physical Therapy Treatment Patient Details Name: Lindsay Mason MRN: [id number] DOB: [birth year] Today's Date: 06/11/2024   History of Present Illness Pt is 73 yo female presented on 06/02/24  with + blood culture with staph species, lethargy, and worsening mental status. Pt admitted with SIRS, abdominal pain, N/V likely secondary to acute diverticulitis, and SBO vs ileus.  Pt with NG tube and surgery following. s/p exploratory lap with SB resection 2* ischemic bowel 06/09/24.   Pt also with metabolic encephalopathy on background of Alzheimer's dementia.  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 She has had a significant decline in activity tolerance. Pt required max assist for bed mobility. She was not able to tolerate sitting at edge of bed 2* pain. Pt performed bed level exercises with assist. She is oriented to self only.     If plan is discharge home, recommend the following: Two people to help with walking and/or transfers;A lot of help with bathing/dressing/bathroom;Assistance with cooking/housework;Assist for transportation;Help with stairs or ramp for entrance;Direct supervision/assist for medications management;Direct supervision/assist for financial management;Supervision due to cognitive status   Can travel by private vehicle     No  Equipment Recommendations  None recommended by PT    Recommendations for Other Services       Precautions / Restrictions Precautions Precautions: Fall;Other (comment) Recall of Precautions/Restrictions: Impaired Precaution/Restrictions Comments: NG tube, abdominal surgery, confusion Restrictions Weight Bearing Restrictions Per Provider Order: No     Mobility  Bed Mobility Overal bed  mobility: Needs Assistance Bed Mobility: Supine to Sit, Sit to Supine, Rolling, Sidelying to Sit     Supine to sit: Total assist Sit to supine: Mod assist   General bed mobility comments: attempted to roll R with max verbal/manual cues, pt physically resisted. Used bed pad to pivot pt's hips to EOB and raise trunk with max assist. At edge of bed pt had signs/symptoms of significant pain, she was not able to tolerate sitting and she initiated returning herself to supine. +2 total assist to scoot up in bed.    Transfers                        Ambulation/Gait                   Stairs             Wheelchair Mobility     Tilt Bed    Modified Rankin (Stroke Patients Only)       Balance   Sitting-balance support: Feet unsupported, Bilateral upper extremity supported Sitting balance-Leahy Scale: Poor Sitting balance - Comments: posterior lean Postural control: Posterior lean                                  Communication Communication Communication: No apparent difficulties  Cognition Arousal: Alert Behavior During Therapy: WFL for tasks assessed/performed   PT - Cognitive impairments: History of cognitive impairments, Memory, Orientation   Orientation impairments: Place, Time, Situation                   PT - Cognition Comments: pleasantly confused today; needs increased cues, oriented to self only Following commands: Impaired Following  commands impaired: Follows one step commands with increased time    Cueing Cueing Techniques: Verbal cues, Gestural cues, Tactile cues, Visual cues  Exercises General Exercises - Lower Extremity Ankle Circles/Pumps: AROM, Both, 10 reps, Supine Long Arc Quad: AROM, Both, 5 reps, Seated Heel Slides: Both, 10 reps, Supine, AAROM Hip ABduction/ADduction: AAROM, Both, 10 reps, Supine    General Comments        Pertinent Vitals/Pain Pain Assessment Faces Pain Scale: Hurts whole  lot Breathing: occasional labored breathing, short period of hyperventilation Negative Vocalization: repeated troubled calling out, loud moaning/groaning, crying Facial Expression: sad, frightened, frown Body Language: tense, distressed pacing, fidgeting Consolability: distracted or reassured by voice/touch PAINAD Score: 6 Pain Location: abdomen with movement Pain Descriptors / Indicators: Discomfort, Grimacing, Moaning, Crying, Guarding Pain Intervention(s): Limited activity within patient's tolerance, Monitored during session, Premedicated before session, Repositioned    Home Living                          Prior Function            PT Goals (current goals can now be found in the care plan section) Acute Rehab PT Goals Patient Stated Goal: to go for long walks again PT Goal Formulation: With patient/family Time For Goal Achievement: 06/17/24 Potential to Achieve Goals: Fair Progress towards PT goals: Progressing toward goals    Frequency    Min 2X/week      PT Plan      Co-evaluation              AM-PAC PT 6 Clicks Mobility   Outcome Measure  Help needed turning from your back to your side while in a flat bed without using bedrails?: Total Help needed moving from lying on your back to sitting on the side of a flat bed without using bedrails?: Total Help needed moving to and from a bed to a chair (including a wheelchair)?: Total Help needed standing up from a chair using your arms (e.g., wheelchair or bedside chair)?: Total Help needed to walk in hospital room?: Total Help needed climbing 3-5 steps with a railing? : Total 6 Click Score: 6    End of Session   Activity Tolerance: Patient limited by pain Patient left: in bed;with bed alarm set;with call bell/phone within reach;with family/visitor present;with nursing/sitter in room Nurse Communication: Mobility status PT Visit Diagnosis: Difficulty in walking, not elsewhere classified  (R26.2);Pain     Time: 8554-8497 PT Time Calculation (min) (ACUTE ONLY): 17 min  Charges:    $Therapeutic Activity: 8-22 mins PT General Charges $$ ACUTE PT VISIT: 1 Visit                     Sylvan Delon Copp PT 06/11/2024  Acute Rehabilitation Services  Office [phone]

## 2024-06-11 NOTE — Progress Notes (Signed)
 Nutrition Follow-up  INTERVENTION:   Monitor magnesium , potassium, and phosphorus daily for at least 3 days, MD to replete as needed, as pt is at risk for refeeding syndrome. -Recommend 100 mg Thiamine  daily for at least 5 days   -TPN management per Pharmacy -Daily weights while on TPN   -Monitor for diet advancement  NUTRITION DIAGNOSIS:   Inadequate oral intake related to  (PSBO) as evidenced by NPO status.  Ongoing.  GOAL:   Patient will meet greater than or equal to 90% of their needs  Meeting with TPN  MONITOR:   Labs (TPN)  ASSESSMENT:   73 y.o. 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 Currently being treated for small bowel obstruction/ileus with NG tube and IV fluids.  8/20: admitted  8/24: NGT placed for suction 8/26: TPN initiated via PICC 8/27: s/p ex lap, SB resection  Patient continues to be NPO. NGT output: ~750 ml x 24 hours.  TPN continues at goal rate of 80 ml/hr, providing 1759 kcals and 86g protein.  Admission weight: 135 lbs No other weights. Will order daily weights.  Medications: Thiamine   Labs reviewed: CBGs: 126-159  Diet Order:   Diet Order             Diet NPO time specified Except for: Ice Chips  Diet effective now                   EDUCATION NEEDS:   No education needs have been identified at this time  Skin:  Skin Assessment: Skin Integrity Issues: Skin Integrity Issues:: Incisions Incisions: 8/27 abdomen  Last BM:  8/27 -type 6  Height:   Ht Readings from Last 1 Encounters:  06/02/24 5' 2 (1.575 m)    Weight:   Wt Readings from Last 1 Encounters:  06/02/24 61.3 kg    BMI:  Body mass index is 24.72 kg/m.  Estimated  Nutritional Needs:   Kcal:  1650-1850  Protein:  75-90g  Fluid:  1.8L/day  Morna Lee, MS, RD, LDN Inpatient Clinical Dietitian Contact via Secure chat

## 2024-06-11 NOTE — Progress Notes (Signed)
 OT Cancellation Note  Patient Details Name: Lindsay Mason MRN: [id number] DOB: 14-Nov-1950   Cancelled Treatment:    Reason Eval/Treat Not Completed: Pain limiting ability to participate;Fatigue/lethargy limiting ability to participate. Pt now s/p small bowel resection 8/27. Discussed with pt - pt limited by pain and fatigue. OT will continue to follow and defer treatment at this time.  Sharyon Peitz L. Liset Mcmonigle, OTR/L  06/11/24, 4:24 PM

## 2024-06-11 NOTE — Progress Notes (Signed)
 2 Days Post-Op   Subjective/Chief Complaint: No complaints. Feels better. Still has some abd tenderness   Objective: Vital signs in last 24 hours: Temp:  [98.6 F (37 C)-99.4 F (37.4 C)] 99.4 F (37.4 C) (08/29 0544) Pulse Rate:  [92-95] 92 (08/29 0544) Resp:  [16-22] 16 (08/29 0544) BP: (140-156)/(92-100) 156/100 (08/29 0544) SpO2:  [95 %-100 %] 98 % (08/29 0544) Last BM Date : 06/06/24  Intake/Output from previous day: 08/28 0701 - 08/29 0700 In: 2115.8 [I.V.:1243.9; IV Piggyback:871.9] Out: 1425 [Urine:675; Emesis/NG output:750] Intake/Output this shift: No intake/output data recorded.  General appearance: alert and cooperative Resp: clear to auscultation bilaterally Cardio: regular rate and rhythm GI: soft, appropriately tender  Lab Results:  Recent Labs    06/09/24 0208 06/11/24 0311  WBC 12.1* 15.2*  HGB 11.9* 11.2*  HCT 37.4 34.5*  PLT 318 300   BMET Recent Labs    06/10/24 0332 06/11/24 0311  NA 140 139  K 4.1 4.1  CL 103 102  CO2 24 28  GLUCOSE 181* 123*  BUN 9 16  CREATININE 0.43* 0.46  CALCIUM  8.1* 8.3*   PT/INR No results for input(s): LABPROT, INR in the last 72 hours. ABG No results for input(s): PHART, HCO3 in the last 72 hours.  Invalid input(s): PCO2, PO2  Studies/Results: No results found.  Anti-infectives: Anti-infectives (From admission, onward)    Start     Dose/Rate Route Frequency Ordered Stop   06/10/24 0300  ceFEPIme  (MAXIPIME ) 2 g in sodium chloride  0.9 % 100 mL IVPB        2 g 200 mL/hr over 30 Minutes Intravenous Every 12 hours 06/09/24 1732     06/09/24 2100  metroNIDAZOLE  (FLAGYL ) IVPB 500 mg        500 mg 100 mL/hr over 60 Minutes Intravenous Every 12 hours 06/09/24 1725 06/16/24 2059   06/09/24 1730  ceFEPIme  (MAXIPIME ) 1 g in sodium chloride  0.9 % 100 mL IVPB  Status:  Discontinued        1 g 200 mL/hr over 30 Minutes Intravenous Every 12 hours 06/09/24 1725 06/09/24 1731   06/09/24 1500   ceFEPIme  (MAXIPIME ) 2 g in sodium chloride  0.9 % 100 mL IVPB        2 g 200 mL/hr over 30 Minutes Intravenous  Once 06/09/24 1454 06/09/24 1743   06/03/24 1130  vancomycin  (VANCOCIN ) IVPB 1000 mg/200 mL premix  Status:  Discontinued        1,000 mg 200 mL/hr over 60 Minutes Intravenous Every 24 hours 06/02/24 1149 06/03/24 0949   06/03/24 0000  ceFEPIme  (MAXIPIME ) 2 g in sodium chloride  0.9 % 100 mL IVPB        2 g 200 mL/hr over 30 Minutes Intravenous Every 12 hours 06/02/24 1149 06/08/24 1553   06/02/24 2200  metroNIDAZOLE  (FLAGYL ) IVPB 500 mg  Status:  Discontinued        500 mg 100 mL/hr over 60 Minutes Intravenous Every 12 hours 06/02/24 1113 06/09/24 1730   06/02/24 1100  ceFEPIme  (MAXIPIME ) 2 g in sodium chloride  0.9 % 100 mL IVPB        2 g 200 mL/hr over 30 Minutes Intravenous  Once 06/02/24 1045 06/02/24 1449   06/02/24 1100  vancomycin  (VANCOCIN ) IVPB 1000 mg/200 mL premix        1,000 mg 200 mL/hr over 60 Minutes Intravenous  Once 06/02/24 1045 06/02/24 1233   06/02/24 1100  metroNIDAZOLE  (FLAGYL ) IVPB 500 mg  500 mg 100 mL/hr over 60 Minutes Intravenous  Once 06/02/24 1045 06/02/24 1818       Assessment/Plan: s/p Procedure(s) with comments: LAPAROSCOPY, DIAGNOSTIC, EXPLORATORY LAPAROTOMY WITH SMALL BOWEL RESECTION (N/A) - Possible laparotomy Continue ng until bowel function returns Continue tpn for nutrition support Continue IV abx Dementia Continue dressing changes  LOS: 9 days    [name] 06/11/2024

## 2024-06-12 DIAGNOSIS — E785 Hyperlipidemia, unspecified: Secondary | ICD-10-CM | POA: Diagnosis not present

## 2024-06-12 DIAGNOSIS — I739 Peripheral vascular disease, unspecified: Secondary | ICD-10-CM | POA: Diagnosis not present

## 2024-06-12 DIAGNOSIS — R6510 Systemic inflammatory response syndrome (SIRS) of non-infectious origin without acute organ dysfunction: Secondary | ICD-10-CM | POA: Diagnosis not present

## 2024-06-12 DIAGNOSIS — J449 Chronic obstructive pulmonary disease, unspecified: Secondary | ICD-10-CM | POA: Diagnosis not present

## 2024-06-12 LAB — GLUCOSE, CAPILLARY
Glucose-Capillary: 123 mg/dL — ABNORMAL HIGH (ref 70–99)
Glucose-Capillary: 128 mg/dL — ABNORMAL HIGH (ref 70–99)
Glucose-Capillary: 135 mg/dL — ABNORMAL HIGH (ref 70–99)
Glucose-Capillary: 135 mg/dL — ABNORMAL HIGH (ref 70–99)

## 2024-06-12 MED ORDER — TRAVASOL 10 % IV SOLN
INTRAVENOUS | Status: AC
  Filled 2024-06-12: qty 864

## 2024-06-12 NOTE — Progress Notes (Signed)
 Physical Therapy Treatment Patient Details Name: Lindsay Mason MRN: [id number] DOB: [birth year] Today's Date: 06/12/2024   History of Present Illness Pt is 73 yo female presented on 06/02/24  with + blood culture with staph species, lethargy, and worsening mental status. Pt admitted with SIRS, abdominal pain, N/V likely secondary to acute diverticulitis, and SBO vs ileus.  Pt with NG tube and surgery following. s/p exploratory lap with SB resection 2* ischemic bowel 06/09/24.   Pt also with metabolic encephalopathy on background of Alzheimer's dementia.  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  Pt with hx of dementia and more resistant to assist/ movement with abdominal pain however daughter present today and assisted with calming and encouraging pt.  +2 required for assist and management of lines/leads/tubes to keep pt from pulling.  Mittens reapplied and safety sitter present end of session.     If plan is discharge home, recommend the following: Two people to help with walking and/or transfers;A lot of help with bathing/dressing/bathroom;Assistance with cooking/housework;Assist for transportation;Help with stairs or ramp for entrance;Direct supervision/assist for medications management;Direct supervision/assist for financial management;Supervision due to cognitive status   Can travel by private vehicle        Equipment Recommendations  None recommended by PT    Recommendations for Other Services       Precautions / Restrictions Precautions Precautions: Fall;Other (comment) Recall of Precautions/Restrictions: Impaired Precaution/Restrictions Comments: NG  tube, abdominal surgery, hx dementia     Mobility  Bed Mobility Overal bed mobility: Needs Assistance Bed Mobility: Rolling, Sidelying to Sit Rolling: +2 for physical assistance, Total assist Sidelying to sit: Total assist, +2 for physical assistance       General bed mobility comments: multimodal cues provided, required increased time due to pain and pt stating wait wait wait; pt would initiate sometimes but then stopped with pain; required total +2 assist    Transfers Overall transfer level: Needs assistance Equipment used: 2 person hand held assist Transfers: Sit to/from Stand Sit to Stand: Min assist, +2 safety/equipment           General transfer comment: multimodal cues for technique; continued assist for line management and occupying hands (so pt doesn't pull at lines/tubes)    Ambulation/Gait Ambulation/Gait assistance: Min assist, +2 safety/equipment Gait Distance (Feet): 30 Feet Assistive device: 2 person hand held assist Gait Pattern/deviations: Step-through pattern, Decreased stride length Gait velocity: decreased     General Gait Details: assist for stabilizing, daughter assisted with encouraging distance as tolerated, recliner followed for safety   Stairs             Wheelchair Mobility     Tilt Bed    Modified Rankin (Stroke Patients Only)       Balance                                            Communication    Cognition Arousal: Alert Behavior During Therapy: WFL for tasks assessed/performed   PT - Cognitive impairments: History of cognitive impairments  PT - Cognition Comments: pt more resistant to mobilize with abdominal pain however daughter present and assisted with encouragement Following commands: Impaired Following commands impaired: Follows one step commands with increased time, Follows one step commands inconsistently    Cueing Cueing Techniques: Verbal cues, Tactile  cues, Visual cues, Gestural cues  Exercises      General Comments        Pertinent Vitals/Pain Pain Assessment Pain Assessment: Faces Faces Pain Scale: Hurts even more Pain Location: abdomen with movement Pain Descriptors / Indicators: Guarding, Grimacing, Tender, Sore, Moaning Pain Intervention(s): Repositioned, Monitored during session    Home Living                          Prior Function            PT Goals (current goals can now be found in the care plan section) Progress towards PT goals: Progressing toward goals    Frequency    Min 2X/week      PT Plan      Co-evaluation              AM-PAC PT 6 Clicks Mobility   Outcome Measure  Help needed turning from your back to your side while in a flat bed without using bedrails?: Total Help needed moving from lying on your back to sitting on the side of a flat bed without using bedrails?: Total Help needed moving to and from a bed to a chair (including a wheelchair)?: Total Help needed standing up from a chair using your arms (e.g., wheelchair or bedside chair)?: Total Help needed to walk in hospital room?: Total Help needed climbing 3-5 steps with a railing? : Total 6 Click Score: 6    End of Session Equipment Utilized During Treatment: Gait belt Activity Tolerance: Patient limited by pain Patient left: with call bell/phone within reach;in chair;with chair alarm set;with nursing/sitter in room;with family/visitor present Nurse Communication: Mobility status PT Visit Diagnosis: Difficulty in walking, not elsewhere classified (R26.2)     Time: 8946-8880 PT Time Calculation (min) (ACUTE ONLY): 26 min  Charges:    $Gait Training: 23-37 mins PT General Charges $$ ACUTE PT VISIT: 1 Visit                    Tari KLEIN, DPT Physical Therapist Acute Rehabilitation Services Office: [phone]    Tari CROME Payson 06/12/2024, 1:56 PM

## 2024-06-12 NOTE — Progress Notes (Signed)
 PHARMACY - TOTAL PARENTERAL NUTRITION CONSULT NOTE   Indication: Prolonged ileus  Patient Measurements: Height: 5' 2 (157.5 cm) Weight: 66.4 kg (146 lb 6.2 oz) IBW/kg (Calculated) : 50.1 TPN AdjBW (KG): 61.3 Body mass index is 26.77 kg/m.   Assessment: Presented to ED with N/V/D and abdominal pain x 3d on 05/31/24. CT negative. Possibly from diverticulitis and placed on Cefepime /Flagyl . Concern for SBO, NGT placed. Continues to have persistent pain and distention. No significant intake since 8/21. Pharmacy consulted to start TPN on 8/26.  S/p ex lap with small bowel resection. Remains NPO with NG in place until bowel function returns.  Glucose / Insulin : No h/o DM -CBGs 115-130, within goal -1 unit SSI/24 hrs Electrolytes: 8/29 labs: WNL, CoCa slightly low Renal: 8/29 labs: Scr <1, BUN stable Hepatic: 8/28 labs: LFTs WNL; alb 3.3 (slightly low) Intake / Output; MIVF:  -NGT decreased to out in last 24hrs -UOP 1.5L in last 24hrs -LBM 8/27  GI Imaging: 8/19 CT: Scattered colonic diverticulosis. No active diverticulitis.  8/22, 8/23, 8/25, 8/26 Abd Xray: all SBO 8/26 CT: severely thickened and inflamed central pelvic loops of small bowel concerning for ischemia. Concern for mesenteric volvulus or closed loop obstruction. GI Surgeries / Procedures:  8/27 Ex lap with small bowel resection  Central access: PICC TPN start date: 8/26  Nutritional Goals: Goal TPN rate is 80 mL/hr (provides 86 g of protein and  1759 kcals per day)  RD Assessment: Kcal 1650-1850 Protein 75-90 g Fluid 1.8 L/day  Current Nutrition:  Clear liquids and TPN  Plan:  -Continue TPN at goal 80 mL/hr -Electrolytes in TPN:  Na 50 mEq/L K 40 mEq/L Ca 5 mEq/L Mg 5 mEq/L Phos 15 mmol/L Cl:Ac 1:1 -Add standard MVI and trace elements to TPN -Continue thiamine  100 mg IV x 5 days for refeeding risk -Continue Sensitive q6h SSI and adjust as needed  -Monitor TPN labs on Mon/Thurs, and PRN--will f/u  labs in the AM -Per surgery, NG clamp trial and CLD today. Possibly remove NG tube tomorrow if patient tolerates these changes.    Lacinda Moats, PharmD Clinical Pharmacist  8/30/202510:08 AM

## 2024-06-12 NOTE — Progress Notes (Signed)
 3 Days Post-Op   Subjective/Chief Complaint: The NG tube only put out 300 cc over the last 24 hours The patient is unable to tell whether she has had flatus.   Objective: Vital signs in last 24 hours: Temp:  [98.6 F (37 C)] 98.6 F (37 C) (08/30 0655) Pulse Rate:  [99] 99 (08/30 0655) Resp:  [16] 16 (08/30 0655) BP: (144)/(103) 144/103 (08/30 0655) SpO2:  [96 %] 96 % (08/30 0655) Weight:  [66.4 kg] 66.4 kg (08/30 0655) Last BM Date : 06/06/24  Intake/Output from previous day: 08/29 0701 - 08/30 0700 In: -  Out: 1800 [Urine:1500; Emesis/NG output:300] Intake/Output this shift: No intake/output data recorded.  She appears well on exam but is restrained. She does follow commands. Her abdomen is mildly full but minimally tender  Lab Results:  Recent Labs    06/11/24 0311  WBC 15.2*  HGB 11.2*  HCT 34.5*  PLT 300   BMET Recent Labs    06/10/24 0332 06/11/24 0311  NA 140 139  K 4.1 4.1  CL 103 102  CO2 24 28  GLUCOSE 181* 123*  BUN 9 16  CREATININE 0.43* 0.46  CALCIUM  8.1* 8.3*   PT/INR No results for input(s): LABPROT, INR in the last 72 hours. ABG No results for input(s): PHART, HCO3 in the last 72 hours.  Invalid input(s): PCO2, PO2  Studies/Results: No results found.  Anti-infectives: Anti-infectives (From admission, onward)    Start     Dose/Rate Route Frequency Ordered Stop   06/10/24 0300  ceFEPIme  (MAXIPIME ) 2 g in sodium chloride  0.9 % 100 mL IVPB        2 g 200 mL/hr over 30 Minutes Intravenous Every 12 hours 06/09/24 1732 06/17/24 0259   06/09/24 2100  metroNIDAZOLE  (FLAGYL ) IVPB 500 mg        500 mg 100 mL/hr over 60 Minutes Intravenous Every 12 hours 06/09/24 1725 06/16/24 2059   06/09/24 1730  ceFEPIme  (MAXIPIME ) 1 g in sodium chloride  0.9 % 100 mL IVPB  Status:  Discontinued        1 g 200 mL/hr over 30 Minutes Intravenous Every 12 hours 06/09/24 1725 06/09/24 1731   06/09/24 1500  ceFEPIme  (MAXIPIME ) 2 g in sodium  chloride 0.9 % 100 mL IVPB        2 g 200 mL/hr over 30 Minutes Intravenous  Once 06/09/24 1454 06/09/24 1743   06/03/24 1130  vancomycin  (VANCOCIN ) IVPB 1000 mg/200 mL premix  Status:  Discontinued        1,000 mg 200 mL/hr over 60 Minutes Intravenous Every 24 hours 06/02/24 1149 06/03/24 0949   06/03/24 0000  ceFEPIme  (MAXIPIME ) 2 g in sodium chloride  0.9 % 100 mL IVPB        2 g 200 mL/hr over 30 Minutes Intravenous Every 12 hours 06/02/24 1149 06/08/24 1553   06/02/24 2200  metroNIDAZOLE  (FLAGYL ) IVPB 500 mg  Status:  Discontinued        500 mg 100 mL/hr over 60 Minutes Intravenous Every 12 hours 06/02/24 1113 06/09/24 1730   06/02/24 1100  ceFEPIme  (MAXIPIME ) 2 g in sodium chloride  0.9 % 100 mL IVPB        2 g 200 mL/hr over 30 Minutes Intravenous  Once 06/02/24 1045 06/02/24 1449   06/02/24 1100  vancomycin  (VANCOCIN ) IVPB 1000 mg/200 mL premix        1,000 mg 200 mL/hr over 60 Minutes Intravenous  Once 06/02/24 1045 06/02/24 1233   06/02/24 1100  metroNIDAZOLE  (FLAGYL ) IVPB 500 mg        500 mg 100 mL/hr over 60 Minutes Intravenous  Once 06/02/24 1045 06/02/24 1818       Assessment/Plan: s/p Procedure(s) with comments: LAPAROSCOPY, DIAGNOSTIC, EXPLORATORY LAPAROTOMY WITH SMALL BOWEL RESECTION (N/A) - Possible laparotomy  Given decrease in NG output, we will clamp NG tube and let her try liquids.  I discussed this with the patient's family in the room and by phone. If she tolerates this today, we may be able to remove the NG tube tomorrow.  LOS: 10 days    Lindsay Mason 06/12/2024

## 2024-06-12 NOTE — Progress Notes (Signed)
 Physical Therapy Treatment Patient Details Name: Lindsay Mason MRN: [id number] DOB: 1951-06-16 Today's Date: 06/12/2024   History of Present Illness Pt is 73 yo female presented on 06/02/24  with + blood culture with staph species, lethargy, and worsening mental status. Pt admitted with SIRS, abdominal pain, N/V likely secondary to acute diverticulitis, and SBO vs ileus.  Pt with NG tube and surgery following. s/p exploratory lap with SB resection 2* ischemic bowel 06/09/24.   Pt also with metabolic encephalopathy on background of Alzheimer's dementia.  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  Pt assisted with transfer from recliner back to bed.  Pt mostly requiring assist due to reluctance to mobilize (painful and hx dementia).  Assist provided for line management and safely returning pt back to bed.    If plan is discharge home, recommend the following: Two people to help with walking and/or transfers;A lot of help with bathing/dressing/bathroom;Assistance with cooking/housework;Assist for transportation;Help with stairs or ramp for entrance;Direct supervision/assist for medications management;Direct supervision/assist for financial management;Supervision due to cognitive status   Can travel by private vehicle        Equipment Recommendations  None recommended by PT    Recommendations for Other Services       Precautions / Restrictions Precautions Precautions: Fall;Other (comment) Recall of Precautions/Restrictions: Impaired Precaution/Restrictions Comments: NG tube, abdominal surgery, hx dementia     Mobility  Bed Mobility Overal bed mobility: Needs Assistance Bed Mobility: Rolling, Sit to Sidelying Rolling: +2  for physical assistance, Total assist Sidelying to sit: Total assist, +2 for physical assistance     Sit to sidelying: +2 for physical assistance, Total assist General bed mobility comments: multimodal cues provided, required total +2 assist due reluctance to return to bed and pain    Transfers Overall transfer level: Needs assistance Equipment used: 2 person hand held assist Transfers: Sit to/from Stand Sit to Stand: Min assist, +2 safety/equipment           General transfer comment: multimodal cues for technique; continued assist for line management and occupying hands (so pt doesn't pull at lines/tubes)    Ambulation/Gait Ambulation/Gait assistance: Min assist, +2 safety/equipment Gait Distance (Feet): 30 Feet Assistive device: 2 person hand held assist Gait Pattern/deviations: Step-through pattern, Decreased stride length Gait velocity: decreased     General Gait Details: assist for stabilizing, daughter assisted with encouraging distance as tolerated, recliner followed for safety   Stairs             Wheelchair Mobility     Tilt Bed    Modified Rankin (Stroke Patients Only)       Balance                                            Communication    Cognition Arousal: Alert Behavior During Therapy: WFL for tasks assessed/performed   PT - Cognitive impairments: History of cognitive impairments                       PT - Cognition Comments: pt more resistant to mobilize with abdominal pain however daughter present and assisted with encouragement Following  commands: Impaired Following commands impaired: Follows one step commands with increased time, Follows one step commands inconsistently    Cueing Cueing Techniques: Verbal cues, Tactile cues, Visual cues, Gestural cues  Exercises      General Comments        Pertinent Vitals/Pain Pain Assessment Pain Assessment: Faces Faces Pain Scale: Hurts whole lot Pain  Location: abdomen with movement Pain Descriptors / Indicators: Guarding, Grimacing, Tender, Sore, Moaning Pain Intervention(s): Monitored during session, Repositioned    Home Living                          Prior Function            PT Goals (current goals can now be found in the care plan section) Acute Rehab PT Goals PT Goal Formulation: With patient/family Time For Goal Achievement: 06/26/24 Potential to Achieve Goals: Fair Progress towards PT goals: Not progressing toward goals - comment (cognition/pain limiting)    Frequency    Min 2X/week      PT Plan      Co-evaluation              AM-PAC PT 6 Clicks Mobility   Outcome Measure  Help needed turning from your back to your side while in a flat bed without using bedrails?: Total Help needed moving from lying on your back to sitting on the side of a flat bed without using bedrails?: Total Help needed moving to and from a bed to a chair (including a wheelchair)?: Total Help needed standing up from a chair using your arms (e.g., wheelchair or bedside chair)?: Total Help needed to walk in hospital room?: Total Help needed climbing 3-5 steps with a railing? : Total 6 Click Score: 6    End of Session Equipment Utilized During Treatment: Gait belt Activity Tolerance: Patient limited by pain Patient left: with call bell/phone within reach;with nursing/sitter in room;with family/visitor present;in bed;with bed alarm set Nurse Communication: Mobility status PT Visit Diagnosis: Difficulty in walking, not elsewhere classified (R26.2)     Time: 8799-8788 PT Time Calculation (min) (ACUTE ONLY): 11 min  Charges:     $Therapeutic Activity: 8-22 mins PT General Charges $$ ACUTE PT VISIT: 1 Visit                     Tari KLEIN, DPT Physical Therapist Acute Rehabilitation Services Office: [phone]    Tari CROME Payson 06/12/2024, 5:18 PM

## 2024-06-12 NOTE — Plan of Care (Signed)
   Problem: Coping: Goal: Ability to adjust to condition or change in health will improve Outcome: Progressing   Problem: Metabolic: Goal: Ability to maintain appropriate glucose levels will improve Outcome: Progressing   Problem: Nutritional: Goal: Maintenance of adequate nutrition will improve Outcome: Progressing

## 2024-06-12 NOTE — Progress Notes (Signed)
 PROGRESS NOTE  [name] FMW:[id number] DOB: Feb 20, 1951 DOA: 06/02/2024 PCP: [name], MD   LOS: 10 days   Brief narrative:  Lindsay Mason is a 73 y.o. 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  Patient had lethargy and worsening mental status.  In the ED, patient had slightly low blood pressure.  Labs were notable for WBC elevated at 13.7.  CT abdomen/pelvis done on 06/01/2024 with no acute findings but scattered colonic diverticulosis with small diverticulitis.  Patient was then considered for admission to the hospital for further evaluation and treatment. During hospitalization, patient continued to have abdominal distention and pain so general surgery was consulted.  Currently, being treated for small bowel obstruction/ileus initially conservatively but since that did not improve and had significant CT findings, underwent exploratory laparotomy with small bowel resection on 06/09/2024.  Patient is currently on TPN and awaiting for clinical improvement.  Assessment/Plan: Principal Problem:   SIRS (systemic inflammatory response syndrome) (HCC) Active Problems:   COPD with emphysema (HCC)   Thoracic aortic atherosclerosis (HCC)   History of essential hypertension   Severe early onset Alzheimer dementia (HCC)   PAD (peripheral artery disease) (HCC)   Confusion and disorientation   Positive blood culture   Nausea and vomiting   Abdominal pain   Hyperlipidemia   Abnormal EKG   Abdominal pain/  Nausea and vomiting  Sepsis likely secondary to possible acute diverticulitis Small bowel obstruction/ileus Patient initially had leukocytosis with hypotension and lethargy and clinical suspicion for  diverticulitis suggestive of possible sepsis.  Initial CT scan of the abdomen initially was negative for acute findings but had clinical symptoms of diverticulitis.  Patient was continued on cefepime  and metronidazole .  Blood cultures negative in  5 days.  General surgery was consulted due to persistent abdominal distention and pain.  Initial imaging with x-ray of the abdomen showed possible SBO/ileus.  Patient was then started on conservative treatment with NG tube IV fluid with electrolyte replacement but continued to have pain and distention with NG output.  Repeat CT scan done 06/08/2024 showed significant thickening of the pelvic loops of the small bowel concerning for ischemia.  Patient on underwent diagnostic laparoscopy, exploratory laparotomy with small bowel resection on 06/09/2024.  Currently NG tube, IV fluids and TPN.  Surgery has plans to clamp NG tube today and see if she can tolerate clears.  Hypophosphatemia.  Improved after replacement.  Latest phosphorus of 2.8.  Hypokalemia.  Improved after repletion.  Latest potassium of 4.1.  Metabolic encephalopathy on the background of Alzheimer's dementia.   Likely secondary to infection.  On nortriptyline  trazodone  Exelon  at home..  On mittens at this time.  Family at bedside.  Has required one-to-one sitter at times.     Positive blood culture with staph species. Contamination suspected.     COPD with emphysema  Continue albuterol  inhaler.  Appears compensated.  On 2 L of nasal cannula oxygen at this time.     Hyperlipidemia   PAD (peripheral artery disease)    Thoracic aortic atherosclerosis On Lipitor at home, we will hold off for now.     History of essential hypertension Slightly elevated.  IV hydralazine  as needed.  Weakness, deconditioning.  Seen by PT OT and recommended skilled nursing facility placement at this time.  DVT prophylaxis: enoxaparin  (LOVENOX ) injection 40 mg Start: 06/10/24 1400 SCDs Start: 06/02/24  1114   Disposition: Skilled therapy has seen the patient and revised plans for skilled nursing facility placement.  Status is: Inpatient Remains inpatient appropriate because: Pending clinical improvement, status post exploratory laparotomy, TPN,   Code Status:     Code Status: Full Code  Family Communication: Spoke with the patient's daughter-in-law at bedside.  Consultants: General Surgery  Procedures: NG tube placement Exploratory laparotomy with small bowel resection on 06/09/2024  Anti-infectives:  Cefepime  and Flagyl  IV  Anti-infectives (From admission, onward)    Start     Dose/Rate Route Frequency Ordered Stop   06/10/24 0300  ceFEPIme  (MAXIPIME ) 2 g in sodium chloride  0.9 % 100 mL IVPB        2 g 200 mL/hr over 30 Minutes Intravenous Every 12 hours 06/09/24 1732 06/17/24 0259   06/09/24 2100  metroNIDAZOLE  (FLAGYL ) IVPB 500 mg        500 mg 100 mL/hr over 60 Minutes Intravenous Every 12 hours 06/09/24 1725 06/16/24 2059   06/09/24 1730  ceFEPIme  (MAXIPIME ) 1 g in sodium chloride  0.9 % 100 mL IVPB  Status:  Discontinued        1 g 200 mL/hr over 30 Minutes Intravenous Every 12 hours 06/09/24 1725 06/09/24 1731   06/09/24 1500  ceFEPIme  (MAXIPIME ) 2 g in sodium chloride  0.9 % 100 mL IVPB        2 g 200 mL/hr over 30 Minutes Intravenous  Once 06/09/24 1454 06/09/24 1743   06/03/24 1130  vancomycin  (VANCOCIN ) IVPB 1000 mg/200 mL premix  Status:  Discontinued        1,000 mg 200 mL/hr over 60 Minutes Intravenous Every 24 hours 06/02/24 1149 06/03/24 0949   06/03/24 0000  ceFEPIme  (MAXIPIME ) 2 g in sodium chloride  0.9 % 100 mL IVPB        2 g 200 mL/hr over 30 Minutes Intravenous Every 12 hours 06/02/24 1149 06/08/24 1553   06/02/24 2200  metroNIDAZOLE  (FLAGYL ) IVPB 500 mg  Status:  Discontinued        500 mg 100 mL/hr over 60 Minutes Intravenous Every 12 hours 06/02/24 1113 06/09/24 1730   06/02/24 1100  ceFEPIme  (MAXIPIME ) 2 g in sodium chloride  0.9 % 100 mL IVPB         2 g 200 mL/hr over 30 Minutes Intravenous  Once 06/02/24 1045 06/02/24 1449   06/02/24 1100  vancomycin  (VANCOCIN ) IVPB 1000 mg/200 mL premix        1,000 mg 200 mL/hr over 60 Minutes Intravenous  Once 06/02/24 1045 06/02/24 1233   06/02/24 1100  metroNIDAZOLE  (FLAGYL ) IVPB 500 mg        500 mg 100 mL/hr over 60 Minutes Intravenous  Once 06/02/24 1045 06/02/24 1818       Subjective: Today, patient was seen and examined at bedside.  Patient's daughter-in-law at bedside.  Patient alert awake and denies any complaints but is poor historian from dementia.  Objective: Vitals:   06/11/24 0544 06/12/24 0655  BP: (!) 156/100 (!) 144/103  Pulse: 92 99  Resp: 16 16  Temp: 99.4 F (37.4 C) 98.6 F (37 C)  SpO2: 98% 96%    Intake/Output Summary (Last 24 hours) at 06/12/2024 1110 Last data filed at 06/12/2024 0600 Gross per 24 hour  Intake --  Output 1000 ml  Net -1000 ml   Filed Weights   06/02/24 0812 06/12/24 0655  Weight: 61.3 kg 66.4 kg  Body mass index is 26.77 kg/m.   Physical Exam:  General:  Average built, not in obvious distress, NG tube in place, appears chronically ill deconditioned and weak, Communicative, HENT:   No scleral pallor or icterus noted. Oral mucosa is moist.  Chest:   Diminished breath sounds bilaterally.  CVS: S1 &S2 heard. No murmur.  Regular rate and rhythm. Abdomen: Soft, mild tenderness noted, mild distention of.  Bowel sounds are heard.  Abdominal dressing in place. Extremities: No cyanosis, clubbing or edema.  Peripheral pulses are palpable. Psych: Alert, awake and oriented to self, cognitive dysfunction CNS:  No cranial nerve deficits.  Generalized weakness noted.  Moves all extremities, Skin: Warm and dry.  No rashes noted.  Data Review: I have personally reviewed the following laboratory data and studies,  CBC: Recent Labs  Lab 06/06/24 0611 06/07/24 0633 06/08/24 0531 06/09/24 0208 06/11/24 0311  WBC 8.8 7.8 12.1* 12.1*  15.2*  HGB 12.6 11.8* 12.4 11.9* 11.2*  HCT 39.6 38.1 39.4 37.4 34.5*  MCV 93.4 91.4 91.0 90.1 90.6  PLT 307 309 324 318 300   Basic Metabolic Panel: Recent Labs  Lab 06/07/24 0633 06/08/24 0531 06/09/24 0208 06/10/24 0332 06/11/24 0311  NA 139 138 144 140 139  K 3.1* 3.3* 3.5 4.1 4.1  CL 106 105 105 103 102  CO2 27 27 27 24 28   GLUCOSE 99 108* 113* 181* 123*  BUN 11 6* <5* 9 16  CREATININE 0.61 0.50 0.40* 0.43* 0.46  CALCIUM  7.7* 7.7* 8.1* 8.1* 8.3*  MG 1.8 2.0 2.0 1.9 2.1  PHOS  --  1.9* 2.3* 2.3* 2.8   Liver Function Tests: Recent Labs  Lab 06/08/24 0531 06/09/24 0208 06/10/24 0332  AST 30 24 22   ALT 21 17 15   ALKPHOS 64 74 59  BILITOT 0.3 0.3 0.3  PROT 5.0* 5.0* 5.0*  ALBUMIN  2.5* 2.9* 3.3*   No results for input(s): LIPASE, AMYLASE in the last 168 hours.  No results for input(s): AMMONIA in the last 168 hours. Cardiac Enzymes: No results for input(s): CKTOTAL, CKMB, CKMBINDEX, TROPONINI in the last 168 hours. BNP (last 3 results) No results for input(s): BNP in the last 8760 hours.  ProBNP (last 3 results) No results for input(s): PROBNP in the last 8760 hours.  CBG: Recent Labs  Lab 06/10/24 2330 06/11/24 0542 06/11/24 1702 06/11/24 2346 06/12/24 0624  GLUCAP 140* 131* 115* 130* 128*   Recent Results (from the past 240 hours)  Blood Culture (routine x 2)     Status: None   Collection Time: 06/02/24 12:45 PM   Specimen: BLOOD  Result Value Ref Range Status   Specimen Description   Final    BLOOD BLOOD LEFT ARM Performed at [facility], [address] [place], [place] [address]    Special Requests   Final    BOTTLES DRAWN AEROBIC ONLY Blood Culture adequate volume Performed at [facility], [address] [place], [place] [address]    Culture   Final    NO GROWTH 5 DAYS Performed at [facility], [address] [place], [place] [address]    Report Status 06/07/2024 FINAL   Final     Studies: No results found.     Shandrell Boda, MD  [facility] 06/12/2024  If 7PM-7AM, please contact night-coverage

## 2024-06-13 DIAGNOSIS — I739 Peripheral vascular disease, unspecified: Secondary | ICD-10-CM | POA: Diagnosis not present

## 2024-06-13 DIAGNOSIS — E785 Hyperlipidemia, unspecified: Secondary | ICD-10-CM | POA: Diagnosis not present

## 2024-06-13 DIAGNOSIS — R6510 Systemic inflammatory response syndrome (SIRS) of non-infectious origin without acute organ dysfunction: Secondary | ICD-10-CM | POA: Diagnosis not present

## 2024-06-13 DIAGNOSIS — J449 Chronic obstructive pulmonary disease, unspecified: Secondary | ICD-10-CM | POA: Diagnosis not present

## 2024-06-13 LAB — CBC
HCT: 30.9 % — ABNORMAL LOW (ref 36.0–46.0)
Hemoglobin: 10 g/dL — ABNORMAL LOW (ref 12.0–15.0)
MCH: 29.2 pg (ref 26.0–34.0)
MCHC: 32.4 g/dL (ref 30.0–36.0)
MCV: 90.1 fL (ref 80.0–100.0)
Platelets: 297 K/uL (ref 150–400)
RBC: 3.43 MIL/uL — ABNORMAL LOW (ref 3.87–5.11)
RDW: 14.5 % (ref 11.5–15.5)
WBC: 11.8 K/uL — ABNORMAL HIGH (ref 4.0–10.5)
nRBC: 0 % (ref 0.0–0.2)

## 2024-06-13 LAB — BASIC METABOLIC PANEL WITH GFR
Anion gap: 9 (ref 5–15)
BUN: 26 mg/dL — ABNORMAL HIGH (ref 8–23)
CO2: 28 mmol/L (ref 22–32)
Calcium: 8.4 mg/dL — ABNORMAL LOW (ref 8.9–10.3)
Chloride: 104 mmol/L (ref 98–111)
Creatinine, Ser: 0.39 mg/dL — ABNORMAL LOW (ref 0.44–1.00)
GFR, Estimated: >60 mL/min (ref >60)
Glucose, Bld: 110 mg/dL — ABNORMAL HIGH (ref 70–99)
Potassium: 4.1 mmol/L (ref 3.5–5.1)
Sodium: 141 mmol/L (ref 135–145)

## 2024-06-13 LAB — GLUCOSE, CAPILLARY
Glucose-Capillary: 113 mg/dL — ABNORMAL HIGH (ref 70–99)
Glucose-Capillary: 116 mg/dL — ABNORMAL HIGH (ref 70–99)
Glucose-Capillary: 120 mg/dL — ABNORMAL HIGH (ref 70–99)
Glucose-Capillary: 127 mg/dL — ABNORMAL HIGH (ref 70–99)

## 2024-06-13 LAB — PHOSPHORUS: Phosphorus: 3.2 mg/dL (ref 2.5–4.6)

## 2024-06-13 LAB — MAGNESIUM: Magnesium: 2.2 mg/dL (ref 1.7–2.4)

## 2024-06-13 MED ORDER — BOOST / RESOURCE BREEZE PO LIQD CUSTOM: 1.0000 | Freq: Three times a day (TID) | ORAL | Status: DC

## 2024-06-13 MED ORDER — BOOST / RESOURCE BREEZE PO LIQD CUSTOM
1.0000 | Freq: Three times a day (TID) | ORAL | Status: DC
  Administered 2024-06-13 – 2024-06-20 (×13): 1 via ORAL
  Administered 2024-06-20: 237 mL via ORAL
  Administered 2024-06-21 – 2024-06-25 (×7): 1 via ORAL

## 2024-06-13 MED ORDER — TRAVASOL 10 % IV SOLN
INTRAVENOUS | Status: AC
  Filled 2024-06-13: qty 864

## 2024-06-13 NOTE — Progress Notes (Signed)
 4 Days Post-Op   Subjective/Chief Complaint: NG came out overnight. Patient still requiring restraints. She denies nausea or abdominal pain this morning   Objective: Vital signs in last 24 hours: Temp:  [98.4 F (36.9 C)-98.8 F (37.1 C)] 98.8 F (37.1 C) (08/31 0539) Pulse Rate:  [95-97] 97 (08/31 0539) Resp:  [18] 18 (08/31 0539) BP: (139-149)/(89-103) 143/94 (08/31 0539) SpO2:  [95 %-100 %] 95 % (08/31 0539) Weight:  [66.7 kg] 66.7 kg (08/31 0630) Last BM Date : 06/06/24  Intake/Output from previous day: 08/30 0701 - 08/31 0700 In: 1715.5 [I.V.:881.5; IV Piggyback:834] Out: 1500 [Urine:600; Emesis/NG output:900] Intake/Output this shift: Total I/O In: -  Out: 150 [Urine:150]  Exam: Patient is awake and alert and at baseline mental status Abdomen is mildly full but nontender.  Patient's open midline wound is clean with packing in place  Lab Results:  Recent Labs    06/11/24 0311 06/13/24 0545  WBC 15.2* 11.8*  HGB 11.2* 10.0*  HCT 34.5* 30.9*  PLT 300 297   BMET Recent Labs    06/11/24 0311 06/13/24 0545  NA 139 141  K 4.1 4.1  CL 102 104  CO2 28 28  GLUCOSE 123* 110*  BUN 16 26*  CREATININE 0.46 0.39*  CALCIUM  8.3* 8.4*   PT/INR No results for input(s): LABPROT, INR in the last 72 hours. ABG No results for input(s): PHART, HCO3 in the last 72 hours.  Invalid input(s): PCO2, PO2  Studies/Results: No results found.  Anti-infectives: Anti-infectives (From admission, onward)    Start     Dose/Rate Route Frequency Ordered Stop   06/10/24 0300  ceFEPIme  (MAXIPIME ) 2 g in sodium chloride  0.9 % 100 mL IVPB        2 g 200 mL/hr over 30 Minutes Intravenous Every 12 hours 06/09/24 1732 06/17/24 0259   06/09/24 2100  metroNIDAZOLE  (FLAGYL ) IVPB 500 mg        500 mg 100 mL/hr over 60 Minutes Intravenous Every 12 hours 06/09/24 1725 06/16/24 2059   06/09/24 1730  ceFEPIme  (MAXIPIME ) 1 g in sodium chloride  0.9 % 100 mL IVPB  Status:   Discontinued        1 g 200 mL/hr over 30 Minutes Intravenous Every 12 hours 06/09/24 1725 06/09/24 1731   06/09/24 1500  ceFEPIme  (MAXIPIME ) 2 g in sodium chloride  0.9 % 100 mL IVPB        2 g 200 mL/hr over 30 Minutes Intravenous  Once 06/09/24 1454 06/09/24 1743   06/03/24 1130  vancomycin  (VANCOCIN ) IVPB 1000 mg/200 mL premix  Status:  Discontinued        1,000 mg 200 mL/hr over 60 Minutes Intravenous Every 24 hours 06/02/24 1149 06/03/24 0949   06/03/24 0000  ceFEPIme  (MAXIPIME ) 2 g in sodium chloride  0.9 % 100 mL IVPB        2 g 200 mL/hr over 30 Minutes Intravenous Every 12 hours 06/02/24 1149 06/08/24 1553   06/02/24 2200  metroNIDAZOLE  (FLAGYL ) IVPB 500 mg  Status:  Discontinued        500 mg 100 mL/hr over 60 Minutes Intravenous Every 12 hours 06/02/24 1113 06/09/24 1730   06/02/24 1100  ceFEPIme  (MAXIPIME ) 2 g in sodium chloride  0.9 % 100 mL IVPB        2 g 200 mL/hr over 30 Minutes Intravenous  Once 06/02/24 1045 06/02/24 1449   06/02/24 1100  vancomycin  (VANCOCIN ) IVPB 1000 mg/200 mL premix        1,000 mg  200 mL/hr over 60 Minutes Intravenous  Once 06/02/24 1045 06/02/24 1233   06/02/24 1100  metroNIDAZOLE  (FLAGYL ) IVPB 500 mg        500 mg 100 mL/hr over 60 Minutes Intravenous  Once 06/02/24 1045 06/02/24 1818       Assessment/Plan: s/p Procedure(s) with comments: LAPAROSCOPY, DIAGNOSTIC, EXPLORATORY LAPAROTOMY WITH SMALL BOWEL RESECTION (N/A) - Possible laparotomy  - Will hold on replacing the NG tube unless the patient develops abdominal pain, nausea, or emesis.  Will continue clear liquids for now as well. -Continue wound care -PT  LOS: 11 days    Lindsay Mason 06/13/2024

## 2024-06-13 NOTE — Plan of Care (Signed)
  Problem: Clinical Measurements: Goal: Diagnostic test results will improve Outcome: Progressing Goal: Signs and symptoms of infection will decrease Outcome: Progressing   Problem: Clinical Measurements: Goal: Diagnostic test results will improve Outcome: Progressing   Problem: Activity: Goal: Risk for activity intolerance will decrease Outcome: Progressing

## 2024-06-13 NOTE — Progress Notes (Signed)
 This shift Clinical research associate was notified of removal of pts NGT AT 0100. Attending notified  that pt refused insertion of new one. Informed to make dayshift RN  aware and continue to monitor pts condition overnight due to NGT removal. Pt exhibited no issues overnight, plan of care ongoing

## 2024-06-13 NOTE — Progress Notes (Addendum)
 PROGRESS NOTE  [name] FMW:[id number] DOB: 05/03/1951 DOA: 06/02/2024 PCP: Lindsay Baller, MD   LOS: 11 days   Brief narrative:  Lindsay Mason is a 73 y.o. 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  Patient had lethargy and worsening mental status.  In the ED, patient had slightly low blood pressure.  Labs were notable for WBC elevated at 13.7.  CT abdomen/pelvis done on 06/01/2024 with no acute findings but scattered colonic diverticulosis with small diverticulitis.  Patient was then considered for admission to the hospital for further evaluation and treatment. During hospitalization, patient continued to have abdominal distention and pain so general surgery was consulted.  Currently, being treated for small bowel obstruction/ileus initially conservatively but since that did not improve and had significant CT findings, underwent exploratory laparotomy with small bowel resection on 06/09/2024.  Patient is currently on TPN and awaiting for clinical improvement.  Assessment/Plan: Principal Problem:   SIRS (systemic inflammatory response syndrome) (HCC) Active Problems:   COPD with emphysema (HCC)   Thoracic aortic atherosclerosis (HCC)   History of essential hypertension   Severe early onset Alzheimer dementia (HCC)   PAD (peripheral artery disease) (HCC)   Confusion and disorientation   Positive blood culture   Nausea and vomiting   Abdominal pain   Hyperlipidemia   Abnormal EKG   Abdominal pain/  Nausea and vomiting  Sepsis likely secondary to possible acute diverticulitis Small bowel obstruction/ileus Patient initially had leukocytosis with hypotension and lethargy and clinical suspicion for  diverticulitis suggestive of possible sepsis.  Initial CT scan of the abdomen initially was negative for acute findings but had clinical symptoms of diverticulitis.  Patient was continued on cefepime  and metronidazole .  Blood cultures negative in  5 days.  General surgery was consulted due to persistent abdominal distention and pain.  Initial imaging with x-ray of the abdomen showed possible SBO/ileus.  Patient was then started on conservative treatment with NG tube IV fluid with electrolyte replacement but continued to have pain and distention with NG output.  Repeat CT scan done 06/08/2024 showed significant thickening of the pelvic loops of the small bowel concerning for ischemia.  Patient on underwent diagnostic laparoscopy, exploratory laparotomy with small bowel resection on 06/09/2024.  Was on NG tube which has come off at this time.  Has been started on clears by general surgery.  On TPN, Follow general surgery recommendation.  Hypophosphatemia.  Improved after replacement.  Latest phosphorus of 2.8.  Hypokalemia.  Improved after repletion.  Latest potassium of 4.1.  Metabolic encephalopathy on the background of Alzheimer's dementia.   Likely secondary to infection.  On nortriptyline  trazodone  Exelon  at home..  On mittens and one-to-one sitter.    Positive blood culture with staph species. Contamination suspected.     COPD with emphysema  Continue albuterol  inhaler.  Appears compensated.  On room air     Hyperlipidemia   PAD (peripheral artery disease)    Thoracic aortic atherosclerosis Continue to hold Lipitor     History of essential hypertension On as needed IV hydralazine   Weakness, deconditioning.  Seen by PT OT and recommended skilled nursing facility placement   DVT prophylaxis: enoxaparin  (LOVENOX ) injection 40 mg Start: 06/10/24 1400 SCDs Start: 06/02/24 1114   Disposition: Plan for skilled nursing facility when improved.  Status is: Inpatient Remains inpatient  appropriate because: Pending clinical improvement, status post exploratory laparotomy, TPN,   Code Status:     Code Status: Full Code  Family Communication: Spoke with the patient's daughter-in-law at bedside on 06/12/2024.  Consultants: General Surgery  Procedures: NG tube placement Exploratory laparotomy with small bowel resection on 06/09/2024  Anti-infectives:  Cefepime  and Flagyl  IV  Anti-infectives (From admission, onward)    Start     Dose/Rate Route Frequency Ordered Stop   06/10/24 0300  ceFEPIme  (MAXIPIME ) 2 g in sodium chloride  0.9 % 100 mL IVPB        2 g 200 mL/hr over 30 Minutes Intravenous Every 12 hours 06/09/24 1732 06/17/24 0259   06/09/24 2100  metroNIDAZOLE  (FLAGYL ) IVPB 500 mg        500 mg 100 mL/hr over 60 Minutes Intravenous Every 12 hours 06/09/24 1725 06/16/24 2059   06/09/24 1730  ceFEPIme  (MAXIPIME ) 1 g in sodium chloride  0.9 % 100 mL IVPB  Status:  Discontinued        1 g 200 mL/hr over 30 Minutes Intravenous Every 12 hours 06/09/24 1725 06/09/24 1731   06/09/24 1500  ceFEPIme  (MAXIPIME ) 2 g in sodium chloride  0.9 % 100 mL IVPB        2 g 200 mL/hr over 30 Minutes Intravenous  Once 06/09/24 1454 06/09/24 1743   06/03/24 1130  vancomycin  (VANCOCIN ) IVPB 1000 mg/200 mL premix  Status:  Discontinued        1,000 mg 200 mL/hr over 60 Minutes Intravenous Every 24 hours 06/02/24 1149 06/03/24 0949   06/03/24 0000  ceFEPIme  (MAXIPIME ) 2 g in sodium chloride  0.9 % 100 mL IVPB        2 g 200 mL/hr over 30 Minutes Intravenous Every 12 hours 06/02/24 1149 06/08/24 1553   06/02/24 2200  metroNIDAZOLE  (FLAGYL ) IVPB 500 mg  Status:  Discontinued        500 mg 100 mL/hr over 60 Minutes Intravenous Every 12 hours 06/02/24 1113 06/09/24 1730   06/02/24 1100  ceFEPIme  (MAXIPIME ) 2 g in sodium chloride  0.9 % 100 mL IVPB        2 g 200 mL/hr over 30 Minutes Intravenous  Once 06/02/24 1045 06/02/24 1449   06/02/24 1100  vancomycin  (VANCOCIN ) IVPB 1000 mg/200 mL  premix        1,000 mg 200 mL/hr over 60 Minutes Intravenous  Once 06/02/24 1045 06/02/24 1233   06/02/24 1100  metroNIDAZOLE  (FLAGYL ) IVPB 500 mg        500 mg 100 mL/hr over 60 Minutes Intravenous  Once 06/02/24 1045 06/02/24 1818       Subjective: Today, patient was seen and examined at bedside.  Sitter at bedside.  Alert awake and denies any interval complaints.  NG tube had come out and has been seen by general surgery.  On clears.  Objective: Vitals:   06/12/24 2339 06/13/24 0539  BP: (!) 149/103 (!) 143/94  Pulse: 95 97  Resp: 18 18  Temp: 98.4 F (36.9 C) 98.8 F (37.1 C)  SpO2: 100% 95%    Intake/Output Summary (Last 24 hours) at 06/13/2024 0920 Last data filed at 06/13/2024 0820 Gross per 24 hour  Intake 1715.45 ml  Output 1450 ml  Net 265.45 ml   Filed Weights   06/02/24 0812 06/12/24 0655 06/13/24 0630  Weight: 61.3 kg 66.4 kg 66.7 kg   Body mass index is 26.9 kg/m.   Physical Exam:  General:  Average built,  not in obvious distress, Communicative, has cognitive dysfunction, chronically ill and deconditioned. HENT:   No scleral pallor or icterus noted. Oral mucosa is moist.  Chest:   Diminished breath sounds bilaterally.  CVS: S1 &S2 heard. No murmur.  Regular rate and rhythm. Abdomen: Soft, abdominal dressing in place,  extremities: No cyanosis, clubbing or edema.  Peripheral pulses are palpable. Psych: Alert, awake and Communicative, CNS:  No cranial nerve deficits.  Generalized weakness noted.  Moves all extremities, Skin: Warm and dry.  No rashes noted.  Data Review: I have personally reviewed the following laboratory data and studies,  CBC: Recent Labs  Lab 06/07/24 0633 06/08/24 0531 06/09/24 0208 06/11/24 0311 06/13/24 0545  WBC 7.8 12.1* 12.1* 15.2* 11.8*  HGB 11.8* 12.4 11.9* 11.2* 10.0*  HCT 38.1 39.4 37.4 34.5* 30.9*  MCV 91.4 91.0 90.1 90.6 90.1  PLT 309 324 318 300 297   Basic Metabolic Panel: Recent Labs  Lab 06/08/24 0531  06/09/24 0208 06/10/24 0332 06/11/24 0311 06/13/24 0545  NA 138 144 140 139 141  K 3.3* 3.5 4.1 4.1 4.1  CL 105 105 103 102 104  CO2 27 27 24 28 28   GLUCOSE 108* 113* 181* 123* 110*  BUN 6* <5* 9 16 26*  CREATININE 0.50 0.40* 0.43* 0.46 0.39*  CALCIUM  7.7* 8.1* 8.1* 8.3* 8.4*  MG 2.0 2.0 1.9 2.1 2.2  PHOS 1.9* 2.3* 2.3* 2.8 3.2   Liver Function Tests: Recent Labs  Lab 06/08/24 0531 06/09/24 0208 06/10/24 0332  AST 30 24 22   ALT 21 17 15   ALKPHOS 64 74 59  BILITOT 0.3 0.3 0.3  PROT 5.0* 5.0* 5.0*  ALBUMIN  2.5* 2.9* 3.3*   No results for input(s): LIPASE, AMYLASE in the last 168 hours.  No results for input(s): AMMONIA in the last 168 hours. Cardiac Enzymes: No results for input(s): CKTOTAL, CKMB, CKMBINDEX, TROPONINI in the last 168 hours. BNP (last 3 results) No results for input(s): BNP in the last 8760 hours.  ProBNP (last 3 results) No results for input(s): PROBNP in the last 8760 hours.  CBG: Recent Labs  Lab 06/12/24 0624 06/12/24 1402 06/12/24 1806 06/12/24 2334 06/13/24 0537  GLUCAP 128* 135* 135* 123* 116*   No results found for this or any previous visit (from the past 240 hours).    Studies: No results found.     Jermell Holeman, MD  [facility] 06/13/2024  If 7PM-7AM, please contact night-coverage

## 2024-06-13 NOTE — Progress Notes (Signed)
 PHARMACY - TOTAL PARENTERAL NUTRITION CONSULT NOTE   Indication: Prolonged ileus  Patient Measurements: Height: 5' 2 (157.5 cm) Weight: 66.7 kg (147 lb 0.8 oz) IBW/kg (Calculated) : 50.1 TPN AdjBW (KG): 61.3 Body mass index is 26.9 kg/m.   Assessment: Presented to ED with N/V/D and abdominal pain x 3d on 05/31/24. CT negative. Possibly from diverticulitis and placed on Cefepime /Flagyl . Concern for SBO, NGT placed. Continues to have persistent pain and distention. No significant intake since 8/21. Pharmacy consulted to start TPN on 8/26.  S/p ex lap with small bowel resection. Remains NPO with NG in place until bowel function returns.  Glucose / Insulin : No h/o DM -CBGs 110-135, within goal -2 units SSI/24 hrs Electrolytes: WNL, CoCa slightly low but improving Renal: Scr <1, BUN slightly elevated Hepatic: 8/28 labs: LFTs WNL; alb 3.3 (slightly low) Intake / Output; MIVF:  -NGT increased to in last 24hrs - NGT came out overnight, CCS not replacing unless patient develops abd pain/N/V -UOP decreased to in last 24hrs -LBM 8/27  GI Imaging: 8/19 CT: Scattered colonic diverticulosis. No active diverticulitis.  8/22, 8/23, 8/25, 8/26 Abd Xray: all SBO 8/26 CT: severely thickened and inflamed central pelvic loops of small bowel concerning for ischemia. Concern for mesenteric volvulus or closed loop obstruction. GI Surgeries / Procedures:  8/27 Ex lap with small bowel resection  Central access: PICC TPN start date: 8/26  Nutritional Goals: Goal TPN rate is 80 mL/hr (provides 86 g of protein and  1759 kcals per day)  RD Assessment: Kcal 1650-1850 Protein 75-90 g Fluid 1.8 L/day  Current Nutrition:  Clear liquids and TPN  Plan:  -Continue TPN at goal 80 mL/hr -Electrolytes in TPN:  Na 50 mEq/L K 40 mEq/L Ca 5 mEq/L Mg 5 mEq/L Phos 15 mmol/L Cl:Ac 1:1 -Add standard MVI and trace elements to TPN -Continue thiamine  100 mg IV x 5 days for refeeding  risk -Continue Sensitive q6h SSI and adjust as needed  -Monitor TPN labs on Mon/Thurs, and PRN -Per surgery, continue CLD today and hold off on replacing NGT unless return of abd pain/N/V   Lacinda Moats, PharmD Clinical Pharmacist  8/31/20259:07 AM

## 2024-06-14 DIAGNOSIS — J439 Emphysema, unspecified: Secondary | ICD-10-CM

## 2024-06-14 DIAGNOSIS — R531 Weakness: Secondary | ICD-10-CM

## 2024-06-14 DIAGNOSIS — R6510 Systemic inflammatory response syndrome (SIRS) of non-infectious origin without acute organ dysfunction: Secondary | ICD-10-CM | POA: Diagnosis not present

## 2024-06-14 DIAGNOSIS — J449 Chronic obstructive pulmonary disease, unspecified: Secondary | ICD-10-CM | POA: Diagnosis not present

## 2024-06-14 DIAGNOSIS — R9431 Abnormal electrocardiogram [ECG] [EKG]: Secondary | ICD-10-CM

## 2024-06-14 DIAGNOSIS — I700 Atherosclerosis of aorta: Secondary | ICD-10-CM | POA: Diagnosis not present

## 2024-06-14 DIAGNOSIS — F02C0 Dementia in other diseases classified elsewhere, severe, without behavioral disturbance, psychotic disturbance, mood disturbance, and anxiety: Secondary | ICD-10-CM

## 2024-06-14 DIAGNOSIS — I10 Essential (primary) hypertension: Secondary | ICD-10-CM

## 2024-06-14 LAB — MAGNESIUM: Magnesium: 2.2 mg/dL (ref 1.7–2.4)

## 2024-06-14 LAB — COMPREHENSIVE METABOLIC PANEL WITH GFR
ALT: 20 U/L (ref 0–44)
AST: 33 U/L (ref 15–41)
Albumin: 3.1 g/dL — ABNORMAL LOW (ref 3.5–5.0)
Alkaline Phosphatase: 73 U/L (ref 38–126)
Anion gap: 10 (ref 5–15)
BUN: 24 mg/dL — ABNORMAL HIGH (ref 8–23)
CO2: 25 mmol/L (ref 22–32)
Calcium: 8.7 mg/dL — ABNORMAL LOW (ref 8.9–10.3)
Chloride: 105 mmol/L (ref 98–111)
Creatinine, Ser: 0.38 mg/dL — ABNORMAL LOW (ref 0.44–1.00)
GFR, Estimated: >60 mL/min (ref >60)
Glucose, Bld: 132 mg/dL — ABNORMAL HIGH (ref 70–99)
Potassium: 4.4 mmol/L (ref 3.5–5.1)
Sodium: 140 mmol/L (ref 135–145)
Total Bilirubin: 0.3 mg/dL (ref 0.0–1.2)
Total Protein: 5.3 g/dL — ABNORMAL LOW (ref 6.5–8.1)

## 2024-06-14 LAB — GLUCOSE, CAPILLARY
Glucose-Capillary: 82 mg/dL (ref 70–99)
Glucose-Capillary: 185 mg/dL — ABNORMAL HIGH (ref 70–99)
Glucose-Capillary: 154 mg/dL — ABNORMAL HIGH (ref 70–99)
Glucose-Capillary: 148 mg/dL — ABNORMAL HIGH (ref 70–99)

## 2024-06-14 LAB — CBC
HCT: 35 % — ABNORMAL LOW (ref 36.0–46.0)
Hemoglobin: 11 g/dL — ABNORMAL LOW (ref 12.0–15.0)
MCH: 28.1 pg (ref 26.0–34.0)
MCHC: 31.4 g/dL (ref 30.0–36.0)
MCV: 89.5 fL (ref 80.0–100.0)
Platelets: 416 K/uL — ABNORMAL HIGH (ref 150–400)
RBC: 3.91 MIL/uL (ref 3.87–5.11)
RDW: 14.4 % (ref 11.5–15.5)
WBC: 11.4 K/uL — ABNORMAL HIGH (ref 4.0–10.5)
nRBC: 0 % (ref 0.0–0.2)

## 2024-06-14 LAB — PHOSPHORUS: Phosphorus: 3.3 mg/dL (ref 2.5–4.6)

## 2024-06-14 LAB — TRIGLYCERIDES: Triglycerides: 103 mg/dL (ref <150)

## 2024-06-14 MED ORDER — TRAVASOL 10 % IV SOLN
INTRAVENOUS | Status: AC
  Filled 2024-06-14: qty 864

## 2024-06-14 NOTE — TOC Progression Note (Signed)
 Transition of Care [facility]) - Progression Note   Patient Details  Name: Lindsay Mason MRN: [id number] Date of Birth: [birth year]  [facility] CM/SW Contact  Duwaine GORMAN Aran, LCSW Phone Number: 06/14/2024, 2:24 PM  Clinical Narrative: PT evaluation now recommending SNF. CSW left voicemail for daughter, Zaiah Credeur, regarding recommendation. Care management awaiting family follow up.  Expected Discharge Plan: Skilled Nursing Facility Barriers to Discharge: Continued Medical Work up  Expected Discharge Plan and Services In-house Referral: Clinical Social Work Discharge Planning Services: CM Consult              DME Arranged: N/A DME Agency: NA  Social Drivers of Health (SDOH) Interventions SDOH Screenings   Food Insecurity: No Food Insecurity (06/02/2024)  Housing: Low Risk  (06/02/2024)  Transportation Needs: No Transportation Needs (06/02/2024)  Utilities: Not At Risk (06/02/2024)  Alcohol Screen: Low Risk  (05/05/2024)  Depression (PHQ2-9): High Risk (05/07/2024)  Financial Resource Strain: Low Risk  (05/05/2024)  Physical Activity: Sufficiently Active (05/05/2024)  Social Connections: Moderately Isolated (06/02/2024)  Stress: No Stress Concern Present (05/05/2024)  Tobacco Use: Medium Risk (06/09/2024)  Health Literacy: Inadequate Health Literacy (05/05/2024)   Readmission Risk Interventions    01/11/2024   10:01 AM  Readmission Risk Prevention Plan  Post Dischage Appt Complete  Medication Screening Complete  Transportation Screening Complete

## 2024-06-14 NOTE — Progress Notes (Signed)
 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  Patient had lethargy and worsening mental status.  In the ED patient had slightly low blood pressure.  White blood cell was elevated at 13.  CT abdomen and pelvis done on 06/01/2024 with no acute finding but is scattered colonic diverticulosis with a small diverticulitis.  Patient was then considered for admission to the hospital for further evaluation and treatment.  During hospitalization patient continued to have abdominal distention and pain, general surgery was consulted.  Currently being treated for small bowel obstruction/ileus initially conservatively but since Lindsay Mason did not had any improvement and had a significant CT finding underwent exploratory laparotomy with a small bowel resection 06/09/2024.  Patient is currently on TPN and awaiting for clinical improvement.   Assessment & Plan:   Principal Problem:   SIRS (systemic inflammatory response syndrome) (HCC) Active Problems:   COPD with emphysema (HCC)   Thoracic aortic atherosclerosis (HCC)   History of essential hypertension   Severe early onset Alzheimer dementia (HCC)   PAD (peripheral artery disease) (HCC)   Confusion and disorientation   Positive blood culture   Nausea and vomiting   Abdominal pain   Hyperlipidemia   Abnormal EKG  Abdominal pain/  Nausea and vomiting: Sepsis likely secondary to possible acute diverticulitis Small bowel obstruction/ileus Patient initially had leukocytosis with hypotension and lethargy and clinical suspicion for diverticulitis suggestive of  possible sepsis.  Initial CT scan of the abdomen initially was negative for acute findings but had clinical symptoms of diverticulitis.   -Continue with cefepime  and metronidazole .  Blood cultures negative in  5 days.  -General surgery was consulted due to persistent abdominal distention and pain.  Initial imaging with x-ray of the abdomen showed possible SBO/ileus.  Patient was then started on conservative treatment with NG tube IV fluid with electrolyte replacement but continued to have pain and distention with NG output.  Repeat CT scan done 06/08/2024 showed significant thickening of the pelvic loops of the small bowel concerning for ischemia. -  Patient on underwent diagnostic laparoscopy, exploratory laparotomy with small bowel resection on 06/09/2024.  Was on NG tube which has come off at this time.  -On TPN, Full liquid diet.    Hypophosphatemia. Replaced.    Hypokalemia. Replaced.    Metabolic Encephalopathy on the background of Alzheimer's dementia.   Likely secondary to infection.  On nortriptyline  trazodone  Exelon  at home..  On mittens and one-to-one sitter.   Positive blood culture with staph species. Contamination suspected.   COPD with emphysema  Continue albuterol  inhaler.   Hyperlipidemia PAD (peripheral artery disease)  Thoracic aortic atherosclerosis -Holding Lipitor   History of essential hypertension On as needed IV hydralazine    Weakness, deconditioning.  Seen by PT OT and recommended skilled nursing facility placement      Nutrition Problem: Inadequate oral intake Etiology:  (PSBO)    Signs/Symptoms: NPO status    Interventions: TPN  Estimated body mass index is 26.77 kg/m as calculated from the following:   Height as of this encounter: 5' 2 (1.575 m).   Weight as of this encounter: 66.4 kg.   DVT prophylaxis: Lovenox  Code Status: Full code Family Communication:  Disposition Plan:  Status is: Inpatient Remains inpatient appropriate because:  management o post op     Consultants:  Surgery   Procedures:    Antimicrobials:    Subjective: Lindsay Mason keep eyes close, would answer simple questions. Report feeling cold. I offer her to cover her with blankets. Lindsay Mason said  I would appreciate that  Objective: Vitals:   06/13/24 0630 06/13/24 1456 06/13/24 2006 06/14/24 0548  BP:  132/60 (!) 136/90 (!) 147/91  Pulse:  68 98 (!) 103  Resp:  14 20 20   Temp:  98.4 F (36.9 C) 98.5 F (36.9 C) 98.5 F (36.9 C)  TempSrc:  Axillary    SpO2:  97%  97%  Weight: 66.7 kg   66.4 kg  Height:        Intake/Output Summary (Last 24 hours) at 06/14/2024 0825 Last data filed at 06/14/2024 0403 Gross per 24 hour  Intake 2168.67 ml  Output 700 ml  Net 1468.67 ml   Filed Weights   06/12/24 0655 06/13/24 0630 06/14/24 0548  Weight: 66.4 kg 66.7 kg 66.4 kg    Examination:  General exam: Appears calm and comfortable  Respiratory system: Clear to auscultation. Respiratory effort normal. Cardiovascular system: S1 & S2 heard, RRR.  Gastrointestinal system: Abdomen is soft, mid abdomen with dressing Central nervous system: Alert  Extremities: Symmetric 5 x 5 power.   Data Reviewed: I have personally reviewed following labs and imaging studies  CBC: Recent Labs  Lab 06/08/24 0531 06/09/24 0208 06/11/24 0311 06/13/24 0545 06/14/24 0600  WBC 12.1* 12.1* 15.2* 11.8* 11.4*  HGB 12.4 11.9* 11.2* 10.0* 11.0*  HCT 39.4 37.4 34.5* 30.9* 35.0*  MCV 91.0 90.1 90.6 90.1 89.5  PLT 324 318 300 297 416*   Basic Metabolic Panel: Recent Labs  Lab 06/09/24 0208 06/10/24 0332 06/11/24 0311 06/13/24 0545 06/14/24 0600  NA 144 140 139 141 140  K 3.5 4.1 4.1 4.1 4.4  CL 105 103 102 104 105  CO2 27 24 28 28 25   GLUCOSE 113* 181* 123* 110* 132*  BUN <5* 9 16 26* 24*  CREATININE 0.40* 0.43* 0.46 0.39* 0.38*  CALCIUM  8.1* 8.1* 8.3* 8.4* 8.7*  MG 2.0 1.9 2.1 2.2 2.2  PHOS 2.3* 2.3* 2.8 3.2 3.3   GFR: Estimated Creatinine Clearance: 56  mL/min (A) (by C-G formula based on SCr of 0.38 mg/dL (L)). Liver Function Tests: Recent Labs  Lab 06/08/24 0531 06/09/24 0208 06/10/24 0332 06/14/24 0600  AST 30 24 22  33  ALT 21 17 15 20   ALKPHOS 64 74 59 73  BILITOT 0.3 0.3 0.3 0.3  PROT 5.0* 5.0* 5.0* 5.3*  ALBUMIN  2.5* 2.9* 3.3* 3.1*   No results for input(s): LIPASE, AMYLASE in the last 168 hours. No results for input(s): AMMONIA in the last 168 hours. Coagulation Profile: No results for input(s): INR, PROTIME in the last 168 hours. Cardiac Enzymes: No results for input(s): CKTOTAL, CKMB, CKMBINDEX, TROPONINI in the last 168 hours. BNP (last 3 results) No results for input(s): PROBNP in the last 8760 hours. HbA1C: No results for input(s): HGBA1C in the last 72 hours. CBG: Recent Labs  Lab 06/13/24 0537 06/13/24 1138 06/13/24 1702 06/13/24 2352 06/14/24 0544  GLUCAP 116* 120* 113* 127* 82   Lipid Profile: Recent Labs    06/14/24 0600  TRIG 103   Thyroid  Function Tests: No results for input(s): TSH, T4TOTAL, FREET4, T3FREE, THYROIDAB in the last 72 hours. Anemia Panel: No results for input(s): VITAMINB12, FOLATE, FERRITIN, TIBC,  IRON, RETICCTPCT in the last 72 hours. Sepsis Labs: No results for input(s): PROCALCITON, LATICACIDVEN in the last 168 hours.  No results found for this or any previous visit (from the past 240 hours).       Radiology Studies: No results found.      Scheduled Meds:  Chlorhexidine  Gluconate Cloth  6 each Topical Daily   enoxaparin  (LOVENOX ) injection  40 mg Subcutaneous Q24H   feeding supplement  1 Container Oral TID BM   insulin  aspart  0-9 Units Subcutaneous Q6H   LORazepam   0.5 mg Intravenous Once   methocarbamol  (ROBAXIN ) injection  500 mg Intravenous Q8H   rivastigmine   9.5 mg Transdermal Daily   sodium chloride  flush  10-40 mL Intracatheter Q12H   Continuous Infusions:  ceFEPime  (MAXIPIME ) IV Stopped (06/14/24  0257)   metronidazole  Stopped (06/13/24 2131)   TPN ADULT (ION) 80 mL/hr at 06/14/24 0324     LOS: 12 days    Time spent: 35 minutes    Nicholad Kautzman A Vanya Carberry, MD [facility]   If 7PM-7AM, please contact night-coverage [email or web address]  06/14/2024, 8:25 AM

## 2024-06-14 NOTE — Progress Notes (Signed)
 5 Days Post-Op   Subjective/Chief Complaint: Had a bm this morning Tolerated clears Walked a little in the room with assistance   Objective: Vital signs in last 24 hours: Temp:  [98.4 F (36.9 C)-98.5 F (36.9 C)] 98.5 F (36.9 C) (09/01 0548) Pulse Rate:  [68-103] 103 (09/01 0548) Resp:  [14-20] 20 (09/01 0548) BP: (132-147)/(60-91) 147/91 (09/01 0548) SpO2:  [97 %] 97 % (09/01 0548) Weight:  [66.4 kg] 66.4 kg (09/01 0548) Last BM Date : 06/06/24  Intake/Output from previous day: 08/31 0701 - 09/01 0700 In: 2168.7 [P.O.:60; I.V.:1708.7; IV Piggyback:400] Out: 1020 [Urine:1020] Intake/Output this shift: No intake/output data recorded.  Exam: Awake and alert, at baseline Abdomen still somewhat full, mildly tender, incision clean with packing in place  Lab Results:  Recent Labs    06/13/24 0545 06/14/24 0600  WBC 11.8* 11.4*  HGB 10.0* 11.0*  HCT 30.9* 35.0*  PLT 297 416*   BMET Recent Labs    06/13/24 0545 06/14/24 0600  NA 141 140  K 4.1 4.4  CL 104 105  CO2 28 25  GLUCOSE 110* 132*  BUN 26* 24*  CREATININE 0.39* 0.38*  CALCIUM  8.4* 8.7*   PT/INR No results for input(s): LABPROT, INR in the last 72 hours. ABG No results for input(s): PHART, HCO3 in the last 72 hours.  Invalid input(s): PCO2, PO2  Studies/Results: No results found.  Anti-infectives: Anti-infectives (From admission, onward)    Start     Dose/Rate Route Frequency Ordered Stop   06/10/24 0300  ceFEPIme  (MAXIPIME ) 2 g in sodium chloride  0.9 % 100 mL IVPB        2 g 200 mL/hr over 30 Minutes Intravenous Every 12 hours 06/09/24 1732 06/17/24 0259   06/09/24 2100  metroNIDAZOLE  (FLAGYL ) IVPB 500 mg        500 mg 100 mL/hr over 60 Minutes Intravenous Every 12 hours 06/09/24 1725 06/16/24 2059   06/09/24 1730  ceFEPIme  (MAXIPIME ) 1 g in sodium chloride  0.9 % 100 mL IVPB  Status:  Discontinued        1 g 200 mL/hr over 30 Minutes Intravenous Every 12 hours 06/09/24 1725  06/09/24 1731   06/09/24 1500  ceFEPIme  (MAXIPIME ) 2 g in sodium chloride  0.9 % 100 mL IVPB        2 g 200 mL/hr over 30 Minutes Intravenous  Once 06/09/24 1454 06/09/24 1743   06/03/24 1130  vancomycin  (VANCOCIN ) IVPB 1000 mg/200 mL premix  Status:  Discontinued        1,000 mg 200 mL/hr over 60 Minutes Intravenous Every 24 hours 06/02/24 1149 06/03/24 0949   06/03/24 0000  ceFEPIme  (MAXIPIME ) 2 g in sodium chloride  0.9 % 100 mL IVPB        2 g 200 mL/hr over 30 Minutes Intravenous Every 12 hours 06/02/24 1149 06/08/24 1553   06/02/24 2200  metroNIDAZOLE  (FLAGYL ) IVPB 500 mg  Status:  Discontinued        500 mg 100 mL/hr over 60 Minutes Intravenous Every 12 hours 06/02/24 1113 06/09/24 1730   06/02/24 1100  ceFEPIme  (MAXIPIME ) 2 g in sodium chloride  0.9 % 100 mL IVPB        2 g 200 mL/hr over 30 Minutes Intravenous  Once 06/02/24 1045 06/02/24 1449   06/02/24 1100  vancomycin  (VANCOCIN ) IVPB 1000 mg/200 mL premix        1,000 mg 200 mL/hr over 60 Minutes Intravenous  Once 06/02/24 1045 06/02/24 1233   06/02/24 1100  metroNIDAZOLE  (FLAGYL ) IVPB 500 mg        500 mg 100 mL/hr over 60 Minutes Intravenous  Once 06/02/24 1045 06/02/24 1818       Assessment/Plan: POD# 5 s/p diagnostic laparoscopy, exp laparotomy, small bowel resection for small bowel perf/abscess  -continue wound care -PT -try full liquids -repeat CT if WBC increases -I discussed with family at the bedside   LOS: 12 days    Lindsay Mason 06/14/2024

## 2024-06-14 NOTE — Plan of Care (Signed)
  Problem: Fluid Volume: Goal: Hemodynamic stability will improve Outcome: Progressing   Problem: Clinical Measurements: Goal: Diagnostic test results will improve Outcome: Progressing Goal: Signs and symptoms of infection will decrease Outcome: Progressing   Problem: Coping: Goal: Level of anxiety will decrease Outcome: Progressing   Problem: Elimination: Goal: Will not experience complications related to bowel motility Outcome: Progressing

## 2024-06-14 NOTE — Progress Notes (Signed)
 PHARMACY - TOTAL PARENTERAL NUTRITION CONSULT NOTE   Indication: Prolonged ileus  Patient Measurements: Height: 5' 2 (157.5 cm) Weight: 66.4 kg (146 lb 6.2 oz) IBW/kg (Calculated) : 50.1 TPN AdjBW (KG): 61.3 Body mass index is 26.77 kg/m.   Assessment: Presented to ED with N/V/D and abdominal pain x 3d on 05/31/24. CT negative. Possibly from diverticulitis and placed on Cefepime /Flagyl . Concern for SBO, NGT placed. Continues to have persistent pain and distention. No significant intake since 8/21. Pharmacy consulted to start TPN on 8/26.  S/p ex lap with small bowel resection.  Glucose / Insulin : No h/o DM -CBGs 82-127, within goal -1 units SSI/24 hrs Electrolytes: WNL, including coCa (9.4) Renal: Scr <1, BUN slightly elevated Hepatic: LFTs WNL; alb 3.1; TG 103 Intake / Output; MIVF:  -Tolerating CLD, advance to FLD -UOP likely not fully documented -LBM 8/27  GI Imaging: 8/19 CT: Scattered colonic diverticulosis. No active diverticulitis.  8/22, 8/23, 8/25, 8/26 Abd Xray: all SBO 8/26 CT: severely thickened and inflamed central pelvic loops of small bowel concerning for ischemia. Concern for mesenteric volvulus or closed loop obstruction. GI Surgeries / Procedures:  8/27 Ex lap with small bowel resection  Central access: PICC TPN start date: 8/26  Nutritional Goals: Goal TPN rate is 80 mL/hr (provides 86 g of protein and  1759 kcals per day)  RD Assessment: Kcal 1650-1850 Protein 75-90 g Fluid 1.8 L/day  Current Nutrition:  FLD  Plan:  -Continue TPN at goal 80 mL/hr -Electrolytes in TPN:  Na 50 mEq/L K 40 mEq/L Ca 5 mEq/L Mg 5 mEq/L Phos 15 mmol/L Cl:Ac 1:1 -Add standard MVI and trace elements to TPN -Continue Sensitive q6h SSI and adjust as needed  -Monitor TPN labs on Mon/Thurs, and PRN -Diet advanced to FLD today, may be able to stop TPN next 24-48 hrs if tolerating continued advancement   Stefano MARLA Bologna, PharmD, BCPS Clinical Pharmacist 06/14/2024  10:08 AM

## 2024-06-15 ENCOUNTER — Inpatient Hospital Stay (HOSPITAL_COMMUNITY)

## 2024-06-15 DIAGNOSIS — I700 Atherosclerosis of aorta: Secondary | ICD-10-CM | POA: Diagnosis not present

## 2024-06-15 DIAGNOSIS — J449 Chronic obstructive pulmonary disease, unspecified: Secondary | ICD-10-CM | POA: Diagnosis not present

## 2024-06-15 DIAGNOSIS — R6510 Systemic inflammatory response syndrome (SIRS) of non-infectious origin without acute organ dysfunction: Secondary | ICD-10-CM | POA: Diagnosis not present

## 2024-06-15 DIAGNOSIS — J439 Emphysema, unspecified: Secondary | ICD-10-CM | POA: Diagnosis not present

## 2024-06-15 LAB — GLUCOSE, CAPILLARY
Glucose-Capillary: 102 mg/dL — ABNORMAL HIGH (ref 70–99)
Glucose-Capillary: 130 mg/dL — ABNORMAL HIGH (ref 70–99)
Glucose-Capillary: 139 mg/dL — ABNORMAL HIGH (ref 70–99)
Glucose-Capillary: 140 mg/dL — ABNORMAL HIGH (ref 70–99)

## 2024-06-15 LAB — BASIC METABOLIC PANEL WITH GFR
Anion gap: 9 (ref 5–15)
BUN: 36 mg/dL — ABNORMAL HIGH (ref 8–23)
CO2: 23 mmol/L (ref 22–32)
Calcium: 8.5 mg/dL — ABNORMAL LOW (ref 8.9–10.3)
Chloride: 107 mmol/L (ref 98–111)
Creatinine, Ser: 0.48 mg/dL (ref 0.44–1.00)
GFR, Estimated: >60 mL/min (ref >60)
Glucose, Bld: 130 mg/dL — ABNORMAL HIGH (ref 70–99)
Potassium: 4.4 mmol/L (ref 3.5–5.1)
Sodium: 139 mmol/L (ref 135–145)

## 2024-06-15 LAB — URINALYSIS, ROUTINE W REFLEX MICROSCOPIC
Bilirubin Urine: NEGATIVE
Glucose, UA: NEGATIVE mg/dL
Ketones, ur: NEGATIVE mg/dL
Nitrite: NEGATIVE
Protein, ur: 30 mg/dL — AB
Specific Gravity, Urine: 1.027 (ref 1.005–1.030)
pH: 5 (ref 5.0–8.0)

## 2024-06-15 LAB — CBC
HCT: 29.4 % — ABNORMAL LOW (ref 36.0–46.0)
Hemoglobin: 9.6 g/dL — ABNORMAL LOW (ref 12.0–15.0)
MCH: 29.2 pg (ref 26.0–34.0)
MCHC: 32.7 g/dL (ref 30.0–36.0)
MCV: 89.4 fL (ref 80.0–100.0)
Platelets: 392 K/uL (ref 150–400)
RBC: 3.29 MIL/uL — ABNORMAL LOW (ref 3.87–5.11)
RDW: 14.4 % (ref 11.5–15.5)
WBC: 22 K/uL — ABNORMAL HIGH (ref 4.0–10.5)
nRBC: 0 % (ref 0.0–0.2)

## 2024-06-15 LAB — VITAMIN B12: Vitamin B-12: 489 pg/mL (ref 180–914)

## 2024-06-15 MED ORDER — IOHEXOL 300 MG/ML  SOLN
100.0000 mL | Freq: Once | INTRAMUSCULAR | Status: AC | PRN
  Administered 2024-06-15: 100 mL via INTRAVENOUS

## 2024-06-15 MED ORDER — ENSURE PLUS HIGH PROTEIN PO LIQD
237.0000 mL | Freq: Two times a day (BID) | ORAL | Status: DC
  Administered 2024-06-15 – 2024-07-08 (×30): 237 mL via ORAL

## 2024-06-15 MED ORDER — LACTATED RINGERS IV SOLN: INTRAVENOUS | Status: DC

## 2024-06-15 MED ORDER — IOHEXOL 9 MG/ML PO SOLN: 1000.0000 mL | ORAL | Status: AC

## 2024-06-15 MED ORDER — TRAVASOL 10 % IV SOLN
INTRAVENOUS | Status: AC
  Filled 2024-06-15: qty 864

## 2024-06-15 MED ORDER — IOHEXOL 9 MG/ML PO SOLN
ORAL | Status: AC
  Filled 2024-06-15: qty 1000

## 2024-06-15 NOTE — Progress Notes (Signed)
 PHARMACY - TOTAL PARENTERAL NUTRITION CONSULT NOTE   Indication: Prolonged ileus  Patient Measurements: Height: 5' 2 (157.5 cm) Weight: 66.4 kg (146 lb 6.2 oz) IBW/kg (Calculated) : 50.1 TPN AdjBW (KG): 61.3 Body mass index is 26.77 kg/m.   Assessment: Presented to ED with N/V/D and abdominal pain x 3d on 05/31/24. CT negative. Possibly from diverticulitis and placed on Cefepime /Flagyl . Concern for SBO, NGT placed. Continues to have persistent pain and distention. No significant intake since 8/21. Pharmacy consulted to start TPN on 8/26.  S/p ex lap with small bowel resection.  Glucose / Insulin : No h/o DM -CBGs mostly within goal, range 82-185 -6 units SSI/24 hrs Electrolytes: WNL, including coCa (9.4) Renal: Scr <1, BUN slightly elevated Hepatic: LFTs WNL; alb 3.1; TG 103 Intake / Output; MIVF:  -Advanced to FLD 9/1 -UOP likely not fully documented -LBM 8/31  GI Imaging: 8/19 CT: Scattered colonic diverticulosis. No active diverticulitis.  8/22, 8/23, 8/25, 8/26 Abd Xray: all SBO 8/26 CT: severely thickened and inflamed central pelvic loops of small bowel concerning for ischemia. Concern for mesenteric volvulus or closed loop obstruction. GI Surgeries / Procedures:  8/27 Ex lap with small bowel resection  Central access: PICC TPN start date: 8/26  Nutritional Goals: Goal TPN rate is 80 mL/hr (provides 86 g of protein and  1759 kcals per day)  RD Assessment: Kcal 1650-1850 Protein 75-90 g Fluid 1.8 L/day  Current Nutrition:  FLD  Plan:  -Continue TPN at goal 80 mL/hr -Electrolytes in TPN:  Na 50 mEq/L K 40 mEq/L Ca 5 mEq/L Mg 5 mEq/L Phos 15 mmol/L Cl:Ac 1:1 -Add standard MVI and trace elements to TPN -Continue Sensitive q6h SSI and adjust as needed  -Monitor TPN labs on Mon/Thurs, and PRN -Remains on FLD this morning, will continue to follow for diet advancement and ability to stop TPN   Lindsay Mason, PharmD, BCPS Clinical Pharmacist 06/15/2024  10:56 AM

## 2024-06-15 NOTE — Progress Notes (Signed)
 Occupational Therapy Treatment Patient Details Name: Lindsay Mason MRN: [id number] DOB: [birth year] Today's Date: 06/15/2024   History of present illness Pt is 73 yo female presented on 06/02/24  with + blood culture with staph species, lethargy, and worsening mental status. Pt admitted with SIRS, abdominal pain, N/V likely secondary to acute diverticulitis, and SBO vs ileus.  Pt with NG tube and surgery following. s/p exploratory lap with SB resection 2* ischemic bowel 06/09/24.   Pt also with metabolic encephalopathy on background of Alzheimer's dementia.  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 NT's and  nurse in with OT for session with patient in B mitts for line protection, removed for session and replaced. Attempted OOB with tolerance to EOB before becoming resistive with increased rigidity and low frustration tolerance to further mobility. Returned bed level for light grooming activity with assist and multimodal cueing provided. May benefit from later day sessions to decrease stim for increased participation and OT will adjust to accommodate needs for maximal outcomes.  Care plan with goals and disposition recommendations updated to reflect current functional status. Patient will benefit from continued inpatient follow up therapy, <3 hours/day.        If plan is discharge home, recommend the following:  Two people to help with walking and/or transfers;Two people to help with bathing/dressing/bathroom;Assistance with feeding;Direct supervision/assist for medications management;Assistance with cooking/housework;Direct supervision/assist for financial management;Assist for transportation;Help with stairs or ramp for entrance;Supervision  due to cognitive status   Equipment Recommendations  Other (comment) (TBD post rehab)       Precautions / Restrictions Precautions Precautions: Fall Recall of Precautions/Restrictions: Impaired Precaution/Restrictions Comments: abdominal sx, dementia Restrictions Weight Bearing Restrictions Per Provider Order: No       Mobility Bed Mobility Overal bed mobility: Needs Assistance Bed Mobility: Rolling, Sidelying to Sit, Sit to Sidelying Rolling: +2 for physical assistance, Total assist Sidelying to sit: Total assist, +2 for physical assistance, +2 for safety/equipment, HOB elevated, Used rails Supine to sit: Total assist, +2 for physical assistance, +2 for safety/equipment, HOB elevated, Used rails   Sit to sidelying: +2 for physical assistance, Total assist, +2 for safety/equipment, HOB elevated, Used rails General bed mobility comments: max encouragement for relaxation and multimodal cues with low stim presented    Transfers Overall transfer level:  (patient resistive to OOB this am with support and relaxation strategies employed)                       Balance Overall balance assessment: Needs assistance Sitting-balance support: Feet unsupported, Bilateral upper extremity supported Sitting balance-Leahy Scale: Poor Sitting balance - Comments: posterior lean                                   ADL either performed or assessed with clinical judgement   ADL Overall ADL's : Needs assistance/impaired Eating/Feeding: Minimal assistance;Bed level Eating/Feeding Details (indicate cue type and reason): now on clears Grooming: Wash/dry hands;Wash/dry face;Minimal assistance;Bed level Grooming Details (indicate cue type and reason): cues for attention and 1 step commands Upper Body Bathing: Moderate assistance;Bed level   Lower Body Bathing: Maximal assistance;+2 for physical assistance;+2 for safety/equipment;Bed level   Upper Body Dressing :  Maximal  assistance;Bed level   Lower Body Dressing: Total assistance;+2 for physical assistance;+2 for safety/equipment;Bed level   Toilet Transfer:  (EOB sitting briefly with resistance to transfer this session) Toilet Transfer Details (indicate cue type and reason): encouraged but patient resistive to trial Toileting- Clothing Manipulation and Hygiene: Total assistance;Bed level Toileting - Clothing Manipulation Details (indicate cue type and reason): Purewick in place     Functional mobility during ADLs: +2 for physical assistance;+2 for safety/equipment;Total assistance (EOB) General ADL Comments: downgraded STG's to reflect current status    Extremity/Trunk Assessment Upper Extremity Assessment Upper Extremity Assessment: Generalized weakness   Lower Extremity Assessment Lower Extremity Assessment: Defer to PT evaluation        Vision   Vision Assessment?: No apparent visual deficits         Communication Communication Communication: Impaired Factors Affecting Communication: Difficulty expressing self (some garbled speech at times when frustrated with activity presented)   Cognition Arousal: Alert Behavior During Therapy: Restless, Agitated, Impulsive, Anxious Cognition: History of cognitive impairments             OT - Cognition Comments: A/O x1, history of dementia, poor attention this session, low frustration tolerance, problem solving, decreased safety awareness, impulsive                 Following commands: Impaired Following commands impaired: Follows one step commands inconsistently      Cueing   Cueing Techniques: Verbal cues, Gestural cues, Tactile cues, Visual cues        General Comments no LE edema or SOB with 96% SpO2 on RA    Pertinent Vitals/ Pain       Pain Assessment Pain Assessment: Faces Faces Pain Scale: Hurts even more Breathing: occasional labored breathing, short period of hyperventilation Negative Vocalization: occasional  moan/groan, low speech, negative/disapproving quality Facial Expression: sad, frightened, frown Body Language: rigid, fists clenched, knees up, pushing/pulling away, strikes out Consolability: distracted or reassured by voice/touch PAINAD Score: 6 Pain Location: abdomen with movement Pain Descriptors / Indicators: Guarding, Grimacing, Tender, Sore, Moaning Pain Intervention(s): Limited activity within patient's tolerance, Monitored during session, Premedicated before session, Repositioned, Relaxation   Frequency  Min 2X/week        Progress Toward Goals  OT Goals(current goals can now be found in the care plan section)  Progress towards OT goals: Goals drowngraded-see care plan  Acute Rehab OT Goals Patient Stated Goal: patient unable to state OT Goal Formulation: Patient unable to participate in goal setting Time For Goal Achievement: 06/29/24 Potential to Achieve Goals: Fair ADL Goals Pt Will Perform Upper Body Dressing: with min assist;sitting Pt Will Perform Lower Body Dressing: with mod assist;sitting/lateral leans Pt Will Transfer to Toilet: with min assist;bedside commode;stand pivot transfer Pt Will Perform Toileting - Clothing Manipulation and hygiene: with min assist;sitting/lateral leans  Plan         AM-PAC OT 6 Clicks Daily Activity     Outcome Measure   Help from another person eating meals?: A Lot Help from another person taking care of personal grooming?: A Lot Help from another person toileting, which includes using toliet, bedpan, or urinal?: Total Help from another person bathing (including washing, rinsing, drying)?: Total Help from another person to put on and taking off regular upper body clothing?: A Lot Help from another person to put on and taking off regular lower body clothing?: Total 6 Click Score: 9    End of Session Equipment Utilized During Treatment: Gait belt  OT Visit  Diagnosis: Unsteadiness on feet (R26.81);Other abnormalities of  gait and mobility (R26.89);Muscle weakness (generalized) (M62.81);Feeding difficulties (R63.3);Other symptoms and signs involving cognitive function;Cognitive communication deficit (R41.841);Pain Pain - part of body:  (abdomen)   Activity Tolerance Patient limited by fatigue;Patient limited by lethargy   Patient Left in bed;with call bell/phone within reach;with bed alarm set;with nursing/sitter in room;with SCD's reapplied;Other (comment) (B mitts reapplied due to line protection)   Nurse Communication Mobility status;Other (comment) (nursing present for assist)        Time: [phone] OT Time Calculation (min): 31 min  Charges: OT General Charges $OT Visit: 1 Visit OT Treatments $Therapeutic Activity: 23-37 mins  Orelia Brandstetter OT/L Acute Rehabilitation Department  [phone]  06/15/2024, 10:27 AM

## 2024-06-15 NOTE — Progress Notes (Signed)
 6 Days Post-Op   Subjective/Chief Complaint: Tolerated full liquids Having flatus/BM Walked a little in the room with assistance  Objective: Vital signs in last 24 hours: Temp:  [98.5 F (36.9 C)-98.9 F (37.2 C)] 98.9 F (37.2 C) (09/02 0400) Pulse Rate:  [102-111] 102 (09/02 0431) Resp:  [18-19] 18 (09/02 0400) BP: (117-129)/(83-90) 129/83 (09/02 0400) SpO2:  [95 %-100 %] 97 % (09/02 0400) Last BM Date : 06/13/24  Intake/Output from previous day: 09/01 0701 - 09/02 0700 In: 2437.7 [I.V.:1932.3; IV Piggyback:505.3] Out: 600 [Urine:600] Intake/Output this shift: Total I/O In: 30 [P.O.:30] Out: -   Exam: Awake and alert, at baseline Abdomen is soft, not significantly tender nor distended, incision clean with packing in place  Lab Results:  Recent Labs    06/13/24 0545 06/14/24 0600  WBC 11.8* 11.4*  HGB 10.0* 11.0*  HCT 30.9* 35.0*  PLT 297 416*   BMET Recent Labs    06/13/24 0545 06/14/24 0600  NA 141 140  K 4.1 4.4  CL 104 105  CO2 28 25  GLUCOSE 110* 132*  BUN 26* 24*  CREATININE 0.39* 0.38*  CALCIUM  8.4* 8.7*   PT/INR No results for input(s): LABPROT, INR in the last 72 hours. ABG No results for input(s): PHART, HCO3 in the last 72 hours.  Invalid input(s): PCO2, PO2  Studies/Results: No results found.  Anti-infectives: Anti-infectives (From admission, onward)    Start     Dose/Rate Route Frequency Ordered Stop   06/10/24 0300  ceFEPIme  (MAXIPIME ) 2 g in sodium chloride  0.9 % 100 mL IVPB        2 g 200 mL/hr over 30 Minutes Intravenous Every 12 hours 06/09/24 1732 06/17/24 0259   06/09/24 2100  metroNIDAZOLE  (FLAGYL ) IVPB 500 mg        500 mg 100 mL/hr over 60 Minutes Intravenous Every 12 hours 06/09/24 1725 06/17/24 0359   06/09/24 1730  ceFEPIme  (MAXIPIME ) 1 g in sodium chloride  0.9 % 100 mL IVPB  Status:  Discontinued        1 g 200 mL/hr over 30 Minutes Intravenous Every 12 hours 06/09/24 1725 06/09/24 1731   06/09/24  1500  ceFEPIme  (MAXIPIME ) 2 g in sodium chloride  0.9 % 100 mL IVPB        2 g 200 mL/hr over 30 Minutes Intravenous  Once 06/09/24 1454 06/09/24 1743   06/03/24 1130  vancomycin  (VANCOCIN ) IVPB 1000 mg/200 mL premix  Status:  Discontinued        1,000 mg 200 mL/hr over 60 Minutes Intravenous Every 24 hours 06/02/24 1149 06/03/24 0949   06/03/24 0000  ceFEPIme  (MAXIPIME ) 2 g in sodium chloride  0.9 % 100 mL IVPB        2 g 200 mL/hr over 30 Minutes Intravenous Every 12 hours 06/02/24 1149 06/08/24 1553   06/02/24 2200  metroNIDAZOLE  (FLAGYL ) IVPB 500 mg  Status:  Discontinued        500 mg 100 mL/hr over 60 Minutes Intravenous Every 12 hours 06/02/24 1113 06/09/24 1730   06/02/24 1100  ceFEPIme  (MAXIPIME ) 2 g in sodium chloride  0.9 % 100 mL IVPB        2 g 200 mL/hr over 30 Minutes Intravenous  Once 06/02/24 1045 06/02/24 1449   06/02/24 1100  vancomycin  (VANCOCIN ) IVPB 1000 mg/200 mL premix        1,000 mg 200 mL/hr over 60 Minutes Intravenous  Once 06/02/24 1045 06/02/24 1233   06/02/24 1100  metroNIDAZOLE  (FLAGYL ) IVPB 500 mg  500 mg 100 mL/hr over 60 Minutes Intravenous  Once 06/02/24 1045 06/02/24 1818       Assessment/Plan: POD# 6 s/p diagnostic laparoscopy, exp laparotomy, small bowel resection for small bowel perf/abscess  -continue wound care -PT -Soft diet -Therapies -repeat CT if WBC increases   LOS: 13 days    Lonni CHRISTELLA Pizza 06/15/2024

## 2024-06-15 NOTE — NC FL2 (Signed)
 [place]  MEDICAID [id number] LEVEL OF CARE FORM     IDENTIFICATION  Patient Name: Lindsay Mason Birthdate: 1950/12/15 Sex: female Admission Date (Current Location): 06/02/2024  [facility] [place] Number:  Producer, television/film/video and Address:         Provider Number: [id number]  Attending Physician Name and Address:  Madelyne Owen DELENA, MD  Relative Name and Phone Number:  Rhonna Holster [phone]    Current Level of Care: Hospital Recommended Level of Care: Skilled Nursing Facility Prior Approval Number:    Date Approved/Denied:   PASRR Number: [id number]  Discharge Plan: SNF    Current Diagnoses: Patient Active Problem List   Diagnosis Date Noted   SIRS (systemic inflammatory response syndrome) (HCC) 06/02/2024   Positive blood culture 06/02/2024   Nausea and vomiting 06/02/2024   Abdominal pain 06/02/2024   Hyperlipidemia 06/02/2024   Abnormal EKG 06/02/2024   Transaminitis 05/09/2024   Confusion and disorientation 03/02/2024   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 03/02/2024   Fall with injury 02/05/2024   Multiple closed fractures of ribs of left side with routine healing 02/05/2024   Urinary retention 02/05/2024   Non-small cell carcinoma of left lung, stage 1 (HCC) 01/20/2024   Pneumothorax 01/10/2024   PAD (peripheral artery disease) (HCC) 12/06/2023   UTI due to extended-spectrum beta lactamase (ESBL) producing Escherichia coli 12/06/2023   Acid reflux 11/06/2023   Sundowning 11/06/2023   Medicare annual wellness visit, subsequent 05/01/2022   Vitamin B12 deficiency 01/22/2021   History of essential hypertension 01/21/2021   Severe early onset Alzheimer dementia (HCC) 01/21/2021   History of primary malignant neoplasm of right lung 01/20/2020   S/P lobectomy of lung 01/20/2020   Advanced care planning/counseling discussion 06/26/2017   Degenerative scoliosis 06/26/2017   COPD with emphysema (HCC) 02/12/2016   Thoracic aortic  atherosclerosis (HCC) 02/12/2016   Health maintenance examination 02/01/2016   Weight loss 02/01/2016   Microscopic colitis 06/05/2015   Abnormal MRI of head 10/02/2011   Headache 08/06/2011   Ex-smoker 04/30/2007   Osteopenia 04/30/2007    Orientation RESPIRATION BLADDER Height & Weight     Self  Normal Continent Weight: 66.4 kg Height:  5' 2 (157.5 cm)  BEHAVIORAL SYMPTOMS/MOOD NEUROLOGICAL BOWEL NUTRITION STATUS      Incontinent Diet, TNA (Has PICC-TPN)  AMBULATORY STATUS COMMUNICATION OF NEEDS Skin   Limited Assist Verbally Normal                       Personal Care Assistance Level of Assistance  Bathing, Feeding, Dressing Bathing Assistance: Limited assistance Feeding assistance: Limited assistance Dressing Assistance: Limited assistance     Functional Limitations Info  Sight, Hearing, Speech Sight Info: Adequate Hearing Info: Adequate Speech Info: Adequate    SPECIAL CARE FACTORS FREQUENCY  PT (By licensed PT), OT (By licensed OT)     PT Frequency: 5x week OT Frequency: 5x week            Contractures Contractures Info: Not present    Additional Factors Info  Code Status, Allergies Code Status Info: Full Allergies Info: Donepezil ;Pencillins           Current Medications (06/15/2024):  This is the current hospital active medication list Current Facility-Administered Medications  Medication Dose Route Frequency Provider Last Rate Last Admin   albuterol  (PROVENTIL ) (2.5 MG/3ML) 0.083% nebulizer solution 2.5 mg  2.5 mg Nebulization Q6H PRN Curvin Mt III, MD   2.5 mg at 06/04/24 1730  ceFEPIme  (MAXIPIME ) 2 g in sodium chloride  0.9 % 100 mL IVPB  2 g Intravenous Q12H Bethene Stefano POUR, RPH 200 mL/hr at 06/15/24 9786 2 g at 06/15/24 9786   Chlorhexidine  Gluconate Cloth 2 % PADS 6 each  6 each Topical Daily Curvin Mt III, MD   6 each at 06/15/24 9071   enoxaparin  (LOVENOX ) injection 40 mg  40 mg Subcutaneous Q24H Tammy Sor, PA-C   40 mg at  06/15/24 1504   feeding supplement (BOOST / RESOURCE BREEZE) liquid 1 Container  1 Container Oral TID BM Pokhrel, Laxman, MD   1 Container at 06/15/24 1500   feeding supplement (ENSURE PLUS HIGH PROTEIN) liquid 237 mL  237 mL Oral BID BM Regalado, Belkys A, MD   237 mL at 06/15/24 1500   haloperidol  lactate (HALDOL ) injection 1 mg  1 mg Intravenous Q6H PRN Pokhrel, Laxman, MD   1 mg at 06/10/24 1627   hydrALAZINE  (APRESOLINE ) injection 10 mg  10 mg Intravenous Q6H PRN Pokhrel, Laxman, MD       insulin  aspart (novoLOG ) injection 0-9 Units  0-9 Units Subcutaneous Q6H Curvin Mt III, MD   1 Units at 06/15/24 1232   iohexol  (OMNIPAQUE ) 9 MG/ML oral solution 1,000 mL  1,000 mL Oral Q1H Regalado, Belkys A, MD       lactated ringers  infusion   Intravenous Continuous Regalado, Belkys A, MD       LORazepam  (ATIVAN ) injection 0.5 mg  0.5 mg Intravenous Once Toth, Paul III, MD       methocarbamol  (ROBAXIN ) injection 500 mg  500 mg Intravenous Q8H Tammy Sor, PA-C   500 mg at 06/15/24 1508   metroNIDAZOLE  (FLAGYL ) IVPB 500 mg  500 mg Intravenous Q12H Curvin Mt III, MD 100 mL/hr at 06/15/24 1512 500 mg at 06/15/24 1512   morphine  (PF) 2 MG/ML injection 1-4 mg  1-4 mg Intravenous Q1H PRN Curvin Mt III, MD   2 mg at 06/15/24 1501   ondansetron  (ZOFRAN ) injection 4 mg  4 mg Intravenous Q6H PRN Curvin Mt III, MD   4 mg at 06/14/24 0643   phenol (CHLORASEPTIC) mouth spray 1 spray  1 spray Mouth/Throat PRN Curvin Mt III, MD   1 spray at 06/07/24 2145   rivastigmine  (EXELON ) 9.5 mg/24hr 9.5 mg  9.5 mg Transdermal Daily Curvin Mt III, MD   9.5 mg at 06/15/24 9074   sodium chloride  flush (NS) 0.9 % injection 10-40 mL  10-40 mL Intracatheter Q12H Curvin Mt III, MD   10 mL at 06/15/24 9072   sodium chloride  flush (NS) 0.9 % injection 10-40 mL  10-40 mL Intracatheter PRN Curvin Mt MOULD, MD       TPN ADULT (ION)   Intravenous Continuous TPN Ellington, Abby K, RPH 80 mL/hr at 06/14/24 1815 New Bag at 06/14/24 1815    TPN ADULT (ION)   Intravenous Continuous TPN Ellington, Abby K, RPH         Discharge Medications: Please see discharge summary for a list of discharge medications.  Relevant Imaging Results:  Relevant Lab Results:   Additional Information ss#[id number]  Aidenn Skellenger, [place], [place]

## 2024-06-15 NOTE — Plan of Care (Signed)
  Problem: Clinical Measurements: Goal: Diagnostic test results will improve Outcome: Progressing Goal: Signs and symptoms of infection will decrease Outcome: Progressing   Problem: Clinical Measurements: Goal: Ability to maintain clinical measurements within normal limits will improve Outcome: Progressing Goal: Will remain free from infection Outcome: Progressing Goal: Diagnostic test results will improve Outcome: Progressing   Problem: Activity: Goal: Risk for activity intolerance will decrease Outcome: Progressing   Problem: Nutrition: Goal: Adequate nutrition will be maintained Outcome: Progressing   Problem: Elimination: Goal: Will not experience complications related to bowel motility Outcome: Progressing Goal: Will not experience complications related to urinary retention Outcome: Progressing

## 2024-06-15 NOTE — Progress Notes (Addendum)
 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  Patient had lethargy and worsening mental status.  In the ED patient had slightly low blood pressure.  White blood cell was elevated at 13.  CT abdomen and pelvis done on 06/01/2024 with no acute finding but is scattered colonic diverticulosis with a small diverticulitis.  Patient was then considered for admission to the hospital for further evaluation and treatment.  During hospitalization patient continued to have abdominal distention and pain, general surgery was consulted.  Currently being treated for small bowel obstruction/ileus initially conservatively but since she did not had any improvement and had a significant CT finding underwent exploratory laparotomy with a small bowel resection 06/09/2024.  Patient is currently on TPN and awaiting for clinical improvement.   Assessment & Plan:   Principal Problem:   SIRS (systemic inflammatory response syndrome) (HCC) Active Problems:   COPD with emphysema (HCC)   Thoracic aortic atherosclerosis (HCC)   History of essential hypertension   Severe early onset Alzheimer dementia (HCC)   PAD (peripheral artery disease) (HCC)   Confusion and disorientation   Positive blood culture   Nausea and vomiting   Abdominal pain   Hyperlipidemia   Abnormal EKG  Abdominal pain/  Nausea and vomiting: Sepsis likely secondary to acute diverticulitis Small bowel obstruction/ileus Patient initially had leukocytosis with hypotension and lethargy and clinical suspicion for diverticulitis suggestive of possible  sepsis.  Initial CT scan of the abdomen initially was negative for acute findings but had clinical symptoms of diverticulitis.   -Continue with cefepime  and metronidazole .  Blood cultures negative in  5 days.  -General surgery was consulted due to persistent abdominal distention and pain.  Initial imaging with x-ray of the abdomen showed possible SBO/ileus.  Patient was then started on conservative treatment with NG tube IV fluid with electrolyte replacement but continued to have pain and distention with NG output.  Repeat CT scan done 06/08/2024 showed significant thickening of the pelvic loops of the small bowel concerning for ischemia. -  Patient on underwent diagnostic laparoscopy, exploratory laparotomy with small bowel resection on 06/09/2024.  Was on NG tube which has come off at this time.  -On TPN, Full liquid diet.   WBC increased to 20, still on IV cefepime  and flagyl . Message sent to surgical team.   Hypophosphatemia. Replaced.    Hypokalemia. Replaced.    Metabolic Encephalopathy on the background of Alzheimer's dementia.   Likely secondary to infection.  On nortriptyline  trazodone  Exelon  at home..  On mittens and one-to-one sitter. Delirium precaution.   Positive blood culture with staph species. Contamination suspected.   COPD with emphysema  Continue albuterol  inhaler.   Hyperlipidemia PAD (peripheral artery disease)  Thoracic aortic atherosclerosis -Holding Lipitor   History of essential hypertension On as needed IV hydralazine    Weakness, deconditioning.  Seen by PT OT and recommended skilled nursing facility placement      Nutrition Problem: Inadequate oral intake Etiology:  (PSBO)    Signs/Symptoms: NPO status    Interventions: TPN  Estimated body mass index is 26.77 kg/m as calculated from the following:   Height as of this encounter: 5' 2 (1.575 m).   Weight  as of this encounter: 66.4 kg.   DVT prophylaxis: Lovenox  Code Status: Full code Family  Communication: Son at bedside, updated Disposition Plan:  Status is: Inpatient Remains inpatient appropriate because: management o post op     Consultants:  Surgery   Procedures:    Antimicrobials:    Subjective: Open eyes to voice, fall back to sleep. Denies pain.    Objective: Vitals:   06/14/24 1900 06/15/24 0400 06/15/24 0431 06/15/24 1152  BP: (!) 117/90 129/83  127/82  Pulse: (!) 102 (!) 111 (!) 102   Resp: 18 18  20   Temp: 98.5 F (36.9 C) 98.9 F (37.2 C)  98.6 F (37 C)  TempSrc: Axillary Axillary  Oral  SpO2: 100% 97%  97%  Weight:      Height:        Intake/Output Summary (Last 24 hours) at 06/15/2024 1427 Last data filed at 06/15/2024 9096 Gross per 24 hour  Intake 1843.7 ml  Output 600 ml  Net 1243.7 ml   Filed Weights   06/12/24 0655 06/13/24 0630 06/14/24 0548  Weight: 66.4 kg 66.7 kg 66.4 kg    Examination:  General exam: Sleepy  Respiratory system: CTA Cardiovascular system: S 1, S 2 RRR Gastrointestinal system: BS present, soft,  mid abdomen with dressing Central nervous system: Sleepy  Extremities: no edema   Data Reviewed: I have personally reviewed following labs and imaging studies  CBC: Recent Labs  Lab 06/09/24 0208 06/11/24 0311 06/13/24 0545 06/14/24 0600 06/15/24 1206  WBC 12.1* 15.2* 11.8* 11.4* 22.0*  HGB 11.9* 11.2* 10.0* 11.0* 9.6*  HCT 37.4 34.5* 30.9* 35.0* 29.4*  MCV 90.1 90.6 90.1 89.5 89.4  PLT 318 300 297 416* 392   Basic Metabolic Panel: Recent Labs  Lab 06/09/24 0208 06/10/24 0332 06/11/24 0311 06/13/24 0545 06/14/24 0600 06/15/24 1206  NA 144 140 139 141 140 139  K 3.5 4.1 4.1 4.1 4.4 4.4  CL 105 103 102 104 105 107  CO2 27 24 28 28 25 23   GLUCOSE 113* 181* 123* 110* 132* 130*  BUN <5* 9 16 26* 24* 36*  CREATININE 0.40* 0.43* 0.46 0.39* 0.38* 0.48  CALCIUM  8.1* 8.1* 8.3* 8.4* 8.7* 8.5*  MG 2.0 1.9 2.1 2.2 2.2  --   PHOS 2.3* 2.3* 2.8 3.2 3.3  --    GFR: Estimated Creatinine Clearance:  56 mL/min (by C-G formula based on SCr of 0.48 mg/dL). Liver Function Tests: Recent Labs  Lab 06/09/24 0208 06/10/24 0332 06/14/24 0600  AST 24 22 33  ALT 17 15 20   ALKPHOS 74 59 73  BILITOT 0.3 0.3 0.3  PROT 5.0* 5.0* 5.3*  ALBUMIN  2.9* 3.3* 3.1*   No results for input(s): LIPASE, AMYLASE in the last 168 hours. No results for input(s): AMMONIA in the last 168 hours. Coagulation Profile: No results for input(s): INR, PROTIME in the last 168 hours. Cardiac Enzymes: No results for input(s): CKTOTAL, CKMB, CKMBINDEX, TROPONINI in the last 168 hours. BNP (last 3 results) No results for input(s): PROBNP in the last 8760 hours. HbA1C: No results for input(s): HGBA1C in the last 72 hours. CBG: Recent Labs  Lab 06/14/24 1224 06/14/24 1808 06/14/24 2307 06/15/24 0510 06/15/24 1148  GLUCAP 185* 154* 148* 139* 130*   Lipid Profile: Recent Labs    06/14/24 0600  TRIG 103   Thyroid  Function Tests: No results for input(s): TSH, T4TOTAL, FREET4, T3FREE, THYROIDAB in the last 72 hours. Anemia Panel: Recent Labs    06/15/24 [id number]  VITAMINB12 489   Sepsis Labs: No results for input(s): PROCALCITON, LATICACIDVEN in the last 168 hours.  No results found for this or any previous visit (from the past 240 hours).       Radiology Studies: No results found.      Scheduled Meds:  Chlorhexidine  Gluconate Cloth  6 each Topical Daily   enoxaparin  (LOVENOX ) injection  40 mg Subcutaneous Q24H   feeding supplement  1 Container Oral TID BM   feeding supplement  237 mL Oral BID BM   insulin  aspart  0-9 Units Subcutaneous Q6H   LORazepam   0.5 mg Intravenous Once   methocarbamol  (ROBAXIN ) injection  500 mg Intravenous Q8H   rivastigmine   9.5 mg Transdermal Daily   sodium chloride  flush  10-40 mL Intracatheter Q12H   Continuous Infusions:  ceFEPime  (MAXIPIME ) IV 2 g (06/15/24 0213)   metronidazole  500 mg (06/15/24 0329)   TPN ADULT (ION) 80  mL/hr at 06/14/24 1815   TPN ADULT (ION)       LOS: 13 days    Time spent: 35 minutes    Braysen Cloward A Janiyha Montufar, MD [facility]   If 7PM-7AM, please contact night-coverage [email or web address]  06/15/2024, 2:27 PM

## 2024-06-15 NOTE — Progress Notes (Signed)
 PT Cancellation Note  Patient Details Name: NURIA PHEBUS MRN: [id number] DOB: 18-Nov-1950   Cancelled Treatment:     Unable to re direct behavior for Pt to participate.  LPT has rec SNF.  Rehab Team to continue to follow during Pt's Acute Care stay and attempt to see another day.  Katheryn Leap  PTA Acute  Rehabilitation Services Office M-F          [phone]

## 2024-06-15 NOTE — TOC Progression Note (Signed)
 Transition of Care [facility]) - Progression Note    Patient Details  Name: Lindsay Mason MRN: [id number] Date of Birth: 22-Jan-1951  Transition of Care [facility]) CM/SW Contact  Tahlor Berenguer, Nathanel, RN Phone Number: 06/15/2024, 2:36 PM  Clinical Narrative: Patient w/dementia.Per [name] dtr voicememail left text message for a call back. Await call back to discuss PT recc ST SNF.      Expected Discharge Plan: Skilled Nursing Facility Barriers to Discharge: Continued Medical Work up               Expected Discharge Plan and Services In-house Referral: Clinical Social Work Discharge Planning Services: CM Consult                     DME Arranged: N/A DME Agency: NA                   Social Drivers of Health (SDOH) Interventions SDOH Screenings   Food Insecurity: No Food Insecurity (06/02/2024)  Housing: Low Risk  (06/02/2024)  Transportation Needs: No Transportation Needs (06/02/2024)  Utilities: Not At Risk (06/02/2024)  Alcohol Screen: Low Risk  (05/05/2024)  Depression (PHQ2-9): High Risk (05/07/2024)  Financial Resource Strain: Low Risk  (05/05/2024)  Physical Activity: Sufficiently Active (05/05/2024)  Social Connections: Moderately Isolated (06/02/2024)  Stress: No Stress Concern Present (05/05/2024)  Tobacco Use: Medium Risk (06/09/2024)  Health Literacy: Inadequate Health Literacy (05/05/2024)    Readmission Risk Interventions    06/14/2024    2:25 PM 01/11/2024   10:01 AM  Readmission Risk Prevention Plan  Post Dischage Appt  Complete  Medication Screening  Complete  Transportation Screening Complete Complete  Medication Review Oceanographer) Complete   HRI or Home Care Consult Complete   SW Recovery Care/Counseling Consult Complete   Palliative Care Screening Not Applicable   Skilled Nursing Facility Complete

## 2024-06-15 NOTE — Plan of Care (Signed)
  Problem: Clinical Measurements: Goal: Diagnostic test results will improve Outcome: Progressing Goal: Signs and symptoms of infection will decrease Outcome: Progressing   Problem: Clinical Measurements: Goal: Ability to maintain clinical measurements within normal limits will improve Outcome: Progressing Goal: Will remain free from infection Outcome: Progressing   Problem: Nutrition: Goal: Adequate nutrition will be maintained Outcome: Progressing   Problem: Safety: Goal: Ability to remain free from injury will improve Outcome: Progressing

## 2024-06-15 NOTE — TOC Initial Note (Signed)
 Transition of Care [facility]) - Initial/Assessment Note    Patient Details  Name: Lindsay Mason MRN: [id number] Date of Birth: 1951-03-26  Transition of Care [facility]) CM/SW Contact:    Bascom Service, RN Phone Number: 06/15/2024, 4:16 PM  Clinical Narrative: Beatris to [name](dtr) about PT recc ST SNF-prefer [facility], or [facility]-faxed out await bed offers prior auth.                  Expected Discharge Plan: Skilled Nursing Facility Barriers to Discharge: Continued Medical Work up   Patient Goals and CMS Choice Patient states their goals for this hospitalization and ongoing recovery are:: ST SNF CMS Medicare.gov Compare Post Acute Care list provided to:: Patient Represenative (must comment) ([name](dtr)) Choice offered to / list presented to : Adult Children [place] ownership interest in [facility].provided to:: Adult Children    Expected Discharge Plan and Services In-house Referral: Clinical Social Work Discharge Planning Services: CM Consult Post Acute Care Choice: Skilled Nursing Facility                   DME Arranged: N/A DME Agency: NA                  Prior Living Arrangements/Services     Patient language and need for interpreter reviewed:: Yes Do you feel safe going back to the place where you live?: Yes      Need for Family Participation in Patient Care: Yes (Comment) Care giver support system in place?: Yes (comment)   Criminal Activity/Legal Involvement Pertinent to Current Situation/Hospitalization: No - Comment as needed  Activities of Daily Living   ADL Screening (condition at time of admission) Independently performs ADLs?: No Does the patient have a NEW difficulty with bathing/dressing/toileting/self-feeding that is expected to last >3 days?: Yes (Initiates electronic notice to provider for possible OT consult) Does the patient have a NEW difficulty with getting in/out of bed, walking, or climbing stairs that is expected to last >3 days?:  Yes (Initiates electronic notice to provider for possible PT consult) Does the patient have a NEW difficulty with communication that is expected to last >3 days?: Yes (Initiates electronic notice to provider for possible SLP consult) Is the patient deaf or have difficulty hearing?: No Does the patient have difficulty seeing, even when wearing glasses/contacts?: No Does the patient have difficulty concentrating, remembering, or making decisions?: Yes  Permission Sought/Granted                  Emotional Assessment              Admission diagnosis:  Diverticulitis [K57.92] SIRS (systemic inflammatory response syndrome) (HCC) [R65.10] Patient Active Problem List   Diagnosis Date Noted   SIRS (systemic inflammatory response syndrome) (HCC) 06/02/2024   Positive blood culture 06/02/2024   Nausea and vomiting 06/02/2024   Abdominal pain 06/02/2024   Hyperlipidemia 06/02/2024   Abnormal EKG 06/02/2024   Transaminitis 05/09/2024   Confusion and disorientation 03/02/2024   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 03/02/2024   Fall with injury 02/05/2024   Multiple closed fractures of ribs of left side with routine healing 02/05/2024   Urinary retention 02/05/2024   Non-small cell carcinoma of left lung, stage 1 (HCC) 01/20/2024   Pneumothorax 01/10/2024   PAD (peripheral artery disease) (HCC) 12/06/2023   UTI due to extended-spectrum beta lactamase (ESBL) producing Escherichia coli 12/06/2023   Acid reflux 11/06/2023   Sundowning 11/06/2023   Medicare annual wellness  visit, subsequent 05/01/2022   Vitamin B12 deficiency 01/22/2021   History of essential hypertension 01/21/2021   Severe early onset Alzheimer dementia (HCC) 01/21/2021   History of primary malignant neoplasm of right lung 01/20/2020   S/P lobectomy of lung 01/20/2020   Advanced care planning/counseling discussion 06/26/2017   Degenerative scoliosis 06/26/2017   COPD with emphysema (HCC) 02/12/2016    Thoracic aortic atherosclerosis (HCC) 02/12/2016   Health maintenance examination 02/01/2016   Weight loss 02/01/2016   Microscopic colitis 06/05/2015   Abnormal MRI of head 10/02/2011   Headache 08/06/2011   Ex-smoker 04/30/2007   Osteopenia 04/30/2007   PCP:  [name], MD Pharmacy:   [facility] [phone] - [address], [place] - [address] [place] [address] Phone: [phone] Fax: [phone]  [facility] [address] [place] [address] Phone: [phone] Fax: [phone]     Social Drivers of Health (SDOH) Social History: SDOH Screenings   Food Insecurity: No Food Insecurity (06/02/2024)  Housing: Low Risk  (06/02/2024)  Transportation Needs: No Transportation Needs (06/02/2024)  Utilities: Not At Risk (06/02/2024)  Alcohol Screen: Low Risk  (05/05/2024)  Depression (PHQ2-9): High Risk (05/07/2024)  Financial Resource Strain: Low Risk  (05/05/2024)  Physical Activity: Sufficiently Active (05/05/2024)  Social Connections: Moderately Isolated (06/02/2024)  Stress: No Stress Concern Present (05/05/2024)  Tobacco Use: Medium Risk (06/09/2024)  Health Literacy: Inadequate Health Literacy (05/05/2024)   SDOH Interventions:     Readmission Risk Interventions    06/14/2024    2:25 PM 01/11/2024   10:01 AM  Readmission Risk Prevention Plan  Post Dischage Appt  Complete  Medication Screening  Complete  Transportation Screening Complete Complete  Medication Review Oceanographer) Complete   HRI or Home Care Consult Complete   SW Recovery Care/Counseling Consult Complete   Palliative Care Screening Not Applicable   Skilled Nursing Facility Complete

## 2024-06-15 NOTE — Progress Notes (Signed)
 This RN at bedside for TPN/routine line care. Patient care needed, unable to change dressing at this time. Primary RN/tech also trying to take patient to CT after care completed. Will follow up when patient is available again for change.

## 2024-06-15 NOTE — Progress Notes (Signed)
 Nutrition Follow-up  INTERVENTION:   -TPN management per Pharmacy -Daily weights while on TPN  -Boost Breeze po TID, each supplement provides 250 kcal and 9 grams of protein   -Ensure Plus High Protein po BID, each supplement provides 350 kcal and 20 grams of protein.   NUTRITION DIAGNOSIS:   Inadequate oral intake related to  (PSBO) as evidenced by NPO status.  Now on soft diet.  GOAL:   Patient will meet greater than or equal to 90% of their needs  Meeting.  MONITOR:   Labs (TPN)  ASSESSMENT:   73 y.o. 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 Currently being treated for small bowel obstruction/ileus with NG tube and IV fluids.  8/20: admitted  8/24: NGT placed for suction 8/26: TPN initiated via PICC 8/27: s/p ex lap, SB resection 8/30: CLD 8/31: NGT removed 9/1: FLD 9/2: Soft diet   Patient now on soft diet. Was taking in ~20% of full liquid trays yesterday. Have added Ensure supplements for additional kcals and protein. Accepting Boost Breeze already.   TPN to continue at goal rate of 80 ml/hr, providing 1759 kcals and 86g protein.  Admission weight: 135 lbs Current weight: 146 lbs  Medications reviewed.  Labs reviewed: CBGs: 130-185 TG 103  Diet Order:   Diet Order             DIET SOFT Fluid consistency: Thin  Diet effective now                   EDUCATION NEEDS:   No education needs have been identified at this time  Skin:  Skin Assessment: Skin Integrity Issues: Skin Integrity Issues:: Incisions Incisions: 8/27 abdomen  Last BM:  9/1 -type 6  Height:   Ht Readings from Last 1 Encounters:  06/02/24 5' 2 (1.575 m)    Weight:   Wt Readings from  Last 1 Encounters:  06/14/24 66.4 kg    BMI:  Body mass index is 26.77 kg/m.  Estimated Nutritional Needs:   Kcal:  1650-1850  Protein:  75-90g  Fluid:  1.8L/day   Morna Lee, MS, RD, LDN Inpatient Clinical Dietitian Contact via Secure chat

## 2024-06-16 ENCOUNTER — Inpatient Hospital Stay (HOSPITAL_COMMUNITY)

## 2024-06-16 DIAGNOSIS — I739 Peripheral vascular disease, unspecified: Secondary | ICD-10-CM | POA: Diagnosis not present

## 2024-06-16 DIAGNOSIS — E785 Hyperlipidemia, unspecified: Secondary | ICD-10-CM | POA: Diagnosis not present

## 2024-06-16 DIAGNOSIS — R6510 Systemic inflammatory response syndrome (SIRS) of non-infectious origin without acute organ dysfunction: Secondary | ICD-10-CM | POA: Diagnosis not present

## 2024-06-16 DIAGNOSIS — J449 Chronic obstructive pulmonary disease, unspecified: Secondary | ICD-10-CM | POA: Diagnosis not present

## 2024-06-16 LAB — LACTIC ACID, PLASMA: Lactic Acid, Venous: 0.8 mmol/L (ref 0.5–1.9)

## 2024-06-16 LAB — CBC
HCT: 25.9 % — ABNORMAL LOW (ref 36.0–46.0)
Hemoglobin: 8.3 g/dL — ABNORMAL LOW (ref 12.0–15.0)
MCH: 29 pg (ref 26.0–34.0)
MCHC: 32 g/dL (ref 30.0–36.0)
MCV: 90.6 fL (ref 80.0–100.0)
Platelets: 359 10*3/uL (ref 150–400)
RBC: 2.86 MIL/uL — ABNORMAL LOW (ref 3.87–5.11)
RDW: 14.6 % (ref 11.5–15.5)
WBC: 20.2 10*3/uL — ABNORMAL HIGH (ref 4.0–10.5)
nRBC: 0 % (ref 0.0–0.2)

## 2024-06-16 LAB — BASIC METABOLIC PANEL WITH GFR
Anion gap: 9 (ref 5–15)
BUN: 31 mg/dL — ABNORMAL HIGH (ref 8–23)
CO2: 21 mmol/L — ABNORMAL LOW (ref 22–32)
Calcium: 8.2 mg/dL — ABNORMAL LOW (ref 8.9–10.3)
Chloride: 109 mmol/L (ref 98–111)
Creatinine, Ser: 0.34 mg/dL — ABNORMAL LOW (ref 0.44–1.00)
GFR, Estimated: >60 mL/min
Glucose, Bld: 114 mg/dL — ABNORMAL HIGH (ref 70–99)
Potassium: 3.8 mmol/L (ref 3.5–5.1)
Sodium: 139 mmol/L (ref 135–145)

## 2024-06-16 LAB — GLUCOSE, CAPILLARY
Glucose-Capillary: 107 mg/dL — ABNORMAL HIGH (ref 70–99)
Glucose-Capillary: 111 mg/dL — ABNORMAL HIGH (ref 70–99)
Glucose-Capillary: 116 mg/dL — ABNORMAL HIGH (ref 70–99)
Glucose-Capillary: 65 mg/dL — ABNORMAL LOW (ref 70–99)

## 2024-06-16 LAB — PROTIME-INR
INR: 1.4 — ABNORMAL HIGH (ref 0.8–1.2)
Prothrombin Time: 18.4 s — ABNORMAL HIGH (ref 11.4–15.2)

## 2024-06-16 LAB — MRSA NEXT GEN BY PCR, NASAL: MRSA by PCR Next Gen: NOT DETECTED

## 2024-06-16 MED ORDER — DEXTROSE 50 % IV SOLN
12.5000 g | INTRAVENOUS | Status: AC
  Administered 2024-06-16: 12.5 g via INTRAVENOUS
  Filled 2024-06-16: qty 50

## 2024-06-16 MED ORDER — MIDAZOLAM HCL 2 MG/2ML IJ SOLN
INTRAMUSCULAR | Status: AC
  Filled 2024-06-16: qty 4

## 2024-06-16 MED ORDER — MIDAZOLAM HCL 2 MG/2ML IJ SOLN
INTRAMUSCULAR | Status: AC | PRN
  Administered 2024-06-16: 1 mg via INTRAVENOUS

## 2024-06-16 MED ORDER — TRAVASOL 10 % IV SOLN
INTRAVENOUS | Status: AC
  Filled 2024-06-16: qty 864

## 2024-06-16 MED ORDER — SODIUM CHLORIDE 0.9 % IV SOLN
1.0000 g | Freq: Three times a day (TID) | INTRAVENOUS | Status: DC
  Administered 2024-06-16 – 2024-06-23 (×21): 1 g via INTRAVENOUS
  Filled 2024-06-16 (×22): qty 20

## 2024-06-16 MED ORDER — FENTANYL CITRATE (PF) 100 MCG/2ML IJ SOLN
INTRAMUSCULAR | Status: AC | PRN
  Administered 2024-06-16: 50 ug via INTRAVENOUS

## 2024-06-16 MED ORDER — ENOXAPARIN SODIUM 40 MG/0.4ML IJ SOSY: 40.0000 mg | PREFILLED_SYRINGE | INTRAMUSCULAR | Status: DC

## 2024-06-16 MED ORDER — VANCOMYCIN HCL 1500 MG/300ML IV SOLN
1500.0000 mg | Freq: Once | INTRAVENOUS | Status: AC
  Administered 2024-06-16: 1500 mg via INTRAVENOUS
  Filled 2024-06-16: qty 300

## 2024-06-16 MED ORDER — METRONIDAZOLE 500 MG/100ML IV SOLN: 500.0000 mg | Freq: Two times a day (BID) | INTRAVENOUS | Status: DC

## 2024-06-16 MED ORDER — FENTANYL CITRATE (PF) 100 MCG/2ML IJ SOLN
INTRAMUSCULAR | Status: AC
  Filled 2024-06-16: qty 2

## 2024-06-16 MED ORDER — ACETAMINOPHEN 10 MG/ML IV SOLN
1000.0000 mg | Freq: Once | INTRAVENOUS | Status: AC
  Administered 2024-06-16: 1000 mg via INTRAVENOUS
  Filled 2024-06-16: qty 100

## 2024-06-16 MED ORDER — SODIUM CHLORIDE 0.9 % IV SOLN: 2.0000 g | Freq: Two times a day (BID) | INTRAVENOUS | Status: DC

## 2024-06-16 MED ORDER — SODIUM CHLORIDE 0.9% FLUSH
5.0000 mL | Freq: Three times a day (TID) | INTRAVENOUS | Status: DC
  Administered 2024-06-16 – 2024-06-26 (×28): 5 mL

## 2024-06-16 MED ORDER — ENOXAPARIN SODIUM 40 MG/0.4ML IJ SOSY
40.0000 mg | PREFILLED_SYRINGE | INTRAMUSCULAR | Status: DC
  Administered 2024-06-16 – 2024-07-07 (×22): 40 mg via SUBCUTANEOUS
  Filled 2024-06-16 (×22): qty 0.4

## 2024-06-16 MED ORDER — VANCOMYCIN HCL IN DEXTROSE 1-5 GM/200ML-% IV SOLN: 1000.0000 mg | INTRAVENOUS | Status: DC

## 2024-06-16 NOTE — Progress Notes (Signed)
 Pharmacy Antibiotic Note  Lindsay Mason is a 73 y.o. female admitted on 06/02/2024 with abdominal pain. S/p ex lap with small bowel resection. She was started on TPN for prolonged ileus. Diet advanced. Imaging on 9/2 concerning for new intra-abdominal abscesses as well as pneumonia. Pharmacy has been consulted for vancomycin  dosing.  Plan: -On day 14 of cefepime /flagyl  - extended given new findings on imaging -Vancomycin  1500 mg IV x 1 followed by 1000 mg IV q24h -Continue to follow renal function, cultures and clinical progress for dose adjustments and de-escalation as able  Height: 5' 2 (157.5 cm) Weight: 65.8 kg (145 lb 1 oz) IBW/kg (Calculated) : 50.1  Temp (24hrs), Avg:98.5 F (36.9 C), Min:98.1 F (36.7 C), Max:99 F (37.2 C)  Recent Labs  Lab 06/11/24 0311 06/13/24 0545 06/14/24 0600 06/15/24 1206 06/16/24 0500  WBC 15.2* 11.8* 11.4* 22.0* 20.2*  CREATININE 0.46 0.39* 0.38* 0.48 0.34*    Estimated Creatinine Clearance: 55.8 mL/min (A) (by C-G formula based on SCr of 0.34 mg/dL (L)).    Allergies  Allergen Reactions   Donepezil  Nausea And Vomiting   Penicillins Swelling    REACTION: swelling tongue - States has taken Cephalosporins without difficulty in past    Antimicrobials this admission: Vanc 8/20 >> 8/21, 9/3 >> Cefepime  8/20 >> Flagyl  8/20 >>  Dose adjustments this admission: NA  Microbiology results: 8/20 BCx: ngF 8/19 BCx: 1/4 staph hominis - likely contaminant 9/2 UCx: pending  9/3 MRSA PCR: pending  Thank you for allowing pharmacy to be a part of this patient's care.  Stefano MARLA Bologna, PharmD, BCPS Clinical Pharmacist 06/16/2024 8:49 AM

## 2024-06-16 NOTE — Progress Notes (Signed)
 Progress Note  7 Days Post-Op  Subjective: Son at bedside today.  Seems to have better pain control today.  He states her pain is difficult to differentiate as her pain is always a 10, pulling off a band-aid, HA, touching her feet, etc.  He did say her pain was clearly worse yesterday, but seems better today so far.    Objective: Vital signs in last 24 hours: Temp:  [98.1 F (36.7 C)-99 F (37.2 C)] 98.1 F (36.7 C) (09/03 0500) Pulse Rate:  [106-114] 106 (09/03 0500) Resp:  [18-20] 18 (09/03 0500) BP: (103-127)/(76-100) 115/76 (09/03 0500) SpO2:  [96 %-100 %] 100 % (09/03 0500) Weight:  [65.8 kg] 65.8 kg (09/03 0500) Last BM Date : 06/15/24  Intake/Output from previous day: 09/02 0701 - 09/03 0700 In: 30 [P.O.:30] Out: 750 [Urine:500; Blood:250] Intake/Output this shift: No intake/output data recorded.  PE: General: NAD Lungs: Respiratory effort nonlabored Abd: soft, diffusely tender, but very difficult to gauge to what extent, midline wound is clean and packed.  Seems a bit distended.   Lab Results:  Recent Labs    06/15/24 1206 06/16/24 0500  WBC 22.0* 20.2*  HGB 9.6* 8.3*  HCT 29.4* 25.9*  PLT 392 359   BMET Recent Labs    06/15/24 1206 06/16/24 0500  NA 139 139  K 4.4 3.8  CL 107 109  CO2 23 21*  GLUCOSE 130* 114*  BUN 36* 31*  CREATININE 0.48 0.34*  CALCIUM  8.5* 8.2*   PT/INR No results for input(s): LABPROT, INR in the last 72 hours. CMP     Component Value Date/Time   NA 139 06/16/2024 0500   NA 144 03/02/2024 1554   K 3.8 06/16/2024 0500   CL 109 06/16/2024 0500   CO2 21 (L) 06/16/2024 0500   GLUCOSE 114 (H) 06/16/2024 0500   BUN 31 (H) 06/16/2024 0500   BUN 20 03/02/2024 1554   CREATININE 0.34 (L) 06/16/2024 0500   CALCIUM  8.2 (L) 06/16/2024 0500   PROT 5.3 (L) 06/14/2024 0600   PROT 6.1 03/02/2024 1554   ALBUMIN  3.1 (L) 06/14/2024 0600   ALBUMIN  3.9 03/02/2024 1554   AST 33 06/14/2024 0600   ALT 20 06/14/2024 0600    ALKPHOS 73 06/14/2024 0600   BILITOT 0.3 06/14/2024 0600   BILITOT 0.2 03/02/2024 1554   GFRNONAA >60 06/16/2024 0500   GFRAA >60 01/22/2020 0226   Lipase     Component Value Date/Time   LIPASE 27 05/31/2024 2054       Studies/Results: CT ABDOMEN PELVIS W CONTRAST Result Date: 06/15/2024 CLINICAL DATA:  Complicated diverticulitis suspected. History of right lower lobe adenocarcinoma. EXAM: CT ABDOMEN AND PELVIS WITH CONTRAST TECHNIQUE: Multidetector CT imaging of the abdomen and pelvis was performed using the standard protocol following bolus administration of intravenous contrast. RADIATION DOSE REDUCTION: This exam was performed according to the departmental dose-optimization program which includes automated exposure control, adjustment of the mA and/or kV according to patient size and/or use of iterative reconstruction technique. CONTRAST:  OMNIPAQUE  IOHEXOL  300 MG/ML  SOLN COMPARISON:  CT abdomen and pelvis 06/08/2024. FINDINGS: Lower chest: Pleural effusions have resolved. There is minimal scarring or atelectasis in the left lung base. Hepatobiliary: There some fluid and stranding surrounding the gallbladder. There is no biliary ductal dilatation. The liver appear stable. There is a subcentimeter cyst or hemangioma in the peripheral right lobe. No new liver lesions are seen. Pancreas: Unremarkable. No pancreatic ductal dilatation or surrounding inflammatory changes. Spleen: Normal  in size without focal abnormality. Adrenals/Urinary Tract: Small left renal cysts appear unchanged. Otherwise, the kidneys and adrenal glands are within normal limits. There is diffuse bladder wall thickening. There is a small amount of air in the bladder. Stomach/Bowel: Patient is status post interval abdominal surgery. There is a small amount of free air in the upper abdomen likely related to recent surgery. Stomach is decompressed. There are dilated small bowel loops with air-fluid levels in the upper abdomen  measuring up to 3.6 cm. Again seen are clustered small bowel loops in the pelvis with wall thickening and mesenteric edema similar to prior study. There is a small amount of air within the mesentery at this level which is new from prior. The colon is nondilated. The appendix is not seen. Vascular/Lymphatic: Aortic atherosclerosis. No enlarged abdominal or pelvic lymph nodes. Reproductive: Uterus is unremarkable. Ovaries are not well delineated on this study. Other: New enhancing fluid collection seen in the right paracolic gutter measuring 5.8 x 2.4 by 6.0 cm. New multiloculated enhancing fluid collection seen in the central pelvis distal above the bladder measuring 3.5 x 4.8 x 3.0 cm. There is a small amount of simple free fluid in the right upper quadrant and left upper quadrant. Midline abdominal wall wound is present. There is diffuse body wall edema. There is no focal hernia. Musculoskeletal: The bones are osteopenic. There is dextroconvex curvature of the lumbar spine with multilevel degenerative change. IMPRESSION: 1. Interval abdominal surgery. There is a small amount of free air in the upper abdomen likely related to recent surgery. 2. New enhancing fluid collections in the right paracolic gutter and central pelvis worrisome for abscesses. 3. Persistent clustered small bowel loops in the pelvis with wall thickening and mesenteric edema. There is a small amount of air within the mesentery at this level which is new from prior. 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 4. Dilated small bowel loops in the upper abdomen worrisome for small bowel obstruction. 5. Diffuse bladder wall thickening with small amount of air in the bladder. Findings may be related to recent instrumentation or infection. 6. Small amount of simple free fluid in the right upper quadrant and left upper quadrant. 7. Body wall edema. 8. Aortic atherosclerosis. Aortic Atherosclerosis (ICD10-I70.0). Electronically Signed   By: Greig Pique  M.D.   On: 06/15/2024 23:44   DG CHEST PORT 1 VIEW Result Date: 06/15/2024 EXAM: 1 VIEW XRAY OF THE CHEST 06/15/2024 02:47:00 PM COMPARISON: 05/31/2024 CLINICAL HISTORY: Leukocytosis FINDINGS: LUNGS AND PLEURA: Low lung volumes. Left basilar patchy opacities. No pleural effusion. No pneumothorax. HEART AND MEDIASTINUM: No acute abnormality of the cardiac and mediastinal silhouettes. BONES AND SOFT TISSUES: No acute osseous abnormality. LINES AND TUBES: Right PICC with tip at superior cavoatrial junction. IMPRESSION: 1. Left basilar patchy opacities, possibly developing consolidation. 2. Low lung volumes. Electronically signed by: Franky Stanford MD 06/15/2024 06:31 PM EDT RP Workstation: [id number]    Anti-infectives: Anti-infectives (From admission, onward)    Start     Dose/Rate Route Frequency Ordered Stop   06/17/24 0900  vancomycin  (VANCOCIN ) IVPB 1000 mg/200 mL premix        1,000 mg 200 mL/hr over 60 Minutes Intravenous Every 24 hours 06/16/24 0839     06/16/24 1600  metroNIDAZOLE  (FLAGYL ) IVPB 500 mg        500 mg 100 mL/hr over 60 Minutes Intravenous Every 12 hours 06/16/24 0840     06/16/24 1500  ceFEPIme  (MAXIPIME ) 2 g in sodium chloride  0.9 % 100  mL IVPB        2 g 200 mL/hr over 30 Minutes Intravenous Every 12 hours 06/16/24 0840     06/16/24 0815  vancomycin  (VANCOREADY) IVPB 1500 mg/300 mL        1,500 mg 150 mL/hr over 120 Minutes Intravenous  Once 06/16/24 0723     06/10/24 0300  ceFEPIme  (MAXIPIME ) 2 g in sodium chloride  0.9 % 100 mL IVPB  Status:  Discontinued        2 g 200 mL/hr over 30 Minutes Intravenous Every 12 hours 06/09/24 1732 06/16/24 0840   06/09/24 2100  metroNIDAZOLE  (FLAGYL ) IVPB 500 mg  Status:  Discontinued        500 mg 100 mL/hr over 60 Minutes Intravenous Every 12 hours 06/09/24 1725 06/16/24 0840   06/09/24 1730  ceFEPIme  (MAXIPIME ) 1 g in sodium chloride  0.9 % 100 mL IVPB  Status:  Discontinued        1 g 200 mL/hr over 30 Minutes Intravenous Every  12 hours 06/09/24 1725 06/09/24 1731   06/09/24 1500  ceFEPIme  (MAXIPIME ) 2 g in sodium chloride  0.9 % 100 mL IVPB        2 g 200 mL/hr over 30 Minutes Intravenous  Once 06/09/24 1454 06/09/24 1743   06/03/24 1130  vancomycin  (VANCOCIN ) IVPB 1000 mg/200 mL premix  Status:  Discontinued        1,000 mg 200 mL/hr over 60 Minutes Intravenous Every 24 hours 06/02/24 1149 06/03/24 0949   06/03/24 0000  ceFEPIme  (MAXIPIME ) 2 g in sodium chloride  0.9 % 100 mL IVPB        2 g 200 mL/hr over 30 Minutes Intravenous Every 12 hours 06/02/24 1149 06/08/24 1553   06/02/24 2200  metroNIDAZOLE  (FLAGYL ) IVPB 500 mg  Status:  Discontinued        500 mg 100 mL/hr over 60 Minutes Intravenous Every 12 hours 06/02/24 1113 06/09/24 1730   06/02/24 1100  ceFEPIme  (MAXIPIME ) 2 g in sodium chloride  0.9 % 100 mL IVPB        2 g 200 mL/hr over 30 Minutes Intravenous  Once 06/02/24 1045 06/02/24 1449   06/02/24 1100  vancomycin  (VANCOCIN ) IVPB 1000 mg/200 mL premix        1,000 mg 200 mL/hr over 60 Minutes Intravenous  Once 06/02/24 1045 06/02/24 1233   06/02/24 1100  metroNIDAZOLE  (FLAGYL ) IVPB 500 mg        500 mg 100 mL/hr over 60 Minutes Intravenous  Once 06/02/24 1045 06/02/24 1818        Assessment/Plan POD 7, s/p 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 Curvin 8/27 - WBC down from 22K to 20K today.  CBC in am -CT A/P reviewed and discussed with Dr. Teresa.  Not convinced right now that she has recurrent bowel ischemia.  She does have some fluid collections noted on her imaging.  Will d/w IR regarding possible drainage of these collections. -cont to closely monitor her.  Her Alzheimer's makes gauging her pain (better/worse) etc very difficult.  She is currently AF, HR in low 100s which has been her baseline for the last couple of days.  Prior to this she was in the 90s. 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   LOS: 14 days    Burnard FORBES Banter, [facility] Surgery 06/16/2024, 9:21 AM Please see Amion for pager number during day hours 7:00am-4:30pm

## 2024-06-16 NOTE — Plan of Care (Signed)
  Problem: Respiratory: Goal: Ability to maintain adequate ventilation will improve Outcome: Progressing   Problem: Clinical Measurements: Goal: Ability to maintain clinical measurements within normal limits will improve Outcome: Progressing Goal: Will remain free from infection Outcome: Progressing

## 2024-06-16 NOTE — Consult Note (Signed)
 Chief Complaint: Abdominal pain, postop abdominal/pelvic fluid collections; referred for image guided drainage of abdominal/pelvic fluid collection  Referring Provider(s): White,C  Supervising Physician: Jenna Hacker  Patient Status: [facility] - In-pt  History of Present Illness: Lindsay Mason is a 73 y.o. 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  CT on admission revealed scattered colonic diverticulosis with a small diverticulitis.  During her admission she also continued to have abdominal distention and pain, concern for SBO ,general surgery was consulted and patient  subsequently underwent exploratory laparotomy with small bowel resection on 06/09/2024 for ischemic bowel.  Recent CT of the abdomen and pelvis reveals:  1. Interval abdominal surgery. There is a small amount of free air in the upper abdomen likely related to recent surgery. 2. New enhancing fluid collections in the right paracolic gutter and central pelvis worrisome for abscesses. 3. Persistent clustered small bowel loops in the pelvis with wall thickening and mesenteric edema. There is a small amount of air within the mesentery at this level which is new from prior. 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 4. Dilated small bowel loops in the upper abdomen worrisome for small bowel obstruction. 5. Diffuse bladder wall thickening with small amount of air in the bladder. Findings may be related to recent instrumentation or infection. 6. Small amount of simple free fluid in the right upper quadrant and left upper quadrant. 7. Body wall edema. 8. Aortic atherosclerosis  Patient is afebrile, WBC  20.2, hemoglobin 8.3, platelets normal, creatinine 0.34,PT/INR pend.  Request now received for image guided drainage of abdominal/pelvic fluid collection.    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   by CT - moderate centrilobular and paraseptal   Ex-smoker 08/2014   currently using e cig   Microscopic colitis 06/2015   lymphocytic by colonoscopy - started entocort [name])   Osteoporosis, unspecified    Scoliosis of lumbar spine    Thoracic aortic atherosclerosis (HCC) 02/2016   by CT    Past Surgical History:  Procedure Laterality Date   CATARACT EXTRACTION W/PHACO Right 02/21/2021   Procedure: CATARACT EXTRACTION PHACO AND INTRAOCULAR LENS PLACEMENT (IOC) RIGHT;  Surgeon: Mittie Gaskin, MD;  Location: [facility];  Service: Ophthalmology;  Laterality: Right;  cde 4.75 00:41.1 minutes 11.5%   CATARACT EXTRACTION W/PHACO Left 03/14/2021   Procedure: CATARACT EXTRACTION PHACO AND INTRAOCULAR LENS PLACEMENT (IOC) LEFT 4.07 00:44.6;  Surgeon: Mittie Gaskin, MD;  Location: [facility];  Service: Ophthalmology;  Laterality: Left;   COLONOSCOPY  06/2015   microscopic colitis [name])   DEXA  10/2008   Osteoporosis   DEXA  02/2014   T score osteopenia hip, osteoporosis spine, scoliosis   ELECTROMAGNETIC NAVIGATION BROCHOSCOPY  01/20/2020   Procedure: ELECTROMAGNETIC NAVIGATION BRONCHOSCOPY;  Surgeon: Brenna Adine CROME, DO;  Location: [facility];  Service: Pulmonary;;   ETT  10/2011   WNL, no evidence of ischemia, excellent exercise tolerance   EXPLORATORY LAPAROTOMY     INTERCOSTAL NERVE BLOCK Right 01/20/2020   Procedure: Intercostal Nerve Block;  Surgeon: Shyrl Linnie KIDD,  MD;  Location: [facility] OR;  Service: Thoracic;  Laterality: Right;   LAPAROSCOPY N/A 06/09/2024   Procedure: LAPAROSCOPY,  DIAGNOSTIC, EXPLORATORY LAPAROTOMY WITH SMALL BOWEL RESECTION;  Surgeon: Curvin Deward MOULD, MD;  Location: [facility];  Service: General;  Laterality: N/A;  Possible laparotomy   MRI head  07/2011   multiple foci deep and subcortical white matter, chronic ischemic vs demyelinating, no active disease   NODE DISSECTION  01/20/2020   Procedure: Node Dissection;  Surgeon: Shyrl Linnie KIDD, MD;  Location: [facility] OR;  Service: Thoracic;;   SUBMUCOSAL TATTOO INJECTION  01/20/2020   Procedure: SUBSTITIAL TATTOO INJECTION;  Surgeon: Brenna Adine CROME, DO;  Location: [facility];  Service: Pulmonary;;   TONSILLECTOMY AND ADENOIDECTOMY     Vein removal     removed from legs   VIDEO ASSISTED THORACOSCOPY (VATS)/WEDGE RESECTION Right 01/20/2020   XI ROBOTIC ASSISTED THORASCOPY-RIGHT LOWER LOBE WEDGE RESECTION, RIGHT LOWER LOBECTOMYRightGeneral   VIDEO BRONCHOSCOPY  01/20/2020   Procedure: VIDEO BRONCHOSCOPY WITH FLUORO;  Surgeon: Brenna Adine CROME, DO;  Location: [facility];  Service: Pulmonary;;    Allergies: Donepezil  and Penicillins  Medications: Prior to Admission medications   Medication Sig Start Date End Date Taking? Authorizing Provider  albuterol  (VENTOLIN  HFA) 108 (90 Base) MCG/ACT inhaler Inhale 2 puffs into the lungs every 6 (six) hours as needed for wheezing or shortness of breath. 08/15/22  Yes Icard, Adine CROME, DO  atorvastatin  (LIPITOR) 20 MG tablet Take 20 mg by mouth daily.   Yes [provider]  b complex vitamins capsule Take 1 capsule by mouth daily.   Yes [provider]  famotidine  (PEPCID ) 20 MG tablet Take 1 tablet (20 mg total) by mouth daily as needed for heartburn or indigestion. 12/05/23  Yes [name], MD  Multiple Vitamin (MULTIVITAMIN) tablet Take 1 tablet by mouth daily.   Yes [provider]  nortriptyline  (PAMELOR ) 25 MG capsule Take 1 capsule (25 mg total) by mouth at bedtime. 05/07/24  Yes [name], MD  traZODone  (DESYREL ) 50 MG tablet  Take 50 mg by mouth at bedtime as needed for sleep. 05/20/24  Yes [provider]  rivastigmine  (EXELON ) 9.5 mg/24hr PLACE 1 PATCH (9.5 MG TOTAL) ONTO THE SKIN DAILY. 06/08/24   [name], MD     Family History  Problem Relation Age of Onset   Osteoporosis Other        great grandmother   Stroke Maternal Grandmother    Coronary artery disease Maternal Grandfather 60   Diabetes Maternal Grandfather    Coronary artery disease Paternal Grandmother 53   Coronary artery disease Paternal Grandfather 87   Lung cancer Maternal Aunt     Social History   Socioeconomic History   Marital status: Married    Spouse name: Not on file   Number of children: 3   Years of education: Not on file   Highest education level: Not on file  Occupational History   Occupation: retired  Tobacco Use   Smoking status: Former    Current packs/day: 0.00    Average packs/day: 1 pack/day for 36.7 years (36.7 ttl pk-yrs)    Types: Cigarettes    Start date: 10/14/1978    Quit date: 07/15/2015    Years since quitting: 8.9   Smokeless tobacco: Never   Tobacco comments:    off and on smoker, now e-cigarettes  Vaping Use   Vaping status: Some Days   Devices: uses occasionally - no nicotine  Substance and Sexual Activity  Alcohol use: Not Currently   Drug use: No   Sexual activity: Not on file  Other Topics Concern   Not on file  Social History Narrative   Caffeine: 1 cup coffee/day   Lives with husband, outside dog   Smoking vapes.   Pt tans regularly - at her pool and in her garden. Wears sunscreen   Social Drivers of Corporate investment banker Strain: Low Risk  (05/05/2024)   Overall Financial Resource Strain (CARDIA)    Difficulty of Paying Living Expenses: Not hard at all  Food Insecurity: No Food Insecurity (06/02/2024)   Hunger Vital Sign    Worried About Running Out of Food in the Last Year: Never true    Ran Out of Food in the Last Year: Never true  Transportation Needs: No  Transportation Needs (06/02/2024)   PRAPARE - Administrator, Civil Service (Medical): No    Lack of Transportation (Non-Medical): No  Physical Activity: Sufficiently Active (05/05/2024)   Exercise Vital Sign    Days of Exercise per Week: 7 days    Minutes of Exercise per Session: 40 min  Stress: No Stress Concern Present (05/05/2024)   [employer] of Occupational Health - Occupational Stress Questionnaire    Feeling of Stress: Only a little  Social Connections: Moderately Isolated (06/02/2024)   Social Connection and Isolation Panel    Frequency of Communication with Friends and Family: Three times a week    Frequency of Social Gatherings with Friends and Family: Three times a week    Attends Religious Services: Never    Active Member of Clubs or Organizations: No    Attends Banker Meetings: Never    Marital Status: Married       Review of Systems currently without fever, headache, respiratory issues, nausea, vomiting or bleeding.  She continues to have abdominal pain.  Vital Signs: BP 124/82 (BP Location: Left Arm)   Pulse (!) 103   Temp 98.6 F (37 C)   Resp 18   Ht 5' 2 (1.575 m)   Wt 145 lb 1 oz (65.8 kg)   SpO2 100%   BMI 26.53 kg/m   Advance Care Plan: No documents on file.   Physical Exam: Patient awake, noted history of dementia.  She was agitated during bathing session this morning.  Chest clear to auscultation bilaterally.  Heart with slightly tachycardic but regular rhythm.  Abdomen slightly distended, midline wound with intact bandage, some diffuse tenderness to palpation ;no lower extremity edema.  Imaging: CT ABDOMEN PELVIS W CONTRAST Result Date: 06/15/2024 CLINICAL DATA:  Complicated diverticulitis suspected. History of right lower lobe adenocarcinoma. EXAM: CT ABDOMEN AND PELVIS WITH CONTRAST TECHNIQUE: Multidetector CT imaging of the abdomen and pelvis was performed using the standard protocol following bolus administration of  intravenous contrast. RADIATION DOSE REDUCTION: This exam was performed according to the departmental dose-optimization program which includes automated exposure control, adjustment of the mA and/or kV according to patient size and/or use of iterative reconstruction technique. CONTRAST:  OMNIPAQUE  IOHEXOL  300 MG/ML  SOLN COMPARISON:  CT abdomen and pelvis 06/08/2024. FINDINGS: Lower chest: Pleural effusions have resolved. There is minimal scarring or atelectasis in the left lung base. Hepatobiliary: There some fluid and stranding surrounding the gallbladder. There is no biliary ductal dilatation. The liver appear stable. There is a subcentimeter cyst or hemangioma in the peripheral right lobe. No new liver lesions are seen. Pancreas: Unremarkable. No pancreatic ductal dilatation or surrounding inflammatory changes. Spleen: Normal  in size without focal abnormality. Adrenals/Urinary Tract: Small left renal cysts appear unchanged. Otherwise, the kidneys and adrenal glands are within normal limits. There is diffuse bladder wall thickening. There is a small amount of air in the bladder. Stomach/Bowel: Patient is status post interval abdominal surgery. There is a small amount of free air in the upper abdomen likely related to recent surgery. Stomach is decompressed. There are dilated small bowel loops with air-fluid levels in the upper abdomen measuring up to 3.6 cm. Again seen are clustered small bowel loops in the pelvis with wall thickening and mesenteric edema similar to prior study. There is a small amount of air within the mesentery at this level which is new from prior. The colon is nondilated. The appendix is not seen. Vascular/Lymphatic: Aortic atherosclerosis. No enlarged abdominal or pelvic lymph nodes. Reproductive: Uterus is unremarkable. Ovaries are not well delineated on this study. Other: New enhancing fluid collection seen in the right paracolic gutter measuring 5.8 x 2.4 by 6.0 cm. New  multiloculated enhancing fluid collection seen in the central pelvis distal above the bladder measuring 3.5 x 4.8 x 3.0 cm. There is a small amount of simple free fluid in the right upper quadrant and left upper quadrant. Midline abdominal wall wound is present. There is diffuse body wall edema. There is no focal hernia. Musculoskeletal: The bones are osteopenic. There is dextroconvex curvature of the lumbar spine with multilevel degenerative change. IMPRESSION: 1. Interval abdominal surgery. There is a small amount of free air in the upper abdomen likely related to recent surgery. 2. New enhancing fluid collections in the right paracolic gutter and central pelvis worrisome for abscesses. 3. Persistent clustered small bowel loops in the pelvis with wall thickening and mesenteric edema. There is a small amount of air within the mesentery at this level which is new from prior. 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 4. Dilated small bowel loops in the upper abdomen worrisome for small bowel obstruction. 5. Diffuse bladder wall thickening with small amount of air in the bladder. Findings may be related to recent instrumentation or infection. 6. Small amount of simple free fluid in the right upper quadrant and left upper quadrant. 7. Body wall edema. 8. Aortic atherosclerosis. Aortic Atherosclerosis (ICD10-I70.0). Electronically Signed   By: Greig Pique M.D.   On: 06/15/2024 23:44   DG CHEST PORT 1 VIEW Result Date: 06/15/2024 EXAM: 1 VIEW XRAY OF THE CHEST 06/15/2024 02:47:00 PM COMPARISON: 05/31/2024 CLINICAL HISTORY: Leukocytosis FINDINGS: LUNGS AND PLEURA: Low lung volumes. Left basilar patchy opacities. No pleural effusion. No pneumothorax. HEART AND MEDIASTINUM: No acute abnormality of the cardiac and mediastinal silhouettes. BONES AND SOFT TISSUES: No acute osseous abnormality. LINES AND TUBES: Right PICC with tip at superior cavoatrial junction. IMPRESSION: 1. Left basilar patchy opacities, possibly  developing consolidation. 2. Low lung volumes. Electronically signed by: Franky Stanford MD 06/15/2024 06:31 PM EDT RP Workstation: [id number]   CT ABDOMEN PELVIS W CONTRAST Result Date: 06/08/2024 CLINICAL DATA:  Abdominal pain and distension. Dilated small bowel on KUB 06/08/2024 raising concern for small bowel obstruction. EXAM: CT ABDOMEN AND PELVIS WITH CONTRAST TECHNIQUE: Multidetector CT imaging of the abdomen and pelvis was performed using the standard protocol following bolus administration of intravenous contrast. RADIATION DOSE REDUCTION: This exam was performed according to the departmental dose-optimization program which includes automated exposure control, adjustment of the mA and/or kV according to patient size and/or use of iterative reconstruction technique. CONTRAST:  OMNIPAQUE  IOHEXOL  300 MG/ML  SOLN COMPARISON:  06/01/2024 abdominal radiograph 06/08/2024 FINDINGS: Lower chest: New small bilateral pleural effusions and associated passive atelectasis. New mild cardiomegaly. A nasogastric tube extends in the distal esophagus and into the stomach body. Hepatobiliary: Small subcapsular cyst in segment 8 on image 12 series 12, similar to previous. Contracted gallbladder. Pancreas: Unremarkable Spleen: Unremarkable Adrenals/Urinary Tract: Stable fullness of the adrenal glands, especially along the left side, without a discrete mass. Bosniak category 1 cyst of the left kidney lower pole. Urinary bladder unremarkable. Stomach/Bowel: Nasogastric tube terminates in the stomach body. Sigmoid colon diverticulosis. Contrast medium in normal appendix. Complex appearance of severely thickened loops of central pelvic small bowel with indistinct bowel wall margins, surrounding edema, dilated small bowel extending towards these loops, and at least some sense of twisting of bowel for example in the central abdomen on images 52-56 of series 2 along adjacent branches of the superior mesenteric vein concerning for  mesenteric volvulus or closed loop obstruction. No definite pneumatosis or extraluminal gas. The indistinctness of bowel walls makes this region difficult to assess, and the possibility of intussusception is also not completely excluded. There is some small loculations of gas for example near the left inguinal ring potentially along the thickened bowel which are presumably intraluminal but could be contained tiny amounts of extraluminal gas. Vascular/Lymphatic: Patent celiac trunk and SMA. Aortoiliac atherosclerosis. Reproductive: Unremarkable fluid density lesion along the right adnexa 4.5 by 3.1 cm with single internal septation, this could be due to hydrosalpinx or ovarian cysts. This has been present and not hypermetabolic on prior PET-CT. Other: Mild ascites.  Presacral edema.  Mild mesenteric edema. Musculoskeletal: Dextroconvex thoracolumbar scoliosis with substantial rotary component. Healed left posterior rib fractures. Mild deformity of the left pubic bone potentially from old fracture. Bone island in the left sacrum. IMPRESSION: 1. Severely thickened and inflamed central pelvic loops of small bowel concerning for ischemia. Complex appearance due to indistinct bowel wall margins, surrounding edema, dilated small bowel extending towards these loops, and at least some sense of twisting of bowel along adjacent branches of the superior mesenteric vein concerning for mesenteric volvulus or closed loop obstruction. No definite pneumatosis. Small locules of gas along the left inguinal ring and along the inflamed loops of bowel are presumably intraluminal but could conceivably represent contained extraluminal gas from local microperforation. 2. New small bilateral pleural effusions and associated passive atelectasis. 3. New mild cardiomegaly. 4. Mild ascites. 5. Sigmoid colon diverticulosis. 6. Stable fluid density lesion along the right adnexa with single internal septation, this could be due to hydrosalpinx or  ovarian cysts. This has been present and not hypermetabolic on prior PET-CT. 7. 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 8.  Aortic Atherosclerosis (ICD10-I70.0). Electronically Signed   By: Ryan Salvage M.D.   On: 06/08/2024 17:45   US  EKG SITE RITE Result Date: 06/08/2024 If [facility] image not attached, placement could not be confirmed due to current cardiac rhythm.  DG Abd Portable 1V Result Date: 06/08/2024 EXAM: 1 VIEW XRAY OF THE ABDOMEN 06/08/2024 05:23:00 AM COMPARISON: 06/07/2024 CLINICAL HISTORY: [id number] SBO (small bowel obstruction) (HCC) [id number]. Small bowel obstruction FINDINGS: BOWEL: Gas distended mid abdominal small bowel loops slightly increased in degree of distention since prior study. There is mild gaseous distention of the proximal colon. SOFT TISSUES: No opaque urinary calculi. BONES: Lumbar Dextroscoliosis with multilevel spondylitic change. No acute osseous abnormality. IMPRESSION: 1. Small bowel obstruction with gas distended mid abdominal small bowel loops, slightly increased in degree of distention since prior study. 2.  mild gaseous distention of the proximal colon. Electronically signed by: Katheleen Faes MD 06/08/2024 09:26 AM EDT RP Workstation: [id number]   DG Abd Portable 1V Result Date: 06/07/2024 CLINICAL DATA:  Follow-up ileus 24 hour film EXAM: PORTABLE ABDOMEN - 1 VIEW COMPARISON:  Film from earlier in the same day. FINDINGS: Administered contrast lies within multiple dilated loops of small bowel. Some contrast has reached the colon although the majority lies within the small bowel. No free air is noted. Gastric catheter is noted in place. IMPRESSION: Persistent small bowel dilatation with contrast within. Some colonic contrast is noted. Electronically Signed   By: Oneil Devonshire M.D.   On: 06/07/2024 20:02   DG Abd Portable 1V-Small Bowel Obstruction Protocol-initial, 8 hr delay Result Date: 06/07/2024 CLINICAL DATA:  8 hour post  enteric contrast small-bowel obstruction follow-up exam. EXAM: PORTABLE ABDOMEN - 1 VIEW COMPARISON:  Portable abdomen yesterday 10:32 a.m., portable abdomen 06/05/2024 FINDINGS: 12:21 a.m. NGT curves to the left and up within the stomach with the tip in the proximal fundus. Small volume of enteric contrast remains in the gastric fundus and neck trauma. Faint dilute contrast partially opacifies dilated small bowel. There are some decompressed segments in true pelvis but elsewhere the small bowel is diffusely dilated, with little change over the previous 3 days films up to 4.8 cm. There is scattered barium precipitate in left hemiabdomen. Faint contrast seen in the cecum, proximal ascending colon and is present as far distally as distal descending colon. There is no supine evidence of free air. Small pleural effusions appear similar. Dextroscoliosis and degenerative change lumbar spine osteopenia. IMPRESSION: 1. Persistent diffuse small bowel dilatation with little change over the previous 3 days. 2. Faint contrast seen in the cecum, proximal ascending colon and distal descending colon. 3. Small pleural effusions appear similar. 4. NGT curves to the left and up within the stomach with the tip in the proximal fundus. Electronically Signed   By: Francis Quam M.D.   On: 06/07/2024 00:42   DG Abd 1 View Result Date: 06/06/2024 CLINICAL DATA:  NG placement. EXAM: ABDOMEN - 1 VIEW COMPARISON:  Radiograph dated 06/05/2024. FINDINGS: Enteric tube with tip in the left upper abdomen in similar position in the region of the gastric fundus. Dilated small bowel loops again noted. IMPRESSION: Enteric tube with tip in the region of the gastric fundus. Electronically Signed   By: Vanetta Chou M.D.   On: 06/06/2024 14:34   DG Abd Portable 1V Result Date: 06/05/2024 CLINICAL DATA:  NG tube placement. EXAM: PORTABLE ABDOMEN - 1 VIEW COMPARISON:  06/04/2024. FINDINGS: Enteric tube tip overlies the expected region of the  gastric fundus with side port in the gastric body. Similar gaseous distension of upper abdominal small bowel, suggestive of small bowel obstruction. IMPRESSION: 1. Enteric tube tip overlies the expected region of the gastric fundus with side port in the gastric body. 2. Similar gaseous dilated loops of upper abdominal small bowel, suggestive of small bowel obstruction. Electronically Signed   By: Harrietta Sherry M.D.   On: 06/05/2024 14:01   DG Abd 1 View Result Date: 06/04/2024 CLINICAL DATA:  Abdominal pain.  Nausea and vomiting. EXAM: ABDOMEN - 1 VIEW COMPARISON:  CT abdomen and pelvis 06/01/2024 FINDINGS: New since prior study, there is gaseous distention of upper abdominal small bowel with decreased gas in the colon. This likely indicates small bowel obstruction. No radiopaque stones. Degenerative changes in the spine and hips. Lumbar scoliosis convex towards the right. Lung bases are  clear. IMPRESSION: Gaseous distention of upper abdominal small bowel likely representing small bowel obstruction. Electronically Signed   By: Elsie Gravely M.D.   On: 06/04/2024 19:12   ECHOCARDIOGRAM COMPLETE Result Date: 06/02/2024    ECHOCARDIOGRAM REPORT   Patient Name:   Lindsay Mason Date of Exam: 06/02/2024 Medical Rec #:  [id number]        Height:       62.0 in Accession #:    [id number]       Weight:       135.1 lb Date of Birth:  1951-01-24         BSA:          1.618 m Patient Age:    73 years         BP:           91/79 mmHg Patient Gender: F                HR:           85 bpm. Exam Location:  Inpatient Procedure: 2D Echo, Cardiac Doppler and Color Doppler (Both Spectral and Color            Flow Doppler were utilized during procedure). Indications:    Abnormal EKG                 Bacteremia  History:        Patient has no prior history of Echocardiogram examinations.                 Signs/Symptoms:Bacteremia.  Sonographer:    Vella Key Referring Phys: [id number] DAVID MANUEL ORTIZ IMPRESSIONS  1. Left  ventricular ejection fraction, by estimation, is 65 to 70%. The left ventricle has normal function. The left ventricle has no regional wall motion abnormalities. There is mild concentric left ventricular hypertrophy. Left ventricular diastolic parameters are consistent with Grade I diastolic dysfunction (impaired relaxation).  2. Right ventricular systolic function is normal. The right ventricular size is normal.  3. The mitral valve is normal in structure. No evidence of mitral valve regurgitation. No evidence of mitral stenosis.  4. The aortic valve is tricuspid. There is mild calcification of the aortic valve. Aortic valve regurgitation is not visualized. Aortic valve sclerosis/calcification is present, without any evidence of aortic stenosis.  5. The inferior vena cava is normal in size with greater than 50% respiratory variability, suggesting right atrial pressure of 3 mmHg. Conclusion(s)/Recommendation(s): No evidence of valvular vegetations on this transthoracic echocardiogram. Consider a transesophageal echocardiogram to exclude infective endocarditis if clinically indicated. FINDINGS  Left Ventricle: Left ventricular ejection fraction, by estimation, is 65 to 70%. The left ventricle has normal function. The left ventricle has no regional wall motion abnormalities. The left ventricular internal cavity size was normal in size. There is  mild concentric left ventricular hypertrophy. Left ventricular diastolic parameters are consistent with Grade I diastolic dysfunction (impaired relaxation). Right Ventricle: The right ventricular size is normal. No increase in right ventricular wall thickness. Right ventricular systolic function is normal. Left Atrium: Left atrial size was normal in size. Right Atrium: Right atrial size was normal in size. Pericardium: There is no evidence of pericardial effusion. Mitral Valve: The mitral valve is normal in structure. No evidence of mitral valve regurgitation. No evidence of  mitral valve stenosis. Tricuspid Valve: The tricuspid valve is normal in structure. Tricuspid valve regurgitation is not demonstrated. No evidence of tricuspid stenosis. Aortic Valve: The aortic valve is tricuspid. There is mild  calcification of the aortic valve. Aortic valve regurgitation is not visualized. Aortic valve sclerosis/calcification is present, without any evidence of aortic stenosis. Pulmonic Valve: The pulmonic valve was normal in structure. Pulmonic valve regurgitation is not visualized. No evidence of pulmonic stenosis. Aorta: The aortic root is normal in size and structure. Venous: The inferior vena cava is normal in size with greater than 50% respiratory variability, suggesting right atrial pressure of 3 mmHg. IAS/Shunts: No atrial level shunt detected by color flow Doppler.  LEFT VENTRICLE PLAX 2D LVIDd:         3.45 cm     Diastology LVIDs:         2.90 cm     LV e' medial:    5.44 cm/s LV PW:         1.25 cm     LV E/e' medial:  13.1 LV IVS:        1.35 cm     LV e' lateral:   7.83 cm/s                            LV E/e' lateral: 9.1  LV Volumes (MOD) LV vol d, MOD A2C: 96.6 ml LV vol d, MOD A4C: 85.8 ml LV vol s, MOD A2C: 49.6 ml LV vol s, MOD A4C: 38.6 ml LV SV MOD A2C:     47.0 ml LV SV MOD A4C:     85.8 ml LV SV MOD BP:      46.6 ml RIGHT VENTRICLE RV Basal diam:  2.80 cm RV S prime:     14.10 cm/s TAPSE (M-mode): 2.3 cm LEFT ATRIUM             Index        RIGHT ATRIUM           Index LA diam:        3.20 cm 1.98 cm/m   RA Area:     12.30 cm LA Vol (A2C):   44.2 ml 27.31 ml/m  RA Volume:   30.90 ml  19.09 ml/m LA Vol (A4C):   36.1 ml 22.31 ml/m LA Biplane Vol: 44.3 ml 27.37 ml/m  AORTIC VALVE LVOT Vmax:   95.30 cm/s LVOT Vmean:  66.300 cm/s LVOT VTI:    0.163 m  AORTA Ao Root diam: 3.30 cm MITRAL VALVE MV Area (PHT): 6.90 cm     SHUNTS MV Decel Time: 110 msec     Systemic VTI: 0.16 m MV E velocity: 71.10 cm/s MV A velocity: 109.00 cm/s MV E/A ratio:  0.65 Toribio Fuel MD  Electronically signed by Toribio Fuel MD Signature Date/Time: 06/02/2024/1:47:03 PM    Final    CT ABDOMEN PELVIS W CONTRAST Result Date: 06/01/2024 CLINICAL DATA:  Abdominal pain EXAM: CT ABDOMEN AND PELVIS WITH CONTRAST TECHNIQUE: Multidetector CT imaging of the abdomen and pelvis was performed using the standard protocol following bolus administration of intravenous contrast. RADIATION DOSE REDUCTION: This exam was performed according to the departmental dose-optimization program which includes automated exposure control, adjustment of the mA and/or kV according to patient size and/or use of iterative reconstruction technique. CONTRAST:  OMNIPAQUE  IOHEXOL  300 MG/ML  SOLN COMPARISON:  01/09/2024 FINDINGS: Lower chest: No acute findings Hepatobiliary: Stable 9 mm low-density lesion in the right hepatic lobe peripherally, likely cyst. No suspicious focal hepatic abnormality. Gallbladder unremarkable. Pancreas: No focal abnormality or ductal dilatation. Spleen: No focal abnormality.  Normal size. Adrenals/Urinary Tract: No adrenal  nodules. Diffuse enlargement of the left adrenal gland, likely hyperplasia, stable. 1.6 cm low-density lesion in the lower pole of the left kidney is stable and most compatible with cysts. No follow-up imaging recommended. No hydronephrosis. Urinary bladder unremarkable. Stomach/Bowel: Colonic diverticulosis. No active diverticulitis. Stomach and small bowel decompressed. No bowel obstruction or inflammatory process. Vascular/Lymphatic: Aortic atherosclerosis. No evidence of aneurysm or adenopathy. Reproductive: Again noted are 2 adjacent right adnexal cystic structures, the largest measuring 3.2 cm, not significantly changed since prior study or since PET CT 01/03/2020. No left adnexal mass. Other: No free fluid or free air. Musculoskeletal: Scoliosis and advanced degenerative changes. No acute bony abnormality. IMPRESSION: No acute findings in the abdomen or pelvis. Scattered  colonic diverticulosis.  No active diverticulitis. Aortic atherosclerosis. Stable cystic areas within the right adnexa. This is stable dating back to 2021. No specific follow-up recommended. Electronically Signed   By: Franky Crease M.D.   On: 06/01/2024 01:40   DG Chest 2 View Result Date: 05/31/2024 CLINICAL DATA:  Altered mental status leukocytosis EXAM: CHEST - 2 VIEW COMPARISON:  01/15/2024 FINDINGS: No acute airspace disease or effusion. Stable cardiomediastinal silhouette with aortic atherosclerosis. No pneumothorax IMPRESSION: No active cardiopulmonary disease. Electronically Signed   By: Luke Bun M.D.   On: 05/31/2024 21:50    Labs:  CBC: Recent Labs    06/13/24 0545 06/14/24 0600 06/15/24 1206 06/16/24 0500  WBC 11.8* 11.4* 22.0* 20.2*  HGB 10.0* 11.0* 9.6* 8.3*  HCT 30.9* 35.0* 29.4* 25.9*  PLT 297 416* 392 359    COAGS: No results for input(s): INR, APTT in the last 8760 hours.  BMP: Recent Labs    06/13/24 0545 06/14/24 0600 06/15/24 1206 06/16/24 0500  NA 141 140 139 139  K 4.1 4.4 4.4 3.8  CL 104 105 107 109  CO2 28 25 23  21*  GLUCOSE 110* 132* 130* 114*  BUN 26* 24* 36* 31*  CALCIUM  8.4* 8.7* 8.5* 8.2*  CREATININE 0.39* 0.38* 0.48 0.34*  GFRNONAA >60 >60 >60 >60    LIVER FUNCTION TESTS: Recent Labs    06/08/24 0531 06/09/24 0208 06/10/24 0332 06/14/24 0600  BILITOT 0.3 0.3 0.3 0.3  AST 30 24 22  33  ALT 21 17 15 20   ALKPHOS 64 74 59 73  PROT 5.0* 5.0* 5.0* 5.3*  ALBUMIN  2.5* 2.9* 3.3* 3.1*    TUMOR MARKERS: No results for input(s): AFPTM, CEA, CA199, CHROMGRNA in the last 8760 hours.  Assessment and Plan: 73 y.o. 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  CT on admission revealed scattered colonic diverticulosis with a small diverticulitis.  During her admission she also continued to have abdominal distention and pain, concern for SBO ,general surgery was consulted and patient  subsequently underwent exploratory laparotomy with small bowel resection on 06/09/2024 for ischemic bowel.  Recent CT of the abdomen and pelvis reveals:  1. Interval abdominal surgery. There is a small amount of free air in the upper abdomen likely related to recent surgery. 2. New enhancing fluid collections in the right paracolic gutter and central pelvis worrisome for abscesses. 3. Persistent clustered small bowel loops in the pelvis with wall thickening and mesenteric edema. There is a small amount of air within the mesentery at this level which  is new from prior. 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 4. Dilated small bowel loops in the upper abdomen worrisome for small bowel obstruction. 5. Diffuse bladder wall thickening with small amount of air in the bladder. Findings may be related to recent instrumentation or infection. 6. Small amount of simple free fluid in the right upper quadrant and left upper quadrant. 7. Body wall edema. 8. Aortic atherosclerosis  Patient is afebrile, WBC 20.2, hemoglobin 8.3, platelets normal, creatinine 0.34,PT/INR pend.  Request now received for image guided drainage of abdominal/pelvic fluid collection.  Imaging studies have been reviewed by Dr. Jenna.Risks and benefits discussed with the patient's son Lindsay Mason including bleeding, infection, damage to adjacent structures, bowel perforation/fistula connection, and sepsis.  All of the patient's questions were answered, patient is agreeable to proceed. Consent signed and in chart.  Procedure TENT scheduled for later today  Thank you for allowing our service to participate in WAUNETA SILVERIA 's  care.  Electronically Signed: D. Franky Rakers, PA-C   06/16/2024, 10:32 AM      I spent a total of   40 minutes  in face to face in clinical consultation, greater than 50% of which was counseling/coordinating care for image guided abdominal/pelvic fluid collection drainage

## 2024-06-16 NOTE — Progress Notes (Signed)
 PROGRESS NOTE    Lindsay Mason  FMW:[id number] DOB: 1950/12/27 DOA: 06/02/2024 PCP: [name], MD   Brief Narrative: 73 year old with past medical history significant for COPD, hyperlipidemia, history of right lower lobe adenocarcinoma treated with lobectomy 2022, peripheral artery disease, thoracic and abdominal aortic aneurysmal, dementia with behavioral disturbance presented to the hospital with positive blood culture with staph species.  Patient had lethargy and worsening mental status.  In the ED patient had slightly low blood pressure.  White blood cell was elevated at 13.  CT abdomen and pelvis done on 06/01/2024 with no acute finding but is scattered colonic diverticulosis with a small diverticulitis.  Patient was then considered for admission to the hospital for further evaluation and treatment.  During hospitalization patient continued to have abdominal distention and pain, general surgery was consulted.  Currently being treated for small bowel obstruction/ileus initially conservatively but since she did not had any improvement and had a significant CT finding underwent exploratory laparotomy with a small bowel resection 06/09/2024.  Patient is currently on TPN and awaiting for clinical improvement. Worsening WBC, repeated CT scan 9/02 showed New enhancing fluid collections in the right paracolic gutter and central pelvis worrisome for abscesses. IR consulted for drain placement.    Assessment & Plan:   Principal Problem:   SIRS (systemic inflammatory response syndrome) (HCC) Active Problems:   COPD with emphysema (HCC)   Thoracic aortic atherosclerosis (HCC)   History of essential hypertension   Severe early onset Alzheimer dementia (HCC)   PAD (peripheral artery disease) (HCC)   Confusion and disorientation   Positive blood culture   Nausea and vomiting   Abdominal pain   Hyperlipidemia   Abnormal EKG  Abdominal pain/  Nausea and vomiting: Sepsis likely secondary to  acute diverticulitis Small bowel obstruction/ileus Patient initially had leukocytosis with hypotension and lethargy and clinical suspicion for diverticulitis suggestive of possible sepsis.  Initial CT scan of the abdomen initially was negative for acute findings but had clinical symptoms of diverticulitis.   -Continue with cefepime  and metronidazole .  Blood cultures negative in  5 days.  -General surgery was consulted due to persistent abdominal distention and pain.  Initial imaging with x-ray of the abdomen showed possible SBO/ileus.  Patient was then started on conservative treatment with NG tube IV fluid with electrolyte replacement but continued to have pain and distention with NG output.  Repeat CT scan done 06/08/2024 showed significant thickening of the pelvic loops of the small bowel concerning for ischemia. -  Patient on underwent diagnostic laparoscopy, exploratory laparotomy with small bowel resection on 06/09/2024.  Was on NG tube which has come off at this time.  -On TPN, Full liquid diet.  -9/02: WBC increased to 20, CT scan ordered. CT scan showed New enhancing fluid collections in the right paracolic gutter and central pelvis worrisome for abscesses.. some finding worrisome for SBO, mesenteric air concern for Ischemic  bowel.  -Plan to check lactic acid, IR consulted for drain placement.  -Change cefepime  and flagyl  to Meropenem . Would like to avoid Cefepime  due to encephalopathy.   Hypophosphatemia. Replaced.    Hypokalemia. Replaced.    Metabolic Encephalopathy on the background of Alzheimer's dementia.   Likely secondary to infection.  On nortriptyline  trazodone  Exelon  at home..  On mittens and one-to-one sitter. Delirium precaution.  Change Cefepime  to Meropenem , to avoid Delirium   Positive blood culture with staph species. Contamination suspected.   COPD with emphysema  Continue albuterol  inhaler.   Hyperlipidemia PAD (peripheral artery  disease)  Thoracic aortic  atherosclerosis -Holding Lipitor   History of essential hypertension On as needed IV hydralazine    Weakness, deconditioning.  Seen by PT OT and recommended skilled nursing facility placement      Nutrition Problem: Inadequate oral intake Etiology:  (PSBO)    Signs/Symptoms: NPO status    Interventions: TPN  Estimated body mass index is 26.53 kg/m as calculated from the following:   Height as of this encounter: 5' 2 (1.575 m).   Weight as of this encounter: 65.8 kg.   DVT prophylaxis: Lovenox  Code Status: Full code Family Communication: Son at bedside, updated Disposition Plan:  Status is: Inpatient Remains inpatient appropriate because: management o post op     Consultants:  Surgery   Procedures:    Antimicrobials:    Subjective: She is alert, confuse, denies pain.  She them was having pain, abdominal. IV tylenol  ordered.   Objective: Vitals:   06/16/24 0151 06/16/24 0500 06/16/24 1019 06/16/24 1205  BP: (!) 120/98 115/76 124/82 103/86  Pulse: (!) 108 (!) 106 (!) 103 98  Resp: 18 18 18 20   Temp: 98.4 F (36.9 C) 98.1 F (36.7 C) 98.6 F (37 C) 98.2 F (36.8 C)  TempSrc: Axillary Axillary    SpO2: 96% 100% 100% 100%  Weight:  65.8 kg    Height:        Intake/Output Summary (Last 24 hours) at 06/16/2024 1353 Last data filed at 06/16/2024 0500 Gross per 24 hour  Intake --  Output 750 ml  Net -750 ml   Filed Weights   06/13/24 0630 06/14/24 0548 06/16/24 0500  Weight: 66.7 kg 66.4 kg 65.8 kg    Examination:  General exam: Alert Respiratory system: CTA Cardiovascular system: S 1, S 2 RRR Gastrointestinal system: BS present, soft,  mid abdomen with dressing Central nervous system: Alert Extremities: No edema   Data Reviewed: I have personally reviewed following labs and imaging studies  CBC: Recent Labs  Lab 06/11/24 0311 06/13/24 0545 06/14/24 0600 06/15/24 1206 06/16/24 0500  WBC 15.2* 11.8* 11.4* 22.0* 20.2*  HGB 11.2*  10.0* 11.0* 9.6* 8.3*  HCT 34.5* 30.9* 35.0* 29.4* 25.9*  MCV 90.6 90.1 89.5 89.4 90.6  PLT 300 297 416* 392 359   Basic Metabolic Panel: Recent Labs  Lab 06/10/24 0332 06/11/24 0311 06/13/24 0545 06/14/24 0600 06/15/24 1206 06/16/24 0500  NA 140 139 141 140 139 139  K 4.1 4.1 4.1 4.4 4.4 3.8  CL 103 102 104 105 107 109  CO2 24 28 28 25 23  21*  GLUCOSE 181* 123* 110* 132* 130* 114*  BUN 9 16 26* 24* 36* 31*  CREATININE 0.43* 0.46 0.39* 0.38* 0.48 0.34*  CALCIUM  8.1* 8.3* 8.4* 8.7* 8.5* 8.2*  MG 1.9 2.1 2.2 2.2  --   --   PHOS 2.3* 2.8 3.2 3.3  --   --    GFR: Estimated Creatinine Clearance: 55.8 mL/min (A) (by C-G formula based on SCr of 0.34 mg/dL (L)). Liver Function Tests: Recent Labs  Lab 06/10/24 0332 06/14/24 0600  AST 22 33  ALT 15 20  ALKPHOS 59 73  BILITOT 0.3 0.3  PROT 5.0* 5.3*  ALBUMIN  3.3* 3.1*   No results for input(s): LIPASE, AMYLASE in the last 168 hours. No results for input(s): AMMONIA in the last 168 hours. Coagulation Profile: Recent Labs  Lab 06/16/24 1112  INR 1.4*   Cardiac Enzymes: No results for input(s): CKTOTAL, CKMB, CKMBINDEX, TROPONINI in the last 168 hours. BNP (  last 3 results) No results for input(s): PROBNP in the last 8760 hours. HbA1C: No results for input(s): HGBA1C in the last 72 hours. CBG: Recent Labs  Lab 06/15/24 1148 06/15/24 1734 06/15/24 2334 06/16/24 0519 06/16/24 1204  GLUCAP 130* 140* 102* 111* 116*   Lipid Profile: Recent Labs    06/14/24 0600  TRIG 103   Thyroid  Function Tests: No results for input(s): TSH, T4TOTAL, FREET4, T3FREE, THYROIDAB in the last 72 hours. Anemia Panel: Recent Labs    06/15/24 0412  VITAMINB12 489   Sepsis Labs: Recent Labs  Lab 06/16/24 1112  LATICACIDVEN 0.8    Recent Results (from the past 240 hours)  MRSA Next Gen by PCR, Nasal     Status: None   Collection Time: 06/16/24  7:43 AM   Specimen: Nasal Mucosa; Nasal Swab  Result  Value Ref Range Status   MRSA by PCR Next Gen NOT DETECTED NOT DETECTED Final    Comment: (NOTE) The GeneXpert MRSA Assay (FDA approved for NASAL specimens only), is one component of a comprehensive MRSA colonization surveillance program. It is not intended to diagnose MRSA infection nor to guide or monitor treatment for MRSA infections. Test performance is not FDA approved in patients less than 74 years old. Performed at [facility], [address] [place], [place] [address]          Radiology Studies: CT ABDOMEN PELVIS W CONTRAST Result Date: 06/15/2024 CLINICAL DATA:  Complicated diverticulitis suspected. History of right lower lobe adenocarcinoma. EXAM: CT ABDOMEN AND PELVIS WITH CONTRAST TECHNIQUE: Multidetector CT imaging of the abdomen and pelvis was performed using the standard protocol following bolus administration of intravenous contrast. RADIATION DOSE REDUCTION: This exam was performed according to the departmental dose-optimization program which includes automated exposure control, adjustment of the mA and/or kV according to patient size and/or use of iterative reconstruction technique. CONTRAST:  OMNIPAQUE  IOHEXOL  300 MG/ML  SOLN COMPARISON:  CT abdomen and pelvis 06/08/2024. FINDINGS: Lower chest: Pleural effusions have resolved. There is minimal scarring or atelectasis in the left lung base. Hepatobiliary: There some fluid and stranding surrounding the gallbladder. There is no biliary ductal dilatation. The liver appear stable. There is a subcentimeter cyst or hemangioma in the peripheral right lobe. No new liver lesions are seen. Pancreas: Unremarkable. No pancreatic ductal dilatation or surrounding inflammatory changes. Spleen: Normal in size without focal abnormality. Adrenals/Urinary Tract: Small left renal cysts appear unchanged. Otherwise, the kidneys and adrenal glands are within normal limits. There is diffuse bladder wall thickening. There is a  small amount of air in the bladder. Stomach/Bowel: Patient is status post interval abdominal surgery. There is a small amount of free air in the upper abdomen likely related to recent surgery. Stomach is decompressed. There are dilated small bowel loops with air-fluid levels in the upper abdomen measuring up to 3.6 cm. Again seen are clustered small bowel loops in the pelvis with wall thickening and mesenteric edema similar to prior study. There is a small amount of air within the mesentery at this level which is new from prior. The colon is nondilated. The appendix is not seen. Vascular/Lymphatic: Aortic atherosclerosis. No enlarged abdominal or pelvic lymph nodes. Reproductive: Uterus is unremarkable. Ovaries are not well delineated on this study. Other: New enhancing fluid collection seen in the right paracolic gutter measuring 5.8 x 2.4 by 6.0 cm. New multiloculated enhancing fluid collection seen in the central pelvis distal above the bladder measuring 3.5 x 4.8 x 3.0 cm. There  is a small amount of simple free fluid in the right upper quadrant and left upper quadrant. Midline abdominal wall wound is present. There is diffuse body wall edema. There is no focal hernia. Musculoskeletal: The bones are osteopenic. There is dextroconvex curvature of the lumbar spine with multilevel degenerative change. IMPRESSION: 1. Interval abdominal surgery. There is a small amount of free air in the upper abdomen likely related to recent surgery. 2. New enhancing fluid collections in the right paracolic gutter and central pelvis worrisome for abscesses. 3. Persistent clustered small bowel loops in the pelvis with wall thickening and mesenteric edema. There is a small amount of air within the mesentery at this level which is new from prior. 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 4. Dilated small bowel loops in the upper abdomen worrisome for small bowel obstruction. 5. Diffuse bladder wall thickening with small amount of air  in the bladder. Findings may be related to recent instrumentation or infection. 6. Small amount of simple free fluid in the right upper quadrant and left upper quadrant. 7. Body wall edema. 8. Aortic atherosclerosis. Aortic Atherosclerosis (ICD10-I70.0). Electronically Signed   By: Greig Pique M.D.   On: 06/15/2024 23:44   DG CHEST PORT 1 VIEW Result Date: 06/15/2024 EXAM: 1 VIEW XRAY OF THE CHEST 06/15/2024 02:47:00 PM COMPARISON: 05/31/2024 CLINICAL HISTORY: Leukocytosis FINDINGS: LUNGS AND PLEURA: Low lung volumes. Left basilar patchy opacities. No pleural effusion. No pneumothorax. HEART AND MEDIASTINUM: No acute abnormality of the cardiac and mediastinal silhouettes. BONES AND SOFT TISSUES: No acute osseous abnormality. LINES AND TUBES: Right PICC with tip at superior cavoatrial junction. IMPRESSION: 1. Left basilar patchy opacities, possibly developing consolidation. 2. Low lung volumes. Electronically signed by: Franky Stanford MD 06/15/2024 06:31 PM EDT RP Workstation: [id number]        Scheduled Meds:  Chlorhexidine  Gluconate Cloth  6 each Topical Daily   enoxaparin  (LOVENOX ) injection  40 mg Subcutaneous Q24H   feeding supplement  1 Container Oral TID BM   feeding supplement  237 mL Oral BID BM   insulin  aspart  0-9 Units Subcutaneous Q6H   LORazepam   0.5 mg Intravenous Once   methocarbamol  (ROBAXIN ) injection  500 mg Intravenous Q8H   rivastigmine   9.5 mg Transdermal Daily   sodium chloride  flush  10-40 mL Intracatheter Q12H   Continuous Infusions:  lactated ringers  30 mL/hr at 06/15/24 1714   meropenem  (MERREM ) IV     TPN ADULT (ION) 80 mL/hr at 06/15/24 1855   TPN ADULT (ION)       LOS: 14 days    Time spent: 35 minutes    Promiss Labarbera A Joshaua Epple, MD [facility]   If 7PM-7AM, please contact night-coverage [email or web address]  06/16/2024, 1:53 PM

## 2024-06-16 NOTE — Progress Notes (Signed)
 PHARMACY - TOTAL PARENTERAL NUTRITION CONSULT NOTE   Indication: Prolonged ileus  Patient Measurements: Height: 5' 2 (157.5 cm) Weight: 65.8 kg (145 lb 1 oz) IBW/kg (Calculated) : 50.1 TPN AdjBW (KG): 61.3 Body mass index is 26.53 kg/m.   Assessment: Presented to ED with N/V/D and abdominal pain x 3d on 05/31/24. CT negative. Possibly from diverticulitis and placed on Cefepime /Flagyl . Concern for SBO, NGT placed. Continues to have persistent pain and distention. No significant intake since 8/21. Pharmacy consulted to start TPN on 8/26.  S/p ex lap with small bowel resection.  Glucose / Insulin : No h/o DM -CBGs within goal, range 102-140 -3 units SSI/24 hrs Electrolytes: WNL, including coCa (8.9) Renal: Scr <1, BUN slightly elevated Hepatic: LFTs WNL; alb 3.1; TG 103 Intake / Output; MIVF:  -Back to NPO 9/2 -UOP likely not fully documented -LBM 9/2  GI Imaging: 8/19 CT: Scattered colonic diverticulosis. No active diverticulitis.   8/22, 8/23, 8/25, 8/26 Abd Xray: all SBO  8/26 CT: severely thickened and inflamed central pelvic loops of small bowel concerning for ischemia. Concern for mesenteric volvulus or closed loop obstruction.  9/2 CT: 1. Interval abdominal surgery. There is a small amount of free air in the upper abdomen likely related to recent surgery. 2. New enhancing fluid collections in the right paracolic gutter and central pelvis worrisome for abscesses. 3. Persistent clustered small bowel loops in the pelvis with wall thickening and mesenteric edema. There is a small amount of air within the mesentery at this level which is new from prior. 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 4. Dilated small bowel loops in the upper abdomen worrisome for small bowel obstruction. 5. Diffuse bladder wall thickening with small amount of air in the bladder. Findings may be related to recent instrumentation or infection. 6. Small amount of simple free fluid in the right upper quadrant  and left upper quadrant. 7. Body wall edema. 8. Aortic atherosclerosis.  GI Surgeries / Procedures:  8/27 Ex lap with small bowel resection  Central access: PICC TPN start date: 8/26  Nutritional Goals: Goal TPN rate is 80 mL/hr (provides 86 g of protein and  1759 kcals per day)  RD Assessment: Kcal 1650-1850 Protein 75-90 g Fluid 1.8 L/day  Current Nutrition:  Previously advanced to soft diet Back to NPO 9/3  Plan:  -Continue TPN at goal 80 mL/hr -Electrolytes in TPN:  Na 50 mEq/L K 40 mEq/L Ca 5 mEq/L Mg 5 mEq/L Phos 15 mmol/L Cl:Ac 1:2 -Add standard MVI and trace elements to TPN -Continue Sensitive q6h SSI and adjust as needed  -Monitor TPN labs on Mon/Thurs, and PRN -Note diet back to NPO this morning; CT a/p from yesterday worrisome for abscesses pending eval for possible drain placement   Stefano MARLA Bologna, PharmD, BCPS Clinical Pharmacist 06/16/2024 8:15 AM

## 2024-06-16 NOTE — Progress Notes (Signed)
 Physical Therapy Treatment Patient Details Name: Lindsay Mason MRN: [id number] DOB: [birth year] Today's Date: 06/16/2024   History of Present Illness Pt is 73 yo female presented on 06/02/24  with + blood culture with staph species, lethargy, and worsening mental status. Pt admitted with SIRS, abdominal pain, N/V likely secondary to acute diverticulitis, and SBO vs ileus.  Pt with NG tube and surgery following. s/p exploratory lap with SB resection 2* ischemic bowel 06/09/24.   Pt also with metabolic encephalopathy on background of Alzheimer's dementia.  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  Son present so had him give commands as his voice is familiar to Pt. Assisted to sitting EOB.  General bed mobility comments: with Son assisting giving commands and + 2 Max assist, Pt was able to transition to EOB using bed pad to complete scooting.  Pt fearful.  Allowed increased time to sit EOB and used distration technigues to decrease her fear/anxiety.  Pt was able to static sit EOB x 5 min at Supervision level.  Facial grimacing for ABD pain and constant UE movements of moving/adjusting gown, IV line with some self scratching her back.  Pt responded well to Son's calming voice. Assisted with amb.  General Gait Details: assisted with amb in hallway required + 2 assist for stability, safety and Son giving commands to encouge and diect Pt.  Poor balance with posterior lean and assist to advance, direct walker.  Walker used for stability and guidance not for a goal due to her Hx Dementia.  Gait pattern is unsteady, unequal and Pt presents with poor self correction.  Prior to admit, family reported she loves to walk and was walking good distances outside  with no AD. General transfer comment: Assisting on/off BSC was difficult due to impaired attention/focus/cognition. Pt did express and need to use the bathroom and did wait to void till completely placed on seat. Moving from [facility] to recliner was difficult and required + 2 assist to ensure turn completion and center self. Pt fearful and required repeat, simple commands. Positioned in recliner to comfort.   LPT has rec Pt will need ST Rehab at SNF to address mobility and functional decline prior to safely returning home.    If plan is discharge home, recommend the following: Two people to help with walking and/or transfers;A lot of help with bathing/dressing/bathroom;Assistance with cooking/housework;Assist for transportation;Help with stairs or ramp for entrance;Direct supervision/assist for medications management;Direct supervision/assist for financial management;Supervision due to cognitive status   Can travel by private vehicle     No  Equipment Recommendations  None recommended by PT    Recommendations for Other Services       Precautions / Restrictions Precautions Precautions: Fall Precaution/Restrictions Comments: abdominal sx, dementia Restrictions Weight Bearing Restrictions Per Provider Order: No     Mobility  Bed Mobility Overal bed mobility: Needs Assistance Bed Mobility: Supine to Sit     Supine to sit: Max assist, +2 for physical assistance, +2 for safety/equipment     General bed mobility comments: with Son assisting giving commands and + 2 Max assist, Pt was able to transition to EOB using bed pad to complete scooting.  Pt fearful.  Allowed increased time to sit EOB and used distration technigues to decrease her fear/anxiety.  Pt was able to static sit EOB x 5 min at Supervision level.  Facial grimacing for ABD pain and constant UE movements of moving/adjusting gown, IV line with some self scratching her back.  Pt responded well to Son's calming voice.    Transfers                    General transfer comment: Assisting on/off BSC was difficult due to impaired attention/focus/cognition. Pt did express and need to use the bathroom and did wait to void till completely placed on seat. Moving from [facility] to recliner was difficult and required + 2 assist to ensure turn completion and center self. Pt fearful and required repeat, simple commands.    Ambulation/Gait Ambulation/Gait assistance: Mod assist, +2 physical assistance, +2 safety/equipment Gait Distance (Feet): 34 Feet Assistive device: Rolling walker (2 wheels) Gait Pattern/deviations: Step-through pattern, Decreased stride length Gait velocity: decreased     General Gait Details: assisted with amb in hallway required + 2 assist for stability, safety and Son giving commands to encouge and diect Pt.  Poor balance with posterior lean and assist to advance, direct walker.  Walker used for stability and guidance not for a goal due to her Hx Dementia.  Gait pattern is unsteady, unequal and Pt presents with poor self correction.  Prior to admit, family reported she loves to walk and was walking good distances outside with no AD.   Stairs             Wheelchair Mobility     Tilt Bed    Modified Rankin (Stroke Patients Only)       Balance                                            Communication Communication Communication: Impaired Factors Affecting Communication: Difficulty expressing self  Cognition Arousal: Alert Behavior During Therapy: Restless   PT - Cognitive impairments: History of cognitive impairments                       PT - Cognition Comments: AxO x 1 following simple repeat commands.  Son present so had him give commands as his voice is familiar to Pt. Following commands: Impaired Following commands impaired: Follows one step commands inconsistently    Cueing Cueing Techniques: Verbal cues, Gestural cues, Tactile cues, Visual cues   Exercises      General Comments        Pertinent Vitals/Pain Pain Assessment Pain Assessment: Faces Faces Pain Scale: Hurts a little bit Pain Location: abdomen with movement Pain Descriptors / Indicators: Guarding, Grimacing, Tender, Sore Pain Intervention(s): Monitored during session    Home Living                          Prior Function            PT Goals (current goals can now be found in the care plan section) Progress towards PT goals: Progressing toward goals    Frequency    Min 2X/week      PT Plan      Co-evaluation              AM-PAC PT 6 Clicks Mobility   Outcome Measure  Help needed turning from your back to your side while in a flat bed without using bedrails?:  A Lot Help needed moving from lying on your back to sitting on the side of a flat bed without using bedrails?: A Lot Help needed moving to and from a bed to a chair (including a wheelchair)?: A Lot Help needed standing up from a chair using your arms (e.g., wheelchair or bedside chair)?: A Lot Help needed to walk in hospital room?: A Lot Help needed climbing 3-5 steps with a railing? : Total 6 Click Score: 11    End of Session Equipment Utilized During Treatment: Gait belt Activity Tolerance: Patient tolerated treatment well Patient left: in chair;with chair alarm set;with call bell/phone within reach;with family/visitor present Nurse Communication: Mobility status PT Visit Diagnosis: Difficulty in walking, not elsewhere classified (R26.2)     Time: 8879-8852 PT Time Calculation (min) (ACUTE ONLY): 27 min  Charges:    $Gait Training: 8-22 mins $Therapeutic Activity: 8-22 mins PT General Charges $$ ACUTE PT VISIT: 1 Visit                     Katheryn Leap  PTA Acute  Rehabilitation Services Office M-F          [phone]

## 2024-06-16 NOTE — Progress Notes (Addendum)
 Occupational Therapy Treatment Patient Details Name: Lindsay Mason MRN: [id number] DOB: 03-05-1951 Today's Date: 06/16/2024   History of present illness Pt is 73 yo female presented on 06/02/24  with + blood culture with staph species, lethargy, and worsening mental status. Pt admitted with SIRS, abdominal pain, N/V likely secondary to acute diverticulitis, and SBO vs ileus.  Pt with NG tube and surgery following. s/p exploratory lap with SB resection 2* ischemic bowel 06/09/24.   Pt also with metabolic encephalopathy on background of Alzheimer's dementia.  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 Son present for session with patient with increased overall response to therapy and all activity presented. High interest grooming and hair washing completed as patient with knotted/tangled hair with OT developing better rapport with family present. Continued to require redirection and cues but able to follow most 1 step commands this session with less abdominal antalgia overall for self care tasks. Set up and moved for OOB with patient having BM. NT handoff and OT to return later am to progress to OOB. Patient requires continued Acute care hospital level OT services to progress safety and functional performance and allow for discharge. Patient will benefit from continued inpatient follow up therapy, <3 hours/day.         If plan is discharge home, recommend the following:  Two people to help with walking and/or transfers;Two people to help with bathing/dressing/bathroom;Assistance with feeding;Direct supervision/assist for medications management;Assistance with cooking/housework;Direct supervision/assist for financial management;Assist for  transportation;Help with stairs or ramp for entrance;Supervision due to cognitive status   Equipment Recommendations  Other (comment) (TBD post rehab)       Precautions / Restrictions Precautions Precautions: Fall Recall of Precautions/Restrictions: Impaired Precaution/Restrictions Comments: abdominal sx, dementia Restrictions Weight Bearing Restrictions Per Provider Order: No       Mobility Bed Mobility Overal bed mobility: Needs Assistance Bed Mobility: Rolling, Sidelying to Sit, Sit to Sidelying Rolling: Mod assist, +2 for physical assistance, +2 for safety/equipment, Used rails         General bed mobility comments: BM noted upon OT attempting OOB           ADL either performed or assessed with clinical judgement   ADL Overall ADL's : Needs assistance/impaired Eating/Feeding: NPO Eating/Feeding Details (indicate cue type and reason):  (only sips for meds allowed this session) Grooming: Wash/dry hands;Wash/dry face;Oral care;Brushing hair;Minimal assistance;Bed level Grooming Details (indicate cue type and reason): spent extended time with son and patient to promote activity patient has high interest in including hair washing and detangling                             Functional mobility during ADLs:  (set up recliner and attempted OOB with patient soiled, NT aware) General ADL Comments: improved for basic 1  step high interest grooming tasks    Extremity/Trunk Assessment Upper Extremity Assessment Upper Extremity Assessment: Generalized weakness;Right hand dominant   Lower Extremity Assessment Lower Extremity Assessment: Defer to PT evaluation        Vision   Vision Assessment?: No apparent visual deficits         Communication Communication Communication: Impaired Factors Affecting Communication: Difficulty expressing self   Cognition Arousal: Alert Behavior During Therapy: Restless,  Agitated, Impulsive, Anxious Cognition: History of  cognitive impairments             OT - Cognition Comments: son present with improved focus for basic activity, continues A/O x1, history of dementia, poor attention this session, low frustration tolerance, problem solving, decreased safety awareness, impulsive                 Following commands: Impaired Following commands impaired: Follows one step commands inconsistently      Cueing   Cueing Techniques: Verbal cues, Gestural cues, Tactile cues, Visual cues        General Comments On supplemental O2 this session    Pertinent Vitals/ Pain       Pain Assessment Pain Assessment: Faces Breathing: occasional labored breathing, short period of hyperventilation Negative Vocalization: occasional moan/groan, low speech, negative/disapproving quality Facial Expression: sad, frightened, frown Body Language: tense, distressed pacing, fidgeting Consolability: distracted or reassured by voice/touch PAINAD Score: 5 Pain Location: abdomen with movement Pain Descriptors / Indicators: Guarding, Grimacing, Tender, Sore, Moaning Pain Intervention(s): Limited activity within patient's tolerance, Premedicated before session, Monitored during session, Repositioned, Relaxation   Frequency  Min 2X/week        Progress Toward Goals  OT Goals(current goals can now be found in the care plan section)  Progress towards OT goals: Progressing toward goals  Acute Rehab OT Goals Patient Stated Goal: patient unable to state OT Goal Formulation: With family Time For Goal Achievement: 06/29/24 Potential to Achieve Goals: Fair ADL Goals Pt Will Perform Upper Body Dressing: with min assist;sitting Pt Will Perform Lower Body Dressing: with mod assist;sitting/lateral leans Pt Will Transfer to Toilet: with min assist;bedside commode;stand pivot transfer Pt Will Perform Toileting - Clothing Manipulation and hygiene: with min assist;sitting/lateral leans  Plan         AM-PAC OT 6 Clicks Daily  Activity     Outcome Measure   Help from another person eating meals?: A Lot Help from another person taking care of personal grooming?: A Lot Help from another person toileting, which includes using toliet, bedpan, or urinal?: Total Help from another person bathing (including washing, rinsing, drying)?: Total Help from another person to put on and taking off regular upper body clothing?: A Lot Help from another person to put on and taking off regular lower body clothing?: Total 6 Click Score: 9    End of Session Equipment Utilized During Treatment:  (none required for this visit)  OT Visit Diagnosis: Unsteadiness on feet (R26.81);Other abnormalities of gait and mobility (R26.89);Muscle weakness (generalized) (M62.81);Feeding difficulties (R63.3);Other symptoms and signs involving cognitive function;Cognitive communication deficit (R41.841);Pain Pain - part of body:  (abdomen)   Activity Tolerance Patient limited by pain   Patient Left in bed;with call bell/phone within reach;with bed alarm set;with family/visitor present   Nurse Communication Mobility status;Other (comment) (plan for OOB later am with OT)        Time: 9099-9044 OT Time Calculation (min): 55 min  Charges: OT General Charges $OT Visit: 1 Visit OT Treatments $Self Care/Home Management : 38-52 mins  Rabab Currington OT/L Acute Rehabilitation Department  [phone]  06/16/2024, 11:57 AM

## 2024-06-16 NOTE — TOC Progression Note (Signed)
 Transition of Care [facility]) - Progression Note    Patient Details  Name: Lindsay Mason MRN: [id number] Date of Birth: [birth year]  [facility]) CM/SW Contact  Tayden Duran, Nathanel, RN Phone Number: 06/16/2024, 10:25 AM  Clinical Narrative: Has bed offers but will hold off w/choices since continued  w/treatment-TPN, IV abx,sx following may need aspirate vs drainage.     Expected Discharge Plan: Skilled Nursing Facility Barriers to Discharge: Continued Medical Work up               Expected Discharge Plan and Services In-house Referral: Clinical Social Work Discharge Planning Services: CM Consult Post Acute Care Choice: Skilled Nursing Facility                   DME Arranged: N/A DME Agency: NA                   Social Drivers of Health (SDOH) Interventions SDOH Screenings   Food Insecurity: No Food Insecurity (06/02/2024)  Housing: Low Risk  (06/02/2024)  Transportation Needs: No Transportation Needs (06/02/2024)  Utilities: Not At Risk (06/02/2024)  Alcohol Screen: Low Risk  (05/05/2024)  Depression (PHQ2-9): High Risk (05/07/2024)  Financial Resource Strain: Low Risk  (05/05/2024)  Physical Activity: Sufficiently Active (05/05/2024)  Social Connections: Moderately Isolated (06/02/2024)  Stress: No Stress Concern Present (05/05/2024)  Tobacco Use: Medium Risk (06/09/2024)  Health Literacy: Inadequate Health Literacy (05/05/2024)    Readmission Risk Interventions    06/14/2024    2:25 PM 01/11/2024   10:01 AM  Readmission Risk Prevention Plan  Post Dischage Appt  Complete  Medication Screening  Complete  Transportation Screening Complete Complete  Medication Review Oceanographer) Complete   HRI or Home Care Consult Complete   SW Recovery Care/Counseling Consult Complete   Palliative Care Screening Not Applicable   Skilled Nursing Facility Complete

## 2024-06-16 NOTE — Procedures (Signed)
 Pre procedural Dx: RLQ abscess Post procedural Dx: Same  Technically successful CT guided placed of a 10 Fr drainage catheter placement into the RLQ abscess yielding pus.    A representative aspirated sample was capped and sent to the laboratory for analysis.    EBL: Trace Complications: None immediate  [name] Banner, MD Pager #: [phone]

## 2024-06-16 NOTE — TOC Progression Note (Signed)
 Transition of Care [facility]) - Progression Note    Patient Details  Name: Lindsay Mason MRN: [id number] Date of Birth: 12/10/1950  [facility]) CM/SW Contact  Teri Diltz, Nathanel, RN Phone Number: 06/16/2024, 12:36 PM  Clinical Narrative: spoke to dtr [name], & son [name] about bed offers, provided w/list-they will review. Depending on continued treatment may need to update fl2.   [facility] Preferred SNF  Accepted -- [address], [place] [address] [phone] --  [facility] Preferred SNF  Accepted -- [address], [place] [address] [phone] --  [facility]  Accepted -- [address] [place] [address] [phone] --  [facility]  Accepted -- [address] [place], [place] [address] [phone] --  [facility]  Accepted -- [address] [place] [address] [phone] --  [facility]  Accepted -- [address] [place] [address] [phone]        Expected Discharge Plan: Skilled Nursing Facility Barriers to Discharge: Continued Medical Work up               Expected Discharge Plan and Services In-house Referral: Clinical Social Work Discharge Planning Services: CM Consult Post Acute Care Choice: Skilled Nursing Facility                   DME Arranged: N/A DME Agency: NA                   Social Drivers of Health (SDOH) Interventions SDOH Screenings   Food Insecurity: No Food Insecurity (06/02/2024)  Housing: Low Risk  (06/02/2024)  Transportation Needs: No Transportation Needs (06/02/2024)  Utilities: Not At Risk (06/02/2024)  Alcohol Screen: Low Risk  (05/05/2024)  Depression (PHQ2-9): High Risk (05/07/2024)  Financial Resource Strain: Low Risk  (05/05/2024)  Physical Activity: Sufficiently Active (05/05/2024)  Social Connections: Moderately Isolated  (06/02/2024)  Stress: No Stress Concern Present (05/05/2024)  Tobacco Use: Medium Risk (06/09/2024)  Health Literacy: Inadequate Health Literacy (05/05/2024)    Readmission Risk Interventions    06/14/2024    2:25 PM 01/11/2024   10:01 AM  Readmission Risk Prevention Plan  Post Dischage Appt  Complete  Medication Screening  Complete  Transportation Screening Complete Complete  Medication Review Oceanographer) Complete   HRI or Home Care Consult Complete   SW Recovery Care/Counseling Consult Complete   Palliative Care Screening Not Applicable   Skilled Nursing Facility Complete

## 2024-06-17 DIAGNOSIS — R6510 Systemic inflammatory response syndrome (SIRS) of non-infectious origin without acute organ dysfunction: Secondary | ICD-10-CM | POA: Diagnosis not present

## 2024-06-17 DIAGNOSIS — E785 Hyperlipidemia, unspecified: Secondary | ICD-10-CM | POA: Diagnosis not present

## 2024-06-17 DIAGNOSIS — J449 Chronic obstructive pulmonary disease, unspecified: Secondary | ICD-10-CM | POA: Diagnosis not present

## 2024-06-17 DIAGNOSIS — I739 Peripheral vascular disease, unspecified: Secondary | ICD-10-CM | POA: Diagnosis not present

## 2024-06-17 LAB — CBC
HCT: 24.3 % — ABNORMAL LOW (ref 36.0–46.0)
Hemoglobin: 8 g/dL — ABNORMAL LOW (ref 12.0–15.0)
MCH: 29.5 pg (ref 26.0–34.0)
MCHC: 32.9 g/dL (ref 30.0–36.0)
MCV: 89.7 fL (ref 80.0–100.0)
Platelets: 388 K/uL (ref 150–400)
RBC: 2.71 MIL/uL — ABNORMAL LOW (ref 3.87–5.11)
RDW: 14.8 % (ref 11.5–15.5)
WBC: 16.6 K/uL — ABNORMAL HIGH (ref 4.0–10.5)
nRBC: 0 % (ref 0.0–0.2)

## 2024-06-17 LAB — COMPREHENSIVE METABOLIC PANEL WITH GFR
ALT: 20 U/L (ref 0–44)
AST: 23 U/L (ref 15–41)
Albumin: 2.5 g/dL — ABNORMAL LOW (ref 3.5–5.0)
Alkaline Phosphatase: 101 U/L (ref 38–126)
Anion gap: 8 (ref 5–15)
BUN: 23 mg/dL (ref 8–23)
CO2: 23 mmol/L (ref 22–32)
Calcium: 8.2 mg/dL — ABNORMAL LOW (ref 8.9–10.3)
Chloride: 110 mmol/L (ref 98–111)
Creatinine, Ser: 0.36 mg/dL — ABNORMAL LOW (ref 0.44–1.00)
GFR, Estimated: >60 mL/min (ref >60)
Glucose, Bld: 117 mg/dL — ABNORMAL HIGH (ref 70–99)
Potassium: 3.9 mmol/L (ref 3.5–5.1)
Sodium: 140 mmol/L (ref 135–145)
Total Bilirubin: 0.4 mg/dL (ref 0.0–1.2)
Total Protein: 4.9 g/dL — ABNORMAL LOW (ref 6.5–8.1)

## 2024-06-17 LAB — GLUCOSE, CAPILLARY
Glucose-Capillary: 115 mg/dL — ABNORMAL HIGH (ref 70–99)
Glucose-Capillary: 115 mg/dL — ABNORMAL HIGH (ref 70–99)
Glucose-Capillary: 117 mg/dL — ABNORMAL HIGH (ref 70–99)
Glucose-Capillary: 156 mg/dL — ABNORMAL HIGH (ref 70–99)

## 2024-06-17 LAB — MAGNESIUM: Magnesium: 2.1 mg/dL (ref 1.7–2.4)

## 2024-06-17 LAB — PHOSPHORUS: Phosphorus: 2.6 mg/dL (ref 2.5–4.6)

## 2024-06-17 MED ORDER — TRAMADOL HCL 50 MG PO TABS: 25.0000 mg | ORAL_TABLET | Freq: Four times a day (QID) | ORAL | Status: DC | PRN

## 2024-06-17 MED ORDER — TRAVASOL 10 % IV SOLN
INTRAVENOUS | Status: AC
  Filled 2024-06-17: qty 864

## 2024-06-17 MED ORDER — ACETAMINOPHEN 160 MG/5ML PO SOLN
500.0000 mg | Freq: Three times a day (TID) | ORAL | Status: AC
  Administered 2024-06-17 – 2024-06-19 (×9): 500 mg via ORAL
  Filled 2024-06-17 (×9): qty 20.3

## 2024-06-17 NOTE — Progress Notes (Addendum)
 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  Patient had lethargy and worsening mental status.  In the ED patient had slightly low blood pressure.  White blood cell was elevated at 13.  CT abdomen and pelvis done on 06/01/2024 with no acute finding but is scattered colonic diverticulosis with a small diverticulitis.  Patient was then considered for admission to the hospital for further evaluation and treatment.  During hospitalization patient continued to have abdominal distention and pain, general surgery was consulted.  Currently being treated for small bowel obstruction/ileus initially conservatively but since she did not had any improvement and had a significant CT finding underwent exploratory laparotomy with a small bowel resection 06/09/2024.  Patient is currently on TPN and awaiting for clinical improvement. Worsening WBC, repeated CT scan 9/02 showed New enhancing fluid collections in the right paracolic gutter and central pelvis worrisome for abscesses. IR consulted for drain placement. Underwent drain placement on 9/03.   Assessment & Plan:   Principal Problem:   SIRS (systemic inflammatory response syndrome) (HCC) Active Problems:   COPD with emphysema (HCC)   Thoracic aortic atherosclerosis (HCC)   History of essential hypertension   Severe early onset Alzheimer dementia (HCC)   PAD (peripheral artery disease) (HCC)   Confusion and disorientation   Positive blood culture   Nausea and vomiting   Abdominal pain   Hyperlipidemia   Abnormal EKG  Abdominal pain/  Nausea and  vomiting: Sepsis likely secondary to acute diverticulitis Small bowel obstruction/ileus -Patient initially had leukocytosis with hypotension and lethargy and clinical suspicion for diverticulitis suggestive of possible sepsis.  Initial CT scan of the abdomen initially was negative for acute findings but had clinical symptoms of diverticulitis.   --Treated initially with  cefepime  and metronidazole  14 days.  Blood cultures negative. -General surgery was consulted due to persistent abdominal distention and pain.  Initial imaging with x-ray of the abdomen showed possible SBO/ileus.  Patient was then started on conservative treatment with NG tube IV fluid with electrolyte replacement but continued to have pain and distention with NG output.  Repeat CT scan done 06/08/2024 showed significant thickening of the pelvic loops of the small bowel concerning for ischemia. - Patient on underwent diagnostic laparoscopy, exploratory laparotomy with small bowel resection on 06/09/2024.  Was on NG tube which has come off at this time.  -On TPN, start regular diet.  -9/02: WBC increased to 20, CT scan ordered. CT scan showed New enhancing fluid collections in the right paracolic gutter and central pelvis worrisome for abscesses.. some finding worrisome for SBO, mesenteric air concern for Ischemic  bowel.  -Lactic acid normal.  -underwent drain placement by IR on 9/03. Follow culture results.  -9/03; Change cefepime  and flagyl  to Meropenem . Would like to avoid Cefepime  due to encephalopathy.  -WBC trending down.   Hypophosphatemia. Replaced.    Hypokalemia. Replaced.    Metabolic Encephalopathy on the background of Alzheimer's dementia.   Likely secondary to infection.  On nortriptyline  trazodone  Exelon  at home..  On mittens and one-to-one sitter. Delirium precaution.  Change Cefepime  to Meropenem , to avoid Delirium   Positive blood culture with staph species. Contamination suspected.   COPD with  emphysema   Continue albuterol  inhaler.   Hyperlipidemia PAD (peripheral artery disease)  Thoracic aortic atherosclerosis -Holding Lipitor   History of essential hypertension On as needed IV hydralazine    Weakness, deconditioning.  Seen by PT OT and recommended skilled nursing facility placement    Dysphagia; not eating much. Son report she is having issues with coordinating swallowing. Speech consults.    Nutrition Problem: Inadequate oral intake Etiology:  (PSBO)    Signs/Symptoms: NPO status    Interventions: TPN  Estimated body mass index is 28.67 kg/m as calculated from the following:   Height as of this encounter: 5' 2 (1.575 m).   Weight as of this encounter: 71.1 kg.   DVT prophylaxis: Lovenox  Code Status: Full code Family Communication: Son at bedside, updated Disposition Plan:  Status is: Inpatient Remains inpatient appropriate because: management o post op     Consultants:  Surgery   Procedures:    Antimicrobials:    Subjective: She is more alert today, still confused.   Objective: Vitals:   06/16/24 1710 06/16/24 1735 06/16/24 1952 06/17/24 0500  BP: 124/74 118/77 104/78   Pulse: 94 92 (!) 101   Resp: 18  20   Temp:   98.9 F (37.2 C)   TempSrc:      SpO2: 99% 100%    Weight:    71.1 kg  Height:        Intake/Output Summary (Last 24 hours) at 06/17/2024 0810 Last data filed at 06/17/2024 0100 Gross per 24 hour  Intake 2546.98 ml  Output 600 ml  Net 1946.98 ml   Filed Weights   06/14/24 0548 06/16/24 0500 06/17/24 0500  Weight: 66.4 kg 65.8 kg 71.1 kg    Examination:  General exam: Alert Respiratory system: CTA Cardiovascular system: S 1, S 2 RRR Gastrointestinal system: BS present, soft, open wound mid-abdomen with clean dressing.  Central nervous system: More alert today  Extremities: No edema   Data Reviewed: I have personally reviewed following labs and imaging studies  CBC: Recent Labs  Lab 06/13/24 0545 06/14/24 0600  06/15/24 1206 06/16/24 0500 06/17/24 0311  WBC 11.8* 11.4* 22.0* 20.2* 16.6*  HGB 10.0* 11.0* 9.6* 8.3* 8.0*  HCT 30.9* 35.0* 29.4* 25.9* 24.3*  MCV 90.1 89.5 89.4 90.6 89.7  PLT 297 416* 392 359 388   Basic Metabolic Panel: Recent Labs  Lab 06/11/24 0311 06/13/24 0545 06/14/24 0600 06/15/24 1206 06/16/24 0500 06/17/24 0311  NA 139 141 140 139 139 140  K 4.1 4.1 4.4 4.4 3.8 3.9  CL 102 104 105 107 109 110  CO2 28 28 25 23  21* 23  GLUCOSE 123* 110* 132* 130* 114* 117*  BUN 16 26* 24* 36* 31* 23  CREATININE 0.46 0.39* 0.38* 0.48 0.34* 0.36*  CALCIUM  8.3* 8.4* 8.7* 8.5* 8.2* 8.2*  MG 2.1 2.2 2.2  --   --  2.1  PHOS 2.8 3.2 3.3  --   --  2.6   GFR: Estimated Creatinine Clearance: 57.8 mL/min (A) (by C-G formula based on SCr of 0.36 mg/dL (L)). Liver Function Tests: Recent Labs  Lab 06/14/24 0600 06/17/24 0311  AST 33 23  ALT 20 20  ALKPHOS 73 101  BILITOT 0.3 0.4  PROT 5.3* 4.9*  ALBUMIN  3.1* 2.5*   No results for input(s): LIPASE, AMYLASE in the last 168 hours. No results for input(s): AMMONIA in the last 168 hours. Coagulation Profile: Recent Labs  Lab 06/16/24 1112  INR 1.4*   Cardiac Enzymes: No results  for input(s): CKTOTAL, CKMB, CKMBINDEX, TROPONINI in the last 168 hours. BNP (last 3 results) No results for input(s): PROBNP in the last 8760 hours. HbA1C: No results for input(s): HGBA1C in the last 72 hours. CBG: Recent Labs  Lab 06/16/24 1204 06/16/24 1741 06/16/24 2335 06/17/24 0015 06/17/24 0539  GLUCAP 116* 107* 65* 156* 115*   Lipid Profile: No results for input(s): CHOL, HDL, LDLCALC, TRIG, CHOLHDL, LDLDIRECT in the last 72 hours.  Thyroid  Function Tests: No results for input(s): TSH, T4TOTAL, FREET4, T3FREE, THYROIDAB in the last 72 hours. Anemia Panel: Recent Labs    06/15/24 0412  VITAMINB12 489   Sepsis Labs: Recent Labs  Lab 06/16/24 1112  LATICACIDVEN 0.8    Recent Results (from  the past 240 hours)  MRSA Next Gen by PCR, Nasal     Status: None   Collection Time: 06/16/24  7:43 AM   Specimen: Nasal Mucosa; Nasal Swab  Result Value Ref Range Status   MRSA by PCR Next Gen NOT DETECTED NOT DETECTED Final    Comment: (NOTE) The GeneXpert MRSA Assay (FDA approved for NASAL specimens only), is one component of a comprehensive MRSA colonization surveillance program. It is not intended to diagnose MRSA infection nor to guide or monitor treatment for MRSA infections. Test performance is not FDA approved in patients less than 34 years old. Performed at [facility], [address] [place], [place] [address]   Aerobic/Anaerobic Culture w Gram Stain (surgical/deep wound)     Status: None (Preliminary result)   Collection Time: 06/16/24  5:14 PM   Specimen: Abscess  Result Value Ref Range Status   Specimen Description   Final    ABSCESS Performed at [facility], [address] [place], [place] [address]    Special Requests   Final    ABDOMEN Performed at [facility], [address] [place], [place] [address]    Gram Stain   Final    ABUNDANT WBC PRESENT, PREDOMINANTLY PMN NO ORGANISMS SEEN Performed at [facility], [address] [place], [place] [address]    Culture PENDING  Incomplete   Report Status PENDING  Incomplete         Radiology Studies: CT ABDOMEN PELVIS W CONTRAST Result Date: 06/15/2024 CLINICAL DATA:  Complicated diverticulitis suspected. History of right lower lobe adenocarcinoma. EXAM: CT ABDOMEN AND PELVIS WITH CONTRAST TECHNIQUE: Multidetector CT imaging of the abdomen and pelvis was performed using the standard protocol following bolus administration of intravenous contrast. RADIATION DOSE REDUCTION: This exam was performed according to the departmental dose-optimization program which includes automated exposure control, adjustment of the mA and/or kV according to patient  size and/or use of iterative reconstruction technique. CONTRAST:  OMNIPAQUE  IOHEXOL  300 MG/ML  SOLN COMPARISON:  CT abdomen and pelvis 06/08/2024. FINDINGS: Lower chest: Pleural effusions have resolved. There is minimal scarring or atelectasis in the left lung base. Hepatobiliary: There some fluid and stranding surrounding the gallbladder. There is no biliary ductal dilatation. The liver appear stable. There is a subcentimeter cyst or hemangioma in the peripheral right lobe. No new liver lesions are seen. Pancreas: Unremarkable. No pancreatic ductal dilatation or surrounding inflammatory changes. Spleen: Normal in size without focal abnormality. Adrenals/Urinary Tract: Small left renal cysts appear unchanged. Otherwise, the kidneys and adrenal glands are within normal limits. There is diffuse bladder wall thickening. There is a small amount of air in the bladder. Stomach/Bowel: Patient is status post interval abdominal surgery. There is  a small amount of free air in the upper abdomen likely related to recent surgery. Stomach is decompressed. There are dilated small bowel loops with air-fluid levels in the upper abdomen measuring up to 3.6 cm. Again seen are clustered small bowel loops in the pelvis with wall thickening and mesenteric edema similar to prior study. There is a small amount of air within the mesentery at this level which is new from prior. The colon is nondilated. The appendix is not seen. Vascular/Lymphatic: Aortic atherosclerosis. No enlarged abdominal or pelvic lymph nodes. Reproductive: Uterus is unremarkable. Ovaries are not well delineated on this study. Other: New enhancing fluid collection seen in the right paracolic gutter measuring 5.8 x 2.4 by 6.0 cm. New multiloculated enhancing fluid collection seen in the central pelvis distal above the bladder measuring 3.5 x 4.8 x 3.0 cm. There is a small amount of simple free fluid in the right upper quadrant and left upper quadrant. Midline  abdominal wall wound is present. There is diffuse body wall edema. There is no focal hernia. Musculoskeletal: The bones are osteopenic. There is dextroconvex curvature of the lumbar spine with multilevel degenerative change. IMPRESSION: 1. Interval abdominal surgery. There is a small amount of free air in the upper abdomen likely related to recent surgery. 2. New enhancing fluid collections in the right paracolic gutter and central pelvis worrisome for abscesses. 3. Persistent clustered small bowel loops in the pelvis with wall thickening and mesenteric edema. There is a small amount of air within the mesentery at this level which is new from prior. 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 4. Dilated small bowel loops in the upper abdomen worrisome for small bowel obstruction. 5. Diffuse bladder wall thickening with small amount of air in the bladder. Findings may be related to recent instrumentation or infection. 6. Small amount of simple free fluid in the right upper quadrant and left upper quadrant. 7. Body wall edema. 8. Aortic atherosclerosis. Aortic Atherosclerosis (ICD10-I70.0). Electronically Signed   By: Greig Pique M.D.   On: 06/15/2024 23:44   DG CHEST PORT 1 VIEW Result Date: 06/15/2024 EXAM: 1 VIEW XRAY OF THE CHEST 06/15/2024 02:47:00 PM COMPARISON: 05/31/2024 CLINICAL HISTORY: Leukocytosis FINDINGS: LUNGS AND PLEURA: Low lung volumes. Left basilar patchy opacities. No pleural effusion. No pneumothorax. HEART AND MEDIASTINUM: No acute abnormality of the cardiac and mediastinal silhouettes. BONES AND SOFT TISSUES: No acute osseous abnormality. LINES AND TUBES: Right PICC with tip at superior cavoatrial junction. IMPRESSION: 1. Left basilar patchy opacities, possibly developing consolidation. 2. Low lung volumes. Electronically signed by: Franky Stanford MD 06/15/2024 06:31 PM EDT RP Workstation: [id number]        Scheduled Meds:  Chlorhexidine  Gluconate Cloth  6 each Topical Daily    enoxaparin  (LOVENOX ) injection  40 mg Subcutaneous Q24H   feeding supplement  1 Container Oral TID BM   feeding supplement  237 mL Oral BID BM   insulin  aspart  0-9 Units Subcutaneous Q6H   LORazepam   0.5 mg Intravenous Once   methocarbamol  (ROBAXIN ) injection  500 mg Intravenous Q8H   rivastigmine   9.5 mg Transdermal Daily   sodium chloride  flush  10-40 mL Intracatheter Q12H   sodium chloride  flush  5 mL Intracatheter Q8H   Continuous Infusions:  lactated ringers  Stopped (06/16/24 1544)   meropenem  (MERREM ) IV 1 g (06/17/24 0513)   TPN ADULT (ION) 80 mL/hr at 06/16/24 1838     LOS: 15 days    Time spent: 35 minutes    Elazar Argabright A  Nuri Branca, MD [facility]   If 7PM-7AM, please contact night-coverage [email or web address]  06/17/2024, 8:10 AM

## 2024-06-17 NOTE — Progress Notes (Signed)
 Progress Note  8 Days Post-Op  Subjective: No family at bedside this morning.  She tells me she is doing quite well.  She denies any abdominal pain.  She reports some vague soreness at the incision with palpation.  Denies any nausea or vomiting.  Has had multiple bowel movements.  Objective: Vital signs in last 24 hours: Temp:  [98.2 F (36.8 C)-98.9 F (37.2 C)] 98.9 F (37.2 C) (09/03 1952) Pulse Rate:  [91-103] 101 (09/03 1952) Resp:  [16-20] 20 (09/03 1952) BP: (103-131)/(73-87) 104/78 (09/03 1952) SpO2:  [95 %-100 %] 100 % (09/03 1735) Weight:  [71.1 kg] 71.1 kg (09/04 0500) Last BM Date : 06/16/24  Intake/Output from previous day: 09/03 0701 - 09/04 0700 In: 2547 [I.V.:2347; IV Piggyback:200] Out: 600 [Stool:600] Intake/Output this shift: No intake/output data recorded.  PE: General: NAD Lungs: Respiratory effort nonlabored Abd: soft, nontender throughout most of her abdomen.  Mild soreness at the incision.  No distention.   Lab Results:  Recent Labs    06/16/24 0500 06/17/24 0311  WBC 20.2* 16.6*  HGB 8.3* 8.0*  HCT 25.9* 24.3*  PLT 359 388   BMET Recent Labs    06/16/24 0500 06/17/24 0311  NA 139 140  K 3.8 3.9  CL 109 110  CO2 21* 23  GLUCOSE 114* 117*  BUN 31* 23  CREATININE 0.34* 0.36*  CALCIUM  8.2* 8.2*   PT/INR Recent Labs    06/16/24 1112  LABPROT 18.4*  INR 1.4*   CMP     Component Value Date/Time   NA 140 06/17/2024 0311   NA 144 03/02/2024 1554   K 3.9 06/17/2024 0311   CL 110 06/17/2024 0311   CO2 23 06/17/2024 0311   GLUCOSE 117 (H) 06/17/2024 0311   BUN 23 06/17/2024 0311   BUN 20 03/02/2024 1554   CREATININE 0.36 (L) 06/17/2024 0311   CALCIUM  8.2 (L) 06/17/2024 0311   PROT 4.9 (L) 06/17/2024 0311   PROT 6.1 03/02/2024 1554   ALBUMIN  2.5 (L) 06/17/2024 0311   ALBUMIN  3.9 03/02/2024 1554   AST 23 06/17/2024 0311   ALT 20 06/17/2024 0311   ALKPHOS 101 06/17/2024 0311   BILITOT 0.4 06/17/2024 0311   BILITOT 0.2  03/02/2024 1554   GFRNONAA >60 06/17/2024 0311   GFRAA >60 01/22/2020 0226   Lipase     Component Value Date/Time   LIPASE 27 05/31/2024 2054       Studies/Results: CT ABDOMEN PELVIS W CONTRAST Result Date: 06/15/2024 CLINICAL DATA:  Complicated diverticulitis suspected. History of right lower lobe adenocarcinoma. EXAM: CT ABDOMEN AND PELVIS WITH CONTRAST TECHNIQUE: Multidetector CT imaging of the abdomen and pelvis was performed using the standard protocol following bolus administration of intravenous contrast. RADIATION DOSE REDUCTION: This exam was performed according to the departmental dose-optimization program which includes automated exposure control, adjustment of the mA and/or kV according to patient size and/or use of iterative reconstruction technique. CONTRAST:  OMNIPAQUE  IOHEXOL  300 MG/ML  SOLN COMPARISON:  CT abdomen and pelvis 06/08/2024. FINDINGS: Lower chest: Pleural effusions have resolved. There is minimal scarring or atelectasis in the left lung base. Hepatobiliary: There some fluid and stranding surrounding the gallbladder. There is no biliary ductal dilatation. The liver appear stable. There is a subcentimeter cyst or hemangioma in the peripheral right lobe. No new liver lesions are seen. Pancreas: Unremarkable. No pancreatic ductal dilatation or surrounding inflammatory changes. Spleen: Normal in size without focal abnormality. Adrenals/Urinary Tract: Small left renal cysts appear unchanged. Otherwise,  the kidneys and adrenal glands are within normal limits. There is diffuse bladder wall thickening. There is a small amount of air in the bladder. Stomach/Bowel: Patient is status post interval abdominal surgery. There is a small amount of free air in the upper abdomen likely related to recent surgery. Stomach is decompressed. There are dilated small bowel loops with air-fluid levels in the upper abdomen measuring up to 3.6 cm. Again seen are clustered small bowel loops in the  pelvis with wall thickening and mesenteric edema similar to prior study. There is a small amount of air within the mesentery at this level which is new from prior. The colon is nondilated. The appendix is not seen. Vascular/Lymphatic: Aortic atherosclerosis. No enlarged abdominal or pelvic lymph nodes. Reproductive: Uterus is unremarkable. Ovaries are not well delineated on this study. Other: New enhancing fluid collection seen in the right paracolic gutter measuring 5.8 x 2.4 by 6.0 cm. New multiloculated enhancing fluid collection seen in the central pelvis distal above the bladder measuring 3.5 x 4.8 x 3.0 cm. There is a small amount of simple free fluid in the right upper quadrant and left upper quadrant. Midline abdominal wall wound is present. There is diffuse body wall edema. There is no focal hernia. Musculoskeletal: The bones are osteopenic. There is dextroconvex curvature of the lumbar spine with multilevel degenerative change. IMPRESSION: 1. Interval abdominal surgery. There is a small amount of free air in the upper abdomen likely related to recent surgery. 2. New enhancing fluid collections in the right paracolic gutter and central pelvis worrisome for abscesses. 3. Persistent clustered small bowel loops in the pelvis with wall thickening and mesenteric edema. There is a small amount of air within the mesentery at this level which is new from prior. 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 4. Dilated small bowel loops in the upper abdomen worrisome for small bowel obstruction. 5. Diffuse bladder wall thickening with small amount of air in the bladder. Findings may be related to recent instrumentation or infection. 6. Small amount of simple free fluid in the right upper quadrant and left upper quadrant. 7. Body wall edema. 8. Aortic atherosclerosis. Aortic Atherosclerosis (ICD10-I70.0). Electronically Signed   By: Greig Pique M.D.   On: 06/15/2024 23:44   DG CHEST PORT 1 VIEW Result Date:  06/15/2024 EXAM: 1 VIEW XRAY OF THE CHEST 06/15/2024 02:47:00 PM COMPARISON: 05/31/2024 CLINICAL HISTORY: Leukocytosis FINDINGS: LUNGS AND PLEURA: Low lung volumes. Left basilar patchy opacities. No pleural effusion. No pneumothorax. HEART AND MEDIASTINUM: No acute abnormality of the cardiac and mediastinal silhouettes. BONES AND SOFT TISSUES: No acute osseous abnormality. LINES AND TUBES: Right PICC with tip at superior cavoatrial junction. IMPRESSION: 1. Left basilar patchy opacities, possibly developing consolidation. 2. Low lung volumes. Electronically signed by: Franky Stanford MD 06/15/2024 06:31 PM EDT RP Workstation: [id number]    Anti-infectives: Anti-infectives (From admission, onward)    Start     Dose/Rate Route Frequency Ordered Stop   06/17/24 0900  vancomycin  (VANCOCIN ) IVPB 1000 mg/200 mL premix  Status:  Discontinued        1,000 mg 200 mL/hr over 60 Minutes Intravenous Every 24 hours 06/16/24 0839 06/16/24 1103   06/16/24 1600  metroNIDAZOLE  (FLAGYL ) IVPB 500 mg  Status:  Discontinued        500 mg 100 mL/hr over 60 Minutes Intravenous Every 12 hours 06/16/24 0840 06/16/24 1104   06/16/24 1500  ceFEPIme  (MAXIPIME ) 2 g in sodium chloride  0.9 % 100 mL IVPB  Status:  Discontinued  2 g 200 mL/hr over 30 Minutes Intravenous Every 12 hours 06/16/24 0840 06/16/24 1103   06/16/24 1400  meropenem  (MERREM ) 1 g in sodium chloride  0.9 % 100 mL IVPB        1 g 200 mL/hr over 30 Minutes Intravenous Every 8 hours 06/16/24 1105     06/16/24 0815  vancomycin  (VANCOREADY) IVPB 1500 mg/300 mL        1,500 mg 150 mL/hr over 120 Minutes Intravenous  Once 06/16/24 0723 06/16/24 1608   06/10/24 0300  ceFEPIme  (MAXIPIME ) 2 g in sodium chloride  0.9 % 100 mL IVPB  Status:  Discontinued        2 g 200 mL/hr over 30 Minutes Intravenous Every 12 hours 06/09/24 1732 06/16/24 0840   06/09/24 2100  metroNIDAZOLE  (FLAGYL ) IVPB 500 mg  Status:  Discontinued        500 mg 100 mL/hr over 60 Minutes  Intravenous Every 12 hours 06/09/24 1725 06/16/24 0840   06/09/24 1730  ceFEPIme  (MAXIPIME ) 1 g in sodium chloride  0.9 % 100 mL IVPB  Status:  Discontinued        1 g 200 mL/hr over 30 Minutes Intravenous Every 12 hours 06/09/24 1725 06/09/24 1731   06/09/24 1500  ceFEPIme  (MAXIPIME ) 2 g in sodium chloride  0.9 % 100 mL IVPB        2 g 200 mL/hr over 30 Minutes Intravenous  Once 06/09/24 1454 06/09/24 1743   06/03/24 1130  vancomycin  (VANCOCIN ) IVPB 1000 mg/200 mL premix  Status:  Discontinued        1,000 mg 200 mL/hr over 60 Minutes Intravenous Every 24 hours 06/02/24 1149 06/03/24 0949   06/03/24 0000  ceFEPIme  (MAXIPIME ) 2 g in sodium chloride  0.9 % 100 mL IVPB        2 g 200 mL/hr over 30 Minutes Intravenous Every 12 hours 06/02/24 1149 06/08/24 1553   06/02/24 2200  metroNIDAZOLE  (FLAGYL ) IVPB 500 mg  Status:  Discontinued        500 mg 100 mL/hr over 60 Minutes Intravenous Every 12 hours 06/02/24 1113 06/09/24 1730   06/02/24 1100  ceFEPIme  (MAXIPIME ) 2 g in sodium chloride  0.9 % 100 mL IVPB        2 g 200 mL/hr over 30 Minutes Intravenous  Once 06/02/24 1045 06/02/24 1449   06/02/24 1100  vancomycin  (VANCOCIN ) IVPB 1000 mg/200 mL premix        1,000 mg 200 mL/hr over 60 Minutes Intravenous  Once 06/02/24 1045 06/02/24 1233   06/02/24 1100  metroNIDAZOLE  (FLAGYL ) IVPB 500 mg        500 mg 100 mL/hr over 60 Minutes Intravenous  Once 06/02/24 1045 06/02/24 1818        Assessment/Plan POD 8, s/p 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 Curvin 8/27 - WBC continues to trend down-16; afebrile - IR drained collection 9/3; cxs pending. - PT/OT eval for mobilization - pulm toilet - multi-modal pain control - BID WD dressing change to midline wound  FEN: Okay for regular diet from our perspective VTE: Lovenox  ID: cefepime /flagyl  for bacteremia, Vanc given 9/3 for possible PNA per medicine  - per TRH -  Early onset Alzheimer's dementia COPD with emphysema  Hx of  HTN HLD PAD Bacteremia likely secondary to above.   LOS: 15 days   Lonni CHRISTELLA Pizza, MD  [facility] 06/17/2024, 8:59 AM Please see Amion for pager number during day hours 7:00am-4:30pm

## 2024-06-17 NOTE — Progress Notes (Addendum)
 PHARMACY - TOTAL PARENTERAL NUTRITION CONSULT NOTE   Indication: Prolonged ileus  Patient Measurements: Height: 5' 2 (157.5 cm) Weight: 71.1 kg (156 lb 12 oz) IBW/kg (Calculated) : 50.1 TPN AdjBW (KG): 61.3 Body mass index is 28.67 kg/m.   Assessment: Presented to ED with N/V/D and abdominal pain x 3d on 05/31/24. CT negative. Possibly from diverticulitis and placed on Cefepime /Flagyl . Concern for SBO, NGT placed. Continues to have persistent pain and distention. No significant intake since 8/21. Pharmacy consulted to start TPN on 8/26.  S/p ex lap with small bowel resection.  Glucose / Insulin : No h/o DM -CBGs within goal, range 65-156 -No SSI given in last 24 hrs Electrolytes: WNL, including coCa (9.2) Renal: Scr and BUN WNL Hepatic: LFTs/Tbili/AlkPhos WNL; alb 2.5 (low) 9/1 TG 103 Intake / Output; MIVF:  -Back to NPO 9/3 -UOP likely not fully documented; 2 occurrences recorded 9/3 -Stool occurrence x2 on 9/3 - LR @30ml /hr, per MD  GI Imaging: 8/19 CT: Scattered colonic diverticulosis. No active diverticulitis.   8/22, 8/23, 8/25, 8/26 Abd Xray: all SBO  8/26 CT: severely thickened and inflamed central pelvic loops of small bowel concerning for ischemia. Concern for mesenteric volvulus or closed loop obstruction.  9/2 CT: 1. Interval abdominal surgery. There is a small amount of free air in the upper abdomen likely related to recent surgery. 2. New enhancing fluid collections in the right paracolic gutter and central pelvis worrisome for abscesses. 3. Persistent clustered small bowel loops in the pelvis with wall thickening and mesenteric edema. There is a small amount of air within the mesentery at this level which is new from prior. 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 4. Dilated small bowel loops in the upper abdomen worrisome for small bowel obstruction. 5. Diffuse bladder wall thickening with small amount of air in the bladder. Findings may be related to recent  instrumentation or infection. 6. Small amount of simple free fluid in the right upper quadrant and left upper quadrant. 7. Body wall edema. 8. Aortic atherosclerosis.  GI Surgeries / Procedures:  8/27 Ex lap with small bowel resection 9/3: IR drainage of abd/pelvic fluid collection  Central access: PICC TPN start date: 8/26  Nutritional Goals: Goal TPN rate is 80 mL/hr (provides 86 g of protein and  1759 kcals per day)  RD Assessment: Kcal 1650-1850 Protein 75-90 g Fluid 1.8 L/day  Current Nutrition:  Previously advanced to soft diet Back to NPO 9/3 per surgery note on 9/4, pt feeling better, no abd pain or N/V. Surgery okay with regular diet from their perspective. After discussion with MD and surgery, will continue TPN at full rate today and tomorrow as patient has tolerated very little PO recently.  Plan:  -Continue TPN at goal 80 mL/hr -Electrolytes in TPN:  Na 50 mEq/L K 40 mEq/L Ca 5 mEq/L Mg 5 mEq/L Phos 15 mmol/L Cl:Ac 1:2 -Add standard MVI and trace elements to TPN -Continue Sensitive q6h SSI and adjust as needed  -Monitor TPN labs on Mon/Thurs, and PRN -F/u toleration of diet advancement and possible decrease in TPN rate tomorrow    Lacinda Moats, PharmD Clinical Pharmacist  9/4/202510:02 AM

## 2024-06-17 NOTE — Plan of Care (Signed)
  Problem: Clinical Measurements: Goal: Diagnostic test results will improve Outcome: Progressing Goal: Signs and symptoms of infection will decrease Outcome: Progressing   Problem: Respiratory: Goal: Ability to maintain adequate ventilation will improve Outcome: Progressing   

## 2024-06-17 NOTE — Progress Notes (Cosign Needed Addendum)
 Referring Physician(s): White,C  Supervising Physician: Philip Cornet  Patient Status:  [facility] - In-pt  Chief Complaint: Abdominal pain, postop abdominal/pelvic fluid collections; s/p RLQ drain placement 9/3    Subjective: Pt without new c/o; son in room; has some tenderness in RLQ   Allergies: Donepezil  and Penicillins  Medications: Prior to Admission medications   Medication Sig Start Date End Date Taking? Authorizing Provider  albuterol  (VENTOLIN  HFA) 108 (90 Base) MCG/ACT inhaler Inhale 2 puffs into the lungs every 6 (six) hours as needed for wheezing or shortness of breath. 08/15/22  Yes Icard, Adine CROME, DO  atorvastatin  (LIPITOR) 20 MG tablet Take 20 mg by mouth daily.   Yes [provider]  b complex vitamins capsule Take 1 capsule by mouth daily.   Yes [provider]  famotidine  (PEPCID ) 20 MG tablet Take 1 tablet (20 mg total) by mouth daily as needed for heartburn or indigestion. 12/05/23  Yes [name], MD  Multiple Vitamin (MULTIVITAMIN) tablet Take 1 tablet by mouth daily.   Yes [provider]  nortriptyline  (PAMELOR ) 25 MG capsule Take 1 capsule (25 mg total) by mouth at bedtime. 05/07/24  Yes [name], MD  traZODone  (DESYREL ) 50 MG tablet Take 50 mg by mouth at bedtime as needed for sleep. 05/20/24  Yes [provider]  rivastigmine  (EXELON ) 9.5 mg/24hr PLACE 1 PATCH (9.5 MG TOTAL) ONTO THE SKIN DAILY. 06/08/24   [name], MD     Vital Signs: BP 104/78 (BP Location: Left Arm)   Pulse (!) 101   Temp 98.9 F (37.2 C)   Resp 20   Ht 5' 2 (1.575 m)   Wt 156 lb 12 oz (71.1 kg)   SpO2 100%   BMI 28.67 kg/m   Physical Exam: awake, answers simple questions; RLQ drain intact, insertion site ok, mildly tender, small amount of serosanguineous fluid in JP bulb; drain flushed but with no significant return.  Imaging: CT GUIDED PERITONEAL/RETROPERITONEAL FLUID DRAIN BY [facility] CATH Result Date:  06/17/2024 INDICATION: Right lower abscess. EXAM: CT-guided abscess drainage placement TECHNIQUE: Multidetector CT imaging of the abdomen was performed following the standard protocol without IV contrast. RADIATION DOSE REDUCTION: This exam was performed according to the departmental dose-optimization program which includes automated exposure control, adjustment of the mA and/or kV according to patient size and/or use of iterative reconstruction technique. MEDICATIONS: The patient is currently admitted to the hospital and receiving intravenous antibiotics. The antibiotics were administered within an appropriate time frame prior to the initiation of the procedure. ANESTHESIA/SEDATION: Moderate (conscious) sedation was employed during this procedure. A total of Versed  2 mg and Fentanyl  1 mcg was administered intravenously by the radiology nurse. Total intra-service moderate Sedation Time: 12 minutes. The patient's level of consciousness and vital signs were monitored continuously by radiology nursing throughout the procedure under my direct supervision. COMPLICATIONS: None immediate. PROCEDURE: Informed written consent was obtained from the patient after a thorough discussion of the procedural risks, benefits and alternatives. All questions were addressed. Maximal Sterile Barrier Technique was utilized including caps, mask, sterile gowns, sterile gloves, sterile drape, hand hygiene and skin antiseptic. A timeout was performed prior to the initiation of the procedure. In a supine position the was scanned and images were. Radiopaque markers were placed in the right lower quadrant and further axial imaging was performed. Measurements were obtained. The patient's skin was then marked, prepped, and draped in the usual sterile fashion. Local anesthesia was achieved with 1% lidocaine . A 7 cm [place]  then advanced through a small incision directed towards the abscess. Repeat imaging was performed and the needle was redirected.  Further imaging was then performed demonstrating the needle to be within the abscess cavity. The needle was removed leaving the sheath behind in the short Amplatz wire was then coiled within the abscess. Repeat imaging demonstrates the guidewire in satisfactory position. The access site was dilated 10 French fascial dilator. Ten French pigtail catheter was then over the guidewire and coiled within the abscess. Retention suture and sterile dressing applied. The catheter was connected to a JP bulb. A small sample obtained sent to pathology for culture sensitivity. IMPRESSION: Satisfactory right lower quadrant 10 French abscess drainage catheter placed. Fluid was obtained and sent to laboratory for culture and sensitivity Electronically Signed   By: Cordella Banner   On: 06/17/2024 13:16   CT ABDOMEN PELVIS W CONTRAST Result Date: 06/15/2024 CLINICAL DATA:  Complicated diverticulitis suspected. History of right lower lobe adenocarcinoma. EXAM: CT ABDOMEN AND PELVIS WITH CONTRAST TECHNIQUE: Multidetector CT imaging of the abdomen and pelvis was performed using the standard protocol following bolus administration of intravenous contrast. RADIATION DOSE REDUCTION: This exam was performed according to the departmental dose-optimization program which includes automated exposure control, adjustment of the mA and/or kV according to patient size and/or use of iterative reconstruction technique. CONTRAST:  OMNIPAQUE  IOHEXOL  300 MG/ML  SOLN COMPARISON:  CT abdomen and pelvis 06/08/2024. FINDINGS: Lower chest: Pleural effusions have resolved. There is minimal scarring or atelectasis in the left lung base. Hepatobiliary: There some fluid and stranding surrounding the gallbladder. There is no biliary ductal dilatation. The liver appear stable. There is a subcentimeter cyst or hemangioma in the peripheral right lobe. No new liver lesions are seen. Pancreas: Unremarkable. No pancreatic ductal dilatation or surrounding  inflammatory changes. Spleen: Normal in size without focal abnormality. Adrenals/Urinary Tract: Small left renal cysts appear unchanged. Otherwise, the kidneys and adrenal glands are within normal limits. There is diffuse bladder wall thickening. There is a small amount of air in the bladder. Stomach/Bowel: Patient is status post interval abdominal surgery. There is a small amount of free air in the upper abdomen likely related to recent surgery. Stomach is decompressed. There are dilated small bowel loops with air-fluid levels in the upper abdomen measuring up to 3.6 cm. Again seen are clustered small bowel loops in the pelvis with wall thickening and mesenteric edema similar to prior study. There is a small amount of air within the mesentery at this level which is new from prior. The colon is nondilated. The appendix is not seen. Vascular/Lymphatic: Aortic atherosclerosis. No enlarged abdominal or pelvic lymph nodes. Reproductive: Uterus is unremarkable. Ovaries are not well delineated on this study. Other: New enhancing fluid collection seen in the right paracolic gutter measuring 5.8 x 2.4 by 6.0 cm. New multiloculated enhancing fluid collection seen in the central pelvis distal above the bladder measuring 3.5 x 4.8 x 3.0 cm. There is a small amount of simple free fluid in the right upper quadrant and left upper quadrant. Midline abdominal wall wound is present. There is diffuse body wall edema. There is no focal hernia. Musculoskeletal: The bones are osteopenic. There is dextroconvex curvature of the lumbar spine with multilevel degenerative change. IMPRESSION: 1. Interval abdominal surgery. There is a small amount of free air in the upper abdomen likely related to recent surgery. 2. New enhancing fluid collections in the right paracolic gutter and central pelvis worrisome for abscesses. 3. Persistent clustered small bowel loops  in the pelvis with wall thickening and mesenteric edema. There is a small amount of  air within the mesentery at this level which is new from prior. 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 4. Dilated small bowel loops in the upper abdomen worrisome for small bowel obstruction. 5. Diffuse bladder wall thickening with small amount of air in the bladder. Findings may be related to recent instrumentation or infection. 6. Small amount of simple free fluid in the right upper quadrant and left upper quadrant. 7. Body wall edema. 8. Aortic atherosclerosis. Aortic Atherosclerosis (ICD10-I70.0). Electronically Signed   By: Greig Pique M.D.   On: 06/15/2024 23:44   DG CHEST PORT 1 VIEW Result Date: 06/15/2024 EXAM: 1 VIEW XRAY OF THE CHEST 06/15/2024 02:47:00 PM COMPARISON: 05/31/2024 CLINICAL HISTORY: Leukocytosis FINDINGS: LUNGS AND PLEURA: Low lung volumes. Left basilar patchy opacities. No pleural effusion. No pneumothorax. HEART AND MEDIASTINUM: No acute abnormality of the cardiac and mediastinal silhouettes. BONES AND SOFT TISSUES: No acute osseous abnormality. LINES AND TUBES: Right PICC with tip at superior cavoatrial junction. IMPRESSION: 1. Left basilar patchy opacities, possibly developing consolidation. 2. Low lung volumes. Electronically signed by: Franky Stanford MD 06/15/2024 06:31 PM EDT RP Workstation: [id number]    Labs:  CBC: Recent Labs    06/14/24 0600 06/15/24 1206 06/16/24 0500 06/17/24 0311  WBC 11.4* 22.0* 20.2* 16.6*  HGB 11.0* 9.6* 8.3* 8.0*  HCT 35.0* 29.4* 25.9* 24.3*  PLT 416* 392 359 388    COAGS: Recent Labs    06/16/24 1112  INR 1.4*    BMP: Recent Labs    06/14/24 0600 06/15/24 1206 06/16/24 0500 06/17/24 0311  NA 140 139 139 140  K 4.4 4.4 3.8 3.9  CL 105 107 109 110  CO2 25 23 21* 23  GLUCOSE 132* 130* 114* 117*  BUN 24* 36* 31* 23  CALCIUM  8.7* 8.5* 8.2* 8.2*  CREATININE 0.38* 0.48 0.34* 0.36*  GFRNONAA >60 >60 >60 >60    LIVER FUNCTION TESTS: Recent Labs    06/09/24 0208 06/10/24 0332 06/14/24 0600 06/17/24 0311   BILITOT 0.3 0.3 0.3 0.4  AST 24 22 33 23  ALT 17 15 20 20   ALKPHOS 74 59 73 101  PROT 5.0* 5.0* 5.3* 4.9*  ALBUMIN  2.9* 3.3* 3.1* 2.5*    Assessment and Plan: Pt s/p ex lap with SBR for ischemic bowel 8/27; now with post op abd/pelvic fluid collections, s/p RLQ drain 9/3 (10 fr to JP); afebrile, WBC 16.6(20.2), hgb 8(8.3), creat 0.36, urine cx- enterococcus, drain fl cx neg to date; cont current tx; obtain f/u imaging if output remains minimal over next 3-4 days or if WBC sig increases   Electronically Signed: D. Franky Rakers, PA-C 06/17/2024, 4:18 PM   I spent a total of 15 Minutes at the the patient's bedside AND on the patient's hospital floor or unit, greater than 50% of which was counseling/coordinating care for right lower abdominal     Patient ID: Lindsay Mason, female   DOB: 11-08-1950, 73 y.o.   MRN: [id number]

## 2024-06-17 NOTE — Plan of Care (Signed)
  Problem: Fluid Volume: Goal: Hemodynamic stability will improve Outcome: Progressing   Problem: Respiratory: Goal: Ability to maintain adequate ventilation will improve Outcome: Progressing   

## 2024-06-18 DIAGNOSIS — R6510 Systemic inflammatory response syndrome (SIRS) of non-infectious origin without acute organ dysfunction: Secondary | ICD-10-CM | POA: Diagnosis not present

## 2024-06-18 DIAGNOSIS — J449 Chronic obstructive pulmonary disease, unspecified: Secondary | ICD-10-CM | POA: Diagnosis not present

## 2024-06-18 DIAGNOSIS — E785 Hyperlipidemia, unspecified: Secondary | ICD-10-CM | POA: Diagnosis not present

## 2024-06-18 DIAGNOSIS — I739 Peripheral vascular disease, unspecified: Secondary | ICD-10-CM | POA: Diagnosis not present

## 2024-06-18 LAB — GLUCOSE, CAPILLARY
Glucose-Capillary: 102 mg/dL — ABNORMAL HIGH (ref 70–99)
Glucose-Capillary: 112 mg/dL — ABNORMAL HIGH (ref 70–99)
Glucose-Capillary: 106 mg/dL — ABNORMAL HIGH (ref 70–99)
Glucose-Capillary: 115 mg/dL — ABNORMAL HIGH (ref 70–99)
Glucose-Capillary: 99 mg/dL (ref 70–99)

## 2024-06-18 LAB — BASIC METABOLIC PANEL WITH GFR
Anion gap: 9 (ref 5–15)
BUN: 20 mg/dL (ref 8–23)
CO2: 23 mmol/L (ref 22–32)
Calcium: 8.1 mg/dL — ABNORMAL LOW (ref 8.9–10.3)
Chloride: 108 mmol/L (ref 98–111)
Creatinine, Ser: 0.33 mg/dL — ABNORMAL LOW (ref 0.44–1.00)
GFR, Estimated: >60 mL/min (ref >60)
Glucose, Bld: 104 mg/dL — ABNORMAL HIGH (ref 70–99)
Potassium: 3.7 mmol/L (ref 3.5–5.1)
Sodium: 140 mmol/L (ref 135–145)

## 2024-06-18 LAB — CBC
HCT: 24.4 % — ABNORMAL LOW (ref 36.0–46.0)
Hemoglobin: 7.9 g/dL — ABNORMAL LOW (ref 12.0–15.0)
MCH: 28.8 pg (ref 26.0–34.0)
MCHC: 32.4 g/dL (ref 30.0–36.0)
MCV: 89.1 fL (ref 80.0–100.0)
Platelets: 442 K/uL — ABNORMAL HIGH (ref 150–400)
RBC: 2.74 MIL/uL — ABNORMAL LOW (ref 3.87–5.11)
RDW: 14.7 % (ref 11.5–15.5)
WBC: 11.7 K/uL — ABNORMAL HIGH (ref 4.0–10.5)
nRBC: 0 % (ref 0.0–0.2)

## 2024-06-18 LAB — URINE CULTURE: Culture: 20000 — AB

## 2024-06-18 MED ORDER — TRAVASOL 10 % IV SOLN
INTRAVENOUS | Status: AC
  Filled 2024-06-18: qty 864

## 2024-06-18 NOTE — Progress Notes (Signed)
 Occupational Therapy Treatment Patient Details Name: TEARA DUERKSEN MRN: [id number] DOB: [birth year] Today's Date: 06/18/2024   History of present illness Pt is 73 yo female presented on 06/02/24  with + blood culture with staph species, lethargy, and worsening mental status. Pt admitted with SIRS, abdominal pain, N/V likely secondary to acute diverticulitis, and SBO vs ileus.  Pt with NG tube and surgery following. s/p exploratory lap with SB resection 2* ischemic bowel 06/09/24.   Pt also with metabolic encephalopathy on background of Alzheimer's dementia.  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 Pt found to be incontinent of bowel/bladder and required MAX-TOTAL A +2-3 for rolling in bed and pericare. Pt anxious, restless, resistive of mobility at bed level due to fear of falling and is significantly limited in task performance by deficits in cognition and sustained attention. Pt requires reassurance, therapeutic listening and distraction with excessive time for all mobility efforts. MAX A +2 for bed mobility (attempted to perform logroll due to abdominal sx, pt unable to follow commands), +2 HHA with max multimodal cues for SPT from bed > BSC > recliner. MAX A to doff/don gown bed level and seated on BSC, extended time spent during session to de-tangle matts in hair. Pt will require significant physical assist and 24/7 supervision at next LOC, discharge recommendation remains appropriate. OT will continue to follow.       If plan is discharge home, recommend the following:  Two people to help with walking and/or transfers;Two people to help with bathing/dressing/bathroom;Assistance with feeding;Direct  supervision/assist for medications management;Assistance with cooking/housework;Direct supervision/assist for financial management;Assist for transportation;Help with stairs or ramp for entrance;Supervision due to cognitive status   Equipment Recommendations  Other (comment)       Precautions / Restrictions Precautions Precautions: Fall Recall of Precautions/Restrictions: Impaired Precaution/Restrictions Comments: abdominal sx, dementia Restrictions Weight Bearing Restrictions Per Provider Order: No       Mobility Bed Mobility Overal bed mobility: Needs Assistance Bed Mobility: Supine to Sit, Rolling Rolling: Used rails Sidelying to sit: +2 for physical assistance, +2 for safety/equipment, HOB elevated, Used rails, Max assist       General bed mobility comments: increased time, reassurance and therepeutic listening. pt very difficult to redirect, anxious, fear of falling with rolling in bed, requires +3 at times for reassurance. attempted logrolling with pt unable to return demo due to decreased cognition    Transfers Overall transfer level: Needs assistance Equipment used: 2 person hand held assist Transfers: Sit to/from Stand, Bed to chair/wheelchair/BSC Sit to Stand: Min assist, +2 safety/equipment Stand pivot transfers: Min assist, +2 safety/equipment         General transfer comment: performed multiple STS transfers during session, pt mainly limited by cognition/functional task attention vs physical ability. pt requires max multimodal cuing for hand placement, often self-directing movements and spontaneously returning to seated, resistive to mobility efforts. performed bed > BSC > recliner with +1 HHA, +1 for cues, seems to do better with face to face transfer     Balance Overall balance assessment: Needs assistance Sitting-balance support: Feet supported Sitting balance-Leahy Scale: Fair Sitting balance - Comments: posterior lean Postural control: Posterior  lean Standing balance support: Bilateral upper extremity supported, During functional activity Standing balance-Leahy Scale: Poor  Standing balance comment: B UE/HHA and min A for balance                           ADL either performed or assessed with clinical judgement   ADL Overall ADL's : Needs assistance/impaired     Grooming: Sitting;Brushing hair Grooming Details (indicate cue type and reason): NT in room providing hair care to detangle matts; spent extended time while pt seated on BSC to untangle/comb hair                 Toilet Transfer: Stand-pivot;BSC/3in1;Cueing for sequencing;Cueing for safety;Minimal assistance Toilet Transfer Details (indicate cue type and reason): pt resistive; requires +2 HHA with max multimodal cuing, increased time, overall very difficult transfer attempt due to pt's inablity to follow commands/sustain attention to task. Toileting- Clothing Manipulation and Hygiene: Total assistance;+2 for physical assistance;+2 for safety/equipment;Bed level;Sit to/from stand Toileting - Clothing Manipulation Details (indicate cue type and reason): pt found incontinent of bowel and bladder, requiring total A for pericare bed level, pt voices need to urinate but unable to follow directions to perform transfer to [facility] in time, urinates on floor/bed requiring additional pericare and gown change     Functional mobility during ADLs: +2 for physical assistance;+2 for safety/equipment;Minimal assistance General ADL Comments: pt significantly limited by fear of falling, cognition, restless and poor attention. does not follow 1step commands     Communication Communication Communication: Impaired Factors Affecting Communication: Difficulty expressing self   Cognition Arousal: Alert Behavior During Therapy: Restless Cognition: History of cognitive impairments             OT - Cognition Comments: low frusteration tolerance, poor attention to task  performance, limited command following, impulsive, decreased insight, decreased safety awareness, significant falls risk, constant redirection as pt pulls at lines/leads/gown/dressing                 Following commands: Impaired Following commands impaired: Follows one step commands inconsistently, Follows one step commands with increased time      Cueing   Cueing Techniques: Verbal cues, Gestural cues, Tactile cues, Visual cues        General Comments RN notified of drainage noted during abdominal pads. RUQ drain placed.    Pertinent Vitals/ Pain       Pain Assessment Pain Assessment: Faces Faces Pain Scale: Hurts little more Pain Location: abdomen with movement Pain Descriptors / Indicators: Guarding, Grimacing, Tender, Sore Pain Intervention(s): Monitored during session, Limited activity within patient's tolerance   Frequency  Min 2X/week        Progress Toward Goals  OT Goals(current goals can now be found in the care plan section)  Progress towards OT goals: Progressing toward goals  Acute Rehab OT Goals OT Goal Formulation: With family Time For Goal Achievement: 06/29/24 Potential to Achieve Goals: Fair ADL Goals Pt Will Perform Upper Body Dressing: with min assist;sitting Pt Will Perform Lower Body Dressing: with mod assist;sitting/lateral leans Pt Will Transfer to Toilet: with min assist;bedside commode;stand pivot transfer Pt Will Perform Toileting - Clothing Manipulation and hygiene: with min assist;sitting/lateral leans  Plan      Co-evaluation    PT/OT/SLP Co-Evaluation/Treatment: Yes Reason for Co-Treatment: Complexity of the patient's impairments (multi-system involvement);Necessary to address cognition/behavior during functional activity;For patient/therapist safety PT goals addressed during session: Mobility/safety with mobility;Balance        AM-PAC OT 6 Clicks Daily Activity     Outcome Measure   Help from another  person eating  meals?: A Lot Help from another person taking care of personal grooming?: A Lot Help from another person toileting, which includes using toliet, bedpan, or urinal?: Total Help from another person bathing (including washing, rinsing, drying)?: Total Help from another person to put on and taking off regular upper body clothing?: A Lot Help from another person to put on and taking off regular lower body clothing?: Total 6 Click Score: 9    End of Session    OT Visit Diagnosis: Unsteadiness on feet (R26.81);Other abnormalities of gait and mobility (R26.89);Muscle weakness (generalized) (M62.81);Feeding difficulties (R63.3);Other symptoms and signs involving cognitive function;Cognitive communication deficit (R41.841);Pain   Activity Tolerance Patient tolerated treatment well   Patient Left in chair;with call bell/phone within reach;with chair alarm set;with nursing/sitter in room   Nurse Communication Mobility status;Other (comment) (abdominal dressing with drainage, BM)        Time: 8741-8655 OT Time Calculation (min): 46 min  Charges: OT General Charges $OT Visit: 1 Visit OT Treatments $Self Care/Home Management : 23-37 mins  Amere Iott L. Rylend Pietrzak, OTR/L  06/18/24, 4:01 PM

## 2024-06-18 NOTE — Progress Notes (Signed)
 Physical Therapy Treatment Patient Details Name: Lindsay Mason MRN: [id number] DOB: May 23, 1951 Today's Date: 06/18/2024   History of Present Illness Pt is 73 yo female presented on 06/02/24  with + blood culture with staph species, lethargy, and worsening mental status. Pt admitted with SIRS, abdominal pain, N/V likely secondary to acute diverticulitis, and SBO vs ileus.  Pt with NG tube and surgery following. s/p exploratory lap with SB resection 2* ischemic bowel 06/09/24.   Pt also with metabolic encephalopathy on background of Alzheimer's dementia.  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  Pt currently with functional limitations due to the deficits listed below (see PT Problem List). Pt in bed when therapist arrived. Nurse tech present and assisting pt with hair care. Once covers removed pt noted to be soiled, pt required extensive assist for hygiene tasks, max A to roll side to side with pt resistive due to fear of falling, pt requires increased time for all motor processing and planing, max A x 2 for supine to sit with attempts to have pt perform log roll due to abdominal surgery and pain with pt unable to observe, min A x 2 for safety for SPT transfers with B HHA and cues from bed to [facility] and BSC to recliner, total A for repositioning in recliner. Pt left seated in recliner and all needs in place with nurse tech present. Pt will benefit from acute skilled PT to increase their independence and safety with mobility to allow discharge.      If plan is discharge home, recommend the following: Two people to help with walking and/or transfers;A lot of help with bathing/dressing/bathroom;Assistance with cooking/housework;Assist for transportation;Help  with stairs or ramp for entrance;Direct supervision/assist for medications management;Direct supervision/assist for financial management;Supervision due to cognitive status   Can travel by private vehicle     No  Equipment Recommendations  None recommended by PT    Recommendations for Other Services       Precautions / Restrictions Precautions Precautions: Fall Recall of Precautions/Restrictions: Impaired Precaution/Restrictions Comments: abdominal sx, dementia Restrictions Weight Bearing Restrictions Per Provider Order: No     Mobility  Bed Mobility Overal bed mobility: Needs Assistance Bed Mobility: Supine to Sit, Rolling Rolling: Used rails Sidelying to sit: +2 for physical assistance, +2 for safety/equipment, HOB elevated, Used rails, Max assist       General bed mobility comments: pt required increased time and multimodal cues for all functional mobility tasks, encouragement for participation, pt fearful of falling and resisitive to rolling required during therapy session to assist with hygiene, strong cues to progress to EOB and place B LE on floor with use of bed pad    Transfers Overall transfer level: Needs assistance Equipment used: 2 person hand held assist Transfers: Sit to/from Stand, Bed to chair/wheelchair/BSC Sit to Stand: Min assist, +2 safety/equipment Stand pivot transfers: Min assist, +2 safety/equipment         General transfer comment: pt required extensive cues for safety and technique, pt appeared to be more apt to perform transfer tasks with one person providing cues with B UE support/HHA and pivoting with therapist from bed to [facility] and BSC to recliner, pt requried total A to reposition in recliner    Ambulation/Gait  Stairs             Wheelchair Mobility     Tilt Bed    Modified Rankin (Stroke Patients Only)       Balance Overall balance assessment: Needs assistance Sitting-balance support: Feet  supported Sitting balance-Leahy Scale: Fair Sitting balance - Comments: posterior lean Postural control: Posterior lean Standing balance support: Bilateral upper extremity supported, During functional activity Standing balance-Leahy Scale: Poor Standing balance comment: B UE/HHA and min A for balance                            Communication Communication Communication: Impaired Factors Affecting Communication: Difficulty expressing self  Cognition Arousal: Alert Behavior During Therapy: Restless   PT - Cognitive impairments: History of cognitive impairments   Orientation impairments: Place, Time, Situation                     Following commands: Impaired Following commands impaired: Follows one step commands inconsistently, Follows one step commands with increased time    Cueing Cueing Techniques: Verbal cues, Gestural cues, Tactile cues, Visual cues  Exercises      General Comments General comments (skin integrity, edema, etc.): R UQ drain placed 9/3      Pertinent Vitals/Pain Pain Assessment Pain Assessment: Faces Faces Pain Scale: Hurts little more Pain Location: abdomen with movement Pain Descriptors / Indicators: Guarding, Grimacing, Tender, Sore Pain Intervention(s): Limited activity within patient's tolerance, Monitored during session, Repositioned    Home Living                          Prior Function            PT Goals (current goals can now be found in the care plan section) Acute Rehab PT Goals Patient Stated Goal: to go for long walks again PT Goal Formulation: With patient/family Time For Goal Achievement: 06/26/24 Potential to Achieve Goals: Fair Progress towards PT goals: Not progressing toward goals - comment (due to cognition and pain response)    Frequency    Min 2X/week      PT Plan      Co-evaluation PT/OT/SLP Co-Evaluation/Treatment: Yes Reason for Co-Treatment: Complexity of the patient's  impairments (multi-system involvement);Necessary to address cognition/behavior during functional activity;For patient/therapist safety PT goals addressed during session: Mobility/safety with mobility;Balance        AM-PAC PT 6 Clicks Mobility   Outcome Measure  Help needed turning from your back to your side while in a flat bed without using bedrails?: A Lot Help needed moving from lying on your back to sitting on the side of a flat bed without using bedrails?: A Lot Help needed moving to and from a bed to a chair (including a wheelchair)?: A Lot Help needed standing up from a chair using your arms (e.g., wheelchair or bedside chair)?: A Lot Help needed to walk in hospital room?: A Lot Help needed climbing 3-5 steps with a railing? : Total 6 Click Score: 11    End of Session Equipment Utilized During Treatment: Gait belt Activity Tolerance: Patient tolerated treatment well Patient left: in chair;with chair alarm set;with call bell/phone within reach;with nursing/sitter in room Nurse Communication: Mobility status;Other (comment) (abdominal bandage wet appearance) PT Visit Diagnosis: Difficulty in walking, not elsewhere classified (R26.2)     Time: 8741-8656 PT Time Calculation (min) (ACUTE ONLY): 45 min  Charges:    $Therapeutic Activity: 8-22 mins  PT General Charges $$ ACUTE PT VISIT: 1 Visit                     [place], PT Acute Rehab    [place] VEAR Drone 06/18/2024, 3:23 PM

## 2024-06-18 NOTE — Progress Notes (Signed)
 Progress Note  9 Days Post-Op  Subjective: No family at bedside this morning.  She tells me she is doing quite well.  She denies any abdominal pain.  She reports some vague soreness at the incision with palpation.  Denies any nausea or vomiting.  Has had multiple bowel movements.  Objective: Vital signs in last 24 hours: Temp:  [98.5 F (36.9 C)-98.7 F (37.1 C)] 98.7 F (37.1 C) (09/05 0548) Pulse Rate:  [93-98] 98 (09/05 0548) Resp:  [17-19] 17 (09/05 0548) BP: (112-144)/(68-91) 142/89 (09/05 0548) SpO2:  [98 %-100 %] 98 % (09/05 0548) Weight:  [71.7 kg] 71.7 kg (09/05 0500) Last BM Date : 06/16/24  Intake/Output from previous day: 09/04 0701 - 09/05 0700 In: 3173.4 [I.V.:2863.4; IV Piggyback:300] Out: 420 [Urine:400; Drains:20] Intake/Output this shift: No intake/output data recorded.  PE: General: NAD Lungs: Respiratory effort nonlabored Abd: soft, nontender throughout most of her abdomen.  Mild soreness at the incision.  No distention.   Lab Results:  Recent Labs    06/17/24 0311 06/18/24 0051  WBC 16.6* 11.7*  HGB 8.0* 7.9*  HCT 24.3* 24.4*  PLT 388 442*   BMET Recent Labs    06/17/24 0311 06/18/24 0051  NA 140 140  K 3.9 3.7  CL 110 108  CO2 23 23  GLUCOSE 117* 104*  BUN 23 20  CREATININE 0.36* 0.33*  CALCIUM  8.2* 8.1*   PT/INR Recent Labs    06/16/24 1112  LABPROT 18.4*  INR 1.4*   CMP     Component Value Date/Time   NA 140 06/18/2024 0051   NA 144 03/02/2024 1554   K 3.7 06/18/2024 0051   CL 108 06/18/2024 0051   CO2 23 06/18/2024 0051   GLUCOSE 104 (H) 06/18/2024 0051   BUN 20 06/18/2024 0051   BUN 20 03/02/2024 1554   CREATININE 0.33 (L) 06/18/2024 0051   CALCIUM  8.1 (L) 06/18/2024 0051   PROT 4.9 (L) 06/17/2024 0311   PROT 6.1 03/02/2024 1554   ALBUMIN  2.5 (L) 06/17/2024 0311   ALBUMIN  3.9 03/02/2024 1554   AST 23 06/17/2024 0311   ALT 20 06/17/2024 0311   ALKPHOS 101 06/17/2024 0311   BILITOT 0.4 06/17/2024 0311    BILITOT 0.2 03/02/2024 1554   GFRNONAA >60 06/18/2024 0051   GFRAA >60 01/22/2020 0226   Lipase     Component Value Date/Time   LIPASE 27 05/31/2024 2054       Studies/Results: CT GUIDED PERITONEAL/RETROPERITONEAL FLUID DRAIN BY PERC CATH Result Date: 06/17/2024 INDICATION: Right lower abscess. EXAM: CT-guided abscess drainage placement TECHNIQUE: Multidetector CT imaging of the abdomen was performed following the standard protocol without IV contrast. RADIATION DOSE REDUCTION: This exam was performed according to the departmental dose-optimization program which includes automated exposure control, adjustment of the mA and/or kV according to patient size and/or use of iterative reconstruction technique. MEDICATIONS: The patient is currently admitted to the hospital and receiving intravenous antibiotics. The antibiotics were administered within an appropriate time frame prior to the initiation of the procedure. ANESTHESIA/SEDATION: Moderate (conscious) sedation was employed during this procedure. A total of Versed  2 mg and Fentanyl  1 mcg was administered intravenously by the radiology nurse. Total intra-service moderate Sedation Time: 12 minutes. The patient's level of consciousness and vital signs were monitored continuously by radiology nursing throughout the procedure under my direct supervision. COMPLICATIONS: None immediate. PROCEDURE: Informed written consent was obtained from the patient after a thorough discussion of the procedural risks, benefits and alternatives. All questions  were addressed. Maximal Sterile Barrier Technique was utilized including caps, mask, sterile gowns, sterile gloves, sterile drape, hand hygiene and skin antiseptic. A timeout was performed prior to the initiation of the procedure. In a supine position the was scanned and images were. Radiopaque markers were placed in the right lower quadrant and further axial imaging was performed. Measurements were obtained. The  patient's skin was then marked, prepped, and draped in the usual sterile fashion. Local anesthesia was achieved with 1% lidocaine . A 7 cm Yueh then advanced through a small incision directed towards the abscess. Repeat imaging was performed and the needle was redirected. Further imaging was then performed demonstrating the needle to be within the abscess cavity. The needle was removed leaving the sheath behind in the short Amplatz wire was then coiled within the abscess. Repeat imaging demonstrates the guidewire in satisfactory position. The access site was dilated 10 French fascial dilator. Ten French pigtail catheter was then over the guidewire and coiled within the abscess. Retention suture and sterile dressing applied. The catheter was connected to a JP bulb. A small sample obtained sent to pathology for culture sensitivity. IMPRESSION: Satisfactory right lower quadrant 10 French abscess drainage catheter placed. Fluid was obtained and sent to laboratory for culture and sensitivity Electronically Signed   By: Cordella Banner   On: 06/17/2024 13:16    Anti-infectives: Anti-infectives (From admission, onward)    Start     Dose/Rate Route Frequency Ordered Stop   06/17/24 0900  vancomycin  (VANCOCIN ) IVPB 1000 mg/200 mL premix  Status:  Discontinued        1,000 mg 200 mL/hr over 60 Minutes Intravenous Every 24 hours 06/16/24 0839 06/16/24 1103   06/16/24 1600  metroNIDAZOLE  (FLAGYL ) IVPB 500 mg  Status:  Discontinued        500 mg 100 mL/hr over 60 Minutes Intravenous Every 12 hours 06/16/24 0840 06/16/24 1104   06/16/24 1500  ceFEPIme  (MAXIPIME ) 2 g in sodium chloride  0.9 % 100 mL IVPB  Status:  Discontinued        2 g 200 mL/hr over 30 Minutes Intravenous Every 12 hours 06/16/24 0840 06/16/24 1103   06/16/24 1400  meropenem  (MERREM ) 1 g in sodium chloride  0.9 % 100 mL IVPB        1 g 200 mL/hr over 30 Minutes Intravenous Every 8 hours 06/16/24 1105     06/16/24 0815  vancomycin  (VANCOREADY)  IVPB 1500 mg/300 mL        1,500 mg 150 mL/hr over 120 Minutes Intravenous  Once 06/16/24 0723 06/16/24 1608   06/10/24 0300  ceFEPIme  (MAXIPIME ) 2 g in sodium chloride  0.9 % 100 mL IVPB  Status:  Discontinued        2 g 200 mL/hr over 30 Minutes Intravenous Every 12 hours 06/09/24 1732 06/16/24 0840   06/09/24 2100  metroNIDAZOLE  (FLAGYL ) IVPB 500 mg  Status:  Discontinued        500 mg 100 mL/hr over 60 Minutes Intravenous Every 12 hours 06/09/24 1725 06/16/24 0840   06/09/24 1730  ceFEPIme  (MAXIPIME ) 1 g in sodium chloride  0.9 % 100 mL IVPB  Status:  Discontinued        1 g 200 mL/hr over 30 Minutes Intravenous Every 12 hours 06/09/24 1725 06/09/24 1731   06/09/24 1500  ceFEPIme  (MAXIPIME ) 2 g in sodium chloride  0.9 % 100 mL IVPB        2 g 200 mL/hr over 30 Minutes Intravenous  Once 06/09/24 1454 06/09/24 1743  06/03/24 1130  vancomycin  (VANCOCIN ) IVPB 1000 mg/200 mL premix  Status:  Discontinued        1,000 mg 200 mL/hr over 60 Minutes Intravenous Every 24 hours 06/02/24 1149 06/03/24 0949   06/03/24 0000  ceFEPIme  (MAXIPIME ) 2 g in sodium chloride  0.9 % 100 mL IVPB        2 g 200 mL/hr over 30 Minutes Intravenous Every 12 hours 06/02/24 1149 06/08/24 1553   06/02/24 2200  metroNIDAZOLE  (FLAGYL ) IVPB 500 mg  Status:  Discontinued        500 mg 100 mL/hr over 60 Minutes Intravenous Every 12 hours 06/02/24 1113 06/09/24 1730   06/02/24 1100  ceFEPIme  (MAXIPIME ) 2 g in sodium chloride  0.9 % 100 mL IVPB        2 g 200 mL/hr over 30 Minutes Intravenous  Once 06/02/24 1045 06/02/24 1449   06/02/24 1100  vancomycin  (VANCOCIN ) IVPB 1000 mg/200 mL premix        1,000 mg 200 mL/hr over 60 Minutes Intravenous  Once 06/02/24 1045 06/02/24 1233   06/02/24 1100  metroNIDAZOLE  (FLAGYL ) IVPB 500 mg        500 mg 100 mL/hr over 60 Minutes Intravenous  Once 06/02/24 1045 06/02/24 1818        Assessment/Plan POD 9, s/p 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 Curvin 8/27 - WBC continues to  trend down-11; afebrile - IR drained collection 9/3; cxs pending. 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   LOS: 16 days   Lindsay CHRISTELLA Pizza, MD  [facility] 06/18/2024, 11:40 AM Please see Amion for pager number during day hours 7:00am-4:30pm

## 2024-06-18 NOTE — Progress Notes (Signed)
 PHARMACY - TOTAL PARENTERAL NUTRITION CONSULT NOTE   Indication: Prolonged ileus  Patient Measurements: Height: 5' 2 (157.5 cm) Weight: 71.7 kg (158 lb 1.1 oz) IBW/kg (Calculated) : 50.1 TPN AdjBW (KG): 61.3 Body mass index is 28.91 kg/m.   Assessment: Presented to ED with N/V/D and abdominal pain x 3d on 05/31/24. CT negative. Possibly from diverticulitis and placed on Cefepime /Flagyl . Concern for SBO, NGT placed. Continues to have persistent pain and distention. No significant intake since 8/21. Pharmacy consulted to start TPN on 8/26.  S/p ex lap with small bowel resection.  Glucose / Insulin : No h/o DM -CBGs within goal, range 102-117 -No SSI given in last 24 hrs Electrolytes: WNL, including coCa (9.3) Renal: Scr and BUN WNL Hepatic: LFTs WNL; alb 2.5; TG 103 Intake / Output; MIVF:  -Started on regular diet 9/4; per RN today and tech that had her yesterday - pt is not really eating much -UOP likely not fully documented -LBM 9/3 -LR 30 ml/hr, per MD  GI Imaging: 8/19 CT: Scattered colonic diverticulosis. No active diverticulitis.   8/22, 8/23, 8/25, 8/26 Abd Xray: all SBO  8/26 CT: severely thickened and inflamed central pelvic loops of small bowel concerning for ischemia. Concern for mesenteric volvulus or closed loop obstruction.  9/2 CT: 1. Interval abdominal surgery. There is a small amount of free air in the upper abdomen likely related to recent surgery. 2. New enhancing fluid collections in the right paracolic gutter and central pelvis worrisome for abscesses. 3. Persistent clustered small bowel loops in the pelvis with wall thickening and mesenteric edema. There is a small amount of air within the mesentery at this level which is new from prior. 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 4. Dilated small bowel loops in the upper abdomen worrisome for small bowel obstruction. 5. Diffuse bladder wall thickening with small amount of air in the bladder. Findings may be  related to recent instrumentation or infection. 6. Small amount of simple free fluid in the right upper quadrant and left upper quadrant. 7. Body wall edema. 8. Aortic atherosclerosis.  GI Surgeries / Procedures:  8/27 Ex lap with small bowel resection 9/3: IR drain placement to abd/pelvic fluid collection  Central access: PICC TPN start date: 8/26  Nutritional Goals: Goal TPN rate is 80 mL/hr (provides 86 g of protein and  1759 kcals per day)  RD Assessment: Kcal 1650-1850 Protein 75-90 g Fluid 1.8 L/day  Current Nutrition:  Regular diet - not eating much at this time per RN and tech who was with patient yesterday  Surgery deferring to [facility] for decision regarding TPN, however would not stop until patient taking in most of her meal trays  Plan:  -Continue TPN at goal 80 mL/hr -Electrolytes in TPN:  Na 50 mEq/L K 40 mEq/L Ca 5 mEq/L Mg 5 mEq/L Phos 15 mmol/L Cl:Ac 1:2 -Add standard MVI and trace elements to TPN -Continue Sensitive q6h SSI and adjust as needed  -Monitor TPN labs on Mon/Thurs, and PRN -Continue to follow for ability to maintain greater PO intake   Stefano MARLA Bologna, PharmD, BCPS Clinical Pharmacist 06/18/2024 10:18 AM

## 2024-06-18 NOTE — Progress Notes (Signed)
 Occupational Therapy Treatment Patient Details Name: Lindsay Mason MRN: [id number] DOB: 11/15/1950 Today's Date: 06/18/2024   History of present illness Pt is 73 yo female presented on 06/02/24  with + blood culture with staph species, lethargy, and worsening mental status. Pt admitted with SIRS, abdominal pain, N/V likely secondary to acute diverticulitis, and SBO vs ileus.  Pt with NG tube and surgery following. s/p exploratory lap with SB resection 2* ischemic bowel 06/09/24.   Pt also with metabolic encephalopathy on background of Alzheimer's dementia.  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 Pt continues to require max multimodal cuing and increased time for all motor planning, presents with low frustration tolerance, agitation, inability to comprehend directions which causes increased fear of falling and resistive behaviors. Attempted to perform SPT from recliner back to bed with +2 HHA, MAX A to scoot hips forward in recliner with pt resisting and pushing posteriorly, daughter in law providing cues / redirection. Pt partially stood with a face-to-face STS, but unsafe to attempt to pivot to bed with continued posterior resistance and pt spontaneously returns to seated. Stedy ultimately used for patient/staff safety, MAX A +2 to stand with cues for hand placement, TOTAL A to return to supine. OT will continue to progress as able.       If plan is discharge home, recommend the following:  Two people to help with walking and/or transfers;Two people to help with bathing/dressing/bathroom;Assistance with feeding;Direct supervision/assist for medications management;Assistance with  cooking/housework;Direct supervision/assist for financial management;Assist for transportation;Help with stairs or ramp for entrance;Supervision due to cognitive status   Equipment Recommendations  Other (comment)       Precautions / Restrictions Precautions Precautions: Fall Recall of Precautions/Restrictions: Impaired Precaution/Restrictions Comments: abdominal sx, dementia Restrictions Weight Bearing Restrictions Per Provider Order: No       Mobility Bed Mobility Overal bed mobility: Needs Assistance Bed Mobility: Sit to Supine Rolling: Used rails Sidelying to sit: +2 for physical assistance, +2 for safety/equipment, HOB elevated, Used rails, Max assist   Sit to supine: Total assist, HOB elevated, +2 for physical assistance   General bed mobility comments: pt agitated, not following commands, TOTAL A +2 to reposition back in bed    Transfers Overall transfer level: Needs assistance Equipment used: 2 person hand held assist Transfers: Sit to/from Stand, Bed to chair/wheelchair/BSC Sit to Stand: Max assist, Via lift equipment, +2 physical assistance, +2 safety/equipment Stand pivot transfers: Min assist, +2 safety/equipment         General transfer comment: attempted multiple times to perform SPT recliner > bed. pt resistive, pushing posteriorally, unwilling to extend at trunk/hips/knees to come to full standing, agitation increasing with attempts at face-to-face stand pivot, daughter in law in room attempting to cue patient with pt yelling and continuing to resist. due to safety concerns, stedy ultimately used to safely transfer pt back to bed. pt requires excessive time for hand placement, foot placement, and rise to stand, MAX A +2 from recliner to stand for bilateral device paddles to be lowered. Transfer via Lift Equipment: Stedy   Balance Overall balance assessment: Needs assistance Sitting-balance support: Feet supported Sitting balance-Leahy Scale: Fair Sitting  balance - Comments: posterior lean Postural control: Posterior lean  Standing balance support: Bilateral upper extremity supported, During functional activity Standing balance-Leahy Scale: Zero Standing balance comment: attempted standing with HHA and face-to-face transfer, pt not following cues, anxious, resistive, pushing posteriorally despite cues and reassurance                           ADL either performed or assessed with clinical judgement   ADL Overall ADL's : Needs assistance/impaired     Grooming: Sitting;Brushing hair Grooming Details (indicate cue type and reason): NT in room providing hair care to detangle matts; spent extended time while pt seated on BSC to untangle/comb hair                 Toilet Transfer: Stand-pivot;BSC/3in1;Cueing for sequencing;Cueing for safety;Minimal assistance Toilet Transfer Details (indicate cue type and reason): pt resistive; requires +2 HHA with max multimodal cuing, increased time, overall very difficult transfer attempt due to pt's inablity to follow commands/sustain attention to task. Toileting- Clothing Manipulation and Hygiene: Total assistance;+2 for physical assistance;+2 for safety/equipment;Bed level;Sit to/from stand Toileting - Clothing Manipulation Details (indicate cue type and reason): pt found incontinent of bowel and bladder, requiring total A for pericare bed level, pt voices need to urinate but unable to follow directions to perform transfer to [facility] in time, urinates on floor/bed requiring additional pericare and gown change     Functional mobility during ADLs: +2 for physical assistance;+2 for safety/equipment;Minimal assistance General ADL Comments: pt significantly limited by fear of falling, cognition, restless and poor attention. does not follow 1step commands     Communication Communication Communication: Impaired Factors Affecting Communication: Difficulty expressing self   Cognition Arousal:  Alert Behavior During Therapy: Agitated, Restless, Anxious, Impulsive Cognition: History of cognitive impairments             OT - Cognition Comments: low frusteration tolerance, pt yelling, resistive, agitated, daughter in law in room assisting with redirection / cues                 Following commands: Impaired Following commands impaired: Follows one step commands inconsistently, Follows one step commands with increased time      Cueing   Cueing Techniques: Verbal cues, Gestural cues, Tactile cues, Visual cues        General Comments RN in room to assist with transfer, RN providing pain meds once pt safely back in bed    Pertinent Vitals/ Pain       Pain Assessment Pain Assessment: Faces Faces Pain Scale: Hurts little more Pain Location: abdomen with movement Pain Descriptors / Indicators: Guarding, Grimacing, Tender, Sore Pain Intervention(s): Limited activity within patient's tolerance, Monitored during session, Repositioned (RN to provide pain meds once pt back in bed)   Frequency  Min 2X/week        Progress Toward Goals  OT Goals(current goals can now be found in the care plan section)  Progress towards OT goals: Progressing toward goals  Acute Rehab OT Goals OT Goal Formulation: With family Time For Goal Achievement: 06/29/24 Potential to Achieve Goals: Fair ADL Goals Pt Will Perform Upper Body Dressing: with min assist;sitting Pt Will Perform Lower Body Dressing: with mod assist;sitting/lateral leans Pt Will Transfer to Toilet: with min assist;bedside commode;stand pivot transfer Pt Will Perform Toileting - Clothing Manipulation and hygiene: with min assist;sitting/lateral leans  Plan      Co-evaluation    PT/OT/SLP Co-Evaluation/Treatment: Yes Reason for Co-Treatment: Complexity of the patient's impairments (multi-system involvement);Necessary to address cognition/behavior during  functional activity;For patient/therapist safety PT goals  addressed during session: Mobility/safety with mobility;Balance        AM-PAC OT 6 Clicks Daily Activity     Outcome Measure   Help from another person eating meals?: A Lot Help from another person taking care of personal grooming?: A Lot Help from another person toileting, which includes using toliet, bedpan, or urinal?: Total Help from another person bathing (including washing, rinsing, drying)?: Total Help from another person to put on and taking off regular upper body clothing?: A Lot Help from another person to put on and taking off regular lower body clothing?: Total 6 Click Score: 9    End of Session Equipment Utilized During Treatment: Other (comment) (stedy)  OT Visit Diagnosis: Unsteadiness on feet (R26.81);Other abnormalities of gait and mobility (R26.89);Muscle weakness (generalized) (M62.81);Feeding difficulties (R63.3);Other symptoms and signs involving cognitive function;Cognitive communication deficit (R41.841);Pain Symptoms and signs involving cognitive functions: Other cerebrovascular disease   Activity Tolerance Treatment limited secondary to agitation   Patient Left in bed;with nursing/sitter in room;with family/visitor present   Nurse Communication Mobility status        Time: 1432-1500 OT Time Calculation (min): 28 min  Charges: OT General Charges $OT Visit: 1 Visit OT Treatments $Therapeutic Activity: 23-37 mins  Uri Covey L. Brendi Mccarroll, OTR/L  06/18/24, 4:22 PM

## 2024-06-18 NOTE — Progress Notes (Signed)
 PROGRESS NOTE    Lindsay Mason  FMW:[id number] DOB: 02-May-1951 DOA: 06/02/2024 PCP: [name], MD   Brief Narrative: 73 year old with past medical history significant for COPD, hyperlipidemia, history of right lower lobe adenocarcinoma treated with lobectomy 2022, peripheral artery disease, thoracic and abdominal aortic aneurysmal, dementia with behavioral disturbance presented to the hospital with positive blood culture with staph species.  Patient had lethargy and worsening mental status.  In the ED patient had slightly low blood pressure.  White blood cell was elevated at 13.  CT abdomen and pelvis done on 06/01/2024 with no acute finding but is scattered colonic diverticulosis with a small diverticulitis.  Patient was then considered for admission to the hospital for further evaluation and treatment.  During hospitalization patient continued to have abdominal distention and pain, general surgery was consulted.  Currently being treated for small bowel obstruction/ileus initially conservatively but since she did not had any improvement and had a significant CT finding underwent exploratory laparotomy with a small bowel resection 06/09/2024.  Patient is currently on TPN and awaiting for clinical improvement. Worsening WBC, repeated CT scan 9/02 showed New enhancing fluid collections in the right paracolic gutter and central pelvis worrisome for abscesses. IR consulted for drain placement. Underwent drain placement on 9/03.  06/18/2024: No complaints.  Patient is slowly improving.  Leukocytosis slowly resolving.  Assessment & Plan:   Principal Problem:   SIRS (systemic inflammatory response syndrome) (HCC) Active Problems:   COPD with emphysema (HCC)   Thoracic aortic atherosclerosis (HCC)   History of essential hypertension   Severe early onset Alzheimer dementia (HCC)   PAD (peripheral artery disease) (HCC)   Confusion and disorientation   Positive blood culture   Nausea and  vomiting   Abdominal pain   Hyperlipidemia   Abnormal EKG  Abdominal pain/  Nausea and vomiting: Sepsis likely secondary to acute diverticulitis Small bowel obstruction/ileus -Patient initially had leukocytosis with hypotension and lethargy and clinical suspicion for diverticulitis suggestive of possible sepsis.  Initial CT scan of the abdomen initially was negative for acute findings but had clinical symptoms of diverticulitis.   --Treated initially with  cefepime  and metronidazole  14 days.  Blood cultures negative. -General surgery was consulted due to persistent abdominal distention and pain.  Initial imaging with x-ray of the abdomen showed possible SBO/ileus.  Patient was then started on conservative treatment with NG tube IV fluid with electrolyte replacement but continued to have pain and distention with NG output.  Repeat CT scan done 06/08/2024 showed significant thickening of the pelvic loops of the small bowel concerning for ischemia. - Patient on underwent diagnostic laparoscopy, exploratory laparotomy with small bowel resection on 06/09/2024.  Was on NG tube which has come off at this time.  -On TPN, start regular diet.  -9/02: WBC increased to 20, CT scan ordered. CT scan showed New enhancing fluid collections in the right paracolic gutter and central pelvis worrisome for abscesses.. some finding worrisome for SBO, mesenteric air concern for Ischemic  bowel.  -Lactic acid normal.  -underwent drain placement by IR on 9/03. Follow culture results.  -9/03; Change cefepime  and flagyl  to Meropenem . Would like to avoid Cefepime  due to encephalopathy.  -WBC trending down.  06/18/2024: Continue to follow WBC.  Hypophosphatemia. Replaced.    Hypokalemia. Replaced.    Metabolic Encephalopathy on the background of Alzheimer's dementia.   Likely secondary to infection.  On nortriptyline  trazodone  Exelon  at home..  On mittens and one-to-one sitter. Delirium precaution.  Change Cefepime  to  Meropenem , to avoid Delirium   Positive blood culture with staph species. Contamination suspected.   COPD with emphysema  Continue albuterol  inhaler.   Hyperlipidemia PAD (peripheral artery disease)  Thoracic aortic atherosclerosis -Holding Lipitor   History of essential hypertension On as needed IV hydralazine    Weakness, deconditioning.  Seen by PT OT and recommended skilled nursing facility placement    Dysphagia; not eating much. Son report she is having issues with coordinating swallowing. Speech consults.  06/18/2024: Speech evaluation input is appreciated.  Please see documentation.   Nutrition Problem: Inadequate oral intake Etiology:  (PSBO)    Signs/Symptoms: NPO status    Interventions: TPN  Estimated body mass index is 28.91 kg/m as calculated from the following:   Height as of this encounter: 5' 2 (1.575 m).   Weight as of this encounter: 71.7 kg.   DVT prophylaxis: Lovenox  Code Status: Full code Family Communication: Son at bedside, updated Disposition Plan:  Status is: Inpatient Remains inpatient appropriate because: management o post op     Consultants:  Surgery   Procedures:    Antimicrobials:    Subjective: No history from patient.  Seen alongside patient's husband, son and sister.  Objective: Vitals:   06/17/24 1700 06/17/24 2034 06/18/24 0500 06/18/24 0548  BP: 112/68 (!) 144/91  (!) 142/89  Pulse: 96 93  98  Resp: 18 19  17   Temp: 98.6 F (37 C) 98.5 F (36.9 C)  98.7 F (37.1 C)  TempSrc: Oral   Oral  SpO2: 98% 100%  98%  Weight:   71.7 kg   Height:        Intake/Output Summary (Last 24 hours) at 06/18/2024 1329 Last data filed at 06/17/2024 2241 Gross per 24 hour  Intake 3173.36 ml  Output 420 ml  Net 2753.36 ml   Filed Weights   06/16/24 0500 06/17/24 0500 06/18/24 0500  Weight: 65.8 kg 71.1 kg 71.7 kg    Examination:  General exam: Not in any distress.  Awake and alert.  Respiratory system: Clear to  auscultation.    Cardiovascular system: S1-S2.   Gastrointestinal system: Soft and nontender. Central nervous system: Awake and alert Extremities: No edema   Data Reviewed: I have personally reviewed following labs and imaging studies  CBC: Recent Labs  Lab 06/14/24 0600 06/15/24 1206 06/16/24 0500 06/17/24 0311 06/18/24 0051  WBC 11.4* 22.0* 20.2* 16.6* 11.7*  HGB 11.0* 9.6* 8.3* 8.0* 7.9*  HCT 35.0* 29.4* 25.9* 24.3* 24.4*  MCV 89.5 89.4 90.6 89.7 89.1  PLT 416* 392 359 388 442*   Basic Metabolic Panel: Recent Labs  Lab 06/13/24 0545 06/14/24 0600 06/15/24 1206 06/16/24 0500 06/17/24 0311 06/18/24 0051  NA 141 140 139 139 140 140  K 4.1 4.4 4.4 3.8 3.9 3.7  CL 104 105 107 109 110 108  CO2 28 25 23  21* 23 23  GLUCOSE 110* 132* 130* 114* 117* 104*  BUN 26* 24* 36* 31* 23 20  CREATININE 0.39* 0.38* 0.48 0.34* 0.36* 0.33*  CALCIUM  8.4* 8.7* 8.5* 8.2* 8.2* 8.1*  MG 2.2 2.2  --   --  2.1  --   PHOS 3.2 3.3  --   --  2.6  --    GFR: Estimated Creatinine Clearance: 58 mL/min (A) (by C-G formula based on SCr of 0.33 mg/dL (L)). Liver Function Tests: Recent Labs  Lab 06/14/24 0600 06/17/24 0311  AST 33 23  ALT 20 20  ALKPHOS 73 101  BILITOT 0.3 0.4  PROT 5.3*  4.9*  ALBUMIN  3.1* 2.5*   No results for input(s): LIPASE, AMYLASE in the last 168 hours. No results for input(s): AMMONIA in the last 168 hours. Coagulation Profile: Recent Labs  Lab 06/16/24 1112  INR 1.4*   Cardiac Enzymes: No results for input(s): CKTOTAL, CKMB, CKMBINDEX, TROPONINI in the last 168 hours. BNP (last 3 results) No results for input(s): PROBNP in the last 8760 hours. HbA1C: No results for input(s): HGBA1C in the last 72 hours. CBG: Recent Labs  Lab 06/17/24 1807 06/18/24 0124 06/18/24 0536 06/18/24 0859 06/18/24 1236  GLUCAP 115* 102* 112* 106* 115*   Lipid Profile: No results for input(s): CHOL, HDL, LDLCALC, TRIG, CHOLHDL, LDLDIRECT in the  last 72 hours.  Thyroid  Function Tests: No results for input(s): TSH, T4TOTAL, FREET4, T3FREE, THYROIDAB in the last 72 hours. Anemia Panel: No results for input(s): VITAMINB12, FOLATE, FERRITIN, TIBC, IRON, RETICCTPCT in the last 72 hours.  Sepsis Labs: Recent Labs  Lab 06/16/24 1112  LATICACIDVEN 0.8    Recent Results (from the past 240 hours)  Urine Culture (for pregnant, neutropenic or urologic patients or patients with an indwelling urinary catheter)     Status: Abnormal   Collection Time: 06/15/24  6:18 PM   Specimen: Urine, Clean Catch  Result Value Ref Range Status   Specimen Description   Final    URINE, CLEAN CATCH Performed at [facility], [address] [place], [place] [address]    Special Requests   Final    NONE Performed at [facility], [address], [place], [place] [address]    Culture 20,000 COLONIES/mL ENTEROCOCCUS FAECIUM (A)  Final   Report Status 06/18/2024 FINAL  Final   Organism ID, Bacteria ENTEROCOCCUS FAECIUM (A)  Final      Susceptibility   Enterococcus faecium - MIC*    AMPICILLIN <=2 SENSITIVE Sensitive     NITROFURANTOIN  32 SENSITIVE Sensitive     VANCOMYCIN  <=0.5 SENSITIVE Sensitive     * 20,000 COLONIES/mL ENTEROCOCCUS FAECIUM  MRSA Next Gen by PCR, Nasal     Status: None   Collection Time: 06/16/24  7:43 AM   Specimen: Nasal Mucosa; Nasal Swab  Result Value Ref Range Status   MRSA by PCR Next Gen NOT DETECTED NOT DETECTED Final    Comment: (NOTE) The GeneXpert MRSA Assay (FDA approved for NASAL specimens only), is one component of a comprehensive MRSA colonization surveillance program. It is not intended to diagnose MRSA infection nor to guide or monitor treatment for MRSA infections. Test performance is not FDA approved in patients less than 45 years old. Performed at [facility], [address] [place], [place] [address]   Aerobic/Anaerobic  Culture w Gram Stain (surgical/deep wound)     Status: None (Preliminary result)   Collection Time: 06/16/24  5:14 PM   Specimen: Abscess  Result Value Ref Range Status   Specimen Description   Final    ABSCESS Performed at [facility], [address], [place], [place] [address]    Special Requests   Final    ABDOMEN Performed at [facility], [address], [place], [place] [address]    Gram Stain   Final    ABUNDANT WBC PRESENT, PREDOMINANTLY PMN NO ORGANISMS SEEN    Culture   Final    NO GROWTH 2 DAYS Performed at [facility], [address] [place], [place] [address]    Report Status PENDING  Incomplete  Radiology Studies: CT GUIDED PERITONEAL/RETROPERITONEAL FLUID DRAIN BY [facility] CATH Result Date: 06/17/2024 INDICATION: Right lower abscess. EXAM: CT-guided abscess drainage placement TECHNIQUE: Multidetector CT imaging of the abdomen was performed following the standard protocol without IV contrast. RADIATION DOSE REDUCTION: This exam was performed according to the departmental dose-optimization program which includes automated exposure control, adjustment of the mA and/or kV according to patient size and/or use of iterative reconstruction technique. MEDICATIONS: The patient is currently admitted to the hospital and receiving intravenous antibiotics. The antibiotics were administered within an appropriate time frame prior to the initiation of the procedure. ANESTHESIA/SEDATION: Moderate (conscious) sedation was employed during this procedure. A total of Versed  2 mg and Fentanyl  1 mcg was administered intravenously by the radiology nurse. Total intra-service moderate Sedation Time: 12 minutes. The patient's level of consciousness and vital signs were monitored continuously by radiology nursing throughout the procedure under my direct supervision. COMPLICATIONS: None immediate. PROCEDURE: Informed written consent was obtained from the  patient after a thorough discussion of the procedural risks, benefits and alternatives. All questions were addressed. Maximal Sterile Barrier Technique was utilized including caps, mask, sterile gowns, sterile gloves, sterile drape, hand hygiene and skin antiseptic. A timeout was performed prior to the initiation of the procedure. In a supine position the was scanned and images were. Radiopaque markers were placed in the right lower quadrant and further axial imaging was performed. Measurements were obtained. The patient's skin was then marked, prepped, and draped in the usual sterile fashion. Local anesthesia was achieved with 1% lidocaine . A 7 cm Yueh then advanced through a small incision directed towards the abscess. Repeat imaging was performed and the needle was redirected. Further imaging was then performed demonstrating the needle to be within the abscess cavity. The needle was removed leaving the sheath behind in the short Amplatz wire was then coiled within the abscess. Repeat imaging demonstrates the guidewire in satisfactory position. The access site was dilated 10 French fascial dilator. Ten French pigtail catheter was then over the guidewire and coiled within the abscess. Retention suture and sterile dressing applied. The catheter was connected to a JP bulb. A small sample obtained sent to pathology for culture sensitivity. IMPRESSION: Satisfactory right lower quadrant 10 French abscess drainage catheter placed. Fluid was obtained and sent to laboratory for culture and sensitivity Electronically Signed   By: Cordella Banner   On: 06/17/2024 13:16        Scheduled Meds:  acetaminophen  (TYLENOL ) oral liquid 160 mg/5 mL  500 mg Oral TID   Chlorhexidine  Gluconate Cloth  6 each Topical Daily   enoxaparin  (LOVENOX ) injection  40 mg Subcutaneous Q24H   feeding supplement  1 Container Oral TID BM   feeding supplement  237 mL Oral BID BM   insulin  aspart  0-9 Units Subcutaneous Q6H   LORazepam    0.5 mg Intravenous Once   methocarbamol  (ROBAXIN ) injection  500 mg Intravenous Q8H   rivastigmine   9.5 mg Transdermal Daily   sodium chloride  flush  10-40 mL Intracatheter Q12H   sodium chloride  flush  5 mL Intracatheter Q8H   Continuous Infusions:  lactated ringers  30 mL/hr at 06/17/24 1816   meropenem  (MERREM ) IV 1 g (06/18/24 0524)   TPN ADULT (ION) 80 mL/hr at 06/17/24 1816   TPN ADULT (ION)       LOS: 16 days    Time spent: 35 minutes    Lindsay LILLETTE Chapel, MD [facility]   If 7PM-7AM, please contact night-coverage [email or web address]  06/18/2024, 1:29 PM

## 2024-06-18 NOTE — Plan of Care (Signed)

## 2024-06-18 NOTE — Evaluation (Signed)
 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   by CT - moderate centrilobular and paraseptal   Ex-smoker 08/2014   currently using e cig   Microscopic colitis 06/2015   lymphocytic by colonoscopy - started entocort [name])   Osteoporosis, unspecified    Scoliosis of lumbar spine    Thoracic aortic atherosclerosis (HCC) 02/2016   by CT   Past Surgical History:  Past Surgical History:  Procedure Laterality Date   CATARACT EXTRACTION W/PHACO Right 02/21/2021   Procedure: CATARACT EXTRACTION PHACO AND INTRAOCULAR LENS PLACEMENT (IOC) RIGHT;  Surgeon: Mittie Gaskin, MD;  Location: [facility];  Service: Ophthalmology;  Laterality: Right;  cde 4.75 00:41.1 minutes 11.5%   CATARACT EXTRACTION W/PHACO Left 03/14/2021   Procedure: CATARACT EXTRACTION PHACO AND INTRAOCULAR LENS PLACEMENT (IOC) LEFT 4.07 00:44.6;  Surgeon: Mittie Gaskin, MD;  Location: [facility];  Service: Ophthalmology;  Laterality: Left;   COLONOSCOPY  06/2015   microscopic colitis [name])   DEXA  10/2008   Osteoporosis   DEXA  02/2014   T score osteopenia hip, osteoporosis spine, scoliosis   ELECTROMAGNETIC NAVIGATION BROCHOSCOPY  01/20/2020   Procedure: ELECTROMAGNETIC NAVIGATION BRONCHOSCOPY;  Surgeon: Brenna Adine CROME, DO;  Location: [facility];  Service: Pulmonary;;   ETT  10/2011   WNL, no evidence of ischemia, excellent exercise tolerance   EXPLORATORY LAPAROTOMY     INTERCOSTAL NERVE BLOCK Right 01/20/2020   Procedure: Intercostal Nerve  Block;  Surgeon: Shyrl Linnie KIDD, MD;  Location: [facility];  Service: Thoracic;  Laterality: Right;   LAPAROSCOPY N/A 06/09/2024   Procedure: LAPAROSCOPY, DIAGNOSTIC, EXPLORATORY LAPAROTOMY WITH SMALL BOWEL RESECTION;  Surgeon: Curvin Deward MOULD, MD;  Location: [facility];  Service: General;  Laterality: N/A;  Possible laparotomy   MRI head  07/2011   multiple foci deep and subcortical white matter, chronic ischemic vs demyelinating, no active disease   NODE DISSECTION  01/20/2020   Procedure: Node Dissection;  Surgeon: Shyrl Linnie KIDD, MD;  Location: [facility];  Service: Thoracic;;   SUBMUCOSAL TATTOO INJECTION  01/20/2020   Procedure: SUBSTITIAL TATTOO INJECTION;  Surgeon: Brenna Adine CROME, DO;  Location: [facility];  Service: Pulmonary;;   TONSILLECTOMY AND ADENOIDECTOMY     Vein removal     removed from legs   VIDEO ASSISTED THORACOSCOPY (VATS)/WEDGE RESECTION Right 01/20/2020   XI ROBOTIC ASSISTED THORASCOPY-RIGHT LOWER LOBE WEDGE RESECTION, RIGHT LOWER LOBECTOMYRightGeneral   VIDEO BRONCHOSCOPY  01/20/2020   Procedure: VIDEO BRONCHOSCOPY WITH FLUORO;  Surgeon: Brenna Adine CROME, DO;  Location: [facility];  Service: Pulmonary;;   HPI:  Pt is 73 yo female presented on 06/02/24  with + blood culture with staph species, lethargy, and worsening mental status. Pt admitted with SIRS, abdominal pain, N/V likely secondary to acute diverticulitis, and SBO vs ileus.  Pt with NG tube and surgery following. s/p exploratory lap with SB resection 2* ischemic bowel 06/09/24.   Pt also with metabolic encephalopathy on background of Alzheimer's dementia.  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 Oral prep and transit prompt with complete oral clearance. Pharyngeal swallow initiation appeared timely with laryngeal elevation noted. No overt or subtle s/s of aspiration noted across consistencies. Pt is at an increased risk of aspiration secondary to cognitive impairments related to dementia. Reviewed tips to encourage PO intake with family.   Recommend continue current diet at tolerated and PO meds as tolerated. No acute SLP needs identified at this time. SLP will sign off. Please re-consult if pt exhibits concerns for aspiration with PO intake.   SLP Visit Diagnosis:  (r/o dysphagia; aspiration risk secondary to dementia)    Aspiration Risk  Mild aspiration risk    Diet Recommendation Regular;Thin liquid    Liquid Administration via: Cup;Straw Medication Administration: Whole meds with liquid Supervision: Full supervision/cueing for compensatory strategies Compensations: Minimize environmental distractions;Slow rate;Small sips/bites Postural Changes: Seated upright at 90 degrees    Other  Recommendations Oral Care Recommendations: Oral care BID     Assistance Recommended at Discharge  Full supervision   Functional Status Assessment Patient has not had a recent decline in their functional status  Frequency and Duration  (n/a)          Prognosis Prognosis for improved oropharyngeal function: Fair Barriers to Reach Goals: Cognitive deficits      Swallow Study   General Date of Onset: 06/17/24 HPI: Pt is 73 yo female presented on 06/02/24  with + blood culture with staph species, lethargy, and worsening mental status. Pt admitted with SIRS, abdominal pain, N/V likely secondary to acute diverticulitis, and SBO vs ileus.  Pt with NG tube and surgery following. s/p exploratory lap with SB resection 2* ischemic bowel 06/09/24.   Pt also with metabolic encephalopathy on background of Alzheimer's dementia.  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, peripheral arterial disease, thoracic and abdominal aortic aneurysm status, dementia with behavioral disturbance Type of Study: Bedside Swallow Evaluation Previous Swallow Assessment: none per chart Diet Prior to this Study: Regular;Thin liquids (Level 0) Temperature Spikes Noted: No Respiratory Status: Room air Behavior/Cognition: Alert;Requires cueing;Confused Oral Cavity Assessment: Within Functional Limits Oral Cavity - Dentition: Adequate natural dentition Vision: Impaired for self-feeding Self-Feeding Abilities: Needs assist;Needs set up Patient Positioning: Upright in bed Baseline Vocal Quality: Normal Volitional Cough: Cognitively unable to elicit Volitional Swallow: Able to elicit    Oral/Motor/Sensory Function Overall Oral Motor/Sensory Function: Within functional limits   Ice Chips Ice chips: Not tested   Thin Liquid Thin Liquid: Within functional limits Presentation: Cup;Self Fed    Nectar Thick Nectar Thick Liquid: Not tested   Honey Thick Honey Thick Liquid: Not tested   Puree Puree: Not tested   Solid     Solid: Within functional limits Presentation: Self Fed      Peyton JINNY Rummer 06/18/2024,3:20 PM

## 2024-06-18 NOTE — Plan of Care (Signed)
   Problem: Education: Goal: Knowledge of General Education information will improve Description Including pain rating scale, medication(s)/side effects and non-pharmacologic comfort measures Outcome: Progressing

## 2024-06-19 DIAGNOSIS — I739 Peripheral vascular disease, unspecified: Secondary | ICD-10-CM | POA: Diagnosis not present

## 2024-06-19 DIAGNOSIS — E785 Hyperlipidemia, unspecified: Secondary | ICD-10-CM | POA: Diagnosis not present

## 2024-06-19 DIAGNOSIS — R6510 Systemic inflammatory response syndrome (SIRS) of non-infectious origin without acute organ dysfunction: Secondary | ICD-10-CM | POA: Diagnosis not present

## 2024-06-19 DIAGNOSIS — J449 Chronic obstructive pulmonary disease, unspecified: Secondary | ICD-10-CM | POA: Diagnosis not present

## 2024-06-19 LAB — CBC
HCT: 26 % — ABNORMAL LOW (ref 36.0–46.0)
Hemoglobin: 8.1 g/dL — ABNORMAL LOW (ref 12.0–15.0)
MCH: 27.6 pg (ref 26.0–34.0)
MCHC: 31.2 g/dL (ref 30.0–36.0)
MCV: 88.7 fL (ref 80.0–100.0)
Platelets: 550 K/uL — ABNORMAL HIGH (ref 150–400)
RBC: 2.93 MIL/uL — ABNORMAL LOW (ref 3.87–5.11)
RDW: 14.7 % (ref 11.5–15.5)
WBC: 10.1 K/uL (ref 4.0–10.5)
nRBC: 0 % (ref 0.0–0.2)

## 2024-06-19 LAB — GLUCOSE, CAPILLARY
Glucose-Capillary: 110 mg/dL — ABNORMAL HIGH (ref 70–99)
Glucose-Capillary: 112 mg/dL — ABNORMAL HIGH (ref 70–99)
Glucose-Capillary: 114 mg/dL — ABNORMAL HIGH (ref 70–99)
Glucose-Capillary: 119 mg/dL — ABNORMAL HIGH (ref 70–99)
Glucose-Capillary: 133 mg/dL — ABNORMAL HIGH (ref 70–99)

## 2024-06-19 MED ORDER — TRAVASOL 10 % IV SOLN
INTRAVENOUS | Status: AC
  Filled 2024-06-19: qty 864

## 2024-06-19 NOTE — Plan of Care (Signed)
  Problem: Respiratory: Goal: Ability to maintain adequate ventilation will improve Outcome: Progressing   Problem: Nutrition: Goal: Adequate nutrition will be maintained Outcome: Progressing   Problem: Elimination: Goal: Will not experience complications related to urinary retention Outcome: Progressing   Problem: Pain Managment: Goal: General experience of comfort will improve and/or be controlled Outcome: Progressing

## 2024-06-19 NOTE — Progress Notes (Signed)
 PHARMACY - TOTAL PARENTERAL NUTRITION CONSULT NOTE   Indication: Prolonged ileus  Patient Measurements: Height: 5' 2 (157.5 cm) Weight: 70.6 kg (155 lb 10.3 oz) IBW/kg (Calculated) : 50.1 TPN AdjBW (KG): 61.3 Body mass index is 28.47 kg/m.   Assessment: Presented to ED with N/V/D and abdominal pain x 3d on 05/31/24. CT negative. Possibly from diverticulitis and placed on Cefepime /Flagyl . Concern for SBO, NGT placed. Continues to have persistent pain and distention. No significant intake since 8/21. Pharmacy consulted to start TPN on 8/26. S/p ex lap with small bowel resection.  Glucose / Insulin : No h/o DM -CBGs within goal, range 102-115 -No SSI given in last 24 hrs Electrolytes: WNL on 9/5, including coCa (9.3) Renal: Scr and BUN WNL Hepatic: LFTs WNL; alb 2.5; TG 103 Intake / Output; MIVF:  -Intake, UOP likely not fully documented -LBM charted 9/3 -LR 30 ml/hr, per MD  GI Imaging: 8/19 CT: Scattered colonic diverticulosis. No active diverticulitis.   8/22, 8/23, 8/25, 8/26 Abd Xray: all SBO  8/26 CT: severely thickened and inflamed central pelvic loops of small bowel concerning for ischemia. Concern for mesenteric volvulus or closed loop obstruction.  9/2 CT: 1. Interval abdominal surgery. There is a small amount of free air in the upper abdomen likely related to recent surgery. 2. New enhancing fluid collections in the right paracolic gutter and central pelvis worrisome for abscesses. 3. Persistent clustered small bowel loops in the pelvis with wall thickening and mesenteric edema. There is a small amount of air within the mesentery at this level which is new from prior. 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 4. Dilated small bowel loops in the upper abdomen worrisome for small bowel obstruction. 5. Diffuse bladder wall thickening with small amount of air in the bladder. Findings may be related to recent instrumentation or infection. 6. Small amount of simple free fluid in  the right upper quadrant and left upper quadrant. 7. Body wall edema. 8. Aortic atherosclerosis.  GI Surgeries / Procedures:  8/27 Ex lap with small bowel resection 9/3: IR drain placement to abd/pelvic fluid collection  Central access: PICC TPN start date: 8/26  Nutritional Goals: Goal TPN rate is 80 mL/hr (provides 86 g of protein and  1759 kcals per day)  RD Assessment: 9/6 Kcal 1650-1850 Protein 75-90 g Fluid 1.8 L/day  Current Nutrition:  Boost/Breeze 1 container TID Ensure Plus High Protein BID  9/4 Regular diet - not eating much. Surgery deferring to TRH for decision regarding TPN continuation.   9/6 Discussed enteral nutrition options with RN, MD, RD.  Calorie count ordered.  Add magic cup with meals.    Plan:  -Continue TPN at goal 80 mL/hr  -Electrolytes in TPN:  Na 50 mEq/L K 40 mEq/L Ca 5 mEq/L Mg 5 mEq/L Phos 15 mmol/L Cl:Ac 1:2 -Add standard MVI and trace elements to TPN -Continue Sensitive q6h SSI and adjust as needed  -Monitor TPN labs on Mon/Thurs, and PRN -Continue to follow for ability to maintain greater PO intake   Wanda Hasting PharmD, BCPS [facility] main pharmacy [phone] 06/19/2024 9:42 AM

## 2024-06-19 NOTE — Progress Notes (Signed)
 Nutrition Follow-up  DOCUMENTATION CODES:   Not applicable  INTERVENTION:   Continue:  TPN to meet nutrition needs until pt meeting > 60% of her nutrition needs with PO diet  Encourage PO intake at meals  Initiate 48 hour calorie count   Add magic cup with meals  Continue:  Boost Breeze po TID, each supplement provides 250 kcal and 9 grams of protein Ensure Plus High Protein po BID, each supplement provides 350 kcal and 20 grams of protein.    NUTRITION DIAGNOSIS:   Inadequate oral intake related to lethargy/confusion (dementia, nausea) as evidenced by meal completion < 50%. Ongoing.   GOAL:   Patient will meet greater than or equal to 90% of their needs Met with TPN, diet and ONS.   MONITOR:   Labs (TPN)  REASON FOR ASSESSMENT:   Consult Assessment of nutrition requirement/status, Calorie Count  ASSESSMENT:   73 y.o. 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 Currently being treated for small bowel obstruction/ileus with NG tube and IV fluids.   Per RN pt able to swallow but PO intake at meals remains <50%. Per MD likely dementia preventing PO intake. Pt has both Boost Breeze and Ensure Plus/High Protein ordered. Per MAR review acceptance and intake is variable, mostly it is noted she takes a few sips with medications.   8/20: admitted  8/24: NGT placed for suction 8/26: TPN initiated via PICC 8/27: s/p ex lap, SB resection, TPN to goal 8/30: CLD 8/31: NGT removed 9/1: FLD 9/2: Soft diet  9/4 - diet advanced to Regular  Medications reviewed and include: Boost Breeze TID, Ensure Plus/High Protein BID, SSI every 4 hours TPN @ 80 ml/hr via PICC - provides 1759 kcal and 86 grams  protein   Labs reviewed:  CBG's: 99-119   JP drain: 10 ml   Diet Order:   Diet Order             Diet regular Room service appropriate? Yes; Fluid consistency: Thin  Diet effective now                   EDUCATION NEEDS:   No education needs have been identified at this time  Skin:  Skin Assessment: Skin Integrity Issues: Skin Integrity Issues:: Incisions Incisions: 8/27 abdomen  Last BM:  9/5 small; type 4  Height:   Ht Readings from Last 1 Encounters:  06/02/24 5' 2 (1.575 m)    Weight:   Wt Readings from Last 1 Encounters:  06/19/24 70.6 kg    BMI:  Body mass index is 28.47 kg/m.  Estimated Nutritional Needs:   Kcal:  1650-1850  Protein:  75-90g  Fluid:  1.8L/day  Imagine Nest P., RD, LDN, CNSC See AMiON for contact information

## 2024-06-19 NOTE — Progress Notes (Signed)
 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  Patient had lethargy and worsening mental status.  In the ED patient had slightly low blood pressure.  White blood cell was elevated at 13.  CT abdomen and pelvis done on 06/01/2024 with no acute finding but is scattered colonic diverticulosis with a small diverticulitis.  Patient was then considered for admission to the hospital for further evaluation and treatment.  During hospitalization patient continued to have abdominal distention and pain, general surgery was consulted.  Currently being treated for small bowel obstruction/ileus initially conservatively but since she did not had any improvement and had a significant CT finding underwent exploratory laparotomy with a small bowel resection 06/09/2024.  Patient is currently on TPN and awaiting for clinical improvement. Worsening WBC, repeated CT scan 9/02 showed New enhancing fluid collections in the right paracolic gutter and central pelvis worrisome for abscesses. IR consulted for drain placement. Underwent drain placement on 9/03.  06/18/2024: No complaints.  Patient is slowly improving.  Leukocytosis slowly resolving. 06/19/2024: Patient seen.  Patient is not able to provide any significant history due to dementing illness.  Patient is not in any distress.  Assessment & Plan:   Principal Problem:   SIRS (systemic inflammatory response syndrome) (HCC) Active Problems:   COPD with emphysema (HCC)   Thoracic aortic atherosclerosis (HCC)   History of essential hypertension   Severe early onset  Alzheimer dementia (HCC)   PAD (peripheral artery disease) (HCC)   Confusion and disorientation   Positive blood culture   Nausea and vomiting   Abdominal pain   Hyperlipidemia   Abnormal EKG  Abdominal pain/  Nausea and vomiting: Sepsis likely secondary to acute diverticulitis Small bowel obstruction/ileus -Patient initially had leukocytosis with hypotension and lethargy and clinical suspicion for diverticulitis suggestive of possible sepsis.  Initial CT scan of the abdomen initially was negative for acute findings but had clinical symptoms of diverticulitis.   --Treated initially with  cefepime  and metronidazole  14 days.  Blood cultures negative. -General surgery was consulted due to persistent abdominal distention and pain.  Initial imaging with x-ray of the abdomen showed possible SBO/ileus.  Patient was then started on conservative treatment with NG tube IV fluid with electrolyte replacement but continued to have pain and distention with NG output.  Repeat CT scan done 06/08/2024 showed significant thickening of the pelvic loops of the small bowel concerning for ischemia. - Patient on underwent diagnostic laparoscopy, exploratory laparotomy with small bowel resection on 06/09/2024.  Was on NG tube which has come off at this time.  -On TPN, start regular diet.  -9/02: WBC increased to 20, CT scan ordered. CT scan showed New enhancing fluid collections in the right paracolic gutter and central pelvis worrisome for abscesses.. some finding worrisome for SBO, mesenteric air concern for Ischemic  bowel.  -Lactic acid normal.  -underwent drain placement by IR on 9/03. Follow culture results.  -9/03; Change cefepime  and flagyl  to Meropenem . Would like to avoid Cefepime  due to encephalopathy.  -WBC trending down.  06/19/2024: Continue to follow WBC.  WBC is down to 10.1.  Hypophosphatemia. Replaced.    Hypokalemia. Replaced. Last potassium level was 3.7.  Metabolic Encephalopathy on the  background of Alzheimer's dementia.   Likely secondary to infection.  On nortriptyline  trazodone  Exelon  at home..  On mittens and one-to-one sitter. Delirium precaution.  Change Cefepime  to Meropenem , to avoid Delirium 06/19/2024: Delirium has resolved.  Patient has underlying dementia.  Positive blood culture with staph species. Contamination suspected.   COPD with emphysema  Continue albuterol  inhaler.   Hyperlipidemia PAD (peripheral artery disease)  Thoracic aortic atherosclerosis -Holding Lipitor   History of essential hypertension On as needed IV hydralazine    Weakness, deconditioning.  Seen by PT OT and recommended skilled nursing facility placement    Dysphagia; not eating much. Son report she is having issues with coordinating swallowing. Speech consults.  06/18/2024: Speech evaluation input is appreciated.  Please see documentation.   Nutrition Problem: Inadequate oral intake Etiology: lethargy/confusion (dementia, nausea)    Signs/Symptoms: meal completion < 50%    Interventions: TPN  Estimated body mass index is 28.47 kg/m as calculated from the following:   Height as of this encounter: 5' 2 (1.575 m).   Weight as of this encounter: 70.6 kg.   DVT prophylaxis: Lovenox  Code Status: Full code Family Communication: Son at bedside, updated Disposition Plan:  Status is: Inpatient Remains inpatient appropriate because: management o post op     Consultants:  Surgery  Interventional radiology  Procedures:  Diagnostic laparoscopy, exploratory laparotomy with small bowel resection CT guided drainage of right lower quadrant abdominal abscess (10 French drainage catheter was placed)  Antimicrobials:  IV meropenem  1 g every 8 hourly.  Subjective: No history from patient.    Objective: Vitals:   06/18/24 2036 06/19/24 0500 06/19/24 0508 06/19/24 1146  BP: (!) 150/97  (!) 134/93 (!) 143/93  Pulse: 86  93 96  Resp: 18  18 15   Temp: 99.1 F (37.3 C)   98.8 F (37.1 C) 98.6 F (37 C)  TempSrc: Oral  Oral   SpO2: 100%  97% 100%  Weight:  70.6 kg    Height:        Intake/Output Summary (Last 24 hours) at 06/19/2024 1159 Last data filed at 06/19/2024 0924 Gross per 24 hour  Intake 1498.6 ml  Output 5 ml  Net 1493.6 ml   Filed Weights   06/17/24 0500 06/18/24 0500 06/19/24 0500  Weight: 71.1 kg 71.7 kg 70.6 kg    Examination:  General exam: Not in any distress.  Awake and alert.  Respiratory system: Clear to auscultation.    Cardiovascular system: S1-S2.   Gastrointestinal system: Soft and nontender. Central nervous system: Awake and alert Extremities: No edema   Data Reviewed: I have personally reviewed following labs and imaging studies  CBC: Recent Labs  Lab 06/15/24 1206 06/16/24 0500 06/17/24 0311 06/18/24 0051 06/19/24 0323  WBC 22.0* 20.2* 16.6* 11.7* 10.1  HGB 9.6* 8.3* 8.0* 7.9* 8.1*  HCT 29.4* 25.9* 24.3* 24.4* 26.0*  MCV 89.4 90.6 89.7 89.1 88.7  PLT 392 359 388 442* 550*   Basic Metabolic Panel: Recent Labs  Lab 06/13/24 0545 06/14/24 0600 06/15/24 1206 06/16/24 0500 06/17/24 0311 06/18/24 0051  NA 141 140 139 139 140 140  K 4.1 4.4 4.4 3.8 3.9 3.7  CL 104 105 107 109 110 108  CO2 28 25 23  21* 23 23  GLUCOSE 110* 132* 130* 114* 117* 104*  BUN 26* 24* 36* 31* 23 20  CREATININE 0.39* 0.38* 0.48 0.34* 0.36* 0.33*  CALCIUM  8.4* 8.7* 8.5* 8.2* 8.2* 8.1*  MG 2.2 2.2  --   --  2.1  --   PHOS 3.2 3.3  --   --  2.6  --    GFR: Estimated Creatinine Clearance: 57.6 mL/min (A) (by C-G formula based on SCr of 0.33 mg/dL (L)). Liver Function Tests: Recent Labs  Lab 06/14/24 0600 06/17/24 0311  AST 33 23  ALT 20 20  ALKPHOS 73 101  BILITOT 0.3 0.4  PROT 5.3* 4.9*  ALBUMIN  3.1* 2.5*   No results for input(s): LIPASE, AMYLASE in the last 168 hours. No results for input(s): AMMONIA in the last 168 hours. Coagulation Profile: Recent Labs  Lab 06/16/24 1112  INR 1.4*   Cardiac  Enzymes: No results for input(s): CKTOTAL, CKMB, CKMBINDEX, TROPONINI in the last 168 hours. BNP (last 3 results) No results for input(s): PROBNP in the last 8760 hours. HbA1C: No results for input(s): HGBA1C in the last 72 hours. CBG: Recent Labs  Lab 06/18/24 1236 06/18/24 1748 06/19/24 0034 06/19/24 0512 06/19/24 1142  GLUCAP 115* 99 112* 119* 114*   Lipid Profile: No results for input(s): CHOL, HDL, LDLCALC, TRIG, CHOLHDL, LDLDIRECT in the last 72 hours.  Thyroid  Function Tests: No results for input(s): TSH, T4TOTAL, FREET4, T3FREE, THYROIDAB in the last 72 hours. Anemia Panel: No results for input(s): VITAMINB12, FOLATE, FERRITIN, TIBC, IRON, RETICCTPCT in the last 72 hours.  Sepsis Labs: Recent Labs  Lab 06/16/24 1112  LATICACIDVEN 0.8    Recent Results (from the past 240 hours)  Urine Culture (for pregnant, neutropenic or urologic patients or patients with an indwelling urinary catheter)     Status: Abnormal   Collection Time: 06/15/24  6:18 PM   Specimen: Urine, Clean Catch  Result Value Ref Range Status   Specimen Description   Final    URINE, CLEAN CATCH Performed at [facility], [address], [place], [place] [address]    Special Requests   Final    NONE Performed at [facility], [address] [place], [place] [address]    Culture 20,000 COLONIES/mL ENTEROCOCCUS FAECIUM (A)  Final   Report Status 06/18/2024 FINAL  Final   Organism ID, Bacteria ENTEROCOCCUS FAECIUM (A)  Final      Susceptibility   Enterococcus faecium - MIC*    AMPICILLIN <=2 SENSITIVE Sensitive     NITROFURANTOIN  32 SENSITIVE Sensitive     VANCOMYCIN  <=0.5 SENSITIVE Sensitive     * 20,000 COLONIES/mL ENTEROCOCCUS FAECIUM  MRSA Next Gen by PCR, Nasal     Status: None   Collection Time: 06/16/24  7:43 AM   Specimen: Nasal Mucosa; Nasal Swab  Result Value Ref Range Status   MRSA by PCR Next Gen  NOT DETECTED NOT DETECTED Final    Comment: (NOTE) The GeneXpert MRSA Assay (FDA approved for NASAL specimens only), is one component of a comprehensive MRSA colonization surveillance program. It is not intended to diagnose MRSA infection nor to guide or monitor treatment for MRSA infections. Test performance is not FDA approved in patients less than 28 years old. Performed at [facility], [address] [place], [place] [address]   Aerobic/Anaerobic Culture w Gram Stain (surgical/deep wound)     Status: None (Preliminary result)   Collection Time: 06/16/24  5:14 PM   Specimen: Abscess  Result Value Ref Range Status   Specimen Description   Final    ABSCESS Performed at [facility], [address] [place], [place] [address]    Special Requests   Final    ABDOMEN Performed  at [facility], [address] [place], [place] [address]    Gram Stain   Final    ABUNDANT WBC PRESENT, PREDOMINANTLY PMN NO ORGANISMS SEEN    Culture   Final    NO GROWTH 2 DAYS NO ANAEROBES ISOLATED; CULTURE IN PROGRESS FOR 5 DAYS Performed at [facility], [address], [place], [place] [address]    Report Status PENDING  Incomplete         Radiology Studies: No results found.       Scheduled Meds:  acetaminophen  (TYLENOL ) oral liquid 160 mg/5 mL  500 mg Oral TID   Chlorhexidine  Gluconate Cloth  6 each Topical Daily   enoxaparin  (LOVENOX ) injection  40 mg Subcutaneous Q24H   feeding supplement  1 Container Oral TID BM   feeding supplement  237 mL Oral BID BM   insulin  aspart  0-9 Units Subcutaneous Q6H   LORazepam   0.5 mg Intravenous Once   methocarbamol  (ROBAXIN ) injection  500 mg Intravenous Q8H   rivastigmine   9.5 mg Transdermal Daily   sodium chloride  flush  10-40 mL Intracatheter Q12H   sodium chloride  flush  5 mL Intracatheter Q8H   Continuous Infusions:  lactated ringers  Stopped (06/19/24 0505)   meropenem   (MERREM ) IV Stopped (06/19/24 0537)   TPN ADULT (ION) 80 mL/hr at 06/19/24 0549   TPN ADULT (ION)       LOS: 17 days    Time spent: 35 minutes    Lindsay LILLETTE Chapel, MD [facility]   If 7PM-7AM, please contact night-coverage [email or web address]  06/19/2024, 11:59 AM

## 2024-06-19 NOTE — Progress Notes (Signed)
 10 Days Post-Op   Subjective/Chief Complaint: No complaints. Says she has had some nausea but no family at bedside today to confirm this given her dementia   Objective: Vital signs in last 24 hours: Temp:  [98.8 F (37.1 C)-99.1 F (37.3 C)] 98.8 F (37.1 C) (09/06 0508) Pulse Rate:  [86-93] 93 (09/06 0508) Resp:  [18] 18 (09/06 0508) BP: (134-150)/(93-97) 134/93 (09/06 0508) SpO2:  [97 %-100 %] 97 % (09/06 0508) Weight:  [70.6 kg] 70.6 kg (09/06 0500) Last BM Date : 06/16/24  Intake/Output from previous day: 09/05 0701 - 09/06 0700 In: 1443.6 [I.V.:1048.6; IV Piggyback:385] Out: 10 [Drains:10] Intake/Output this shift: No intake/output data recorded.  General appearance: alert and cooperative Resp: clear to auscultation bilaterally Cardio: regular rate and rhythm GI: soft, mild tenderness  Lab Results:  Recent Labs    06/18/24 0051 06/19/24 0323  WBC 11.7* 10.1  HGB 7.9* 8.1*  HCT 24.4* 26.0*  PLT 442* 550*   BMET Recent Labs    06/17/24 0311 06/18/24 0051  NA 140 140  K 3.9 3.7  CL 110 108  CO2 23 23  GLUCOSE 117* 104*  BUN 23 20  CREATININE 0.36* 0.33*  CALCIUM  8.2* 8.1*   PT/INR Recent Labs    06/16/24 1112  LABPROT 18.4*  INR 1.4*   ABG No results for input(s): PHART, HCO3 in the last 72 hours.  Invalid input(s): PCO2, PO2  Studies/Results: No results found.  Anti-infectives: Anti-infectives (From admission, onward)    Start     Dose/Rate Route Frequency Ordered Stop   06/17/24 0900  vancomycin  (VANCOCIN ) IVPB 1000 mg/200 mL premix  Status:  Discontinued        1,000 mg 200 mL/hr over 60 Minutes Intravenous Every 24 hours 06/16/24 0839 06/16/24 1103   06/16/24 1600  metroNIDAZOLE  (FLAGYL ) IVPB 500 mg  Status:  Discontinued        500 mg 100 mL/hr over 60 Minutes Intravenous Every 12 hours 06/16/24 0840 06/16/24 1104   06/16/24 1500  ceFEPIme  (MAXIPIME ) 2 g in sodium chloride  0.9 % 100 mL IVPB  Status:  Discontinued        2  g 200 mL/hr over 30 Minutes Intravenous Every 12 hours 06/16/24 0840 06/16/24 1103   06/16/24 1400  meropenem  (MERREM ) 1 g in sodium chloride  0.9 % 100 mL IVPB        1 g 200 mL/hr over 30 Minutes Intravenous Every 8 hours 06/16/24 1105     06/16/24 0815  vancomycin  (VANCOREADY) IVPB 1500 mg/300 mL        1,500 mg 150 mL/hr over 120 Minutes Intravenous  Once 06/16/24 0723 06/16/24 1608   06/10/24 0300  ceFEPIme  (MAXIPIME ) 2 g in sodium chloride  0.9 % 100 mL IVPB  Status:  Discontinued        2 g 200 mL/hr over 30 Minutes Intravenous Every 12 hours 06/09/24 1732 06/16/24 0840   06/09/24 2100  metroNIDAZOLE  (FLAGYL ) IVPB 500 mg  Status:  Discontinued        500 mg 100 mL/hr over 60 Minutes Intravenous Every 12 hours 06/09/24 1725 06/16/24 0840   06/09/24 1730  ceFEPIme  (MAXIPIME ) 1 g in sodium chloride  0.9 % 100 mL IVPB  Status:  Discontinued        1 g 200 mL/hr over 30 Minutes Intravenous Every 12 hours 06/09/24 1725 06/09/24 1731   06/09/24 1500  ceFEPIme  (MAXIPIME ) 2 g in sodium chloride  0.9 % 100 mL IVPB  2 g 200 mL/hr over 30 Minutes Intravenous  Once 06/09/24 1454 06/09/24 1743   06/03/24 1130  vancomycin  (VANCOCIN ) IVPB 1000 mg/200 mL premix  Status:  Discontinued        1,000 mg 200 mL/hr over 60 Minutes Intravenous Every 24 hours 06/02/24 1149 06/03/24 0949   06/03/24 0000  ceFEPIme  (MAXIPIME ) 2 g in sodium chloride  0.9 % 100 mL IVPB        2 g 200 mL/hr over 30 Minutes Intravenous Every 12 hours 06/02/24 1149 06/08/24 1553   06/02/24 2200  metroNIDAZOLE  (FLAGYL ) IVPB 500 mg  Status:  Discontinued        500 mg 100 mL/hr over 60 Minutes Intravenous Every 12 hours 06/02/24 1113 06/09/24 1730   06/02/24 1100  ceFEPIme  (MAXIPIME ) 2 g in sodium chloride  0.9 % 100 mL IVPB        2 g 200 mL/hr over 30 Minutes Intravenous  Once 06/02/24 1045 06/02/24 1449   06/02/24 1100  vancomycin  (VANCOCIN ) IVPB 1000 mg/200 mL premix        1,000 mg 200 mL/hr over 60 Minutes Intravenous   Once 06/02/24 1045 06/02/24 1233   06/02/24 1100  metroNIDAZOLE  (FLAGYL ) IVPB 500 mg        500 mg 100 mL/hr over 60 Minutes Intravenous  Once 06/02/24 1045 06/02/24 1818       Assessment/Plan: s/p Procedure(s) with comments: LAPAROSCOPY, DIAGNOSTIC, EXPLORATORY LAPAROTOMY WITH SMALL BOWEL RESECTION (N/A) - Possible laparotomy Advance diet as she can tolerate. Dementia seems to keep her from eating Nurses state she passed her swallow study Continue tpn for nutrition Dressing changes to midline wound, clean POD 10, s/p 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 Curvin 8/27 - WBC continues to trend down-10.1; afebrile - IR drained collection 9/3; cxs pending. - PT/OT eval for mobilization - pulm toilet - multi-modal pain control - BID WD dressing change to midline wound   FEN: Okay for regular diet from our perspective VTE: Lovenox  ID: cefepime /flagyl  for bacteremia, Vanc given 9/3 for possible PNA per medicine   - per TRH -  Early onset Alzheimer's dementia COPD with emphysema  Hx of HTN HLD PAD Bacteremia likely secondary to above.  LOS: 17 days    Deward Curvin III 06/19/2024

## 2024-06-19 NOTE — Progress Notes (Signed)
 Referring Physician(s): White,C  Supervising Physician: Jennefer Rover  Patient Status:  [facility] - In-pt  Chief Complaint: Abdominal pain, postop abdominal/pelvic fluid collections; s/p RLQ drain placement 9/3    Subjective: Patient resting quietly in bed, attempting to eat eggs/bacon; denies fever, nausea, vomiting; has some soreness in right lower quadrant but not worsening   Allergies: Donepezil  and Penicillins  Medications: Prior to Admission medications   Medication Sig Start Date End Date Taking? Authorizing Provider  albuterol  (VENTOLIN  HFA) 108 (90 Base) MCG/ACT inhaler Inhale 2 puffs into the lungs every 6 (six) hours as needed for wheezing or shortness of breath. 08/15/22  Yes Icard, Adine CROME, DO  atorvastatin  (LIPITOR) 20 MG tablet Take 20 mg by mouth daily.   Yes [provider]  b complex vitamins capsule Take 1 capsule by mouth daily.   Yes [provider]  famotidine  (PEPCID ) 20 MG tablet Take 1 tablet (20 mg total) by mouth daily as needed for heartburn or indigestion. 12/05/23  Yes [name], MD  Multiple Vitamin (MULTIVITAMIN) tablet Take 1 tablet by mouth daily.   Yes [provider]  nortriptyline  (PAMELOR ) 25 MG capsule Take 1 capsule (25 mg total) by mouth at bedtime. 05/07/24  Yes [name], MD  traZODone  (DESYREL ) 50 MG tablet Take 50 mg by mouth at bedtime as needed for sleep. 05/20/24  Yes [provider]  rivastigmine  (EXELON ) 9.5 mg/24hr PLACE 1 PATCH (9.5 MG TOTAL) ONTO THE SKIN DAILY. 06/08/24   [name], MD     Vital Signs: BP (!) 134/93 (BP Location: Left Arm)   Pulse 93   Temp 98.8 F (37.1 C) (Oral)   Resp 18   Ht 5' 2 (1.575 m)   Wt 155 lb 10.3 oz (70.6 kg)   SpO2 97%   BMI 28.47 kg/m   Physical Exam: Patient awake, responds to some simple questions okay but noted history of dementia; right lower quadrant drain intact, insertion site okay, mildly tender, small amount of  serosanguineous fluid in JP bulb.  Drain flushed with minimal return  Imaging: CT GUIDED PERITONEAL/RETROPERITONEAL FLUID DRAIN BY PERC CATH Result Date: 06/17/2024 INDICATION: Right lower abscess. EXAM: CT-guided abscess drainage placement TECHNIQUE: Multidetector CT imaging of the abdomen was performed following the standard protocol without IV contrast. RADIATION DOSE REDUCTION: This exam was performed according to the departmental dose-optimization program which includes automated exposure control, adjustment of the mA and/or kV according to patient size and/or use of iterative reconstruction technique. MEDICATIONS: The patient is currently admitted to the hospital and receiving intravenous antibiotics. The antibiotics were administered within an appropriate time frame prior to the initiation of the procedure. ANESTHESIA/SEDATION: Moderate (conscious) sedation was employed during this procedure. A total of Versed  2 mg and Fentanyl  1 mcg was administered intravenously by the radiology nurse. Total intra-service moderate Sedation Time: 12 minutes. The patient's level of consciousness and vital signs were monitored continuously by radiology nursing throughout the procedure under my direct supervision. COMPLICATIONS: None immediate. PROCEDURE: Informed written consent was obtained from the patient after a thorough discussion of the procedural risks, benefits and alternatives. All questions were addressed. Maximal Sterile Barrier Technique was utilized including caps, mask, sterile gowns, sterile gloves, sterile drape, hand hygiene and skin antiseptic. A timeout was performed prior to the initiation of the procedure. In a supine position the was scanned and images were. Radiopaque markers were placed in the right lower quadrant and further axial imaging was performed. Measurements were obtained. The patient's skin  was then marked, prepped, and draped in the usual sterile fashion. Local anesthesia was achieved with  1% lidocaine . A 7 cm Yueh then advanced through a small incision directed towards the abscess. Repeat imaging was performed and the needle was redirected. Further imaging was then performed demonstrating the needle to be within the abscess cavity. The needle was removed leaving the sheath behind in the short Amplatz wire was then coiled within the abscess. Repeat imaging demonstrates the guidewire in satisfactory position. The access site was dilated 10 French fascial dilator. Ten French pigtail catheter was then over the guidewire and coiled within the abscess. Retention suture and sterile dressing applied. The catheter was connected to a JP bulb. A small sample obtained sent to pathology for culture sensitivity. IMPRESSION: Satisfactory right lower quadrant 10 French abscess drainage catheter placed. Fluid was obtained and sent to laboratory for culture and sensitivity Electronically Signed   By: Cordella Banner   On: 06/17/2024 13:16   CT ABDOMEN PELVIS W CONTRAST Result Date: 06/15/2024 CLINICAL DATA:  Complicated diverticulitis suspected. History of right lower lobe adenocarcinoma. EXAM: CT ABDOMEN AND PELVIS WITH CONTRAST TECHNIQUE: Multidetector CT imaging of the abdomen and pelvis was performed using the standard protocol following bolus administration of intravenous contrast. RADIATION DOSE REDUCTION: This exam was performed according to the departmental dose-optimization program which includes automated exposure control, adjustment of the mA and/or kV according to patient size and/or use of iterative reconstruction technique. CONTRAST:  OMNIPAQUE  IOHEXOL  300 MG/ML  SOLN COMPARISON:  CT abdomen and pelvis 06/08/2024. FINDINGS: Lower chest: Pleural effusions have resolved. There is minimal scarring or atelectasis in the left lung base. Hepatobiliary: There some fluid and stranding surrounding the gallbladder. There is no biliary ductal dilatation. The liver appear stable. There is a subcentimeter  cyst or hemangioma in the peripheral right lobe. No new liver lesions are seen. Pancreas: Unremarkable. No pancreatic ductal dilatation or surrounding inflammatory changes. Spleen: Normal in size without focal abnormality. Adrenals/Urinary Tract: Small left renal cysts appear unchanged. Otherwise, the kidneys and adrenal glands are within normal limits. There is diffuse bladder wall thickening. There is a small amount of air in the bladder. Stomach/Bowel: Patient is status post interval abdominal surgery. There is a small amount of free air in the upper abdomen likely related to recent surgery. Stomach is decompressed. There are dilated small bowel loops with air-fluid levels in the upper abdomen measuring up to 3.6 cm. Again seen are clustered small bowel loops in the pelvis with wall thickening and mesenteric edema similar to prior study. There is a small amount of air within the mesentery at this level which is new from prior. The colon is nondilated. The appendix is not seen. Vascular/Lymphatic: Aortic atherosclerosis. No enlarged abdominal or pelvic lymph nodes. Reproductive: Uterus is unremarkable. Ovaries are not well delineated on this study. Other: New enhancing fluid collection seen in the right paracolic gutter measuring 5.8 x 2.4 by 6.0 cm. New multiloculated enhancing fluid collection seen in the central pelvis distal above the bladder measuring 3.5 x 4.8 x 3.0 cm. There is a small amount of simple free fluid in the right upper quadrant and left upper quadrant. Midline abdominal wall wound is present. There is diffuse body wall edema. There is no focal hernia. Musculoskeletal: The bones are osteopenic. There is dextroconvex curvature of the lumbar spine with multilevel degenerative change. IMPRESSION: 1. Interval abdominal surgery. There is a small amount of free air in the upper abdomen likely related to recent surgery.  2. New enhancing fluid collections in the right paracolic gutter and central pelvis  worrisome for abscesses. 3. Persistent clustered small bowel loops in the pelvis with wall thickening and mesenteric edema. There is a small amount of air within the mesentery at this level which is new from prior. 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 4. Dilated small bowel loops in the upper abdomen worrisome for small bowel obstruction. 5. Diffuse bladder wall thickening with small amount of air in the bladder. Findings may be related to recent instrumentation or infection. 6. Small amount of simple free fluid in the right upper quadrant and left upper quadrant. 7. Body wall edema. 8. Aortic atherosclerosis. Aortic Atherosclerosis (ICD10-I70.0). Electronically Signed   By: Greig Pique M.D.   On: 06/15/2024 23:44   DG CHEST PORT 1 VIEW Result Date: 06/15/2024 EXAM: 1 VIEW XRAY OF THE CHEST 06/15/2024 02:47:00 PM COMPARISON: 05/31/2024 CLINICAL HISTORY: Leukocytosis FINDINGS: LUNGS AND PLEURA: Low lung volumes. Left basilar patchy opacities. No pleural effusion. No pneumothorax. HEART AND MEDIASTINUM: No acute abnormality of the cardiac and mediastinal silhouettes. BONES AND SOFT TISSUES: No acute osseous abnormality. LINES AND TUBES: Right PICC with tip at superior cavoatrial junction. IMPRESSION: 1. Left basilar patchy opacities, possibly developing consolidation. 2. Low lung volumes. Electronically signed by: Franky Stanford MD 06/15/2024 06:31 PM EDT RP Workstation: [id number]    Labs:  CBC: Recent Labs    06/16/24 0500 06/17/24 0311 06/18/24 0051 06/19/24 0323  WBC 20.2* 16.6* 11.7* 10.1  HGB 8.3* 8.0* 7.9* 8.1*  HCT 25.9* 24.3* 24.4* 26.0*  PLT 359 388 442* 550*    COAGS: Recent Labs    06/16/24 1112  INR 1.4*    BMP: Recent Labs    06/15/24 1206 06/16/24 0500 06/17/24 0311 06/18/24 0051  NA 139 139 140 140  K 4.4 3.8 3.9 3.7  CL 107 109 110 108  CO2 23 21* 23 23  GLUCOSE 130* 114* 117* 104*  BUN 36* 31* 23 20  CALCIUM  8.5* 8.2* 8.2* 8.1*  CREATININE 0.48 0.34*  0.36* 0.33*  GFRNONAA >60 >60 >60 >60    LIVER FUNCTION TESTS: Recent Labs    06/09/24 0208 06/10/24 0332 06/14/24 0600 06/17/24 0311  BILITOT 0.3 0.3 0.3 0.4  AST 24 22 33 23  ALT 17 15 20 20   ALKPHOS 74 59 73 101  PROT 5.0* 5.0* 5.3* 4.9*  ALBUMIN  2.9* 3.3* 3.1* 2.5*    Assessment and Plan: Pt s/p ex lap with SBR for ischemic bowel 8/27; now with post op abd/pelvic fluid collections, s/p RLQ drain 9/3 (10 fr to JP); afebrile, WBC normal, hemoglobin 8.1 up from 7.9, drain fluid cultures negative to date; if drain output continues to remain low would recommend follow-up CT and/or drain injection next week to assess for possible drain removal   Electronically Signed: D. Franky Rakers, PA-C 06/19/2024, 10:09 AM   I spent a total of 15 Minutes at the the patient's bedside AND on the patient's hospital floor or unit, greater than 50% of which was counseling/coordinating care for right lower abdominal drain    Patient ID: Lindsay Mason, female   DOB: [birth year], 73 y.o.   MRN: [id number]

## 2024-06-20 ENCOUNTER — Inpatient Hospital Stay (HOSPITAL_COMMUNITY)

## 2024-06-20 DIAGNOSIS — I739 Peripheral vascular disease, unspecified: Secondary | ICD-10-CM | POA: Diagnosis not present

## 2024-06-20 DIAGNOSIS — E785 Hyperlipidemia, unspecified: Secondary | ICD-10-CM | POA: Diagnosis not present

## 2024-06-20 DIAGNOSIS — R6510 Systemic inflammatory response syndrome (SIRS) of non-infectious origin without acute organ dysfunction: Secondary | ICD-10-CM | POA: Diagnosis not present

## 2024-06-20 DIAGNOSIS — J449 Chronic obstructive pulmonary disease, unspecified: Secondary | ICD-10-CM | POA: Diagnosis not present

## 2024-06-20 LAB — CBC
HCT: 26.5 % — ABNORMAL LOW (ref 36.0–46.0)
Hemoglobin: 8.3 g/dL — ABNORMAL LOW (ref 12.0–15.0)
MCH: 27.9 pg (ref 26.0–34.0)
MCHC: 31.3 g/dL (ref 30.0–36.0)
MCV: 89.2 fL (ref 80.0–100.0)
Platelets: 589 K/uL — ABNORMAL HIGH (ref 150–400)
RBC: 2.97 MIL/uL — ABNORMAL LOW (ref 3.87–5.11)
RDW: 14.7 % (ref 11.5–15.5)
WBC: 10.3 K/uL (ref 4.0–10.5)
nRBC: 0 % (ref 0.0–0.2)

## 2024-06-20 LAB — GLUCOSE, CAPILLARY
Glucose-Capillary: 114 mg/dL — ABNORMAL HIGH (ref 70–99)
Glucose-Capillary: 118 mg/dL — ABNORMAL HIGH (ref 70–99)
Glucose-Capillary: 107 mg/dL — ABNORMAL HIGH (ref 70–99)

## 2024-06-20 MED ORDER — TRAVASOL 10 % IV SOLN
INTRAVENOUS | Status: AC
  Filled 2024-06-20: qty 864

## 2024-06-20 MED ORDER — ACETAMINOPHEN 160 MG/5ML PO SOLN
ORAL | Status: AC
  Administered 2024-06-20: 500 mg via ORAL
  Filled 2024-06-20: qty 20.3

## 2024-06-20 MED ORDER — IOHEXOL 300 MG/ML  SOLN
100.0000 mL | Freq: Once | INTRAMUSCULAR | Status: AC | PRN
  Administered 2024-06-20: 100 mL via INTRAVENOUS

## 2024-06-20 MED ORDER — ACETAMINOPHEN 325 MG PO TABS
650.0000 mg | ORAL_TABLET | Freq: Four times a day (QID) | ORAL | Status: DC | PRN
  Administered 2024-06-22: 650 mg via ORAL
  Filled 2024-06-20: qty 2

## 2024-06-20 MED ORDER — OXYCODONE HCL 5 MG PO TABS
5.0000 mg | ORAL_TABLET | Freq: Once | ORAL | Status: AC
  Administered 2024-06-20: 5 mg via ORAL
  Filled 2024-06-20: qty 1

## 2024-06-20 MED ORDER — IOHEXOL 300 MG/ML  SOLN: 100.0000 mL | Freq: Once | INTRAMUSCULAR | Status: DC | PRN

## 2024-06-20 NOTE — Plan of Care (Signed)
  Problem: Fluid Volume: Goal: Hemodynamic stability will improve Outcome: Progressing   Problem: Clinical Measurements: Goal: Diagnostic test results will improve Outcome: Progressing Goal: Signs and symptoms of infection will decrease Outcome: Progressing   Problem: Respiratory: Goal: Ability to maintain adequate ventilation will improve Outcome: Progressing   Problem: Education: Goal: Knowledge of General Education information will improve Description: Including pain rating scale, medication(s)/side effects and non-pharmacologic comfort measures Outcome: Progressing   Problem: Clinical Measurements: Goal: Will remain free from infection Outcome: Progressing Goal: Respiratory complications will improve Outcome: Progressing Goal: Cardiovascular complication will be avoided Outcome: Progressing   Problem: Nutrition: Goal: Adequate nutrition will be maintained Outcome: Progressing   Problem: Coping: Goal: Level of anxiety will decrease Outcome: Progressing   Problem: Pain Managment: Goal: General experience of comfort will improve and/or be controlled Outcome: Progressing   Problem: Skin Integrity: Goal: Risk for impaired skin integrity will decrease Outcome: Progressing   Problem: Coping: Goal: Ability to adjust to condition or change in health will improve Outcome: Progressing   Problem: Fluid Volume: Goal: Ability to maintain a balanced intake and output will improve Outcome: Progressing   Problem: Metabolic: Goal: Ability to maintain appropriate glucose levels will improve Outcome: Progressing   Problem: Nutritional: Goal: Maintenance of adequate nutrition will improve Outcome: Progressing   Problem: Skin Integrity: Goal: Risk for impaired skin integrity will decrease Outcome: Progressing   Problem: Tissue Perfusion: Goal: Adequacy of tissue perfusion will improve Outcome: Progressing

## 2024-06-20 NOTE — Plan of Care (Signed)
  Problem: Fluid Volume: Goal: Hemodynamic stability will improve Outcome: Progressing   Problem: Clinical Measurements: Goal: Diagnostic test results will improve Outcome: Progressing   Problem: Respiratory: Goal: Ability to maintain adequate ventilation will improve Outcome: Progressing   Problem: Respiratory: Goal: Ability to maintain adequate ventilation will improve Outcome: Progressing   Problem: Activity: Goal: Risk for activity intolerance will decrease Outcome: Progressing   Problem: Nutrition: Goal: Adequate nutrition will be maintained Outcome: Progressing   Problem: Coping: Goal: Level of anxiety will decrease Outcome: Progressing   Problem: Coping: Goal: Level of anxiety will decrease Outcome: Progressing

## 2024-06-20 NOTE — Progress Notes (Signed)
 PHARMACY - TOTAL PARENTERAL NUTRITION CONSULT NOTE   Indication: Prolonged ileus  Patient Measurements: Height: 5' 2 (157.5 cm) Weight: 73.7 kg (162 lb 7.7 oz) IBW/kg (Calculated) : 50.1 TPN AdjBW (KG): 61.3 Body mass index is 29.72 kg/m.   Assessment: Presented to ED with N/V/D and abdominal pain x 3d on 05/31/24. CT negative. Possibly from diverticulitis and placed on Cefepime /Flagyl . Concern for SBO, NGT placed. Continues to have persistent pain and distention. No significant intake since 8/21. Pharmacy consulted to start TPN on 8/26. S/p ex lap with small bowel resection on 8/27.    Glucose / Insulin : No h/o DM -CBGs within goal, range 110-133 -No SSI given in last 24 hrs Electrolytes: WNL on 9/5, including coCa (9.3) Renal: Scr and BUN WNL Hepatic: LFTs WNL; alb 2.5; TG 103 Intake / Output; MIVF:  -LBM 9/6, UOP 800 mL -LR 30 ml/hr, per MD (stopped 9/6 per RN documentation)  GI Imaging: 8/19 CT: Scattered colonic diverticulosis. No active diverticulitis.   8/22, 8/23, 8/25, 8/26 Abd Xray: all SBO  8/26 CT: severely thickened and inflamed central pelvic loops of small bowel concerning for ischemia. Concern for mesenteric volvulus or closed loop obstruction.  9/2 CT: 1. Interval abdominal surgery. There is a small amount of free air in the upper abdomen likely related to recent surgery. 2. New enhancing fluid collections in the right paracolic gutter and central pelvis worrisome for abscesses. 3. Persistent clustered small bowel loops in the pelvis with wall thickening and mesenteric edema. There is a small amount of air within the mesentery at this level which is new from prior. 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 4. Dilated small bowel loops in the upper abdomen worrisome for small bowel obstruction. 5. Diffuse bladder wall thickening with small amount of air in the bladder. Findings may be related to recent instrumentation or infection. 6. Small amount of simple free fluid  in the right upper quadrant and left upper quadrant. 7. Body wall edema. 8. Aortic atherosclerosis.  GI Surgeries / Procedures:  8/27 Ex lap with small bowel resection 9/3: IR drain placement to abd/pelvic fluid collection  Central access: PICC TPN start date: 8/26  Nutritional Goals: Goal TPN rate is 80 mL/hr (provides 86 g of protein and  1759 kcals per day)  RD Assessment: 9/6 Kcal 1650-1850 Protein 75-90 g Fluid 1.8 L/day  Current Nutrition:  Boost/Breeze 1 container TID Ensure Plus High Protein BID  9/4 Regular diet - not eating much. Surgery deferring to TRH for decision regarding TPN continuation.   9/6 Discussed enteral nutrition options with RN, MD, RD.  Calorie count ordered.  Add magic cup with meals.    Plan:  -Continue TPN at goal 80 mL/hr  -Electrolytes in TPN:  Na 50 mEq/L K 40 mEq/L Ca 5 mEq/L Mg 5 mEq/L Phos 15 mmol/L Cl:Ac 1:2 -Add standard MVI and trace elements to TPN -Continue Sensitive q6h SSI and adjust as needed  -Monitor TPN labs on Mon/Thurs, and PRN -Continue to follow for ability to maintain greater PO intake   Wanda Hasting PharmD, BCPS [facility] main pharmacy [phone] 06/20/2024 8:28 AM

## 2024-06-20 NOTE — Progress Notes (Addendum)
 LUNCH: 10% 1/4 Boost 6 bites green beans 7 bites baked potato 4oz tea

## 2024-06-20 NOTE — Progress Notes (Signed)
 Patient eating bites of food and sips of Boost. She ate 2 french fries and had 2 sips of Boost HP and some water.

## 2024-06-20 NOTE — Progress Notes (Addendum)
 Breakfast: 25% 5 bites oatmeal c banana 4 bites egg 2 bites hash brown Bit of english muffin with butter Sips of water

## 2024-06-20 NOTE — Progress Notes (Signed)
 DINNER: 25% Tea, chicken, broccoli, potatoes, magic cup

## 2024-06-20 NOTE — Progress Notes (Signed)
 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  Patient had lethargy and worsening mental status.  In the ED patient had slightly low blood pressure.  White blood cell was elevated at 13.  CT abdomen and pelvis done on 06/01/2024 with no acute finding but is scattered colonic diverticulosis with a small diverticulitis.  Patient was then considered for admission to the hospital for further evaluation and treatment.  During hospitalization patient continued to have abdominal distention and pain, general surgery was consulted.  Currently being treated for small bowel obstruction/ileus initially conservatively but since she did not had any improvement and had a significant CT finding underwent exploratory laparotomy with a small bowel resection 06/09/2024.  Patient is currently on TPN and awaiting for clinical improvement. Worsening WBC, repeated CT scan 9/02 showed New enhancing fluid collections in the right paracolic gutter and central pelvis worrisome for abscesses. IR consulted for drain placement. Underwent drain placement on 9/03.  06/18/2024: No complaints.  Patient is slowly improving.  Leukocytosis slowly resolving. 06/19/2024: Patient seen.  Patient is not able to provide any significant history due to dementing illness.  Patient is not in any distress. 06/20/2024: No new changes.  Poor p.o. intake.  Patient remains on TPN.  Assessment & Plan:   Principal Problem:   SIRS (systemic inflammatory response syndrome) (HCC) Active Problems:   COPD with emphysema (HCC)   Thoracic aortic atherosclerosis  (HCC)   History of essential hypertension   Severe early onset Alzheimer dementia (HCC)   PAD (peripheral artery disease) (HCC)   Confusion and disorientation   Positive blood culture   Nausea and vomiting   Abdominal pain   Hyperlipidemia   Abnormal EKG  Abdominal pain/  Nausea and vomiting: Sepsis likely secondary to acute diverticulitis Small bowel obstruction/ileus -Patient initially had leukocytosis with hypotension and lethargy and clinical suspicion for diverticulitis suggestive of possible sepsis.  Initial CT scan of the abdomen initially was negative for acute findings but had clinical symptoms of diverticulitis.   --Treated initially with  cefepime  and metronidazole  14 days.  Blood cultures negative. -General surgery was consulted due to persistent abdominal distention and pain.  Initial imaging with x-ray of the abdomen showed possible SBO/ileus.  Patient was then started on conservative treatment with NG tube IV fluid with electrolyte replacement but continued to have pain and distention with NG output.  Repeat CT scan done 06/08/2024 showed significant thickening of the pelvic loops of the small bowel concerning for ischemia. - Patient on underwent diagnostic laparoscopy, exploratory laparotomy with small bowel resection on 06/09/2024.  Was on NG tube which has come off at this time.  -On TPN, start regular diet.  -9/02: WBC increased to 20, CT scan ordered. CT scan showed New enhancing fluid collections in the right paracolic gutter and central pelvis worrisome for abscesses.. some finding worrisome for SBO, mesenteric air concern for Ischemic  bowel.  -Lactic acid normal.  -underwent drain placement by IR on 9/03. Follow culture results.  -9/03; Change cefepime  and flagyl  to Meropenem . Would like to avoid Cefepime  due to encephalopathy.  -WBC trending down.  06/19/2024: Continue to follow WBC.  WBC is down to 10.1. 06/20/2024: No  new changes.  WBC of 10.3.  Hypophosphatemia.  Replaced.    Hypokalemia. Replaced. Last potassium level was 3.7.   Metabolic Encephalopathy on the background of Alzheimer's dementia.   Likely secondary to infection.  On nortriptyline  trazodone  Exelon  at home..  On mittens and one-to-one sitter. Delirium precaution.  Change Cefepime  to Meropenem , to avoid Delirium 06/19/2024: Delirium has resolved.  Patient has underlying dementia.  Positive blood culture with staph species. Contamination suspected.   COPD with emphysema  Continue albuterol  inhaler.   Hyperlipidemia PAD (peripheral artery disease)  Thoracic aortic atherosclerosis -Holding Lipitor   History of essential hypertension On as needed IV hydralazine    Weakness, deconditioning.  Seen by PT OT and recommended skilled nursing facility placement    Dysphagia; not eating much. Son report she is having issues with coordinating swallowing. Speech consults.  06/18/2024: Speech evaluation input is appreciated.  Please see documentation.   Nutrition Problem: Inadequate oral intake Etiology: lethargy/confusion (dementia, nausea)    Signs/Symptoms: meal completion < 50%    Interventions: TPN  Estimated body mass index is 29.72 kg/m as calculated from the following:   Height as of this encounter: 5' 2 (1.575 m).   Weight as of this encounter: 73.7 kg.   DVT prophylaxis: Lovenox  Code Status: Full code Family Communication: Son at bedside, updated Disposition Plan:  Status is: Inpatient Remains inpatient appropriate because: management o post op     Consultants:  Surgery  Interventional radiology  Procedures:  Diagnostic laparoscopy, exploratory laparotomy with small bowel resection CT guided drainage of right lower quadrant abdominal abscess (10 French drainage catheter was placed)  Antimicrobials:  IV meropenem  1 g every 8 hourly.  Subjective: No history from patient.    Objective: Vitals:   06/20/24 0208 06/20/24 0424 06/20/24 0500 06/20/24 1221   BP: (!) 114/54 (!) 138/98  (!) 131/91  Pulse: 79 94  (!) 102  Resp: 20 18  (!) 24  Temp: 98.8 F (37.1 C) 98.7 F (37.1 C)  98.7 F (37.1 C)  TempSrc: Oral Oral  Oral  SpO2: 92%     Weight:   73.7 kg   Height:        Intake/Output Summary (Last 24 hours) at 06/20/2024 1424 Last data filed at 06/20/2024 0900 Gross per 24 hour  Intake 115 ml  Output 805 ml  Net -690 ml   Filed Weights   06/18/24 0500 06/19/24 0500 06/20/24 0500  Weight: 71.7 kg 70.6 kg 73.7 kg    Examination:  General exam: Not in any distress.  Awake and alert.  Respiratory system: Clear to auscultation.    Cardiovascular system: S1-S2.   Gastrointestinal system: Soft and nontender. Central nervous system: Awake and alert Extremities: No edema   Data Reviewed: I have personally reviewed following labs and imaging studies  CBC: Recent Labs  Lab 06/16/24 0500 06/17/24 0311 06/18/24 0051 06/19/24 0323 06/20/24 0251  WBC 20.2* 16.6* 11.7* 10.1 10.3  HGB 8.3* 8.0* 7.9* 8.1* 8.3*  HCT 25.9* 24.3* 24.4* 26.0* 26.5*  MCV 90.6 89.7 89.1 88.7 89.2  PLT 359 388 442* 550* 589*   Basic Metabolic Panel: Recent Labs  Lab 06/14/24 0600 06/15/24 1206 06/16/24 0500 06/17/24 0311 06/18/24 0051  NA 140 139 139 140 140  K 4.4 4.4 3.8 3.9 3.7  CL 105 107 109 110 108  CO2 25 23 21* 23 23  GLUCOSE 132* 130* 114* 117* 104*  BUN 24* 36* 31* 23 20  CREATININE 0.38* 0.48 0.34* 0.36* 0.33*  CALCIUM  8.7* 8.5* 8.2* 8.2* 8.1*  MG 2.2  --   --  2.1  --   PHOS 3.3  --   --  2.6  --    GFR: Estimated Creatinine Clearance: 58.8 mL/min (A) (by C-G formula based on SCr of 0.33 mg/dL (L)). Liver Function Tests: Recent Labs  Lab 06/14/24 0600 06/17/24 0311  AST 33 23  ALT 20 20  ALKPHOS 73 101  BILITOT 0.3 0.4  PROT 5.3* 4.9*  ALBUMIN  3.1* 2.5*   No results for input(s): LIPASE, AMYLASE in the last 168 hours. No results for input(s): AMMONIA in the last 168 hours. Coagulation Profile: Recent Labs   Lab 06/16/24 1112  INR 1.4*   Cardiac Enzymes: No results for input(s): CKTOTAL, CKMB, CKMBINDEX, TROPONINI in the last 168 hours. BNP (last 3 results) No results for input(s): PROBNP in the last 8760 hours. HbA1C: No results for input(s): HGBA1C in the last 72 hours. CBG: Recent Labs  Lab 06/19/24 1142 06/19/24 1838 06/19/24 2330 06/20/24 0527 06/20/24 1218  GLUCAP 114* 133* 110* 114* 118*   Lipid Profile: No results for input(s): CHOL, HDL, LDLCALC, TRIG, CHOLHDL, LDLDIRECT in the last 72 hours.  Thyroid  Function Tests: No results for input(s): TSH, T4TOTAL, FREET4, T3FREE, THYROIDAB in the last 72 hours. Anemia Panel: No results for input(s): VITAMINB12, FOLATE, FERRITIN, TIBC, IRON, RETICCTPCT in the last 72 hours.  Sepsis Labs: Recent Labs  Lab 06/16/24 1112  LATICACIDVEN 0.8    Recent Results (from the past 240 hours)  Urine Culture (for pregnant, neutropenic or urologic patients or patients with an indwelling urinary catheter)     Status: Abnormal   Collection Time: 06/15/24  6:18 PM   Specimen: Urine, Clean Catch  Result Value Ref Range Status   Specimen Description   Final    URINE, CLEAN CATCH Performed at [facility], [address], [place], [place] [address]    Special Requests   Final    NONE Performed at [facility], [address], [place], [place] [address]    Culture 20,000 COLONIES/mL ENTEROCOCCUS FAECIUM (A)  Final   Report Status 06/18/2024 FINAL  Final   Organism ID, Bacteria ENTEROCOCCUS FAECIUM (A)  Final      Susceptibility   Enterococcus faecium - MIC*    AMPICILLIN <=2 SENSITIVE Sensitive     NITROFURANTOIN  32 SENSITIVE Sensitive     VANCOMYCIN  <=0.5 SENSITIVE Sensitive     * 20,000 COLONIES/mL ENTEROCOCCUS FAECIUM  MRSA Next Gen by PCR, Nasal     Status: None   Collection Time: 06/16/24  7:43 AM   Specimen: Nasal Mucosa; Nasal Swab  Result  Value Ref Range Status   MRSA by PCR Next Gen NOT DETECTED NOT DETECTED Final    Comment: (NOTE) The GeneXpert MRSA Assay (FDA approved for NASAL specimens only), is one component of a comprehensive MRSA colonization surveillance program. It is not intended to diagnose MRSA infection nor to guide or monitor treatment for MRSA infections. Test performance is not FDA approved in patients less than 66 years old. Performed at [facility], [address] [place], [place] [address]   Aerobic/Anaerobic Culture w Gram Stain (surgical/deep wound)     Status: None (Preliminary result)   Collection Time: 06/16/24  5:14 PM   Specimen: Abscess  Result Value Ref Range Status   Specimen Description   Final    ABSCESS Performed at [facility], [address] [place], [place]  [address]    Special Requests   Final    ABDOMEN Performed at [facility], [address] [place], [place] [address]    Gram Stain   Final    ABUNDANT WBC PRESENT, PREDOMINANTLY PMN NO ORGANISMS SEEN    Culture   Final    NO GROWTH 4 DAYS NO ANAEROBES ISOLATED; CULTURE IN PROGRESS FOR 5 DAYS Performed at [facility], [address], [place], [place] [address]    Report Status PENDING  Incomplete         Radiology Studies: No results found.       Scheduled Meds:  Chlorhexidine  Gluconate Cloth  6 each Topical Daily   enoxaparin  (LOVENOX ) injection  40 mg Subcutaneous Q24H   feeding supplement  1 Container Oral TID BM   feeding supplement  237 mL Oral BID BM   insulin  aspart  0-9 Units Subcutaneous Q6H   LORazepam   0.5 mg Intravenous Once   methocarbamol  (ROBAXIN ) injection  500 mg Intravenous Q8H   rivastigmine   9.5 mg Transdermal Daily   sodium chloride  flush  10-40 mL Intracatheter Q12H   sodium chloride  flush  5 mL Intracatheter Q8H   Continuous Infusions:  lactated ringers  Stopped (06/19/24 0505)   meropenem  (MERREM ) IV 1 g  (06/20/24 0547)   TPN ADULT (ION) 80 mL/hr at 06/19/24 1800   TPN ADULT (ION)       LOS: 18 days    Time spent: 35 minutes    Leatrice LILLETTE Chapel, MD [facility]   If 7PM-7AM, please contact night-coverage [email or web address]  06/20/2024, 2:24 PM

## 2024-06-20 NOTE — Progress Notes (Signed)
 11 Days Post-Op   Subjective/Chief Complaint: No complaints   Objective: Vital signs in last 24 hours: Temp:  [98.2 F (36.8 C)-98.8 F (37.1 C)] 98.7 F (37.1 C) (09/07 0424) Pulse Rate:  [79-100] 94 (09/07 0424) Resp:  [15-20] 18 (09/07 0424) BP: (114-143)/(54-98) 138/98 (09/07 0424) SpO2:  [92 %-100 %] 92 % (09/07 0208) Weight:  [73.7 kg] 73.7 kg (09/07 0500) Last BM Date : 06/16/24  Intake/Output from previous day: 09/06 0701 - 09/07 0700 In: 135 [P.O.:110; I.V.:20] Out: 805 [Urine:800; Drains:5] Intake/Output this shift: No intake/output data recorded.  General appearance: alert and cooperative Resp: clear to auscultation bilaterally Cardio: regular rate and rhythm GI: soft, mild tenderness  Lab Results:  Recent Labs    06/19/24 0323 06/20/24 0251  WBC 10.1 10.3  HGB 8.1* 8.3*  HCT 26.0* 26.5*  PLT 550* 589*   BMET Recent Labs    06/18/24 0051  NA 140  K 3.7  CL 108  CO2 23  GLUCOSE 104*  BUN 20  CREATININE 0.33*  CALCIUM  8.1*   PT/INR No results for input(s): LABPROT, INR in the last 72 hours. ABG No results for input(s): PHART, HCO3 in the last 72 hours.  Invalid input(s): PCO2, PO2  Studies/Results: No results found.  Anti-infectives: Anti-infectives (From admission, onward)    Start     Dose/Rate Route Frequency Ordered Stop   06/17/24 0900  vancomycin  (VANCOCIN ) IVPB 1000 mg/200 mL premix  Status:  Discontinued        1,000 mg 200 mL/hr over 60 Minutes Intravenous Every 24 hours 06/16/24 0839 06/16/24 1103   06/16/24 1600  metroNIDAZOLE  (FLAGYL ) IVPB 500 mg  Status:  Discontinued        500 mg 100 mL/hr over 60 Minutes Intravenous Every 12 hours 06/16/24 0840 06/16/24 1104   06/16/24 1500  ceFEPIme  (MAXIPIME ) 2 g in sodium chloride  0.9 % 100 mL IVPB  Status:  Discontinued        2 g 200 mL/hr over 30 Minutes Intravenous Every 12 hours 06/16/24 0840 06/16/24 1103   06/16/24 1400  meropenem  (MERREM ) 1 g in sodium  chloride 0.9 % 100 mL IVPB        1 g 200 mL/hr over 30 Minutes Intravenous Every 8 hours 06/16/24 1105     06/16/24 0815  vancomycin  (VANCOREADY) IVPB 1500 mg/300 mL        1,500 mg 150 mL/hr over 120 Minutes Intravenous  Once 06/16/24 0723 06/16/24 1608   06/10/24 0300  ceFEPIme  (MAXIPIME ) 2 g in sodium chloride  0.9 % 100 mL IVPB  Status:  Discontinued        2 g 200 mL/hr over 30 Minutes Intravenous Every 12 hours 06/09/24 1732 06/16/24 0840   06/09/24 2100  metroNIDAZOLE  (FLAGYL ) IVPB 500 mg  Status:  Discontinued        500 mg 100 mL/hr over 60 Minutes Intravenous Every 12 hours 06/09/24 1725 06/16/24 0840   06/09/24 1730  ceFEPIme  (MAXIPIME ) 1 g in sodium chloride  0.9 % 100 mL IVPB  Status:  Discontinued        1 g 200 mL/hr over 30 Minutes Intravenous Every 12 hours 06/09/24 1725 06/09/24 1731   06/09/24 1500  ceFEPIme  (MAXIPIME ) 2 g in sodium chloride  0.9 % 100 mL IVPB        2 g 200 mL/hr over 30 Minutes Intravenous  Once 06/09/24 1454 06/09/24 1743   06/03/24 1130  vancomycin  (VANCOCIN ) IVPB 1000 mg/200 mL premix  Status:  Discontinued        1,000 mg 200 mL/hr over 60 Minutes Intravenous Every 24 hours 06/02/24 1149 06/03/24 0949   06/03/24 0000  ceFEPIme  (MAXIPIME ) 2 g in sodium chloride  0.9 % 100 mL IVPB        2 g 200 mL/hr over 30 Minutes Intravenous Every 12 hours 06/02/24 1149 06/08/24 1553   06/02/24 2200  metroNIDAZOLE  (FLAGYL ) IVPB 500 mg  Status:  Discontinued        500 mg 100 mL/hr over 60 Minutes Intravenous Every 12 hours 06/02/24 1113 06/09/24 1730   06/02/24 1100  ceFEPIme  (MAXIPIME ) 2 g in sodium chloride  0.9 % 100 mL IVPB        2 g 200 mL/hr over 30 Minutes Intravenous  Once 06/02/24 1045 06/02/24 1449   06/02/24 1100  vancomycin  (VANCOCIN ) IVPB 1000 mg/200 mL premix        1,000 mg 200 mL/hr over 60 Minutes Intravenous  Once 06/02/24 1045 06/02/24 1233   06/02/24 1100  metroNIDAZOLE  (FLAGYL ) IVPB 500 mg        500 mg 100 mL/hr over 60 Minutes  Intravenous  Once 06/02/24 1045 06/02/24 1818       Assessment/Plan: s/p Procedure(s) with comments: LAPAROSCOPY, DIAGNOSTIC, EXPLORATORY LAPAROTOMY WITH SMALL BOWEL RESECTION (N/A) - Possible laparotomy Advance diet. Continue to work on oral intake   Dementia seems to keep her from eating Nurses state she passed her swallow study Continue tpn for nutrition Dressing changes to midline wound, clean POD 11, s/p 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 Curvin 8/27 - WBC continues to trend down-10.1; afebrile - IR drained collection 9/3; cxs pending. - PT/OT eval for mobilization - pulm toilet - multi-modal pain control - BID WD dressing change to midline wound   FEN: Okay for regular diet from our perspective VTE: Lovenox  ID: cefepime /flagyl  for bacteremia, Vanc given 9/3 for possible PNA per medicine   - per TRH -  Early onset Alzheimer's dementia COPD with emphysema  Hx of HTN HLD PAD Bacteremia likely secondary to above.  LOS: 18 days    Deward Curvin III 06/20/2024

## 2024-06-21 DIAGNOSIS — E785 Hyperlipidemia, unspecified: Secondary | ICD-10-CM | POA: Diagnosis not present

## 2024-06-21 DIAGNOSIS — I739 Peripheral vascular disease, unspecified: Secondary | ICD-10-CM | POA: Diagnosis not present

## 2024-06-21 DIAGNOSIS — R6510 Systemic inflammatory response syndrome (SIRS) of non-infectious origin without acute organ dysfunction: Secondary | ICD-10-CM | POA: Diagnosis not present

## 2024-06-21 DIAGNOSIS — J449 Chronic obstructive pulmonary disease, unspecified: Secondary | ICD-10-CM | POA: Diagnosis not present

## 2024-06-21 LAB — PHOSPHORUS: Phosphorus: 3 mg/dL (ref 2.5–4.6)

## 2024-06-21 LAB — COMPREHENSIVE METABOLIC PANEL WITH GFR
ALT: 29 U/L (ref 0–44)
AST: 33 U/L (ref 15–41)
Albumin: 2.7 g/dL — ABNORMAL LOW (ref 3.5–5.0)
Alkaline Phosphatase: 113 U/L (ref 38–126)
Anion gap: 9 (ref 5–15)
BUN: 18 mg/dL (ref 8–23)
CO2: 28 mmol/L (ref 22–32)
Calcium: 8.6 mg/dL — ABNORMAL LOW (ref 8.9–10.3)
Chloride: 105 mmol/L (ref 98–111)
Creatinine, Ser: 0.35 mg/dL — ABNORMAL LOW (ref 0.44–1.00)
GFR, Estimated: >60 mL/min (ref >60)
Glucose, Bld: 118 mg/dL — ABNORMAL HIGH (ref 70–99)
Potassium: 4 mmol/L (ref 3.5–5.1)
Sodium: 142 mmol/L (ref 135–145)
Total Bilirubin: 0.3 mg/dL (ref 0.0–1.2)
Total Protein: 5.4 g/dL — ABNORMAL LOW (ref 6.5–8.1)

## 2024-06-21 LAB — CBC
HCT: 26 % — ABNORMAL LOW (ref 36.0–46.0)
Hemoglobin: 8.4 g/dL — ABNORMAL LOW (ref 12.0–15.0)
MCH: 28.8 pg (ref 26.0–34.0)
MCHC: 32.3 g/dL (ref 30.0–36.0)
MCV: 89 fL (ref 80.0–100.0)
Platelets: 583 K/uL — ABNORMAL HIGH (ref 150–400)
RBC: 2.92 MIL/uL — ABNORMAL LOW (ref 3.87–5.11)
RDW: 14.9 % (ref 11.5–15.5)
WBC: 11.2 K/uL — ABNORMAL HIGH (ref 4.0–10.5)
nRBC: 0 % (ref 0.0–0.2)

## 2024-06-21 LAB — GLUCOSE, CAPILLARY
Glucose-Capillary: 116 mg/dL — ABNORMAL HIGH (ref 70–99)
Glucose-Capillary: 117 mg/dL — ABNORMAL HIGH (ref 70–99)
Glucose-Capillary: 120 mg/dL — ABNORMAL HIGH (ref 70–99)
Glucose-Capillary: 121 mg/dL — ABNORMAL HIGH (ref 70–99)

## 2024-06-21 LAB — AEROBIC/ANAEROBIC CULTURE W GRAM STAIN (SURGICAL/DEEP WOUND): Culture: NO GROWTH

## 2024-06-21 LAB — TRIGLYCERIDES: Triglycerides: 132 mg/dL (ref <150)

## 2024-06-21 LAB — MAGNESIUM: Magnesium: 2.6 mg/dL — ABNORMAL HIGH (ref 1.7–2.4)

## 2024-06-21 MED ORDER — MEGESTROL ACETATE 400 MG/10ML PO SUSP
400.0000 mg | Freq: Two times a day (BID) | ORAL | Status: DC
Start: 2024-06-21 — End: 2024-06-24
  Administered 2024-06-21 – 2024-06-24 (×6): 400 mg via ORAL
  Filled 2024-06-21 (×7): qty 10

## 2024-06-21 MED ORDER — TRAVASOL 10 % IV SOLN
INTRAVENOUS | Status: AC
  Filled 2024-06-21: qty 864

## 2024-06-21 MED ORDER — NORTRIPTYLINE HCL 25 MG PO CAPS
25.0000 mg | ORAL_CAPSULE | Freq: Every day | ORAL | Status: AC
Start: 2024-06-21 — End: 2024-06-23
  Administered 2024-06-21 – 2024-06-22 (×2): 25 mg via ORAL
  Filled 2024-06-21 (×3): qty 1

## 2024-06-21 NOTE — Progress Notes (Signed)
 Physical Therapy Treatment Patient Details Name: Lindsay Mason MRN: [id number] DOB: [birth year] Today's Date: 06/21/2024   History of Present Illness Pt is 73 yo female presented on 06/02/24  with + blood culture with staph species, lethargy, and worsening mental status. Pt admitted with SIRS, abdominal pain, N/V likely secondary to acute diverticulitis, and SBO vs ileus.  Pt with NG tube and surgery following. s/p exploratory lap with SB resection 2* ischemic bowel 06/09/24.   Pt also with metabolic encephalopathy on background of Alzheimer's dementia.  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 She was somewhat restless, c/o headache and was tearful, RN notified. Pt was able to transfer bed to recliner but was unable to safely ambulate as she repeatedly released hands from RW and was unsteady, she declined hand held assist and stated she just needed to sit.     If plan is discharge home, recommend the following: A lot of help with bathing/dressing/bathroom;Assistance with cooking/housework;Assist for transportation;Help with stairs or ramp for entrance;Direct supervision/assist for medications management;Direct supervision/assist for financial management;Supervision due to cognitive status;A lot of help with walking and/or transfers   Can travel by private vehicle     No  Equipment Recommendations  None recommended by PT    Recommendations for Other Services       Precautions / Restrictions Precautions Precautions: Fall Recall of Precautions/Restrictions: Impaired Precaution/Restrictions Comments: abdominal sx, dementia Restrictions Weight Bearing Restrictions Per Provider Order: No     Mobility  Bed Mobility Overal bed mobility: Needs  Assistance Bed Mobility: Supine to Sit     Supine to sit: Contact guard, HOB elevated, Used rails     General bed mobility comments: came to EOB with verbal/manual cues for technique    Transfers Overall transfer level: Needs assistance Equipment used: Rolling walker (2 wheels) Transfers: Sit to/from Stand Sit to Stand: Min assist Stand pivot transfers: Min assist         General transfer comment: min A for balance upon standing; attempted to walk forwards but pt repeatedly released grip from RW and was unsteady, pt declined hand held assist and requested to sit, pt c/o headache and was teary, RN notified of headache    Ambulation/Gait                   Stairs             Wheelchair Mobility     Tilt Bed    Modified Rankin (Stroke Patients Only)       Balance Overall balance assessment: Needs assistance Sitting-balance support: Feet supported Sitting balance-Leahy Scale: Fair Sitting balance - Comments: posterior lean   Standing balance support: Bilateral upper extremity supported, During functional activity Standing balance-Leahy Scale: Poor Standing balance comment: attempted standing with HHA and face-to-face transfer, pt not following cues, anxious, resistive, pushing posteriorally despite cues and reassurance                            Communication Communication Communication: Impaired Factors Affecting Communication: Difficulty expressing self  Cognition Arousal: Alert Behavior During Therapy: Agitated, Restless, Anxious, Impulsive   PT - Cognitive impairments: History of cognitive impairments   Orientation impairments: Place, Time, Situation  PT - Cognition Comments: difficulty following commands even with multimodal cues, seemed to have trouble with vision as she could not locate the door that I was pointing to, she could not state how many fingers I was holding up Following commands:  Impaired Following commands impaired: Follows one step commands inconsistently, Follows one step commands with increased time    Cueing Cueing Techniques: Verbal cues, Gestural cues, Tactile cues, Visual cues  Exercises      General Comments        Pertinent Vitals/Pain Pain Assessment Pain Assessment: Faces Faces Pain Scale: Hurts even more Breathing: occasional labored breathing, short period of hyperventilation Negative Vocalization: occasional moan/groan, low speech, negative/disapproving quality Facial Expression: sad, frightened, frown Body Language: tense, distressed pacing, fidgeting Consolability: distracted or reassured by voice/touch PAINAD Score: 5 Pain Location: headache Pain Descriptors / Indicators: Aching, Grimacing Pain Intervention(s): Limited activity within patient's tolerance, Monitored during session, Patient requesting pain meds-RN notified, Repositioned    Home Living                          Prior Function            PT Goals (current goals can now be found in the care plan section) Acute Rehab PT Goals Patient Stated Goal: to go for long walks again PT Goal Formulation: With patient/family Time For Goal Achievement: 06/26/24 Potential to Achieve Goals: Fair Progress towards PT goals: Progressing toward goals    Frequency    Min 2X/week      PT Plan      Co-evaluation              AM-PAC PT 6 Clicks Mobility   Outcome Measure  Help needed turning from your back to your side while in a flat bed without using bedrails?: A Little Help needed moving from lying on your back to sitting on the side of a flat bed without using bedrails?: A Little Help needed moving to and from a bed to a chair (including a wheelchair)?: A Little Help needed standing up from a chair using your arms (e.g., wheelchair or bedside chair)?: A Little Help needed to walk in hospital room?: A Lot Help needed climbing 3-5 steps with a railing? :  Total 6 Click Score: 15    End of Session Equipment Utilized During Treatment: Gait belt Activity Tolerance: Patient tolerated treatment well Patient left: in chair;with chair alarm set;with call bell/phone within reach Nurse Communication: Mobility status;Patient requests pain meds PT Visit Diagnosis: Difficulty in walking, not elsewhere classified (R26.2);Unsteadiness on feet (R26.81)     Time: 9087-9068 PT Time Calculation (min) (ACUTE ONLY): 19 min  Charges:    $Therapeutic Activity: 8-22 mins PT General Charges $$ ACUTE PT VISIT: 1 Visit                     Sylvan Delon Copp PT 06/21/2024  Acute Rehabilitation Services  Office [phone]

## 2024-06-21 NOTE — Progress Notes (Signed)
  12 Days Post-Op   Chief Complaint/Subjective: No abdominal pain, seems to keep forgetting to eat  Objective: Vital signs in last 24 hours: Temp:  [98.5 F (36.9 C)-98.9 F (37.2 C)] 98.5 F (36.9 C) (09/08 0534) Pulse Rate:  [96-102] 102 (09/08 1205) Resp:  [16-18] 16 (09/08 1205) BP: (122-157)/(82-98) 141/87 (09/08 1205) SpO2:  [95 %-98 %] 95 % (09/08 1205) Weight:  [69.8 kg] 69.8 kg (09/08 0709) Last BM Date : 06/16/24 Intake/Output from previous day: 09/07 0701 - 09/08 0700 In: 1154.1 [P.O.:350; I.V.:284.1; IV Piggyback:515] Out: 5 [Drains:5]  PE: Gen: NAD Resp: nonlabored Card: tachycardic Abd: soft, open wound with clean base, one area in the upper wound concerning for fascial dehis.  Lab Results:  Recent Labs    06/20/24 0251 06/21/24 0243  WBC 10.3 11.2*  HGB 8.3* 8.4*  HCT 26.5* 26.0*  PLT 589* 583*   Recent Labs    06/21/24 0243  NA 142  K 4.0  CL 105  CO2 28  GLUCOSE 118*  BUN 18  CREATININE 0.35*  CALCIUM  8.6*   No results for input(s): LABPROT, INR in the last 72 hours.    Component Value Date/Time   NA 142 06/21/2024 0243   NA 144 03/02/2024 1554   K 4.0 06/21/2024 0243   CL 105 06/21/2024 0243   CO2 28 06/21/2024 0243   GLUCOSE 118 (H) 06/21/2024 0243   BUN 18 06/21/2024 0243   BUN 20 03/02/2024 1554   CREATININE 0.35 (L) 06/21/2024 0243   CALCIUM  8.6 (L) 06/21/2024 0243   PROT 5.4 (L) 06/21/2024 0243   PROT 6.1 03/02/2024 1554   ALBUMIN  2.7 (L) 06/21/2024 0243   ALBUMIN  3.9 03/02/2024 1554   AST 33 06/21/2024 0243   ALT 29 06/21/2024 0243   ALKPHOS 113 06/21/2024 0243   BILITOT 0.3 06/21/2024 0243   BILITOT 0.2 03/02/2024 1554   GFRNONAA >60 06/21/2024 0243   GFRAA >60 01/22/2020 0226    Assessment/Plan  s/p Procedure(s): LAPAROSCOPY, DIAGNOSTIC, EXPLORATORY LAPAROTOMY WITH SMALL BOWEL RESECTION 06/09/2024    FEN - reg diet, cal count showing 25 % of cal goals VTE - lovenox  ID - no abx needs Disposition - dementia  limiting enteric nutrition   LOS: 19 days   I reviewed last 24 h vitals and pain scores, last 48 h intake and output, last 24 h labs and trends, and last 24 h imaging results.  This care required straight-forward level of medical decision making.   Herlene [facility] at [facility] 06/21/2024, 12:58 PM Please see Amion for pager number during day hours 7:00am-4:30pm or 7:00am -11:30am on weekends

## 2024-06-21 NOTE — Plan of Care (Signed)

## 2024-06-21 NOTE — Progress Notes (Signed)
 PHARMACY - TOTAL PARENTERAL NUTRITION CONSULT NOTE   Indication: Prolonged ileus  Patient Measurements: Height: 5' 2 (157.5 cm) Weight: 69.8 kg (153 lb 12.8 oz) IBW/kg (Calculated) : 50.1 TPN AdjBW (KG): 61.3 Body mass index is 28.13 kg/m.   Assessment: Presented to ED with N/V/D and abdominal pain x 3d on 05/31/24. CT negative. Possibly from diverticulitis and placed on Cefepime /Flagyl . Concern for SBO, NGT placed. Continues to have persistent pain and distention. No significant intake since 8/21. Pharmacy consulted to start TPN on 8/26. S/p ex lap with small bowel resection on 8/27.    Glucose / Insulin : No h/o DM -CBGs within goal < 150 -No SSI given in last 24 hrs Electrolytes: Mg 2.6, all others WNL including coCa (9.6) Renal: Scr and BUN WNL Hepatic: LFTs WNL; alb 2.7; TG stable Intake / Output; MIVF:  -LBM 9/3, UOP not documented -LR 30 ml/hr not running - order d/c today  GI Imaging: 8/19 CT: Scattered colonic diverticulosis. No active diverticulitis.   8/22, 8/23, 8/25, 8/26 Abd Xray: all SBO  8/26 CT: severely thickened and inflamed central pelvic loops of small bowel concerning for ischemia. Concern for mesenteric volvulus or closed loop obstruction.  9/2 CT: 1. Interval abdominal surgery. There is a small amount of free air in the upper abdomen likely related to recent surgery. 2. New enhancing fluid collections in the right paracolic gutter and central pelvis worrisome for abscesses. 3. Persistent clustered small bowel loops in the pelvis with wall thickening and mesenteric edema. There is a small amount of air within the mesentery at this level which is new from prior. 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 4. Dilated small bowel loops in the upper abdomen worrisome for small bowel obstruction. 5. Diffuse bladder wall thickening with small amount of air in the bladder. Findings may be related to recent instrumentation or infection. 6. Small amount of simple free  fluid in the right upper quadrant and left upper quadrant. 7. Body wall edema. 8. Aortic atherosclerosis.  GI Surgeries / Procedures:  8/27 Ex lap with small bowel resection 9/3: IR drain placement to abd/pelvic fluid collection  Central access: PICC TPN start date: 8/26  Nutritional Goals: Goal TPN rate is 80 mL/hr (provides 86 g of protein and  1759 kcals per day)  RD Assessment: 9/6 Kcal 1650-1850 Protein 75-90 g Fluid 1.8 L/day  Current Nutrition:  Boost/Breeze 1 container TID Ensure Plus High Protein BID  9/4 Regular diet - not eating much. Surgery deferring to TRH for decision regarding TPN continuation.   9/6 Discussed enteral nutrition options with RN, MD, RD.  Calorie count ordered.  Add magic cup with meals.  9/8 See RN notes 9/7 for meal documentation - breakfast 25%, lunch 10%, dinner 25%  Patient with underlying dementia, likely a barrier to her meeting adequate nutritional intake. Megace  added per TRH today.  Plan:  -Continue TPN at goal 80 mL/hr  -Electrolytes in TPN:  Na 50 mEq/L K 40 mEq/L Ca 5 mEq/L Mg 3 mEq/L Phos 15 mmol/L Cl:Ac 1:1 -Add standard MVI and trace elements to TPN -Continue Sensitive q6h SSI and adjust as needed  -Monitor TPN labs on Mon/Thurs, and PRN -Continue to follow for ability to maintain greater PO intake   Stefano MARLA Bologna, PharmD, BCPS Clinical Pharmacist 06/21/2024 12:20 PM

## 2024-06-21 NOTE — Progress Notes (Signed)
 Calorie Count Note  48 hour calorie count ordered.  Diet: Regular Supplements:  -Ensure Plus High Protein po BID, each supplement provides 350 kcal and 20 grams of protein.  -Boost Breeze po TID, each supplement provides 250 kcal and 9 grams of protein   9/7: Breakfast: 100 kcals, 4g protein Lunch: 45 kcals, 1g protein Dinner: 150 kcals, 5g protein Supplements: 25% of Magic cup + 25% of Boost Breeze = 130 kcals, 4g protein  Total intake: 425 kcal (25% of minimum estimated needs)  14g protein (18% of minimum estimated needs)  Nutrition Dx: Inadequate oral intake related to lethargy/confusion (dementia, nausea) as evidenced by meal completion < 50%. Ongoing.   Goal: Pt to meet >/= 90% of their estimated nutrition needs   Intervention:  -Continue Ensure and Boost supplements -Continue Magic cups -TPN per Pharmacy -Calorie Count  Lindsay Lee, MS, RD, LDN Inpatient Clinical Dietitian Contact via Secure chat

## 2024-06-21 NOTE — Progress Notes (Signed)
 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  Patient presented to the hospital with positive blood culture with staph species.  Patient had lethargy and worsening mental status.  In the ED patient had slightly low blood pressure.  White blood cell was elevated at 13.  CT abdomen and pelvis done on 06/01/2024 revealed no acute finding.  Patient was admitted to the hospital for further evaluation and treatment.  During hospitalization, patient continued to have abdominal pain and distention.  General surgery was consulted.  Repeat CT scanning of the abdomen and pelvis with contrast done on 06/08/2024 revealed severely thickened and inflamed central pelvic loops of small bowel concerning for ischemia. Complex appearance due to indistinct bowel wall margins, surrounding edema, dilated small bowel extending towards these loops, and at least some sense of twisting of bowel along adjacent branches of the superior mesenteric vein concerning for mesenteric volvulus or closed loop obstruction.  Patient eventually underwent exploratory laparotomy with a small bowel resection 06/09/2024.  Patient is currently on TPN, with very poor oral intake.  Leukocytosis noted.  Repeat CT scan 06/15/2024 revealed new enhancing fluid collections in the right paracolic gutter and central pelvis worrisome for abscesses. IR consulted for drain placement. Underwent drain placement on 9/03.  Repeat CT scan of abdomen and pelvis with contrast done on 06/20/2024 revealed Persistent central pelvic fluid collection measuring 3.6 x 3.5 cm, decreased since prior study.  Air within the  urinary bladder, presumably from recent instrumentation. If there has not been catheterization recently, this would be concerning for fistula.    06/21/2024: Leukocytosis is improving.  Patient continues to have very poor p.o. intake.  Will try Megace .  Patient remains on TPN.  Assessment & Plan:   Principal Problem:   SIRS (systemic inflammatory response syndrome) (HCC) Active Problems:   COPD with emphysema (HCC)   Thoracic aortic atherosclerosis (HCC)   History of essential hypertension   Severe early onset Alzheimer dementia (HCC)   PAD (peripheral artery disease) (HCC)   Confusion and disorientation   Positive blood culture   Nausea and vomiting   Abdominal pain   Hyperlipidemia   Abnormal EKG  Abdominal pain/  Nausea and vomiting: Sepsis likely secondary to acute diverticulitis Small bowel obstruction/ileus -Patient initially had leukocytosis with hypotension and lethargy and clinical suspicion for diverticulitis/suggestive of possible sepsis.  Initial CT scan of the abdomen initially was negative for acute findings but had clinical symptoms of diverticulitis.   --Treated initially with  cefepime  and metronidazole  14 days.  Blood cultures negative. -General surgery was consulted due to persistent abdominal distention and pain.  Initial imaging with x-ray of the abdomen showed possible SBO/ileus.  Patient was then started on conservative treatment with NG tube IV fluid with electrolyte replacement but continued to have pain and distention with NG output.  Repeat CT scan done 06/08/2024 showed significant thickening of the pelvic loops of the small bowel concerning for ischemia. - Patient underwent diagnostic laparoscopy, exploratory laparotomy with small bowel resection on 06/09/2024.  Was on NG tube which has come off at this time.  -On TPN, start regular diet.  -9/02: WBC increased to 20, CT scan ordered. CT scan showed New enhancing fluid collections  in the right paracolic gutter  and central pelvis worrisome for abscesses.. some finding worrisome for SBO, mesenteric air concern for Ischemic  bowel.  -Lactic acid normal.  -underwent drain placement by IR on 9/03. Follow culture results.  -9/03; Change cefepime  and flagyl  to Meropenem . Would like to avoid Cefepime  due to encephalopathy.  06/21/2024: See above documentation.  Very poor appetite persists.  WBC of 11.2.  Patient remains on TPN.  Calorie count is in progress.  Continue to trend WBC.  Hypophosphatemia.  Replaced.  Phosphorus of 3 today.   Hypokalemia. Replaced. Potassium of 4 today.    Metabolic Encephalopathy on the background of Alzheimer's dementia.   Likely secondary to infection.  On nortriptyline  trazodone  Exelon  at home..  On mittens and one-to-one sitter. Delirium precaution.  Change Cefepime  to Meropenem , to avoid Delirium 06/19/2024: Delirium has resolved.   Patient has underlying dementia.  Positive blood culture with staph species. Contamination suspected.   COPD with emphysema  Continue albuterol  inhaler.   Hyperlipidemia PAD (peripheral artery disease)  Thoracic aortic atherosclerosis -Holding Lipitor   History of essential hypertension On as needed IV hydralazine    Weakness, deconditioning.  Seen by PT OT and recommended skilled nursing facility placement    Dysphagia; not eating much. Son report she is having issues with coordinating swallowing. Speech consults.  06/18/2024: Speech evaluation input is appreciated.  Please see documentation.   Nutrition Problem: Inadequate oral intake Etiology: lethargy/confusion (dementia, nausea)    Signs/Symptoms: meal completion < 50%    Interventions: TPN  Estimated body mass index is 28.13 kg/m as calculated from the following:   Height as of this encounter: 5' 2 (1.575 m).   Weight as of this encounter: 69.8 kg.   DVT prophylaxis: Lovenox  Code Status: Full code Family Communication: Son at bedside, updated Disposition  Plan:  Status is: Inpatient Remains inpatient appropriate because: management o post op     Consultants:  Surgery  Interventional radiology  Procedures:  Diagnostic laparoscopy, exploratory laparotomy with small bowel resection CT guided drainage of right lower quadrant abdominal abscess (10 French drainage catheter was placed)  Antimicrobials:  IV meropenem  1 g every 8 hourly.  Subjective: No history from patient.    Objective: Vitals:   06/20/24 1831 06/20/24 2009 06/21/24 0534 06/21/24 0709  BP: 122/84 130/82 (!) 157/98   Pulse: 100 96 97   Resp: 18 18 18    Temp:  98.9 F (37.2 C) 98.5 F (36.9 C)   TempSrc:      SpO2:  96% 98%   Weight:    69.8 kg  Height:        Intake/Output Summary (Last 24 hours) at 06/21/2024 1015 Last data filed at 06/20/2024 1800 Gross per 24 hour  Intake 684.09 ml  Output --  Net 684.09 ml   Filed Weights   06/19/24 0500 06/20/24 0500 06/21/24 0709  Weight: 70.6 kg 73.7 kg 69.8 kg    Examination:  General exam: Not in any distress.  Awake and alert.  Respiratory system: Clear to auscultation.    Cardiovascular system: S1-S2.   Gastrointestinal system: Soft and nontender. Central nervous system: Awake and alert Extremities: No edema   Data Reviewed: I have personally reviewed following labs and imaging studies  CBC: Recent Labs  Lab 06/17/24 0311 06/18/24 0051 06/19/24 0323 06/20/24 0251 06/21/24 0243  WBC 16.6* 11.7* 10.1 10.3 11.2*  HGB 8.0* 7.9* 8.1* 8.3* 8.4*  HCT 24.3* 24.4* 26.0* 26.5* 26.0*  MCV 89.7 89.1 88.7 89.2 89.0  PLT 388 442* 550* 589* 583*   Basic Metabolic Panel: Recent Labs  Lab 06/15/24 1206 06/16/24 0500 06/17/24 0311 06/18/24 0051 06/21/24 0243  NA 139 139 140 140 142  K 4.4 3.8 3.9 3.7 4.0  CL 107 109 110 108 105  CO2 23 21* 23 23 28   GLUCOSE 130* 114* 117* 104* 118*  BUN 36* 31* 23 20 18   CREATININE 0.48 0.34* 0.36* 0.33* 0.35*  CALCIUM  8.5* 8.2* 8.2* 8.1* 8.6*  MG  --   --  2.1  --   2.6*  PHOS  --   --  2.6  --  3.0   GFR: Estimated Creatinine Clearance: 57.3 mL/min (A) (by C-G formula based on SCr of 0.35 mg/dL (L)). Liver Function Tests: Recent Labs  Lab 06/17/24 0311 06/21/24 0243  AST 23 33  ALT 20 29  ALKPHOS 101 113  BILITOT 0.4 0.3  PROT 4.9* 5.4*  ALBUMIN  2.5* 2.7*   No results for input(s): LIPASE, AMYLASE in the last 168 hours. No results for input(s): AMMONIA in the last 168 hours. Coagulation Profile: Recent Labs  Lab 06/16/24 1112  INR 1.4*   Cardiac Enzymes: No results for input(s): CKTOTAL, CKMB, CKMBINDEX, TROPONINI in the last 168 hours. BNP (last 3 results) No results for input(s): PROBNP in the last 8760 hours. HbA1C: No results for input(s): HGBA1C in the last 72 hours. CBG: Recent Labs  Lab 06/20/24 0527 06/20/24 1218 06/20/24 1753 06/21/24 0007 06/21/24 0544  GLUCAP 114* 118* 107* 120* 116*   Lipid Profile: Recent Labs    06/21/24 0243  TRIG 132    Thyroid  Function Tests: No results for input(s): TSH, T4TOTAL, FREET4, T3FREE, THYROIDAB in the last 72 hours. Anemia Panel: No results for input(s): VITAMINB12, FOLATE, FERRITIN, TIBC, IRON, RETICCTPCT in the last 72 hours.  Sepsis Labs: Recent Labs  Lab 06/16/24 1112  LATICACIDVEN 0.8    Recent Results (from the past 240 hours)  Urine Culture (for pregnant, neutropenic or urologic patients or patients with an indwelling urinary catheter)     Status: Abnormal   Collection Time: 06/15/24  6:18 PM   Specimen: Urine, Clean Catch  Result Value Ref Range Status   Specimen Description   Final    URINE, CLEAN CATCH Performed at [facility], [address] [place], [place] [address]    Special Requests   Final    NONE Performed at [facility], [address], [place], [place] [address]    Culture 20,000 COLONIES/mL ENTEROCOCCUS FAECIUM (A)  Final   Report Status 06/18/2024 FINAL   Final   Organism ID, Bacteria ENTEROCOCCUS FAECIUM (A)  Final      Susceptibility   Enterococcus faecium - MIC*    AMPICILLIN <=2 SENSITIVE Sensitive     NITROFURANTOIN  32 SENSITIVE Sensitive     VANCOMYCIN  <=0.5 SENSITIVE Sensitive     * 20,000 COLONIES/mL ENTEROCOCCUS FAECIUM  MRSA Next Gen by PCR, Nasal     Status: None   Collection Time: 06/16/24  7:43 AM   Specimen: Nasal Mucosa; Nasal Swab  Result Value Ref Range Status   MRSA by PCR Next Gen NOT DETECTED NOT DETECTED Final    Comment: (NOTE) The GeneXpert MRSA Assay (FDA approved for NASAL specimens only), is one component of a comprehensive MRSA colonization surveillance program. It is not intended to diagnose MRSA infection nor to guide or monitor treatment for MRSA infections. Test performance is not FDA approved in patients less than 2 years  old. Performed at [facility], [address], [place], [place] [address]   Aerobic/Anaerobic Culture w Gram Stain (surgical/deep wound)     Status: None (Preliminary result)   Collection Time: 06/16/24  5:14 PM   Specimen: Abscess  Result Value Ref Range Status   Specimen Description   Final    ABSCESS Performed at [facility], [address] [place], [place] [address]    Special Requests   Final    ABDOMEN Performed at [facility], [address] [place], [place] [address]    Gram Stain   Final    ABUNDANT WBC PRESENT, PREDOMINANTLY PMN NO ORGANISMS SEEN    Culture   Final    NO GROWTH 4 DAYS NO ANAEROBES ISOLATED; CULTURE IN PROGRESS FOR 5 DAYS Performed at [facility], [address] [place], [place] [address]    Report Status PENDING  Incomplete         Radiology Studies: CT ABDOMEN PELVIS W CONTRAST Result Date: 06/20/2024 CLINICAL DATA:  Follow-up right lower quadrant drain placement EXAM: CT ABDOMEN AND PELVIS WITH CONTRAST TECHNIQUE: Multidetector CT imaging of the abdomen and pelvis was  performed using the standard protocol following bolus administration of intravenous contrast. RADIATION DOSE REDUCTION: This exam was performed according to the departmental dose-optimization program which includes automated exposure control, adjustment of the mA and/or kV according to patient size and/or use of iterative reconstruction technique. CONTRAST:  100mL OMNIPAQUE  IOHEXOL  300 MG/ML  SOLN COMPARISON:  None Available. FINDINGS: Lower chest: Ground-glass opacity dependently in the left lower lobe is similar to prior study and likely reflects atelectasis or scarring. No effusions. Hepatobiliary: No focal hepatic abnormality. Gallbladder unremarkable. Pancreas: No focal abnormality or ductal dilatation. Spleen: No focal abnormality.  Normal size. Adrenals/Urinary Tract: Diffuse left adrenal enlargement, stable. No hydronephrosis. Small amount of air within the urinary bladder, presumably related to recent catheterization. Stomach/Bowel: Sigmoid diverticulosis. No active diverticulitis. Stomach and small bowel decompressed, unremarkable. Vascular/Lymphatic: Diffuse aortic atherosclerosis. No evidence of aneurysm or adenopathy. Reproductive: No visible focal abnormality. Other: Right lower quadrant drain in place in the area of previous right paracolic gutter fluid collection. No measurable fluid now seen around the catheter. Inferior in the right lower quadrant is a 2nd fluid collection measuring 4.1 x 2.6 cm. It is unclear if this communicates with the larger drained right paracolic gutter fluid collection, but is persistence suggests these do not communicate. Central pelvic fluid collection again noted measuring 3.6 x 3.5 cm, decreased in size since prior study. Musculoskeletal: No acute bony abnormality. Diffuse degenerative disc and facet disease. IMPRESSION: Right paracolic gutter fluid collection has been drain with pigtail drainage catheter in place. No measurable fluid in the region. More inferiorly in  the right lower quadrant is a 4.1 x 2.6 cm fluid collection which likely does not communicate with the other fluid collection drained by the drainage catheter. Persistent central pelvic fluid collection measuring 3.6 x 3.5 cm, decreased since prior study. Air within the urinary bladder, presumably from recent instrumentation. If there has not been catheterization recently, this would be concerning for fistula. Aortic atherosclerosis. Sigmoid diverticulosis. Electronically Signed   By: Franky Crease M.D.   On: 06/20/2024 17:20         Scheduled Meds:  Chlorhexidine  Gluconate Cloth  6 each Topical Daily   enoxaparin  (LOVENOX ) injection  40 mg Subcutaneous Q24H   feeding supplement  1 Container Oral TID BM   feeding supplement  237 mL Oral BID BM   insulin  aspart  0-9 Units Subcutaneous Q6H   LORazepam   0.5 mg Intravenous Once   methocarbamol  (ROBAXIN ) injection  500 mg Intravenous Q8H   rivastigmine   9.5 mg Transdermal Daily   sodium chloride  flush  10-40 mL Intracatheter Q12H   sodium chloride  flush  5 mL Intracatheter Q8H   Continuous Infusions:  lactated ringers  Stopped (06/19/24 0505)   meropenem  (MERREM ) IV 1 g (06/21/24 0557)   TPN ADULT (ION) 80 mL/hr at 06/20/24 1732   TPN ADULT (ION)       LOS: 19 days    Time spent: 35 minutes    Leatrice LILLETTE Chapel, MD [facility]   If 7PM-7AM, please contact night-coverage [email or web address]  06/21/2024, 10:15 AM

## 2024-06-21 NOTE — Progress Notes (Addendum)
 Occupational Therapy Treatment Patient Details Name: Lindsay Mason MRN: [id number] DOB: Jun 12, 1951 Today's Date: 06/21/2024   History of present illness Pt is 73 yo female presented on 06/02/24  with + blood culture with staph species, lethargy, and worsening mental status. Pt admitted with SIRS, abdominal pain, N/V likely secondary to acute diverticulitis, and SBO vs ileus.  Pt with NG tube and surgery following. s/p exploratory lap with SB resection 2* ischemic bowel 06/09/24.   Pt also with metabolic encephalopathy on background of Alzheimer's dementia.  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 Pt seemed agitated and restless, wiping hands over and over with napkin and asking to eat her food with help. Pt agreeable to sit EOB, requiring CGA and increased time to initiate, pt participated in grooming/hygiene, UB dressing with assist and multimodal cues. Pt returned to supine with HOB elevated to safe position for eating meal. Pt required set up of lunch tray, cutting food up, opening containers, hand over hand initiating eating with R hand; pt has difficulty with carryover and task continuation but states that she wanted to eat more of her food. Throughout activity, pt continues to require max multimodal cuing and increased time for all motor planning, presents with confusion, restlessness, agitation and inability to comprehend directions without multimodal cues.OT will continue to follow acutely to maximize level of function and safety      If plan is discharge home, recommend the following:  Assistance with feeding;Direct supervision/assist for medications management;Assistance with  cooking/housework;Direct supervision/assist for financial management;Assist for transportation;Help with stairs or ramp for entrance;Supervision due to cognitive status;A lot of help with bathing/dressing/bathroom;A lot of help with walking and/or transfers   Equipment Recommendations  Other (comment) (defer)    Recommendations for Other Services      Precautions / Restrictions Precautions Precautions: Fall Recall of Precautions/Restrictions: Impaired Precaution/Restrictions Comments: abdominal sx, dementia Restrictions Weight Bearing Restrictions Per Provider Order: No       Mobility Bed Mobility Overal bed mobility: Needs Assistance Bed Mobility: Supine to Sit, Sit to Supine     Supine to sit: Contact guard, HOB elevated, Used rails Sit to supine: Min assist   General bed mobility comments: sat EOB x 14 minutes for hand washing and UB dressing to change gown with multimodal cues for initiation, task continuation. Pt returned supine in bed with HOB raised to safely for eating lunch. Pt with posterior leaning and required min A for balance/support    Transfers                         Balance Overall balance assessment: Needs assistance Sitting-balance support: Feet supported Sitting balance-Leahy Scale: Fair Sitting balance - Comments: posterior lean Postural control: Posterior lean                                 ADL either performed or assessed with clinical judgement   ADL Overall ADL's : Needs assistance/impaired   Eating/Feeding Details (indicate cue type and reason): required set up of lunch tray, cutting food up, opening containers, hand over hand initiating eating with R hand; pt has difficulty with carryover and task  continuation but states that she wanted to eat more of her food.required set up of lunch tray, cutting food up, opening containers, hand over hand initiating eating with R hand; pt has diffuclty with carryover and tasks  continuation but states that she wanted to eat more of her food Grooming: Wash/dry hands;Wash/dry face;Contact guard assist;Sitting           Upper Body Dressing : Moderate assistance;Sitting                          Extremity/Trunk Assessment Upper Extremity Assessment Upper Extremity Assessment: Generalized weakness;Right hand dominant   Lower Extremity Assessment Lower Extremity Assessment: Defer to PT evaluation   Cervical / Trunk Assessment Cervical / Trunk Assessment: Normal    Vision Ability to See in Adequate Light: 0 Adequate Patient Visual Report: No change from baseline     Perception     Praxis     Communication Communication Communication: Impaired Factors Affecting Communication: Difficulty expressing self   Cognition Arousal: Alert Behavior During Therapy: Anxious, Restless Cognition: History of cognitive impairments                               Following commands: Impaired Following commands impaired: Follows one step commands inconsistently, Follows one step commands with increased time      Cueing   Cueing Techniques: Verbal cues, Gestural cues, Tactile cues, Visual cues  Exercises      Shoulder Instructions       General Comments      Pertinent Vitals/ Pain       Pain Assessment Pain Assessment: Faces Faces Pain Scale: Hurts little more Negative Vocalization: occasional moan/groan, low speech, negative/disapproving quality Consolability: distracted or reassured by voice/touch Pain Location: ABD Pain Descriptors / Indicators: Aching, Grimacing Pain Intervention(s): Limited activity within patient's tolerance, Monitored during session, Premedicated before session, Repositioned  Home Living                                          Prior Functioning/Environment              Frequency  Min 2X/week        Progress Toward Goals  OT Goals(current goals can now be found in the care  plan section)  Progress towards OT goals: OT to reassess next treatment     Plan      Co-evaluation                 AM-PAC OT 6 Clicks Daily Activity     Outcome Measure   Help from another person eating meals?: A Lot Help from another person taking care of personal grooming?: A Little Help from another person toileting, which includes using toliet, bedpan, or urinal?: Total Help from another person bathing (including washing, rinsing, drying)?: Total Help from another person to put on and taking off regular upper body clothing?: A Lot Help from another person to put on and taking off regular lower body clothing?: Total 6 Click Score: 10    End of Session    OT Visit Diagnosis: Unsteadiness on feet (R26.81);Other abnormalities of gait and mobility (R26.89);Muscle weakness (generalized) (M62.81);Feeding difficulties (R63.3);Other symptoms and signs involving cognitive function;Cognitive communication deficit (R41.841);Pain Symptoms and signs involving cognitive functions: Other cerebrovascular disease Pain - part of body:  (  ABD)   Activity Tolerance Treatment limited secondary to agitation   Patient Left in bed;with call bell/phone within reach;with bed alarm set   Nurse Communication Mobility status        Time: 8783-8756 OT Time Calculation (min): 27 min  Charges: OT General Charges $OT Visit: 1 Visit OT Treatments $Self Care/Home Management : 8-22 mins $Therapeutic Activity: 8-22 mins    Jacques Karna Loose 06/21/2024, 2:03 PM

## 2024-06-22 DIAGNOSIS — E876 Hypokalemia: Secondary | ICD-10-CM | POA: Insufficient documentation

## 2024-06-22 DIAGNOSIS — R109 Unspecified abdominal pain: Secondary | ICD-10-CM | POA: Diagnosis not present

## 2024-06-22 LAB — GLUCOSE, CAPILLARY
Glucose-Capillary: 101 mg/dL — ABNORMAL HIGH (ref 70–99)
Glucose-Capillary: 107 mg/dL — ABNORMAL HIGH (ref 70–99)
Glucose-Capillary: 121 mg/dL — ABNORMAL HIGH (ref 70–99)
Glucose-Capillary: 95 mg/dL (ref 70–99)
Glucose-Capillary: 96 mg/dL (ref 70–99)

## 2024-06-22 MED ORDER — TRAVASOL 10 % IV SOLN
INTRAVENOUS | Status: AC
  Filled 2024-06-22: qty 864

## 2024-06-22 NOTE — Progress Notes (Signed)
 PHARMACY - TOTAL PARENTERAL NUTRITION CONSULT NOTE   Indication: Prolonged ileus  Patient Measurements: Height: 5' 2 (157.5 cm) Weight: 68.7 kg (151 lb 7.3 oz) IBW/kg (Calculated) : 50.1 TPN AdjBW (KG): 61.3 Body mass index is 27.7 kg/m.   Assessment: Presented to ED with N/V/D and abdominal pain x 3d on 05/31/24. CT negative. Possibly from diverticulitis and placed on Cefepime /Flagyl . Concern for SBO, NGT placed. Continues to have persistent pain and distention. No significant intake since 8/21. Pharmacy consulted to start TPN on 8/26. S/p ex lap with small bowel resection on 8/27.    Glucose / Insulin : No h/o DM -CBGs within goal < 150 -1u SSI given in last 24 hrs Electrolytes: Mg 2.6, all others WNL including coCa (9.6) Renal: Scr and BUN WNL Hepatic: LFTs WNL; alb 2.7; TG stable Intake / Output; MIVF:  -LBM 9/3, UOP not documented  GI Imaging: 8/19 CT: Scattered colonic diverticulosis. No active diverticulitis.   8/22, 8/23, 8/25, 8/26 Abd Xray: all SBO  8/26 CT: severely thickened and inflamed central pelvic loops of small bowel concerning for ischemia. Concern for mesenteric volvulus or closed loop obstruction.  9/2 CT: 1. Interval abdominal surgery. There is a small amount of free air in the upper abdomen likely related to recent surgery. 2. New enhancing fluid collections in the right paracolic gutter and central pelvis worrisome for abscesses. 3. Persistent clustered small bowel loops in the pelvis with wall thickening and mesenteric edema. There is a small amount of air within the mesentery at this level which is new from prior. 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 4. Dilated small bowel loops in the upper abdomen worrisome for small bowel obstruction. 5. Diffuse bladder wall thickening with small amount of air in the bladder. Findings may be related to recent instrumentation or infection. 6. Small amount of simple free fluid in the right upper quadrant and left upper  quadrant. 7. Body wall edema. 8. Aortic atherosclerosis.  GI Surgeries / Procedures:  8/27 Ex lap with small bowel resection 9/3 IR drain placement to abd/pelvic fluid collection  Central access: PICC TPN start date: 8/26  Nutritional Goals: Goal TPN rate is 80 mL/hr (provides 86 g of protein and  1759 kcals per day)  RD Assessment: Kcal 1650-1850 Protein 75-90 g Fluid 1.8 L/day  Current Nutrition:  Boost/Breeze 1 container TID Ensure Plus High Protein BID  9/4 Regular diet - not eating much. Surgery deferring to TRH for decision regarding TPN continuation.   9/6 Discussed enteral nutrition options with RN, MD, RD.  Calorie count ordered.  Add magic cup with meals.  9/8 See RN notes 9/7 for meal documentation - breakfast 25%, lunch 10%, dinner 25% (total intake ~ 25% needs) 9/9 Cal count ongoing  Patient with underlying dementia, likely a barrier to her meeting adequate nutritional intake. Megace  added 9/8. Seems to tolerate more food with family involvement.  Plan:  -Continue TPN at goal 80 mL/hr  -Electrolytes in TPN:  Na 50 mEq/L K 40 mEq/L Ca 5 mEq/L Mg 3 mEq/L Phos 15 mmol/L Cl:Ac 1:1 -Add standard MVI and trace elements to TPN -Continue Sensitive q6h SSI and adjust as needed  -Monitor TPN labs on Mon/Thurs, and PRN -Continue to follow for plan of care   Stefano MARLA Bologna, PharmD, BCPS Clinical Pharmacist 06/22/2024 10:33 AM

## 2024-06-22 NOTE — Progress Notes (Signed)
  13 Days Post-Op   Chief Complaint/Subjective: Tolerated more food yesterday evening with family involvement. More confused overnight, no abdominal pain  Objective: Vital signs in last 24 hours: Temp:  [97.8 F (36.6 C)-98.1 F (36.7 C)] 98.1 F (36.7 C) (09/09 0606) Pulse Rate:  [87-102] 87 (09/09 0606) Resp:  [16-18] 18 (09/09 0606) BP: (140-154)/(80-93) 154/80 (09/09 0606) SpO2:  [95 %-100 %] 100 % (09/09 0606) Weight:  [68.7 kg] 68.7 kg (09/09 0606) Last BM Date : 06/16/24 Intake/Output from previous day: 09/08 0701 - 09/09 0700 In: 1375.7 [P.O.:120; I.V.:845.7; IV Piggyback:400] Out: 10 [Drains:10]  PE: Gen: NAD Resp: nonlabored Card: RRR Abd: wound open, small dehiscence in upper wound otherwise healthy granulation tissue.  Lab Results:  Recent Labs    06/20/24 0251 06/21/24 0243  WBC 10.3 11.2*  HGB 8.3* 8.4*  HCT 26.5* 26.0*  PLT 589* 583*   Recent Labs    06/21/24 0243  NA 142  K 4.0  CL 105  CO2 28  GLUCOSE 118*  BUN 18  CREATININE 0.35*  CALCIUM  8.6*   No results for input(s): LABPROT, INR in the last 72 hours.    Component Value Date/Time   NA 142 06/21/2024 0243   NA 144 03/02/2024 1554   K 4.0 06/21/2024 0243   CL 105 06/21/2024 0243   CO2 28 06/21/2024 0243   GLUCOSE 118 (H) 06/21/2024 0243   BUN 18 06/21/2024 0243   BUN 20 03/02/2024 1554   CREATININE 0.35 (L) 06/21/2024 0243   CALCIUM  8.6 (L) 06/21/2024 0243   PROT 5.4 (L) 06/21/2024 0243   PROT 6.1 03/02/2024 1554   ALBUMIN  2.7 (L) 06/21/2024 0243   ALBUMIN  3.9 03/02/2024 1554   AST 33 06/21/2024 0243   ALT 29 06/21/2024 0243   ALKPHOS 113 06/21/2024 0243   BILITOT 0.3 06/21/2024 0243   BILITOT 0.2 03/02/2024 1554   GFRNONAA >60 06/21/2024 0243   GFRAA >60 01/22/2020 0226    Assessment/Plan  s/p Procedure(s): LAPAROSCOPY, DIAGNOSTIC, EXPLORATORY LAPAROTOMY WITH SMALL BOWEL RESECTION 06/09/2024   FEN - reg diet, cal count ongoing VTE - lovenox  ID - no abx  needs Disposition - nutrition slowly improving   LOS: 20 days   I reviewed last 24 h vitals and pain scores, last 48 h intake and output, last 24 h labs and trends, and last 24 h imaging results.  This care required moderate level of medical decision making.   Herlene Righter [facility] 06/22/2024, 9:25 AM Please see Amion for pager number during day hours 7:00am-4:30pm or 7:00am -11:30am on weekends

## 2024-06-22 NOTE — TOC Progression Note (Signed)
 Transition of Care [facility]) - Progression Note    Patient Details  Name: Lindsay Mason MRN: [id number] Date of Birth: 02/18/1951  Transition of [facility] CM/SW Contact  Sheri ONEIDA [place], [place] Phone Number: 06/22/2024, 4:04 PM  Clinical Narrative:    Per DJ from [facility]; pt was offered peer to peer for LTAC. P2P completed and shara was denied.  Expected Discharge Plan: Skilled Nursing Facility Barriers to Discharge: Continued Medical Work up               Expected Discharge Plan and Services In-house Referral: Clinical Social Work Discharge Planning Services: CM Consult Post Acute Care Choice: Skilled Nursing Facility                   DME Arranged: N/A DME Agency: NA                   Social Drivers of Health (SDOH) Interventions SDOH Screenings   Food Insecurity: No Food Insecurity (06/02/2024)  Housing: Low Risk  (06/02/2024)  Transportation Needs: No Transportation Needs (06/02/2024)  Utilities: Not At Risk (06/02/2024)  Alcohol Screen: Low Risk  (05/05/2024)  Depression (PHQ2-9): High Risk (05/07/2024)  Financial Resource Strain: Low Risk  (05/05/2024)  Physical Activity: Sufficiently Active (05/05/2024)  Social Connections: Moderately Isolated (06/02/2024)  Stress: No Stress Concern Present (05/05/2024)  Tobacco Use: Medium Risk (06/09/2024)  Health Literacy: Inadequate Health Literacy (05/05/2024)    Readmission Risk Interventions    06/14/2024    2:25 PM 01/11/2024   10:01 AM  Readmission Risk Prevention Plan  Post Dischage Appt  Complete  Medication Screening  Complete  Transportation Screening Complete Complete  Medication Review Oceanographer) Complete   HRI or Home Care Consult Complete   SW Recovery Care/Counseling Consult Complete   Palliative Care Screening Not Applicable   Skilled Nursing Facility Complete

## 2024-06-22 NOTE — Progress Notes (Signed)
 Patient ID: Lindsay Mason, female   DOB: 03/31/1951, 73 y.o.   MRN: [id number]    Referring Physician(s): White,C  Supervising Physician: Vanice Revel  Patient Status:  [facility] - In-pt  Chief Complaint: Abdominal pain, postop abdominal/pelvic fluid collections; s/p RLQ drain placement 9/3    Subjective: Pt doing ok this am; no acute changes; son in room;minimal output from RLQ drain   Allergies: Donepezil  and Penicillins  Medications: Prior to Admission medications   Medication Sig Start Date End Date Taking? Authorizing Provider  albuterol  (VENTOLIN  HFA) 108 (90 Base) MCG/ACT inhaler Inhale 2 puffs into the lungs every 6 (six) hours as needed for wheezing or shortness of breath. 08/15/22  Yes Icard, Bradley L, DO  atorvastatin  (LIPITOR) 20 MG tablet Take 20 mg by mouth daily.   Yes [provider]  b complex vitamins capsule Take 1 capsule by mouth daily.   Yes [provider]  famotidine  (PEPCID ) 20 MG tablet Take 1 tablet (20 mg total) by mouth daily as needed for heartburn or indigestion. 12/05/23  Yes [name], MD  Multiple Vitamin (MULTIVITAMIN) tablet Take 1 tablet by mouth daily.   Yes [provider]  nortriptyline  (PAMELOR ) 25 MG capsule Take 1 capsule (25 mg total) by mouth at bedtime. 05/07/24  Yes [name], MD  traZODone  (DESYREL ) 50 MG tablet Take 50 mg by mouth at bedtime as needed for sleep. 05/20/24  Yes [provider]  rivastigmine  (EXELON ) 9.5 mg/24hr PLACE 1 PATCH (9.5 MG TOTAL) ONTO THE SKIN DAILY. 06/08/24   [name], MD     Vital Signs: BP (!) 154/80 (BP Location: Left Arm)   Pulse 87   Temp 98.1 F (36.7 C)   Resp 18   Ht 5' 2 (1.575 m)   Wt 151 lb 7.3 oz (68.7 kg)   SpO2 100%   BMI 27.70 kg/m   Physical Exam awake, hx dementia; RLQ drain intact, insertion site ok, mildly tender, minimal  amount serous fluid in JP bulb, unable to aspirate any additional fluid  Imaging: CT ABDOMEN PELVIS  W CONTRAST Result Date: 06/20/2024 CLINICAL DATA:  Follow-up right lower quadrant drain placement EXAM: CT ABDOMEN AND PELVIS WITH CONTRAST TECHNIQUE: Multidetector CT imaging of the abdomen and pelvis was performed using the standard protocol following bolus administration of intravenous contrast. RADIATION DOSE REDUCTION: This exam was performed according to the departmental dose-optimization program which includes automated exposure control, adjustment of the mA and/or kV according to patient size and/or use of iterative reconstruction technique. CONTRAST:  OMNIPAQUE  IOHEXOL  300 MG/ML  SOLN COMPARISON:  None Available. FINDINGS: Lower chest: Ground-glass opacity dependently in the left lower lobe is similar to prior study and likely reflects atelectasis or scarring. No effusions. Hepatobiliary: No focal hepatic abnormality. Gallbladder unremarkable. Pancreas: No focal abnormality or ductal dilatation. Spleen: No focal abnormality.  Normal size. Adrenals/Urinary Tract: Diffuse left adrenal enlargement, stable. No hydronephrosis. Small amount of air within the urinary bladder, presumably related to recent catheterization. Stomach/Bowel: Sigmoid diverticulosis. No active diverticulitis. Stomach and small bowel decompressed, unremarkable. Vascular/Lymphatic: Diffuse aortic atherosclerosis. No evidence of aneurysm or adenopathy. Reproductive: No visible focal abnormality. Other: Right lower quadrant drain in place in the area of previous right paracolic gutter fluid collection. No measurable fluid now seen around the catheter. Inferior in the right lower quadrant is a 2nd fluid collection measuring 4.1 x 2.6 cm. It is unclear if this communicates with the larger drained right paracolic gutter fluid collection, but is persistence suggests  these do not communicate. Central pelvic fluid collection again noted measuring 3.6 x 3.5 cm, decreased in size since prior study. Musculoskeletal: No acute bony abnormality.  Diffuse degenerative disc and facet disease. IMPRESSION: Right paracolic gutter fluid collection has been drain with pigtail drainage catheter in place. No measurable fluid in the region. More inferiorly in the right lower quadrant is a 4.1 x 2.6 cm fluid collection which likely does not communicate with the other fluid collection drained by the drainage catheter. Persistent central pelvic fluid collection measuring 3.6 x 3.5 cm, decreased since prior study. Air within the urinary bladder, presumably from recent instrumentation. If there has not been catheterization recently, this would be concerning for fistula. Aortic atherosclerosis. Sigmoid diverticulosis. Electronically Signed   By: Franky Crease M.D.   On: 06/20/2024 17:20    Labs:  CBC: Recent Labs    06/18/24 0051 06/19/24 0323 06/20/24 0251 06/21/24 0243  WBC 11.7* 10.1 10.3 11.2*  HGB 7.9* 8.1* 8.3* 8.4*  HCT 24.4* 26.0* 26.5* 26.0*  PLT 442* 550* 589* 583*    COAGS: Recent Labs    06/16/24 1112  INR 1.4*    BMP: Recent Labs    06/16/24 0500 06/17/24 0311 06/18/24 0051 06/21/24 0243  NA 139 140 140 142  K 3.8 3.9 3.7 4.0  CL 109 110 108 105  CO2 21* 23 23 28   GLUCOSE 114* 117* 104* 118*  BUN 31* 23 20 18   CALCIUM  8.2* 8.2* 8.1* 8.6*  CREATININE 0.34* 0.36* 0.33* 0.35*  GFRNONAA >60 >60 >60 >60    LIVER FUNCTION TESTS: Recent Labs    06/10/24 0332 06/14/24 0600 06/17/24 0311 06/21/24 0243  BILITOT 0.3 0.3 0.4 0.3  AST 22 33 23 33  ALT 15 20 20 29   ALKPHOS 59 73 101 113  PROT 5.0* 5.3* 4.9* 5.4*  ALBUMIN  3.3* 3.1* 2.5* 2.7*    Assessment and Plan: Pt s/p ex lap with SBR for ischemic bowel 8/27; now with post op abd/pelvic fluid collections, s/p RLQ drain 9/3 (10 fr to JP); afebrile, drain fl cx neg; no new labs;  f/u CT A/P 9/7 revealed:  Right paracolic gutter fluid collection has been drain with pigtail drainage catheter in place. No measurable fluid in the region. More inferiorly in the right  lower quadrant is a 4.1 x 2.6 cm fluid collection which likely does not communicate with the other fluid collection drained by the drainage catheter. Persistent central pelvic fluid collection measuring 3.6 x 3.5 cm, decreased since prior study.   Air within the urinary bladder, presumably from recent instrumentation. If there has not been catheterization recently, this would be concerning for fistula.   Aortic atherosclerosis.   Sigmoid diverticulosis  Images were reviewed by Dr. Vanice; with minimal output from RLQ drain and no fluid in region on CT decision made to remove drain; RLQ drain removed in its entirety without immediate complications; gauze dressing applied to site; no additional intervention planned at this time   Electronically Signed: D. Franky Rakers, PA-C 06/22/2024, 11:35 AM   I spent a total of 15 Minutes at the the patient's bedside AND on the patient's hospital floor or unit, greater than 50% of which was counseling/coordinating care for right lower abdominal drain

## 2024-06-22 NOTE — Progress Notes (Signed)
 Calorie Count Note   48 hour calorie count ordered.   Diet: Regular Supplements:  -Ensure Plus High Protein po BID, each supplement provides 350 kcal and 20 grams of protein.  -Boost Breeze po TID, each supplement provides 250 kcal and 9 grams of protein  -Magic cup TID with meals, each supplement provides 290 kcal and 9 grams of protein    9/8: Breakfast: 440 kcals, 11g protein Lunch: 320 kcals, 12g protein Dinner: 300 kcals, 15g protein Supplements: meals include magic cups. No Ensure or Boost, family reports pt finds them too sweet   Total intake: 1060 kcal (64% of minimum estimated needs)  38g protein (50% of minimum estimated needs)   Patient finds Ensure/Boost too sweet. Recommended adding ice to Boost Breeze and milk to Ensure to lessen sweetness.  Pt eating for family this morning, feels hungry this morning. Pt likes Magic cups.   Nutrition Dx: Inadequate oral intake related to lethargy/confusion (dementia, nausea) as evidenced by meal completion < 50%. Ongoing.    Goal: Pt to meet >/= 90% of their estimated nutrition needs    Intervention:  -Continue Ensure and Boost supplements -mix with milk to lessen sweetness -Continue Magic cups -TPN per Pharmacy -can likely wean if continues to eat with good appetite -Calorie Count -Continue family or staff assistance with meals   Morna Lee, MS, RD, LDN Inpatient Clinical Dietitian Contact via Secure chat

## 2024-06-22 NOTE — Plan of Care (Signed)

## 2024-06-22 NOTE — Progress Notes (Signed)
 Progress Note    Lindsay Mason   FMW:[id number]  DOB: 02-11-1951  DOA: 06/02/2024     20 PCP: [name], MD  Initial CC: Lethargy, confusion  Hospital Course: 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    Patient presented to the hospital with positive blood culture with staph species.  Patient had lethargy and worsening mental status.  In the ED patient had slightly low blood pressure.  White blood cell elevated at 13.  CT abdomen and pelvis done on 06/01/2024 revealed no acute finding.  Patient was admitted to the hospital for further evaluation and treatment.  During hospitalization, patient continued to have abdominal pain and distention.  General surgery was consulted.   Repeat CT scanning of the abdomen and pelvis with contrast done on 06/08/2024 revealed severely thickened and inflamed central pelvic loops of small bowel concerning for ischemia. Complex appearance due to indistinct bowel wall margins, surrounding edema, dilated small bowel extending towards these loops, and at least some sense of twisting of bowel along adjacent branches of the superior mesenteric vein concerning for mesenteric volvulus or closed loop obstruction.   Patient eventually underwent exploratory laparotomy with a small bowel resection 06/09/2024.  Patient is currently on TPN, with very poor oral intake.  Repeat CT scan 06/15/2024 revealed new enhancing fluid collections in the right paracolic gutter and central pelvis worrisome for abscesses. IR consulted for drain placement. Underwent drain placement on 9/03.    Assessment & Plan:   Sepsis secondary to acute diverticulitis Small bowel obstruction/ileus -Patient initially had leukocytosis with hypotension and  lethargy and clinical suspicion for diverticulitis/suggestive of possible sepsis.  Initial CT scan of the abdomen initially was negative for acute findings but had clinical symptoms of diverticulitis.   --Treated initially with  cefepime  and metronidazole  14 days.  Blood cultures negative. -General surgery was consulted due to persistent abdominal distention and pain.  Initial imaging with x-ray of the abdomen showed possible SBO/ileus.  Patient was then started on conservative treatment with NG tube IV fluid with electrolyte replacement but continued to have pain and distention with NG output.  Repeat CT scan done 06/08/2024 showed significant thickening of the pelvic loops of the small bowel concerning for ischemia. - Patient underwent diagnostic laparoscopy, exploratory laparotomy with small bowel resection on 06/09/2024 - remains on TPN until able to tolerate more intake; seems to be improving some esp with being fed - calorie count underway - remains on meropenem  currently; last WBC 9/8 uptrended some (afebrile) but repeat CT 9/7 showing new fluid collection RLQ 4.1 x 2.6 cm and persistent central pelvic collection measuring 3.6 x 3.5 cm - repeat CBC in am to help decide if we trial an abx holiday or if more source control needed given new and persistent fluid collections     Hypophosphatemia - replete as needed   Hypokalemia - Replete as needed   Metabolic Encephalopathy on the background of Alzheimer's dementia.    - Presumed worsening in setting of acute infection; son states encephalopathy worsens when she is sick -Continue delirium precautions  Blood culture contamination - 06/01/2024 culture noted with Staph hominis, presumed contaminant.  No further workup   COPD with emphysema  Continue albuterol  inhaler   Hyperlipidemia PAD (peripheral artery disease)  Thoracic aortic atherosclerosis -Holding Lipitor   History of  essential hypertension On as needed IV hydralazine     Weakness, deconditioning.   - Seen by PT OT and recommended skilled nursing facility placement    Dysphagia; not eating much. Son report she is having issues with coordinating swallowing. Speech consults.  06/18/2024: Speech evaluation input is appreciated.  Please see documentation.     Nutrition Problem: Inadequate oral intake Etiology: lethargy/confusion (dementia, nausea) Signs/Symptoms: meal completion < 50% Interventions: TPN  Estimated body mass index is 28.13 kg/m as calculated from the following:   Height as of this encounter: 5' 2 (1.575 m).   Weight as of this encounter: 69.8 kg.  Interval History:  Son present bedside this morning helping feed patient.  Eating about 50% of her meals over the past couple at least per son.   Old records reviewed in assessment of this patient  Antimicrobials: Meropenem   DVT prophylaxis:  enoxaparin  (LOVENOX ) injection 40 mg Start: 06/16/24 2100 SCDs Start: 06/02/24 1114   Code Status:   Code Status: Full Code  Mobility Assessment (Last 72 Hours)     Mobility Assessment     Row Name 06/22/24 1351 06/21/24 1945 06/21/24 1401 06/21/24 0940 06/21/24 0056   Does the patient have exclusion criteria? No - Perform mobility assessment No - Perform mobility assessment -- -- No - Perform mobility assessment   What is the highest level of mobility based on the mobility assessment? Level 2 (Chairfast) - Balance while sitting on edge of bed and cannot stand Level 2 (Chairfast) - Balance while sitting on edge of bed and cannot stand Level 2 (Chairfast) - Balance while sitting on edge of bed and cannot stand Level 3 (Stands with assistance) - Balance while standing  and cannot march in place Level 1 (Bedfast) - Unable to balance while sitting on edge of bed   Is the above level different from baseline mobility prior to current illness? Yes - Recommend PT order Yes - Recommend PT order -- -- --    Row Name 06/20/24 1817 06/19/24 2019          Does the patient have exclusion criteria? No - Perform mobility assessment No - Perform mobility assessment      What is the highest level of mobility based on the mobility assessment? Level 1 (Bedfast) - Unable to balance while sitting on edge of bed Level 3 (Stands with assistance) - Balance while standing  and cannot march in place      Is the above level different from baseline mobility prior to current illness? -- Yes - Recommend PT order         Barriers to discharge: None Disposition Plan: SNF HH orders placed: N/A Status is: Inpatient  Objective: Blood pressure 134/81, pulse 98, temperature 98.1 F (36.7 C), resp. rate 19, height 5' 2 (1.575 m), weight 68.7 kg, SpO2 100%.  Examination:  Physical Exam Constitutional:      Comments: Pleasant elderly woman lying in bed pleasantly confused  HENT:     Head: Normocephalic and atraumatic.     Mouth/Throat:     Mouth: Mucous membranes are moist.  Eyes:     Extraocular Movements: Extraocular movements intact.  Cardiovascular:     Rate and Rhythm: Normal rate and regular rhythm.  Pulmonary:     Effort: Pulmonary effort is normal. No respiratory distress.     Breath sounds: Normal breath sounds. No wheezing.  Abdominal:     General: Bowel sounds are normal. There is no distension.     Palpations:  Abdomen is soft.     Tenderness: There is no abdominal tenderness.  Musculoskeletal:        General: Normal range of motion.     Cervical back: Normal range of motion and neck supple.  Skin:    General: Skin is warm and dry.  Neurological:     Mental Status: Mental status is at baseline. She is disoriented.      Consultants:  General surgery  Procedures:    Data Reviewed: Results for orders placed or performed during the hospital encounter of 06/02/24 (from the past 24 hours)  Glucose, capillary     Status: Abnormal   Collection Time: 06/21/24  5:25 PM  Result Value Ref Range   Glucose-Capillary 117 (H) 70 - 99 mg/dL   Glucose, capillary     Status: Abnormal   Collection Time: 06/22/24 12:18 AM  Result Value Ref Range   Glucose-Capillary 121 (H) 70 - 99 mg/dL  Glucose, capillary     Status: None   Collection Time: 06/22/24  6:34 AM  Result Value Ref Range   Glucose-Capillary 95 70 - 99 mg/dL  Glucose, capillary     Status: Abnormal   Collection Time: 06/22/24 11:55 AM  Result Value Ref Range   Glucose-Capillary 107 (H) 70 - 99 mg/dL   Comment 1 Notify RN   Glucose, capillary     Status: Abnormal   Collection Time: 06/22/24  5:18 PM  Result Value Ref Range   Glucose-Capillary 101 (H) 70 - 99 mg/dL   Comment 1 Notify RN     I have reviewed pertinent nursing notes, vitals, labs, and images as necessary. I have ordered labwork to follow up on as indicated.  I have reviewed the last notes from staff over past 24 hours. I have discussed patient's care plan and test results with nursing staff, CM/SW, and other staff as appropriate.  Time spent: Greater than 50% of the 55 minute visit was spent in counseling/coordination of care for the patient as laid out in the A&P.   LOS: 20 days   Alm Apo, MD [facility] 06/22/2024, 5:22 PM

## 2024-06-23 DIAGNOSIS — A419 Sepsis, unspecified organism: Secondary | ICD-10-CM | POA: Diagnosis not present

## 2024-06-23 DIAGNOSIS — E440 Moderate protein-calorie malnutrition: Secondary | ICD-10-CM | POA: Insufficient documentation

## 2024-06-23 DIAGNOSIS — Z9049 Acquired absence of other specified parts of digestive tract: Secondary | ICD-10-CM

## 2024-06-23 LAB — CBC WITH DIFFERENTIAL/PLATELET
Abs Immature Granulocytes: 0.15 K/uL — ABNORMAL HIGH (ref 0.00–0.07)
Basophils Absolute: 0.1 K/uL (ref 0.0–0.1)
Basophils Relative: 1 %
Eosinophils Absolute: 0.3 K/uL (ref 0.0–0.5)
Eosinophils Relative: 3 %
HCT: 27.8 % — ABNORMAL LOW (ref 36.0–46.0)
Hemoglobin: 8.8 g/dL — ABNORMAL LOW (ref 12.0–15.0)
Immature Granulocytes: 1 %
Lymphocytes Relative: 15 %
Lymphs Abs: 1.6 K/uL (ref 0.7–4.0)
MCH: 28.7 pg (ref 26.0–34.0)
MCHC: 31.7 g/dL (ref 30.0–36.0)
MCV: 90.6 fL (ref 80.0–100.0)
Monocytes Absolute: 0.6 K/uL (ref 0.1–1.0)
Monocytes Relative: 6 %
Neutro Abs: 8.2 K/uL — ABNORMAL HIGH (ref 1.7–7.7)
Neutrophils Relative %: 74 %
Platelets: 605 K/uL — ABNORMAL HIGH (ref 150–400)
RBC: 3.07 MIL/uL — ABNORMAL LOW (ref 3.87–5.11)
RDW: 15.5 % (ref 11.5–15.5)
WBC: 10.9 K/uL — ABNORMAL HIGH (ref 4.0–10.5)
nRBC: 0 % (ref 0.0–0.2)

## 2024-06-23 LAB — GLUCOSE, CAPILLARY
Glucose-Capillary: 101 mg/dL — ABNORMAL HIGH (ref 70–99)
Glucose-Capillary: 104 mg/dL — ABNORMAL HIGH (ref 70–99)
Glucose-Capillary: 128 mg/dL — ABNORMAL HIGH (ref 70–99)
Glucose-Capillary: 77 mg/dL (ref 70–99)

## 2024-06-23 MED ORDER — TRAVASOL 10 % IV SOLN
INTRAVENOUS | Status: AC
  Filled 2024-06-23: qty 864

## 2024-06-23 NOTE — Progress Notes (Addendum)
 14 Days Post-Op  Subjective: CC: Seen with TRH. Her son is at bedside.  She had some pain in her sides earlier this AM but no current abdominal pain. Reported to be eating 1/3-1/2 of her meals. No n/v. She thinks she is passing flatus. She is not sure when her last BM was.   Objective: Vital signs in last 24 hours: Temp:  [97.7 F (36.5 C)-98.6 F (37 C)] 97.7 F (36.5 C) (09/10 0506) Pulse Rate:  [97-98] 97 (09/10 0506) Resp:  [17-19] 17 (09/10 0506) BP: (134-141)/(81-92) 138/84 (09/10 0506) SpO2:  [96 %-100 %] 96 % (09/10 0506) Weight:  [69.8 kg] 69.8 kg (09/10 0500) Last BM Date : 06/16/24  Intake/Output from previous day: 09/09 0701 - 09/10 0700 In: 2138.2 [I.V.:1838.2; IV Piggyback:300] Out: 1 [Urine:1] Intake/Output this shift: No intake/output data recorded.  PE: Gen:  Alert, NAD, pleasant Pulm:  Rate and effort normal Abd: Soft, ND, NT, laparoscopic incisions cdi with staples in place. Midline incision as below with area of fascial dehiscence and bowel that is visible below fibrinous tissue.          Lab Results:  Recent Labs    06/21/24 0243 06/23/24 0337  WBC 11.2* 10.9*  HGB 8.4* 8.8*  HCT 26.0* 27.8*  PLT 583* 605*   BMET Recent Labs    06/21/24 0243  NA 142  K 4.0  CL 105  CO2 28  GLUCOSE 118*  BUN 18  CREATININE 0.35*  CALCIUM  8.6*   PT/INR No results for input(s): LABPROT, INR in the last 72 hours. CMP     Component Value Date/Time   NA 142 06/21/2024 0243   NA 144 03/02/2024 1554   K 4.0 06/21/2024 0243   CL 105 06/21/2024 0243   CO2 28 06/21/2024 0243   GLUCOSE 118 (H) 06/21/2024 0243   BUN 18 06/21/2024 0243   BUN 20 03/02/2024 1554   CREATININE 0.35 (L) 06/21/2024 0243   CALCIUM  8.6 (L) 06/21/2024 0243   PROT 5.4 (L) 06/21/2024 0243   PROT 6.1 03/02/2024 1554   ALBUMIN  2.7 (L) 06/21/2024 0243   ALBUMIN  3.9 03/02/2024 1554   AST 33 06/21/2024 0243   ALT 29 06/21/2024 0243   ALKPHOS 113 06/21/2024 0243    BILITOT 0.3 06/21/2024 0243   BILITOT 0.2 03/02/2024 1554   GFRNONAA >60 06/21/2024 0243   GFRAA >60 01/22/2020 0226   Lipase     Component Value Date/Time   LIPASE 27 05/31/2024 2054    Studies/Results: No results found.  Anti-infectives: Anti-infectives (From admission, onward)    Start     Dose/Rate Route Frequency Ordered Stop   06/17/24 0900  vancomycin  (VANCOCIN ) IVPB 1000 mg/200 mL premix  Status:  Discontinued        1,000 mg 200 mL/hr over 60 Minutes Intravenous Every 24 hours 06/16/24 0839 06/16/24 1103   06/16/24 1600  metroNIDAZOLE  (FLAGYL ) IVPB 500 mg  Status:  Discontinued        500 mg 100 mL/hr over 60 Minutes Intravenous Every 12 hours 06/16/24 0840 06/16/24 1104   06/16/24 1500  ceFEPIme  (MAXIPIME ) 2 g in sodium chloride  0.9 % 100 mL IVPB  Status:  Discontinued        2 g 200 mL/hr over 30 Minutes Intravenous Every 12 hours 06/16/24 0840 06/16/24 1103   06/16/24 1400  meropenem  (MERREM ) 1 g in sodium chloride  0.9 % 100 mL IVPB  Status:  Discontinued        1  g 200 mL/hr over 30 Minutes Intravenous Every 8 hours 06/16/24 1105 06/23/24 1005   06/16/24 0815  vancomycin  (VANCOREADY) IVPB 1500 mg/300 mL        1,500 mg 150 mL/hr over 120 Minutes Intravenous  Once 06/16/24 0723 06/16/24 1608   06/10/24 0300  ceFEPIme  (MAXIPIME ) 2 g in sodium chloride  0.9 % 100 mL IVPB  Status:  Discontinued        2 g 200 mL/hr over 30 Minutes Intravenous Every 12 hours 06/09/24 1732 06/16/24 0840   06/09/24 2100  metroNIDAZOLE  (FLAGYL ) IVPB 500 mg  Status:  Discontinued        500 mg 100 mL/hr over 60 Minutes Intravenous Every 12 hours 06/09/24 1725 06/16/24 0840   06/09/24 1730  ceFEPIme  (MAXIPIME ) 1 g in sodium chloride  0.9 % 100 mL IVPB  Status:  Discontinued        1 g 200 mL/hr over 30 Minutes Intravenous Every 12 hours 06/09/24 1725 06/09/24 1731   06/09/24 1500  ceFEPIme  (MAXIPIME ) 2 g in sodium chloride  0.9 % 100 mL IVPB        2 g 200 mL/hr over 30 Minutes Intravenous   Once 06/09/24 1454 06/09/24 1743   06/03/24 1130  vancomycin  (VANCOCIN ) IVPB 1000 mg/200 mL premix  Status:  Discontinued        1,000 mg 200 mL/hr over 60 Minutes Intravenous Every 24 hours 06/02/24 1149 06/03/24 0949   06/03/24 0000  ceFEPIme  (MAXIPIME ) 2 g in sodium chloride  0.9 % 100 mL IVPB        2 g 200 mL/hr over 30 Minutes Intravenous Every 12 hours 06/02/24 1149 06/08/24 1553   06/02/24 2200  metroNIDAZOLE  (FLAGYL ) IVPB 500 mg  Status:  Discontinued        500 mg 100 mL/hr over 60 Minutes Intravenous Every 12 hours 06/02/24 1113 06/09/24 1730   06/02/24 1100  ceFEPIme  (MAXIPIME ) 2 g in sodium chloride  0.9 % 100 mL IVPB        2 g 200 mL/hr over 30 Minutes Intravenous  Once 06/02/24 1045 06/02/24 1449   06/02/24 1100  vancomycin  (VANCOCIN ) IVPB 1000 mg/200 mL premix        1,000 mg 200 mL/hr over 60 Minutes Intravenous  Once 06/02/24 1045 06/02/24 1233   06/02/24 1100  metroNIDAZOLE  (FLAGYL ) IVPB 500 mg        500 mg 100 mL/hr over 60 Minutes Intravenous  Once 06/02/24 1045 06/02/24 1818        Assessment/Plan POD 14 s/p diagnostic laparoscopy, ex lap, SBR by Dr. Curvin on 06/09/24 - S/p IR drain 9/3. Cx NGTD. Repeat CT scan 9/7 w/ Right paracolic gutter fluid collection drained after IR drain, 4.1 x 2.6 RLQ fluid collection and 3.6 x 3.5 cm pelvic fluid collection. Reviewed with IR 9/9. With IR drain cx NGTD they did not recommend further drainage or drain manipulation. IR drain removed by their team 9/9 - Okay to stop abx from our standpoint. Monitor vitals and wbc  - Okay for regular diet. On Megace  and undergoing calorie count. Remains on TPN - Reviewed midline wound with attending. No plans for OR. She is currently POD 14. Add mepitel to base of midline wound - BID WTD over. Add abdominal binder.  - Remove staples from laparoscopic sites - Mobilize. PT. Recommending SNF - Pulm toilet  - I discussed with family at bedside.   FEN - Reg diet, cal count, wean TPN if taking  in enough PO, IVF per  primary VTE - SCDs, Lovenox  ID - Merrem  9/3 - 9/10 most recently. None currently.  Foley - None   LOS: 21 days    Lindsay Mason Shaper, [facility] 06/23/2024, 10:27 AM Please see Amion for pager number during day hours 7:00am-4:30pm

## 2024-06-23 NOTE — Plan of Care (Signed)

## 2024-06-23 NOTE — TOC Progression Note (Addendum)
 Transition of Care [facility]) - Progression Note   Patient Details  Name: Lindsay Mason MRN: [id number] Date of Birth: [birth year]  Transition of Care [facility]) CM/SW Contact  Duwaine GORMAN Aran, LCSW Phone Number: 06/23/2024, 10:40 AM  Clinical Narrative: Patient remains on TPN. As LTACH was declined by insurance, patient will need to be faxed out again for SNF when closer to being medically ready as previous bed offers were made last week. Care management to follow.  Addendum: CSW spoke with daughter, [name], regarding SNF. CSW explained that as patient is still on TPN and is not being weaned off of it yet, patient will be faxed out again for SNF when she is closer to medical readiness. Daughter reported she is aware that patient's [place] Medicare is only in-network with limited facilities, but specifically requested the referral not be re-sent to [facility] and [place] as it is too far for the patient's spouse to travel for visiting.  Expected Discharge Plan: Skilled Nursing Facility Barriers to Discharge: Continued Medical Work up  Expected Discharge Plan and Services In-house Referral: Clinical Social Work Discharge Planning Services: CM Consult Post Acute Care Choice: Skilled Nursing Facility             DME Arranged: N/A DME Agency: NA  Social Drivers of Health (SDOH) Interventions SDOH Screenings   Food Insecurity: No Food Insecurity (06/02/2024)  Housing: Low Risk  (06/02/2024)  Transportation Needs: No Transportation Needs (06/02/2024)  Utilities: Not At Risk (06/02/2024)  Alcohol Screen: Low Risk  (05/05/2024)  Depression (PHQ2-9): High Risk (05/07/2024)  Financial Resource Strain: Low Risk  (05/05/2024)  Physical Activity: Sufficiently Active (05/05/2024)  Social Connections: Moderately Isolated (06/02/2024)  Stress: No Stress Concern Present (05/05/2024)  Tobacco Use: Medium Risk (06/09/2024)  Health Literacy: Inadequate Health Literacy (05/05/2024)   Readmission Risk  Interventions    06/14/2024    2:25 PM 01/11/2024   10:01 AM  Readmission Risk Prevention Plan  Post Dischage Appt  Complete  Medication Screening  Complete  Transportation Screening Complete Complete  Medication Review Oceanographer) Complete   HRI or Home Care Consult Complete   SW Recovery Care/Counseling Consult Complete   Palliative Care Screening Not Applicable   Skilled Nursing Facility Complete

## 2024-06-23 NOTE — Progress Notes (Signed)
 PHARMACY - TOTAL PARENTERAL NUTRITION CONSULT NOTE   Indication: Prolonged ileus  Patient Measurements: Height: 5' 2 (157.5 cm) Weight: 69.8 kg (153 lb 14.1 oz) IBW/kg (Calculated) : 50.1 TPN AdjBW (KG): 61.3 Body mass index is 28.15 kg/m.   Assessment: Presented to ED with N/V/D and abdominal pain x 3d on 05/31/24. CT negative. Possibly from diverticulitis and placed on Cefepime /Flagyl . Concern for SBO, NGT placed. Continues to have persistent pain and distention. No significant intake since 8/21. Pharmacy consulted to start TPN on 8/26. S/p ex lap with small bowel resection on 8/27.    No labs ordered for 9/10, follow up 9/11 labs Glucose / Insulin : No h/o DM -CBGs within goal < 150 -No SSI given in last 24 hrs Electrolytes: Mg 2.6, all others WNL including coCa (9.6) Renal: Scr and BUN WNL Hepatic: LFTs WNL; alb 2.7; TG stable Intake / Output; MIVF:  -LBM 9/3, UOP not documented  GI Imaging: 8/19 CT: Scattered colonic diverticulosis. No active diverticulitis.   8/22, 8/23, 8/25, 8/26 Abd Xray: all SBO  8/26 CT: severely thickened and inflamed central pelvic loops of small bowel concerning for ischemia. Concern for mesenteric volvulus or closed loop obstruction.  9/2 CT: 1. Interval abdominal surgery. There is a small amount of free air in the upper abdomen likely related to recent surgery. 2. New enhancing fluid collections in the right paracolic gutter and central pelvis worrisome for abscesses. 3. Persistent clustered small bowel loops in the pelvis with wall thickening and mesenteric edema. There is a small amount of air within the mesentery at this level which is new from prior. 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 4. Dilated small bowel loops in the upper abdomen worrisome for small bowel obstruction. 5. Diffuse bladder wall thickening with small amount of air in the bladder. Findings may be related to recent instrumentation or infection. 6. Small amount of simple free  fluid in the right upper quadrant and left upper quadrant. 7. Body wall edema. 8. Aortic atherosclerosis.  GI Surgeries / Procedures:  8/27 Ex lap with small bowel resection 9/3 IR drain placement to abd/pelvic fluid collection  Central access: PICC TPN start date: 8/26  Nutritional Goals: Goal TPN rate is 80 mL/hr (provides 86 g of protein and  1759 kcals per day)  RD Assessment: Kcal 1650-1850 Protein 75-90 g Fluid 1.8 L/day  Current Nutrition:  Boost/Breeze 1 container TID Ensure Plus High Protein BID  9/4 Regular diet - not eating much. Surgery deferring to TRH for decision regarding TPN continuation.   9/6 Discussed enteral nutrition options with RN, MD, RD.  Calorie count ordered.  Add magic cup with meals.  9/8 See RN notes 9/7 for meal documentation - breakfast 25%, lunch 10%, dinner 25% (total intake ~ 25% needs) 9/9 Cal count ongoing - ate 64% of kcal needs and 50% of protein needs on 9/8 per RD note 9/10: await RD note before possibly starting to wean TPN  Patient with underlying dementia, likely a barrier to her meeting adequate nutritional intake. Megace  added 9/8. Seems to tolerate more food with family involvement.  Plan:  -Continue TPN at goal 80 mL/hr  -Electrolytes in TPN:  Na 50 mEq/L K 40 mEq/L Ca 5 mEq/L Mg 3 mEq/L Phos 15 mmol/L Cl:Ac 1:1 -Add standard MVI and trace elements to TPN -Continue Sensitive q6h SSI and adjust as needed  -Monitor TPN labs on Mon/Thurs, and PRN -Continue to follow for plan of care   Britta Eva Na, PharmD, BCPS Clinical  Pharmacist 06/23/2024 9:10 AM

## 2024-06-23 NOTE — Progress Notes (Signed)
 Nutrition Follow-up  DOCUMENTATION CODES:   Non-severe (moderate) malnutrition in context of chronic illness  INTERVENTION:   -TPN management per Pharmacy -recommend wean to 1/2 rate as PO improved -Daily weights while on TPN  -D/c Calorie count  -Ensure Plus High Protein po BID, each supplement provides 350 kcal and 20 grams of protein. 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  Ongoing.  GOAL:   Patient will meet greater than or equal to 90% of their needs  Meeting with TPN  MONITOR:   PO intake, Supplement acceptance  ASSESSMENT:   74 y.o. 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 Currently being treated for small bowel obstruction/ileus with NG tube and IV fluids.  8/20: admitted  8/24: NGT placed for suction 8/26: TPN initiated via PICC 8/27: s/p ex lap, SB resection, TPN to goal 8/30: CLD 8/31: NGT removed 9/1: FLD 9/2: Soft diet  9/4 - diet advanced to Regular  Patient in room, son at bedside. Pt continues to eat better with meals. Ate most of her french toast, 2 pieces of bacon with 1/2 Ensure as well (~495 kcals, 22g protein). Main concern from family , pt has not had a BM for a few days.   9/9 calorie count results: B: 420 kcals, 13g protein L: 270 kcals, 11g protein D: not documented and son was  not present for this meal Supplements: no Ensure or Boost, 1/2 Magic cup = 145 kcals, 4g protein Total: 835 kcals (50% of needs), 28g protein (37% of needs)  Nothing documented in envelope on door. Pt eating better than previous day already. Will d/c calorie count.  TPN at 80 ml/hr, providing 1759 kcals and 86g protein.  Admission weight: 135 lbs Current weight: 153 lbs  Medications: Megace   Labs reviewed: CBGs: 77-104  Diet Order:   Diet Order             Diet regular Room service appropriate? Yes; Fluid consistency: Thin  Diet effective now                   EDUCATION NEEDS:   Education needs have been addressed  Skin:  Skin Assessment: Skin Integrity Issues: Skin Integrity Issues:: Incisions Incisions: 8/27 abdomen  Last BM:  9/8  Height:   Ht Readings from Last 1 Encounters:  06/02/24 5' 2 (1.575 m)    Weight:   Wt Readings from Last 1 Encounters:  06/23/24 69.8 kg    BMI:  Body mass index is 28.15 kg/m.  Estimated Nutritional Needs:   Kcal:  1650-1850  Protein:  75-90g  Fluid:  1.8L/day   Morna Lee, MS, RD, LDN Inpatient Clinical Dietitian Contact via Secure chat

## 2024-06-23 NOTE — Progress Notes (Signed)
 Occupational Therapy Treatment Patient Details Name: Lindsay Mason MRN: [id number] DOB: Mar 10, 1951 Today's Date: 06/23/2024   History of present illness Pt is 73 yo female presented on 06/02/24  with + blood culture with staph species, lethargy, and worsening mental status. Pt admitted with SIRS, abdominal pain, N/V likely secondary to acute diverticulitis, and SBO vs ileus.  Pt with NG tube and surgery following. s/p exploratory lap with SB resection 2* ischemic bowel 06/09/24.   Pt also with metabolic encephalopathy on background of Alzheimer's dementia.  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 Pt get to EOB with CGA, poor overall safety awareness, significant confusion, not able to follow instructions consistently. Pt declined need for RW for getting to recliner. Pt almost fell backwards at one point, hand on gait belt and min A for stability, continued to decline need for assistance. Pt got back to bed with min assist and sat EOB for several minutes, difficulty understanding when asked to lie back down in bed. Pt declined to participate in ADLs, c/o headache.  Pt would benefit from continued acute OT to maximize participation in ADLs, DC plan to postacute rehab <3hrs/day still appropriate. Bed alarm set, all needs met.       If plan is discharge home, recommend the following:  Assistance with feeding;Direct supervision/assist for medications management;Assistance with cooking/housework;Direct supervision/assist for financial management;Assist for transportation;Help with stairs or ramp for entrance;Supervision due to cognitive status;A lot of help with bathing/dressing/bathroom;A lot of help with walking and/or transfers   Equipment  Recommendations  Other (comment) (defer)    Recommendations for Other Services      Precautions / Restrictions Precautions Precautions: Fall Recall of Precautions/Restrictions: Impaired Precaution/Restrictions Comments: abdominal sx, dementia Restrictions Weight Bearing Restrictions Per Provider Order: No       Mobility Bed Mobility Overal bed mobility: Needs Assistance Bed Mobility: Supine to Sit, Sit to Supine     Supine to sit: Contact guard, HOB elevated, Used rails Sit to supine: Min assist, HOB elevated, Used rails   General bed mobility comments: CGA/min A for max verbal cues to participate. fair overall strength to complete bed mobility    Transfers Overall transfer level: Needs assistance Equipment used: 1 person hand held assist Transfers: Sit to/from Stand, Bed to chair/wheelchair/BSC Sit to Stand: Min assist     Step pivot transfers: Min assist     General transfer comment: min A with HHA, poor balance, not aware of deficits, refuses RW for support, almost fell backwards at one point, OT support required to maintain balance.     Balance Overall balance assessment: Needs assistance Sitting-balance support: No upper extremity supported, Feet supported Sitting balance-Leahy Scale: Fair     Standing balance support: Single extremity supported, During functional activity, Reliant on assistive device for balance Standing balance-Leahy Scale: Poor Standing balance comment: refused use of RW, use HHAx1, Pt almost fell back at one point requirining support. Poor safety awareness                           ADL either performed or assessed with clinical judgement   ADL Overall ADL's : Needs assistance/impaired  Toilet Transfer: Minimal assistance;BSC/3in1;Cueing for safety;Cueing for sequencing             General ADL Comments: min A for transfer to recliner, poor safety awareness, not able to follow commands  for ADLs.    Extremity/Trunk Assessment Upper Extremity Assessment Upper Extremity Assessment: Generalized weakness            Vision       Perception     Praxis     Communication Communication Communication: Impaired Factors Affecting Communication: Difficulty expressing self   Cognition Arousal: Alert Behavior During Therapy: Anxious, Restless Cognition: History of cognitive impairments             OT - Cognition Comments: extremely confused, difficulty following  commands, poor problem solving, not aware of deficits, oriented to self only.                 Following commands: Impaired Following commands impaired: Follows one step commands inconsistently, Follows one step commands with increased time      Cueing   Cueing Techniques: Verbal cues, Gestural cues, Tactile cues, Visual cues  Exercises      Shoulder Instructions       General Comments      Pertinent Vitals/ Pain       Pain Assessment Pain Assessment: 0-10 Pain Score: 8  Pain Location: headache Pain Descriptors / Indicators: Aching, Grimacing Pain Intervention(s): Monitored during session  Home Living                                          Prior Functioning/Environment              Frequency  Min 2X/week        Progress Toward Goals  OT Goals(current goals can now be found in the care plan section)  Progress towards OT goals: Progressing toward goals  Acute Rehab OT Goals Patient Stated Goal: not able to participate in setting goals OT Goal Formulation: Patient unable to participate in goal setting Time For Goal Achievement: 06/29/24 Potential to Achieve Goals: Fair ADL Goals Pt Will Perform Upper Body Dressing: with min assist;sitting Pt Will Perform Lower Body Dressing: with mod assist;sitting/lateral leans Pt Will Transfer to Toilet: with min assist;bedside commode;stand pivot transfer Pt Will Perform Toileting - Clothing Manipulation and  hygiene: with min assist;sitting/lateral leans  Plan      Co-evaluation                 AM-PAC OT 6 Clicks Daily Activity     Outcome Measure   Help from another person eating meals?: A Lot Help from another person taking care of personal grooming?: A Little Help from another person toileting, which includes using toliet, bedpan, or urinal?: Total Help from another person bathing (including washing, rinsing, drying)?: Total Help from another person to put on and taking off regular upper body clothing?: A Lot Help from another person to put on and taking off regular lower body clothing?: Total 6 Click Score: 10    End of Session Equipment Utilized During Treatment: Gait belt  OT Visit Diagnosis: Unsteadiness on feet (R26.81);Other abnormalities of gait and mobility (R26.89);Muscle weakness (generalized) (M62.81);Feeding difficulties (R63.3);Other symptoms and signs involving cognitive function;Cognitive communication deficit (R41.841);Pain Symptoms and signs involving cognitive functions: Other cerebrovascular disease Pain - Right/Left: Left Pain - part of body: Arm   Activity Tolerance Patient limited  by pain   Patient Left in bed;with call bell/phone within reach;with bed alarm set   Nurse Communication Mobility status        Time: 8484-8460 OT Time Calculation (min): 24 min  Charges: OT General Charges $OT Visit: 1 Visit OT Treatments $Self Care/Home Management : 8-22 mins $Therapeutic Activity: 8-22 mins  Norlene Lanes, OTR/L   Lindsay Mason 06/23/2024, 3:48 PM

## 2024-06-23 NOTE — Progress Notes (Addendum)
 Progress Note    FAVOR KREH   FMW:[id number]  DOB: 09/05/1951  DOA: 06/02/2024     21 PCP: [name], MD  Initial CC: Lethargy, confusion  Hospital Course: 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    Patient presented to the hospital with positive blood culture with staph species.  Patient had lethargy and worsening mental status.  In the ED patient had slightly low blood pressure.  White blood cell elevated at 13.  CT abdomen and pelvis done on 06/01/2024 revealed no acute finding.  Patient was admitted to the hospital for further evaluation and treatment.  During hospitalization, patient continued to have abdominal pain and distention.  General surgery was consulted.   Repeat CT scanning of the abdomen and pelvis with contrast done on 06/08/2024 revealed severely thickened and inflamed central pelvic loops of small bowel concerning for ischemia. Complex appearance due to indistinct bowel wall margins, surrounding edema, dilated small bowel extending towards these loops, and at least some sense of twisting of bowel along adjacent branches of the superior mesenteric vein concerning for mesenteric volvulus or closed loop obstruction.   Patient eventually underwent exploratory laparotomy with a small bowel resection 06/09/2024.  Patient is currently on TPN, with very poor oral intake.  Repeat CT scan 06/15/2024 revealed new enhancing fluid collections in the right paracolic gutter and central pelvis worrisome for abscesses. IR consulted for drain placement. Underwent drain placement on 9/03.    Assessment & Plan:   Sepsis - resolved  Concern for SB ischemia vs SBO vs volvulus with possible microperforation -Patient initially had leukocytosis with  hypotension and lethargy - repeat CT 8/26 showed: Severely thickened and inflamed central pelvic loops of small bowel concerning for ischemia. Complex appearance due to indistinct bowel wall margins, surrounding edema, dilated small bowel extending towards these loops, and at least some sense of twisting of bowel along adjacent branches of the superior mesenteric vein concerning for mesenteric volvulus or closed loop obstruction. No definite pneumatosis. Small locules of gas along the left inguinal ring and along the inflamed loops of bowel are presumably intraluminal but could conceivably represent contained extraluminal gas from local microperforation. - Followed by surgery and taken to the OR on 06/09/2024 for diagnostic laparoscopy, exploratory laparotomy with small bowel resection --Treated initially with  cefepime  and metronidazole  14 days.  Blood cultures negative. - repeat CT 9/7 showing new fluid collection RLQ 4.1 x 2.6 cm and persistent central pelvic collection measuring 3.6 x 3.5 cm - remains on TPN until able to tolerate more intake; seems to be improving some esp with being fed - calorie count completed; RD rec'd to 1/2 rate TPN. Okay with this trial as well - will d/c meropenem  today, 9/10 and do trial off abx; afebrile and leukocytosis stable for now; continue daily CBC  Hypophosphatemia - replete as needed   Hypokalemia - Replete as needed   Metabolic Encephalopathy on the background of Alzheimer's dementia.    - Presumed worsening in setting of acute infection; son states encephalopathy worsens when she is sick -Continue delirium precautions  Blood culture contamination - 06/01/2024 culture noted with Staph hominis, presumed contaminant.  No further workup   COPD with emphysema  Continue albuterol  inhaler   Hyperlipidemia PAD (peripheral artery disease)  Thoracic aortic atherosclerosis -Holding Lipitor   History  of essential hypertension On as needed IV  hydralazine    Weakness, deconditioning.   - Seen by PT OT and recommended skilled nursing facility placement    Dysphagia  - s/p SLP eval - continue regular diet    Nutrition Problem: Inadequate oral intake Etiology: lethargy/confusion (dementia, nausea) Signs/Symptoms: meal completion < 50% Interventions: TPN  Estimated body mass index is 28.13 kg/m as calculated from the following:   Height as of this encounter: 5' 2 (1.575 m).   Weight as of this encounter: 69.8 kg.  Interval History:  Son present bedside this morning.  Discussed plan to hold antibiotics today and monitor fever curve and WBC count. She is starting to eat a little better as time goes, especially with assistance.   Old records reviewed in assessment of this patient  Antimicrobials: Meropenem .  Discontinued 06/23/2024  DVT prophylaxis:  enoxaparin  (LOVENOX ) injection 40 mg Start: 06/16/24 2100 SCDs Start: 06/02/24 1114   Code Status:   Code Status: Full Code  Mobility Assessment (Last 72 Hours)     Mobility Assessment     Row Name 06/23/24 1541 06/23/24 0800 06/22/24 1915 06/22/24 1351 06/21/24 1945   Does the patient have exclusion criteria? -- No - Perform mobility assessment No - Perform mobility assessment No - Perform mobility assessment No - Perform mobility assessment   What is the highest level of mobility based on the mobility assessment? Level 2 (Chairfast) - Balance while sitting on edge of bed and cannot stand Level 2 (Chairfast) - Balance while sitting on edge of bed and cannot stand Level 2 (Chairfast) - Balance while sitting on edge of bed and cannot stand Level 2 (Chairfast) - Balance while sitting on edge of bed and cannot stand Level 2 (Chairfast) - Balance while sitting on edge of bed and cannot stand   Is the above level different from baseline mobility prior to current illness? -- Yes - Recommend PT order Yes - Recommend PT order Yes - Recommend PT order Yes - Recommend PT order     Row Name 06/21/24 1401 06/21/24 0940 06/21/24 0056 06/20/24 1817     Does the patient have exclusion criteria? -- -- No - Perform mobility assessment No - Perform mobility assessment    What is the highest level of mobility based on the mobility assessment? Level 2 (Chairfast) - Balance while sitting on edge of bed and cannot stand Level 3 (Stands with assistance) - Balance while standing  and cannot march in place Level 1 (Bedfast) - Unable to balance while sitting on edge of bed Level 1 (Bedfast) - Unable to balance while sitting on edge of bed       Barriers to discharge: None Disposition Plan: SNF HH orders placed: N/A Status is: Inpatient  Objective: Blood pressure 109/83, pulse (!) 103, temperature 98.2 F (36.8 C), resp. rate 18, height 5' 2 (1.575 m), weight 69.8 kg, SpO2 97%.  Examination:  Physical Exam Constitutional:      Comments: Pleasant elderly woman lying in bed pleasantly confused  HENT:     Head: Normocephalic and atraumatic.     Mouth/Throat:     Mouth: Mucous membranes are moist.  Eyes:     Extraocular Movements: Extraocular movements intact.  Cardiovascular:     Rate and Rhythm: Normal rate and regular rhythm.  Pulmonary:     Effort: Pulmonary effort is normal. No respiratory distress.     Breath sounds: Normal breath sounds. No wheezing.  Abdominal:     General: Bowel  sounds are normal. There is no distension.     Palpations: Abdomen is soft.     Tenderness: There is no abdominal tenderness.     Comments: Midline abdominal incision noted with surgical dressing in place.  Wound is open with secondary intention  Musculoskeletal:        General: Normal range of motion.     Cervical back: Normal range of motion and neck supple.  Skin:    General: Skin is warm and dry.  Neurological:     Mental Status: Mental status is at baseline. She is disoriented.      Consultants:  General surgery  Procedures:  06/09/2024: Diagnostic laparoscopy, exploratory  laparotomy with small bowel resection  Data Reviewed: Results for orders placed or performed during the hospital encounter of 06/02/24 (from the past 24 hours)  Glucose, capillary     Status: Abnormal   Collection Time: 06/22/24  5:18 PM  Result Value Ref Range   Glucose-Capillary 101 (H) 70 - 99 mg/dL   Comment 1 Notify RN   Glucose, capillary     Status: None   Collection Time: 06/22/24 11:47 PM  Result Value Ref Range   Glucose-Capillary 96 70 - 99 mg/dL  CBC with Differential/Platelet     Status: Abnormal   Collection Time: 06/23/24  3:37 AM  Result Value Ref Range   WBC 10.9 (H) 4.0 - 10.5 K/uL   RBC 3.07 (L) 3.87 - 5.11 MIL/uL   Hemoglobin 8.8 (L) 12.0 - 15.0 g/dL   HCT 72.1 (L) 63.9 - 53.9 %   MCV 90.6 80.0 - 100.0 fL   MCH 28.7 26.0 - 34.0 pg   MCHC 31.7 30.0 - 36.0 g/dL   RDW 84.4 88.4 - 84.4 %   Platelets 605 (H) 150 - 400 K/uL   nRBC 0.0 0.0 - 0.2 %   Neutrophils Relative % 74 %   Neutro Abs 8.2 (H) 1.7 - 7.7 K/uL   Lymphocytes Relative 15 %   Lymphs Abs 1.6 0.7 - 4.0 K/uL   Monocytes Relative 6 %   Monocytes Absolute 0.6 0.1 - 1.0 K/uL   Eosinophils Relative 3 %   Eosinophils Absolute 0.3 0.0 - 0.5 K/uL   Basophils Relative 1 %   Basophils Absolute 0.1 0.0 - 0.1 K/uL   Immature Granulocytes 1 %   Abs Immature Granulocytes 0.15 (H) 0.00 - 0.07 K/uL  Glucose, capillary     Status: Abnormal   Collection Time: 06/23/24  5:09 AM  Result Value Ref Range   Glucose-Capillary 104 (H) 70 - 99 mg/dL  Glucose, capillary     Status: None   Collection Time: 06/23/24 11:32 AM  Result Value Ref Range   Glucose-Capillary 77 70 - 99 mg/dL    I have reviewed pertinent nursing notes, vitals, labs, and images as necessary. I have ordered labwork to follow up on as indicated.  I have reviewed the last notes from staff over past 24 hours. I have discussed patient's care plan and test results with nursing staff, CM/SW, and other staff as appropriate.  Time spent: Greater than  50% of the 55 minute visit was spent in counseling/coordination of care for the patient as laid out in the A&P.   LOS: 21 days   Alm Apo, MD [facility] 06/23/2024, 5:01 PM

## 2024-06-24 DIAGNOSIS — Z9049 Acquired absence of other specified parts of digestive tract: Secondary | ICD-10-CM | POA: Diagnosis not present

## 2024-06-24 DIAGNOSIS — A419 Sepsis, unspecified organism: Secondary | ICD-10-CM | POA: Diagnosis not present

## 2024-06-24 LAB — COMPREHENSIVE METABOLIC PANEL WITH GFR
ALT: 30 U/L (ref 0–44)
AST: 27 U/L (ref 15–41)
Albumin: 3.2 g/dL — ABNORMAL LOW (ref 3.5–5.0)
Alkaline Phosphatase: 119 U/L (ref 38–126)
Anion gap: 12 (ref 5–15)
BUN: 26 mg/dL — ABNORMAL HIGH (ref 8–23)
CO2: 23 mmol/L (ref 22–32)
Calcium: 9.3 mg/dL (ref 8.9–10.3)
Chloride: 105 mmol/L (ref 98–111)
Creatinine, Ser: 0.42 mg/dL — ABNORMAL LOW (ref 0.44–1.00)
GFR, Estimated: >60 mL/min (ref >60)
Glucose, Bld: 101 mg/dL — ABNORMAL HIGH (ref 70–99)
Potassium: 4.2 mmol/L (ref 3.5–5.1)
Sodium: 140 mmol/L (ref 135–145)
Total Bilirubin: 0.3 mg/dL (ref 0.0–1.2)
Total Protein: 6.1 g/dL — ABNORMAL LOW (ref 6.5–8.1)

## 2024-06-24 LAB — CBC WITH DIFFERENTIAL/PLATELET
Abs Immature Granulocytes: 0.12 K/uL — ABNORMAL HIGH (ref 0.00–0.07)
Basophils Absolute: 0.1 K/uL (ref 0.0–0.1)
Basophils Relative: 1 %
Eosinophils Absolute: 0.3 K/uL (ref 0.0–0.5)
Eosinophils Relative: 4 %
HCT: 28.8 % — ABNORMAL LOW (ref 36.0–46.0)
Hemoglobin: 9 g/dL — ABNORMAL LOW (ref 12.0–15.0)
Immature Granulocytes: 2 %
Lymphocytes Relative: 25 %
Lymphs Abs: 2 K/uL (ref 0.7–4.0)
MCH: 28.3 pg (ref 26.0–34.0)
MCHC: 31.3 g/dL (ref 30.0–36.0)
MCV: 90.6 fL (ref 80.0–100.0)
Monocytes Absolute: 0.5 K/uL (ref 0.1–1.0)
Monocytes Relative: 6 %
Neutro Abs: 5.2 K/uL (ref 1.7–7.7)
Neutrophils Relative %: 62 %
Platelets: 644 K/uL — ABNORMAL HIGH (ref 150–400)
RBC: 3.18 MIL/uL — ABNORMAL LOW (ref 3.87–5.11)
RDW: 15.7 % — ABNORMAL HIGH (ref 11.5–15.5)
WBC: 8.2 K/uL (ref 4.0–10.5)
nRBC: 0 % (ref 0.0–0.2)

## 2024-06-24 LAB — MAGNESIUM: Magnesium: 2.5 mg/dL — ABNORMAL HIGH (ref 1.7–2.4)

## 2024-06-24 LAB — GLUCOSE, CAPILLARY
Glucose-Capillary: 98 mg/dL (ref 70–99)
Glucose-Capillary: 93 mg/dL (ref 70–99)
Glucose-Capillary: 111 mg/dL — ABNORMAL HIGH (ref 70–99)

## 2024-06-24 LAB — PHOSPHORUS: Phosphorus: 3.5 mg/dL (ref 2.5–4.6)

## 2024-06-24 MED ORDER — POLYETHYLENE GLYCOL 3350 17 G PO PACK
17.0000 g | PACK | Freq: Every day | ORAL | Status: DC
  Administered 2024-06-24 – 2024-07-08 (×10): 17 g via ORAL
  Filled 2024-06-24 (×12): qty 1

## 2024-06-24 MED ORDER — TRAVASOL 10 % IV SOLN
INTRAVENOUS | Status: AC
  Filled 2024-06-24: qty 432

## 2024-06-24 MED ORDER — OXYCODONE HCL 5 MG PO TABS
5.0000 mg | ORAL_TABLET | ORAL | Status: DC | PRN
  Administered 2024-06-24 – 2024-07-07 (×12): 5 mg via ORAL
  Filled 2024-06-24 (×13): qty 1

## 2024-06-24 MED ORDER — DOCUSATE SODIUM 100 MG PO CAPS
100.0000 mg | ORAL_CAPSULE | Freq: Two times a day (BID) | ORAL | Status: DC
  Administered 2024-06-24 – 2024-07-08 (×22): 100 mg via ORAL
  Filled 2024-06-24 (×24): qty 1

## 2024-06-24 MED ORDER — ACETAMINOPHEN 325 MG PO TABS
650.0000 mg | ORAL_TABLET | ORAL | Status: DC | PRN
  Administered 2024-06-27 – 2024-06-28 (×3): 650 mg via ORAL
  Filled 2024-06-24 (×3): qty 2

## 2024-06-24 MED ORDER — MORPHINE SULFATE (PF) 2 MG/ML IV SOLN
2.0000 mg | INTRAVENOUS | Status: DC | PRN
  Administered 2024-06-25: 2 mg via INTRAVENOUS
  Filled 2024-06-24 (×2): qty 1

## 2024-06-24 MED ORDER — NORTRIPTYLINE HCL 25 MG PO CAPS
25.0000 mg | ORAL_CAPSULE | Freq: Every day | ORAL | Status: DC
  Administered 2024-06-24 – 2024-07-07 (×13): 25 mg via ORAL
  Filled 2024-06-24 (×14): qty 1

## 2024-06-24 NOTE — Progress Notes (Signed)
  15 Days Post-Op   Chief Complaint/Subjective: No acute changes  Objective: Vital signs in last 24 hours: Temp:  [97.4 F (36.3 C)-98.5 F (36.9 C)] 98.5 F (36.9 C) (09/11 1248) Pulse Rate:  [85-104] 104 (09/11 1248) Resp:  [12-20] 20 (09/11 1248) BP: (109-145)/(82-89) 130/83 (09/11 1248) SpO2:  [97 %-100 %] 100 % (09/11 1248) Weight:  [70.9 kg] 70.9 kg (09/11 0500) Last BM Date : 06/21/24 Intake/Output from previous day: No intake/output data recorded.  PE: Gen: NAD Resp: nonlabored Card: tachycardic Abd: incision open one area of dehiscence  Lab Results:  Recent Labs    06/23/24 0337 06/24/24 0343  WBC 10.9* 8.2  HGB 8.8* 9.0*  HCT 27.8* 28.8*  PLT 605* 644*   Recent Labs    06/24/24 0343  NA 140  K 4.2  CL 105  CO2 23  GLUCOSE 101*  BUN 26*  CREATININE 0.42*  CALCIUM  9.3   No results for input(s): LABPROT, INR in the last 72 hours.    Component Value Date/Time   NA 140 06/24/2024 0343   NA 144 03/02/2024 1554   K 4.2 06/24/2024 0343   CL 105 06/24/2024 0343   CO2 23 06/24/2024 0343   GLUCOSE 101 (H) 06/24/2024 0343   BUN 26 (H) 06/24/2024 0343   BUN 20 03/02/2024 1554   CREATININE 0.42 (L) 06/24/2024 0343   CALCIUM  9.3 06/24/2024 0343   PROT 6.1 (L) 06/24/2024 0343   PROT 6.1 03/02/2024 1554   ALBUMIN  3.2 (L) 06/24/2024 0343   ALBUMIN  3.9 03/02/2024 1554   AST 27 06/24/2024 0343   ALT 30 06/24/2024 0343   ALKPHOS 119 06/24/2024 0343   BILITOT 0.3 06/24/2024 0343   BILITOT 0.2 03/02/2024 1554   GFRNONAA >60 06/24/2024 0343   GFRAA >60 01/22/2020 0226    Assessment/Plan  s/p Procedure(s): LAPAROSCOPY, DIAGNOSTIC, EXPLORATORY LAPAROTOMY WITH SMALL BOWEL RESECTION 06/09/2024  -bowel regimen  FEN - reg diet VTE - lovenox  ID - no issues Disposition - discharge planning, tolerating diet better last 2 days   LOS: 22 days   I reviewed last 24 h vitals and pain scores, last 48 h intake and output, last 24 h labs and trends, and last  24 h imaging results.  This care required moderate level of medical decision making.   [facility] 06/24/2024, 1:34 PM Please see Amion for pager number during day hours 7:00am-4:30pm or 7:00am -11:30am on weekends

## 2024-06-24 NOTE — Progress Notes (Signed)
 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    Patient presented to the hospital with positive blood culture with staph species.  Patient had lethargy and worsening mental status.  In the ED patient had slightly low blood pressure.  White blood cell elevated at 13.  CT abdomen and pelvis done on 06/01/2024 revealed no acute finding.  Patient was admitted to the hospital for further evaluation and treatment.  During hospitalization, patient continued to have abdominal pain and distention.  General surgery was consulted.   Repeat CT scanning of the abdomen and pelvis with contrast done on 06/08/2024 revealed severely thickened and inflamed central pelvic loops of small bowel concerning for ischemia. Complex appearance due to indistinct bowel wall margins, surrounding edema, dilated small bowel extending towards these loops, and at least some sense of twisting of bowel along adjacent branches of the superior mesenteric vein concerning for mesenteric volvulus or closed loop obstruction.   Patient eventually underwent exploratory laparotomy with a small bowel resection 06/09/2024.  Patient is currently on TPN, with very poor oral intake.  Repeat CT scan 06/15/2024 revealed new enhancing fluid collections in the right paracolic gutter and central pelvis worrisome for abscesses. IR consulted for drain placement. Underwent drain placement on 9/03.    Assessment & Plan:   Sepsis - resolved  Concern for SB ischemia vs SBO vs volvulus with possible microperforation -Patient initially had leukocytosis with  hypotension and lethargy - repeat CT 8/26 showed: Severely thickened and inflamed central pelvic loops of small bowel concerning for ischemia. Complex appearance due to indistinct bowel wall margins, surrounding edema, dilated small bowel extending towards these loops, and at least some sense of twisting of bowel along adjacent branches of the superior mesenteric vein concerning for mesenteric volvulus or closed loop obstruction. No definite pneumatosis. Small locules of gas along the left inguinal ring and along the inflamed loops of bowel are presumably intraluminal but could conceivably represent contained extraluminal gas from local microperforation. - Followed by surgery and taken to the OR on 06/09/2024 for diagnostic laparoscopy, exploratory laparotomy with small bowel resection --Treated initially with  cefepime  and metronidazole  14 days.  Blood cultures negative. - repeat CT 9/7 showing new fluid collection RLQ 4.1 x 2.6 cm and persistent central pelvic collection measuring 3.6 x 3.5 cm - will d/c meropenem , 9/10 and do trial off abx; afebrile and leukocytosis stable for now; continue daily CBC - remains on TPN until able to tolerate more intake; seems to be improving some esp with being fed - calorie count completed; RD rec'd to 1/2 rate TPN - need to still track her meals intake percentage  Hypophosphatemia - replete as needed   Hypokalemia - Replete as needed   Metabolic Encephalopathy on the background of Alzheimer's dementia.    - Presumed worsening in setting of acute infection; son states encephalopathy worsens when she is sick -Continue delirium precautions  Blood culture contamination - 06/01/2024 culture noted with Staph hominis, presumed contaminant.  No further workup   COPD with emphysema  Continue albuterol  inhaler   Hyperlipidemia PAD (peripheral artery disease)  Thoracic aortic atherosclerosis -Holding Lipitor  History of essential hypertension On as needed  IV hydralazine    Weakness, deconditioning.   - Seen by PT OT and recommended skilled nursing facility placement    Dysphagia  - s/p SLP eval - continue regular diet    Nutrition Problem: Inadequate oral intake Etiology: lethargy/confusion (dementia, nausea) Signs/Symptoms: meal completion < 50% Interventions: TPN  Estimated body mass index is 28.13 kg/m as calculated from the following:   Height as of this encounter: 5' 2 (1.575 m).   Weight as of this encounter: 69.8 kg.  Interval History:  Son present bedside this morning.  No events overnight. Seemingly doing okay.  Cutting TPN rate today and need to still track meal intake amount.    Old records reviewed in assessment of this patient  Antimicrobials: Meropenem .  Discontinued 06/23/2024  DVT prophylaxis:  enoxaparin  (LOVENOX ) injection 40 mg Start: 06/16/24 2100 SCDs Start: 06/02/24 1114   Code Status:   Code Status: Full Code  Mobility Assessment (Last 72 Hours)     Mobility Assessment     Row Name 06/24/24 1005 06/24/24 0845 06/24/24 0133 06/23/24 1541 06/23/24 0800   Does the patient have exclusion criteria? No - Perform mobility assessment No - Perform mobility assessment No - Perform mobility assessment -- No - Perform mobility assessment   What is the highest level of mobility based on the mobility assessment? Level 2 (Chairfast) - Balance while sitting on edge of bed and cannot stand Level 2 (Chairfast) - Balance while sitting on edge of bed and cannot stand Level 2 (Chairfast) - Balance while sitting on edge of bed and cannot stand Level 2 (Chairfast) - Balance while sitting on edge of bed and cannot stand Level 2 (Chairfast) - Balance while sitting on edge of bed and cannot stand   Is the above level different from baseline mobility prior to current illness? Yes - Recommend PT order Yes - Recommend PT order Yes - Recommend PT order -- Yes - Recommend PT order    Row Name 06/22/24 1915 06/22/24 1351 06/21/24  1945       Does the patient have exclusion criteria? No - Perform mobility assessment No - Perform mobility assessment No - Perform mobility assessment     What is the highest level of mobility based on the mobility assessment? Level 2 (Chairfast) - Balance while sitting on edge of bed and cannot stand Level 2 (Chairfast) - Balance while sitting on edge of bed and cannot stand Level 2 (Chairfast) - Balance while sitting on edge of bed and cannot stand     Is the above level different from baseline mobility prior to current illness? Yes - Recommend PT order Yes - Recommend PT order Yes - Recommend PT order        Barriers to discharge: None Disposition Plan: SNF HH orders placed: N/A Status is: Inpatient  Objective: Blood pressure 130/83, pulse (!) 104, temperature 98.5 F (36.9 C), resp. rate 20, height 5' 2 (1.575 m), weight 70.9 kg, SpO2 100%.  Examination:  Physical Exam Constitutional:      Comments: Pleasant elderly woman lying in bed pleasantly confused  HENT:     Head: Normocephalic and atraumatic.     Mouth/Throat:     Mouth: Mucous membranes are moist.  Eyes:     Extraocular Movements: Extraocular movements intact.  Cardiovascular:     Rate and Rhythm: Normal rate and regular rhythm.  Pulmonary:     Effort: Pulmonary effort is normal. No respiratory distress.  Breath sounds: Normal breath sounds. No wheezing.  Abdominal:     General: Bowel sounds are normal. There is no distension.     Palpations: Abdomen is soft.     Tenderness: There is no abdominal tenderness.     Comments: Midline abdominal incision noted with surgical dressing in place.  Wound is open with secondary intention  Musculoskeletal:        General: Normal range of motion.     Cervical back: Normal range of motion and neck supple.  Skin:    General: Skin is warm and dry.  Neurological:     Mental Status: Mental status is at baseline. She is disoriented.      Consultants:  General  surgery  Procedures:  06/09/2024: Diagnostic laparoscopy, exploratory laparotomy with small bowel resection  Data Reviewed: Results for orders placed or performed during the hospital encounter of 06/02/24 (from the past 24 hours)  Glucose, capillary     Status: Abnormal   Collection Time: 06/23/24  5:48 PM  Result Value Ref Range   Glucose-Capillary 101 (H) 70 - 99 mg/dL  Glucose, capillary     Status: Abnormal   Collection Time: 06/23/24 11:46 PM  Result Value Ref Range   Glucose-Capillary 128 (H) 70 - 99 mg/dL  Comprehensive metabolic panel     Status: Abnormal   Collection Time: 06/24/24  3:43 AM  Result Value Ref Range   Sodium 140 135 - 145 mmol/L   Potassium 4.2 3.5 - 5.1 mmol/L   Chloride 105 98 - 111 mmol/L   CO2 23 22 - 32 mmol/L   Glucose, Bld 101 (H) 70 - 99 mg/dL   BUN 26 (H) 8 - 23 mg/dL   Creatinine, Ser 9.57 (L) 0.44 - 1.00 mg/dL   Calcium  9.3 8.9 - 10.3 mg/dL   Total Protein 6.1 (L) 6.5 - 8.1 g/dL   Albumin  3.2 (L) 3.5 - 5.0 g/dL   AST 27 15 - 41 U/L   ALT 30 0 - 44 U/L   Alkaline Phosphatase 119 38 - 126 U/L   Total Bilirubin 0.3 0.0 - 1.2 mg/dL   GFR, Estimated >39 >39 mL/min   Anion gap 12 5 - 15  Magnesium      Status: Abnormal   Collection Time: 06/24/24  3:43 AM  Result Value Ref Range   Magnesium  2.5 (H) 1.7 - 2.4 mg/dL  Phosphorus     Status: None   Collection Time: 06/24/24  3:43 AM  Result Value Ref Range   Phosphorus 3.5 2.5 - 4.6 mg/dL  CBC with Differential/Platelet     Status: Abnormal   Collection Time: 06/24/24  3:43 AM  Result Value Ref Range   WBC 8.2 4.0 - 10.5 K/uL   RBC 3.18 (L) 3.87 - 5.11 MIL/uL   Hemoglobin 9.0 (L) 12.0 - 15.0 g/dL   HCT 71.1 (L) 63.9 - 53.9 %   MCV 90.6 80.0 - 100.0 fL   MCH 28.3 26.0 - 34.0 pg   MCHC 31.3 30.0 - 36.0 g/dL   RDW 84.2 (H) 88.4 - 84.4 %   Platelets 644 (H) 150 - 400 K/uL   nRBC 0.0 0.0 - 0.2 %   Neutrophils Relative % 62 %   Neutro Abs 5.2 1.7 - 7.7 K/uL   Lymphocytes Relative 25 %    Lymphs Abs 2.0 0.7 - 4.0 K/uL   Monocytes Relative 6 %   Monocytes Absolute 0.5 0.1 - 1.0 K/uL   Eosinophils Relative 4 %  Eosinophils Absolute 0.3 0.0 - 0.5 K/uL   Basophils Relative 1 %   Basophils Absolute 0.1 0.0 - 0.1 K/uL   Immature Granulocytes 2 %   Abs Immature Granulocytes 0.12 (H) 0.00 - 0.07 K/uL  Glucose, capillary     Status: None   Collection Time: 06/24/24  5:56 AM  Result Value Ref Range   Glucose-Capillary 98 70 - 99 mg/dL  Glucose, capillary     Status: None   Collection Time: 06/24/24 12:50 PM  Result Value Ref Range   Glucose-Capillary 93 70 - 99 mg/dL    I have reviewed pertinent nursing notes, vitals, labs, and images as necessary. I have ordered labwork to follow up on as indicated.  I have reviewed the last notes from staff over past 24 hours. I have discussed patient's care plan and test results with nursing staff, CM/SW, and other staff as appropriate.  Time spent: Greater than 50% of the 55 minute visit was spent in counseling/coordination of care for the patient as laid out in the A&P.   LOS: 22 days   Alm Apo, MD [facility] 06/24/2024, 2:23 PM

## 2024-06-24 NOTE — Progress Notes (Signed)
 Called materials today at 13:05 per Dr note stating to look for mepitel to place on this patients wound while cleaning it.  Did not receive it yet and it's 16:55 so I called again no answer I will call back in a half an hour to see if they can send up.  Tillman Evert RN

## 2024-06-24 NOTE — Plan of Care (Signed)
  Problem: Clinical Measurements: Goal: Diagnostic test results will improve Outcome: Progressing   Problem: Health Behavior/Discharge Planning: Goal: Ability to manage health-related needs will improve Outcome: Progressing   Problem: Clinical Measurements: Goal: Ability to maintain clinical measurements within normal limits will improve Outcome: Progressing   Problem: Clinical Measurements: Goal: Will remain free from infection Outcome: Progressing

## 2024-06-24 NOTE — Plan of Care (Signed)

## 2024-06-24 NOTE — Progress Notes (Signed)
 PHARMACY - TOTAL PARENTERAL NUTRITION CONSULT NOTE   Indication: Prolonged ileus  Patient Measurements: Height: 5' 2 (157.5 cm) Weight: 70.9 kg (156 lb 4.9 oz) IBW/kg (Calculated) : 50.1 TPN AdjBW (KG): 61.3 Body mass index is 28.59 kg/m.   Assessment: Presented to ED with N/V/D and abdominal pain x 3d on 05/31/24. CT negative. Possibly from diverticulitis and placed on Cefepime /Flagyl . Concern for SBO, NGT placed. Continues to have persistent pain and distention. No significant intake since 8/21. Pharmacy consulted to start TPN on 8/26. S/p ex lap with small bowel resection on 8/27.    Glucose / Insulin : No h/o DM -CBGs within goal < 150 -1u SSI given in last 24 hrs Electrolytes: Mg 2.5, all others WNL including coCa (9.9) Renal: Scr and BUN WNL Hepatic: LFTs WNL; alb 3.2; TG stable Intake / Output; MIVF:  -LBM 9/8, UOP not documented  GI Imaging: 8/19 CT: Scattered colonic diverticulosis. No active diverticulitis.   8/22, 8/23, 8/25, 8/26 Abd Xray: all SBO  8/26 CT: severely thickened and inflamed central pelvic loops of small bowel concerning for ischemia. Concern for mesenteric volvulus or closed loop obstruction.  9/2 CT: 1. Interval abdominal surgery. There is a small amount of free air in the upper abdomen likely related to recent surgery. 2. New enhancing fluid collections in the right paracolic gutter and central pelvis worrisome for abscesses. 3. Persistent clustered small bowel loops in the pelvis with wall thickening and mesenteric edema. There is a small amount of air within the mesentery at this level which is new from prior. 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 4. Dilated small bowel loops in the upper abdomen worrisome for small bowel obstruction. 5. Diffuse bladder wall thickening with small amount of air in the bladder. Findings may be related to recent instrumentation or infection. 6. Small amount of simple free fluid in the right upper quadrant and left upper  quadrant. 7. Body wall edema. 8. Aortic atherosclerosis.  GI Surgeries / Procedures:  8/27 Ex lap with small bowel resection 9/3 IR drain placement to abd/pelvic fluid collection  Central access: PICC TPN start date: 8/26  Nutritional Goals: Goal TPN rate is 80 mL/hr (provides 86 g of protein and  1759 kcals per day)  RD Assessment: Kcal 1650-1850 Protein 75-90 g Fluid 1.8 L/day  Current Nutrition:  Boost/Breeze 1 container TID Ensure Plus High Protein BID  9/4 Regular diet - not eating much. Surgery deferring to TRH for decision regarding TPN continuation.   9/6 Discussed enteral nutrition options with RN, MD, RD.  Calorie count ordered.  Add magic cup with meals.  9/8 See RN notes 9/7 for meal documentation - breakfast 25%, lunch 10%, dinner 25% (total intake ~ 25% needs) 9/9 Cal count ongoing - ate 64% of kcal needs and 50% of protein needs on 9/8 per RD note 9/10: await RD note before possibly starting to wean TPN  PO intake improved with assistance from family and staff. Okay to decrease TPN to half rate per MD and RD.   Plan:  -Decrease TPN to 40 mL/hr tonight -Electrolytes in TPN:  Na 50 mEq/L K 40 mEq/L Ca 5 mEq/L Mg 3 mEq/L Phos 15 mmol/L Cl:Ac 1:1 -Add standard MVI and trace elements to TPN -Continue Sensitive q6h SSI -Monitor TPN labs on Mon/Thurs, and PRN -Continue to follow for plan of care   Lindsay Mason, PharmD, BCPS Clinical Pharmacist 06/24/2024 8:53 AM

## 2024-06-25 DIAGNOSIS — Z9049 Acquired absence of other specified parts of digestive tract: Secondary | ICD-10-CM | POA: Diagnosis not present

## 2024-06-25 DIAGNOSIS — A419 Sepsis, unspecified organism: Secondary | ICD-10-CM | POA: Diagnosis not present

## 2024-06-25 LAB — GLUCOSE, CAPILLARY
Glucose-Capillary: 97 mg/dL (ref 70–99)
Glucose-Capillary: 109 mg/dL — ABNORMAL HIGH (ref 70–99)
Glucose-Capillary: 98 mg/dL (ref 70–99)

## 2024-06-25 LAB — CBC WITH DIFFERENTIAL/PLATELET
Abs Immature Granulocytes: 0.09 K/uL — ABNORMAL HIGH (ref 0.00–0.07)
Basophils Absolute: 0.1 K/uL (ref 0.0–0.1)
Basophils Relative: 1 %
Eosinophils Absolute: 0.4 K/uL (ref 0.0–0.5)
Eosinophils Relative: 5 %
HCT: 29.2 % — ABNORMAL LOW (ref 36.0–46.0)
Hemoglobin: 9.2 g/dL — ABNORMAL LOW (ref 12.0–15.0)
Immature Granulocytes: 1 %
Lymphocytes Relative: 27 %
Lymphs Abs: 2.2 K/uL (ref 0.7–4.0)
MCH: 28.6 pg (ref 26.0–34.0)
MCHC: 31.5 g/dL (ref 30.0–36.0)
MCV: 90.7 fL (ref 80.0–100.0)
Monocytes Absolute: 0.6 K/uL (ref 0.1–1.0)
Monocytes Relative: 7 %
Neutro Abs: 4.8 K/uL (ref 1.7–7.7)
Neutrophils Relative %: 59 %
Platelets: 614 K/uL — ABNORMAL HIGH (ref 150–400)
RBC: 3.22 MIL/uL — ABNORMAL LOW (ref 3.87–5.11)
RDW: 15.9 % — ABNORMAL HIGH (ref 11.5–15.5)
WBC: 8.2 K/uL (ref 4.0–10.5)
nRBC: 0 % (ref 0.0–0.2)

## 2024-06-25 MED ORDER — LORAZEPAM 2 MG/ML IJ SOLN
1.0000 mg | Freq: Once | INTRAMUSCULAR | Status: AC
  Administered 2024-06-25: 1 mg via INTRAVENOUS
  Filled 2024-06-25: qty 1

## 2024-06-25 MED ORDER — TRAVASOL 10 % IV SOLN
INTRAVENOUS | Status: AC
  Filled 2024-06-25: qty 864

## 2024-06-25 NOTE — Progress Notes (Addendum)
  16 Days Post-Op   Chief Complaint/Subjective: No acute changes; pleasantly demented this morning, no family at bedside  Per RN she may have eaten 25% of her plate at 3 PM, unclear other PO intake.   Objective: Vital signs in last 24 hours: Temp:  [98.3 F (36.8 C)-98.5 F (36.9 C)] 98.3 F (36.8 C) (09/12 0434) Pulse Rate:  [102-106] 102 (09/12 0434) Resp:  [16-20] 16 (09/12 0434) BP: (130-131)/(83-89) 131/83 (09/12 0434) SpO2:  [99 %-100 %] 99 % (09/12 0434) Last BM Date : 06/21/24 Intake/Output from previous day: 09/11 0701 - 09/12 0700 In: 1980.6 [I.V.:1980.6] Out: -   PE: Gen: NAD Resp: nonlabored Card: tachycardic Abd: incision open one area of dehiscence; no mepitel on wound    Lab Results:  Recent Labs    06/24/24 0343 06/25/24 0048  WBC 8.2 8.2  HGB 9.0* 9.2*  HCT 28.8* 29.2*  PLT 644* 614*   Recent Labs    06/24/24 0343  NA 140  K 4.2  CL 105  CO2 23  GLUCOSE 101*  BUN 26*  CREATININE 0.42*  CALCIUM  9.3   No results for input(s): LABPROT, INR in the last 72 hours.    Component Value Date/Time   NA 140 06/24/2024 0343   NA 144 03/02/2024 1554   K 4.2 06/24/2024 0343   CL 105 06/24/2024 0343   CO2 23 06/24/2024 0343   GLUCOSE 101 (H) 06/24/2024 0343   BUN 26 (H) 06/24/2024 0343   BUN 20 03/02/2024 1554   CREATININE 0.42 (L) 06/24/2024 0343   CALCIUM  9.3 06/24/2024 0343   PROT 6.1 (L) 06/24/2024 0343   PROT 6.1 03/02/2024 1554   ALBUMIN  3.2 (L) 06/24/2024 0343   ALBUMIN  3.9 03/02/2024 1554   AST 27 06/24/2024 0343   ALT 30 06/24/2024 0343   ALKPHOS 119 06/24/2024 0343   BILITOT 0.3 06/24/2024 0343   BILITOT 0.2 03/02/2024 1554   GFRNONAA >60 06/24/2024 0343   GFRAA >60 01/22/2020 0226    Assessment/Plan  s/p Procedure(s): LAPAROSCOPY, DIAGNOSTIC, EXPLORATORY LAPAROTOMY WITH SMALL BOWEL RESECTION 06/09/2024  -bowel regimen - I placed another order with the [facility] for the mepitel, spoke to RN face to face about this in hopes  of getting it on her wound today; wound does seem to be closing/improving   FEN - reg diet; ok to wean TPN to 1/2 pending results of calorie count  VTE - lovenox  ID - no issues Disposition - discharge planning    LOS: 23 days   I reviewed last 24 h vitals and pain scores, last 48 h intake and output, last 24 h labs and trends, and last 24 h imaging results.  This care required moderate level of medical decision making.   Almarie RAMAN [facility] 06/25/2024, 9:42 AM Please see Amion for pager number during day hours 7:00am-4:30pm or 7:00am -11:30am on weekends

## 2024-06-25 NOTE — Progress Notes (Signed)
 PHARMACY - TOTAL PARENTERAL NUTRITION CONSULT NOTE   Indication: Prolonged ileus  Patient Measurements: Height: 5' 2 (157.5 cm) Weight: 70.9 kg (156 lb 4.9 oz) IBW/kg (Calculated) : 50.1 TPN AdjBW (KG): 61.3 Body mass index is 28.59 kg/m.   Assessment: Presented to ED with N/V/D and abdominal pain x 3d on 05/31/24. CT negative. Possibly from diverticulitis and placed on Cefepime /Flagyl . Concern for SBO, NGT placed. Continues to have persistent pain and distention. No significant intake since 8/21. Pharmacy consulted to start TPN on 8/26. S/p ex lap with small bowel resection on 8/27.    Glucose / Insulin : No h/o DM -CBGs within goal < 150 -1u SSI given in last 24 hrs Electrolytes: Mg 2.5, all others WNL including coCa (9.9) Renal: Scr and BUN WNL Hepatic: LFTs WNL; alb 3.2; TG stable Intake / Output; MIVF:  -LBM 9/8, UOP not documented  GI Imaging: 8/19 CT: Scattered colonic diverticulosis. No active diverticulitis.   8/22, 8/23, 8/25, 8/26 Abd Xray: all SBO  8/26 CT: severely thickened and inflamed central pelvic loops of small bowel concerning for ischemia. Concern for mesenteric volvulus or closed loop obstruction.  9/2 CT: 1. Interval abdominal surgery. There is a small amount of free air in the upper abdomen likely related to recent surgery. 2. New enhancing fluid collections in the right paracolic gutter and central pelvis worrisome for abscesses. 3. Persistent clustered small bowel loops in the pelvis with wall thickening and mesenteric edema. There is a small amount of air within the mesentery at this level which is new from prior. 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 4. Dilated small bowel loops in the upper abdomen worrisome for small bowel obstruction. 5. Diffuse bladder wall thickening with small amount of air in the bladder. Findings may be related to recent instrumentation or infection. 6. Small amount of simple free fluid in the right upper quadrant and left upper  quadrant. 7. Body wall edema. 8. Aortic atherosclerosis.  GI Surgeries / Procedures:  8/27 Ex lap with small bowel resection 9/3 IR drain placement to abd/pelvic fluid collection  Central access: PICC TPN start date: 8/26  Nutritional Goals: Goal TPN rate is 80 mL/hr (provides 86 g of protein and  1759 kcals per day)  RD Assessment: Kcal 1650-1850 Protein 75-90 g Fluid 1.8 L/day  Current Nutrition:  Boost/Breeze 1 container TID Ensure Plus High Protein BID  Patient initially with minimal PO intake which had been improving with assistance from family and staff. TPN was weaned to half rate in anticipation of continued PO intake. Unfortunately, it appears intake may have decreased.   Plan:  -Per discussion with Dr. Patsy, increase TPN back to goal 80 mL/hr tonight -Electrolytes in TPN:  Na 50 mEq/L K 40 mEq/L Ca 5 mEq/L Mg 0 mEq/L Phos 10 mmol/L Cl:Ac 1:1 -Add standard MVI and trace elements to TPN -Continue Sensitive q6h SSI -Monitor TPN labs on Mon/Thurs, will recheck tomorrow given changing TPN rates over the past 48 hrs -Continue to follow for plan of care   Stefano MARLA Bologna, PharmD, BCPS Clinical Pharmacist 06/25/2024 11:38 AM

## 2024-06-25 NOTE — TOC Progression Note (Signed)
 Transition of Care [facility]) - Progression Note    Patient Details  Name: Lindsay Mason MRN: [id number] Date of Birth: 1951/02/15  [facility] CM/SW Contact  Rashmi Tallent, Nathanel, RN Phone Number: 06/25/2024, 10:50 AM  Clinical Narrative:Noted TPN weaning, regular diet, open wound improving. Not medically stable. Refax fl2 d/t weaning TPN closer to stability, auth needed.  See prior TOC note for additional info.      Expected Discharge Plan: Skilled Nursing Facility Barriers to Discharge: Continued Medical Work up               Expected Discharge Plan and Services In-house Referral: Clinical Social Work Discharge Planning Services: CM Consult Post Acute Care Choice: Skilled Nursing Facility                   DME Arranged: N/A DME Agency: NA                   Social Drivers of Health (SDOH) Interventions SDOH Screenings   Food Insecurity: No Food Insecurity (06/02/2024)  Housing: Low Risk  (06/02/2024)  Transportation Needs: No Transportation Needs (06/02/2024)  Utilities: Not At Risk (06/02/2024)  Alcohol Screen: Low Risk  (05/05/2024)  Depression (PHQ2-9): High Risk (05/07/2024)  Financial Resource Strain: Low Risk  (05/05/2024)  Physical Activity: Sufficiently Active (05/05/2024)  Social Connections: Moderately Isolated (06/02/2024)  Stress: No Stress Concern Present (05/05/2024)  Tobacco Use: Medium Risk (06/09/2024)  Health Literacy: Inadequate Health Literacy (05/05/2024)    Readmission Risk Interventions    06/14/2024    2:25 PM 01/11/2024   10:01 AM  Readmission Risk Prevention Plan  Post Dischage Appt  Complete  Medication Screening  Complete  Transportation Screening Complete Complete  Medication Review Oceanographer) Complete   HRI or Home Care Consult Complete   SW Recovery Care/Counseling Consult Complete   Palliative Care Screening Not Applicable   Skilled Nursing Facility Complete

## 2024-06-25 NOTE — Progress Notes (Signed)
 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    Patient presented to the hospital with positive blood culture with staph species.  Patient had lethargy and worsening mental status.  In the ED patient had slightly low blood pressure.  White blood cell elevated at 13.  CT abdomen and pelvis done on 06/01/2024 revealed no acute finding.  Patient was admitted to the hospital for further evaluation and treatment.  During hospitalization, patient continued to have abdominal pain and distention.  General surgery was consulted.   Repeat CT scanning of the abdomen and pelvis with contrast done on 06/08/2024 revealed severely thickened and inflamed central pelvic loops of small bowel concerning for ischemia. Complex appearance due to indistinct bowel wall margins, surrounding edema, dilated small bowel extending towards these loops, and at least some sense of twisting of bowel along adjacent branches of the superior mesenteric vein concerning for mesenteric volvulus or closed loop obstruction.   Patient eventually underwent exploratory laparotomy with a small bowel resection 06/09/2024.  Patient is currently on TPN, with very poor oral intake.  Repeat CT scan 06/15/2024 revealed new enhancing fluid collections in the right paracolic gutter and central pelvis worrisome for abscesses. IR consulted for drain placement. Underwent drain placement on 9/03.    Assessment & Plan:   Sepsis - resolved  Concern for SB ischemia vs SBO vs volvulus with possible microperforation -Patient initially had leukocytosis with  hypotension and lethargy - repeat CT 8/26 showed: Severely thickened and inflamed central pelvic loops of small bowel concerning for ischemia. Complex appearance due to indistinct bowel wall margins, surrounding edema, dilated small bowel extending towards these loops, and at least some sense of twisting of bowel along adjacent branches of the superior mesenteric vein concerning for mesenteric volvulus or closed loop obstruction. No definite pneumatosis. Small locules of gas along the left inguinal ring and along the inflamed loops of bowel are presumably intraluminal but could conceivably represent contained extraluminal gas from local microperforation. - Followed by surgery and taken to the OR on 06/09/2024 for diagnostic laparoscopy, exploratory laparotomy with small bowel resection --Treated initially with  cefepime  and metronidazole  14 days.  Blood cultures negative. - repeat CT 9/7 showing new fluid collection RLQ 4.1 x 2.6 cm and persistent central pelvic collection measuring 3.6 x 3.5 cm - Remains afebrile with resolved leukocytosis off antibiotics -Intake poor still/again.  Need to document intake every meal.  Daily weights per RD recommendations also - resume full dose TPN - if diet unable to be advanced and/or TPN weaned, may need to consider long term goals more; I discussed this bedside with daughter on 9/12 too. I also would not recommend a PEG tube in this context with advanced dementia and multiple co-morbidities  Hypophosphatemia - replete as needed   Hypokalemia - Replete as needed   Metabolic Encephalopathy on the background of Alzheimer's dementia.    - Presumed worsening in setting of acute infection; son states encephalopathy worsens when she is sick -Continue delirium precautions  Blood culture contamination - 06/01/2024 culture noted with Staph hominis, presumed contaminant.  No further workup   COPD with emphysema  Continue albuterol  inhaler   Hyperlipidemia PAD  (peripheral artery disease)  Thoracic aortic atherosclerosis -Holding Lipitor   History of essential hypertension On as needed IV hydralazine    Weakness, deconditioning.   - Seen by PT OT and recommended skilled nursing facility placement    Dysphagia  - s/p SLP eval - continue regular diet    Nutrition Problem: Inadequate oral intake Etiology: lethargy/confusion (dementia, nausea) Signs/Symptoms: meal completion < 50% Interventions: TPN  Estimated body mass index is 28.13 kg/m as calculated from the following:   Height as of this encounter: 5' 2 (1.575 m).   Weight as of this encounter: 69.8 kg.  Interval History:  Still having poor intake unfortunately.  Updated daughter bedside this morning.  Patient is otherwise comfortable and pleasantly confused. Some goals of care discussions held this morning also regarding what we might do if unable to advance diet or maintain adequate nutrition without TPN.  Family plans to begin some more discussions.   Old records reviewed in assessment of this patient  Antimicrobials: Meropenem .  Discontinued 06/23/2024  DVT prophylaxis:  enoxaparin  (LOVENOX ) injection 40 mg Start: 06/16/24 2100 SCDs Start: 06/02/24 1114   Code Status:   Code Status: Full Code  Mobility Assessment (Last 72 Hours)     Mobility Assessment     Row Name 06/25/24 1144 06/25/24 0859 06/25/24 0500 06/24/24 1005 06/24/24 0845   Does the patient have exclusion criteria? -- No - Perform mobility assessment No - Perform mobility assessment No - Perform mobility assessment No - Perform mobility assessment   What is the highest level of mobility based on the mobility assessment? Level 3 (Stands with assistance) - Balance while standing  and cannot march in place Level 2 (Chairfast) - Balance while sitting on edge of bed and cannot stand Level 2 (Chairfast) - Balance while sitting on edge of bed and cannot stand Level 2 (Chairfast) - Balance while sitting on edge of  bed and cannot stand Level 2 (Chairfast) - Balance while sitting on edge of bed and cannot stand   Is the above level different from baseline mobility prior to current illness? -- Yes - Recommend PT order Yes - Recommend PT order Yes - Recommend PT order Yes - Recommend PT order    Row Name 06/24/24 0133 06/23/24 1541 06/23/24 0800 06/22/24 1915     Does the patient have exclusion criteria? No - Perform mobility assessment -- No - Perform mobility assessment No - Perform mobility assessment    What is the highest level of mobility based on the mobility assessment? Level 2 (Chairfast) - Balance while sitting on edge of bed and cannot stand Level 2 (Chairfast) - Balance while sitting on edge of bed and cannot stand Level 2 (Chairfast) - Balance while sitting on edge of bed and cannot stand Level 2 (Chairfast) - Balance while sitting on edge of bed and cannot stand    Is the above level different from baseline mobility prior to current illness? Yes - Recommend PT order -- Yes - Recommend PT order Yes - Recommend PT order       Barriers to discharge: None Disposition Plan: SNF HH orders placed: N/A Status is: Inpatient  Objective: Blood pressure (!) 114/98, pulse (!) 110, temperature 99.1 F (37.3 C), temperature source Oral, resp. rate 19, height 5' 2 (1.575 m), weight 70.9 kg, SpO2 93%.  Examination:  Physical Exam Constitutional:      Comments: Pleasant elderly woman lying in bed pleasantly confused  HENT:  Head: Normocephalic and atraumatic.     Mouth/Throat:     Mouth: Mucous membranes are moist.  Eyes:     Extraocular Movements: Extraocular movements intact.  Cardiovascular:     Rate and Rhythm: Normal rate and regular rhythm.  Pulmonary:     Effort: Pulmonary effort is normal. No respiratory distress.     Breath sounds: Normal breath sounds. No wheezing.  Abdominal:     General: Bowel sounds are normal. There is no distension.     Palpations: Abdomen is soft.      Tenderness: There is no abdominal tenderness.     Comments: Midline abdominal incision noted with surgical dressing in place.  Wound is open with secondary intention  Musculoskeletal:        General: Normal range of motion.     Cervical back: Normal range of motion and neck supple.  Skin:    General: Skin is warm and dry.  Neurological:     Mental Status: Mental status is at baseline. She is disoriented.      Consultants:  General surgery  Procedures:  06/09/2024: Diagnostic laparoscopy, exploratory laparotomy with small bowel resection  Data Reviewed: Results for orders placed or performed during the hospital encounter of 06/02/24 (from the past 24 hours)  Glucose, capillary     Status: Abnormal   Collection Time: 06/24/24  6:11 PM  Result Value Ref Range   Glucose-Capillary 111 (H) 70 - 99 mg/dL  CBC with Differential/Platelet     Status: Abnormal   Collection Time: 06/25/24 12:48 AM  Result Value Ref Range   WBC 8.2 4.0 - 10.5 K/uL   RBC 3.22 (L) 3.87 - 5.11 MIL/uL   Hemoglobin 9.2 (L) 12.0 - 15.0 g/dL   HCT 70.7 (L) 63.9 - 53.9 %   MCV 90.7 80.0 - 100.0 fL   MCH 28.6 26.0 - 34.0 pg   MCHC 31.5 30.0 - 36.0 g/dL   RDW 84.0 (H) 88.4 - 84.4 %   Platelets 614 (H) 150 - 400 K/uL   nRBC 0.0 0.0 - 0.2 %   Neutrophils Relative % 59 %   Neutro Abs 4.8 1.7 - 7.7 K/uL   Lymphocytes Relative 27 %   Lymphs Abs 2.2 0.7 - 4.0 K/uL   Monocytes Relative 7 %   Monocytes Absolute 0.6 0.1 - 1.0 K/uL   Eosinophils Relative 5 %   Eosinophils Absolute 0.4 0.0 - 0.5 K/uL   Basophils Relative 1 %   Basophils Absolute 0.1 0.0 - 0.1 K/uL   Immature Granulocytes 1 %   Abs Immature Granulocytes 0.09 (H) 0.00 - 0.07 K/uL  Glucose, capillary     Status: None   Collection Time: 06/25/24  1:21 AM  Result Value Ref Range   Glucose-Capillary 97 70 - 99 mg/dL  Glucose, capillary     Status: Abnormal   Collection Time: 06/25/24 12:27 PM  Result Value Ref Range   Glucose-Capillary 109 (H) 70 - 99  mg/dL   Comment 1 Notify RN     I have reviewed pertinent nursing notes, vitals, labs, and images as necessary. I have ordered labwork to follow up on as indicated.  I have reviewed the last notes from staff over past 24 hours. I have discussed patient's care plan and test results with nursing staff, CM/SW, and other staff as appropriate.  Time spent: Greater than 50% of the 55 minute visit was spent in counseling/coordination of care for the patient as laid out in the A&P.  LOS: 23 days   Alm Apo, MD [facility] 06/25/2024, 4:26 PM

## 2024-06-25 NOTE — Progress Notes (Signed)
 Physical Therapy Treatment Patient Details Name: Lindsay Mason MRN: [id number] DOB: 08/04/1951 Today's Date: 06/25/2024   History of Present Illness Pt is 73 yo female presented on 06/02/24  with + blood culture with staph species, lethargy, and worsening mental status. Pt admitted with SIRS, abdominal pain, N/V likely secondary to acute diverticulitis, and SBO vs ileus.  Pt with NG tube and surgery following. s/p exploratory lap with SB resection 2* ischemic bowel 06/09/24.   Pt also with metabolic encephalopathy on background of Alzheimer's dementia.  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 Pt wakes to loud voice and sternal rub, smiles and agrees to therapy. Pt needing constant sequencing cues for bed mobility to log roll and initiate movement, min A to power up into sitting. Pt powers to stand with bil HHA, BLE braced against front of bed, min A+2 for safety and shifting weight forward. Pt's family positioned in doorway, on 2nd STS pt attempts to take steps, initially weight shifting laterally and needing forward motion progression assistance for stepping as pt demonstrates wide BOS with shuffling step progression. Pt with significantly decreased cadence, standing and looking around room, easily distracted, ultimately returns to bed for seated rest break. Total A to return to supine for trunk and BLE management. Pt reports pain at EOS once back in supine citing throat and back pain, also reports nausea- notified RN. Pt's alertness varies throughout session, difficulty arousing when in supine and more alert and using short phrases when sitting and standing with  therapist and family. Pt's family present throughout session providing encouragement throughout.   If plan is discharge home, recommend the following: A lot of help with bathing/dressing/bathroom;Assistance with cooking/housework;Assist for transportation;Help with stairs or ramp for entrance;Direct supervision/assist for medications management;Direct supervision/assist for financial management;Supervision due to cognitive status;A lot of help with walking and/or transfers   Can travel by private vehicle     No  Equipment Recommendations  None recommended by PT    Recommendations for Other Services       Precautions / Restrictions Precautions Precautions: Fall Recall of Precautions/Restrictions: Impaired Precaution/Restrictions Comments: abdominal sx, dementia Required Braces or Orthoses: Other Brace Other Brace: abdominal binder Restrictions Weight Bearing Restrictions Per Provider Order: No     Mobility  Bed Mobility Overal bed mobility: Needs Assistance Bed Mobility: Rolling, Sidelying to Sit, Sit to Sidelying Rolling: Min assist, Used rails Sidelying to sit: Min assist, Used rails     Sit to sidelying: Total assist, +2 for safety/equipment General bed mobility comments: constant multimodal cues for log rolling into sitting and back to supine, min A to roll onto side and power up into sitting, total A to return to supine for trunk and BLE assist    Transfers Overall transfer level: Needs assistance Equipment used: 2 person hand held assist Transfers: Sit to/from Stand Sit to Stand: Min assist, +2 physical assistance, +2 safety/equipment           General transfer comment: min A+2 with bil HHA to power to stand, initation cues, weight through heels with BLE braced against bed    Ambulation/Gait Ambulation/Gait assistance: Mod assist, +2 physical assistance, +2 safety/equipment Gait Distance (Feet): 20 Feet Assistive  device: 2 person hand held assist Gait  Pattern/deviations: Step-to pattern, Decreased stride length, Shuffle, Wide base of support, Trunk flexed Gait velocity: decreased     General Gait Details: mod A+2 with wide BOS and shuffling step progression, forward flexed trunk, very distracted requiring constant multimodal cues for forward gait with family also providing visual target and verbal encouragement, limited by cognition and distraction as pt turns to return to bed, significantly increased time with static standing   Stairs             Wheelchair Mobility     Tilt Bed    Modified Rankin (Stroke Patients Only)       Balance Overall balance assessment: Needs assistance Sitting-balance support: No upper extremity supported, Feet supported Sitting balance-Leahy Scale: Fair     Standing balance support: During functional activity, Reliant on assistive device for balance, Bilateral upper extremity supported Standing balance-Leahy Scale: Poor Standing balance comment: bil HHA                            Communication Communication Communication: Impaired Factors Affecting Communication: Difficulty expressing self  Cognition Arousal: Alert, Lethargic, Obtunded (varies, obtunded in supine but able to aroused and opens eyes and interacts with therapist and family, returns to obtunded once back in supine) Behavior During Therapy: Anxious, Restless   PT - Cognitive impairments: History of cognitive impairments                       PT - Cognition Comments: pt arousal varies, obtunded in supine but alertness improves with upright sitting and standing, using short phrases and able to make needs known with some difficulty Following commands: Impaired Following commands impaired: Follows one step commands inconsistently, Follows one step commands with increased time    Cueing Cueing Techniques: Verbal cues, Gestural cues, Tactile cues, Visual cues  Exercises      General Comments General  comments (skin integrity, edema, etc.): BP 114/73 and HR 91 at EOS once back in supine      Pertinent Vitals/Pain Pain Assessment Pain Assessment: Faces Faces Pain Scale: Hurts little more Pain Location: back, throat Pain Descriptors / Indicators: Discomfort Pain Intervention(s): Limited activity within patient's tolerance, Monitored during session, Repositioned    Home Living                          Prior Function            PT Goals (current goals can now be found in the care plan section) Acute Rehab PT Goals Patient Stated Goal: to go for long walks again PT Goal Formulation: With patient/family Time For Goal Achievement: 06/26/24 Potential to Achieve Goals: Fair Progress towards PT goals: Progressing toward goals    Frequency    Min 2X/week      PT Plan      Co-evaluation              AM-PAC PT 6 Clicks Mobility   Outcome Measure  Help needed turning from your back to your side while in a flat bed without using bedrails?: A Little Help needed moving from lying on your back to sitting on the side of a flat bed without using bedrails?: A Little Help needed moving to and from a bed to a chair (including a wheelchair)?: A Lot Help needed standing up from a chair using your arms (e.g., wheelchair or  bedside chair)?: A Lot Help needed to walk in hospital room?: Total Help needed climbing 3-5 steps with a railing? : Total 6 Click Score: 12    End of Session Equipment Utilized During Treatment: Gait belt Activity Tolerance: Patient tolerated treatment well Patient left: in bed;with call bell/phone within reach;with nursing/sitter in room;with restraints reapplied Nurse Communication: Mobility status;Other (comment) (pain medication, nausea medication, vitals) PT Visit Diagnosis: Difficulty in walking, not elsewhere classified (R26.2);Unsteadiness on feet (R26.81)     Time: 8947-8870 PT Time Calculation (min) (ACUTE ONLY): 37 min  Charges:     $Gait Training: 8-22 mins $Therapeutic Activity: 8-22 mins PT General Charges $$ ACUTE PT VISIT: 1 Visit                     Tori Lavonne Cass PT, DPT 06/25/24, 11:46 AM

## 2024-06-25 NOTE — Progress Notes (Signed)
 Nutrition Follow-up  DOCUMENTATION CODES:   Non-severe (moderate) malnutrition in context of chronic illness  INTERVENTION:   -TPN management per Pharmacy -Daily weights while on TPN   -Ensure Plus High Protein po BID, each supplement provides 350 kcal and 20 grams of protein. +milk to lessen sweetness   -D/c Boost Breeze -still too sweet for patient   -Magic cup TID with meals, each supplement provides 290 kcal and 9 grams of protein    -Continue family or staff assistance with meals  NUTRITION DIAGNOSIS:   Moderate Malnutrition related to chronic illness (dementia) as evidenced by moderate fat depletion, severe muscle depletion.  Ongoing.  GOAL:   Patient will meet greater than or equal to 90% of their needs  Progressing.  MONITOR:   PO intake, Supplement acceptance  ASSESSMENT:   73 y.o. 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 Currently being treated for small bowel obstruction/ileus with NG tube and IV fluids.  Patient in room, sleeping and more lethargic. Pt's daughter at bedside, tearful during interaction. Pt ate very little of breakfast tray, a few bites of Magic cups. Family to continue to encourage pt to eat.  MD has decided to increase TPN back to goal. Question what long-term plan is, with pt's dementia likely to have varying intakes and feeding tubes not indicated with dementia as they cannot improve outcomes or QOL. Will monitor for plan.  Breakfast today: ~100 kcals, 5g protein (bites of Magic cup and sips of Ensure) Lunch today ~295 kcals, 13g protein (fish, mac and cheese, broccoli, Magic cup)  TPN increasing back to goal rate of 80 ml/hr, providing 1759 kcals and 86g  protein.  Admission weight: 135 lbs Current weight: 156 lbs  Medications: Colace, Miralax   Labs reviewed: CBGs: 93-111 Elevated Mg   Diet Order:   Diet Order             Diet regular Room service appropriate? Yes; Fluid consistency: Thin  Diet effective now                   EDUCATION NEEDS:   Education needs have been addressed  Skin:  Skin Assessment: Skin Integrity Issues: Skin Integrity Issues:: Incisions Incisions: 8/27 abdomen  Last BM:  9/8  Height:   Ht Readings from Last 1 Encounters:  06/02/24 5' 2 (1.575 m)    Weight:   Wt Readings from Last 1 Encounters:  06/24/24 70.9 kg    BMI:  Body mass index is 28.59 kg/m.  Estimated Nutritional Needs:   Kcal:  1650-1850  Protein:  75-90g  Fluid:  1.8L/day  Morna Lee, MS, RD, LDN Inpatient Clinical Dietitian Contact via Secure chat

## 2024-06-26 DIAGNOSIS — K5700 Diverticulitis of small intestine with perforation and abscess without bleeding: Secondary | ICD-10-CM | POA: Diagnosis not present

## 2024-06-26 DIAGNOSIS — Z9049 Acquired absence of other specified parts of digestive tract: Secondary | ICD-10-CM | POA: Diagnosis not present

## 2024-06-26 DIAGNOSIS — F02C18 Dementia in other diseases classified elsewhere, severe, with other behavioral disturbance: Secondary | ICD-10-CM | POA: Diagnosis not present

## 2024-06-26 DIAGNOSIS — G300 Alzheimer's disease with early onset: Secondary | ICD-10-CM

## 2024-06-26 LAB — BASIC METABOLIC PANEL WITH GFR
Anion gap: 11 (ref 5–15)
BUN: 24 mg/dL — ABNORMAL HIGH (ref 8–23)
CO2: 23 mmol/L (ref 22–32)
Calcium: 9.2 mg/dL (ref 8.9–10.3)
Chloride: 105 mmol/L (ref 98–111)
Creatinine, Ser: 0.43 mg/dL — ABNORMAL LOW (ref 0.44–1.00)
GFR, Estimated: >60 mL/min (ref >60)
Glucose, Bld: 107 mg/dL — ABNORMAL HIGH (ref 70–99)
Potassium: 4.1 mmol/L (ref 3.5–5.1)
Sodium: 140 mmol/L (ref 135–145)

## 2024-06-26 LAB — CBC WITH DIFFERENTIAL/PLATELET
Abs Immature Granulocytes: 0.05 K/uL (ref 0.00–0.07)
Basophils Absolute: 0.1 K/uL (ref 0.0–0.1)
Basophils Relative: 1 %
Eosinophils Absolute: 0.7 K/uL — ABNORMAL HIGH (ref 0.0–0.5)
Eosinophils Relative: 9 %
HCT: 30 % — ABNORMAL LOW (ref 36.0–46.0)
Hemoglobin: 9.4 g/dL — ABNORMAL LOW (ref 12.0–15.0)
Immature Granulocytes: 1 %
Lymphocytes Relative: 20 %
Lymphs Abs: 1.5 K/uL (ref 0.7–4.0)
MCH: 28.3 pg (ref 26.0–34.0)
MCHC: 31.3 g/dL (ref 30.0–36.0)
MCV: 90.4 fL (ref 80.0–100.0)
Monocytes Absolute: 0.5 K/uL (ref 0.1–1.0)
Monocytes Relative: 7 %
Neutro Abs: 4.5 K/uL (ref 1.7–7.7)
Neutrophils Relative %: 62 %
Platelets: 556 K/uL — ABNORMAL HIGH (ref 150–400)
RBC: 3.32 MIL/uL — ABNORMAL LOW (ref 3.87–5.11)
RDW: 16 % — ABNORMAL HIGH (ref 11.5–15.5)
WBC: 7.2 K/uL (ref 4.0–10.5)
nRBC: 0 % (ref 0.0–0.2)

## 2024-06-26 LAB — PHOSPHORUS: Phosphorus: 4 mg/dL (ref 2.5–4.6)

## 2024-06-26 LAB — GLUCOSE, CAPILLARY
Glucose-Capillary: 105 mg/dL — ABNORMAL HIGH (ref 70–99)
Glucose-Capillary: 105 mg/dL — ABNORMAL HIGH (ref 70–99)
Glucose-Capillary: 106 mg/dL — ABNORMAL HIGH (ref 70–99)
Glucose-Capillary: 108 mg/dL — ABNORMAL HIGH (ref 70–99)
Glucose-Capillary: 113 mg/dL — ABNORMAL HIGH (ref 70–99)

## 2024-06-26 LAB — MAGNESIUM: Magnesium: 2.3 mg/dL (ref 1.7–2.4)

## 2024-06-26 MED ORDER — QUETIAPINE FUMARATE 25 MG PO TABS
50.0000 mg | ORAL_TABLET | Freq: Every day | ORAL | Status: DC
  Administered 2024-06-26: 50 mg via ORAL
  Filled 2024-06-26: qty 2

## 2024-06-26 MED ORDER — QUETIAPINE FUMARATE 25 MG PO TABS
50.0000 mg | ORAL_TABLET | Freq: Every day | ORAL | Status: DC
Start: 2024-06-26 — End: 2024-06-26

## 2024-06-26 MED ORDER — TRAVASOL 10 % IV SOLN
INTRAVENOUS | Status: AC
  Filled 2024-06-26: qty 864

## 2024-06-26 NOTE — Progress Notes (Signed)
 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    Patient presented to the hospital with positive blood culture with staph species.  Patient had lethargy and worsening mental status.  In the ED patient had slightly low blood pressure.  White blood cell elevated at 13.  CT abdomen and pelvis done on 06/01/2024 revealed no acute finding.  Patient was admitted to the hospital for further evaluation and treatment.  During hospitalization, patient continued to have abdominal pain and distention.  General surgery was consulted.   Repeat CT scanning of the abdomen and pelvis with contrast done on 06/08/2024 revealed severely thickened and inflamed central pelvic loops of small bowel concerning for ischemia. Complex appearance due to indistinct bowel wall margins, surrounding edema, dilated small bowel extending towards these loops, and at least some sense of twisting of bowel along adjacent branches of the superior mesenteric vein concerning for mesenteric volvulus or closed loop obstruction.   Patient eventually underwent exploratory laparotomy with a small bowel resection 06/09/2024.  Patient is currently on TPN, with very poor oral intake.  Repeat CT scan 06/15/2024 revealed new enhancing fluid collections in the right paracolic gutter and central pelvis worrisome for abscesses. IR consulted for drain placement. Underwent drain placement on 9/03.    Assessment & Plan:   Sepsis - resolved  Concern for SB ischemia vs SBO vs volvulus with possible microperforation -Patient initially had leukocytosis with  hypotension and lethargy - repeat CT 8/26 showed: Severely thickened and inflamed central pelvic loops of small bowel concerning for ischemia. Complex appearance due to indistinct bowel wall margins, surrounding edema, dilated small bowel extending towards these loops, and at least some sense of twisting of bowel along adjacent branches of the superior mesenteric vein concerning for mesenteric volvulus or closed loop obstruction. No definite pneumatosis. Small locules of gas along the left inguinal ring and along the inflamed loops of bowel are presumably intraluminal but could conceivably represent contained extraluminal gas from local microperforation. - Followed by surgery and taken to the OR on 06/09/2024 for diagnostic laparoscopy, exploratory laparotomy with small bowel resection --Treated initially with  cefepime  and metronidazole  14 days.  Blood cultures negative. - repeat CT 9/7 showing new fluid collection RLQ 4.1 x 2.6 cm and persistent central pelvic collection measuring 3.6 x 3.5 cm - Remains afebrile with resolved leukocytosis off antibiotics -Intake poor still/again.  Need to document intake every meal.  Daily weights per RD recommendations also - continue full dose TPN - if diet unable to be advanced and/or TPN weaned, may need to consider long term goals more; had family meeting to discuss this more in detail on 9/13 (they will continue more discussions together). Planning to continue watching her intake for now  Metabolic Encephalopathy Alzheimer's dementia - Presumed worsening in setting of acute infection; son states encephalopathy worsens when she is sick -Continue delirium precautions - trial of seroquel ; avoiding over sedating meds as able unless necessary - PRN haldol    Hypophosphatemia - replete as needed   Hypokalemia - Replete as needed   Blood culture contamination - 06/01/2024 culture noted with Staph hominis, presumed contaminant.  No  further workup   COPD  with emphysema  Continue albuterol  inhaler   Hyperlipidemia PAD (peripheral artery disease)  Thoracic aortic atherosclerosis -Holding Lipitor   History of essential hypertension On as needed IV hydralazine    Weakness, deconditioning.   - Seen by PT OT and recommended skilled nursing facility placement    Dysphagia  - s/p SLP eval - continue regular diet    Nutrition Problem: Inadequate oral intake Etiology: lethargy/confusion (dementia, nausea) Signs/Symptoms: meal completion < 50% Interventions: TPN  Estimated body mass index is 28.13 kg/m as calculated from the following:   Height as of this encounter: 5' 2 (1.575 m).   Weight as of this encounter: 69.8 kg.  Interval History:  No events overnight.  Became oversedated yesterday with Ativan  as expected but more awake and alert and comfortable this morning.  Pleasantly demented. Had a family meeting this afternoon with multiple members.  We discussed some goals of care and potential planning with next steps in regards to how long to continue TPN and monitoring her intake versus discontinuation of TPN and pursuit of rehab or potentially residential hospice if she were to rapidly decline.  Family will continue discussing further amongst themselves.    Old records reviewed in assessment of this patient  Antimicrobials: Meropenem .  Discontinued 06/23/2024  DVT prophylaxis:  enoxaparin  (LOVENOX ) injection 40 mg Start: 06/16/24 2100 SCDs Start: 06/02/24 1114   Code Status:   Code Status: Full Code  Mobility Assessment (Last 72 Hours)     Mobility Assessment     Row Name 06/26/24 0830 06/25/24 2221 06/25/24 1144 06/25/24 0859 06/25/24 0500   Does the patient have exclusion criteria? No - Perform mobility assessment No - Perform mobility assessment -- No - Perform mobility assessment No - Perform mobility assessment   What is the highest level of mobility based on the mobility assessment? Level 3 (Stands with assistance) -  Balance while standing  and cannot march in place Level 3 (Stands with assistance) - Balance while standing  and cannot march in place Level 3 (Stands with assistance) - Balance while standing  and cannot march in place Level 2 (Chairfast) - Balance while sitting on edge of bed and cannot stand Level 2 (Chairfast) - Balance while sitting on edge of bed and cannot stand   Is the above level different from baseline mobility prior to current illness? Yes - Recommend PT order Yes - Recommend PT order -- Yes - Recommend PT order Yes - Recommend PT order    Row Name 06/24/24 1005 06/24/24 0845 06/24/24 0133       Does the patient have exclusion criteria? No - Perform mobility assessment No - Perform mobility assessment No - Perform mobility assessment     What is the highest level of mobility based on the mobility assessment? Level 2 (Chairfast) - Balance while sitting on edge of bed and cannot stand Level 2 (Chairfast) - Balance while sitting on edge of bed and cannot stand Level 2 (Chairfast) - Balance while sitting on edge of bed and cannot stand     Is the above level different from baseline mobility prior to current illness? Yes - Recommend PT order Yes - Recommend PT order Yes - Recommend PT order        Barriers to discharge: None Disposition Plan: SNF HH orders placed: N/A Status is: Inpatient  Objective: Blood pressure (!) 123/92, pulse (!) 103, temperature 98.4 F (36.9 C), resp. rate 15, height 5' 2 (1.575 m), weight 67.2  kg, SpO2 98%.  Examination:  Physical Exam Constitutional:      Comments: Pleasant elderly woman lying in bed pleasantly confused  HENT:     Head: Normocephalic and atraumatic.     Mouth/Throat:     Mouth: Mucous membranes are moist.  Eyes:     Extraocular Movements: Extraocular movements intact.  Cardiovascular:     Rate and Rhythm: Normal rate and regular rhythm.  Pulmonary:     Effort: Pulmonary effort is normal. No respiratory distress.     Breath sounds:  Normal breath sounds. No wheezing.  Abdominal:     General: Bowel sounds are normal. There is no distension.     Palpations: Abdomen is soft.     Tenderness: There is no abdominal tenderness.     Comments: Midline abdominal incision noted with surgical dressing in place.  Wound is open with secondary intention  Musculoskeletal:        General: Normal range of motion.     Cervical back: Normal range of motion and neck supple.  Skin:    General: Skin is warm and dry.  Neurological:     Mental Status: Mental status is at baseline. She is disoriented.      Consultants:  General surgery  Procedures:  06/09/2024: Diagnostic laparoscopy, exploratory laparotomy with small bowel resection  Data Reviewed: Results for orders placed or performed during the hospital encounter of 06/02/24 (from the past 24 hours)  Glucose, capillary     Status: None   Collection Time: 06/25/24  6:08 PM  Result Value Ref Range   Glucose-Capillary 98 70 - 99 mg/dL   Comment 1 Notify RN   Glucose, capillary     Status: Abnormal   Collection Time: 06/26/24 12:23 AM  Result Value Ref Range   Glucose-Capillary 108 (H) 70 - 99 mg/dL  CBC with Differential/Platelet     Status: Abnormal   Collection Time: 06/26/24  3:37 AM  Result Value Ref Range   WBC 7.2 4.0 - 10.5 K/uL   RBC 3.32 (L) 3.87 - 5.11 MIL/uL   Hemoglobin 9.4 (L) 12.0 - 15.0 g/dL   HCT 69.9 (L) 63.9 - 53.9 %   MCV 90.4 80.0 - 100.0 fL   MCH 28.3 26.0 - 34.0 pg   MCHC 31.3 30.0 - 36.0 g/dL   RDW 83.9 (H) 88.4 - 84.4 %   Platelets 556 (H) 150 - 400 K/uL   nRBC 0.0 0.0 - 0.2 %   Neutrophils Relative % 62 %   Neutro Abs 4.5 1.7 - 7.7 K/uL   Lymphocytes Relative 20 %   Lymphs Abs 1.5 0.7 - 4.0 K/uL   Monocytes Relative 7 %   Monocytes Absolute 0.5 0.1 - 1.0 K/uL   Eosinophils Relative 9 %   Eosinophils Absolute 0.7 (H) 0.0 - 0.5 K/uL   Basophils Relative 1 %   Basophils Absolute 0.1 0.0 - 0.1 K/uL   Immature Granulocytes 1 %   Abs Immature  Granulocytes 0.05 0.00 - 0.07 K/uL  Basic metabolic panel with GFR     Status: Abnormal   Collection Time: 06/26/24  3:37 AM  Result Value Ref Range   Sodium 140 135 - 145 mmol/L   Potassium 4.1 3.5 - 5.1 mmol/L   Chloride 105 98 - 111 mmol/L   CO2 23 22 - 32 mmol/L   Glucose, Bld 107 (H) 70 - 99 mg/dL   BUN 24 (H) 8 - 23 mg/dL   Creatinine, Ser 9.56 (L) 0.44 -  1.00 mg/dL   Calcium  9.2 8.9 - 10.3 mg/dL   GFR, Estimated >39 >39 mL/min   Anion gap 11 5 - 15  Magnesium      Status: None   Collection Time: 06/26/24  3:37 AM  Result Value Ref Range   Magnesium  2.3 1.7 - 2.4 mg/dL  Phosphorus     Status: None   Collection Time: 06/26/24  3:37 AM  Result Value Ref Range   Phosphorus 4.0 2.5 - 4.6 mg/dL  Glucose, capillary     Status: Abnormal   Collection Time: 06/26/24  5:54 AM  Result Value Ref Range   Glucose-Capillary 105 (H) 70 - 99 mg/dL  Glucose, capillary     Status: Abnormal   Collection Time: 06/26/24 11:43 AM  Result Value Ref Range   Glucose-Capillary 113 (H) 70 - 99 mg/dL  Glucose, capillary     Status: Abnormal   Collection Time: 06/26/24  5:00 PM  Result Value Ref Range   Glucose-Capillary 106 (H) 70 - 99 mg/dL    I have reviewed pertinent nursing notes, vitals, labs, and images as necessary. I have ordered labwork to follow up on as indicated.  I have reviewed the last notes from staff over past 24 hours. I have discussed patient's care plan and test results with nursing staff, CM/SW, and other staff as appropriate.  Time spent: Greater than 50% of the 55 minute visit was spent in counseling/coordination of care for the patient as laid out in the A&P.   LOS: 24 days   Alm Apo, MD [facility] 06/26/2024, 5:26 PM

## 2024-06-26 NOTE — Progress Notes (Signed)
 17 Days Post-Op   Subjective/Chief Complaint: Patient pleasantly demented.   Objective: Vital signs in last 24 hours: Temp:  [98.6 F (37 C)-99.1 F (37.3 C)] 98.6 F (37 C) (09/13 0459) Pulse Rate:  [84-110] 102 (09/13 0459) Resp:  [18-19] 18 (09/13 0459) BP: (112-126)/(76-98) 125/92 (09/13 0459) SpO2:  [93 %-100 %] 98 % (09/13 0459) Weight:  [67.2 kg] 67.2 kg (09/13 0500) Last BM Date : 06/21/24  Intake/Output from previous day: 09/12 0701 - 09/13 0700 In: 928.2 [I.V.:928.2] Out: 800 [Urine:800] Intake/Output this shift: No intake/output data recorded.   Gen: NAD Resp: nonlabored Card: tachycardic Abd: incision open one area of dehiscence; no mepitel on wound   Lab Results:  Recent Labs    06/25/24 0048 06/26/24 0337  WBC 8.2 7.2  HGB 9.2* 9.4*  HCT 29.2* 30.0*  PLT 614* 556*   BMET Recent Labs    06/24/24 0343 06/26/24 0337  NA 140 140  K 4.2 4.1  CL 105 105  CO2 23 23  GLUCOSE 101* 107*  BUN 26* 24*  CREATININE 0.42* 0.43*  CALCIUM  9.3 9.2   PT/INR No results for input(s): LABPROT, INR in the last 72 hours. ABG No results for input(s): PHART, HCO3 in the last 72 hours.  Invalid input(s): PCO2, PO2  Studies/Results: No results found.  Anti-infectives: Anti-infectives (From admission, onward)    Start     Dose/Rate Route Frequency Ordered Stop   06/17/24 0900  vancomycin  (VANCOCIN ) IVPB 1000 mg/200 mL premix  Status:  Discontinued        1,000 mg 200 mL/hr over 60 Minutes Intravenous Every 24 hours 06/16/24 0839 06/16/24 1103   06/16/24 1600  metroNIDAZOLE  (FLAGYL ) IVPB 500 mg  Status:  Discontinued        500 mg 100 mL/hr over 60 Minutes Intravenous Every 12 hours 06/16/24 0840 06/16/24 1104   06/16/24 1500  ceFEPIme  (MAXIPIME ) 2 g in sodium chloride  0.9 % 100 mL IVPB  Status:  Discontinued        2 g 200 mL/hr over 30 Minutes Intravenous Every 12 hours 06/16/24 0840 06/16/24 1103   06/16/24 1400  meropenem  (MERREM ) 1 g in  sodium chloride  0.9 % 100 mL IVPB  Status:  Discontinued        1 g 200 mL/hr over 30 Minutes Intravenous Every 8 hours 06/16/24 1105 06/23/24 1005   06/16/24 0815  vancomycin  (VANCOREADY) IVPB 1500 mg/300 mL        1,500 mg 150 mL/hr over 120 Minutes Intravenous  Once 06/16/24 0723 06/16/24 1608   06/10/24 0300  ceFEPIme  (MAXIPIME ) 2 g in sodium chloride  0.9 % 100 mL IVPB  Status:  Discontinued        2 g 200 mL/hr over 30 Minutes Intravenous Every 12 hours 06/09/24 1732 06/16/24 0840   06/09/24 2100  metroNIDAZOLE  (FLAGYL ) IVPB 500 mg  Status:  Discontinued        500 mg 100 mL/hr over 60 Minutes Intravenous Every 12 hours 06/09/24 1725 06/16/24 0840   06/09/24 1730  ceFEPIme  (MAXIPIME ) 1 g in sodium chloride  0.9 % 100 mL IVPB  Status:  Discontinued        1 g 200 mL/hr over 30 Minutes Intravenous Every 12 hours 06/09/24 1725 06/09/24 1731   06/09/24 1500  ceFEPIme  (MAXIPIME ) 2 g in sodium chloride  0.9 % 100 mL IVPB        2 g 200 mL/hr over 30 Minutes Intravenous  Once 06/09/24 1454 06/09/24 1743  06/03/24 1130  vancomycin  (VANCOCIN ) IVPB 1000 mg/200 mL premix  Status:  Discontinued        1,000 mg 200 mL/hr over 60 Minutes Intravenous Every 24 hours 06/02/24 1149 06/03/24 0949   06/03/24 0000  ceFEPIme  (MAXIPIME ) 2 g in sodium chloride  0.9 % 100 mL IVPB        2 g 200 mL/hr over 30 Minutes Intravenous Every 12 hours 06/02/24 1149 06/08/24 1553   06/02/24 2200  metroNIDAZOLE  (FLAGYL ) IVPB 500 mg  Status:  Discontinued        500 mg 100 mL/hr over 60 Minutes Intravenous Every 12 hours 06/02/24 1113 06/09/24 1730   06/02/24 1100  ceFEPIme  (MAXIPIME ) 2 g in sodium chloride  0.9 % 100 mL IVPB        2 g 200 mL/hr over 30 Minutes Intravenous  Once 06/02/24 1045 06/02/24 1449   06/02/24 1100  vancomycin  (VANCOCIN ) IVPB 1000 mg/200 mL premix        1,000 mg 200 mL/hr over 60 Minutes Intravenous  Once 06/02/24 1045 06/02/24 1233   06/02/24 1100  metroNIDAZOLE  (FLAGYL ) IVPB 500 mg         500 mg 100 mL/hr over 60 Minutes Intravenous  Once 06/02/24 1045 06/02/24 1818       Assessment/Plan: s/p Procedure(s) with comments: LAPAROSCOPY, DIAGNOSTIC, EXPLORATORY LAPAROTOMY WITH SMALL BOWEL RESECTION (N/A) - Possible laparotomy FEN - reg diet; ok to wean TPN to 1/2 pending results of calorie count  VTE - lovenox  ID - no issues Disposition - discharge planning  Nursing staff trying to obtain Mepitel.  Wound is stable.  Will recheck on Monday.  LOS: 24 days    Debby LABOR Georgene Kopper 06/26/2024

## 2024-06-26 NOTE — Progress Notes (Addendum)
 PHARMACY - TOTAL PARENTERAL NUTRITION CONSULT NOTE   Indication: Prolonged ileus  Patient Measurements: Height: 5' 2 (157.5 cm) Weight: 67.2 kg (148 lb 2.4 oz) IBW/kg (Calculated) : 50.1 TPN AdjBW (KG): 61.3 Body mass index is 27.1 kg/m.   Assessment: Presented to ED with N/V/D and abdominal pain x 3d on 05/31/24. CT negative. Possibly from diverticulitis and placed on Cefepime /Flagyl . Concern for SBO, NGT placed. Continues to have persistent pain and distention. No significant intake since 8/21. Pharmacy consulted to start TPN on 8/26. S/p ex lap with small bowel resection on 8/27.    Glucose / Insulin : No h/o DM -CBGs within goal < 150 -No SSI given in last 24 hrs Electrolytes: Mg 2.3, all others WNL including coCa (9.8) Renal: Scr and BUN WNL Hepatic: LFTs WNL; alb 3.2; TG stable Intake / Output; MIVF:  -LBM 9/8, UOP 1200 ml past 24 hrs  GI Imaging: 8/19 CT: Scattered colonic diverticulosis. No active diverticulitis.   8/22, 8/23, 8/25, 8/26 Abd Xray: all SBO  8/26 CT: severely thickened and inflamed central pelvic loops of small bowel concerning for ischemia. Concern for mesenteric volvulus or closed loop obstruction.  9/2 CT: 1. Interval abdominal surgery. There is a small amount of free air in the upper abdomen likely related to recent surgery. 2. New enhancing fluid collections in the right paracolic gutter and central pelvis worrisome for abscesses. 3. Persistent clustered small bowel loops in the pelvis with wall thickening and mesenteric edema. There is a small amount of air within the mesentery at this level which is new from prior. 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 4. Dilated small bowel loops in the upper abdomen worrisome for small bowel obstruction. 5. Diffuse bladder wall thickening with small amount of air in the bladder. Findings may be related to recent instrumentation or infection. 6. Small amount of simple free fluid in the right upper quadrant and left  upper quadrant. 7. Body wall edema. 8. Aortic atherosclerosis.  GI Surgeries / Procedures:  8/27 Ex lap with small bowel resection 9/3 IR drain placement to abd/pelvic fluid collection  Central access: PICC TPN start date: 8/26  Nutritional Goals: Goal TPN rate is 80 mL/hr (provides 86 g of protein and  1759 kcals per day)  RD Assessment: Kcal 1650-1850 Protein 75-90 g Fluid 1.8 L/day  Current Nutrition:  Ensure Plus High Protein BID Magic cup TIDAC  Patient initially with minimal PO intake which had been improving with assistance from family and staff. TPN was weaned to half rate in anticipation of continued PO intake. Unfortunately, it appears intake may have decreased. Ongoing discussions regarding long term goals.   Plan:  -Continue TPN at 80 mL/hr -Electrolytes in TPN:  Na 50 mEq/L K 40 mEq/L Ca 5 mEq/L Mg 0 mEq/L Phos 10 mmol/L Cl:Ac 1:1 -Add standard MVI and trace elements to TPN -Continue Sensitive q6h SSI -Monitor TPN labs on Mon/Thurs -Continue to follow for plan of care   Stefano MARLA Bologna, PharmD, BCPS Clinical Pharmacist 06/26/2024 8:58 AM

## 2024-06-26 NOTE — Plan of Care (Signed)
  Problem: Fluid Volume: Goal: Hemodynamic stability will improve Outcome: Progressing   Problem: Clinical Measurements: Goal: Diagnostic test results will improve Outcome: Progressing Goal: Signs and symptoms of infection will decrease Outcome: Progressing   Problem: Respiratory: Goal: Ability to maintain adequate ventilation will improve Outcome: Progressing   Problem: Health Behavior/Discharge Planning: Goal: Ability to manage health-related needs will improve Outcome: Progressing   Problem: Clinical Measurements: Goal: Ability to maintain clinical measurements within normal limits will improve Outcome: Progressing Goal: Will remain free from infection Outcome: Progressing Goal: Respiratory complications will improve Outcome: Progressing   Problem: Activity: Goal: Risk for activity intolerance will decrease Outcome: Progressing   Problem: Nutrition: Goal: Adequate nutrition will be maintained Outcome: Progressing   Problem: Coping: Goal: Level of anxiety will decrease Outcome: Progressing   Problem: Safety: Goal: Ability to remain free from injury will improve Outcome: Progressing   Problem: Skin Integrity: Goal: Risk for impaired skin integrity will decrease Outcome: Progressing

## 2024-06-27 DIAGNOSIS — K5700 Diverticulitis of small intestine with perforation and abscess without bleeding: Secondary | ICD-10-CM | POA: Diagnosis not present

## 2024-06-27 LAB — CBC WITH DIFFERENTIAL/PLATELET
Abs Immature Granulocytes: 0.06 K/uL (ref 0.00–0.07)
Basophils Absolute: 0.1 K/uL (ref 0.0–0.1)
Basophils Relative: 1 %
Eosinophils Absolute: 0.4 K/uL (ref 0.0–0.5)
Eosinophils Relative: 4 %
HCT: 28 % — ABNORMAL LOW (ref 36.0–46.0)
Hemoglobin: 8.9 g/dL — ABNORMAL LOW (ref 12.0–15.0)
Immature Granulocytes: 1 %
Lymphocytes Relative: 15 %
Lymphs Abs: 1.5 K/uL (ref 0.7–4.0)
MCH: 28.9 pg (ref 26.0–34.0)
MCHC: 31.8 g/dL (ref 30.0–36.0)
MCV: 90.9 fL (ref 80.0–100.0)
Monocytes Absolute: 0.8 K/uL (ref 0.1–1.0)
Monocytes Relative: 8 %
Neutro Abs: 7.2 K/uL (ref 1.7–7.7)
Neutrophils Relative %: 71 %
Platelets: 476 K/uL — ABNORMAL HIGH (ref 150–400)
RBC: 3.08 MIL/uL — ABNORMAL LOW (ref 3.87–5.11)
RDW: 16.1 % — ABNORMAL HIGH (ref 11.5–15.5)
WBC: 10 K/uL (ref 4.0–10.5)
nRBC: 0 % (ref 0.0–0.2)

## 2024-06-27 LAB — GLUCOSE, CAPILLARY
Glucose-Capillary: 105 mg/dL — ABNORMAL HIGH (ref 70–99)
Glucose-Capillary: 104 mg/dL — ABNORMAL HIGH (ref 70–99)
Glucose-Capillary: 96 mg/dL (ref 70–99)

## 2024-06-27 MED ORDER — QUETIAPINE FUMARATE 25 MG PO TABS
50.0000 mg | ORAL_TABLET | Freq: Every day | ORAL | Status: DC
  Administered 2024-06-27 – 2024-07-07 (×11): 50 mg via ORAL
  Filled 2024-06-27 (×11): qty 2

## 2024-06-27 MED ORDER — TRAVASOL 10 % IV SOLN
INTRAVENOUS | Status: AC
  Filled 2024-06-27: qty 864

## 2024-06-27 NOTE — Progress Notes (Signed)
 PHARMACY - TOTAL PARENTERAL NUTRITION CONSULT NOTE   Indication: Prolonged ileus  Patient Measurements: Height: 5' 2 (157.5 cm) Weight: 68.9 kg (151 lb 14.4 oz) IBW/kg (Calculated) : 50.1 TPN AdjBW (KG): 61.3 Body mass index is 27.78 kg/m.   Assessment: Presented to ED with N/V/D and abdominal pain x 3d on 05/31/24. CT negative. Possibly from diverticulitis and placed on Cefepime /Flagyl . Concern for SBO, NGT placed. Continues to have persistent pain and distention. No significant intake since 8/21. Pharmacy consulted to start TPN on 8/26. S/p ex lap with small bowel resection on 8/27.    Glucose / Insulin : No h/o DM -CBGs within goal < 150 -No SSI given in last 24 hrs Electrolytes: WNL including coCa (9.8) Renal: Scr and BUN WNL Hepatic: LFTs WNL; alb 3.2; TG stable Intake / Output; MIVF:  -LBM documented 9/8, UOP minimal documentation -Very little PO intake  GI Imaging: 8/19 CT: Scattered colonic diverticulosis. No active diverticulitis.   8/22, 8/23, 8/25, 8/26 Abd Xray: all SBO  8/26 CT: severely thickened and inflamed central pelvic loops of small bowel concerning for ischemia. Concern for mesenteric volvulus or closed loop obstruction.  9/2 CT: 1. Interval abdominal surgery. There is a small amount of free air in the upper abdomen likely related to recent surgery. 2. New enhancing fluid collections in the right paracolic gutter and central pelvis worrisome for abscesses. 3. Persistent clustered small bowel loops in the pelvis with wall thickening and mesenteric edema. There is a small amount of air within the mesentery at this level which is new from prior. 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 4. Dilated small bowel loops in the upper abdomen worrisome for small bowel obstruction. 5. Diffuse bladder wall thickening with small amount of air in the bladder. Findings may be related to recent instrumentation or infection. 6. Small amount of simple free fluid in the right upper  quadrant and left upper quadrant. 7. Body wall edema. 8. Aortic atherosclerosis.  GI Surgeries / Procedures:  8/27 Ex lap with small bowel resection 9/3 IR drain placement to abd/pelvic fluid collection  Central access: PICC TPN start date: 8/26  Nutritional Goals: Goal TPN rate is 80 mL/hr (provides 86 g of protein and  1759 kcals per day)  RD Assessment: Kcal 1650-1850 Protein 75-90 g Fluid 1.8 L/day  Current Nutrition:  Ensure Plus High Protein BID Magic cup TIDAC  Patient initially with minimal PO intake which had been improving with assistance from family and staff. TPN was weaned to half rate in anticipation of continued PO intake. Unfortunately, intake has decreased. Ongoing discussions regarding long term goals.   Plan:  -Continue TPN at 80 mL/hr -Electrolytes in TPN:  Na 50 mEq/L K 40 mEq/L Ca 5 mEq/L Mg 2 mEq/L Phos 10 mmol/L Cl:Ac 1:1 -Add standard MVI and trace elements to TPN -Continue Sensitive q6h SSI -Monitor TPN labs on Mon/Thurs -Continue to follow for plan of care   Lindsay Mason, PharmD, BCPS Clinical Pharmacist 06/27/2024 9:55 AM

## 2024-06-27 NOTE — Plan of Care (Signed)

## 2024-06-27 NOTE — Progress Notes (Signed)
 Progress Note    Lindsay Mason   FMW:[id number]  DOB: 1951/01/04  DOA: 06/02/2024     25 PCP: [name], MD  Initial CC: Lethargy, confusion  Hospital Course: Ms. 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    Patient presented to the hospital with positive blood culture with staph species.  Patient had lethargy and worsening mental status.  In the ED patient had slightly low blood pressure.  White blood cell elevated at 13.  CT abdomen and pelvis done on 06/01/2024 revealed no acute finding.  Patient was admitted to the hospital for further evaluation and treatment.  During hospitalization, patient continued to have abdominal pain and distention.  General surgery was consulted.   Repeat CT scanning of the abdomen and pelvis with contrast done on 06/08/2024 revealed severely thickened and inflamed central pelvic loops of small bowel concerning for ischemia. Complex appearance due to indistinct bowel wall margins, surrounding edema, dilated small bowel extending towards these loops, and at least some sense of twisting of bowel along adjacent branches of the superior mesenteric vein concerning for mesenteric volvulus or closed loop obstruction.   Patient eventually underwent exploratory laparotomy with a small bowel resection 06/09/2024.  Patient is currently on TPN, with very poor oral intake.  Repeat CT scan 06/15/2024 revealed new enhancing fluid collections in the right paracolic gutter and central pelvis worrisome for abscesses. IR consulted for drain placement. Underwent drain placement on 9/03.    Assessment & Plan:   Sepsis - resolved  Concern for SB ischemia vs SBO vs volvulus with possible microperforation -Patient initially had leukocytosis with  hypotension and lethargy - repeat CT 8/26 showed: Severely thickened and inflamed central pelvic loops of small bowel concerning for ischemia. Complex appearance due to indistinct bowel wall margins, surrounding edema, dilated small bowel extending towards these loops, and at least some sense of twisting of bowel along adjacent branches of the superior mesenteric vein concerning for mesenteric volvulus or closed loop obstruction. No definite pneumatosis. Small locules of gas along the left inguinal ring and along the inflamed loops of bowel are presumably intraluminal but could conceivably represent contained extraluminal gas from local microperforation. - Followed by surgery and taken to the OR on 06/09/2024 for diagnostic laparoscopy, exploratory laparotomy with small bowel resection --Treated initially with  cefepime  and metronidazole  14 days.  Blood cultures negative. - repeat CT 9/7 showing new fluid collection RLQ 4.1 x 2.6 cm and persistent central pelvic collection measuring 3.6 x 3.5 cm - Remains afebrile with resolved leukocytosis off antibiotics -Intake poor still/again.  Need to document intake every meal.  Daily weights per RD recommendations also - continue full dose TPN - if diet unable to be advanced and/or TPN weaned, may need to consider long term goals more; had family meeting to discuss this more in detail on 9/13 (they will continue more discussions together). Planning to continue watching her intake for now  Metabolic Encephalopathy Alzheimer's dementia - Presumed worsening in setting of acute infection; son states encephalopathy worsens when she is sick -Continue delirium precautions - trial of seroquel ; avoiding over sedating meds as able unless necessary - PRN haldol    Hypophosphatemia - replete as needed   Hypokalemia - Replete as needed   Blood culture contamination - 06/01/2024 culture noted with Staph hominis, presumed contaminant.  No  further workup   COPD  with emphysema  Continue albuterol  inhaler   Hyperlipidemia PAD (peripheral artery disease)  Thoracic aortic atherosclerosis -Holding Lipitor   History of essential hypertension On as needed IV hydralazine    Weakness, deconditioning.   - Seen by PT OT and recommended skilled nursing facility placement    Dysphagia  - s/p SLP eval - continue regular diet    Nutrition Problem: Inadequate oral intake Etiology: lethargy/confusion (dementia, nausea) Signs/Symptoms: meal completion < 50% Interventions: TPN  Estimated body mass index is 28.13 kg/m as calculated from the following:   Height as of this encounter: 5' 2 (1.575 m).   Weight as of this encounter: 69.8 kg.  Interval History:  No events overnight.  Seroquel  seemed to calm her down yesterday.  Timing moved up earlier today to account for sundowning.   Old records reviewed in assessment of this patient  Antimicrobials: Meropenem .  Discontinued 06/23/2024  DVT prophylaxis:  enoxaparin  (LOVENOX ) injection 40 mg Start: 06/16/24 2100 SCDs Start: 06/02/24 1114   Code Status:   Code Status: Full Code  Mobility Assessment (Last 72 Hours)     Mobility Assessment     Row Name 06/27/24 1100 06/26/24 2210 06/26/24 0830 06/25/24 2221 06/25/24 1144   Does the patient have exclusion criteria? No - Perform mobility assessment No - Perform mobility assessment No - Perform mobility assessment No - Perform mobility assessment --   What is the highest level of mobility based on the mobility assessment? Level 3 (Stands with assistance) - Balance while standing  and cannot march in place Level 3 (Stands with assistance) - Balance while standing  and cannot march in place Level 3 (Stands with assistance) - Balance while standing  and cannot march in place Level 3 (Stands with assistance) - Balance while standing  and cannot march in place Level 3 (Stands with assistance) - Balance while standing  and cannot march in place   Is the above  level different from baseline mobility prior to current illness? Yes - Recommend PT order Yes - Recommend PT order Yes - Recommend PT order Yes - Recommend PT order --    Row Name 06/25/24 0859 06/25/24 0500         Does the patient have exclusion criteria? No - Perform mobility assessment No - Perform mobility assessment      What is the highest level of mobility based on the mobility assessment? Level 2 (Chairfast) - Balance while sitting on edge of bed and cannot stand Level 2 (Chairfast) - Balance while sitting on edge of bed and cannot stand      Is the above level different from baseline mobility prior to current illness? Yes - Recommend PT order Yes - Recommend PT order         Barriers to discharge: None Disposition Plan: SNF HH orders placed: N/A Status is: Inpatient  Objective: Blood pressure 110/74, pulse 96, temperature 98.7 F (37.1 C), resp. rate 12, height 5' 2 (1.575 m), weight 68.9 kg, SpO2 100%.  Examination:  Physical Exam Constitutional:      Comments: Pleasant elderly woman lying in bed pleasantly confused  HENT:     Head: Normocephalic and atraumatic.     Mouth/Throat:     Mouth: Mucous membranes are moist.  Eyes:     Extraocular Movements: Extraocular movements intact.  Cardiovascular:     Rate and Rhythm: Normal rate and regular rhythm.  Pulmonary:     Effort: Pulmonary effort is normal. No  respiratory distress.     Breath sounds: Normal breath sounds. No wheezing.  Abdominal:     General: Bowel sounds are normal. There is no distension.     Palpations: Abdomen is soft.     Tenderness: There is no abdominal tenderness.     Comments: Midline abdominal incision noted with surgical dressing in place.  Wound is open with secondary intention  Musculoskeletal:        General: Normal range of motion.     Cervical back: Normal range of motion and neck supple.  Skin:    General: Skin is warm and dry.  Neurological:     Mental Status: Mental status is at  baseline. She is disoriented.      Consultants:  General surgery  Procedures:  06/09/2024: Diagnostic laparoscopy, exploratory laparotomy with small bowel resection  Data Reviewed: Results for orders placed or performed during the hospital encounter of 06/02/24 (from the past 24 hours)  Glucose, capillary     Status: Abnormal   Collection Time: 06/26/24  5:00 PM  Result Value Ref Range   Glucose-Capillary 106 (H) 70 - 99 mg/dL  Glucose, capillary     Status: Abnormal   Collection Time: 06/26/24 11:55 PM  Result Value Ref Range   Glucose-Capillary 105 (H) 70 - 99 mg/dL  CBC with Differential/Platelet     Status: Abnormal   Collection Time: 06/27/24  3:12 AM  Result Value Ref Range   WBC 10.0 4.0 - 10.5 K/uL   RBC 3.08 (L) 3.87 - 5.11 MIL/uL   Hemoglobin 8.9 (L) 12.0 - 15.0 g/dL   HCT 71.9 (L) 63.9 - 53.9 %   MCV 90.9 80.0 - 100.0 fL   MCH 28.9 26.0 - 34.0 pg   MCHC 31.8 30.0 - 36.0 g/dL   RDW 83.8 (H) 88.4 - 84.4 %   Platelets 476 (H) 150 - 400 K/uL   nRBC 0.0 0.0 - 0.2 %   Neutrophils Relative % 71 %   Neutro Abs 7.2 1.7 - 7.7 K/uL   Lymphocytes Relative 15 %   Lymphs Abs 1.5 0.7 - 4.0 K/uL   Monocytes Relative 8 %   Monocytes Absolute 0.8 0.1 - 1.0 K/uL   Eosinophils Relative 4 %   Eosinophils Absolute 0.4 0.0 - 0.5 K/uL   Basophils Relative 1 %   Basophils Absolute 0.1 0.0 - 0.1 K/uL   Immature Granulocytes 1 %   Abs Immature Granulocytes 0.06 0.00 - 0.07 K/uL  Glucose, capillary     Status: Abnormal   Collection Time: 06/27/24  5:19 AM  Result Value Ref Range   Glucose-Capillary 105 (H) 70 - 99 mg/dL  Glucose, capillary     Status: Abnormal   Collection Time: 06/27/24 12:10 PM  Result Value Ref Range   Glucose-Capillary 104 (H) 70 - 99 mg/dL    I have reviewed pertinent nursing notes, vitals, labs, and images as necessary. I have ordered labwork to follow up on as indicated.  I have reviewed the last notes from staff over past 24 hours. I have discussed  patient's care plan and test results with nursing staff, CM/SW, and other staff as appropriate.  Time spent: Greater than 50% of the 55 minute visit was spent in counseling/coordination of care for the patient as laid out in the A&P.   LOS: 25 days   Alm Apo, MD [facility] 06/27/2024, 3:08 PM

## 2024-06-27 NOTE — Plan of Care (Signed)
  Problem: Fluid Volume: Goal: Hemodynamic stability will improve Outcome: Progressing   Problem: Clinical Measurements: Goal: Signs and symptoms of infection will decrease Outcome: Progressing   Problem: Respiratory: Goal: Ability to maintain adequate ventilation will improve Outcome: Progressing   Problem: Education: Goal: Knowledge of General Education information will improve Description: Including pain rating scale, medication(s)/side effects and non-pharmacologic comfort measures Outcome: Progressing   Problem: Health Behavior/Discharge Planning: Goal: Ability to manage health-related needs will improve Outcome: Progressing   Problem: Clinical Measurements: Goal: Ability to maintain clinical measurements within normal limits will improve Outcome: Progressing Goal: Will remain free from infection Outcome: Progressing Goal: Respiratory complications will improve Outcome: Progressing   Problem: Clinical Measurements: Goal: Will remain free from infection Outcome: Progressing   Problem: Activity: Goal: Risk for activity intolerance will decrease Outcome: Progressing   Problem: Elimination: Goal: Will not experience complications related to bowel motility Outcome: Progressing   Problem: Pain Managment: Goal: General experience of comfort will improve and/or be controlled Outcome: Progressing   Problem: Coping: Goal: Level of anxiety will decrease Outcome: Progressing   Problem: Nutrition: Goal: Adequate nutrition will be maintained Outcome: Progressing   Problem: Activity: Goal: Risk for activity intolerance will decrease Outcome: Progressing

## 2024-06-28 DIAGNOSIS — Z7189 Other specified counseling: Secondary | ICD-10-CM | POA: Diagnosis not present

## 2024-06-28 DIAGNOSIS — K5700 Diverticulitis of small intestine with perforation and abscess without bleeding: Secondary | ICD-10-CM | POA: Diagnosis not present

## 2024-06-28 DIAGNOSIS — Z9049 Acquired absence of other specified parts of digestive tract: Secondary | ICD-10-CM | POA: Diagnosis not present

## 2024-06-28 LAB — COMPREHENSIVE METABOLIC PANEL WITH GFR
ALT: 57 U/L — ABNORMAL HIGH (ref 0–44)
AST: 38 U/L (ref 15–41)
Albumin: 3.1 g/dL — ABNORMAL LOW (ref 3.5–5.0)
Alkaline Phosphatase: 115 U/L (ref 38–126)
Anion gap: 10 (ref 5–15)
BUN: 25 mg/dL — ABNORMAL HIGH (ref 8–23)
CO2: 22 mmol/L (ref 22–32)
Calcium: 9.1 mg/dL (ref 8.9–10.3)
Chloride: 109 mmol/L (ref 98–111)
Creatinine, Ser: 0.42 mg/dL — ABNORMAL LOW (ref 0.44–1.00)
GFR, Estimated: >60 mL/min (ref >60)
Glucose, Bld: 102 mg/dL — ABNORMAL HIGH (ref 70–99)
Potassium: 4.1 mmol/L (ref 3.5–5.1)
Sodium: 141 mmol/L (ref 135–145)
Total Bilirubin: 0.4 mg/dL (ref 0.0–1.2)
Total Protein: 5.7 g/dL — ABNORMAL LOW (ref 6.5–8.1)

## 2024-06-28 LAB — MAGNESIUM: Magnesium: 2.1 mg/dL (ref 1.7–2.4)

## 2024-06-28 LAB — CBC WITH DIFFERENTIAL/PLATELET
Abs Immature Granulocytes: 0.04 K/uL (ref 0.00–0.07)
Basophils Absolute: 0.1 K/uL (ref 0.0–0.1)
Basophils Relative: 1 %
Eosinophils Absolute: 0.4 K/uL (ref 0.0–0.5)
Eosinophils Relative: 5 %
HCT: 28.2 % — ABNORMAL LOW (ref 36.0–46.0)
Hemoglobin: 8.7 g/dL — ABNORMAL LOW (ref 12.0–15.0)
Immature Granulocytes: 1 %
Lymphocytes Relative: 23 %
Lymphs Abs: 1.7 K/uL (ref 0.7–4.0)
MCH: 28.2 pg (ref 26.0–34.0)
MCHC: 30.9 g/dL (ref 30.0–36.0)
MCV: 91.6 fL (ref 80.0–100.0)
Monocytes Absolute: 0.6 K/uL (ref 0.1–1.0)
Monocytes Relative: 8 %
Neutro Abs: 4.6 K/uL (ref 1.7–7.7)
Neutrophils Relative %: 62 %
Platelets: 432 K/uL — ABNORMAL HIGH (ref 150–400)
RBC: 3.08 MIL/uL — ABNORMAL LOW (ref 3.87–5.11)
RDW: 16.5 % — ABNORMAL HIGH (ref 11.5–15.5)
WBC: 7.3 K/uL (ref 4.0–10.5)
nRBC: 0 % (ref 0.0–0.2)

## 2024-06-28 LAB — GLUCOSE, CAPILLARY
Glucose-Capillary: 108 mg/dL — ABNORMAL HIGH (ref 70–99)
Glucose-Capillary: 110 mg/dL — ABNORMAL HIGH (ref 70–99)
Glucose-Capillary: 131 mg/dL — ABNORMAL HIGH (ref 70–99)
Glucose-Capillary: 93 mg/dL (ref 70–99)

## 2024-06-28 LAB — TRIGLYCERIDES: Triglycerides: 71 mg/dL (ref <150)

## 2024-06-28 LAB — PHOSPHORUS: Phosphorus: 3.7 mg/dL (ref 2.5–4.6)

## 2024-06-28 MED ORDER — TRAVASOL 10 % IV SOLN
INTRAVENOUS | Status: AC
  Filled 2024-06-28: qty 864

## 2024-06-28 NOTE — Plan of Care (Signed)

## 2024-06-28 NOTE — Progress Notes (Signed)
 [facility] Progress Note  19 Days Post-Op  Subjective: CC: No acute events overnight. Denies pain.  Oriented to self only. Poor PO inake per staff documentation.   Objective: Vital signs in last 24 hours: Temp:  [98.3 F (36.8 C)-98.7 F (37.1 C)] 98.3 F (36.8 C) (09/15 0601) Pulse Rate:  [94-100] 94 (09/15 0601) Resp:  [12-18] 18 (09/15 0601) BP: (110-117)/(74-83) 116/75 (09/15 0601) SpO2:  [99 %-100 %] 99 % (09/15 0601) Weight:  [66.8 kg] 66.8 kg (09/15 0500) Last BM Date : 06/27/24  Intake/Output from previous day: 09/14 0701 - 09/15 0700 In: 1270 [P.O.:300; I.V.:970] Out: 1050 [Urine:1050] Intake/Output this shift: Total I/O In: 60 [P.O.:60] Out: -   PE: Gen:  Alert, NAD, pleasant Card:  Regular rate and rhythm Pulm:  Normal effort Abd: Soft, non-tender, non-distended, midline dressing c/d/i Skin: warm and dry, no rashes  Psych: A&Ox3   Lab Results:  Recent Labs    06/27/24 0312 06/28/24 0215  WBC 10.0 7.3  HGB 8.9* 8.7*  HCT 28.0* 28.2*  PLT 476* 432*   BMET Recent Labs    06/26/24 0337 06/28/24 0215  NA 140 141  K 4.1 4.1  CL 105 109  CO2 23 22  GLUCOSE 107* 102*  BUN 24* 25*  CREATININE 0.43* 0.42*  CALCIUM  9.2 9.1   PT/INR No results for input(s): LABPROT, INR in the last 72 hours. CMP     Component Value Date/Time   NA 141 06/28/2024 0215   NA 144 03/02/2024 1554   K 4.1 06/28/2024 0215   CL 109 06/28/2024 0215   CO2 22 06/28/2024 0215   GLUCOSE 102 (H) 06/28/2024 0215   BUN 25 (H) 06/28/2024 0215   BUN 20 03/02/2024 1554   CREATININE 0.42 (L) 06/28/2024 0215   CALCIUM  9.1 06/28/2024 0215   PROT 5.7 (L) 06/28/2024 0215   PROT 6.1 03/02/2024 1554   ALBUMIN  3.1 (L) 06/28/2024 0215   ALBUMIN  3.9 03/02/2024 1554   AST 38 06/28/2024 0215   ALT 57 (H) 06/28/2024 0215   ALKPHOS 115 06/28/2024 0215   BILITOT 0.4 06/28/2024 0215   BILITOT 0.2 03/02/2024 1554   GFRNONAA >60 06/28/2024 0215   GFRAA >60  01/22/2020 0226   Lipase     Component Value Date/Time   LIPASE 27 05/31/2024 2054       Studies/Results: No results found.  Anti-infectives: Anti-infectives (From admission, onward)    Start     Dose/Rate Route Frequency Ordered Stop   06/17/24 0900  vancomycin  (VANCOCIN ) IVPB 1000 mg/200 mL premix  Status:  Discontinued        1,000 mg 200 mL/hr over 60 Minutes Intravenous Every 24 hours 06/16/24 0839 06/16/24 1103   06/16/24 1600  metroNIDAZOLE  (FLAGYL ) IVPB 500 mg  Status:  Discontinued        500 mg 100 mL/hr over 60 Minutes Intravenous Every 12 hours 06/16/24 0840 06/16/24 1104   06/16/24 1500  ceFEPIme  (MAXIPIME ) 2 g in sodium chloride  0.9 % 100 mL IVPB  Status:  Discontinued        2 g 200 mL/hr over 30 Minutes Intravenous Every 12 hours 06/16/24 0840 06/16/24 1103   06/16/24 1400  meropenem  (MERREM ) 1 g in sodium chloride  0.9 % 100 mL IVPB  Status:  Discontinued        1 g 200 mL/hr over 30 Minutes Intravenous Every 8 hours 06/16/24 1105 06/23/24 1005   06/16/24 0815  vancomycin  (VANCOREADY) IVPB 1500 mg/300 mL  1,500 mg 150 mL/hr over 120 Minutes Intravenous  Once 06/16/24 0723 06/16/24 1608   06/10/24 0300  ceFEPIme  (MAXIPIME ) 2 g in sodium chloride  0.9 % 100 mL IVPB  Status:  Discontinued        2 g 200 mL/hr over 30 Minutes Intravenous Every 12 hours 06/09/24 1732 06/16/24 0840   06/09/24 2100  metroNIDAZOLE  (FLAGYL ) IVPB 500 mg  Status:  Discontinued        500 mg 100 mL/hr over 60 Minutes Intravenous Every 12 hours 06/09/24 1725 06/16/24 0840   06/09/24 1730  ceFEPIme  (MAXIPIME ) 1 g in sodium chloride  0.9 % 100 mL IVPB  Status:  Discontinued        1 g 200 mL/hr over 30 Minutes Intravenous Every 12 hours 06/09/24 1725 06/09/24 1731   06/09/24 1500  ceFEPIme  (MAXIPIME ) 2 g in sodium chloride  0.9 % 100 mL IVPB        2 g 200 mL/hr over 30 Minutes Intravenous  Once 06/09/24 1454 06/09/24 1743   06/03/24 1130  vancomycin  (VANCOCIN ) IVPB 1000 mg/200 mL  premix  Status:  Discontinued        1,000 mg 200 mL/hr over 60 Minutes Intravenous Every 24 hours 06/02/24 1149 06/03/24 0949   06/03/24 0000  ceFEPIme  (MAXIPIME ) 2 g in sodium chloride  0.9 % 100 mL IVPB        2 g 200 mL/hr over 30 Minutes Intravenous Every 12 hours 06/02/24 1149 06/08/24 1553   06/02/24 2200  metroNIDAZOLE  (FLAGYL ) IVPB 500 mg  Status:  Discontinued        500 mg 100 mL/hr over 60 Minutes Intravenous Every 12 hours 06/02/24 1113 06/09/24 1730   06/02/24 1100  ceFEPIme  (MAXIPIME ) 2 g in sodium chloride  0.9 % 100 mL IVPB        2 g 200 mL/hr over 30 Minutes Intravenous  Once 06/02/24 1045 06/02/24 1449   06/02/24 1100  vancomycin  (VANCOCIN ) IVPB 1000 mg/200 mL premix        1,000 mg 200 mL/hr over 60 Minutes Intravenous  Once 06/02/24 1045 06/02/24 1233   06/02/24 1100  metroNIDAZOLE  (FLAGYL ) IVPB 500 mg        500 mg 100 mL/hr over 60 Minutes Intravenous  Once 06/02/24 1045 06/02/24 1818        Assessment/Plan   s/p Procedure(s): LAPAROSCOPY, DIAGNOSTIC, EXPLORATORY LAPAROTOMY WITH SMALL BOWEL RESECTION 06/09/2024  - POD#19 AFVSS, WBC 7.3 - continue dressing  changes as ordered; will check wound tomorrow - PT/OT    FEN - reg diet; not eating enough calories at present to stop TPN, may need to consider GOC VTE - lovenox  ID - no issues Disposition - discharge planning      LOS: 26 days   I reviewed nursing notes, hospitalist notes, last 24 h vitals and pain scores, last 48 h intake and output, last 24 h labs and trends, and last 24 h imaging results.  This care required moderate level of medical decision making.   Almarie Pringle, PA-C [facility] Please see Amion for pager number during day hours 7:00am-4:30pm

## 2024-06-28 NOTE — Progress Notes (Signed)
 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    Patient presented to the hospital with positive blood culture with staph species.  Patient had lethargy and worsening mental status.  In the ED patient had slightly low blood pressure.  White blood cell elevated at 13.  CT abdomen and pelvis done on 06/01/2024 revealed no acute finding.  Patient was admitted to the hospital for further evaluation and treatment.  During hospitalization, patient continued to have abdominal pain and distention.  General surgery was consulted.   Repeat CT scanning of the abdomen and pelvis with contrast done on 06/08/2024 revealed severely thickened and inflamed central pelvic loops of small bowel concerning for ischemia. Complex appearance due to indistinct bowel wall margins, surrounding edema, dilated small bowel extending towards these loops, and at least some sense of twisting of bowel along adjacent branches of the superior mesenteric vein concerning for mesenteric volvulus or closed loop obstruction.   Patient eventually underwent exploratory laparotomy with a small bowel resection 06/09/2024.  Patient is currently on TPN, with very poor oral intake.  Repeat CT scan 06/15/2024 revealed new enhancing fluid collections in the right paracolic gutter and central pelvis worrisome for abscesses. IR consulted for drain placement. Underwent drain placement on 9/03.    Assessment & Plan:   Sepsis - resolved  Concern for SB ischemia vs SBO vs volvulus with possible microperforation -Patient initially had leukocytosis with  hypotension and lethargy - repeat CT 8/26 showed: Severely thickened and inflamed central pelvic loops of small bowel concerning for ischemia. Complex appearance due to indistinct bowel wall margins, surrounding edema, dilated small bowel extending towards these loops, and at least some sense of twisting of bowel along adjacent branches of the superior mesenteric vein concerning for mesenteric volvulus or closed loop obstruction. No definite pneumatosis. Small locules of gas along the left inguinal ring and along the inflamed loops of bowel are presumably intraluminal but could conceivably represent contained extraluminal gas from local microperforation. - Followed by surgery and taken to the OR on 06/09/2024 for diagnostic laparoscopy, exploratory laparotomy with small bowel resection --Treated initially with  cefepime  and metronidazole  14 days.  Blood cultures negative. - repeat CT 9/7 showing new fluid collection RLQ 4.1 x 2.6 cm and persistent central pelvic collection measuring 3.6 x 3.5 cm - Remains afebrile with resolved leukocytosis off antibiotics -Intake poor still/again.  Need to document intake every meal.  Daily weights per RD recommendations also - continue full dose TPN - if diet unable to be advanced and/or TPN weaned, may need to consider long term goals more; had family meeting to discuss this more in detail on 9/13 (they will continue more discussions together). Planning to continue watching her intake for now - Family wishing to also discuss GOC with palliative care. Consult placed. I have strongly recommended against G-tube placement given advanced dementia and co-morbidities along with lack of mortality benefit with PEG  Metabolic Encephalopathy Alzheimer's dementia - Presumed worsening in setting of acute infection; son states encephalopathy worsens when she is sick -Continue delirium precautions - trial of seroquel ; avoiding over sedating meds as able unless necessary -  PRN  haldol    Hypophosphatemia - repleted   Hypokalemia - Repleted   Blood culture contamination - 06/01/2024 culture noted with Staph hominis, presumed contaminant.  No further workup   COPD with emphysema  Continue albuterol  inhaler   Hyperlipidemia PAD (peripheral artery disease)  Thoracic aortic atherosclerosis -Holding Lipitor   History of essential hypertension On as needed IV hydralazine    Weakness, deconditioning.   - Seen by PT OT and recommended skilled nursing facility placement    Dysphagia  - s/p SLP eval - continue regular diet    Nutrition Problem: Inadequate oral intake Etiology: lethargy/confusion (dementia, nausea) Signs/Symptoms: meal completion < 50% Interventions: TPN  Estimated body mass index is 28.13 kg/m as calculated from the following:   Height as of this encounter: 5' 2 (1.575 m).   Weight as of this encounter: 69.8 kg.  Interval History:  No events overnight.  Seroquel  seems to be working.  Sounds like she is starting to sundown just as Seroquel  is being given and then she rests well overnight and is lucid in the morning.  Spoke with son and husband in the hall this morning as well.  We plan to have palliative care come speak more with family to help guide further discussions.  Everyone seems to be on the fence about stopping TPN however son and spouse do understand that his likely the wish she would have wanted given her current state.   Old records reviewed in assessment of this patient  Antimicrobials: Meropenem .  Discontinued 06/23/2024  DVT prophylaxis:  enoxaparin  (LOVENOX ) injection 40 mg Start: 06/16/24 2100 SCDs Start: 06/02/24 1114   Code Status:   Code Status: Full Code  Mobility Assessment (Last 72 Hours)     Mobility Assessment     Row Name 06/27/24 2045 06/27/24 1100 06/26/24 2210 06/26/24 0830 06/25/24 2221   Does the patient have exclusion criteria? No - Perform mobility assessment No - Perform mobility assessment No  - Perform mobility assessment No - Perform mobility assessment No - Perform mobility assessment   What is the highest level of mobility based on the mobility assessment? Level 3 (Stands with assistance) - Balance while standing  and cannot march in place Level 3 (Stands with assistance) - Balance while standing  and cannot march in place Level 3 (Stands with assistance) - Balance while standing  and cannot march in place Level 3 (Stands with assistance) - Balance while standing  and cannot march in place Level 3 (Stands with assistance) - Balance while standing  and cannot march in place   Is the above level different from baseline mobility prior to current illness? Yes - Recommend PT order Yes - Recommend PT order Yes - Recommend PT order Yes - Recommend PT order Yes - Recommend PT order      Barriers to discharge: None Disposition Plan: SNF vs residential hospice ; pending GOC discussions more with PCM HH orders placed: N/A Status is: Inpatient  Objective: Blood pressure (!) 114/95, pulse (!) 107, temperature 98.7 F (37.1 C), resp. rate 15, height 5' 2 (1.575 m), weight 66.8 kg, SpO2 92%.  Examination:  Physical Exam Constitutional:      Comments: Pleasant elderly woman lying in bed pleasantly confused  HENT:     Head: Normocephalic and atraumatic.     Mouth/Throat:     Mouth: Mucous membranes are moist.  Eyes:     Extraocular Movements: Extraocular movements intact.  Cardiovascular:     Rate and Rhythm: Normal rate and  regular rhythm.  Pulmonary:     Effort: Pulmonary effort is normal. No respiratory distress.     Breath sounds: Normal breath sounds. No wheezing.  Abdominal:     General: Bowel sounds are normal. There is no distension.     Palpations: Abdomen is soft.     Tenderness: There is no abdominal tenderness.     Comments: Midline abdominal incision noted with surgical dressing in place.  Wound is open with secondary intention  Musculoskeletal:        General: Normal  range of motion.     Cervical back: Normal range of motion and neck supple.  Skin:    General: Skin is warm and dry.  Neurological:     Mental Status: Mental status is at baseline. She is disoriented.      Consultants:  General surgery Palliative care   Procedures:  06/09/2024: Diagnostic laparoscopy, exploratory laparotomy with small bowel resection  Data Reviewed: Results for orders placed or performed during the hospital encounter of 06/02/24 (from the past 24 hours)  Glucose, capillary     Status: None   Collection Time: 06/27/24  5:44 PM  Result Value Ref Range   Glucose-Capillary 96 70 - 99 mg/dL  Glucose, capillary     Status: Abnormal   Collection Time: 06/28/24 12:09 AM  Result Value Ref Range   Glucose-Capillary 110 (H) 70 - 99 mg/dL  Comprehensive metabolic panel     Status: Abnormal   Collection Time: 06/28/24  2:15 AM  Result Value Ref Range   Sodium 141 135 - 145 mmol/L   Potassium 4.1 3.5 - 5.1 mmol/L   Chloride 109 98 - 111 mmol/L   CO2 22 22 - 32 mmol/L   Glucose, Bld 102 (H) 70 - 99 mg/dL   BUN 25 (H) 8 - 23 mg/dL   Creatinine, Ser 9.57 (L) 0.44 - 1.00 mg/dL   Calcium  9.1 8.9 - 10.3 mg/dL   Total Protein 5.7 (L) 6.5 - 8.1 g/dL   Albumin  3.1 (L) 3.5 - 5.0 g/dL   AST 38 15 - 41 U/L   ALT 57 (H) 0 - 44 U/L   Alkaline Phosphatase 115 38 - 126 U/L   Total Bilirubin 0.4 0.0 - 1.2 mg/dL   GFR, Estimated >39 >39 mL/min   Anion gap 10 5 - 15  Magnesium      Status: None   Collection Time: 06/28/24  2:15 AM  Result Value Ref Range   Magnesium  2.1 1.7 - 2.4 mg/dL  Phosphorus     Status: None   Collection Time: 06/28/24  2:15 AM  Result Value Ref Range   Phosphorus 3.7 2.5 - 4.6 mg/dL  Triglycerides     Status: None   Collection Time: 06/28/24  2:15 AM  Result Value Ref Range   Triglycerides 71 <150 mg/dL  CBC with Differential/Platelet     Status: Abnormal   Collection Time: 06/28/24  2:15 AM  Result Value Ref Range   WBC 7.3 4.0 - 10.5 K/uL   RBC  3.08 (L) 3.87 - 5.11 MIL/uL   Hemoglobin 8.7 (L) 12.0 - 15.0 g/dL   HCT 71.7 (L) 63.9 - 53.9 %   MCV 91.6 80.0 - 100.0 fL   MCH 28.2 26.0 - 34.0 pg   MCHC 30.9 30.0 - 36.0 g/dL   RDW 83.4 (H) 88.4 - 84.4 %   Platelets 432 (H) 150 - 400 K/uL   nRBC 0.0 0.0 - 0.2 %   Neutrophils Relative %  62 %   Neutro Abs 4.6 1.7 - 7.7 K/uL   Lymphocytes Relative 23 %   Lymphs Abs 1.7 0.7 - 4.0 K/uL   Monocytes Relative 8 %   Monocytes Absolute 0.6 0.1 - 1.0 K/uL   Eosinophils Relative 5 %   Eosinophils Absolute 0.4 0.0 - 0.5 K/uL   Basophils Relative 1 %   Basophils Absolute 0.1 0.0 - 0.1 K/uL   Immature Granulocytes 1 %   Abs Immature Granulocytes 0.04 0.00 - 0.07 K/uL  Glucose, capillary     Status: Abnormal   Collection Time: 06/28/24  5:58 AM  Result Value Ref Range   Glucose-Capillary 108 (H) 70 - 99 mg/dL  Glucose, capillary     Status: Abnormal   Collection Time: 06/28/24 11:44 AM  Result Value Ref Range   Glucose-Capillary 131 (H) 70 - 99 mg/dL    I have reviewed pertinent nursing notes, vitals, labs, and images as necessary. I have ordered labwork to follow up on as indicated.  I have reviewed the last notes from staff over past 24 hours. I have discussed patient's care plan and test results with nursing staff, CM/SW, and other staff as appropriate.  Time spent: Greater than 50% of the 55 minute visit was spent in counseling/coordination of care for the patient as laid out in the A&P.   LOS: 26 days   Alm Apo, MD [facility] 06/28/2024, 3:17 PM

## 2024-06-28 NOTE — Progress Notes (Signed)
 PHARMACY - TOTAL PARENTERAL NUTRITION CONSULT NOTE   Indication: Prolonged ileus  Patient Measurements: Height: 5' 2 (157.5 cm) Weight: 66.8 kg (147 lb 4.3 oz) IBW/kg (Calculated) : 50.1 TPN AdjBW (KG): 61.3 Body mass index is 26.94 kg/m.   Assessment: Presented to ED with N/V/D and abdominal pain x 3d on 05/31/24. CT negative. Possibly from diverticulitis and placed on Cefepime /Flagyl . Concern for SBO, NGT placed. Continues to have persistent pain and distention. No significant intake since 8/21. Pharmacy consulted to start TPN on 8/26. S/p ex lap with small bowel resection on 8/27.    Glucose / Insulin : No h/o DM -CBGs within goal < 150 -No SSI given in last 24 hrs Electrolytes: WNL including coCa (9.7) Renal: Scr and BUN WNL Hepatic: LFTs WNL; alb 3.1; TG stable Intake / Output; MIVF:  -LBM documented 9/4, UOP minimal documentation -Very little PO intake  GI Imaging: 8/19 CT: Scattered colonic diverticulosis. No active diverticulitis.   8/22, 8/23, 8/25, 8/26 Abd Xray: all SBO  8/26 CT: severely thickened and inflamed central pelvic loops of small bowel concerning for ischemia. Concern for mesenteric volvulus or closed loop obstruction.  9/2 CT: 1. Interval abdominal surgery. There is a small amount of free air in the upper abdomen likely related to recent surgery. 2. New enhancing fluid collections in the right paracolic gutter and central pelvis worrisome for abscesses. 3. Persistent clustered small bowel loops in the pelvis with wall thickening and mesenteric edema. There is a small amount of air within the mesentery at this level which is new from prior. 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 4. Dilated small bowel loops in the upper abdomen worrisome for small bowel obstruction. 5. Diffuse bladder wall thickening with small amount of air in the bladder. Findings may be related to recent instrumentation or infection. 6. Small amount of simple free fluid in the right upper  quadrant and left upper quadrant. 7. Body wall edema. 8. Aortic atherosclerosis.  GI Surgeries / Procedures:  8/27 Ex lap with small bowel resection 9/3 IR drain placement to abd/pelvic fluid collection  Central access: PICC TPN start date: 8/26  Nutritional Goals: Goal TPN rate is 80 mL/hr (provides 86 g of protein and  1759 kcals per day)  RD Assessment: Kcal 1650-1850 Protein 75-90 g Fluid 1.8 L/day  Current Nutrition:  Ensure Plus High Protein BID Magic cup TIDAC  Patient initially with minimal PO intake which had been improving with assistance from family and staff. TPN was weaned to half rate in anticipation of continued PO intake. Unfortunately, intake has decreased. Intake has remained low at ~ 20-25% of some meals. Ongoing discussions with family regarding long term goals.   Plan:  -Continue TPN at 80 mL/hr -Electrolytes in TPN:  Na 50 mEq/L K 40 mEq/L Ca 5 mEq/L Mg 2 mEq/L Phos 10 mmol/L Cl:Ac 1:1 -Add standard MVI and trace elements to TPN -Continue Sensitive q6h SSI -Monitor TPN labs on Mon/Thurs -Continue to follow for plan of care   Stefano MARLA Bologna, PharmD, BCPS Clinical Pharmacist 06/28/2024 10:56 AM

## 2024-06-29 DIAGNOSIS — Z9049 Acquired absence of other specified parts of digestive tract: Secondary | ICD-10-CM | POA: Diagnosis not present

## 2024-06-29 DIAGNOSIS — K5700 Diverticulitis of small intestine with perforation and abscess without bleeding: Secondary | ICD-10-CM | POA: Diagnosis not present

## 2024-06-29 DIAGNOSIS — R531 Weakness: Secondary | ICD-10-CM | POA: Diagnosis not present

## 2024-06-29 DIAGNOSIS — G300 Alzheimer's disease with early onset: Secondary | ICD-10-CM | POA: Diagnosis not present

## 2024-06-29 DIAGNOSIS — Z515 Encounter for palliative care: Secondary | ICD-10-CM | POA: Diagnosis not present

## 2024-06-29 DIAGNOSIS — Z7189 Other specified counseling: Secondary | ICD-10-CM | POA: Diagnosis not present

## 2024-06-29 LAB — GLUCOSE, CAPILLARY
Glucose-Capillary: 103 mg/dL — ABNORMAL HIGH (ref 70–99)
Glucose-Capillary: 108 mg/dL — ABNORMAL HIGH (ref 70–99)
Glucose-Capillary: 83 mg/dL (ref 70–99)
Glucose-Capillary: 92 mg/dL (ref 70–99)
Glucose-Capillary: 97 mg/dL (ref 70–99)

## 2024-06-29 LAB — SARS CORONAVIRUS 2 BY RT PCR: SARS Coronavirus 2 by RT PCR: NEGATIVE

## 2024-06-29 MED ORDER — TRAVASOL 10 % IV SOLN
INTRAVENOUS | Status: AC
  Filled 2024-06-29: qty 864

## 2024-06-29 MED ORDER — LOPERAMIDE HCL 2 MG PO CAPS
2.0000 mg | ORAL_CAPSULE | ORAL | Status: DC | PRN
  Administered 2024-06-29: 2 mg via ORAL
  Filled 2024-06-29: qty 1

## 2024-06-29 MED ORDER — ACETAMINOPHEN 160 MG/5ML PO SOLN
650.0000 mg | ORAL | Status: DC | PRN
  Administered 2024-06-29 – 2024-07-03 (×4): 650 mg via ORAL
  Filled 2024-06-29 (×5): qty 20.3

## 2024-06-29 MED ORDER — GERHARDT'S BUTT CREAM
TOPICAL_CREAM | Freq: Two times a day (BID) | CUTANEOUS | Status: DC
  Administered 2024-07-06: 1 via TOPICAL
  Filled 2024-06-29 (×2): qty 60

## 2024-06-29 NOTE — Progress Notes (Addendum)
 Physical Therapy Treatment Patient Details Name: Lindsay Mason MRN: [id number] DOB: [birth year] Today's Date: 06/29/2024   History of Present Illness Pt is 73 yo female presented on 06/02/24  with + blood culture with staph species, lethargy, and worsening mental status. Pt admitted with SIRS, abdominal pain, N/V likely secondary to acute diverticulitis, and SBO vs ileus.  Pt with NG tube and surgery following. s/p exploratory lap with SB resection 2* ischemic bowel 06/09/24.  Drain placement for pelvic abscess 06/16/24. Currently on TPN.  Pt also with metabolic encephalopathy on background of Alzheimer's dementia.  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  Goals updated.  Does need mod multimodal cues for all transfers.  Family encouragement helped pt participate.  She ambulated 51' and 15' in room, pt being tested for COVID and unable to tolerate mask for hallway ambulation.  Will cont to progress as able. At this time patient will benefit from continued inpatient follow up therapy, <3 hours/day at d/c.  However, with pt's dementia she potentially may be better in home environment if her mobility and medical status continues to improve and family able to provide 24 hr assist and support - will continue to assess..   If plan is discharge home, recommend the following: A lot of help with bathing/dressing/bathroom;Assistance with cooking/housework;Assist for transportation;Help with stairs or ramp for entrance;Direct supervision/assist for medications management;Direct supervision/assist for financial management;Supervision due to cognitive status;A lot of help with walking and/or transfers   Can travel by private vehicle         Equipment Recommendations  None recommended by PT    Recommendations for Other Services       Precautions / Restrictions Precautions Precautions: Fall Other Brace: abdominal binder     Mobility  Bed Mobility Overal bed mobility: Needs Assistance Bed Mobility: Supine to Sit     Supine to sit: Contact guard, HOB elevated, Used rails     General bed mobility comments: increased cues but no physical assist    Transfers Overall transfer level: Needs assistance Equipment used: Rolling walker (2 wheels), None Transfers: Sit to/from Stand Sit to Stand: Min assist           General transfer comment: STS x 2 during session with and w/o RW; increased time and intiation cues, light min A to steady    Ambulation/Gait Ambulation/Gait assistance: Min assist Gait Distance (Feet): 30 Feet (30' then 15') Assistive device: Rolling walker (2 wheels), 1 person hand held assist Gait Pattern/deviations: Step-to pattern, Decreased stride length Gait velocity: decreased     General Gait Details: Initially starting with RW but then pt leaving behind, doing better with HHA.  Pt currenlty on airborne precautions being tested for COVID - she was uncomfortable wearing mask in hallway, so ambulated in room.  Easily distracted needing cues to redirect.  Encouragement from family helped.   Stairs             Wheelchair Mobility     Tilt Bed    Modified Rankin (Stroke Patients Only)       Balance Overall balance assessment: Needs assistance Sitting-balance support: No upper extremity supported, Feet supported Sitting balance-Leahy Scale: Good     Standing balance support: Single extremity supported Standing balance-Leahy Scale: Poor Standing balance  comment: needing at least single UE support                            Communication    Cognition Arousal: Alert Behavior During Therapy: Anxious   PT - Cognitive impairments: History of cognitive impairments,  Orientation, Awareness, Memory, Attention, Initiation, Sequencing, Problem solving, Safety/Judgement   Orientation impairments: Person                   PT - Cognition Comments: Oriented to self and family only; follows simple commands inconsistenlty -improved with multimodal cues, can get anxious easily at times Following commands: Impaired Following commands impaired: Follows one step commands inconsistently    Cueing Cueing Techniques: Verbal cues, Gestural cues, Tactile cues, Visual cues  Exercises      General Comments General comments (skin integrity, edema, etc.): VSS      Pertinent Vitals/Pain Pain Assessment Pain Assessment: Faces Pain Score: 2  Pain Location: back, throat Pain Descriptors / Indicators: Discomfort Pain Intervention(s): Limited activity within patient's tolerance, Monitored during session, Repositioned    Home Living                          Prior Function            PT Goals (current goals can now be found in the care plan section) Acute Rehab PT Goals Time For Goal Achievement: 07/13/24 Progress towards PT goals: Progressing toward goals    Frequency    Min 2X/week      PT Plan      Co-evaluation              AM-PAC PT 6 Clicks Mobility   Outcome Measure  Help needed turning from your back to your side while in a flat bed without using bedrails?: A Little Help needed moving from lying on your back to sitting on the side of a flat bed without using bedrails?: (S) A Lot (mod cues for all) Help needed moving to and from a bed to a chair (including a wheelchair)?: A Lot Help needed standing up from a chair using your arms (e.g., wheelchair or bedside chair)?: A Lot Help needed to walk in hospital room?: A Lot Help needed climbing 3-5 steps with a railing? : A Lot 6 Click Score: 13    End of Session Equipment Utilized During Treatment: Gait belt Activity Tolerance: Patient tolerated treatment well Patient  left: with call bell/phone within reach;with chair alarm set;in chair;with family/visitor present Nurse Communication: Mobility status;Other (comment) (wants tylenol ) PT Visit Diagnosis: Difficulty in walking, not elsewhere classified (R26.2);Unsteadiness on feet (R26.81)     Time: 8376-8349 PT Time Calculation (min) (ACUTE ONLY): 27 min  Charges:    $Gait Training: 8-22 mins $Therapeutic Activity: 8-22 mins PT General Charges $$ ACUTE PT VISIT: 1 Visit                     Benjiman, PT Acute Rehab Services [facility] [phone]    Benjiman VEAR Mulberry 06/29/2024, 5:08 PM

## 2024-06-29 NOTE — Progress Notes (Signed)
 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    Patient presented to the hospital with positive blood culture with staph species.  Patient had lethargy and worsening mental status.  In the ED patient had slightly low blood pressure.  White blood cell elevated at 13.  CT abdomen and pelvis done on 06/01/2024 revealed no acute finding.  Patient was admitted to the hospital for further evaluation and treatment.  During hospitalization, patient continued to have abdominal pain and distention.  General surgery was consulted.   Repeat CT scanning of the abdomen and pelvis with contrast done on 06/08/2024 revealed severely thickened and inflamed central pelvic loops of small bowel concerning for ischemia. Complex appearance due to indistinct bowel wall margins, surrounding edema, dilated small bowel extending towards these loops, and at least some sense of twisting of bowel along adjacent branches of the superior mesenteric vein concerning for mesenteric volvulus or closed loop obstruction.   Patient eventually underwent exploratory laparotomy with a small bowel resection 06/09/2024.  Patient is currently on TPN, with very poor oral intake.  Repeat CT scan 06/15/2024 revealed new enhancing fluid collections in the right paracolic gutter and central pelvis worrisome for abscesses. IR consulted for drain placement. Underwent drain placement on 9/03.    Assessment & Plan:   Sepsis - resolved  Concern for SB ischemia vs SBO vs volvulus with possible microperforation -Patient initially had leukocytosis with  hypotension and lethargy - repeat CT 8/26 showed: Severely thickened and inflamed central pelvic loops of small bowel concerning for ischemia. Complex appearance due to indistinct bowel wall margins, surrounding edema, dilated small bowel extending towards these loops, and at least some sense of twisting of bowel along adjacent branches of the superior mesenteric vein concerning for mesenteric volvulus or closed loop obstruction. No definite pneumatosis. Small locules of gas along the left inguinal ring and along the inflamed loops of bowel are presumably intraluminal but could conceivably represent contained extraluminal gas from local microperforation. - Followed by surgery and taken to the OR on 06/09/2024 for diagnostic laparoscopy, exploratory laparotomy with small bowel resection --Treated initially with  cefepime  and metronidazole  14 days.  Blood cultures negative. - repeat CT 9/7 showing new fluid collection RLQ 4.1 x 2.6 cm and persistent central pelvic collection measuring 3.6 x 3.5 cm - Remains afebrile with resolved leukocytosis off antibiotics -Intake poor still/again.  Need to document intake every meal.  Daily weights per RD recommendations also (still 20-25% intake for 1-2 meals a day) - continue full dose TPN - if diet unable to be advanced and/or TPN weaned, may need to consider long term goals more; had family meeting to discuss this more in detail on 9/13 (they will continue more discussions together). Planning to continue watching her intake for now - Family wishing to also discuss GOC with palliative care. Formal meeting planned for 9/18. I have strongly recommended against G-tube placement given advanced dementia and co-morbidities along with lack of mortality benefit with PEG  Metabolic Encephalopathy Alzheimer's dementia - Presumed worsening in setting of acute infection; son states encephalopathy worsens when she is sick -Continue delirium precautions - trial  of seroquel ;  avoiding over sedating meds as able unless necessary - PRN haldol    Hypophosphatemia - repleted   Hypokalemia - Repleted   Blood culture contamination - 06/01/2024 culture noted with Staph hominis, presumed contaminant.  No further workup   COPD with emphysema  Continue albuterol  inhaler   Hyperlipidemia PAD (peripheral artery disease)  Thoracic aortic atherosclerosis -Holding Lipitor   History of essential hypertension On as needed IV hydralazine    Weakness, deconditioning.   - Seen by PT OT and recommended skilled nursing facility placement    Dysphagia  - s/p SLP eval - continue regular diet    Nutrition Problem: Inadequate oral intake Etiology: lethargy/confusion (dementia, nausea) Signs/Symptoms: meal completion < 50% Interventions: TPN  Estimated body mass index is 28.13 kg/m as calculated from the following:   Height as of this encounter: 5' 2 (1.575 m).   Weight as of this encounter: 69.8 kg.  Interval History:  Doing okay this morning.  Ate a little bit more breakfast than she has previously been eating but otherwise meal intake still looks to be about 20 to 25% and eating only 1-2 meals a day. Palliative care planning for family meeting on Thursday to discuss TPN and next steps.  Old records reviewed in assessment of this patient  Antimicrobials: Meropenem .  Discontinued 06/23/2024  DVT prophylaxis:  enoxaparin  (LOVENOX ) injection 40 mg Start: 06/16/24 2100 SCDs Start: 06/02/24 1114   Code Status:   Code Status: Full Code  Mobility Assessment (Last 72 Hours)     Mobility Assessment     Row Name 06/29/24 1449 06/28/24 1953 06/28/24 0800 06/27/24 2045 06/27/24 1100   Does the patient have exclusion criteria? No - Perform mobility assessment No - Perform mobility assessment No - Perform mobility assessment No - Perform mobility assessment No - Perform mobility assessment   What is the highest level of mobility based on the mobility assessment? Level  4 (Ambulates with assistance) - Balance while stepping forward/back - Complete Level 3 (Stands with assistance) - Balance while standing  and cannot march in place Level 4 (Ambulates with assistance) - Balance while stepping forward/back - Complete Level 3 (Stands with assistance) - Balance while standing  and cannot march in place Level 3 (Stands with assistance) - Balance while standing  and cannot march in place   Is the above level different from baseline mobility prior to current illness? Yes - Recommend PT order Yes - Recommend PT order Yes - Recommend PT order Yes - Recommend PT order Yes - Recommend PT order    Row Name 06/26/24 2210           Does the patient have exclusion criteria? No - Perform mobility assessment       What is the highest level of mobility based on the mobility assessment? Level 3 (Stands with assistance) - Balance while standing  and cannot march in place       Is the above level different from baseline mobility prior to current illness? Yes - Recommend PT order          Barriers to discharge: None Disposition Plan: SNF vs residential hospice ; pending GOC discussions more with PCM HH orders placed: N/A Status is: Inpatient  Objective: Blood pressure (!) 113/90, pulse 92, temperature 98.3 F (36.8 C), temperature source Oral, resp. rate 18, height 5' 2 (1.575 m), weight 66.1 kg, SpO2 100%.  Examination:  Physical Exam Constitutional:      Comments: Pleasant elderly woman lying in bed  pleasantly confused  HENT:     Head: Normocephalic and atraumatic.     Mouth/Throat:     Mouth: Mucous membranes are moist.  Eyes:     Extraocular Movements: Extraocular movements intact.  Cardiovascular:     Rate and Rhythm: Normal rate and regular rhythm.  Pulmonary:     Effort: Pulmonary effort is normal. No respiratory distress.     Breath sounds: Normal breath sounds. No wheezing.  Abdominal:     General: Bowel sounds are normal. There is no distension.      Palpations: Abdomen is soft.     Tenderness: There is no abdominal tenderness.     Comments: Midline abdominal incision noted with surgical dressing in place.  Wound is open with secondary intention  Musculoskeletal:        General: Normal range of motion.     Cervical back: Normal range of motion and neck supple.  Skin:    General: Skin is warm and dry.  Neurological:     Mental Status: Mental status is at baseline. She is disoriented.      Consultants:  General surgery Palliative care   Procedures:  06/09/2024: Diagnostic laparoscopy, exploratory laparotomy with small bowel resection  Data Reviewed: Results for orders placed or performed during the hospital encounter of 06/02/24 (from the past 24 hours)  Glucose, capillary     Status: None   Collection Time: 06/28/24  5:43 PM  Result Value Ref Range   Glucose-Capillary 93 70 - 99 mg/dL  Glucose, capillary     Status: None   Collection Time: 06/29/24 12:11 AM  Result Value Ref Range   Glucose-Capillary 92 70 - 99 mg/dL  Glucose, capillary     Status: None   Collection Time: 06/29/24  6:27 AM  Result Value Ref Range   Glucose-Capillary 97 70 - 99 mg/dL  Glucose, capillary     Status: None   Collection Time: 06/29/24 12:20 PM  Result Value Ref Range   Glucose-Capillary 83 70 - 99 mg/dL   Comment 1 Notify RN     I have reviewed pertinent nursing notes, vitals, labs, and images as necessary. I have ordered labwork to follow up on as indicated.  I have reviewed the last notes from staff over past 24 hours. I have discussed patient's care plan and test results with nursing staff, CM/SW, and other staff as appropriate.  Time spent: Greater than 50% of the 55 minute visit was spent in counseling/coordination of care for the patient as laid out in the A&P.   LOS: 27 days   Alm Apo, MD [facility] 06/29/2024, 2:52 PM

## 2024-06-29 NOTE — Progress Notes (Signed)
 [facility] Progress Note  20 Days Post-Op  Subjective: CC:  NAEO. Son at bedside says that the patients appetite is improved.   Objective: Vital signs in last 24 hours: Temp:  [97.7 F (36.5 C)-98.3 F (36.8 C)] 98.3 F (36.8 C) (09/16 0626) Pulse Rate:  [92-96] 92 (09/16 0626) Resp:  [18] 18 (09/16 0626) BP: (113-128)/(80-90) 113/90 (09/16 0626) SpO2:  [100 %] 100 % (09/16 0626) Weight:  [66.1 kg] 66.1 kg (09/16 0726) Last BM Date : 06/28/24  Intake/Output from previous day: 09/15 0701 - 09/16 0700 In: 1174.6 [P.O.:160; I.V.:1014.6] Out: -  Intake/Output this shift: Total I/O In: 120 [P.O.:120] Out: -   PE: Gen:  Alert, NAD, pleasant Card:  Regular rate and rhythm Pulm:  Normal effort Abd: Soft, non-tender, non-distended, midline wound is healing - >85% pink, healthy wound base, dehiscence stable to improved. Mepitel and moist-to-dry replaced. Skin: warm and dry, no rashes  Psych: A&Ox3   Lab Results:  Recent Labs    06/27/24 0312 06/28/24 0215  WBC 10.0 7.3  HGB 8.9* 8.7*  HCT 28.0* 28.2*  PLT 476* 432*   BMET Recent Labs    06/28/24 0215  NA 141  K 4.1  CL 109  CO2 22  GLUCOSE 102*  BUN 25*  CREATININE 0.42*  CALCIUM  9.1   PT/INR No results for input(s): LABPROT, INR in the last 72 hours. CMP     Component Value Date/Time   NA 141 06/28/2024 0215   NA 144 03/02/2024 1554   K 4.1 06/28/2024 0215   CL 109 06/28/2024 0215   CO2 22 06/28/2024 0215   GLUCOSE 102 (H) 06/28/2024 0215   BUN 25 (H) 06/28/2024 0215   BUN 20 03/02/2024 1554   CREATININE 0.42 (L) 06/28/2024 0215   CALCIUM  9.1 06/28/2024 0215   PROT 5.7 (L) 06/28/2024 0215   PROT 6.1 03/02/2024 1554   ALBUMIN  3.1 (L) 06/28/2024 0215   ALBUMIN  3.9 03/02/2024 1554   AST 38 06/28/2024 0215   ALT 57 (H) 06/28/2024 0215   ALKPHOS 115 06/28/2024 0215   BILITOT 0.4 06/28/2024 0215   BILITOT 0.2 03/02/2024 1554   GFRNONAA >60 06/28/2024 0215   GFRAA >60 01/22/2020  0226   Lipase     Component Value Date/Time   LIPASE 27 05/31/2024 2054       Studies/Results: No results found.  Anti-infectives: Anti-infectives (From admission, onward)    Start     Dose/Rate Route Frequency Ordered Stop   06/17/24 0900  vancomycin  (VANCOCIN ) IVPB 1000 mg/200 mL premix  Status:  Discontinued        1,000 mg 200 mL/hr over 60 Minutes Intravenous Every 24 hours 06/16/24 0839 06/16/24 1103   06/16/24 1600  metroNIDAZOLE  (FLAGYL ) IVPB 500 mg  Status:  Discontinued        500 mg 100 mL/hr over 60 Minutes Intravenous Every 12 hours 06/16/24 0840 06/16/24 1104   06/16/24 1500  ceFEPIme  (MAXIPIME ) 2 g in sodium chloride  0.9 % 100 mL IVPB  Status:  Discontinued        2 g 200 mL/hr over 30 Minutes Intravenous Every 12 hours 06/16/24 0840 06/16/24 1103   06/16/24 1400  meropenem  (MERREM ) 1 g in sodium chloride  0.9 % 100 mL IVPB  Status:  Discontinued        1 g 200 mL/hr over 30 Minutes Intravenous Every 8 hours 06/16/24 1105 06/23/24 1005   06/16/24 0815  vancomycin  (VANCOREADY) IVPB 1500 mg/300 mL  1,500 mg 150 mL/hr over 120 Minutes Intravenous  Once 06/16/24 0723 06/16/24 1608   06/10/24 0300  ceFEPIme  (MAXIPIME ) 2 g in sodium chloride  0.9 % 100 mL IVPB  Status:  Discontinued        2 g 200 mL/hr over 30 Minutes Intravenous Every 12 hours 06/09/24 1732 06/16/24 0840   06/09/24 2100  metroNIDAZOLE  (FLAGYL ) IVPB 500 mg  Status:  Discontinued        500 mg 100 mL/hr over 60 Minutes Intravenous Every 12 hours 06/09/24 1725 06/16/24 0840   06/09/24 1730  ceFEPIme  (MAXIPIME ) 1 g in sodium chloride  0.9 % 100 mL IVPB  Status:  Discontinued        1 g 200 mL/hr over 30 Minutes Intravenous Every 12 hours 06/09/24 1725 06/09/24 1731   06/09/24 1500  ceFEPIme  (MAXIPIME ) 2 g in sodium chloride  0.9 % 100 mL IVPB        2 g 200 mL/hr over 30 Minutes Intravenous  Once 06/09/24 1454 06/09/24 1743   06/03/24 1130  vancomycin  (VANCOCIN ) IVPB 1000 mg/200 mL premix   Status:  Discontinued        1,000 mg 200 mL/hr over 60 Minutes Intravenous Every 24 hours 06/02/24 1149 06/03/24 0949   06/03/24 0000  ceFEPIme  (MAXIPIME ) 2 g in sodium chloride  0.9 % 100 mL IVPB        2 g 200 mL/hr over 30 Minutes Intravenous Every 12 hours 06/02/24 1149 06/08/24 1553   06/02/24 2200  metroNIDAZOLE  (FLAGYL ) IVPB 500 mg  Status:  Discontinued        500 mg 100 mL/hr over 60 Minutes Intravenous Every 12 hours 06/02/24 1113 06/09/24 1730   06/02/24 1100  ceFEPIme  (MAXIPIME ) 2 g in sodium chloride  0.9 % 100 mL IVPB        2 g 200 mL/hr over 30 Minutes Intravenous  Once 06/02/24 1045 06/02/24 1449   06/02/24 1100  vancomycin  (VANCOCIN ) IVPB 1000 mg/200 mL premix        1,000 mg 200 mL/hr over 60 Minutes Intravenous  Once 06/02/24 1045 06/02/24 1233   06/02/24 1100  metroNIDAZOLE  (FLAGYL ) IVPB 500 mg        500 mg 100 mL/hr over 60 Minutes Intravenous  Once 06/02/24 1045 06/02/24 1818        Assessment/Plan   s/p Procedure(s): LAPAROSCOPY, DIAGNOSTIC, EXPLORATORY LAPAROTOMY WITH SMALL BOWEL RESECTION 06/09/2024  - POD#20 AFVSS, WBC 7.3 - continue dressing  changes as ordered; will check wound tomorrow - PT/OT - CCS will see 1-2 times weekly, noted plans for palliative meeting Thursday 9/18 @ noon   FEN - reg diet; TPN VTE - lovenox  ID - no issues Disposition - discharge planning      LOS: 27 days   I reviewed nursing notes, hospitalist notes, last 24 h vitals and pain scores, last 48 h intake and output, last 24 h labs and trends, and last 24 h imaging results.  This care required moderate level of medical decision making.   Almarie Pringle, PA-C [facility] Please see Amion for pager number during day hours 7:00am-4:30pm

## 2024-06-29 NOTE — Consult Note (Signed)
 Consultation Note Date: 06/29/2024   Patient Name: Lindsay Mason  DOB: 07/11/1951  MRN: [id number]  Age / Sex: 73 y.o., female  PCP: [name], MD Referring Physician: Patsy Lenis, MD  Reason for Consultation: Establishing goals of care  HPI/Patient Profile: 73 y.o. 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 Patient underwent exploratory laparotomy with small bowel resection on 06-09-2024. Since then, has been on TPN, has had poor oral intake Repeat CT scan 9 - 2 - 25 with new enhancing fluid collections in the right paracolic gutter and central pelvis worrisome for abscesses, seen and evaluated by IR and underwent drain placement on 9-3. Palliative consult for goals of care discussions especially in light of poor oral intake CODE STATUS and overall broad goals of care discussions. Patient seen Discussed with son. Advance care planning documents reviewed  Palliative medicine is specialized medical care for people living with serious illness. It focuses on providing relief from the symptoms and stress of a serious illness. The goal is to improve quality of life for both the patient and the family. Goals of care: Broad aims of medical therapy in relation to the patient's values and preferences. Our aim is to provide medical care aimed at enabling patients to achieve the goals that matter most to them, given the circumstances of their particular medical situation and their constraints.    NEXT OF KIN Son  present at bedside has spouse has other children  Recommendations: Continue current mode of care Monitor oral intake and activity level Family meeting for CODE STATUS, broad goals of care discussions, discussions pertaining to patient's concerning artificial nutrition and hydration (PEG tube) has been set up 07-01-2024 at noon.  Code Status/Advance Care Planning: Full code   Symptom Management:     Palliative Prophylaxis:  Delirium Protocol   Psycho-social/Spiritual:  Desire for further Chaplaincy support:yes Additional Recommendations: Caregiving  Support/Resources  Prognosis:  Unable to determine  Discharge Planning: To Be Determined      Primary Diagnoses: Present on Admission:  Severe early onset Alzheimer dementia (HCC)  PAD (peripheral artery disease) (HCC)  (Resolved) Sepsis (HCC)  Thoracic aortic atherosclerosis (HCC)  (Resolved) Acute metabolic encephalopathy  (Resolved) Contamination of blood culture  COPD with emphysema (HCC)  Nausea and vomiting  (Resolved) Abdominal pain  Hyperlipidemia  Abnormal EKG  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 The following aspects are pertinent.  Past Medical History:  Diagnosis Date   Adrenal hyperplasia (HCC) 12/2011   on CT   Allergy    CAP (community acquired pneumonia) 12/2011   Chest pain  normal ETT 10/2011   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   by CT - moderate centrilobular and paraseptal   Ex-smoker 08/2014   currently using e cig   Microscopic colitis 06/2015   lymphocytic by colonoscopy - started entocort [name])   Osteoporosis, unspecified    Scoliosis of lumbar spine    Thoracic aortic atherosclerosis (HCC) 02/2016   by CT   Social History   Socioeconomic History   Marital status: Married    Spouse name: Not on file   Number of children: 3   Years of education: Not on  file   Highest education level: Not on file  Occupational History   Occupation: retired  Tobacco Use   Smoking status: Former    Current packs/day: 0.00    Average packs/day: 1 pack/day for 36.7 years (36.7 ttl pk-yrs)    Types: Cigarettes    Start date: 10/14/1978    Quit date: 07/15/2015    Years since quitting: 8.9   Smokeless tobacco: Never   Tobacco comments:    off and on smoker, now e-cigarettes  Vaping Use   Vaping status: Some Days   Devices: uses occasionally - no nicotine  Substance and Sexual Activity   Alcohol use: Not Currently   Drug use: No   Sexual activity: Not on file  Other Topics Concern   Not on file  Social History Narrative   Caffeine: 1 cup coffee/day   Lives with husband, outside dog   Smoking vapes.   Pt tans regularly - at her pool and in her garden. Wears sunscreen   Social Drivers of Corporate investment banker Strain: Low Risk  (05/05/2024)   Overall Financial Resource Strain (CARDIA)    Difficulty of Paying Living Expenses: Not hard at all  Food Insecurity: No Food Insecurity (06/02/2024)   Hunger Vital Sign    Worried About Running Out of Food in the Last Year: Never true    Ran Out of Food in the Last Year: Never true  Transportation Needs: No Transportation Needs (06/02/2024)   PRAPARE - Administrator, Civil Service (Medical): No    Lack of Transportation (Non-Medical): No  Physical Activity: Sufficiently Active (05/05/2024)   Exercise Vital Sign    Days of Exercise per Week: 7 days    Minutes of Exercise per Session: 40 min  Stress: No Stress Concern Present (05/05/2024)   [employer] of Occupational Health - Occupational Stress Questionnaire    Feeling of Stress: Only a little  Social Connections: Moderately Isolated (06/02/2024)   Social Connection and Isolation Panel    Frequency of Communication with Friends and Family: Three times a week    Frequency of Social Gatherings with Friends and Family: Three times a week     Attends Religious Services: Never    Active Member of Clubs or Organizations: No    Attends Banker Meetings: Never    Marital Status: Married   Family History  Problem Relation Age of Onset   Osteoporosis Other        great grandmother   Stroke Maternal Grandmother    Coronary artery disease Maternal Grandfather 46   Diabetes Maternal Grandfather    Coronary artery disease Paternal Grandmother 7   Coronary artery disease Paternal Grandfather 65   Lung cancer Maternal Aunt    Scheduled Meds:  Chlorhexidine  Gluconate Cloth  6 each Topical Daily   docusate sodium   100 mg Oral BID   enoxaparin  (LOVENOX ) injection  40 mg Subcutaneous Q24H   feeding supplement  237 mL Oral BID BM   insulin  aspart  0-9 Units Subcutaneous Q6H   methocarbamol  (ROBAXIN ) injection  500 mg Intravenous Q8H   nortriptyline   25 mg Oral QHS   polyethylene glycol  17 g Oral Daily   QUEtiapine   50 mg Oral QAC supper   rivastigmine   9.5 mg Transdermal Daily   sodium chloride  flush  10-40 mL Intracatheter Q12H   Continuous Infusions:  TPN ADULT (ION) 80 mL/hr at 06/28/24 1722   TPN ADULT (ION)     PRN Meds:.acetaminophen , albuterol , haloperidol  lactate, hydrALAZINE , morphine  injection, [DISCONTINUED] ondansetron  **OR** ondansetron  (ZOFRAN ) IV, oxyCODONE , phenol, sodium chloride  flush Medications Prior to Admission:  Prior to Admission medications   Medication Sig Start Date End Date Taking? Authorizing Provider  albuterol  (VENTOLIN  HFA) 108 (90 Base) MCG/ACT inhaler Inhale 2 puffs into the lungs every 6 (six) hours as needed for wheezing or shortness of breath. 08/15/22  Yes Icard, Adine CROME, DO  atorvastatin  (LIPITOR) 20 MG tablet Take 20 mg by mouth daily.   Yes [provider]  b complex vitamins capsule Take 1 capsule by mouth daily.   Yes [provider]  famotidine  (PEPCID ) 20 MG tablet Take 1 tablet (20 mg total) by mouth daily as needed for heartburn or indigestion.  12/05/23  Yes [name], MD  Multiple Vitamin (MULTIVITAMIN) tablet Take 1 tablet by mouth daily.   Yes [provider]  nortriptyline  (PAMELOR ) 25 MG capsule Take 1 capsule (25 mg total) by mouth at bedtime. 05/07/24  Yes [name], MD  traZODone  (DESYREL ) 50 MG tablet Take 50 mg by mouth at bedtime as needed for sleep. 05/20/24  Yes [provider]  rivastigmine  (EXELON ) 9.5 mg/24hr PLACE 1 PATCH (9.5 MG TOTAL) ONTO THE SKIN DAILY. 06/08/24   [name], MD   Allergies  Allergen Reactions   Donepezil  Nausea And Vomiting   Penicillins Swelling    REACTION: swelling tongue - States has taken Cephalosporins without difficulty in past   Review of Systems +weakness  Physical Exam Sitting up in bed Elderly appearing woman Answers a few questions appropriately Regular work of breathing No peripheral edema  Vital Signs: BP (!) 113/90 (BP Location: Left Arm)   Pulse 92   Temp 98.3 F (36.8 C) (Oral)   Resp 18   Ht 5' 2 (1.575 m)   Wt 66.8 kg   SpO2 100%   BMI 26.94 kg/m  Pain Scale: Faces POSS *See Group Information*: 1-Acceptable,Awake and alert Pain Score: 2    SpO2: SpO2: 100 % O2 Device:SpO2: 100 % O2 Flow Rate: .O2 Flow Rate (L/min): 2 L/min  IO: Intake/output summary:  Intake/Output Summary (Last 24 hours) at 06/29/2024 1036 Last data filed at 06/29/2024 9388 Gross per 24 hour  Intake 1114.63 ml  Output --  Net 1114.63 ml    LBM: Last BM Date : 06/28/24 Baseline Weight: Weight: 61.3 kg Most recent weight: Weight: 66.8 kg     Palliative Assessment/Data:   PPS 50%  Time In:  9.20 Time Out:  10.35 Time Total:  75 Greater than 50%  of this time was spent counseling and coordinating care related to the above assessment and plan.  Signed by: Lonia Serve, MD   Please contact Palliative Medicine Team phone at [phone] for questions and concerns.  For individual provider: See Tracey

## 2024-06-29 NOTE — Progress Notes (Signed)
 PHARMACY - TOTAL PARENTERAL NUTRITION CONSULT NOTE   Indication: Prolonged ileus  Patient Measurements: Height: 5' 2 (157.5 cm) Weight: 66.8 kg (147 lb 4.3 oz) IBW/kg (Calculated) : 50.1 TPN AdjBW (KG): 61.3 Body mass index is 26.94 kg/m.   Assessment: Presented to ED with N/V/D and abdominal pain x 3d on 05/31/24. CT negative. Possibly from diverticulitis and placed on Cefepime /Flagyl . Concern for SBO, NGT placed. Continues to have persistent pain and distention. No significant intake since 8/21. Pharmacy consulted to start TPN on 8/26. S/p ex lap with small bowel resection on 8/27.    Glucose / Insulin : No h/o DM -CBGs within goal < 150 -1u SSI given in last 24 hrs Electrolytes: WNL including coCa (9.7) Renal: Scr and BUN WNL Hepatic: LFTs WNL; alb 3.1; TG stable Intake / Output; MIVF:  -LBM documented 9/15, UOP minimal documentation -Very little PO intake  GI Imaging: 8/19 CT: Scattered colonic diverticulosis. No active diverticulitis.   8/22, 8/23, 8/25, 8/26 Abd Xray: all SBO  8/26 CT: severely thickened and inflamed central pelvic loops of small bowel concerning for ischemia. Concern for mesenteric volvulus or closed loop obstruction.  9/2 CT: 1. Interval abdominal surgery. There is a small amount of free air in the upper abdomen likely related to recent surgery. 2. New enhancing fluid collections in the right paracolic gutter and central pelvis worrisome for abscesses. 3. Persistent clustered small bowel loops in the pelvis with wall thickening and mesenteric edema. There is a small amount of air within the mesentery at this level which is new from prior. 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 4. Dilated small bowel loops in the upper abdomen worrisome for small bowel obstruction. 5. Diffuse bladder wall thickening with small amount of air in the bladder. Findings may be related to recent instrumentation or infection. 6. Small amount of simple free fluid in the right upper  quadrant and left upper quadrant. 7. Body wall edema. 8. Aortic atherosclerosis.  GI Surgeries / Procedures:  8/27 Ex lap with small bowel resection 9/3 IR drain placement to abd/pelvic fluid collection  Central access: PICC TPN start date: 8/26  Nutritional Goals: Goal TPN rate is 80 mL/hr (provides 86 g of protein and  1759 kcals per day)  RD Assessment: Kcal 1650-1850 Protein 75-90 g Fluid 1.8 L/day  Current Nutrition:  Ensure Plus High Protein BID Magic cup TIDAC  Patient initially with minimal PO intake which had been improving with assistance from family and staff. TPN was weaned to half rate in anticipation of continued PO intake. Unfortunately, intake has decreased. Intake has remained low at ~ 20-25% of some meals.   Ongoing discussions with family regarding long term goals. Palliative has been consulted, plan for family meeting on 9/18.   Plan:  -Continue TPN at 80 mL/hr -Electrolytes in TPN:  Na 50 mEq/L K 40 mEq/L Ca 5 mEq/L Mg 2 mEq/L Phos 10 mmol/L Cl:Ac 1:1 -Add standard MVI and trace elements to TPN -Continue Sensitive q6h SSI -Monitor TPN labs on Mon/Thurs -Continue to follow for plan of care   Stefano MARLA Bologna, PharmD, BCPS Clinical Pharmacist 06/29/2024 11:11 AM

## 2024-06-30 DIAGNOSIS — K5700 Diverticulitis of small intestine with perforation and abscess without bleeding: Secondary | ICD-10-CM | POA: Diagnosis not present

## 2024-06-30 LAB — GLUCOSE, CAPILLARY
Glucose-Capillary: 100 mg/dL — ABNORMAL HIGH (ref 70–99)
Glucose-Capillary: 97 mg/dL (ref 70–99)
Glucose-Capillary: 84 mg/dL (ref 70–99)

## 2024-06-30 MED ORDER — TRAVASOL 10 % IV SOLN
INTRAVENOUS | Status: AC
  Filled 2024-06-30: qty 864

## 2024-06-30 NOTE — Progress Notes (Signed)
 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     Patient presented to the hospital with positive blood culture with staph species.  Patient had lethargy and worsening mental status.  In the ED patient had slightly low blood pressure.  White blood cell elevated at 13.  CT abdomen and pelvis done on 06/01/2024 revealed no acute finding.  Patient was admitted to the hospital for further evaluation and treatment.  During hospitalization, patient continued to have abdominal pain and distention.  General surgery was consulted.   Repeat CT scanning of the abdomen and pelvis with contrast done on 06/08/2024 revealed severely thickened and inflamed central pelvic loops of small bowel concerning for ischemia. Complex appearance due to indistinct bowel wall margins, surrounding edema, dilated small bowel extending towards these loops, and at least some sense of twisting of bowel along adjacent branches of the superior mesenteric vein concerning for mesenteric volvulus or closed loop obstruction.   Patient eventually underwent exploratory laparotomy with a small bowel resection 06/09/2024.  Patient is currently on TPN, with very poor oral intake.  Repeat CT scan 06/15/2024 revealed new enhancing fluid collections in the right paracolic gutter and central pelvis worrisome for abscesses. IR consulted for drain placement. Underwent drain placement on 9/03.     Assessment & Plan:   Sepsis - resolved  Concern for SB ischemia vs SBO vs volvulus with possible microperforation -Patient initially had  leukocytosis with hypotension and lethargy - Followed by surgery and taken to the OR on 06/09/2024 for diagnostic laparoscopy, exploratory laparotomy with small bowel resection --Treated initially with  cefepime  and metronidazole  14 days.  Blood cultures negative. - repeat CT 9/7 showing new fluid collection RLQ 4.1 x 2.6 cm and persistent central pelvic collection measuring 3.6 x 3.5 cm - Remains afebrile with resolved leukocytosis off antibiotics Currently on TPN Palliative care on board and discussing goals of care. Palliative care plans to meet up with family tomorrow to discuss ongoing care and possibility of PEG tube placement.   Metabolic Encephalopathy Lindsay Mason - Presumed worsening in setting of acute infection; son states encephalopathy worsens when she is sick -Continue delirium precautions - trial of seroquel ; avoiding over sedating meds as able unless necessary - PRN haldol     Hypophosphatemia - repleted   Hypokalemia - Repleted   Blood culture contamination - 06/01/2024 culture noted with Staph hominis, presumed contaminant.  No further workup   COPD with emphysema  Continue albuterol  inhaler   Hyperlipidemia PAD (peripheral artery disease)  Thoracic aortic atherosclerosis -Holding Lipitor   History of essential hypertension On as needed IV hydralazine    Weakness, deconditioning.   - Seen by PT OT and recommended skilled nursing facility placement    Dysphagia  - s/p SLP eval - continue regular diet      Antimicrobials: Meropenem .  Discontinued 06/23/2024   DVT prophylaxis:  enoxaparin  (LOVENOX ) injection 40 mg Start: 06/16/24 2100 SCDs Start: 06/02/24 1114     Code Status:   Code Status: Full Code    Barriers to discharge: None Disposition Plan: SNF vs residential hospice ; pending GOC discussions more with PCM  HH orders placed: N/A Status is: Inpatient    Physical Exam Constitutional:      Comments: Pleasant elderly woman lying  in bed pleasantly confused  HENT:     Head: Normocephalic and atraumatic.     Mouth/Throat:     Mouth: Mucous membranes are moist.  Eyes:     Extraocular Movements: Extraocular movements intact.  Cardiovascular:     Rate and Rhythm: Normal rate and regular rhythm.  Pulmonary:     Effort: Pulmonary effort is normal. No respiratory distress.     Breath sounds: Normal breath sounds. No wheezing.  Abdominal:     General: Bowel sounds are normal. There is no distension.     Palpations: Abdomen is soft.     Tenderness: There is no abdominal tenderness.     Comments: Midline abdominal incision noted with surgical dressing in place.  Wound is open with secondary intention  Musculoskeletal:        General: Normal range of motion.     Cervical back: Normal range of motion and neck supple.  Skin:    General: Skin is warm and dry.  Neurological:     Mental Status: Mental status is at baseline. She is disoriented.     Consultants:  General surgery Palliative care    Procedures:  06/09/2024: Diagnostic laparoscopy, exploratory laparotomy with small bowel resection   Subjective: Patient seen and examined at bedside this morning She denies nausea vomiting abdominal pain chest pain Continues to be on TPN with no issues overnight  Data Reviewed:     Latest Ref Rng & Units 06/28/2024    2:15 AM 06/26/2024    3:37 AM 06/24/2024    3:43 AM  BMP  Glucose 70 - 99 mg/dL 897  892  898   BUN 8 - 23 mg/dL 25  24  26    Creatinine 0.44 - 1.00 mg/dL 9.57  9.56  9.57   Sodium 135 - 145 mmol/L 141  140  140   Potassium 3.5 - 5.1 mmol/L 4.1  4.1  4.2   Chloride 98 - 111 mmol/L 109  105  105   CO2 22 - 32 mmol/L 22  23  23    Calcium  8.9 - 10.3 mg/dL 9.1  9.2  9.3     Vitals:   06/29/24 0726 06/29/24 2055 06/30/24 0500 06/30/24 1420  BP:  138/84  106/74  Pulse:  94  94  Resp:  18  19  Temp:  (!) 97.4 F (36.3 C)  98.8 F (37.1 C)  TempSrc:  Oral  Oral  SpO2:  93%  92%  Weight: 66.1 kg  67.2  kg   Height:       Author: Drue ONEIDA Potter, MD 06/30/2024 4:05 PM  For on call review [email or web address].

## 2024-06-30 NOTE — Progress Notes (Signed)
 Occupational Therapy Treatment Patient Details Name: Lindsay Mason MRN: [id number] DOB: 12-28-1950 Today's Date: 06/30/2024   History of present illness Pt is 73 yr old female presented on 06/02/24  with + blood culture with staph species, lethargy, and worsening mental status. Pt admitted with SIRS, abdominal pain, nausea, vomiting,  likely secondary to acute diverticulitis, and SBO vs ileus.  Pt with NG tube and surgery following. s/p exploratory laparotomy with small bowel resection secondary to ischemic bowel on 06/09/24.  Drain placement for pelvic abscess 06/16/24. Currently on TPN.  Pt also with metabolic encephalopathy on background of Alzheimer's dementia.  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 She required increased assist for toileting management at bathroom level, as well as assistance for upper body grooming standing at the sink. She presented with good effort during the session, though she was limited by impaired cognition secondary to a history of Alzheimer's dementia. As such, she frequently required increased assist and cues for sequencing, problem solving, memory/recall, and sustained attention. She also reported feeling dizzy and lightheaded with activity. Continue OT plan of care. Patient will benefit from continued inpatient follow up therapy, <3 hours/day.       If plan is discharge home, recommend the following:  Assistance with feeding;Direct supervision/assist for medications management;Assistance with cooking/housework;Direct supervision/assist for financial management;Assist for transportation;Help with stairs or ramp for entrance;Supervision due to cognitive status;A  lot of help with bathing/dressing/bathroom;A lot of help with walking and/or transfers   Equipment Recommendations  Other (comment) (defer to next level of care)    Recommendations for Other Services      Precautions / Restrictions Precautions Precautions: Fall Other Brace: abdominal binder Restrictions Weight Bearing Restrictions Per Provider Order: No Other Position/Activity Restrictions: abdominal surgery/precautions       Mobility Bed Mobility Overal bed mobility: Needs Assistance Bed Mobility: Supine to Sit     Supine to sit: Contact guard, Used rails, HOB elevated Sit to supine: Mod assist (required assist for BLE back onto bed)        Transfers Overall transfer level: Needs assistance Equipment used: None Transfers: Sit to/from Stand Sit to Stand: Min assist                 Balance     Sitting balance-Leahy Scale: Fair         Standing balance comment: CGA to min assist          ADL either performed or assessed with clinical judgement   ADL Overall ADL's : Needs assistance/impaired     Grooming: Wash/dry hands;Contact guard assist;Standing;Cueing for sequencing;Cueing for safety Grooming Details (indicate cue type and reason): Pt required step-by-step prompts,  intermittent demonstrations, and assist for problem solving to perform hand washing at the sink.                 Toilet Transfer: Minimal assistance;Cueing for safety;Cueing for sequencing;Ambulation;Grab bars Toilet Transfer Details (indicate cue type and reason): Pt required step-by-step prompts, assist for problem solving, and assist for sequencing. Toileting- Clothing Manipulation and Hygiene: Sit to/from stand;Maximal assistance;Cueing for safety;Cueing for sequencing Toileting - Clothing Manipulation Details (indicate cue type and reason): ADL instruction was provided for toileting management at bathroom level. She required increased assist for most  all tasks, given her  impaired cognition. Increased time and cues were needed throughout, notably cues and prompts for problem solving, attention, and sequencing.             Praxis     Communication Communication Factors Affecting Communication: Difficulty expressing self   Cognition Arousal: Alert Behavior During Therapy: Anxious Cognition: History of cognitive impairments, Cognition impaired     Awareness: Online awareness impaired Memory impairment (select all impairments): Short-term memory, Working [employer] fast streamer, Conservation officer, historic buildings, Non-declarative long-term memory Attention impairment (select first level of impairment): Divided attention, Sustained attention Executive functioning impairment (select all impairments): Initiation, Organization, Sequencing, Reasoning, Problem solving                   Following commands: Impaired Following commands impaired: Follows one step commands inconsistently      Cueing   Cueing Techniques: Verbal cues, Gestural cues, Tactile cues, Visual cues             Pertinent Vitals/ Pain       Pain Assessment Pain Score: 3  Pain Location: back and abdomen with activity Pain Intervention(s): Limited activity within patient's tolerance, Monitored during session, Repositioned   Frequency  Min 2X/week        Progress Toward Goals  OT Goals(current goals can now be found in the care plan section)     Acute Rehab OT Goals OT Goal Formulation: Patient unable to participate in goal setting Time For Goal Achievement: 06/29/24 Potential to Achieve Goals: Fair  Plan         AM-PAC OT 6 Clicks Daily Activity     Outcome Measure   Help from another person eating meals?: A Little Help from another person taking care of personal grooming?: A Little Help from another person toileting, which includes using toliet, bedpan, or urinal?: A Lot Help from another person bathing (including washing, rinsing, drying)?: A Lot Help from another person to  put on and taking off regular upper body clothing?: A Lot Help from another person to put on and taking off regular lower body clothing?: A Lot 6 Click Score: 14    End of Session Equipment Utilized During Treatment: Other (comment)  OT Visit Diagnosis: Unsteadiness on feet (R26.81);Other abnormalities of gait and mobility (R26.89);Muscle weakness (generalized) (M62.81);Other symptoms and signs involving cognitive function;Pain   Activity Tolerance Patient tolerated treatment well   Patient Left in bed;with call bell/phone within reach;with bed alarm set   Nurse Communication Mobility status        Time: 1425-1450 OT Time Calculation (min): 25 min  Charges: OT General Charges $OT Visit: 1 Visit OT Treatments $Self Care/Home Management : 8-22 mins $Therapeutic Activity: 8-22 mins     Delanna JINNY Lesches, OTR/L 06/30/2024, 4:29 PM

## 2024-06-30 NOTE — Progress Notes (Signed)
 PHARMACY - TOTAL PARENTERAL NUTRITION CONSULT NOTE   Indication: Prolonged ileus  Patient Measurements: Height: 5' 2 (157.5 cm) Weight: 67.2 kg (148 lb 2.4 oz) IBW/kg (Calculated) : 50.1 TPN AdjBW (KG): 61.3 Body mass index is 27.1 kg/m.  Assessment: Presented to ED with N/V/D and abdominal pain x 3d on 05/31/24. CT negative. Possibly from diverticulitis and placed on Cefepime /Flagyl . Concern for SBO, NGT placed. Continues to have persistent pain and distention. No significant intake since 8/21. Pharmacy consulted to start TPN on 8/26. S/p ex lap with small bowel resection on 8/27.    Glucose / Insulin : No h/o DM -CBGs within goal < 150 -No SSI given in last 24 hrs Electrolytes: WNL including coCa (9.7) Renal: Scr and BUN WNL Hepatic: LFTs WNL; alb 3.1; TG stable Intake / Output; MIVF:  -LBM documented 9/16, UOP minimal documentation -Very little PO intake  GI Imaging: 8/19 CT: Scattered colonic diverticulosis. No active diverticulitis.   8/22, 8/23, 8/25, 8/26 Abd Xray: all SBO  8/26 CT: severely thickened and inflamed central pelvic loops of small bowel concerning for ischemia. Concern for mesenteric volvulus or closed loop obstruction.  9/2 CT: 1. Interval abdominal surgery. There is a small amount of free air in the upper abdomen likely related to recent surgery. 2. New enhancing fluid collections in the right paracolic gutter and central pelvis worrisome for abscesses. 3. Persistent clustered small bowel loops in the pelvis with wall thickening and mesenteric edema. There is a small amount of air within the mesentery at this level which is new from prior. 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 4. Dilated small bowel loops in the upper abdomen worrisome for small bowel obstruction. 5. Diffuse bladder wall thickening with small amount of air in the bladder. Findings may be related to recent instrumentation or infection. 6. Small amount of simple free fluid in the right upper  quadrant and left upper quadrant. 7. Body wall edema. 8. Aortic atherosclerosis.  GI Surgeries / Procedures:  8/27 Ex lap with small bowel resection 9/3 IR drain placement to abd/pelvic fluid collection  Central access: PICC TPN start date: 8/26  Nutritional Goals: Goal TPN rate is 80 mL/hr (provides 86 g of protein and  1759 kcals per day)  RD Assessment: Kcal 1650-1850 Protein 75-90 g Fluid 1.8 L/day  Current Nutrition:  Ensure Plus High Protein BID Magic cup TIDAC  Patient initially with minimal PO intake which had been improving with assistance from family and staff. TPN was weaned to half rate in anticipation of continued PO intake. Unfortunately, intake has decreased. Intake has remained low at ~ 20-25% of some meals.   Ongoing discussions with family regarding long term goals. Palliative has been consulted, plan for family meeting on 9/18.  Plan:  -Continue TPN at 80 mL/hr -Electrolytes in TPN:  Na 50 mEq/L K 40 mEq/L Ca 5 mEq/L Mg 2 mEq/L Phos 10 mmol/L Cl:Ac 1:1 -Add standard MVI and trace elements to TPN -Continue Sensitive q6h SSI -Monitor TPN labs on Mon/Thurs -Continue to follow for plan of care   Lacinda Moats, PharmD Clinical Pharmacist  9/17/202510:13 AM

## 2024-06-30 NOTE — Plan of Care (Signed)

## 2024-06-30 NOTE — Plan of Care (Signed)
  Problem: Education: Goal: Knowledge of General Education information will improve Description: Including pain rating scale, medication(s)/side effects and non-pharmacologic comfort measures Outcome: Progressing   Problem: Fluid Volume: Goal: Hemodynamic stability will improve Outcome: Progressing

## 2024-06-30 NOTE — TOC Progression Note (Signed)
 Transition of Care [facility]) - Progression Note    Patient Details  Name: Lindsay Mason MRN: [id number] Date of Birth: [birth year]  Transition of Care [facility]) CM/SW Contact  Heather DELENA Saltness, LCSW Phone Number: 06/30/2024, 3:32 PM  Clinical Narrative:    Pt still on TPN, half-weaned to test PO intake. PO intake has been poor. Palliative care consulted, goals of care meeting with family scheduled for tomorrow 9/18. TOC will continue to follow.   Expected Discharge Plan: Skilled Nursing Facility Barriers to Discharge: Continued Medical Work up   Expected Discharge Plan and Services In-house Referral: Clinical Social Work Discharge Planning Services: CM Consult Post Acute Care Choice: Skilled Nursing Facility                   DME Arranged: N/A DME Agency: NA        Social Drivers of Health (SDOH) Interventions SDOH Screenings   Food Insecurity: No Food Insecurity (06/02/2024)  Housing: Low Risk  (06/02/2024)  Transportation Needs: No Transportation Needs (06/02/2024)  Utilities: Not At Risk (06/02/2024)  Alcohol Screen: Low Risk  (05/05/2024)  Depression (PHQ2-9): High Risk (05/07/2024)  Financial Resource Strain: Low Risk  (05/05/2024)  Physical Activity: Sufficiently Active (05/05/2024)  Social Connections: Moderately Isolated (06/02/2024)  Stress: No Stress Concern Present (05/05/2024)  Tobacco Use: Medium Risk (06/09/2024)  Health Literacy: Inadequate Health Literacy (05/05/2024)    Readmission Risk Interventions    06/14/2024    2:25 PM 01/11/2024   10:01 AM  Readmission Risk Prevention Plan  Post Dischage Appt  Complete  Medication Screening  Complete  Transportation Screening Complete Complete  Medication Review (RN Care Manager) Complete   HRI or Home Care Consult Complete   SW Recovery Care/Counseling Consult Complete   Palliative Care Screening Not Applicable   Skilled Nursing Facility Complete     Signed: Heather Saltness, MSW, LCSW Clinical Social  Worker Inpatient Care Management 06/30/2024 3:35 PM

## 2024-07-01 DIAGNOSIS — Z515 Encounter for palliative care: Secondary | ICD-10-CM | POA: Diagnosis not present

## 2024-07-01 DIAGNOSIS — K5700 Diverticulitis of small intestine with perforation and abscess without bleeding: Secondary | ICD-10-CM | POA: Diagnosis not present

## 2024-07-01 LAB — CBC WITH DIFFERENTIAL/PLATELET
Abs Immature Granulocytes: 0.03 K/uL (ref 0.00–0.07)
Basophils Absolute: 0.1 K/uL (ref 0.0–0.1)
Basophils Relative: 1 %
Eosinophils Absolute: 0.5 K/uL (ref 0.0–0.5)
Eosinophils Relative: 9 %
HCT: 28.1 % — ABNORMAL LOW (ref 36.0–46.0)
Hemoglobin: 8.5 g/dL — ABNORMAL LOW (ref 12.0–15.0)
Immature Granulocytes: 1 %
Lymphocytes Relative: 27 %
Lymphs Abs: 1.4 K/uL (ref 0.7–4.0)
MCH: 27.9 pg (ref 26.0–34.0)
MCHC: 30.2 g/dL (ref 30.0–36.0)
MCV: 92.1 fL (ref 80.0–100.0)
Monocytes Absolute: 0.4 K/uL (ref 0.1–1.0)
Monocytes Relative: 8 %
Neutro Abs: 2.9 K/uL (ref 1.7–7.7)
Neutrophils Relative %: 54 %
Platelets: 372 K/uL (ref 150–400)
RBC: 3.05 MIL/uL — ABNORMAL LOW (ref 3.87–5.11)
RDW: 17.1 % — ABNORMAL HIGH (ref 11.5–15.5)
WBC: 5.3 K/uL (ref 4.0–10.5)
nRBC: 0 % (ref 0.0–0.2)

## 2024-07-01 LAB — GLUCOSE, CAPILLARY
Glucose-Capillary: 100 mg/dL — ABNORMAL HIGH (ref 70–99)
Glucose-Capillary: 105 mg/dL — ABNORMAL HIGH (ref 70–99)
Glucose-Capillary: 78 mg/dL (ref 70–99)
Glucose-Capillary: 89 mg/dL (ref 70–99)
Glucose-Capillary: 99 mg/dL (ref 70–99)

## 2024-07-01 LAB — COMPREHENSIVE METABOLIC PANEL WITH GFR
ALT: 29 U/L (ref 0–44)
AST: 20 U/L (ref 15–41)
Albumin: 3.3 g/dL — ABNORMAL LOW (ref 3.5–5.0)
Alkaline Phosphatase: 109 U/L (ref 38–126)
Anion gap: 11 (ref 5–15)
BUN: 25 mg/dL — ABNORMAL HIGH (ref 8–23)
CO2: 21 mmol/L — ABNORMAL LOW (ref 22–32)
Calcium: 9 mg/dL (ref 8.9–10.3)
Chloride: 109 mmol/L (ref 98–111)
Creatinine, Ser: 0.48 mg/dL (ref 0.44–1.00)
GFR, Estimated: >60 mL/min (ref >60)
Glucose, Bld: 93 mg/dL (ref 70–99)
Potassium: 4 mmol/L (ref 3.5–5.1)
Sodium: 141 mmol/L (ref 135–145)
Total Bilirubin: 0.3 mg/dL (ref 0.0–1.2)
Total Protein: 6 g/dL — ABNORMAL LOW (ref 6.5–8.1)

## 2024-07-01 LAB — PHOSPHORUS: Phosphorus: 3.3 mg/dL (ref 2.5–4.6)

## 2024-07-01 LAB — MAGNESIUM: Magnesium: 2.1 mg/dL (ref 1.7–2.4)

## 2024-07-01 MED ORDER — TRAVASOL 10 % IV SOLN
INTRAVENOUS | Status: AC
  Filled 2024-07-01: qty 864

## 2024-07-01 NOTE — Plan of Care (Signed)

## 2024-07-01 NOTE — Progress Notes (Signed)
 PHARMACY - TOTAL PARENTERAL NUTRITION CONSULT NOTE   Indication: Prolonged ileus  Patient Measurements: Height: 5' 2 (157.5 cm) Weight: 65.3 kg (143 lb 15.4 oz) IBW/kg (Calculated) : 50.1 TPN AdjBW (KG): 61.3 Body mass index is 26.33 kg/m.  Assessment: Presented to ED with N/V/D and abdominal pain x 3d on 05/31/24. CT negative. Possibly from diverticulitis and placed on Cefepime /Flagyl . Concern for SBO, NGT placed. Continues to have persistent pain and distention. No significant intake since 8/21. Pharmacy consulted to start TPN on 8/26. S/p ex lap with small bowel resection on 8/27.    Glucose / Insulin : No h/o DM -CBGs within goal < 150 -No SSI given in last 24 hrs Electrolytes: WNL including coCa (9.5) Renal: Scr and BUN WNL Hepatic: LFTs WNL; alb 3.3; TG stable Intake / Output; MIVF:  -LBM documented 9/16, UOP not documented -Very little PO intake  GI Imaging: 8/19 CT: Scattered colonic diverticulosis. No active diverticulitis.   8/22, 8/23, 8/25, 8/26 Abd Xray: all SBO  8/26 CT: severely thickened and inflamed central pelvic loops of small bowel concerning for ischemia. Concern for mesenteric volvulus or closed loop obstruction.  9/2 CT: 1. Interval abdominal surgery. There is a small amount of free air in the upper abdomen likely related to recent surgery. 2. New enhancing fluid collections in the right paracolic gutter and central pelvis worrisome for abscesses. 3. Persistent clustered small bowel loops in the pelvis with wall thickening and mesenteric edema. There is a small amount of air within the mesentery at this level which is new from prior. 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 4. Dilated small bowel loops in the upper abdomen worrisome for small bowel obstruction. 5. Diffuse bladder wall thickening with small amount of air in the bladder. Findings may be related to recent instrumentation or infection. 6. Small amount of simple free fluid in the right upper quadrant  and left upper quadrant. 7. Body wall edema. 8. Aortic atherosclerosis.  GI Surgeries / Procedures:  8/27 Ex lap with small bowel resection 9/3 IR drain placement to abd/pelvic fluid collection  Central access: PICC TPN start date: 8/26  Nutritional Goals: Goal TPN rate is 80 mL/hr (provides 86 g of protein and  1759 kcals per day)  RD Assessment: Kcal 1650-1850 Protein 75-90 g Fluid 1.8 L/day  Current Nutrition:  Ensure Plus High Protein BID Magic cup TIDAC  Patient initially with minimal PO intake which had been improving with assistance from family and staff. TPN was weaned to half rate in anticipation of continued PO intake. Unfortunately, intake has decreased. Intake has remained low at ~ 20-25% or less of some meals.   Ongoing discussions with family regarding long term goals. Palliative has been consulted, plan for family meeting today.  Plan:  -Continue TPN at 80 mL/hr -Electrolytes in TPN:  Na 50 mEq/L K 40 mEq/L Ca 5 mEq/L Mg 2 mEq/L Phos 10 mmol/L Cl:Ac 1:1 -Add standard MVI and trace elements to TPN -Continue Sensitive q6h SSI -Monitor TPN labs on Mon/Thurs -Continue to follow for plan of care   Lindsay Mason, PharmD, BCPS Clinical Pharmacist 07/01/2024 10:18 AM

## 2024-07-01 NOTE — Plan of Care (Signed)
   Problem: Education: Goal: Knowledge of General Education information will improve Description Including pain rating scale, medication(s)/side effects and non-pharmacologic comfort measures Outcome: Progressing   Problem: Health Behavior/Discharge Planning: Goal: Ability to manage health-related needs will improve Outcome: Progressing

## 2024-07-01 NOTE — Progress Notes (Signed)
 Daily Progress Note   Patient Name: Lindsay Mason       Date: 07/01/2024 DOB: Oct 23, 1950  Age: 73 y.o. MRN#: [id number] Attending Physician: Dorinda Drue DASEN, MD Primary Care Physician: [name], MD Admit Date: 06/02/2024  Reason for Consultation/Follow-up: Establishing goals of care and Non pain symptom management  Subjective:  Awake alert, recognizes family in the room, son and husband at bedside Family meeting took place - see below.   Length of Stay: 29  Current Medications: Scheduled Meds:   Chlorhexidine  Gluconate Cloth  6 each Topical Daily   docusate sodium   100 mg Oral BID   enoxaparin  (LOVENOX ) injection  40 mg Subcutaneous Q24H   feeding supplement  237 mL Oral BID BM   Gerhardt's butt cream   Topical BID   insulin  aspart  0-9 Units Subcutaneous Q6H   methocarbamol  (ROBAXIN ) injection  500 mg Intravenous Q8H   nortriptyline   25 mg Oral QHS   polyethylene glycol  17 g Oral Daily   QUEtiapine   50 mg Oral QAC supper   rivastigmine   9.5 mg Transdermal Daily   sodium chloride  flush  10-40 mL Intracatheter Q12H    Continuous Infusions:  TPN ADULT (ION) 80 mL/hr at 06/30/24 1706   TPN ADULT (ION)      PRN Meds: acetaminophen  (TYLENOL ) oral liquid 160 mg/5 mL, albuterol , haloperidol  lactate, hydrALAZINE , loperamide , morphine  injection, [DISCONTINUED] ondansetron  **OR** ondansetron  (ZOFRAN ) IV, oxyCODONE , phenol, sodium chloride  flush  Physical Exam         Elderly lady resting in bed No acute distress Regular work of breathing Answers a few questions appropriately  Vital Signs: BP 115/81 (BP Location: Left Arm)   Pulse 92   Temp 98.5 F (36.9 C) (Oral)   Resp 18   Ht 5' 2 (1.575 m)   Wt 65.3 kg   SpO2 100%   BMI 26.33 kg/m  SpO2: SpO2: 100 % O2  Device: O2 Device: Room Air O2 Flow Rate: O2 Flow Rate (L/min): 2 L/min  Intake/output summary:  Intake/Output Summary (Last 24 hours) at 07/01/2024 1324 Last data filed at 07/01/2024 0930 Gross per 24 hour  Intake 220 ml  Output --  Net 220 ml   LBM: Last BM Date : 06/29/24 Baseline Weight: Weight: 61.3 kg Most recent weight: Weight: 65.3 kg  Palliative Assessment/Data:      Patient Active Problem List   Diagnosis Date Noted   Goals of care, counseling/discussion 06/28/2024   Malnutrition of moderate degree 06/23/2024   S/P small bowel resection 06/23/2024   Hypophosphatemia 06/22/2024   Hypokalemia 06/22/2024   Perforated of jejunal diverticulitis s/p SB resection 06/09/2024 06/09/2024   Nausea and vomiting 06/02/2024   Hyperlipidemia 06/02/2024   Abnormal EKG 06/02/2024   Transaminitis 05/09/2024   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 03/02/2024   Fall with injury 02/05/2024   Multiple closed fractures of ribs of left side with routine healing 02/05/2024   Urinary retention 02/05/2024   Non-small cell carcinoma of left lung, stage 1 (HCC) 01/20/2024   Pneumothorax 01/10/2024   PAD (peripheral artery disease) (HCC) 12/06/2023   UTI due to extended-spectrum beta lactamase (ESBL) producing Escherichia coli 12/06/2023   Acid reflux 11/06/2023   Sundowning 11/06/2023   Medicare annual wellness visit, subsequent 05/01/2022   Vitamin B12 deficiency 01/22/2021   History of essential hypertension 01/21/2021   Severe early onset Alzheimer dementia (HCC) 01/21/2021   History of primary malignant neoplasm of right lung 01/20/2020   S/P lobectomy of lung 01/20/2020   Advanced care planning/counseling discussion 06/26/2017   Degenerative scoliosis 06/26/2017   COPD with emphysema (HCC) 02/12/2016   Thoracic aortic atherosclerosis (HCC) 02/12/2016   Health maintenance examination 02/01/2016   Weight loss 02/01/2016   Microscopic colitis 06/05/2015   Abnormal  MRI of head 10/02/2011   Headache 08/06/2011   Ex-smoker 04/30/2007   Osteopenia 04/30/2007    Palliative Care Assessment & Plan   Patient Profile:    Assessment: 73 year old lady with COPD dyslipidemia 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, history of dementia with behavioral disturbances, history of peripheral arterial disease thoracic and abdominal aortic aneurysm Admitted to hospital medicine service with positive blood cultures with staph species, CT abdomen concerning for small bowel ischemia patient underwent ex lap with small bowel resection on 06-09-2024 Postop course complicated by minimal oral intake and on 9-3 patient underwent drain placement because repeat CT scan showed enhancing fluid collections in the right paracolic gutter worrisome for abscesses Palliative consultation for CODE STATUS, broad goals of care discussions as well as for discussions with regards to artificial nutrition and hydration has been requested. After a time-limited trial of current interventions including calorie count for the past 24-48 hours, a family meeting took place-see below.  Recommendations/Plan: With the patient's husband and son at bedside in person and 2 daughters on the phone - a family meeting took place to discuss code status, goals of care and scope of care in the context of current hospitalization, decisions regarding artificial nutrition and broad GOC. 1.) Code status changed from Full code to DNR-Limited. At end of life, family doesn't want patient to undergo CPR or aggressive heroic artificial measures. They state that she has osteoporosis and that they don't want her to be resuscitated. D  2.) Family prefers SNF for short-term rehabilitation post discharge. TOC consult placed. Patient lives alone with her husband and while family is available nearby, she has gotten significantly weaker since this hospitalization.   3.) Regarding nutritional plan, we  shared that TPN is not long-term, and likely be weaned off in the next 48 hours. Family opted to trial oral intake as patient tolerates, at present, they are in agreement to not proceed with placement of G-tube for artificial feeding. Would recommend SLP evaluation to assess swallowing function and  appropriate food textures.   4.) Family concerned about patient having C-diff infection. If loose stool persist, kindly consider testing for Cdiff.   PMT to follow, monitor efforts at PT OT and PO intake.   Code Status:    Code Status Orders  (From admission, onward)           Start     Ordered   07/01/24 1256  Do not attempt resuscitation (DNR)- Limited -Do Not Intubate (DNI)  (Code Status)  Continuous       Question Answer Comment  If pulseless and not breathing No CPR or chest compressions.   In Pre-Arrest Conditions (Patient Is Breathing and Has A Pulse) Do not intubate. Provide all appropriate non-invasive medical interventions. Avoid ICU transfer unless indicated or required.   Consent: Discussion documented in EHR or advanced directives reviewed      07/01/24 1255           Code Status History     Date Active Date Inactive Code Status Order ID Comments User Context   06/02/2024 1114 07/01/2024 1255 Full Code [id number]  Celinda Alm Lot, MD ED   01/09/2024 1020 01/17/2024 1852 Full Code [id number]  Zella Katha HERO, MD ED   01/20/2020 1539 01/22/2020 1707 Full Code [id number]  Lindia Laurel MATSU, PA-C Inpatient   12/31/2011 0156 01/01/2012 1602 Full Code [id number]  Harvey Zebedee RAMAN, RN Inpatient       Prognosis:  < 12 months  Discharge Planning: Skilled Nursing Facility for rehab with Palliative care service follow-up  Care plan was discussed with patient, son, husband and daughters on the phone.  Thank you for allowing the Palliative Medicine Team to assist in the care of this patient.  High MDM     Greater than 50%  of this time was spent counseling and coordinating  care related to the above assessment and plan.  Lonia Serve, MD  Please contact Palliative Medicine Team phone at [phone] for questions and concerns.

## 2024-07-01 NOTE — Progress Notes (Signed)
 Progress Note   Patient: Lindsay Mason FMW:[id number] DOB: 31-Oct-1950 DOA: 06/02/2024     29 DOS: the patient was seen and examined on 07/01/2024      Hospital Course: From HPI Ms. 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     Patient presented to the hospital with positive blood culture with staph species.  Patient had lethargy and worsening mental status.  In the ED patient had slightly low blood pressure.  White blood cell elevated at 13.  CT abdomen and pelvis done on 06/01/2024 revealed no acute finding.  Patient was admitted to the hospital for further evaluation and treatment.  During hospitalization, patient continued to have abdominal pain and distention.  General surgery was consulted.   Repeat CT scanning of the abdomen and pelvis with contrast done on 06/08/2024 revealed severely thickened and inflamed central pelvic loops of small bowel concerning for ischemia. Complex appearance due to indistinct bowel wall margins, surrounding edema, dilated small bowel extending towards these loops, and at least some sense of twisting of bowel along adjacent branches of the superior mesenteric vein concerning for mesenteric volvulus or closed loop obstruction.   Patient eventually underwent exploratory laparotomy with a small bowel resection 06/09/2024.  Patient is currently on TPN, with very poor oral intake.  Repeat CT scan 06/15/2024 revealed new enhancing fluid collections in the right paracolic gutter and central pelvis worrisome for abscesses. IR consulted for drain placement. Underwent drain placement on 9/03.     Assessment & Plan:   Sepsis - resolved  Concern for SB ischemia vs SBO vs volvulus with possible microperforation -Patient initially had  leukocytosis with hypotension and lethargy - Followed by surgery and taken to the OR on 06/09/2024 for diagnostic laparoscopy, exploratory laparotomy with small bowel resection --Treated initially with  cefepime  and metronidazole  14 days.  Blood cultures negative. - repeat CT 9/7 showing new fluid collection RLQ 4.1 x 2.6 cm and persistent central pelvic collection measuring 3.6 x 3.5 cm - Remains afebrile with resolved leukocytosis off antibiotics Currently on TPN Palliative care on board and discussing goals of care. Palliative care had a family discussion on 07/01/2024 CODE STATUS have been changed to DNR. Also family have agreed for TPN to be weaned off in the next 48 hours. Family opted to trial oral intake as patient tolerates, versus placement of G-tube for artificial feeding. Speech therapist have therefore been consulted for swallow eval   Metabolic Encephalopathy Alzheimer's dementia - Presumed worsening in setting of acute infection; son states encephalopathy worsens when she is sick -Continue delirium precautions - trial of seroquel ; avoiding over sedating meds as able unless necessary - PRN haldol     Hypophosphatemia - repleted   Hypokalemia - Repleted   Blood culture contamination - 06/01/2024 culture noted with Staph hominis, presumed contaminant.  No further workup   COPD with emphysema  Continue albuterol  inhaler   Hyperlipidemia PAD (peripheral artery disease)  Thoracic aortic atherosclerosis -Holding Lipitor   History of essential hypertension On as needed IV hydralazine    Weakness, deconditioning.   - Seen by PT OT and recommended skilled nursing facility placement    Dysphagia  - s/p SLP eval - continue regular diet       Antimicrobials: Meropenem .  Discontinued 06/23/2024   DVT prophylaxis:  enoxaparin  (LOVENOX ) injection 40 mg Start: 06/16/24  2100 SCDs Start: 06/02/24 1114     Code Status:   Code Status: DNR     Disposition Plan: Family  prefers SNF for short-term rehabilitation post discharge. TOC consult placed   HH orders placed: N/A Status is: Inpatient     Physical Exam Constitutional:      Comments: Pleasant elderly woman lying in bed pleasantly confused  HENT:     Head: Normocephalic and atraumatic.     Mouth/Throat:     Mouth: Mucous membranes are moist.  Eyes:     Extraocular Movements: Extraocular movements intact.  Cardiovascular:     Rate and Rhythm: Normal rate and regular rhythm.  Pulmonary:     Effort: Pulmonary effort is normal. No respiratory distress.     Breath sounds: Normal breath sounds. No wheezing.  Abdominal:     General: Bowel sounds are normal. There is no distension.     Palpations: Abdomen is soft.     Tenderness: There is no abdominal tenderness.     Comments: Midline abdominal incision noted with surgical dressing in place.  Wound is open with secondary intention  Musculoskeletal:        General: Normal range of motion.     Cervical back: Normal range of motion and neck supple.  Skin:    General: Skin is warm and dry.  Neurological:     Mental Status: Mental status is at baseline. She is disoriented.      Consultants:  General surgery Palliative care    Procedures:  06/09/2024: Diagnostic laparoscopy, exploratory laparotomy with small bowel resection   Subjective: Palliative care had family meeting today and CODE STATUS have been changed to DNR She is still disoriented however communicative   Data Reviewed:      Latest Ref Rng & Units 07/01/2024    2:42 AM 06/28/2024    2:15 AM 06/26/2024    3:37 AM  BMP  Glucose 70 - 99 mg/dL 93  897  892   BUN 8 - 23 mg/dL 25  25  24    Creatinine 0.44 - 1.00 mg/dL 9.51  9.57  9.56   Sodium 135 - 145 mmol/L 141  141  140   Potassium 3.5 - 5.1 mmol/L 4.0  4.1  4.1   Chloride 98 - 111 mmol/L 109  109  105   CO2 22 - 32 mmol/L 21  22  23    Calcium  8.9 - 10.3 mg/dL 9.0  9.1  9.2        Latest Ref Rng & Units 07/01/2024    2:42 AM  06/28/2024    2:15 AM 06/27/2024    3:12 AM  CBC  WBC 4.0 - 10.5 K/uL 5.3  7.3  10.0   Hemoglobin 12.0 - 15.0 g/dL 8.5  8.7  8.9   Hematocrit 36.0 - 46.0 % 28.1  28.2  28.0   Platelets 150 - 400 K/uL 372  432  476      Vitals:   06/30/24 1420 06/30/24 1957 07/01/24 0500 07/01/24 0659  BP: 106/74 (!) 137/106  115/81  Pulse: 94 (!) 101  92  Resp: 19 12  18   Temp: 98.8 F (37.1 C) 98.5 F (36.9 C)    TempSrc: Oral Oral    SpO2: 92%   100%  Weight:   65.3 kg   Height:         Author: Drue ONEIDA Potter, MD 07/01/2024 1:03 PM  For on call review [email or web address].

## 2024-07-02 DIAGNOSIS — R531 Weakness: Secondary | ICD-10-CM | POA: Diagnosis not present

## 2024-07-02 DIAGNOSIS — K5700 Diverticulitis of small intestine with perforation and abscess without bleeding: Secondary | ICD-10-CM | POA: Diagnosis not present

## 2024-07-02 DIAGNOSIS — Z515 Encounter for palliative care: Secondary | ICD-10-CM | POA: Diagnosis not present

## 2024-07-02 DIAGNOSIS — Z7189 Other specified counseling: Secondary | ICD-10-CM | POA: Diagnosis not present

## 2024-07-02 LAB — GLUCOSE, CAPILLARY
Glucose-Capillary: 105 mg/dL — ABNORMAL HIGH (ref 70–99)
Glucose-Capillary: 102 mg/dL — ABNORMAL HIGH (ref 70–99)
Glucose-Capillary: 101 mg/dL — ABNORMAL HIGH (ref 70–99)

## 2024-07-02 MED ORDER — TRAVASOL 10 % IV SOLN
INTRAVENOUS | Status: AC
  Filled 2024-07-02: qty 432

## 2024-07-02 MED ORDER — TRAVASOL 10 % IV SOLN
INTRAVENOUS | Status: DC
  Filled 2024-07-02: qty 864

## 2024-07-02 NOTE — Progress Notes (Addendum)
 Occupational Therapy Treatment Patient Details Name: Lindsay Mason MRN: [id number] DOB: 21-Jul-1951 Today's Date: 07/02/2024   History of present illness Pt is 73 yr old female who presented on 06/02/24  with + blood culture with staph species, lethargy, and worsening mental status. Pt admitted with SIRS, abdominal pain, nausea, vomiting, likely secondary to acute diverticulitis, and SBO vs ileus.  Pt with NG tube and surgery following. s/p exploratory laparotomy with small bowel resection, due to ischemic bowel 06/09/24.  Drain placement for pelvic abscess 06/16/24. Currently on TPN.  Pt also with metabolic encephalopathy on background of Alzheimer's dementia.  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 She required assist for supine to sit, upper body dressing, sit to stand, functional ambulation with hand held assist, and for upper body grooming seated in the chair. She presented with good effort and participation, though limited by impaired cognition, secondary to a history of Alzheimer's disease. Specifically, she had difficulty with cognitive processing, problem solving, memory, sustained attention, and sequencing, requiring increased assist and cues in this regard. Her 2 daughters and granddaughter were present during the session. Continue OT plan of care. Patient will benefit from continued inpatient follow up therapy, <3 hours/day.       If plan is discharge home, recommend the following:  Assistance with feeding;Direct supervision/assist for medications management;Assistance with cooking/housework;Direct supervision/assist for financial management;Assist for transportation;Help with stairs  or ramp for entrance;Supervision due to cognitive status;A lot of help with bathing/dressing/bathroom;A little help with walking and/or transfers   Equipment Recommendations  Other (comment)    Recommendations for Other Services      Precautions / Restrictions Precautions Precautions: Fall Other Brace: abdominal binder Restrictions Other Position/Activity Restrictions: abdominal surgery/precautions       Mobility Bed Mobility Overal bed mobility: Needs Assistance Bed Mobility: Supine to Sit     Supine to sit: Contact guard, Used rails, HOB elevated     General bed mobility comments: required increased cues for initiation and problem solving    Transfers Overall transfer level: Needs assistance Equipment used: None Transfers: Sit to/from Stand Sit to Stand: Contact guard assist, From elevated surface                 Balance Overall balance assessment: Needs assistance   Sitting balance-Leahy Scale: Fair         Standing balance comment: CGA to min assist           ADL either performed or assessed with clinical judgement   ADL Overall ADL's : Needs assistance/impaired Eating/Feeding: Minimal assistance;Sitting   Grooming: Minimal assistance;Sitting;Cueing for sequencing Grooming Details (indicate cue type and reason): Pt required step-by-step prompts,  intermittent demonstrations, cues for initiatio, sustained attention, short-term recall, and thoroughness with tasks, in order to perform face washing and hair combing seated in the chair.         Upper Body Dressing : Moderate assistance;Sitting;Cueing for sequencing Upper Body Dressing Details (indicate cue type and reason): Assist required to donn a hospital gown around her back seated EOB                         Communication Communication Factors Affecting Communication: Difficulty expressing self   Cognition  Arousal: Alert   Cognition: History of cognitive impairments, Cognition  impaired   Orientation impairments: Place, Time, Situation Awareness: Online awareness impaired Memory impairment (select all impairments): Short-term memory, Working [employer] fast streamer, Conservation officer, historic buildings, Non-declarative long-term memory Attention impairment (select first level of impairment): Divided attention, Sustained attention Executive functioning impairment (select all impairments): Initiation, Organization, Sequencing, Reasoning, Problem solving          Following commands: Impaired Following commands impaired: Follows one step commands inconsistently      Cueing   Cueing Techniques: Verbal cues, Gestural cues, Tactile cues, Visual cues  Exercises              Pertinent Vitals/ Pain       Pain Assessment Pain Assessment: Faces Pain Score: 3  Pain Location: back and abdomen with activity Pain Descriptors / Indicators: Discomfort Pain Intervention(s): Limited activity within patient's tolerance, Monitored during session, Repositioned   Frequency  Min 2X/week        Progress Toward Goals  OT Goals(current goals can now be found in the care plan section)  Progress towards OT goals: Progressing toward goals  Acute Rehab OT Goals OT Goal Formulation: Patient unable to participate in goal setting Time For Goal Achievement: 07/15/24 Potential to Achieve Goals: Fair  Plan         AM-PAC OT 6 Clicks Daily Activity     Outcome Measure   Help from another person eating meals?: A Little Help from another person taking care of personal grooming?: A Little Help from another person toileting, which includes using toliet, bedpan, or urinal?: A Lot Help from another person bathing (including washing, rinsing, drying)?: A Lot Help from another person to put on and taking off regular upper body clothing?: A Lot Help from another person to put on and taking off regular lower body clothing?: A Lot 6 Click Score: 14    End of Session Equipment Utilized During  Treatment: Gait belt  OT Visit Diagnosis: Unsteadiness on feet (R26.81);Other abnormalities of gait and mobility (R26.89);Muscle weakness (generalized) (M62.81);Other symptoms and signs involving cognitive function;Pain Pain - part of body:  (abdomen)   Activity Tolerance Patient tolerated treatment well   Patient Left in chair;with call bell/phone within reach;with family/visitor present;with chair alarm set   Nurse Communication Mobility status        Time: 8883-8858 OT Time Calculation (min): 25 min  Charges: OT General Charges $OT Visit: 1 Visit OT Treatments $Self Care/Home Management : 8-22 mins $Therapeutic Activity: 8-22 mins     Lindsay Mason, OTR/L 07/02/2024, 3:05 PM

## 2024-07-02 NOTE — Progress Notes (Signed)
 Nutrition Follow-up  DOCUMENTATION CODES:   Non-severe (moderate) malnutrition in context of chronic illness  INTERVENTION:   -TPN management per Pharmacy -weaning  -PO as tolerated -Continue Ensure Plus High Protein po BID, each supplement provides 350 kcal and 20 grams of protein. +milk to lessen sweetness   -Continue Magic cup TID with meals, each supplement provides 290 kcal and 9 grams of protein    -Continue family or staff assistance with meals   NUTRITION DIAGNOSIS:   Moderate Malnutrition related to chronic illness (dementia) as evidenced by moderate fat depletion, severe muscle depletion.  Ongoing.  GOAL:   Patient will meet greater than or equal to 90% of their needs  MONITOR:   PO intake, Supplement acceptance  REASON FOR ASSESSMENT:   Consult Assessment of nutrition requirement/status, Calorie Count  ASSESSMENT:   73 y.o. 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 Currently being treated for small bowel obstruction/ileus with NG tube and IV fluids.  Patient ate breakfast this morning and is accepting Ensure. Pt continues TPN but will be decreased to half rate, 40 ml/hr.  Per SLP note, provided strategies to help with self feeding. Pt with risk of aspiration given dementia and cognition is the biggest barrier to eating.  TPN to d/c prior to discharge and pt's family has decided against PEG which RD agrees with as this will not improve outcomes or QOL.  Previous calorie counts have shown at best pt can meet ~50% of needs. Recommend assisting pt with intakes and encourage supplements like Ensure and Magic cups as much as possible.   Admission weight: 135 lbs Current weight: 140  lbs  Medications: Colace, Miralax   Labs reviewed: CBGs: 78-105   Diet Order:   Diet Order             Diet regular Room service appropriate? Yes; Fluid consistency: Thin  Diet effective now                   EDUCATION NEEDS:   Education needs have been addressed  Skin:  Skin Assessment: Skin Integrity Issues: Skin Integrity Issues:: Incisions Incisions: 8/27 abdomen  Last BM:  9/18  Height:   Ht Readings from Last 1 Encounters:  06/02/24 5' 2 (1.575 m)    Weight:   Wt Readings from Last 1 Encounters:  07/02/24 63.8 kg    BMI:  Body mass index is 25.73 kg/m.  Estimated Nutritional Needs:   Kcal:  1650-1850  Protein:  75-90g  Fluid:  1.8L/day   Morna Lee, MS, RD, LDN Inpatient Clinical Dietitian Contact via Secure chat

## 2024-07-02 NOTE — TOC Progression Note (Addendum)
 Transition of Care [facility]) - Progression Note    Patient Details  Name: Lindsay Mason MRN: [id number] Date of Birth: [birth year]  Transition of [facility]) CM/SW Contact  Elric Tirado, Nathanel, RN Phone Number: 07/02/2024, 1:30 PM  Clinical Narrative:see prior notes additional notes.Noted still on TPN. Left vm w/[name](dtr) to discuss continued d/c plans-ST SNF-will await to fax out once TPN weaned, & closer to medical stability. -2p spoke to [name](dtr)still declines info to be faxed to [facility], or [place]. [name] states she does not want PEG placed. Noted palliative care meeting. Continue to follow.       Expected Discharge Plan: Skilled Nursing Facility Barriers to Discharge: Continued Medical Work up               Expected Discharge Plan and Services In-house Referral: Clinical Social Work Discharge Planning Services: CM Consult Post Acute Care Choice: Skilled Nursing Facility                   DME Arranged: N/A DME Agency: NA                   Social Drivers of Health (SDOH) Interventions SDOH Screenings   Food Insecurity: No Food Insecurity (06/02/2024)  Housing: Low Risk  (06/02/2024)  Transportation Needs: No Transportation Needs (06/02/2024)  Utilities: Not At Risk (06/02/2024)  Alcohol Screen: Low Risk  (05/05/2024)  Depression (PHQ2-9): High Risk (05/07/2024)  Financial Resource Strain: Low Risk  (05/05/2024)  Physical Activity: Sufficiently Active (05/05/2024)  Social Connections: Moderately Isolated (06/02/2024)  Stress: No Stress Concern Present (05/05/2024)  Tobacco Use: Medium Risk (06/09/2024)  Health Literacy: Inadequate Health Literacy (05/05/2024)    Readmission Risk Interventions    06/14/2024    2:25 PM 01/11/2024   10:01 AM  Readmission Risk Prevention Plan  Post Dischage Appt  Complete  Medication Screening  Complete  Transportation Screening Complete Complete  Medication Review Oceanographer) Complete   HRI or Home Care Consult Complete    SW Recovery Care/Counseling Consult Complete   Palliative Care Screening Not Applicable   Skilled Nursing Facility Complete

## 2024-07-02 NOTE — Progress Notes (Signed)
 Daily Progress Note   Patient Name: Lindsay Mason       Date: 07/02/2024 DOB: November 05, 1950  Age: 73 y.o. MRN#: [id number] Attending Physician: Dorinda Drue DASEN, MD Primary Care Physician: [name], MD Admit Date: 06/02/2024  Reason for Consultation/Follow-up: Establishing goals of care and Non pain symptom management  Subjective:  Awake alert, sitting up in bed, has several pieces of [place] in her hand, family member arrived at bedside at time of visit Have also corresponded with pharmacy regarding TPN, with TRH MD as well as with TOC.     Length of Stay: 30  Current Medications: Scheduled Meds:   Chlorhexidine  Gluconate Cloth  6 each Topical Daily   docusate sodium   100 mg Oral BID   enoxaparin  (LOVENOX ) injection  40 mg Subcutaneous Q24H   feeding supplement  237 mL Oral BID BM   Gerhardt's butt cream   Topical BID   insulin  aspart  0-9 Units Subcutaneous Q6H   methocarbamol  (ROBAXIN ) injection  500 mg Intravenous Q8H   nortriptyline   25 mg Oral QHS   polyethylene glycol  17 g Oral Daily   QUEtiapine   50 mg Oral QAC supper   rivastigmine   9.5 mg Transdermal Daily   sodium chloride  flush  10-40 mL Intracatheter Q12H    Continuous Infusions:  TPN ADULT (ION) 80 mL/hr at 07/01/24 1832   TPN ADULT (ION)      PRN Meds: acetaminophen  (TYLENOL ) oral liquid 160 mg/5 mL, albuterol , haloperidol  lactate, hydrALAZINE , loperamide , morphine  injection, [DISCONTINUED] ondansetron  **OR** ondansetron  (ZOFRAN ) IV, oxyCODONE , phenol, sodium chloride  flush  Physical Exam         Elderly lady resting in bed No acute distress Regular work of breathing Answers a few questions appropriately  Vital Signs: BP 116/72 (BP Location: Left Arm)   Pulse 95   Temp (!) 97.5 F (36.4 C) (Oral)    Resp 18   Ht 5' 2 (1.575 m)   Wt 63.8 kg   SpO2 99%   BMI 25.73 kg/m  SpO2: SpO2: 99 % O2 Device: O2 Device: Room Air O2 Flow Rate: O2 Flow Rate (L/min): 2 L/min  Intake/output summary:  Intake/Output Summary (Last 24 hours) at 07/02/2024 1426 Last data filed at 07/02/2024 1300 Gross per 24 hour  Intake 2617.02 ml  Output 250 ml  Net 2367.02  ml   LBM: Last BM Date : 07/01/24 Baseline Weight: Weight: 61.3 kg Most recent weight: Weight: 63.8 kg       Palliative Assessment/Data:      Patient Active Problem List   Diagnosis Date Noted   Goals of care, counseling/discussion 06/28/2024   Malnutrition of moderate degree 06/23/2024   S/P small bowel resection 06/23/2024   Hypophosphatemia 06/22/2024   Hypokalemia 06/22/2024   Perforated of jejunal diverticulitis s/p SB resection 06/09/2024 06/09/2024   Nausea and vomiting 06/02/2024   Hyperlipidemia 06/02/2024   Abnormal EKG 06/02/2024   Transaminitis 05/09/2024   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 03/02/2024   Fall with injury 02/05/2024   Multiple closed fractures of ribs of left side with routine healing 02/05/2024   Urinary retention 02/05/2024   Non-small cell carcinoma of left lung, stage 1 (HCC) 01/20/2024   Pneumothorax 01/10/2024   PAD (peripheral artery disease) (HCC) 12/06/2023   UTI due to extended-spectrum beta lactamase (ESBL) producing Escherichia coli 12/06/2023   Acid reflux 11/06/2023   Sundowning 11/06/2023   Medicare annual wellness visit, subsequent 05/01/2022   Vitamin B12 deficiency 01/22/2021   History of essential hypertension 01/21/2021   Severe early onset Alzheimer dementia (HCC) 01/21/2021   History of primary malignant neoplasm of right lung 01/20/2020   S/P lobectomy of lung 01/20/2020   Advanced care planning/counseling discussion 06/26/2017   Degenerative scoliosis 06/26/2017   COPD with emphysema (HCC) 02/12/2016   Thoracic aortic atherosclerosis (HCC) 02/12/2016   Health  maintenance examination 02/01/2016   Weight loss 02/01/2016   Microscopic colitis 06/05/2015   Abnormal MRI of head 10/02/2011   Headache 08/06/2011   Ex-smoker 04/30/2007   Osteopenia 04/30/2007    Palliative Care Assessment & Plan   Patient Profile:    Assessment: 73 year old lady with COPD dyslipidemia 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, history of dementia with behavioral disturbances, history of peripheral arterial disease thoracic and abdominal aortic aneurysm Admitted to hospital medicine service with positive blood cultures with staph species, CT abdomen concerning for small bowel ischemia patient underwent ex lap with small bowel resection on 06-09-2024 Postop course complicated by minimal oral intake and on 9-3 patient underwent drain placement because repeat CT scan showed enhancing fluid collections in the right paracolic gutter worrisome for abscesses Palliative consultation for CODE STATUS, broad goals of care discussions as well as for discussions with regards to artificial nutrition and hydration has been requested. After a time-limited trial of current interventions including calorie count for the past 24-48 hours, a family meeting took place-see below.  Recommendations/Plan: DNR DNI Continue efforts at TPN wean, increased Po intake, appreciate SLP eval and recommendations SNF rehab with palliative on discharge.   PMT to follow, monitor efforts at PT OT and PO intake.   Code Status:    Code Status Orders  (From admission, onward)           Start     Ordered   07/01/24 1256  Do not attempt resuscitation (DNR)- Limited -Do Not Intubate (DNI)  (Code Status)  Continuous       Question Answer Comment  If pulseless and not breathing No CPR or chest compressions.   In Pre-Arrest Conditions (Patient Is Breathing and Has A Pulse) Do not intubate. Provide all appropriate non-invasive medical interventions. Avoid ICU transfer unless  indicated or required.   Consent: Discussion documented in EHR or advanced directives reviewed      07/01/24 1255  Code Status History     Date Active Date Inactive Code Status Order ID Comments User Context   06/02/2024 1114 07/01/2024 1255 Full Code [id number]  Celinda Alm Lot, MD ED   01/09/2024 1020 01/17/2024 1852 Full Code [id number]  Zella Katha HERO, MD ED   01/20/2020 1539 01/22/2020 1707 Full Code [id number]  Lindia Laurel MATSU, PA-C Inpatient   12/31/2011 0156 01/01/2012 1602 Full Code [id number]  Harvey Zebedee RAMAN, RN Inpatient       Prognosis:  < 12 months  Discharge Planning: Skilled Nursing Facility for rehab with Palliative care service follow-up  Care plan was discussed with patient, family member at bedside, [facility] MD and SLP.   Thank you for allowing the Palliative Medicine Team to assist in the care of this patient.  High MDM     Greater than 50%  of this time was spent counseling and coordinating care related to the above assessment and plan.  Lonia Serve, MD  Please contact Palliative Medicine Team phone at [phone] for questions and concerns.

## 2024-07-02 NOTE — Progress Notes (Signed)
 Speech Language Pathology Treatment: Dysphagia  Patient Details Name: Lindsay Mason MRN: [id number] DOB: Sep 30, 1951 Today's Date: 07/02/2024 Time: 1750-1810 SLP Time Calculation (min) (ACUTE ONLY): 20 min  Assessment / Plan / Recommendation Clinical Impression  Education conducted with pt's son, [name], and spouse, Todd, regarding maximizing nutrition as safely as possible with her cognitive disorder. Son reports I saw your signs and I have noticed those things are helpful. He then states See, I was right. Son reports that prior to admit, he would encourage the pt to feed herself with minimizing distractions. [name] acknowledges finger foods are easier for her to eat. [name] reported observation of pt being fed large bites at times in efforts to improve intake - with the best of intentions - but is aware this increases her aspiration and discomfort risk.   Discussed recommendation to honor pt when she states she is full or has nausea and frequently offering intake may be helpful to compensate for this issue. Advised that the last taste sensation is for *sweet*. Parallel eating may help her mimic eating behavior and improve intake as well. Encouraging self feeding - even hand over hand - may improve intake and definitely will improve patient safety.    Son and spouse agreeable to precautions and are satisified with recommendations. No SLP follow up indicated. Thanks so much for allowing this SLP to help with this pt care plan.   HPI HPI: Pt is 73 yo female presented on 06/02/24  with + blood culture with staph species, lethargy, and worsening mental status. Pt admitted with SIRS, abdominal pain, N/V likely secondary to acute diverticulitis, and SBO vs ileus.  Pt with NG tube and surgery following. s/p exploratory lap with SB resection 2* ischemic bowel 06/09/24.   Pt also with metabolic encephalopathy on background of Alzheimer's dementia.  Other hx includes but not limited to  COPD, hyperlipidemia,  history of right lower lobe adenocarcinoma treated with lobectomy in 2022, peripheral arterial disease, thoracic and abdominal aortic aneurysm status, dementia with behavioral disturbance.  Family/pt seen for follow up for education re: dysphagia and dementia and ways to maximize nutrition with least risk.      SLP Plan  All goals met          Recommendations  Diet recommendations: Regular;Thin liquid Liquids provided via: Cup;Straw Medication Administration: Other (Comment) (as tolerated) Supervision: Intermittent supervision to cue for compensatory strategies Compensations: Slow rate;Small sips/bites;Other (Comment) (hand over hand assist) Postural Changes and/or Swallow Maneuvers: Seated upright 90 degrees;Upright 30-60 min after meal                  Oral care BID   Frequent or constant Supervision/Assistance Other (comment)     All goals met     Nicolas Emmie Caldron  07/02/2024, 6:40 PM

## 2024-07-02 NOTE — Progress Notes (Signed)
 Progress Note   Patient: Lindsay Mason FMW:[id number] DOB: [birth year] DOA: 06/02/2024     30 DOS: the patient was seen and examined on 07/02/2024     Hospital Course: From HPI Ms. Oien is a 73 year old female with past medical history significant for COPD, hyperlipidemia, history of right lower lobe adenocarcinoma treated with lobectomy in 2022, peripheral artery disease, thoracic and abdominal aortic aneurysm, and dementia with behavioral disturbance.     Patient presented to the hospital with positive blood culture with staph species.  Patient had lethargy and worsening mental status.  In the ED patient had slightly low blood pressure.  White blood cell elevated at 13.  CT abdomen and pelvis done on 06/01/2024 revealed no acute finding.  Patient was admitted to the hospital for further evaluation and treatment.  During hospitalization, patient continued to have abdominal pain and distention.  General surgery was consulted.   Repeat CT scanning of the abdomen and pelvis with contrast done on 06/08/2024 revealed severely thickened and inflamed central pelvic loops of small bowel concerning for ischemia. Complex appearance due to indistinct bowel wall margins, surrounding edema, dilated small bowel extending towards these loops, and at least some sense of twisting of bowel along adjacent branches of the superior mesenteric vein concerning for mesenteric volvulus or closed loop obstruction.   Patient eventually underwent exploratory laparotomy with a small bowel resection 06/09/2024.  Patient is currently on TPN, with very poor oral intake.  Repeat CT scan 06/15/2024 revealed new enhancing fluid collections in the right paracolic gutter and central pelvis worrisome for abscesses. IR consulted for drain placement. Underwent drain placement on 9/03.     Assessment & Plan:   Sepsis - resolved  Concern for SB ischemia vs SBO vs volvulus with possible microperforation -Patient initially had  leukocytosis with hypotension and lethargy - Followed by surgery and taken to the OR on 06/09/2024 for diagnostic laparoscopy, exploratory laparotomy with small bowel resection --Treated initially with  cefepime  and metronidazole  14 days.  Blood cultures negative. - repeat CT 9/7 showing new fluid collection RLQ 4.1 x 2.6 cm and persistent central pelvic collection measuring 3.6 x 3.5 cm - Remains afebrile with resolved leukocytosis off antibiotics Currently on TPN Palliative care on board and discussing goals of care. Palliative care had a family discussion on 07/01/2024 CODE STATUS have been changed to DNR. Also family have agreed for TPN to be weaned off in the next 48 hours. Family opted to trial oral intake as patient tolerates, versus placement of G-tube for artificial feeding. Speech therapist following closely   Metabolic Encephalopathy Alzheimer's dementia - Presumed worsening in setting of acute infection; son states encephalopathy worsens when she is sick -Continue delirium precautions - trial of seroquel ; avoiding over sedating meds as able unless necessary - PRN haldol     Hypophosphatemia - repleted   Hypokalemia - Repleted   Blood culture contamination - 06/01/2024 culture noted with Staph hominis, presumed contaminant.  No further workup   COPD with emphysema  Continue albuterol  inhaler   Hyperlipidemia PAD (peripheral artery disease)  Thoracic aortic atherosclerosis -Holding Lipitor   History of essential hypertension On as needed IV hydralazine    Weakness, deconditioning.   - Seen by PT OT and recommended skilled nursing facility placement    Dysphagia  - s/p SLP eval - continue regular diet       Antimicrobials: Meropenem .  Discontinued 06/23/2024   DVT prophylaxis:  enoxaparin  (LOVENOX ) injection 40 mg Start: 06/16/24 2100 SCDs Start: 06/02/24 1114  Code Status:   Code Status: DNR     Disposition Plan: Family prefers SNF for short-term  rehabilitation post discharge. TOC consult placed   HH orders placed: N/A Status is: Inpatient     Physical Exam Constitutional:      Comments: Pleasant elderly woman lying in bed pleasantly confused  HENT:     Head: Normocephalic and atraumatic.     Mouth/Throat:     Mouth: Mucous membranes are moist.  Eyes:     Extraocular Movements: Extraocular movements intact.  Cardiovascular:     Rate and Rhythm: Normal rate and regular rhythm.  Pulmonary:     Effort: Pulmonary effort is normal. No respiratory distress.     Breath sounds: Normal breath sounds. No wheezing.  Abdominal:     General: Bowel sounds are normal. There is no distension.     Palpations: Abdomen is soft.     Tenderness: There is no abdominal tenderness.     Comments: Midline abdominal incision noted with surgical dressing in place.  Wound is open with secondary intention  Musculoskeletal:        General: Normal range of motion.     Cervical back: Normal range of motion and neck supple.  Skin:    General: Skin is warm and dry.  Neurological:     Mental Status: Mental status is at baseline. She is disoriented.      Consultants:  General surgery Palliative care    Procedures:  06/09/2024: Diagnostic laparoscopy, exploratory laparotomy with small bowel resection   Subjective: Denies any acute overnight events   Data Reviewed:      Latest Ref Rng & Units 07/01/2024    2:42 AM 06/28/2024    2:15 AM 06/26/2024    3:37 AM  BMP  Glucose 70 - 99 mg/dL 93  897  892   BUN 8 - 23 mg/dL 25  25  24    Creatinine 0.44 - 1.00 mg/dL 9.51  9.57  9.56   Sodium 135 - 145 mmol/L 141  141  140   Potassium 3.5 - 5.1 mmol/L 4.0  4.1  4.1   Chloride 98 - 111 mmol/L 109  109  105   CO2 22 - 32 mmol/L 21  22  23    Calcium  8.9 - 10.3 mg/dL 9.0  9.1  9.2        Latest Ref Rng & Units 07/01/2024    2:42 AM 06/28/2024    2:15 AM 06/27/2024    3:12 AM  CBC  WBC 4.0 - 10.5 K/uL 5.3  7.3  10.0   Hemoglobin 12.0 - 15.0 g/dL 8.5   8.7  8.9   Hematocrit 36.0 - 46.0 % 28.1  28.2  28.0   Platelets 150 - 400 K/uL 372  432  476      Vitals:   07/01/24 1505 07/01/24 2224 07/02/24 0556 07/02/24 1432  BP: (!) 144/107 107/61 116/72 107/77  Pulse: 94 (!) 102 95 99  Resp: 20 20 18 18   Temp: 98.3 F (36.8 C) 99 F (37.2 C) (!) 97.5 F (36.4 C) 98.5 F (36.9 C)  TempSrc: Oral Oral Oral   SpO2: 100% 98% 99% 96%  Weight:   63.8 kg   Height:         Author: Drue ONEIDA Potter, MD 07/02/2024 2:54 PM  For on call review [email or web address].

## 2024-07-02 NOTE — Evaluation (Addendum)
 Clinical/Bedside Swallow Evaluation Patient Details  Name: Lindsay Mason MRN: [id number] Date of Birth: 1950-12-19  Today's Date: 07/02/2024 Time: SLP Start Time (ACUTE ONLY): 0900 SLP Stop Time (ACUTE ONLY): 0926 SLP Time Calculation (min) (ACUTE ONLY): 26 min  Past Medical History:  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   by CT - moderate centrilobular and paraseptal   Ex-smoker 08/2014   currently using e cig   Microscopic colitis 06/2015   lymphocytic by colonoscopy - started entocort [name])   Osteoporosis, unspecified    Scoliosis of lumbar spine    Thoracic aortic atherosclerosis (HCC) 02/2016   by CT   Past Surgical History:  Past Surgical History:  Procedure Laterality Date   CATARACT EXTRACTION W/PHACO Right 02/21/2021   Procedure: CATARACT EXTRACTION PHACO AND INTRAOCULAR LENS PLACEMENT (IOC) RIGHT;  Surgeon: Mittie Gaskin, MD;  Location: [facility];  Service: Ophthalmology;  Laterality: Right;  cde 4.75 00:41.1 minutes 11.5%   CATARACT EXTRACTION W/PHACO Left 03/14/2021   Procedure: CATARACT EXTRACTION PHACO AND INTRAOCULAR LENS PLACEMENT (IOC) LEFT 4.07 00:44.6;  Surgeon: Mittie Gaskin, MD;  Location: [facility];  Service: Ophthalmology;  Laterality: Left;   COLONOSCOPY  06/2015   microscopic colitis [name])   DEXA  10/2008   Osteoporosis   DEXA  02/2014   T score osteopenia hip, osteoporosis spine, scoliosis   ELECTROMAGNETIC NAVIGATION BROCHOSCOPY  01/20/2020   Procedure: ELECTROMAGNETIC NAVIGATION BRONCHOSCOPY;  Surgeon: Brenna Adine CROME, DO;  Location: [facility];  Service: Pulmonary;;   ETT  10/2011   WNL, no evidence of ischemia, excellent exercise tolerance   EXPLORATORY LAPAROTOMY     INTERCOSTAL NERVE BLOCK Right 01/20/2020   Procedure: Intercostal Nerve  Block;  Surgeon: Shyrl Linnie KIDD, MD;  Location: [facility];  Service: Thoracic;  Laterality: Right;   LAPAROSCOPY N/A 06/09/2024   Procedure: LAPAROSCOPY, DIAGNOSTIC, EXPLORATORY LAPAROTOMY WITH SMALL BOWEL RESECTION;  Surgeon: Curvin Deward MOULD, MD;  Location: [facility];  Service: General;  Laterality: N/A;  Possible laparotomy   MRI head  07/2011   multiple foci deep and subcortical white matter, chronic ischemic vs demyelinating, no active disease   NODE DISSECTION  01/20/2020   Procedure: Node Dissection;  Surgeon: Shyrl Linnie KIDD, MD;  Location: [facility];  Service: Thoracic;;   SUBMUCOSAL TATTOO INJECTION  01/20/2020   Procedure: SUBSTITIAL TATTOO INJECTION;  Surgeon: Brenna Adine CROME, DO;  Location: [facility];  Service: Pulmonary;;   TONSILLECTOMY AND ADENOIDECTOMY     Vein removal     removed from legs   VIDEO ASSISTED THORACOSCOPY (VATS)/WEDGE RESECTION Right 01/20/2020   XI ROBOTIC ASSISTED THORASCOPY-RIGHT LOWER LOBE WEDGE RESECTION, RIGHT LOWER LOBECTOMYRightGeneral   VIDEO BRONCHOSCOPY  01/20/2020   Procedure: VIDEO BRONCHOSCOPY WITH FLUORO;  Surgeon: Brenna Adine CROME, DO;  Location: [facility];  Service: Pulmonary;;   HPI:  Pt is 73 yo female presented on 06/02/24  with + blood culture with staph species, lethargy, and worsening mental status. Pt admitted with SIRS, abdominal pain, N/V likely secondary to acute diverticulitis, and SBO vs ileus.  Pt with NG tube and surgery following. s/p exploratory lap with SB resection 2* ischemic bowel 06/09/24.   Pt also with metabolic encephalopathy on background of Alzheimer's dementia.  Other hx includes but not limited to  COPD,  hyperlipidemia, history of right lower lobe adenocarcinoma treated with lobectomy in 2022, peripheral arterial disease, thoracic and abdominal aortic aneurysm status, dementia with behavioral disturbance    Assessment / Plan / Recommendation  Clinical Impression  Pt presents with a functional oropharyngeal swallow per  clinical swallow assessment completed today.  Oral prep and transit prompt with complete oral clearance. Pharyngeal swallow initiation appeared timely with laryngeal elevation noted. No overt or subtle s/s of aspiration noted across consistencies. Pt's largest barrier to po intake is due to her cognition/impaired sustained attention.  Even when SLP placed cup in her hand, she did not automatically bring it to her mouth.  Hand over hand assist helpful to engage for po.  Pt is at an increased risk of aspiration secondary to her dementia impacting her ability.    Encouraging self feeding even with hand over hand assist to engage proprioception, maximize finger food intake for easier self feeding, decreasing distractions *turning off tv, closing door, etc*, liquid supplements, presenting foods one at a time *distracted by too many items on the tray* may be helpful to maximize nutrition with least risk and modifying for gustatory changes *sweet last taste present*.     Pt reported feeling like she was going to throw up during po - thus SLP informed RN and RN provided pt Zofran .   Recommend continue diet as tolerated with precautions and allowing rest when pt complain of nausea.    Hopefully pt's intake will improve after TPN stopped.  SLP will follow up x1 for family education re: dementia and eating/drinking. Note pt TPN will stop and family has decided not to pursue PEG.    Posted signs in the room to help pt with attention to eating task. SLP Visit Diagnosis: Dysphagia, unspecified (R13.10)    Aspiration Risk  Mild aspiration risk    Diet Recommendation Regular;Thin liquid    Liquid Administration via: Cup;Straw Medication Administration: Other (Comment) (as tolerated) Compensations: Slow rate;Small sips/bites Postural Changes: Seated upright at 90 degrees;Remain upright for at least 30 minutes after po intake    Other  Recommendations Oral Care Recommendations: Oral care BID     Assistance  Recommended at Discharge  full  Functional Status Assessment Patient has had a recent decline in their functional status and/or demonstrates limited ability to make significant improvements in function in a reasonable and predictable amount of time  Frequency and Duration min 1 x/week  1 week       Prognosis Prognosis for improved oropharyngeal function: Fair Barriers to Reach Goals: Other (Comment);Cognitive deficits      Swallow Study   General Date of Onset: 06/17/24 HPI: Pt is 73 yo female presented on 06/02/24  with + blood culture with staph species, lethargy, and worsening mental status. Pt admitted with SIRS, abdominal pain, N/V likely secondary to acute diverticulitis, and SBO vs ileus.  Pt with NG tube and surgery following. s/p exploratory lap with SB resection 2* ischemic bowel 06/09/24.   Pt also with metabolic encephalopathy on background of Alzheimer's dementia.  Other hx includes but not limited to  COPD, hyperlipidemia, history of right lower lobe adenocarcinoma treated with lobectomy in 2022, peripheral arterial disease, thoracic and abdominal aortic aneurysm status, dementia with behavioral disturbance Type of Study: Bedside Swallow Evaluation Previous Swallow Assessment: none per chart Diet Prior to this Study: Regular;Thin liquids (Level 0) Temperature Spikes Noted: No Respiratory Status: Room air History of Recent Intubation: No Behavior/Cognition: Alert;Requires cueing;Confused Oral Cavity Assessment: Within Functional Limits Oral Care  Completed by SLP: No Oral Cavity - Dentition: Adequate natural dentition Vision: Impaired for self-feeding Self-Feeding Abilities: Total assist Patient Positioning: Upright in bed Baseline Vocal Quality: Normal Volitional Cough: Cognitively unable to elicit Volitional Swallow: Unable to elicit    Oral/Motor/Sensory Function Overall Oral Motor/Sensory Function: Within functional limits   Ice Chips Ice chips: Not tested   Thin  Liquid Thin Liquid: Within functional limits Presentation: Straw;Cup    Nectar Thick Nectar Thick Liquid: Not tested   Honey Thick Honey Thick Liquid: Not tested   Puree Puree: Within functional limits Presentation: Spoon   Solid     Solid: Within functional limits Presentation: Spoon;Self Fed      Lindsay Mason 07/02/2024,9:50 AM  Lindsay POUR, MS [facility] SLP Acute Rehab Services Office [phone]

## 2024-07-02 NOTE — Plan of Care (Signed)
  Problem: Fluid Volume: Goal: Hemodynamic stability will improve Outcome: Progressing   Problem: Clinical Measurements: Goal: Signs and symptoms of infection will decrease Outcome: Progressing   Problem: Respiratory: Goal: Ability to maintain adequate ventilation will improve Outcome: Progressing   Problem: Clinical Measurements: Goal: Will remain free from infection Outcome: Progressing Goal: Diagnostic test results will improve Outcome: Progressing Goal: Respiratory complications will improve Outcome: Progressing Goal: Cardiovascular complication will be avoided Outcome: Progressing   Problem: Coping: Goal: Level of anxiety will decrease Outcome: Progressing   Problem: Elimination: Goal: Will not experience complications related to bowel motility Outcome: Progressing Goal: Will not experience complications related to urinary retention Outcome: Progressing   Problem: Pain Managment: Goal: General experience of comfort will improve and/or be controlled Outcome: Progressing   Problem: Safety: Goal: Ability to remain free from injury will improve Outcome: Progressing   Problem: Skin Integrity: Goal: Risk for impaired skin integrity will decrease Outcome: Progressing   Problem: Education: Goal: Ability to describe self-care measures that may prevent or decrease complications (Diabetes Survival Skills Education) will improve Outcome: Progressing Goal: Individualized Educational Video(s) Outcome: Progressing   Problem: Coping: Goal: Ability to adjust to condition or change in health will improve Outcome: Progressing   Problem: Fluid Volume: Goal: Ability to maintain a balanced intake and output will improve Outcome: Progressing   Problem: Health Behavior/Discharge Planning: Goal: Ability to identify and utilize available resources and services will improve Outcome: Progressing Goal: Ability to manage health-related needs will improve Outcome: Progressing    Problem: Metabolic: Goal: Ability to maintain appropriate glucose levels will improve Outcome: Progressing   Problem: Skin Integrity: Goal: Risk for impaired skin integrity will decrease Outcome: Progressing   Problem: Tissue Perfusion: Goal: Adequacy of tissue perfusion will improve Outcome: Progressing   Problem: Clinical Measurements: Goal: Diagnostic test results will improve Outcome: Not Progressing   Problem: Education: Goal: Knowledge of General Education information will improve Description: Including pain rating scale, medication(s)/side effects and non-pharmacologic comfort measures Outcome: Not Progressing   Problem: Health Behavior/Discharge Planning: Goal: Ability to manage health-related needs will improve Outcome: Not Progressing   Problem: Clinical Measurements: Goal: Ability to maintain clinical measurements within normal limits will improve Outcome: Not Progressing   Problem: Activity: Goal: Risk for activity intolerance will decrease Outcome: Not Progressing   Problem: Nutrition: Goal: Adequate nutrition will be maintained Outcome: Not Progressing   Problem: Nutritional: Goal: Maintenance of adequate nutrition will improve Outcome: Not Progressing Goal: Progress toward achieving an optimal weight will improve Outcome: Not Progressing

## 2024-07-02 NOTE — Progress Notes (Signed)
 Physical Therapy Treatment Patient Details Name: Lindsay Mason MRN: [id number] DOB: [birth year] Today's Date: 07/02/2024   History of Present Illness Pt is 73 yo female presented on 06/02/24  with + blood culture with staph species, lethargy, and worsening mental status. Pt admitted with SIRS, abdominal pain, N/V likely secondary to acute diverticulitis, and SBO vs ileus.  Pt with NG tube and surgery following. s/p exploratory lap with SB resection 2* ischemic bowel 06/09/24.  Drain placement for pelvic abscess 06/16/24. Currently on TPN.  Pt also with metabolic encephalopathy on background of Alzheimer's dementia.  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  Needs frequent redirecting to task at hands and cues for safety.  Continue POC.  Noting in Palliative note that pt was changed to DNR and family still prefers SNF at d/c due to weakness and is home with spouse (unable to physically assist) and family available nearby.     If plan is discharge home, recommend the following: A lot of help with bathing/dressing/bathroom;Assistance with cooking/housework;Assist for transportation;Help with stairs or ramp for entrance;Direct supervision/assist for medications management;Direct supervision/assist for financial management;Supervision due to cognitive status;A lot of help with walking and/or transfers   Can travel by private vehicle     No  Equipment Recommendations  None recommended by PT    Recommendations for Other Services       Precautions / Restrictions Precautions Precautions: Fall Other Brace: abdominal binder     Mobility  Bed Mobility Overal bed mobility: Needs Assistance Bed Mobility:  Sit to Supine       Sit to supine: Min assist        Transfers Overall transfer level: Needs assistance Equipment used: 1 person hand held assist Transfers: Sit to/from Stand Sit to Stand: Min assist           General transfer comment: Min A to initiate    Ambulation/Gait Ambulation/Gait assistance: Min Chemical engineer (Feet): 120 Feet Assistive device: 2 person hand held assist Gait Pattern/deviations: Step-through pattern, Decreased stride length, Drifts right/left Gait velocity: decreased     General Gait Details: Min cues for direction and balance, HHA to facilitate forward momentum   Stairs             Wheelchair Mobility     Tilt Bed    Modified Rankin (Stroke Patients Only)       Balance Overall balance assessment: Needs assistance Sitting-balance support: No upper extremity supported, Feet supported Sitting balance-Leahy Scale: Fair     Standing balance support: Single extremity supported Standing balance-Leahy Scale: Poor Standing balance comment: CGA to min assist                            Communication Communication Factors Affecting Communication: Difficulty expressing self  Cognition Arousal: Alert Behavior During Therapy: WFL for tasks assessed/performed   PT - Cognitive impairments: History of cognitive impairments, Orientation, Awareness, Memory, Attention, Initiation, Sequencing, Problem solving, Safety/Judgement                       PT - Cognition Comments: Oriented to self and family only; follows simple commands inconsistenlty -improved with multimodal  cues, pt happy and joking today; easily distracted needing redirecting Following commands: Impaired Following commands impaired: Follows one step commands inconsistently    Cueing Cueing Techniques: Verbal cues, Gestural cues, Tactile cues, Visual cues  Exercises      General Comments General comments (skin integrity, edema, etc.): VSS       Pertinent Vitals/Pain Pain Assessment Pain Assessment: Faces Faces Pain Scale: No hurt    Home Living                          Prior Function            PT Goals (current goals can now be found in the care plan section) Progress towards PT goals: Progressing toward goals    Frequency    Min 2X/week      PT Plan      Co-evaluation              AM-PAC PT 6 Clicks Mobility   Outcome Measure  Help needed turning from your back to your side while in a flat bed without using bedrails?: A Little Help needed moving from lying on your back to sitting on the side of a flat bed without using bedrails?: A Little Help needed moving to and from a bed to a chair (including a wheelchair)?: A Lot (mod cues for all below) Help needed standing up from a chair using your arms (e.g., wheelchair or bedside chair)?: A Lot Help needed to walk in hospital room?: A Lot Help needed climbing 3-5 steps with a railing? : A Lot 6 Click Score: 14    End of Session Equipment Utilized During Treatment: Gait belt Activity Tolerance: Patient tolerated treatment well Patient left: with call bell/phone within reach;with family/visitor present;in bed;with bed alarm set (At least 6 family members present) Nurse Communication: Mobility status PT Visit Diagnosis: Difficulty in walking, not elsewhere classified (R26.2);Unsteadiness on feet (R26.81)     Time: 8481-8466 PT Time Calculation (min) (ACUTE ONLY): 15 min  Charges:    $Gait Training: 8-22 mins PT General Charges $$ ACUTE PT VISIT: 1 Visit                     Lindsay, PT Acute Rehab Services [facility] [phone]    Lindsay Mason 07/02/2024, 3:55 PM

## 2024-07-02 NOTE — Progress Notes (Addendum)
 PHARMACY - TOTAL PARENTERAL NUTRITION CONSULT NOTE   Indication: Prolonged ileus  Patient Measurements: Height: 5' 2 (157.5 cm) Weight: 63.8 kg (140 lb 10.5 oz) IBW/kg (Calculated) : 50.1 TPN AdjBW (KG): 61.3 Body mass index is 25.73 kg/m.  Assessment: Presented to ED with N/V/D and abdominal pain x 3d on 05/31/24. CT negative. Possibly from diverticulitis and placed on Cefepime /Flagyl . Concern for SBO, NGT placed. Continues to have persistent pain and distention. No significant intake since 8/21. Pharmacy consulted to start TPN on 8/26. S/p ex lap with small bowel resection on 8/27.    Glucose / Insulin : No h/o DM -CBGs within goal < 150 -No SSI given in last 24 hrs Electrolytes: WNL including coCa (9.5) Renal: Scr and BUN WNL Hepatic: LFTs WNL; alb 3.3; TG stable Intake / Output; MIVF:  -LBM documented 9/18, UOP minimal documentation -Very little PO intake  GI Imaging: 8/19 CT: Scattered colonic diverticulosis. No active diverticulitis.   8/22, 8/23, 8/25, 8/26 Abd Xray: all SBO  8/26 CT: severely thickened and inflamed central pelvic loops of small bowel concerning for ischemia. Concern for mesenteric volvulus or closed loop obstruction.  9/2 CT: 1. Interval abdominal surgery. There is a small amount of free air in the upper abdomen likely related to recent surgery. 2. New enhancing fluid collections in the right paracolic gutter and central pelvis worrisome for abscesses. 3. Persistent clustered small bowel loops in the pelvis with wall thickening and mesenteric edema. There is a small amount of air within the mesentery at this level which is new from prior. 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 4. Dilated small bowel loops in the upper abdomen worrisome for small bowel obstruction. 5. Diffuse bladder wall thickening with small amount of air in the bladder. Findings may be related to recent instrumentation or infection. 6. Small amount of simple free fluid in the right upper  quadrant and left upper quadrant. 7. Body wall edema. 8. Aortic atherosclerosis.  GI Surgeries / Procedures:  8/27 Ex lap with small bowel resection 9/3 IR drain placement to abd/pelvic fluid collection  Central access: PICC TPN start date: 8/26  Nutritional Goals: Goal TPN rate is 80 mL/hr (provides 86 g of protein and  1759 kcals per day)  RD Assessment: Kcal 1650-1850 Protein 75-90 g Fluid 1.8 L/day  Current Nutrition:  Ensure Plus High Protein BID Magic cup TIDAC  Patient initially with minimal PO intake which had been improving with assistance from family and staff. TPN was weaned to half rate in anticipation of continued PO intake. Unfortunately, intake has decreased. Intake has remained low at ~ 20-25% or less of some meals.   Palliative care meeting with family done 9/18. Per TRH, family considering PEG if oral intake remains poor. Very little oral intake documented. SLP eval pending for swallowing function and recommendations on food textures. Following this, may need repeat calorie count to assess how much of daily needs patient is meeting.  Addendum 1045: Per MD, patient ate some breakfast and swallowing function ok. Decrease TPN to half rate.  Plan:  -At 1800, decrease TPN to 40 mL/hr -Electrolytes in TPN:  Na 50 mEq/L K 40 mEq/L Ca 5 mEq/L Mg 2 mEq/L Phos 10 mmol/L Cl:Ac 1:1 -Add standard MVI and trace elements to TPN -Continue Sensitive q6h SSI -Monitor TPN labs on Mon/Thurs -Continue to follow for plan of care   Stefano MARLA Bologna, PharmD, BCPS Clinical Pharmacist 07/02/2024 9:47 AM

## 2024-07-03 DIAGNOSIS — Z515 Encounter for palliative care: Secondary | ICD-10-CM | POA: Diagnosis not present

## 2024-07-03 DIAGNOSIS — Z7189 Other specified counseling: Secondary | ICD-10-CM | POA: Diagnosis not present

## 2024-07-03 DIAGNOSIS — R531 Weakness: Secondary | ICD-10-CM | POA: Diagnosis not present

## 2024-07-03 DIAGNOSIS — K5700 Diverticulitis of small intestine with perforation and abscess without bleeding: Secondary | ICD-10-CM | POA: Diagnosis not present

## 2024-07-03 LAB — GLUCOSE, CAPILLARY
Glucose-Capillary: 92 mg/dL (ref 70–99)
Glucose-Capillary: 95 mg/dL (ref 70–99)
Glucose-Capillary: 99 mg/dL (ref 70–99)
Glucose-Capillary: 93 mg/dL (ref 70–99)

## 2024-07-03 MED ORDER — TRAVASOL 10 % IV SOLN
INTRAVENOUS | Status: AC
  Filled 2024-07-03: qty 432

## 2024-07-03 NOTE — Progress Notes (Signed)
 Mobility Specialist - Progress Note   07/03/24 1140  Mobility  Activity Ambulated with assistance  Level of Assistance Minimal assist, patient does 75% or more  Assistive Device Other (Comment) (HHA)  Distance Ambulated (ft) 5 ft  Range of Motion/Exercises Active  Activity Response Tolerated fair  Mobility Referral Yes  Mobility visit 1 Mobility  Mobility Specialist Start Time (ACUTE ONLY) 1120  Mobility Specialist Stop Time (ACUTE ONLY) 1140  Mobility Specialist Time Calculation (min) (ACUTE ONLY) 20 min   Pt was found in bed and agreeable to mobilize. C/o R sided pain which limited session. Requires safety cues. At EOS was left on recliner chair. Chair alarm on and call bell in reach. RN in room and notified pt c/o pain.   Erminio Leos,  Mobility Specialist Can be reached via Secure Chat

## 2024-07-03 NOTE — Progress Notes (Signed)
 Progress Note   Patient: Lindsay Mason FMW:[id number] DOB: 01/05/1951 DOA: 06/02/2024     31 DOS: the patient was seen and examined on 07/03/2024     Hospital Course: From HPI Ms. 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     Patient presented to the hospital with positive blood culture with staph species.  Patient had lethargy and worsening mental status.  In the ED patient had slightly low blood pressure.  White blood cell elevated at 13.  CT abdomen and pelvis done on 06/01/2024 revealed no acute finding.  Patient was admitted to the hospital for further evaluation and treatment.  During hospitalization, patient continued to have abdominal pain and distention.  General surgery was consulted.   Repeat CT scanning of the abdomen and pelvis with contrast done on 06/08/2024 revealed severely thickened and inflamed central pelvic loops of small bowel concerning for ischemia. Complex appearance due to indistinct bowel wall margins, surrounding edema, dilated small bowel extending towards these loops, and at least some sense of twisting of bowel along adjacent branches of the superior mesenteric vein concerning for mesenteric volvulus or closed loop obstruction.   Patient eventually underwent exploratory laparotomy with a small bowel resection 06/09/2024.  Patient is currently on TPN, with very poor oral intake.  Repeat CT scan 06/15/2024 revealed new enhancing fluid collections in the right paracolic gutter and central pelvis worrisome for abscesses. IR consulted for drain placement. Underwent drain placement on 9/03.     Assessment & Plan:   Sepsis - resolved  Concern for SB ischemia vs SBO vs volvulus with possible microperforation -Patient initially had  leukocytosis with hypotension and lethargy - Followed by surgery and taken to the OR on 06/09/2024 for diagnostic laparoscopy, exploratory laparotomy with small bowel resection --Treated initially with  cefepime  and metronidazole  14 days.  Blood cultures negative. - repeat CT 9/7 showing new fluid collection RLQ 4.1 x 2.6 cm and persistent central pelvic collection measuring 3.6 x 3.5 cm - Remains afebrile with resolved leukocytosis off antibiotics Currently on TPN Palliative care on board and discussing goals of care. Palliative care had a family discussion on 07/01/2024 CODE STATUS have been changed to DNR. Also family have agreed for TPN to be weaned off in the next few days. Family opted to trial oral intake as patient tolerates, versus placement of G-tube for artificial feeding. Speech therapist following closely   Metabolic Encephalopathy Alzheimer's dementia - Presumed worsening in setting of acute infection; son states encephalopathy worsens when she is sick -Continue delirium precautions - trial of seroquel ; avoiding over sedating meds as able unless necessary - PRN haldol     Hypophosphatemia - repleted   Hypokalemia - Repleted   Blood culture contamination - 06/01/2024 culture noted with Staph hominis, presumed contaminant.  No further workup   COPD with emphysema  Continue albuterol  inhaler   Hyperlipidemia PAD (peripheral artery disease)  Thoracic aortic atherosclerosis -Holding Lipitor   History of essential hypertension On as needed IV hydralazine    Weakness, deconditioning.   - Seen by PT OT and recommended skilled nursing facility placement    Dysphagia  - s/p SLP eval - continue regular diet       Antimicrobials: Meropenem .  Discontinued 06/23/2024   DVT prophylaxis:  enoxaparin  (LOVENOX ) injection 40 mg Start: 06/16/24 2100 SCDs Start: 06/02/24 1114  Code Status:   Code Status: DNR     Disposition Plan: Family prefers SNF for short-term  rehabilitation post discharge. TOC consult placed   HH orders placed: N/A Status is: Inpatient     Physical Exam Constitutional:      Comments: Pleasant elderly woman lying in bed pleasantly confused  HENT:     Head: Normocephalic and atraumatic.     Mouth/Throat:     Mouth: Mucous membranes are moist.  Eyes:     Extraocular Movements: Extraocular movements intact.  Cardiovascular:     Rate and Rhythm: Normal rate and regular rhythm.  Pulmonary:     Effort: Pulmonary effort is normal. No respiratory distress.     Breath sounds: Normal breath sounds. No wheezing.  Abdominal:     General: Bowel sounds are normal. There is no distension.     Palpations: Abdomen is soft.     Tenderness: There is no abdominal tenderness.     Comments: Midline abdominal incision noted with surgical dressing in place.  Wound is open with secondary intention  Musculoskeletal:        General: Normal range of motion.     Cervical back: Normal range of motion and neck supple.  Skin:    General: Skin is warm and dry.  Neurological:     Mental Status: Mental status is at baseline. She is disoriented.      Consultants:  General surgery Palliative care    Procedures:  06/09/2024: Diagnostic laparoscopy, exploratory laparotomy with small bowel resection   Subjective: Denies any acute overnight events   Data Reviewed:      Latest Ref Rng & Units 07/01/2024    2:42 AM 06/28/2024    2:15 AM 06/27/2024    3:12 AM  CBC  WBC 4.0 - 10.5 K/uL 5.3  7.3  10.0   Hemoglobin 12.0 - 15.0 g/dL 8.5  8.7  8.9   Hematocrit 36.0 - 46.0 % 28.1  28.2  28.0   Platelets 150 - 400 K/uL 372  432  476        Latest Ref Rng & Units 07/01/2024    2:42 AM 06/28/2024    2:15 AM 06/26/2024    3:37 AM  BMP  Glucose 70 - 99 mg/dL 93  897  892   BUN 8 - 23 mg/dL 25  25  24    Creatinine 0.44 - 1.00 mg/dL 9.51  9.57  9.56   Sodium 135 - 145 mmol/L 141  141  140   Potassium 3.5 - 5.1 mmol/L 4.0  4.1  4.1   Chloride 98 - 111  mmol/L 109  109  105   CO2 22 - 32 mmol/L 21  22  23    Calcium  8.9 - 10.3 mg/dL 9.0  9.1  9.2     Vitals:   07/02/24 2026 07/02/24 2029 07/03/24 0503 07/03/24 1202  BP: 106/67 106/67 112/68 97/70  Pulse: 92 87 86 95  Resp: 18 18 18 18   Temp: 98.3 F (36.8 C) 98.3 F (36.8 C) 99.1 F (37.3 C) 98 F (36.7 C)  TempSrc:  Oral Oral Oral  SpO2: 98% 97% 98% 99%  Weight:   66.7 kg   Height:         Author: Drue ONEIDA Potter, MD 07/03/2024 1:41 PM  For on call review [email or web address].

## 2024-07-03 NOTE — Progress Notes (Signed)
 Daily Progress Note   Patient Name: Lindsay Mason       Date: 07/03/2024 DOB: April 04, 1951  Age: 73 y.o. MRN#: [id number] Attending Physician: Dorinda Drue DASEN, MD Primary Care Physician: [name], MD Admit Date: 06/02/2024  Reason for Consultation/Follow-up: Establishing goals of care and Non pain symptom management  Subjective:  Awake alert, sitting up in bed, working on her breakfast tray, responds well to directions from nursing staff.  Earnest efforts being made at increasing patient's oral intake.  SLP notes reviewed pharmacy notes reviewed.  TPN currently at 40 mL/h.  Length of Stay: 31  Current Medications: Scheduled Meds:   Chlorhexidine  Gluconate Cloth  6 each Topical Daily   docusate sodium   100 mg Oral BID   enoxaparin  (LOVENOX ) injection  40 mg Subcutaneous Q24H   feeding supplement  237 mL Oral BID BM   Gerhardt's butt cream   Topical BID   methocarbamol  (ROBAXIN ) injection  500 mg Intravenous Q8H   nortriptyline   25 mg Oral QHS   polyethylene glycol  17 g Oral Daily   QUEtiapine   50 mg Oral QAC supper   rivastigmine   9.5 mg Transdermal Daily   sodium chloride  flush  10-40 mL Intracatheter Q12H    Continuous Infusions:  TPN ADULT (ION) 40 mL/hr at 07/02/24 1830   TPN ADULT (ION)      PRN Meds: acetaminophen  (TYLENOL ) oral liquid 160 mg/5 mL, albuterol , haloperidol  lactate, hydrALAZINE , loperamide , morphine  injection, [DISCONTINUED] ondansetron  **OR** ondansetron  (ZOFRAN ) IV, oxyCODONE , phenol, sodium chloride  flush  Physical Exam         Elderly lady resting in bed No acute distress Regular work of breathing Answers a few questions appropriately  Vital Signs: BP 112/68 (BP Location: Left Arm)   Pulse 86   Temp 99.1 F (37.3 C) (Oral)   Resp 18   Ht  5' 2 (1.575 m)   Wt 66.7 kg   SpO2 98%   BMI 26.90 kg/m  SpO2: SpO2: 98 % O2 Device: O2 Device: Room Air O2 Flow Rate: O2 Flow Rate (L/min): 2 L/min  Intake/output summary:  Intake/Output Summary (Last 24 hours) at 07/03/2024 1112 Last data filed at 07/03/2024 9177 Gross per 24 hour  Intake 320 ml  Output 1 ml  Net 319 ml   LBM: Last BM Date : 07/01/24 Baseline  Weight: Weight: 61.3 kg Most recent weight: Weight: 66.7 kg       Palliative Assessment/Data:      Patient Active Problem List   Diagnosis Date Noted   Goals of care, counseling/discussion 06/28/2024   Malnutrition of moderate degree 06/23/2024   S/P small bowel resection 06/23/2024   Hypophosphatemia 06/22/2024   Hypokalemia 06/22/2024   Perforated of jejunal diverticulitis s/p SB resection 06/09/2024 06/09/2024   Nausea and vomiting 06/02/2024   Hyperlipidemia 06/02/2024   Abnormal EKG 06/02/2024   Transaminitis 05/09/2024   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 03/02/2024   Fall with injury 02/05/2024   Multiple closed fractures of ribs of left side with routine healing 02/05/2024   Urinary retention 02/05/2024   Non-small cell carcinoma of left lung, stage 1 (HCC) 01/20/2024   Pneumothorax 01/10/2024   PAD (peripheral artery disease) (HCC) 12/06/2023   UTI due to extended-spectrum beta lactamase (ESBL) producing Escherichia coli 12/06/2023   Acid reflux 11/06/2023   Sundowning 11/06/2023   Medicare annual wellness visit, subsequent 05/01/2022   Vitamin B12 deficiency 01/22/2021   History of essential hypertension 01/21/2021   Severe early onset Alzheimer dementia (HCC) 01/21/2021   History of primary malignant neoplasm of right lung 01/20/2020   S/P lobectomy of lung 01/20/2020   Advanced care planning/counseling discussion 06/26/2017   Degenerative scoliosis 06/26/2017   COPD with emphysema (HCC) 02/12/2016   Thoracic aortic atherosclerosis (HCC) 02/12/2016   Health maintenance examination  02/01/2016   Weight loss 02/01/2016   Microscopic colitis 06/05/2015   Abnormal MRI of head 10/02/2011   Headache 08/06/2011   Ex-smoker 04/30/2007   Osteopenia 04/30/2007    Palliative Care Assessment & Plan   Patient Profile:    Assessment: 73 year old lady with COPD dyslipidemia 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, history of dementia with behavioral disturbances, history of peripheral arterial disease thoracic and abdominal aortic aneurysm Admitted to hospital medicine service with positive blood cultures with staph species, CT abdomen concerning for small bowel ischemia patient underwent ex lap with small bowel resection on 06-09-2024 Postop course complicated by minimal oral intake and on 9-3 patient underwent drain placement because repeat CT scan showed enhancing fluid collections in the right paracolic gutter worrisome for abscesses Palliative consultation for CODE STATUS, broad goals of care discussions as well as for discussions with regards to artificial nutrition and hydration has been requested. After a time-limited trial of current interventions including calorie count for the past 24-48 hours, a family meeting took place-see below.  Recommendations/Plan: DNR DNI Continue efforts at TPN wean, increased Po intake, appreciate SLP eval and recommendations SNF rehab with palliative on discharge.   PMT to follow, monitor efforts at PT OT and PO intake.   Code Status:    Code Status Orders  (From admission, onward)           Start     Ordered   07/01/24 1256  Do not attempt resuscitation (DNR)- Limited -Do Not Intubate (DNI)  (Code Status)  Continuous       Question Answer Comment  If pulseless and not breathing No CPR or chest compressions.   In Pre-Arrest Conditions (Patient Is Breathing and Has A Pulse) Do not intubate. Provide all appropriate non-invasive medical interventions. Avoid ICU transfer unless indicated or required.    Consent: Discussion documented in EHR or advanced directives reviewed      07/01/24 1255           Code  Status History     Date Active Date Inactive Code Status Order ID Comments User Context   06/02/2024 1114 07/01/2024 1255 Full Code [id number]  Celinda Alm Lot, MD ED   01/09/2024 1020 01/17/2024 1852 Full Code [id number]  Zella Katha HERO, MD ED   01/20/2020 1539 01/22/2020 1707 Full Code [id number]  Lindia Laurel MATSU, PA-C Inpatient   12/31/2011 0156 01/01/2012 1602 Full Code [id number]  Harvey Zebedee RAMAN, RN Inpatient       Prognosis:  < 12 months  Discharge Planning: Skilled Nursing Facility for rehab with Palliative care service follow-up  Care plan was discussed with patient, RN  thank you for allowing the Palliative Medicine Team to assist in the care of this patient.  MOD MDM     Greater than 50%  of this time was spent counseling and coordinating care related to the above assessment and plan.  Lonia Serve, MD  Please contact Palliative Medicine Team phone at [phone] for questions and concerns.

## 2024-07-03 NOTE — Progress Notes (Signed)
 PHARMACY - TOTAL PARENTERAL NUTRITION CONSULT NOTE   Indication: Prolonged ileus  Patient Measurements: Height: 5' 2 (157.5 cm) Weight: 66.7 kg (147 lb 0.8 oz) IBW/kg (Calculated) : 50.1 TPN AdjBW (KG): 61.3 Body mass index is 26.9 kg/m.  Assessment: Presented to ED with N/V/D and abdominal pain x 3d on 05/31/24. CT negative. Possibly from diverticulitis and placed on Cefepime /Flagyl . Concern for SBO, NGT placed. Continues to have persistent pain and distention. No significant intake since 8/21. Pharmacy consulted to start TPN on 8/26. S/p ex lap with small bowel resection on 8/27.    Glucose / Insulin : No h/o DM -CBGs within goal < 150 -No SSI given in last 24 hrs and none required since 9/15 Electrolytes: WNL including coCa (9.5) Renal: Scr and BUN WNL Hepatic: LFTs WNL; alb 3.3; TG stable Intake / Output; MIVF:  -LBM documented 9/18, UOP minimal documentation -Very little PO intake  GI Imaging: 8/19 CT: Scattered colonic diverticulosis. No active diverticulitis.   8/22, 8/23, 8/25, 8/26 Abd Xray: all SBO  8/26 CT: severely thickened and inflamed central pelvic loops of small bowel concerning for ischemia. Concern for mesenteric volvulus or closed loop obstruction.  9/2 CT: 1. Interval abdominal surgery. There is a small amount of free air in the upper abdomen likely related to recent surgery. 2. New enhancing fluid collections in the right paracolic gutter and central pelvis worrisome for abscesses. 3. Persistent clustered small bowel loops in the pelvis with wall thickening and mesenteric edema. There is a small amount of air within the mesentery at this level which is new from prior. 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 4. Dilated small bowel loops in the upper abdomen worrisome for small bowel obstruction. 5. Diffuse bladder wall thickening with small amount of air in the bladder. Findings may be related to recent instrumentation or infection. 6. Small amount of simple free  fluid in the right upper quadrant and left upper quadrant. 7. Body wall edema. 8. Aortic atherosclerosis.  GI Surgeries / Procedures:  8/27 Ex lap with small bowel resection 9/3 IR drain placement to abd/pelvic fluid collection  Central access: PICC TPN start date: 8/26  Nutritional Goals: Goal TPN rate is 80 mL/hr (provides 86 g of protein and  1759 kcals per day)  RD Assessment: Kcal 1650-1850 Protein 75-90 g Fluid 1.8 L/day  Current Nutrition:  Ensure Plus High Protein BID Magic cup TIDAC  Patient initially with minimal PO intake which had been improving with assistance from family and staff. TPN was weaned to half rate in anticipation of continued PO intake. Unfortunately, intake has decreased. Intake has remained low at ~ 20-25% or less of some meals.   Palliative care meeting with family done 9/18. Per TRH, family considering PEG if oral intake remains poor. Very little oral intake documented. SLP eval pending for swallowing function and recommendations on food textures. Following this, may need repeat calorie count to assess how much of daily needs patient is meeting.  9/19 1045: Per MD, patient ate some breakfast and swallowing function ok. Decrease TPN to half rate.  Plan:  -At 1800, continue TPN at 40 mL/hr -Electrolytes in TPN:  Na 50 mEq/L K 40 mEq/L Ca 5 mEq/L Mg 2 mEq/L Phos 10 mmol/L Cl:Ac 1:1 -Add standard MVI and trace elements to TPN -Discontinue Sensitive q6h SSI -Monitor TPN labs on Mon/Thurs -Continue to follow for plan of care   Eleanor Agent, PharmD, BCPS Clinical Pharmacist 07/03/2024 9:59 AM

## 2024-07-03 NOTE — Plan of Care (Signed)
  Problem: Fluid Volume: Goal: Hemodynamic stability will improve Outcome: Progressing   Problem: Clinical Measurements: Goal: Signs and symptoms of infection will decrease Outcome: Progressing   Problem: Activity: Goal: Risk for activity intolerance will decrease Outcome: Progressing   Problem: Nutrition: Goal: Adequate nutrition will be maintained Outcome: Progressing   Problem: Coping: Goal: Level of anxiety will decrease Outcome: Progressing   Problem: Elimination: Goal: Will not experience complications related to bowel motility Outcome: Progressing Goal: Will not experience complications related to urinary retention Outcome: Progressing   Problem: Pain Managment: Goal: General experience of comfort will improve and/or be controlled Outcome: Progressing   Problem: Safety: Goal: Ability to remain free from injury will improve Outcome: Progressing   Problem: Skin Integrity: Goal: Risk for impaired skin integrity will decrease Outcome: Progressing   Problem: Metabolic: Goal: Ability to maintain appropriate glucose levels will improve Outcome: Progressing   Problem: Nutritional: Goal: Maintenance of adequate nutrition will improve Outcome: Progressing

## 2024-07-04 DIAGNOSIS — Z515 Encounter for palliative care: Secondary | ICD-10-CM | POA: Diagnosis not present

## 2024-07-04 DIAGNOSIS — K5700 Diverticulitis of small intestine with perforation and abscess without bleeding: Secondary | ICD-10-CM | POA: Diagnosis not present

## 2024-07-04 DIAGNOSIS — Z7189 Other specified counseling: Secondary | ICD-10-CM | POA: Diagnosis not present

## 2024-07-04 DIAGNOSIS — R531 Weakness: Secondary | ICD-10-CM | POA: Diagnosis not present

## 2024-07-04 LAB — BASIC METABOLIC PANEL WITH GFR
Anion gap: 11 (ref 5–15)
BUN: 26 mg/dL — ABNORMAL HIGH (ref 8–23)
CO2: 20 mmol/L — ABNORMAL LOW (ref 22–32)
Calcium: 8.7 mg/dL — ABNORMAL LOW (ref 8.9–10.3)
Chloride: 110 mmol/L (ref 98–111)
Creatinine, Ser: 0.51 mg/dL (ref 0.44–1.00)
GFR, Estimated: >60 mL/min (ref >60)
Glucose, Bld: 98 mg/dL (ref 70–99)
Potassium: 3.9 mmol/L (ref 3.5–5.1)
Sodium: 140 mmol/L (ref 135–145)

## 2024-07-04 LAB — CBC WITH DIFFERENTIAL/PLATELET
Abs Immature Granulocytes: 0.02 K/uL (ref 0.00–0.07)
Basophils Absolute: 0.1 K/uL (ref 0.0–0.1)
Basophils Relative: 1 %
Eosinophils Absolute: 0.3 K/uL (ref 0.0–0.5)
Eosinophils Relative: 6 %
HCT: 29.3 % — ABNORMAL LOW (ref 36.0–46.0)
Hemoglobin: 9.2 g/dL — ABNORMAL LOW (ref 12.0–15.0)
Immature Granulocytes: 0 %
Lymphocytes Relative: 30 %
Lymphs Abs: 1.4 K/uL (ref 0.7–4.0)
MCH: 29.6 pg (ref 26.0–34.0)
MCHC: 31.4 g/dL (ref 30.0–36.0)
MCV: 94.2 fL (ref 80.0–100.0)
Monocytes Absolute: 0.3 K/uL (ref 0.1–1.0)
Monocytes Relative: 7 %
Neutro Abs: 2.6 K/uL (ref 1.7–7.7)
Neutrophils Relative %: 56 %
Platelets: 295 K/uL (ref 150–400)
RBC: 3.11 MIL/uL — ABNORMAL LOW (ref 3.87–5.11)
RDW: 17.5 % — ABNORMAL HIGH (ref 11.5–15.5)
WBC: 4.6 K/uL (ref 4.0–10.5)
nRBC: 0 % (ref 0.0–0.2)

## 2024-07-04 LAB — GLUCOSE, CAPILLARY: Glucose-Capillary: 95 mg/dL (ref 70–99)

## 2024-07-04 MED ORDER — TRAVASOL 10 % IV SOLN
INTRAVENOUS | Status: AC
  Filled 2024-07-04: qty 432

## 2024-07-04 NOTE — Progress Notes (Signed)
 PROGRESS NOTE    Lindsay Mason  FMW:[id number] DOB: Aug 02, 1951 DOA: 06/02/2024 PCP: [name], MD   Brief Narrative:  72 year old female with past medical history significant for COPD, hyperlipidemia, history of right lower lobe adenocarcinoma treated with lobectomy in 2022, peripheral artery disease, thoracic and abdominal aortic aneurysm, and dementia with behavioral disturbance presented with positive blood culture with staph species with lethargy/worsening mental status.  General surgery consulted for abdominal pain and distention.  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  She eventually underwent exploratory laparotomy with small bowel resection on 06/09/2024.  Subsequently, patient required TPN with very poor oral intake.  Repeat CT scan on 06/15/2024 revealed findings suggestive for abscesses.  She underwent IR drain placement on 06/16/2024.  Palliative care following for goals of care and because of overall very poor prognosis.  Assessment & Plan:   Sepsis: Present on admission; resolved Concern for small bowel obstruction versus small bowel ischemia versus volvulus with possible microperforation Intra-abdominal abscesses - Underwent diagnostic laparoscopy, exploratory laparotomy with small bowel resection by general surgery on 06/10/2023.  Treated with cefepime  and Flagyl  for 14 days.   - Subsequently, patient required TPN with very poor oral intake.  Repeat CT scan on 06/15/2024 revealed findings suggestive for abscesses.  She underwent IR drain placement on 06/16/2024.  - Repeat CT on 06/20/2024 showed new right lower quadrant collection and persistent central pelvic collection: Drain removed by IR on 06/22/2024. -Currently on TPN. - Palliative care following.  Family has opted to wean off TPN over the next few days and to encourage patient to increase oral intake.  Not a good candidate for PEG tube placement.   SLP following.  Dietitian following - Patient will need palliative care follow-up at discharge  Acute metabolic encephalopathy/delirium Alzheimer's dementia - Continue delirium precautions.  Currently getting trial of Seroquel .  Avoid other sedating medications as able unless necessary.  Continue as needed Haldol   Anemia of chronic disease - From chronic illnesses.  Monitor intermittently  Acute metabolic acidosis - Mild.  Monitor intermittently  Staph hominis bacteremia - 06/01/2024 blood culture positive for Staph hominis: Presumed contaminant.  COPD with emphysema - Stable.  Continue as needed albuterol   Hyperlipidemia PAD Thoracic aortic atherosclerosis - Lipitor on hold.  Essential hypertension -Blood pressure stable.  Continue as needed IV hydralazine   Physical deconditioning - PT/OT recommending SNF placement.  TOC following  Dysphagia - Diet as per SLP recommendations  DVT prophylaxis: Lovenox  Code Status: DNR Family Communication: Daughter and granddaughter at bedside Disposition Plan: Status is: Inpatient Remains inpatient appropriate because: Of severity of illness.  Need for SNF placement.  Still on TPN    Consultants: General surgery/IR/palliative care  Procedures: As above  Antimicrobials:  None currently  Subjective: Patient seen and examined at bedside.  Per external.  No seizures, fever, vomiting reported.  Objective: Vitals:   07/03/24 0503 07/03/24 1202 07/03/24 2106 07/04/24 0434  BP: 112/68 97/70 (!) 145/73 118/77  Pulse: 86 95 91 85  Resp: 18 18 18 18   Temp: 99.1 F (37.3 C) 98 F (36.7 C) 99.4 F (37.4 C) 98.8 F (37.1 C)  TempSrc: Oral Oral Oral Oral  SpO2: 98% 99% 99% 98%  Weight: 66.7 kg     Height:        Intake/Output Summary (Last 24 hours) at 07/04/2024 0827 Last data filed at 07/03/2024 1714 Gross per 24 hour  Intake 300 ml  Output --  Net 300 ml   Filed Weights   07/01/24 0500 07/02/24 0556 07/03/24 0503   Weight: 65.3 kg 63.8 kg 66.7 kg    Examination:  General exam: Appears calm and comfortable.  Looks chronically ill and deconditioned. Respiratory system: Bilateral decreased breath sounds at bases with scattered crackles Cardiovascular system: S1 & S2 heard, Rate controlled Gastrointestinal system: Abdomen is distended, soft and mildly tender with midline abdominal incision with surgical dressing present.  Normal bowel sounds heard. Extremities: No cyanosis, clubbing, edema  Central nervous system: Awake, slow to respond.  No focal neurological deficits. Moving extremities Skin: No rashes, lesions or ulcers Psychiatry: Flat affect.  Not agitated.    Data Reviewed: I have personally reviewed following labs and imaging studies  CBC: Recent Labs  Lab 06/28/24 0215 07/01/24 0242 07/04/24 0321  WBC 7.3 5.3 4.6  NEUTROABS 4.6 2.9 2.6  HGB 8.7* 8.5* 9.2*  HCT 28.2* 28.1* 29.3*  MCV 91.6 92.1 94.2  PLT 432* 372 295   Basic Metabolic Panel: Recent Labs  Lab 06/28/24 0215 07/01/24 0242 07/04/24 0321  NA 141 141 140  K 4.1 4.0 3.9  CL 109 109 110  CO2 22 21* 20*  GLUCOSE 102* 93 98  BUN 25* 25* 26*  CREATININE 0.42* 0.48 0.51  CALCIUM  9.1 9.0 8.7*  MG 2.1 2.1  --   PHOS 3.7 3.3  --    GFR: Estimated Creatinine Clearance: 56.1 mL/min (by C-G formula based on SCr of 0.51 mg/dL). Liver Function Tests: Recent Labs  Lab 06/28/24 0215 07/01/24 0242  AST 38 20  ALT 57* 29  ALKPHOS 115 109  BILITOT 0.4 0.3  PROT 5.7* 6.0*  ALBUMIN  3.1* 3.3*   No results for input(s): LIPASE, AMYLASE in the last 168 hours. No results for input(s): AMMONIA in the last 168 hours. Coagulation Profile: No results for input(s): INR, PROTIME in the last 168 hours. Cardiac Enzymes: No results for input(s): CKTOTAL, CKMB, CKMBINDEX, TROPONINI in the last 168 hours. BNP (last 3 results) No results for input(s): PROBNP in the last 8760 hours. HbA1C: No results for  input(s): HGBA1C in the last 72 hours. CBG: Recent Labs  Lab 07/03/24 0210 07/03/24 0620 07/03/24 1146 07/03/24 1728 07/04/24 0029  GLUCAP 92 95 99 93 95   Lipid Profile: No results for input(s): CHOL, HDL, LDLCALC, TRIG, CHOLHDL, LDLDIRECT in the last 72 hours. Thyroid  Function Tests: No results for input(s): TSH, T4TOTAL, FREET4, T3FREE, THYROIDAB in the last 72 hours. Anemia Panel: No results for input(s): VITAMINB12, FOLATE, FERRITIN, TIBC, IRON, RETICCTPCT in the last 72 hours. Sepsis Labs: No results for input(s): PROCALCITON, LATICACIDVEN in the last 168 hours.  Recent Results (from the past 240 hours)  SARS Coronavirus 2 by RT PCR (hospital order, performed in [facility] hospital lab) *cepheid single result test* Anterior Nasal Swab     Status: None   Collection Time: 06/29/24  4:29 PM   Specimen: Anterior Nasal Swab  Result Value Ref Range Status   SARS Coronavirus 2 by RT PCR NEGATIVE NEGATIVE Final    Comment: (NOTE) SARS-CoV-2 target nucleic acids are NOT DETECTED.  The SARS-CoV-2 RNA is generally detectable in upper and lower respiratory specimens during the acute phase of infection. The lowest concentration of SARS-CoV-2 viral copies this assay can detect is 250 copies / mL. A negative result does not preclude SARS-CoV-2 infection and should not be used as the sole basis for treatment or other patient management decisions.  A negative result may occur  with improper specimen collection / handling, submission of specimen other than nasopharyngeal swab, presence of viral mutation(s) within the areas targeted by this assay, and inadequate number of viral copies (<250 copies / mL). A negative result must be combined with clinical observations, patient history, and epidemiological information.  Fact Sheet for Patients:   RoadLapTop.co.za  Fact Sheet for Healthcare  Providers: http://kim-miller.com/  This test is not yet approved or  cleared by the [place]  FDA and has been authorized for detection and/or diagnosis of SARS-CoV-2 by FDA under an Emergency Use Authorization (EUA).  This EUA will remain in effect (meaning this test can be used) for the duration of the COVID-19 declaration under Section 564(b)(1) of the Act, 21 U.S.C. section 360bbb-3(b)(1), unless the authorization is terminated or revoked sooner.  Performed at [facility], [address] [place], [place] [address]          Radiology Studies: No results found.      Scheduled Meds:  Chlorhexidine  Gluconate Cloth  6 each Topical Daily   docusate sodium   100 mg Oral BID   enoxaparin  (LOVENOX ) injection  40 mg Subcutaneous Q24H   feeding supplement  237 mL Oral BID BM   Gerhardt's butt cream   Topical BID   methocarbamol  (ROBAXIN ) injection  500 mg Intravenous Q8H   nortriptyline   25 mg Oral QHS   polyethylene glycol  17 g Oral Daily   QUEtiapine   50 mg Oral QAC supper   rivastigmine   9.5 mg Transdermal Daily   sodium chloride  flush  10-40 mL Intracatheter Q12H   Continuous Infusions:  TPN ADULT (ION) 40 mL/hr at 07/03/24 1714   TPN ADULT (ION)            Sophie Mao, MD [facility] 07/04/2024, 8:27 AM

## 2024-07-04 NOTE — Progress Notes (Signed)
 Daily Progress Note   Patient Name: Lindsay Mason       Date: 07/04/2024 DOB: Jun 16, 1951  Age: 73 y.o. MRN#: [id number] Attending Physician: Cheryle Page, MD Primary Care Physician: [name], MD Admit Date: 06/02/2024  Reason for Consultation/Follow-up: Establishing goals of care and Non pain symptom management  Subjective:  Awake alert, sitting up in bed, recognizes family in the room, son husband daughter all at bedside She responds well to directions from family  Freeman efforts being made at increasing patient's oral intake.  SLP notes reviewed pharmacy notes reviewed.  TPN currently at 40 mL/h.  Length of Stay: 32  Current Medications: Scheduled Meds:   Chlorhexidine  Gluconate Cloth  6 each Topical Daily   docusate sodium   100 mg Oral BID   enoxaparin  (LOVENOX ) injection  40 mg Subcutaneous Q24H   feeding supplement  237 mL Oral BID BM   Gerhardt's butt cream   Topical BID   methocarbamol  (ROBAXIN ) injection  500 mg Intravenous Q8H   nortriptyline   25 mg Oral QHS   polyethylene glycol  17 g Oral Daily   QUEtiapine   50 mg Oral QAC supper   rivastigmine   9.5 mg Transdermal Daily   sodium chloride  flush  10-40 mL Intracatheter Q12H    Continuous Infusions:  TPN ADULT (ION) 40 mL/hr at 07/03/24 1714   TPN ADULT (ION)      PRN Meds: acetaminophen  (TYLENOL ) oral liquid 160 mg/5 mL, albuterol , haloperidol  lactate, hydrALAZINE , loperamide , morphine  injection, [DISCONTINUED] ondansetron  **OR** ondansetron  (ZOFRAN ) IV, oxyCODONE , phenol, sodium chloride  flush  Physical Exam         Elderly lady resting in bed No acute distress Regular work of breathing Answers a few questions appropriately  Vital Signs: BP 106/67 (BP Location: Left Arm)   Pulse 97   Temp 98 F  (36.7 C) (Oral)   Resp 19   Ht 5' 2 (1.575 m)   Wt 66.7 kg   SpO2 (!) 80%   BMI 26.90 kg/m  SpO2: SpO2: (!) 80 % O2 Device: O2 Device: Room Air O2 Flow Rate: O2 Flow Rate (L/min): 2 L/min  Intake/output summary:  Intake/Output Summary (Last 24 hours) at 07/04/2024 1257 Last data filed at 07/04/2024 0947 Gross per 24 hour  Intake 318 ml  Output --  Net 318 ml   LBM:  Last BM Date : 07/01/24 Baseline Weight: Weight: 61.3 kg Most recent weight: Weight: 66.7 kg       Palliative Assessment/Data:      Patient Active Problem List   Diagnosis Date Noted   Goals of care, counseling/discussion 06/28/2024   Malnutrition of moderate degree 06/23/2024   S/P small bowel resection 06/23/2024   Hypophosphatemia 06/22/2024   Hypokalemia 06/22/2024   Perforated of jejunal diverticulitis s/p SB resection 06/09/2024 06/09/2024   Nausea and vomiting 06/02/2024   Hyperlipidemia 06/02/2024   Abnormal EKG 06/02/2024   Transaminitis 05/09/2024   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 03/02/2024   Fall with injury 02/05/2024   Multiple closed fractures of ribs of left side with routine healing 02/05/2024   Urinary retention 02/05/2024   Non-small cell carcinoma of left lung, stage 1 (HCC) 01/20/2024   Pneumothorax 01/10/2024   PAD (peripheral artery disease) (HCC) 12/06/2023   UTI due to extended-spectrum beta lactamase (ESBL) producing Escherichia coli 12/06/2023   Acid reflux 11/06/2023   Sundowning 11/06/2023   Medicare annual wellness visit, subsequent 05/01/2022   Vitamin B12 deficiency 01/22/2021   History of essential hypertension 01/21/2021   Severe early onset Alzheimer dementia (HCC) 01/21/2021   History of primary malignant neoplasm of right lung 01/20/2020   S/P lobectomy of lung 01/20/2020   Advanced care planning/counseling discussion 06/26/2017   Degenerative scoliosis 06/26/2017   COPD with emphysema (HCC) 02/12/2016   Thoracic aortic atherosclerosis (HCC)  02/12/2016   Health maintenance examination 02/01/2016   Weight loss 02/01/2016   Microscopic colitis 06/05/2015   Abnormal MRI of head 10/02/2011   Headache 08/06/2011   Ex-smoker 04/30/2007   Osteopenia 04/30/2007    Palliative Care Assessment & Plan   Patient Profile:    Assessment: 73 year old lady with COPD dyslipidemia 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, history of dementia with behavioral disturbances, history of peripheral arterial disease thoracic and abdominal aortic aneurysm Admitted to hospital medicine service with positive blood cultures with staph species, CT abdomen concerning for small bowel ischemia patient underwent ex lap with small bowel resection on 06-09-2024 Postop course complicated by minimal oral intake and on 9-3 patient underwent drain placement because repeat CT scan showed enhancing fluid collections in the right paracolic gutter worrisome for abscesses Palliative consultation for CODE STATUS, broad goals of care discussions as well as for discussions with regards to artificial nutrition and hydration has been requested. After a time-limited trial of current interventions including calorie count for the past 24-48 hours, a family meeting took place-see below.  Recommendations/Plan: DNR DNI Continue efforts at TPN wean: discussed with family about possible TPN wean from 40 to 20 per hour on 07-05-24 and they are in agreement, continue to monitor for increased Po intake, appreciate SLP eval and recommendations SNF rehab with palliative on discharge.   PMT to follow, monitor efforts at PT OT and PO intake.   Code Status:    Code Status Orders  (From admission, onward)           Start     Ordered   07/01/24 1256  Do not attempt resuscitation (DNR)- Limited -Do Not Intubate (DNI)  (Code Status)  Continuous       Question Answer Comment  If pulseless and not breathing No CPR or chest compressions.   In Pre-Arrest  Conditions (Patient Is Breathing and Has A Pulse) Do not intubate. Provide all appropriate non-invasive medical interventions. Avoid ICU transfer unless indicated or  required.   Consent: Discussion documented in EHR or advanced directives reviewed      07/01/24 1255           Code Status History     Date Active Date Inactive Code Status Order ID Comments User Context   06/02/2024 1114 07/01/2024 1255 Full Code [id number]  Celinda Alm Lot, MD ED   01/09/2024 1020 01/17/2024 1852 Full Code [id number]  Zella Katha HERO, MD ED   01/20/2020 1539 01/22/2020 1707 Full Code [id number]  Lindia Laurel MATSU, PA-C Inpatient   12/31/2011 0156 01/01/2012 1602 Full Code [id number]  Harvey Zebedee RAMAN, RN Inpatient       Prognosis:  < 12 months  Discharge Planning: Skilled Nursing Facility for rehab with Palliative care service follow-up  Care plan was discussed with patient, husband, son, daughter.  thank you for allowing the Palliative Medicine Team to assist in the care of this patient.  High MDM     Greater than 50%  of this time was spent counseling and coordinating care related to the above assessment and plan.  Lonia Serve, MD  Please contact Palliative Medicine Team phone at [phone] for questions and concerns.

## 2024-07-04 NOTE — Plan of Care (Signed)

## 2024-07-04 NOTE — Progress Notes (Signed)
 PHARMACY - TOTAL PARENTERAL NUTRITION CONSULT NOTE   Indication: Prolonged ileus  Patient Measurements: Height: 5' 2 (157.5 cm) Weight: 66.7 kg (147 lb 0.8 oz) IBW/kg (Calculated) : 50.1 TPN AdjBW (KG): 61.3 Body mass index is 26.9 kg/m.  Assessment: Presented to ED with N/V/D and abdominal pain x 3d on 05/31/24. CT negative. Possibly from diverticulitis and placed on Cefepime /Flagyl . Concern for SBO, NGT placed. Continues to have persistent pain and distention. No significant intake since 8/21. Pharmacy consulted to start TPN on 8/26. S/p ex lap with small bowel resection on 8/27.    Glucose / Insulin : No h/o DM -CBGs within goal < 150 -No SSI given in last 24 hrs and none required since 9/15 Electrolytes: WNL including coCa (9.5) Renal: Scr and BUN WNL Hepatic: LFTs WNL; alb 3.3; TG stable Intake / Output; MIVF:  -LBM documented 9/18, UOP minimal documentation -25 % meals eaten yesterday  GI Imaging: 8/19 CT: Scattered colonic diverticulosis. No active diverticulitis.   8/22, 8/23, 8/25, 8/26 Abd Xray: all SBO  8/26 CT: severely thickened and inflamed central pelvic loops of small bowel concerning for ischemia. Concern for mesenteric volvulus or closed loop obstruction.  9/2 CT: 1. Interval abdominal surgery. There is a small amount of free air in the upper abdomen likely related to recent surgery. 2. New enhancing fluid collections in the right paracolic gutter and central pelvis worrisome for abscesses. 3. Persistent clustered small bowel loops in the pelvis with wall thickening and mesenteric edema. There is a small amount of air within the mesentery at this level which is new from prior. 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 4. Dilated small bowel loops in the upper abdomen worrisome for small bowel obstruction. 5. Diffuse bladder wall thickening with small amount of air in the bladder. Findings may be related to recent instrumentation or infection. 6. Small amount of simple  free fluid in the right upper quadrant and left upper quadrant. 7. Body wall edema. 8. Aortic atherosclerosis.  GI Surgeries / Procedures:  8/27 Ex lap with small bowel resection 9/3 IR drain placement to abd/pelvic fluid collection  Central access: PICC TPN start date: 8/26  Nutritional Goals: Goal TPN rate is 80 mL/hr (provides 86 g of protein and  1759 kcals per day)  RD Assessment: Kcal 1650-1850 Protein 75-90 g Fluid 1.8 L/day  Current Nutrition:  Ensure Plus High Protein BID Magic cup TIDAC  Patient initially with minimal PO intake which had been improving with assistance from family and staff. TPN was weaned to half rate in anticipation of continued PO intake. Unfortunately, intake has decreased. Intake has remained low at ~ 20-25% or less of some meals.   Palliative care meeting with family done 9/18. Per TRH, family considering PEG if oral intake remains poor. Very little oral intake documented. SLP eval pending for swallowing function and recommendations on food textures. Following this, may need repeat calorie count to assess how much of daily needs patient is meeting.  9/19 1045: Per MD, patient ate some breakfast and swallowing function ok. Decrease TPN to half rate.  Plan:  -At 1800, continue TPN at 40 mL/hr -Electrolytes in TPN:  Na 50 mEq/L K 40 mEq/L Ca 5 mEq/L Mg 2 mEq/L Phos 10 mmol/L Cl:Ac 1:1 -Add standard MVI and trace elements to TPN -Sensitive q6h SSI discontinued on 9/20 -Monitor TPN labs on Mon/Thurs -Continue to follow for plan of care   Eleanor Agent, PharmD, BCPS Clinical Pharmacist 07/04/2024 8:18 AM

## 2024-07-05 DIAGNOSIS — Z515 Encounter for palliative care: Secondary | ICD-10-CM | POA: Diagnosis not present

## 2024-07-05 DIAGNOSIS — R531 Weakness: Secondary | ICD-10-CM | POA: Diagnosis not present

## 2024-07-05 DIAGNOSIS — Z7189 Other specified counseling: Secondary | ICD-10-CM | POA: Diagnosis not present

## 2024-07-05 DIAGNOSIS — K5700 Diverticulitis of small intestine with perforation and abscess without bleeding: Secondary | ICD-10-CM | POA: Diagnosis not present

## 2024-07-05 LAB — CBC WITH DIFFERENTIAL/PLATELET
Abs Immature Granulocytes: 0.01 K/uL (ref 0.00–0.07)
Basophils Absolute: 0.1 K/uL (ref 0.0–0.1)
Basophils Relative: 1 %
Eosinophils Absolute: 0.2 K/uL (ref 0.0–0.5)
Eosinophils Relative: 4 %
HCT: 30.2 % — ABNORMAL LOW (ref 36.0–46.0)
Hemoglobin: 9.1 g/dL — ABNORMAL LOW (ref 12.0–15.0)
Immature Granulocytes: 0 %
Lymphocytes Relative: 27 %
Lymphs Abs: 1.6 K/uL (ref 0.7–4.0)
MCH: 28.3 pg (ref 26.0–34.0)
MCHC: 30.1 g/dL (ref 30.0–36.0)
MCV: 93.8 fL (ref 80.0–100.0)
Monocytes Absolute: 0.4 K/uL (ref 0.1–1.0)
Monocytes Relative: 7 %
Neutro Abs: 3.6 K/uL (ref 1.7–7.7)
Neutrophils Relative %: 61 %
Platelets: 303 K/uL (ref 150–400)
RBC: 3.22 MIL/uL — ABNORMAL LOW (ref 3.87–5.11)
RDW: 17.5 % — ABNORMAL HIGH (ref 11.5–15.5)
WBC: 5.8 K/uL (ref 4.0–10.5)
nRBC: 0 % (ref 0.0–0.2)

## 2024-07-05 LAB — COMPREHENSIVE METABOLIC PANEL WITH GFR
ALT: 24 U/L (ref 0–44)
AST: 21 U/L (ref 15–41)
Albumin: 3.4 g/dL — ABNORMAL LOW (ref 3.5–5.0)
Alkaline Phosphatase: 104 U/L (ref 38–126)
Anion gap: 11 (ref 5–15)
BUN: 20 mg/dL (ref 8–23)
CO2: 20 mmol/L — ABNORMAL LOW (ref 22–32)
Calcium: 8.9 mg/dL (ref 8.9–10.3)
Chloride: 109 mmol/L (ref 98–111)
Creatinine, Ser: 0.51 mg/dL (ref 0.44–1.00)
GFR, Estimated: >60 mL/min (ref >60)
Glucose, Bld: 106 mg/dL — ABNORMAL HIGH (ref 70–99)
Potassium: 3.8 mmol/L (ref 3.5–5.1)
Sodium: 141 mmol/L (ref 135–145)
Total Bilirubin: 0.4 mg/dL (ref 0.0–1.2)
Total Protein: 5.9 g/dL — ABNORMAL LOW (ref 6.5–8.1)

## 2024-07-05 LAB — TRIGLYCERIDES: Triglycerides: 88 mg/dL (ref <150)

## 2024-07-05 LAB — GLUCOSE, CAPILLARY
Glucose-Capillary: 112 mg/dL — ABNORMAL HIGH (ref 70–99)
Glucose-Capillary: 81 mg/dL (ref 70–99)
Glucose-Capillary: 88 mg/dL (ref 70–99)

## 2024-07-05 LAB — RESP PANEL BY RT-PCR (RSV, FLU A&B, COVID)  RVPGX2
Influenza A by PCR: NEGATIVE
Influenza B by PCR: NEGATIVE
Resp Syncytial Virus by PCR: NEGATIVE
SARS Coronavirus 2 by RT PCR: NEGATIVE

## 2024-07-05 LAB — PHOSPHORUS: Phosphorus: 2.9 mg/dL (ref 2.5–4.6)

## 2024-07-05 LAB — MAGNESIUM: Magnesium: 2.2 mg/dL (ref 1.7–2.4)

## 2024-07-05 MED ORDER — TRAVASOL 10 % IV SOLN
INTRAVENOUS | Status: AC
  Filled 2024-07-05: qty 216

## 2024-07-05 MED ORDER — ADULT MULTIVITAMIN W/MINERALS CH
1.0000 | ORAL_TABLET | Freq: Every day | ORAL | Status: DC
  Administered 2024-07-05 – 2024-07-08 (×4): 1 via ORAL
  Filled 2024-07-05 (×4): qty 1

## 2024-07-05 NOTE — Progress Notes (Signed)
 Daily Progress Note   Patient Name: Lindsay Mason       Date: 07/05/2024 DOB: [birth year]  Age: 73 y.o. MRN#: [id number] Attending Physician: Cheryle Page, MD Primary Care Physician: [name], MD Admit Date: 06/02/2024  Reason for Consultation/Follow-up: Establishing goals of care and Non pain symptom management  Subjective:  Awake alert, sitting up in bed,  Ate about 40% breakfast tray with assist Complains about sore throat - isolation precautions as well as resp panel - covid testing has been ordered by primary service.     TPN currently at 40 mL/h.  Length of Stay: 33  Current Medications: Scheduled Meds:   Chlorhexidine  Gluconate Cloth  6 each Topical Daily   docusate sodium   100 mg Oral BID   enoxaparin  (LOVENOX ) injection  40 mg Subcutaneous Q24H   feeding supplement  237 mL Oral BID BM   Gerhardt's butt cream   Topical BID   methocarbamol  (ROBAXIN ) injection  500 mg Intravenous Q8H   nortriptyline   25 mg Oral QHS   polyethylene glycol  17 g Oral Daily   QUEtiapine   50 mg Oral QAC supper   rivastigmine   9.5 mg Transdermal Daily   sodium chloride  flush  10-40 mL Intracatheter Q12H    Continuous Infusions:  TPN ADULT (ION) 40 mL/hr at 07/04/24 1729    PRN Meds: acetaminophen  (TYLENOL ) oral liquid 160 mg/5 mL, albuterol , haloperidol  lactate, hydrALAZINE , loperamide , morphine  injection, [DISCONTINUED] ondansetron  **OR** ondansetron  (ZOFRAN ) IV, oxyCODONE , phenol, sodium chloride  flush  Physical Exam         Elderly lady resting in bed No acute distress Regular work of breathing Answers a few questions appropriately  Vital Signs: BP (!) 141/107 (BP Location: Left Arm)   Pulse 88   Temp 99.4 F (37.4 C) (Oral)   Resp 18   Ht 5' 2 (1.575 m)   Wt 66.7 kg    SpO2 96%   BMI 26.90 kg/m  SpO2: SpO2: 96 % O2 Device: O2 Device: Room Air O2 Flow Rate: O2 Flow Rate (L/min): 2 L/min  Intake/output summary:  Intake/Output Summary (Last 24 hours) at 07/05/2024 1003 Last data filed at 07/04/2024 1800 Gross per 24 hour  Intake 987.65 ml  Output --  Net 987.65 ml   LBM: Last BM Date : 07/01/24 Baseline Weight: Weight: 61.3 kg Most  recent weight: Weight: 66.7 kg       Palliative Assessment/Data:      Patient Active Problem List   Diagnosis Date Noted   Goals of care, counseling/discussion 06/28/2024   Malnutrition of moderate degree 06/23/2024   S/P small bowel resection 06/23/2024   Hypophosphatemia 06/22/2024   Hypokalemia 06/22/2024   Perforated of jejunal diverticulitis s/p SB resection 06/09/2024 06/09/2024   Nausea and vomiting 06/02/2024   Hyperlipidemia 06/02/2024   Abnormal EKG 06/02/2024   Transaminitis 05/09/2024   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 03/02/2024   Fall with injury 02/05/2024   Multiple closed fractures of ribs of left side with routine healing 02/05/2024   Urinary retention 02/05/2024   Non-small cell carcinoma of left lung, stage 1 (HCC) 01/20/2024   Pneumothorax 01/10/2024   PAD (peripheral artery disease) (HCC) 12/06/2023   UTI due to extended-spectrum beta lactamase (ESBL) producing Escherichia coli 12/06/2023   Acid reflux 11/06/2023   Sundowning 11/06/2023   Medicare annual wellness visit, subsequent 05/01/2022   Vitamin B12 deficiency 01/22/2021   History of essential hypertension 01/21/2021   Severe early onset Alzheimer dementia (HCC) 01/21/2021   History of primary malignant neoplasm of right lung 01/20/2020   S/P lobectomy of lung 01/20/2020   Advanced care planning/counseling discussion 06/26/2017   Degenerative scoliosis 06/26/2017   COPD with emphysema (HCC) 02/12/2016   Thoracic aortic atherosclerosis (HCC) 02/12/2016   Health maintenance examination 02/01/2016   Weight loss  02/01/2016   Microscopic colitis 06/05/2015   Abnormal MRI of head 10/02/2011   Headache 08/06/2011   Ex-smoker 04/30/2007   Osteopenia 04/30/2007    Palliative Care Assessment & Plan   Patient Profile:    Assessment: 73 year old lady with COPD dyslipidemia 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, history of dementia with behavioral disturbances, history of peripheral arterial disease thoracic and abdominal aortic aneurysm Admitted to hospital medicine service with positive blood cultures with staph species, CT abdomen concerning for small bowel ischemia patient underwent ex lap with small bowel resection on 06-09-2024 Postop course complicated by minimal oral intake and on 9-3 patient underwent drain placement because repeat CT scan showed enhancing fluid collections in the right paracolic gutter worrisome for abscesses Palliative consultation for CODE STATUS, broad goals of care discussions as well as for discussions with regards to artificial nutrition and hydration has been requested. After a time-limited trial of current interventions including calorie count for the past 24-48 hours, a family meeting took place-see below.  Recommendations/Plan: DNR DNI Resp panel and COVID testing ordered, agree with isolation, agree with throat spray.  TPN currently at 40, monitor PO intake SNF rehab with palliative on discharge.   PMT to follow, monitor efforts at PT OT and PO intake.   Code Status:    Code Status Orders  (From admission, onward)           Start     Ordered   07/01/24 1256  Do not attempt resuscitation (DNR)- Limited -Do Not Intubate (DNI)  (Code Status)  Continuous       Question Answer Comment  If pulseless and not breathing No CPR or chest compressions.   In Pre-Arrest Conditions (Patient Is Breathing and Has A Pulse) Do not intubate. Provide all appropriate non-invasive medical interventions. Avoid ICU transfer unless indicated or  required.   Consent: Discussion documented in EHR or advanced directives reviewed      07/01/24 1255  Code Status History     Date Active Date Inactive Code Status Order ID Comments User Context   06/02/2024 1114 07/01/2024 1255 Full Code [id number]  Celinda Alm Lot, MD ED   01/09/2024 1020 01/17/2024 1852 Full Code [id number]  Zella Katha HERO, MD ED   01/20/2020 1539 01/22/2020 1707 Full Code [id number]  Lindia Laurel MATSU, PA-C Inpatient   12/31/2011 0156 01/01/2012 1602 Full Code [id number]  Harvey Zebedee RAMAN, RN Inpatient       Prognosis:  < 12 months  Discharge Planning: Skilled Nursing Facility for rehab with Palliative care service follow-up  Care plan was discussed with patient TRH MD.  IDT thank you for allowing the Palliative Medicine Team to assist in the care of this patient.  Mod MDM     Greater than 50%  of this time was spent counseling and coordinating care related to the above assessment and plan.  Lonia Serve, MD  Please contact Palliative Medicine Team phone at [phone] for questions and concerns.

## 2024-07-05 NOTE — Progress Notes (Signed)
 PHARMACY - TOTAL PARENTERAL NUTRITION CONSULT NOTE   Indication: Prolonged ileus  Patient Measurements: Height: 5' 2 (157.5 cm) Weight: 66.7 kg (147 lb 0.8 oz) IBW/kg (Calculated) : 50.1 TPN AdjBW (KG): 61.3 Body mass index is 26.9 kg/m.  Assessment: Presented to ED with N/V/D and abdominal pain x 3d on 05/31/24. CT negative. Possibly from diverticulitis and placed on Cefepime /Flagyl . Concern for SBO, NGT placed. Continues to have persistent pain and distention. No significant intake since 8/21. Pharmacy consulted to start TPN on 8/26. S/p ex lap with small bowel resection on 8/27.    Glucose / Insulin : No h/o DM -CBGs within goal < 150 Electrolytes: WNL including coCa Renal: Scr and BUN WNL Hepatic: LFTs WNL; alb 3.4; TG stable Intake / Output; MIVF:  -LBM documented 9/18, UOP minimal documentation -Minimal PO intake documented  GI Imaging: 8/19 CT: Scattered colonic diverticulosis. No active diverticulitis.   8/22, 8/23, 8/25, 8/26 Abd Xray: all SBO  8/26 CT: severely thickened and inflamed central pelvic loops of small bowel concerning for ischemia. Concern for mesenteric volvulus or closed loop obstruction.  9/2 CT: 1. Interval abdominal surgery. There is a small amount of free air in the upper abdomen likely related to recent surgery. 2. New enhancing fluid collections in the right paracolic gutter and central pelvis worrisome for abscesses. 3. Persistent clustered small bowel loops in the pelvis with wall thickening and mesenteric edema. There is a small amount of air within the mesentery at this level which is new from prior. 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 4. Dilated small bowel loops in the upper abdomen worrisome for small bowel obstruction. 5. Diffuse bladder wall thickening with small amount of air in the bladder. Findings may be related to recent instrumentation or infection. 6. Small amount of simple free fluid in the right upper quadrant and left upper  quadrant. 7. Body wall edema. 8. Aortic atherosclerosis.  GI Surgeries / Procedures:  8/27 Ex lap with small bowel resection 9/3 IR drain placement to abd/pelvic fluid collection  Central access: PICC TPN start date: 8/26  Nutritional Goals: Goal TPN rate is 80 mL/hr (provides 86 g of protein and  1759 kcals per day)  RD Assessment: Kcal 1650-1850 Protein 75-90 g Fluid 1.8 L/day  Current Nutrition:  Ensure Plus High Protein BID Magic cup TIDAC  Patient initially with minimal PO intake which had been improving with assistance from family and staff. TPN was weaned to half rate in anticipation of continued PO intake. Unfortunately, intake has decreased. Intake has remained low at ~ 20-25% or less of some meals.   Palliative care meeting with family done 9/18. Per TRH, family considering PEG if oral intake remains poor however it sounds like family has decided against this. Overall patient meets about 50% of needs at best based on previous calorie counts.   Family prefer slow weaning of TPN.  Plan:  -At 1800, decrease TPN to 20 mL/hr -Electrolytes in TPN:  Na 50 mEq/L K 40 mEq/L Ca 5 mEq/L Mg 2 mEq/L Phos 10 mmol/L Cl:Ac 1:1 -Remove multivitamin/trace from TPN and provide oral multivitamin -Monitor TPN labs on Mon/Thurs -Continue to follow for plan of care   Stefano MARLA Bologna, PharmD, BCPS Clinical Pharmacist 07/05/2024 10:46 AM

## 2024-07-05 NOTE — Progress Notes (Signed)
 Physical Therapy Treatment Patient Details Name: Lindsay Mason MRN: [id number] DOB: [birth year] Today's Date: 07/05/2024   History of Present Illness Pt is 73 yo female presented on 06/02/24  with + blood culture with staph species, lethargy, and worsening mental status. Pt admitted with SIRS, abdominal pain, N/V likely secondary to acute diverticulitis, and SBO vs ileus.  Pt with NG tube and surgery following. s/p exploratory lap with SB resection 2* ischemic bowel 06/09/24.  Drain placement for pelvic abscess 06/16/24. Currently on TPN.  Pt also with metabolic encephalopathy on background of Alzheimer's dementia.  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 Pt pleasantly confused. She was able to ambulate ~125 feet with 2 HHA. She tolerated activity well. Patient will benefit from continued inpatient follow up therapy, <3 hours/day     If plan is discharge home, recommend the following: A lot of help with bathing/dressing/bathroom;A lot of help with walking and/or transfers;Assistance with cooking/housework;Help with stairs or ramp for entrance;Assist for transportation   Can travel by private vehicle        Equipment Recommendations  None recommended by PT    Recommendations for Other Services       Precautions / Restrictions Precautions Precautions: Fall Precaution/Restrictions Comments: abdominal sg Other Brace: abdominal binder Restrictions Weight Bearing Restrictions Per Provider Order: No     Mobility  Bed Mobility Overal bed mobility: Needs Assistance Bed Mobility: Supine to Sit, Sit to Supine     Supine to sit: Min assist Sit to supine: Min assist   General bed mobility comments: required increased cues for initiation and  problem solving. assist for LEs on/off bed. Increased time.    Transfers Overall transfer level: Needs assistance   Transfers: Sit to/from Stand Sit to Stand: Min assist           General transfer comment: Min A to initiate, power up, steady.    Ambulation/Gait Ambulation/Gait assistance: Min assist, +2 safety/equipment Gait Distance (Feet): 125 Feet Assistive device: 2 person hand held assist Gait Pattern/deviations: Decreased stride length, Decreased step length - right, Decreased step length - left       General Gait Details: Assist to stabilize pt throughout distance. Cues for encouragement. Requires facilitation to maintain forward momentum   Stairs             Wheelchair Mobility     Tilt Bed    Modified Rankin (Stroke Patients Only)       Balance Overall balance assessment: Needs assistance         Standing balance support: Bilateral upper extremity supported Standing balance-Leahy Scale: Poor                              Communication Communication Communication: Impaired Factors Affecting Communication: Difficulty expressing self  Cognition Arousal: Alert Behavior During Therapy: WFL for tasks assessed/performed   PT - Cognitive impairments: History of cognitive impairments, Orientation, Awareness, Memory, Attention, Initiation, Sequencing, Problem solving, Safety/Judgement   Orientation impairments: Place, Time, Situation                   PT - Cognition Comments: Oriented to self and family only; follows simple commands inconsistenlty -improved with multimodal cues, pt happy and joking today; easily distracted needing  redirecting Following commands: Impaired Following commands impaired: Follows one step commands inconsistently    Cueing Cueing Techniques: Verbal cues, Gestural cues, Tactile cues, Visual cues  Exercises      General Comments        Pertinent Vitals/Pain Pain Assessment Pain Assessment:  Faces Pain Location: abdomen Pain Descriptors / Indicators: Sore, Discomfort Pain Intervention(s): Monitored during session    Home Living                          Prior Function            PT Goals (current goals can now be found in the care plan section) Progress towards PT goals: Progressing toward goals    Frequency    Min 2X/week      PT Plan      Co-evaluation              AM-PAC PT 6 Clicks Mobility   Outcome Measure  Help needed turning from your back to your side while in a flat bed without using bedrails?: A Little Help needed moving from lying on your back to sitting on the side of a flat bed without using bedrails?: A Little Help needed moving to and from a bed to a chair (including a wheelchair)?: A Little Help needed standing up from a chair using your arms (e.g., wheelchair or bedside chair)?: A Little Help needed to walk in hospital room?: A Lot Help needed climbing 3-5 steps with a railing? : Total 6 Click Score: 15    End of Session Equipment Utilized During Treatment: Gait belt Activity Tolerance: Patient tolerated treatment well Patient left: in bed;with call bell/phone within reach;with bed alarm set;with family/visitor present   PT Visit Diagnosis: Difficulty in walking, not elsewhere classified (R26.2);Unsteadiness on feet (R26.81)     Time: 8482-8465 PT Time Calculation (min) (ACUTE ONLY): 17 min  Charges:    $Gait Training: 8-22 mins PT General Charges $$ ACUTE PT VISIT: 1 Visit                        Dannial SQUIBB, PT Acute Rehabilitation  Office: [phone]

## 2024-07-05 NOTE — Plan of Care (Signed)
  Problem: Activity: Goal: Risk for activity intolerance will decrease Outcome: Progressing   Problem: Coping: Goal: Level of anxiety will decrease Outcome: Progressing   Problem: Pain Managment: Goal: General experience of comfort will improve and/or be controlled Outcome: Progressing   Problem: Safety: Goal: Ability to remain free from injury will improve Outcome: Progressing   Problem: Coping: Goal: Ability to adjust to condition or change in health will improve Outcome: Progressing

## 2024-07-05 NOTE — Progress Notes (Signed)
 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  General surgery consulted for abdominal pain and distention.  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  She eventually underwent exploratory laparotomy with small bowel resection on 06/09/2024.  Subsequently, patient required TPN with very poor oral intake.  Repeat CT scan on 06/15/2024 revealed findings suggestive for abscesses.  She underwent IR drain placement on 06/16/2024.  Palliative care following for goals of care and because of overall very poor prognosis.  Assessment & Plan:   Sepsis: Present on admission; resolved Concern for small bowel obstruction versus small bowel ischemia versus volvulus with possible microperforation Intra-abdominal abscesses - Underwent diagnostic laparoscopy, exploratory laparotomy with small bowel resection by general surgery on 06/10/2023.  Treated with cefepime  and Flagyl  for 14 days.   - Subsequently, patient required TPN with very poor oral intake.  Repeat CT scan on 06/15/2024 revealed findings suggestive for abscesses.  She underwent IR drain placement on 06/16/2024.  - Repeat CT on 06/20/2024 showed new right lower quadrant collection and persistent central pelvic collection: Drain removed by IR on 06/22/2024. -General Surgery following intermittently. -Currently on TPN. - Palliative care following.  Family has opted to wean off TPN over the next few days and to encourage patient to increase oral intake.   Not a good candidate for PEG tube placement.  SLP following.  Dietitian following - Patient will need palliative care follow-up at discharge  Acute metabolic encephalopathy/delirium Alzheimer's dementia - Continue delirium precautions.  Currently getting trial of Seroquel .  Avoid other sedating medications as able unless necessary.  Continue as needed Haldol   Anemia of chronic disease - From chronic illnesses.  Monitor intermittently  Acute metabolic acidosis - Mild.  Monitor intermittently  Staph hominis bacteremia - 06/01/2024 blood culture positive for Staph hominis: Presumed contaminant.  COPD with emphysema - Stable.  Continue as needed albuterol   Hyperlipidemia PAD Thoracic aortic atherosclerosis - Lipitor on hold.  Essential hypertension -Blood pressure stable.  Continue as needed IV hydralazine   Physical deconditioning - PT/OT recommending SNF placement.  TOC following  Dysphagia - Diet as per SLP recommendations  DVT prophylaxis: Lovenox  Code Status: DNR Family Communication: Daughter and granddaughter at bedside on 07/04/2024.  Caregiver at bedside today. Disposition Plan: Status is: Inpatient Remains inpatient appropriate because: Of severity of illness.  Need for SNF placement.  Still on TPN    Consultants: General surgery/IR/palliative care  Procedures: As above  Antimicrobials:  None currently  Subjective: Patient seen and examined at bedside.  No fever, vomiting, worsening abdominal pain reported.  Oral intake remains poor. Objective: Vitals:   07/04/24 0434 07/04/24 1237 07/04/24 2004 07/05/24 0455  BP: 118/77 106/67 (!) 116/98 (!) 141/107  Pulse: 85 97 (!) 105 88  Resp: 18 19 17 18   Temp: 98.8 F (37.1 C) 98 F (36.7 C) 98.9 F (37.2 C) 99.4 F (37.4 C)  TempSrc: Oral Oral Oral Oral  SpO2: 98% (!) 80%  96%  Weight:      Height:        Intake/Output Summary (Last 24 hours) at 07/05/2024 9192 Last  data filed at 07/04/2024 1800 Gross per  24 hour  Intake 1105.65 ml  Output --  Net 1105.65 ml   Filed Weights   07/01/24 0500 07/02/24 0556 07/03/24 0503  Weight: 65.3 kg 63.8 kg 66.7 kg    Examination:  General: On room air.  No distress ENT/neck: No thyromegaly.  JVD is not elevated  respiratory: Decreased breath sounds at bases bilaterally with some crackles; no wheezing  CVS: S1-S2 heard, rate controlled currently Abdominal: Soft, slightly tender, slightly distended; no organomegaly, normal bowel sounds are heard.  Midline abdominal incision present. Extremities: Trace lower extremity edema; no cyanosis  CNS: Awake and alert.  Poor historian.  Slightly confused.  No focal neurologic deficit.  Moves extremities Lymph: No obvious lymphadenopathy Skin: No obvious ecchymosis/lesions  psych: Affect is flat.  Currently not agitated. Musculoskeletal: No obvious joint swelling/deformity     Data Reviewed: I have personally reviewed following labs and imaging studies  CBC: Recent Labs  Lab 07/01/24 0242 07/04/24 0321 07/05/24 0304  WBC 5.3 4.6 5.8  NEUTROABS 2.9 2.6 3.6  HGB 8.5* 9.2* 9.1*  HCT 28.1* 29.3* 30.2*  MCV 92.1 94.2 93.8  PLT 372 295 303   Basic Metabolic Panel: Recent Labs  Lab 07/01/24 0242 07/04/24 0321 07/05/24 0304  NA 141 140 141  K 4.0 3.9 3.8  CL 109 110 109  CO2 21* 20* 20*  GLUCOSE 93 98 106*  BUN 25* 26* 20  CREATININE 0.48 0.51 0.51  CALCIUM  9.0 8.7* 8.9  MG 2.1  --  2.2  PHOS 3.3  --  2.9   GFR: Estimated Creatinine Clearance: 56.1 mL/min (by C-G formula based on SCr of 0.51 mg/dL). Liver Function Tests: Recent Labs  Lab 07/01/24 0242 07/05/24 0304  AST 20 21  ALT 29 24  ALKPHOS 109 104  BILITOT 0.3 0.4  PROT 6.0* 5.9*  ALBUMIN  3.3* 3.4*   No results for input(s): LIPASE, AMYLASE in the last 168 hours. No results for input(s): AMMONIA in the last 168 hours. Coagulation Profile: No results for input(s): INR, PROTIME in the last 168 hours. Cardiac  Enzymes: No results for input(s): CKTOTAL, CKMB, CKMBINDEX, TROPONINI in the last 168 hours. BNP (last 3 results) No results for input(s): PROBNP in the last 8760 hours. HbA1C: No results for input(s): HGBA1C in the last 72 hours. CBG: Recent Labs  Lab 07/03/24 0620 07/03/24 1146 07/03/24 1728 07/04/24 0029 07/05/24 0011  GLUCAP 95 99 93 95 81   Lipid Profile: Recent Labs    07/05/24 0304  TRIG 88   Thyroid  Function Tests: No results for input(s): TSH, T4TOTAL, FREET4, T3FREE, THYROIDAB in the last 72 hours. Anemia Panel: No results for input(s): VITAMINB12, FOLATE, FERRITIN, TIBC, IRON, RETICCTPCT in the last 72 hours. Sepsis Labs: No results for input(s): PROCALCITON, LATICACIDVEN in the last 168 hours.  Recent Results (from the past 240 hours)  SARS Coronavirus 2 by RT PCR (hospital order, performed in [facility] hospital lab) *cepheid single result test* Anterior Nasal Swab     Status: None   Collection Time: 06/29/24  4:29 PM   Specimen: Anterior Nasal Swab  Result Value Ref Range Status   SARS Coronavirus 2 by RT PCR NEGATIVE NEGATIVE Final    Comment: (NOTE) SARS-CoV-2 target nucleic acids are NOT DETECTED.  The SARS-CoV-2 RNA is generally detectable in upper and lower respiratory specimens during the acute phase of infection. The lowest concentration of SARS-CoV-2 viral copies this assay can detect is 250 copies /  mL. A negative result does not preclude SARS-CoV-2 infection and should not be used as the sole basis for treatment or other patient management decisions.  A negative result may occur with improper specimen collection / handling, submission of specimen other than nasopharyngeal swab, presence of viral mutation(s) within the areas targeted by this assay, and inadequate number of viral copies (<250 copies / mL). A negative result must be combined with clinical observations, patient history, and epidemiological  information.  Fact Sheet for Patients:   RoadLapTop.co.za  Fact Sheet for Healthcare Providers: http://kim-miller.com/  This test is not yet approved or  cleared by the [place]  FDA and has been authorized for detection and/or diagnosis of SARS-CoV-2 by FDA under an Emergency Use Authorization (EUA).  This EUA will remain in effect (meaning this test can be used) for the duration of the COVID-19 declaration under Section 564(b)(1) of the Act, 21 U.S.C. section 360bbb-3(b)(1), unless the authorization is terminated or revoked sooner.  Performed at [facility], [address] [place], [place] [address]          Radiology Studies: No results found.      Scheduled Meds:  Chlorhexidine  Gluconate Cloth  6 each Topical Daily   docusate sodium   100 mg Oral BID   enoxaparin  (LOVENOX ) injection  40 mg Subcutaneous Q24H   feeding supplement  237 mL Oral BID BM   Gerhardt's butt cream   Topical BID   methocarbamol  (ROBAXIN ) injection  500 mg Intravenous Q8H   nortriptyline   25 mg Oral QHS   polyethylene glycol  17 g Oral Daily   QUEtiapine   50 mg Oral QAC supper   rivastigmine   9.5 mg Transdermal Daily   sodium chloride  flush  10-40 mL Intracatheter Q12H   Continuous Infusions:  TPN ADULT (ION) 40 mL/hr at 07/04/24 1729          Sophie Mao, MD [facility] 07/05/2024, 8:07 AM

## 2024-07-05 NOTE — Progress Notes (Signed)
 [facility] Progress Note  26 Days Post-Op  Subjective: CC:  Pleasantly confused. Reports sore throat.  Objective: Vital signs in last 24 hours: Temp:  [98 F (36.7 C)-99.4 F (37.4 C)] 99.4 F (37.4 C) (09/22 0455) Pulse Rate:  [88-105] 88 (09/22 0455) Resp:  [17-19] 18 (09/22 0455) BP: (106-141)/(67-107) 141/107 (09/22 0455) SpO2:  [80 %-96 %] 96 % (09/22 0455) Last BM Date : 07/01/24  Intake/Output from previous day: 09/21 0701 - 09/22 0700 In: 1105.7 [P.O.:118; I.V.:987.7] Out: -  Intake/Output this shift: No intake/output data recorded.  PE: Gen:  Alert, NAD, pleasant Card:  Regular rate and rhythm Pulm:  Normal effort Abd: Soft, non-tender, non-distended, midline incision healing as below  Skin: warm and dry, no rashes  Psych: A&Ox3   Lab Results:  Recent Labs    07/04/24 0321 07/05/24 0304  WBC 4.6 5.8  HGB 9.2* 9.1*  HCT 29.3* 30.2*  PLT 295 303   BMET Recent Labs    07/04/24 0321 07/05/24 0304  NA 140 141  K 3.9 3.8  CL 110 109  CO2 20* 20*  GLUCOSE 98 106*  BUN 26* 20  CREATININE 0.51 0.51  CALCIUM  8.7* 8.9   PT/INR No results for input(s): LABPROT, INR in the last 72 hours. CMP     Component Value Date/Time   NA 141 07/05/2024 0304   NA 144 03/02/2024 1554   K 3.8 07/05/2024 0304   CL 109 07/05/2024 0304   CO2 20 (L) 07/05/2024 0304   GLUCOSE 106 (H) 07/05/2024 0304   BUN 20 07/05/2024 0304   BUN 20 03/02/2024 1554   CREATININE 0.51 07/05/2024 0304   CALCIUM  8.9 07/05/2024 0304   PROT 5.9 (L) 07/05/2024 0304   PROT 6.1 03/02/2024 1554   ALBUMIN  3.4 (L) 07/05/2024 0304   ALBUMIN  3.9 03/02/2024 1554   AST 21 07/05/2024 0304   ALT 24 07/05/2024 0304   ALKPHOS 104 07/05/2024 0304   BILITOT 0.4 07/05/2024 0304   BILITOT 0.2 03/02/2024 1554   GFRNONAA >60 07/05/2024 0304   GFRAA >60 01/22/2020 0226   Lipase     Component Value Date/Time   LIPASE 27 05/31/2024 2054       Studies/Results: No results  found.  Anti-infectives: Anti-infectives (From admission, onward)    Start     Dose/Rate Route Frequency Ordered Stop   06/17/24 0900  vancomycin  (VANCOCIN ) IVPB 1000 mg/200 mL premix  Status:  Discontinued        1,000 mg 200 mL/hr over 60 Minutes Intravenous Every 24 hours 06/16/24 0839 06/16/24 1103   06/16/24 1600  metroNIDAZOLE  (FLAGYL ) IVPB 500 mg  Status:  Discontinued        500 mg 100 mL/hr over 60 Minutes Intravenous Every 12 hours 06/16/24 0840 06/16/24 1104   06/16/24 1500  ceFEPIme  (MAXIPIME ) 2 g in sodium chloride  0.9 % 100 mL IVPB  Status:  Discontinued        2 g 200 mL/hr over 30 Minutes Intravenous Every 12 hours 06/16/24 0840 06/16/24 1103   06/16/24 1400  meropenem  (MERREM ) 1 g in sodium chloride  0.9 % 100 mL IVPB  Status:  Discontinued        1 g 200 mL/hr over 30 Minutes Intravenous Every 8 hours 06/16/24 1105 06/23/24 1005   06/16/24 0815  vancomycin  (VANCOREADY) IVPB 1500 mg/300 mL        1,500 mg 150 mL/hr over 120 Minutes Intravenous  Once 06/16/24 0723 06/16/24 1608   06/10/24  0300  ceFEPIme  (MAXIPIME ) 2 g in sodium chloride  0.9 % 100 mL IVPB  Status:  Discontinued        2 g 200 mL/hr over 30 Minutes Intravenous Every 12 hours 06/09/24 1732 06/16/24 0840   06/09/24 2100  metroNIDAZOLE  (FLAGYL ) IVPB 500 mg  Status:  Discontinued        500 mg 100 mL/hr over 60 Minutes Intravenous Every 12 hours 06/09/24 1725 06/16/24 0840   06/09/24 1730  ceFEPIme  (MAXIPIME ) 1 g in sodium chloride  0.9 % 100 mL IVPB  Status:  Discontinued        1 g 200 mL/hr over 30 Minutes Intravenous Every 12 hours 06/09/24 1725 06/09/24 1731   06/09/24 1500  ceFEPIme  (MAXIPIME ) 2 g in sodium chloride  0.9 % 100 mL IVPB        2 g 200 mL/hr over 30 Minutes Intravenous  Once 06/09/24 1454 06/09/24 1743   06/03/24 1130  vancomycin  (VANCOCIN ) IVPB 1000 mg/200 mL premix  Status:  Discontinued        1,000 mg 200 mL/hr over 60 Minutes Intravenous Every 24 hours 06/02/24 1149 06/03/24 0949    06/03/24 0000  ceFEPIme  (MAXIPIME ) 2 g in sodium chloride  0.9 % 100 mL IVPB        2 g 200 mL/hr over 30 Minutes Intravenous Every 12 hours 06/02/24 1149 06/08/24 1553   06/02/24 2200  metroNIDAZOLE  (FLAGYL ) IVPB 500 mg  Status:  Discontinued        500 mg 100 mL/hr over 60 Minutes Intravenous Every 12 hours 06/02/24 1113 06/09/24 1730   06/02/24 1100  ceFEPIme  (MAXIPIME ) 2 g in sodium chloride  0.9 % 100 mL IVPB        2 g 200 mL/hr over 30 Minutes Intravenous  Once 06/02/24 1045 06/02/24 1449   06/02/24 1100  vancomycin  (VANCOCIN ) IVPB 1000 mg/200 mL premix        1,000 mg 200 mL/hr over 60 Minutes Intravenous  Once 06/02/24 1045 06/02/24 1233   06/02/24 1100  metroNIDAZOLE  (FLAGYL ) IVPB 500 mg        500 mg 100 mL/hr over 60 Minutes Intravenous  Once 06/02/24 1045 06/02/24 1818        Assessment/Plan   s/p Procedure(s): LAPAROSCOPY, DIAGNOSTIC, EXPLORATORY LAPAROTOMY WITH SMALL BOWEL RESECTION 06/09/2024  - POD#26 AFVSS, WBC 5.8 - transition to once daily dressing changes, D/C mepitel.  - PT/OT - from a surgical perspective the patient is stable for discharge to SNF with palliative services. TPN/diet mgmt per primary team and palliative. We will sign off. Please call as needed. I have scheduled the patient for follow up in our office in a couple of weeks with Dr. Curvin.     FEN - reg diet; weaning TPN per primary team/palliative VTE - lovenox  ID - no issues Disposition - discharge planning      LOS: 33 days   I reviewed nursing notes, hospitalist notes, last 24 h vitals and pain scores, last 48 h intake and output, last 24 h labs and trends, and last 24 h imaging results.  This care required moderate level of medical decision making.   Almarie Pringle, PA-C [facility] Please see Amion for pager number during day hours 7:00am-4:30pm

## 2024-07-06 ENCOUNTER — Telehealth: Payer: Self-pay | Admitting: Family Medicine

## 2024-07-06 DIAGNOSIS — K56609 Unspecified intestinal obstruction, unspecified as to partial versus complete obstruction: Secondary | ICD-10-CM | POA: Diagnosis not present

## 2024-07-06 DIAGNOSIS — E440 Moderate protein-calorie malnutrition: Secondary | ICD-10-CM

## 2024-07-06 DIAGNOSIS — Z7189 Other specified counseling: Secondary | ICD-10-CM | POA: Diagnosis not present

## 2024-07-06 DIAGNOSIS — Z515 Encounter for palliative care: Secondary | ICD-10-CM

## 2024-07-06 DIAGNOSIS — K5700 Diverticulitis of small intestine with perforation and abscess without bleeding: Secondary | ICD-10-CM | POA: Diagnosis not present

## 2024-07-06 LAB — GLUCOSE, CAPILLARY
Glucose-Capillary: 80 mg/dL (ref 70–99)
Glucose-Capillary: 98 mg/dL (ref 70–99)

## 2024-07-06 NOTE — Progress Notes (Signed)
 Daily Progress Note   Patient Name: Lindsay Mason       Date: 07/06/2024 DOB: 1950-11-07  Age: 73 y.o. MRN#: [id number] Attending Physician: Cheryle Page, MD Primary Care Physician: [name], MD Admit Date: 06/02/2024 Length of Stay: 34 days  Reason for Consultation/Follow-up: Establishing goals of care  Subjective:   Reviewed EMR including recent documentation from hospitalist.  Discussed care with hospitalist and pharmacist to coordinate care.  Patient receiving TPN at this time though will be weaned off today. Reviewed patient's oral intake and continues to be low.  Presented to bedside to see patient.  Patient sitting up in chair at bedside.  Patient's son, granddaughter, and caretaker present at bedside.  Able to introduce myself as a member of the palliative medicine team.  Son, [name], who was present at bedside was able to update this provider regarding patient's oral intake.  Oral intake appears to be decreasing over the past couple of days, not improving.  Son wanted to speak outside of room privately to allow patient to focus on eating at that time.  Able to meet with [name] and granddaughter primarily in separate area to discuss care planning for patient moving forward.  Discussed pathways for medical care.  Discussed current plan is for patient to go to rehab.  Spent time discussing patient's underlying medical illnesses in the setting.  Patient has delirium in setting of dementia.  Patient has had multiple medical setbacks and want to make sure supporting patient's quality of life for what kind of care she would want moving forward.  Son and granddaughter both noted that patient would not find quality of life in her current situation and would want to spend time at home.  Discussed pathway of patient pursuing rehab.  Noted hope with rehab is that patient can continue to regain strength and have medical improvement.  Did express concern that with patient's underlying dementia  with behavioral disturbances and worsening oral intake, will likely continue to deteriorate at rehab and would return to the hospital if continued to worsen. Discussed alternative pathway of patient going home with hospice support.  Spent time explaining philosophy of hospice with focusing on comfort with a prognosis of approximately 6 months or less if patient continues on current medical trajectory.  Discussed generalities about what hospice support would and would not provide.  Noted family would still be primary caregivers for patient.  Expressed that if patient continues to have minimal oral intake and to deteriorate, would be appropriate for hospice if goal is to support patient going home to focus on quality of life and comfort.  Spent time answering questions regarding this.  Grossly granddaughter noted they would discuss this with other family members to make determination about pathway for medical care moving forward.  At this time, we will continue with current plan of patient going to rehab.  Noted would inform TOC of their first choice of [facility] rehab and second choice of [facility].  All questions answered at that time.  Noted palliative medicine team to continue following with patient's medical journey.  Discussed care with hospitalist, TOC, PT/OT, and RN to coordinate care after visit.  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  During prolonged hospitalization patient has received management for small bowel obstruction requiring diagnostic laparoscopy and exploratory laparotomy on 06/10/2023; antibiotic management for sepsis secondary to obstruction and intra-abdominal abscesses; acute metabolic encephalopathy and delirium in setting of dementia; dysphagia.  Palliative medicine team consulted to assist with complex medical decision making.  Recommendations/Plan: # Complex medical decision making/goals of care:  - Patient unable to participate in complex medical decision making due to underlying medical status.  - Discussed care with patient's son, [name], and granddaughter as detailed above in HPI.  Discussed possible pathways for medical care moving forward.  Current plan is for patient to go to rehab.  Noted while hope of rehab is for patient to stay there and regain strength, concerned that with patient's delirium in setting of dementia and poor oral intake, could quickly be readmitted from rehab facility.  Inquired about patient's hopes regarding her quality of life.  Son and granddaughter noted that patient would want time at home.  Discussed alternative pathway of going home with hospice support with patient's known underlying medical comorbidities.  At this time continuing with plan to pursue rehab.  [name] planning to discuss with other family members consideration of going home with hospice if they determined that is the pathway they would want to pursue.  TOC assisting with discharge coordination.  Palliative medicine team continuing to follow with patient's medical journey.  -  Code Status: Limited: Do not attempt resuscitation (DNR) -DNR-LIMITED -Do Not Intubate/DNI   # Psychosocial Support:  - Husband (son noted cognitive impairment issues), 2 daughters,  son  # Discharge Planning: To Be Determined  Discussed with: Patient's son and granddaughter, RN, [facility], hospitalist  Thank you for allowing the palliative care team to participate in the care Arland A Buschman.  Tinnie Radar, DO Palliative Care Provider PMT # [phone]  If patient remains symptomatic despite maximum doses, please call PMT at [phone] between 0700 and 1900. Outside of these hours, please call attending, as PMT does not have night coverage.  Personally spent 55 minutes in patient care including extensive chart review (labs, imaging, progress/consult notes, vital signs), medically appropraite exam, discussed with treatment team, education to patient, family, and staff, documenting clinical information, medication review and management, coordination of care, and available advanced directive documents.

## 2024-07-06 NOTE — Telephone Encounter (Signed)
 Copied from CRM #[id number]. Topic: General - Other >> Jul 06, 2024  1:56 PM [name] wrote: Reason for CRM: Patient's son, [name], called stating that his mom is needing a letter incapacitation as she is currently in the hospital and has been for 34 days. Please call [name] or [name] (husband) when letter is ready for pick up.

## 2024-07-06 NOTE — Progress Notes (Signed)
 Occupational Therapy Treatment Patient Details Name: Lindsay Mason MRN: [id number] DOB: 1951/06/18 Today's Date: 07/06/2024   History of present illness Pt is 73 yo female presented on 06/02/24  with + blood culture with staph species, lethargy, and worsening mental status. Pt admitted with SIRS, abdominal pain, N/V likely secondary to acute diverticulitis, and SBO vs ileus.  Pt with NG tube and surgery following. s/p exploratory lap with SB resection 2* ischemic bowel 06/09/24.  Drain placement for pelvic abscess 06/16/24. Currently on TPN.  Pt also with metabolic encephalopathy on background of Alzheimer's dementia.  Other hx includes but not limited to  COPD, hyperlipidemia, history of right lower lobe adenocarcinoma treated with lobectomy in 2022, peripheral arterial disease, thoracic and abdominal aortic aneurysm status, dementia with behavioral disturbance   OT comments  Pt making progress with functional goals. Pt required step-by-step prompts, intermittent demonstrations, cues for initiation, sustained attention, task continuation short-term recall, and thoroughness with tasks. OT will follow acutely to maximize level of functional and safety      If plan is discharge home, recommend the following:  Assistance with feeding;Direct supervision/assist for medications management;Assistance with cooking/housework;Direct supervision/assist for financial management;Assist for transportation;Help with stairs or ramp for entrance;Supervision due to cognitive status;A lot of help with bathing/dressing/bathroom;A little help with walking and/or transfers   Equipment Recommendations       Recommendations for Other Services      Precautions / Restrictions Precautions Precautions: Fall Recall of Precautions/Restrictions: Impaired Precaution/Restrictions Comments: abdominal SX Required Braces or Orthoses: Other Brace Other Brace: abdominal binder Restrictions Weight Bearing Restrictions Per  Provider Order: No Other Position/Activity Restrictions: abdominal surgery/precautions       Mobility Bed Mobility Overal bed mobility: Needs Assistance Bed Mobility: Supine to Sit     Supine to sit: Min assist     General bed mobility comments: required increased cues for initiation and problem solving. assist for LEs on/off bed. Increased time.    Transfers Overall transfer level: Needs assistance Equipment used: 1 person hand held assist Transfers: Sit to/from Stand, Bed to chair/wheelchair/BSC Sit to Stand: Min assist     Step pivot transfers: Min assist     General transfer comment: Min A to initiate, power up, steady.     Balance Overall balance assessment: Needs assistance Sitting-balance support: No upper extremity supported, Feet supported Sitting balance-Leahy Scale: Fair     Standing balance support: Bilateral upper extremity supported, During functional activity Standing balance-Leahy Scale: Poor                             ADL either performed or assessed with clinical judgement   ADL Overall ADL's : Needs assistance/impaired Eating/Feeding: Minimal assistance;Sitting Eating/Feeding Details (indicate cue type and reason): required set up of lunch tray, cutting food up, opening containers, hand over hand initiating eating with R hand; pt has difficulty with carryover and task continuation Grooming: Minimal assistance;Sitting;Cueing for sequencing Grooming Details (indicate cue type and reason): Pt required step-by-step prompts,  intermittent demonstrations, cues for initiation, sustained attention, task continuation short-term recall, and thoroughness with tasks, in order to perform face washing and hair combing seated in the chair.         Upper Body Dressing : Minimal assistance;Cueing for sequencing       Toilet Transfer: Minimal assistance;Cueing for safety;Cueing for sequencing;Ambulation;Grab bars   Toileting- Clothing Manipulation  and Hygiene: Moderate assistance;Sit to/from stand Toileting - Clothing Manipulation Details (indicate cue type  and reason): ADL instruction was provided for toileting management at bathroom level. She required increased assist for most all tasks, given her impaired cognition. Increased time and cues were needed throughout, notably cues and prompts for problem solving, attention, and sequencing.            Extremity/Trunk Assessment Upper Extremity Assessment Upper Extremity Assessment: Generalized weakness   Lower Extremity Assessment Lower Extremity Assessment: Defer to PT evaluation   Cervical / Trunk Assessment Cervical / Trunk Assessment: Normal    Vision Ability to See in Adequate Light: 0 Adequate Patient Visual Report: No change from baseline     Perception     Praxis     Communication Communication Communication: Impaired Factors Affecting Communication: Difficulty expressing self   Cognition Arousal: Alert Behavior During Therapy: WFL for tasks assessed/performed Cognition: History of cognitive impairments, Cognition impaired             OT - Cognition Comments: extremely confused, difficulty following  commands, poor problem solving, not aware of deficits, oriented to self only.                 Following commands: Impaired Following commands impaired: Follows one step commands inconsistently      Cueing   Cueing Techniques: Verbal cues, Gestural cues, Tactile cues, Visual cues  Exercises      Shoulder Instructions       General Comments      Pertinent Vitals/ Pain       Pain Assessment Pain Assessment: No/denies pain Pain Score: 0-No pain Pain Descriptors / Indicators: Sore, Discomfort Pain Intervention(s): Monitored during session, Repositioned  Home Living                                          Prior Functioning/Environment              Frequency  Min 2X/week        Progress Toward Goals  OT  Goals(current goals can now be found in the care plan section)  Progress towards OT goals: Progressing toward goals     Plan      Co-evaluation    PT/OT/SLP Co-Evaluation/Treatment: Yes            AM-PAC OT 6 Clicks Daily Activity     Outcome Measure   Help from another person eating meals?: None Help from another person taking care of personal grooming?: A Little Help from another person toileting, which includes using toliet, bedpan, or urinal?: A Lot Help from another person bathing (including washing, rinsing, drying)?: A Lot Help from another person to put on and taking off regular upper body clothing?: A Little Help from another person to put on and taking off regular lower body clothing?: A Lot 6 Click Score: 16    End of Session Equipment Utilized During Treatment: Gait belt  OT Visit Diagnosis: Unsteadiness on feet (R26.81);Other abnormalities of gait and mobility (R26.89);Muscle weakness (generalized) (M62.81);Other symptoms and signs involving cognitive function;Pain Pain - Right/Left: Left   Activity Tolerance Patient tolerated treatment well   Patient Left in chair;with call bell/phone within reach;with family/visitor present;with chair alarm set   Nurse Communication Mobility status        Time: 8893-8855 OT Time Calculation (min): 38 min  Charges: OT General Charges $OT Visit: 1 Visit OT Treatments $Self Care/Home Management : 23-37 mins $Therapeutic Activity: 8-22 mins  [facility] 07/06/2024, 1:27 PM

## 2024-07-06 NOTE — Plan of Care (Signed)
  Problem: Fluid Volume: Goal: Hemodynamic stability will improve Outcome: Progressing   Problem: Clinical Measurements: Goal: Signs and symptoms of infection will decrease Outcome: Progressing

## 2024-07-06 NOTE — Progress Notes (Signed)
 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  General surgery consulted for abdominal pain and distention.  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  She eventually underwent exploratory laparotomy with small bowel resection on 06/09/2024.  Subsequently, patient required TPN with very poor oral intake.  Repeat CT scan on 06/15/2024 revealed findings suggestive for abscesses.  She underwent IR drain placement on 06/16/2024.  Palliative care following for goals of care and because of overall very poor prognosis.  Assessment & Plan:   Sepsis: Present on admission; resolved Concern for small bowel obstruction versus small bowel ischemia versus volvulus with possible microperforation Intra-abdominal abscesses - Underwent diagnostic laparoscopy, exploratory laparotomy with small bowel resection by general surgery on 06/10/2023.  Treated with cefepime  and Flagyl  for 14 days.   - Subsequently, patient required TPN with very poor oral intake.  Repeat CT scan on 06/15/2024 revealed findings suggestive for abscesses.  She underwent IR drain placement on 06/16/2024.  - Repeat CT on 06/20/2024 showed new right lower quadrant collection and persistent central pelvic collection: Drain removed by IR on 06/22/2024. -General Surgery following intermittently. -Currently on TPN. - Palliative care following.  Family has opted to wean off TPN over the next few days and to encourage patient to increase oral intake.   Not a good candidate for PEG tube placement.  SLP following.  Dietitian following - Patient will need palliative care follow-up at discharge  Acute metabolic encephalopathy/delirium Alzheimer's dementia - Continue delirium precautions.  Currently getting trial of Seroquel .  Avoid other sedating medications as able unless necessary.  Continue as needed Haldol   Anemia of chronic disease - From chronic illnesses.  Monitor intermittently  Acute metabolic acidosis - Mild.  Monitor intermittently  Staph hominis bacteremia - 06/01/2024 blood culture positive for Staph hominis: Presumed contaminant.  COPD with emphysema - Stable.  Continue as needed albuterol   Hyperlipidemia PAD Thoracic aortic atherosclerosis - Lipitor on hold.  Essential hypertension -Blood pressure stable.  Continue as needed IV hydralazine   Physical deconditioning - PT/OT recommending SNF placement.  TOC following  Dysphagia - Diet as per SLP recommendations  DVT prophylaxis: Lovenox  Code Status: DNR Family Communication: Daughter and granddaughter at bedside on 07/04/2024.  Caregiver at bedside today Disposition Plan: Status is: Inpatient Remains inpatient appropriate because: Of severity of illness.  Need for SNF placement.  Still on TPN    Consultants: General surgery/IR/palliative care  Procedures: As above  Antimicrobials:  None currently  Subjective: Patient seen and examined at bedside.  No agitation, fever, vomiting reported.  Oral intake is slightly better as per caregiver at bedside  objective: Vitals:   07/05/24 1446 07/05/24 2015 07/06/24 0500 07/06/24 0542  BP: 115/85 (!) 130/95  114/83  Pulse: 89 100  89  Resp: 18 18  18   Temp: 98.5 F (36.9 C) 98.5 F (36.9 C)  98.4 F (36.9 C)  TempSrc: Oral Oral  Oral  SpO2: 98% 100%  99%  Weight:   66.4 kg   Height:        Intake/Output Summary (Last 24 hours) at 07/06/2024 0850 Last  data filed at 07/05/2024 2100 Gross per 24 hour   Intake 240 ml  Output 400 ml  Net -160 ml   Filed Weights   07/02/24 0556 07/03/24 0503 07/06/24 0500  Weight: 63.8 kg 66.7 kg 66.4 kg    Examination:  General: No acute distress.  On room air currently.  Looks chronically ill and deconditioned  ENT/neck: No JVD elevation or palpable neck masses respiratory: Bilateral decreased breath sounds at bases with scattered crackles CVS: Rate mostly controlled; S1 and S2 are heard  abdominal: Soft, mildly tender and distended; no organomegaly, bowel sounds are heard.  Midline abdominal incision present. Extremities: No clubbing; mild lower extremity edema present CNS: Awake; slow to respond.  Very poor historian.  Slightly confused.  No focal neurologic deficit.  Able to move extremities Lymph: No obvious palpable lymphadenopathy Skin: No obvious petechiae/rashes psych: Showing no signs of agitation.  Affect is flat mostly.   Musculoskeletal: No obvious joint tenderness/erythema    Data Reviewed: I have personally reviewed following labs and imaging studies  CBC: Recent Labs  Lab 07/01/24 0242 07/04/24 0321 07/05/24 0304  WBC 5.3 4.6 5.8  NEUTROABS 2.9 2.6 3.6  HGB 8.5* 9.2* 9.1*  HCT 28.1* 29.3* 30.2*  MCV 92.1 94.2 93.8  PLT 372 295 303   Basic Metabolic Panel: Recent Labs  Lab 07/01/24 0242 07/04/24 0321 07/05/24 0304  NA 141 140 141  K 4.0 3.9 3.8  CL 109 110 109  CO2 21* 20* 20*  GLUCOSE 93 98 106*  BUN 25* 26* 20  CREATININE 0.48 0.51 0.51  CALCIUM  9.0 8.7* 8.9  MG 2.1  --  2.2  PHOS 3.3  --  2.9   GFR: Estimated Creatinine Clearance: 56 mL/min (by C-G formula based on SCr of 0.51 mg/dL). Liver Function Tests: Recent Labs  Lab 07/01/24 0242 07/05/24 0304  AST 20 21  ALT 29 24  ALKPHOS 109 104  BILITOT 0.3 0.4  PROT 6.0* 5.9*  ALBUMIN  3.3* 3.4*   No results for input(s): LIPASE, AMYLASE in the last 168 hours. No results for input(s): AMMONIA in the last 168 hours. Coagulation Profile: No  results for input(s): INR, PROTIME in the last 168 hours. Cardiac Enzymes: No results for input(s): CKTOTAL, CKMB, CKMBINDEX, TROPONINI in the last 168 hours. BNP (last 3 results) No results for input(s): PROBNP in the last 8760 hours. HbA1C: No results for input(s): HGBA1C in the last 72 hours. CBG: Recent Labs  Lab 07/03/24 1728 07/04/24 0029 07/05/24 0011 07/05/24 1302 07/05/24 1758  GLUCAP 93 95 81 112* 88   Lipid Profile: Recent Labs    07/05/24 0304  TRIG 88   Thyroid  Function Tests: No results for input(s): TSH, T4TOTAL, FREET4, T3FREE, THYROIDAB in the last 72 hours. Anemia Panel: No results for input(s): VITAMINB12, FOLATE, FERRITIN, TIBC, IRON, RETICCTPCT in the last 72 hours. Sepsis Labs: No results for input(s): PROCALCITON, LATICACIDVEN in the last 168 hours.  Recent Results (from the past 240 hours)  SARS Coronavirus 2 by RT PCR (hospital order, performed in [facility] hospital lab) *cepheid single result test* Anterior Nasal Swab     Status: None   Collection Time: 06/29/24  4:29 PM   Specimen: Anterior Nasal Swab  Result Value Ref Range Status   SARS Coronavirus 2 by RT PCR NEGATIVE NEGATIVE Final    Comment: (NOTE) SARS-CoV-2 target nucleic acids are NOT DETECTED.  The SARS-CoV-2 RNA is generally detectable in upper and lower respiratory specimens during the acute phase  of infection. The lowest concentration of SARS-CoV-2 viral copies this assay can detect is 250 copies / mL. A negative result does not preclude SARS-CoV-2 infection and should not be used as the sole basis for treatment or other patient management decisions.  A negative result may occur with improper specimen collection / handling, submission of specimen other than nasopharyngeal swab, presence of viral mutation(s) within the areas targeted by this assay, and inadequate number of viral copies (<250 copies / mL). A negative result must be  combined with clinical observations, patient history, and epidemiological information.  Fact Sheet for Patients:   RoadLapTop.co.za  Fact Sheet for Healthcare Providers: http://kim-miller.com/  This test is not yet approved or  cleared by the [place]  FDA and has been authorized for detection and/or diagnosis of SARS-CoV-2 by FDA under an Emergency Use Authorization (EUA).  This EUA will remain in effect (meaning this test can be used) for the duration of the COVID-19 declaration under Section 564(b)(1) of the Act, 21 U.S.C. section 360bbb-3(b)(1), unless the authorization is terminated or revoked sooner.  Performed at [facility], [address], [place], [place] [address]   Resp panel by RT-PCR (RSV, Flu A&B, Covid) Anterior Nasal Swab     Status: None   Collection Time: 07/05/24 11:36 AM   Specimen: Anterior Nasal Swab  Result Value Ref Range Status   SARS Coronavirus 2 by RT PCR NEGATIVE NEGATIVE Final    Comment: (NOTE) SARS-CoV-2 target nucleic acids are NOT DETECTED.  The SARS-CoV-2 RNA is generally detectable in upper respiratory specimens during the acute phase of infection. The lowest concentration of SARS-CoV-2 viral copies this assay can detect is 138 copies/mL. A negative result does not preclude SARS-Cov-2 infection and should not be used as the sole basis for treatment or other patient management decisions. A negative result may occur with  improper specimen collection/handling, submission of specimen other than nasopharyngeal swab, presence of viral mutation(s) within the areas targeted by this assay, and inadequate number of viral copies(<138 copies/mL). A negative result must be combined with clinical observations, patient history, and epidemiological information. The expected result is Negative.  Fact Sheet for Patients:  BloggerCourse.com  Fact Sheet for  Healthcare Providers:  SeriousBroker.it  This test is no t yet approved or cleared by the [place]  FDA and  has been authorized for detection and/or diagnosis of SARS-CoV-2 by FDA under an Emergency Use Authorization (EUA). This EUA will remain  in effect (meaning this test can be used) for the duration of the COVID-19 declaration under Section 564(b)(1) of the Act, 21 U.S.C.section 360bbb-3(b)(1), unless the authorization is terminated  or revoked sooner.       Influenza A by PCR NEGATIVE NEGATIVE Final   Influenza B by PCR NEGATIVE NEGATIVE Final    Comment: (NOTE) The Xpert Xpress SARS-CoV-2/FLU/RSV plus assay is intended as an aid in the diagnosis of influenza from Nasopharyngeal swab specimens and should not be used as a sole basis for treatment. Nasal washings and aspirates are unacceptable for Xpert Xpress SARS-CoV-2/FLU/RSV testing.  Fact Sheet for Patients: BloggerCourse.com  Fact Sheet for Healthcare Providers: SeriousBroker.it  This test is not yet approved or cleared by the [place]  FDA and has been authorized for detection and/or diagnosis of SARS-CoV-2 by FDA under an Emergency Use Authorization (EUA). This EUA will remain in effect (meaning this test can be used) for the duration of the COVID-19 declaration under Section 564(b)(1) of the Act, 21 U.S.C. section 360bbb-3(b)(1), unless the  authorization is terminated or revoked.     Resp Syncytial Virus by PCR NEGATIVE NEGATIVE Final    Comment: (NOTE) Fact Sheet for Patients: BloggerCourse.com  Fact Sheet for Healthcare Providers: SeriousBroker.it  This test is not yet approved or cleared by the [place]  FDA and has been authorized for detection and/or diagnosis of SARS-CoV-2 by FDA under an Emergency Use Authorization (EUA). This EUA will remain in effect (meaning this  test can be used) for the duration of the COVID-19 declaration under Section 564(b)(1) of the Act, 21 U.S.C. section 360bbb-3(b)(1), unless the authorization is terminated or revoked.  Performed at [facility], [address], [place], [place] [address]          Radiology Studies: No results found.      Scheduled Meds:  Chlorhexidine  Gluconate Cloth  6 each Topical Daily   docusate sodium   100 mg Oral BID   enoxaparin  (LOVENOX ) injection  40 mg Subcutaneous Q24H   feeding supplement  237 mL Oral BID BM   Gerhardt's butt cream   Topical BID   methocarbamol  (ROBAXIN ) injection  500 mg Intravenous Q8H   multivitamin with minerals  1 tablet Oral Daily   nortriptyline   25 mg Oral QHS   polyethylene glycol  17 g Oral Daily   QUEtiapine   50 mg Oral QAC supper   rivastigmine   9.5 mg Transdermal Daily   sodium chloride  flush  10-40 mL Intracatheter Q12H   Continuous Infusions:  TPN ADULT (ION) 20 mL/hr at 07/05/24 1743          Sophie Mao, MD [facility] 07/06/2024, 8:50 AM

## 2024-07-06 NOTE — Telephone Encounter (Signed)
 This would need to come from hospital right? She is still admitted at this time.

## 2024-07-06 NOTE — Progress Notes (Signed)
 PHARMACY - TOTAL PARENTERAL NUTRITION CONSULT NOTE   Indication: Prolonged ileus  Patient Measurements: Height: 5' 2 (157.5 cm) Weight: 66.4 kg (146 lb 6.2 oz) IBW/kg (Calculated) : 50.1 TPN AdjBW (KG): 61.3 Body mass index is 26.77 kg/m.  Assessment: Presented to ED with N/V/D and abdominal pain x 3d on 05/31/24. CT negative. Possibly from diverticulitis and placed on Cefepime /Flagyl . Concern for SBO, NGT placed. Continues to have persistent pain and distention. No significant intake since 8/21. Pharmacy consulted to start TPN on 8/26. S/p ex lap with small bowel resection on 8/27.    Glucose / Insulin : No h/o DM -CBGs within goal < 150 Electrolytes: WNL including coCa Renal: Scr and BUN WNL Hepatic: LFTs WNL; alb 3.4; TG stable Intake / Output; MIVF:  -LBM documented 9/18, UOP minimal documentation -Minimal PO intake documented  GI Imaging: 8/19 CT: Scattered colonic diverticulosis. No active diverticulitis.   8/22, 8/23, 8/25, 8/26 Abd Xray: all SBO  8/26 CT: severely thickened and inflamed central pelvic loops of small bowel concerning for ischemia. Concern for mesenteric volvulus or closed loop obstruction.  9/2 CT: 1. Interval abdominal surgery. There is a small amount of free air in the upper abdomen likely related to recent surgery. 2. New enhancing fluid collections in the right paracolic gutter and central pelvis worrisome for abscesses. 3. Persistent clustered small bowel loops in the pelvis with wall thickening and mesenteric edema. There is a small amount of air within the mesentery at this level which is new from prior. 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 4. Dilated small bowel loops in the upper abdomen worrisome for small bowel obstruction. 5. Diffuse bladder wall thickening with small amount of air in the bladder. Findings may be related to recent instrumentation or infection. 6. Small amount of simple free fluid in the right upper quadrant and left upper  quadrant. 7. Body wall edema. 8. Aortic atherosclerosis.  GI Surgeries / Procedures:  8/27 Ex lap with small bowel resection 9/3 IR drain placement to abd/pelvic fluid collection  Central access: PICC TPN start date: 8/26  Nutritional Goals: Goal TPN rate is 80 mL/hr (provides 86 g of protein and  1759 kcals per day)  RD Assessment: Kcal 1650-1850 Protein 75-90 g Fluid 1.8 L/day  Current Nutrition:  Ensure Plus High Protein BID Magic cup TIDAC  Patient initially with minimal PO intake which had been improving with assistance from family and staff. TPN was weaned to half rate in anticipation of continued PO intake. Unfortunately, intake has decreased. Intake has remained low at ~ 20-25% or less of some meals.   Palliative care meeting with family done 9/18. Per TRH, family considering PEG if oral intake remains poor however it sounds like family has decided against this. Overall patient meets about 50% of needs at best based on previous calorie counts.   Family prefer slow weaning of TPN which we have been doing for the past several days. Slightly better intake this morning per documentation and caregiver at bedside.  Plan:  TPN currently hanging at 20 ml/hr. Per discussion with hospitalist and palliative care provider, we have been weaning TPN slowly per family's request. The current bag of TPN will stop tonight at 1800. No further orders for TPN to be placed. Pharmacy to sign off consult.   Stefano MARLA Bologna, PharmD, BCPS Clinical Pharmacist 07/06/2024 11:53 AM

## 2024-07-07 DIAGNOSIS — Z7189 Other specified counseling: Secondary | ICD-10-CM | POA: Diagnosis not present

## 2024-07-07 DIAGNOSIS — Z515 Encounter for palliative care: Secondary | ICD-10-CM | POA: Diagnosis not present

## 2024-07-07 DIAGNOSIS — G300 Alzheimer's disease with early onset: Secondary | ICD-10-CM | POA: Diagnosis not present

## 2024-07-07 DIAGNOSIS — Z9049 Acquired absence of other specified parts of digestive tract: Secondary | ICD-10-CM | POA: Diagnosis not present

## 2024-07-07 LAB — GLUCOSE, CAPILLARY
Glucose-Capillary: 86 mg/dL (ref 70–99)
Glucose-Capillary: 91 mg/dL (ref 70–99)
Glucose-Capillary: 94 mg/dL (ref 70–99)
Glucose-Capillary: 95 mg/dL (ref 70–99)

## 2024-07-07 NOTE — Progress Notes (Signed)
 [id number] [facility] Liaison Note  Received a referral for hospice services at home after discharge from [facility]. Spoke with [name], son, on the telephone to initiate education related to hospice philosophy, services and team approach to care. Family verbalized understanding of information given.  Per discussion, the plan is for discharge home via private vehicle with hospice on Thursday, 9.25.  Per family, patient has no DME needs at this time.  Syria Kestner, son, [phone] or Adilen Pavelko, daughter, [phone] is the family contact.  Please send signed and completed DNR home with the patient.  Please provide prescriptions at discharge as needed to ensure ongoing symptom management.  [facility] information and contact numbers given to [place].  Above information shared with Sheri Sharps, Transitions of Care Manager. Please call with any questions or concerns.  Thank you for the opportunity to participate in this patient's care.  Inocente Jacobs, BSN, RN [employer] [phone]

## 2024-07-07 NOTE — TOC Progression Note (Signed)
 Transition of Care [facility]) - Progression Note    Patient Details  Name: Lindsay Mason MRN: [id number] Date of Birth: [birth year]  Transition of Care [facility]) CM/SW Contact  Sheri ONEIDA Sharps, [place] Phone Number: 07/07/2024, 3:16 PM  Clinical Narrative:    [facility] consulted per pt son request. Plan is for pt to dc home w/ family 9/25.    Expected Discharge Plan: Skilled Nursing Facility Barriers to Discharge: Continued Medical Work up               Expected Discharge Plan and Services In-house Referral: Clinical Social Work Discharge Planning Services: CM Consult Post Acute Care Choice: Skilled Nursing Facility                   DME Arranged: N/A DME Agency: NA                   Social Drivers of Health (SDOH) Interventions SDOH Screenings   Food Insecurity: No Food Insecurity (06/02/2024)  Housing: Low Risk  (06/02/2024)  Transportation Needs: No Transportation Needs (06/02/2024)  Utilities: Not At Risk (06/02/2024)  Alcohol Screen: Low Risk  (05/05/2024)  Depression (PHQ2-9): High Risk (05/07/2024)  Financial Resource Strain: Low Risk  (05/05/2024)  Physical Activity: Sufficiently Active (05/05/2024)  Social Connections: Moderately Isolated (06/02/2024)  Stress: No Stress Concern Present (05/05/2024)  Tobacco Use: Medium Risk (06/09/2024)  Health Literacy: Inadequate Health Literacy (05/05/2024)    Readmission Risk Interventions    06/14/2024    2:25 PM 01/11/2024   10:01 AM  Readmission Risk Prevention Plan  Post Dischage Appt  Complete  Medication Screening  Complete  Transportation Screening Complete Complete  Medication Review [employer]) Complete   HRI or Home Care Consult Complete   SW Recovery Care/Counseling Consult Complete   Palliative Care Screening Not Applicable   Skilled Nursing Facility Complete

## 2024-07-07 NOTE — Plan of Care (Signed)

## 2024-07-07 NOTE — Progress Notes (Signed)
  Daily Progress Note   Patient Name: Lindsay Mason       Date: 07/07/2024 DOB: [birth year]  Age: 73 y.o. MRN#: [id number] Attending Physician: Barbarann Nest, MD Primary Care Physician: [name], MD Admit Date: 06/02/2024 Length of Stay: 35 days  Reason for Consultation/Follow-up: Establishing goals of care  Subjective:   Reviewed EMR including recent documentation from hospitalist.  Reviewed EMR including patient's I's and O's.  Oral intake remains low.  Presented to bedside to see patient.  Patient sitting up in chair with caregiver present.  No family present at bedside.  Caregiver able to update this provider patient's son, [name], had been present earlier in the day.  Caregiver noted that patient's oral intake remains low.  Patient did have a good bowel movement so feeling better from that perspective.  Patient continues to ask when she can go.  Noted will coordinate with team regarding decisions family has made about pathways for medical care moving forward.  Discussed care with hospitalist, TOC, and ACC liaison.  Hospitalist informed that family has decided they want patient to go home with hospice support through [facility].  Hoping patient can potentially go home tomorrow.  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  During prolonged hospitalization patient has received management for small bowel obstruction requiring diagnostic laparoscopy and exploratory laparotomy on 06/10/2023; antibiotic management for sepsis secondary to obstruction and intra-abdominal abscesses; acute metabolic encephalopathy and delirium in setting of dementia; dysphagia.  Palliative medicine team consulted to assist with complex medical decision making.  Recommendations/Plan: # Complex medical decision making/goals of care:  - Patient unable to participate in complex medical decision making due to underlying medical status.  - Had already care with patient's son, [name], and granddaughter on 07/06/2024 discussing possible pathways for care moving forward including potentially going home with hospice support.  Informed by hospitalist today that family has decided they would like to get patient home with [facility] hospice tomorrow.  ACC liaison and TOC assisting with discharge coordination.  Palliative medicine team continuing to follow along to engage in conversations as able and appropriate.  -  Code Status: Limited: Do not attempt resuscitation (DNR) -DNR-LIMITED -Do Not Intubate/DNI   # Psychosocial Support:  - Husband (son noted cognitive impairment issues), 2 daughters, son  # Discharge Planning: Home with Hospice  Thank you for allowing the palliative care team to participate in the care [facility] Staff.  Tinnie Radar, DO Palliative Care Provider PMT # [phone]  If patient remains symptomatic despite maximum doses, please call PMT at [phone] between 0700 and 1900. Outside of these hours, please call attending, as PMT does not have night coverage.

## 2024-07-07 NOTE — Telephone Encounter (Addendum)
 I spoke with son [name].  Needs letter of incapacity from PCP.   Pt has had prolonged complicated hospitalization since 06/02/2024 due to abd pain, lethargy, found to have staph bacteremia, small bowel ischemia s/p exploratory laparotomy with small bowel resection, has been on TPN with poor oral intake. Earlier this month had IR guided drain placement for intraabdominal abscesses. Family declined PEG placement, she is weaning off TPN, planned discharge home with family and home hospice.   Letter written and in Amy's box.  Son will come pick this up tomorrow morning.

## 2024-07-07 NOTE — Progress Notes (Signed)
 Progress Note   Patient: Lindsay Mason FMW:[id number] DOB: 07/21/1951 DOA: 06/02/2024     35 DOS: the patient was seen and examined on 07/07/2024   Brief hospital course: 73yo with h/o COPD, HLD, RLL lung CA s/p lobectomy, PAD, and dementia who presented on 8/20 with lethargy, found to have staph bacteremia. She complained of abdominal pain and CT showed concern for small bowel ischemia. Surgery performed  exploratory laparotomy with a small bowel resection on 8/27.  Patient has been on TPN, with very poor oral intake.  Repeat CT scan 9/02 with new enhancing fluid collections worrisome for abscesses. IR consulted for drain placement, which occurred on 9/03.  Family has declined PEG and she is weaning off TPN.  Palliative care is following, likely to dc home with hospice on 9/25.  Assessment and Plan:  Sepsis due to small bowel enteritis, resolved Patient initially had leukocytosis with hypotension and lethargy Repeat CT 8/26 showed severely thickened and inflamed central pelvic loops of small bowel concerning for ischemia\ Followed by surgery and taken to the OR on 06/09/2024 for diagnostic laparoscopy, exploratory laparotomy with small bowel resection Treated with cefepime  and metronidazole  x 14 days Blood cultures negative Repeat CT 9/7 showed new fluid collection RLQ 4.1 x 2.6 cm and persistent central pelvic collection measuring 3.6 x 3.5 cm Underwent IR drain placement which has since been removed Continues to have poor PO intake Was on TPN but this has been weaned off Based on advanced dementia with poor PO intake, after GOC conversations with palliative care the family has decided to take her home with hospice  Metabolic Encephalopathy in the setting of Alzheimer's dementia Presumed worsening in setting of acute infection; son states encephalopathy worsens when she is sick Continue delirium precautions Sundowning has improved with addition of Seroquel   Blood culture  contamination 06/01/2024 culture noted with Staph hominis, presumed contaminant No further workup   COPD with emphysema  Continue albuterol  inhaler   Hyperlipidemia PAD (peripheral artery disease)  Thoracic aortic atherosclerosis Holding Lipitor   History of essential hypertension On as needed IV hydralazine    Weakness, deconditioning Seen by PT/OT and recommended skilled nursing facility placement  After ongoing GOC conversation with palliative care assistance, family has instead decided to take her home with hospice   Dysphagia  s/p SLP eval Continue regular diet  Nutrition Status: Nutrition Problem: Moderate Malnutrition Etiology: chronic illness (dementia) Signs/Symptoms: moderate fat depletion, severe muscle depletion Interventions: TPN, Ensure Enlive (each supplement provides 350kcal and 20 grams of protein), Boost Breeze   DNR DNR confirmed at the time of admission [facility] documents reviewed Patient will need a gold out of facility DNR form at the time of discharge      Consultants: Surgery IR Palliative care PT OT SLP Nutrition TOC team  Procedures: Ex lap with small bowel resection 8/27 CT-guided drain placement 9/3  Antibiotics: Cefepime  8/20-9/3 Metronidazole  8/20-9/3 Vancomycin  x 3 doses   30 Day Unplanned Readmission Risk Score    Flowsheet Row ED to Hosp-Admission (Current) from 06/02/2024 [facility]  30 Day Unplanned Readmission Risk Score (%) 35.58 Filed at 07/07/2024 0400    This score is the patient's risk of an unplanned readmission within 30 days of being discharged (0 -100%). The score is based on dignosis, age, lab data, medications, orders, and past utilization.   Low:  0-14.9   Medium: 15-21.9   High: 22-29.9   Extreme: 30 and above  Subjective: Pleasant, up in chair with multiple family members at the bedside.  She is oriented to person only and thinks her husband is her  son.  No complaints.  Not eating well.   Objective: Vitals:   07/07/24 0559 07/07/24 1443  BP: 113/75 115/72  Pulse: 93 96  Resp: 20 19  Temp: 98.9 F (37.2 C) 99.1 F (37.3 C)  SpO2: 98% 92%    Intake/Output Summary (Last 24 hours) at 07/07/2024 1522 Last data filed at 07/07/2024 1300 Gross per 24 hour  Intake 560.71 ml  Output --  Net 560.71 ml   Filed Weights   07/03/24 0503 07/06/24 0500 07/07/24 0559  Weight: 66.7 kg 66.4 kg 65 kg    Exam:  General:  Appears calm and comfortable and is in NAD, up in bedside chair Eyes:  normal lids, iris ENT:  grossly normal hearing, lips & tongue, mmm Cardiovascular:  RRR. No LE edema.  Respiratory:   CTA bilaterally with no wheezes/rales/rhonchi.  Normal respiratory effort. Abdomen:  soft, NT, ND; abdominal binder is in place Skin:  no rash or induration seen on limited exam Musculoskeletal:  grossly normal tone BUE/BLE, good ROM, no bony abnormality Psychiatric:  pleasantly confused mood and affect, speech fluent and appropriate, AOx1 Neurologic:  CN 2-12 grossly intact, moves all extremities in coordinated fashion  Data Reviewed: I have reviewed the patient's lab results since admission.  Pertinent labs for today include:   None today     Family Communication: Husband, son, and another family member were present  Mobility: PT/OT Consulted and are recommending - Skilled Nursing-Short Term Rehab (<3 Hours/Day)07/05/2024 1636    Code Status: Limited: Do not attempt resuscitation (DNR) -DNR-LIMITED -Do Not Intubate/DNI   Barriers to discharge:    Disposition: Status is: Inpatient Remains inpatient appropriate because: arranging for home with hospice, likely 9/25     Time spent: 50 minutes  Unresulted Labs (From admission, onward)     Start     Ordered   06/02/24 0830  CBC with Differential  (Undifferentiated presentation (screening labs and basic nursing orders))  ONCE - STAT,   STAT        06/02/24 0830              Author: Delon Herald, MD 07/07/2024 3:22 PM  For on call review [email or web address].

## 2024-07-07 NOTE — Plan of Care (Signed)
  Problem: Clinical Measurements: Goal: Diagnostic test results will improve Outcome: Progressing Goal: Signs and symptoms of infection will decrease Outcome: Progressing   Problem: Clinical Measurements: Goal: Ability to maintain clinical measurements within normal limits will improve Outcome: Progressing   Problem: Coping: Goal: Level of anxiety will decrease Outcome: Progressing   Problem: Safety: Goal: Ability to remain free from injury will improve Outcome: Progressing

## 2024-07-07 NOTE — Telephone Encounter (Unsigned)
 Copied from CRM #[id number]. Topic: General - Other >> Jul 07, 2024  1:27 PM Berneda FALCON wrote: Reason for CRM: Son, [name], is calling to check up on the note left for PCP yesterday about the letter of incapacity for this patient. States she is still in the hospital but is going to be released for hospice this week. The hospital told him the PCP is the one who would do this letter for her since she was in his care the longest.  Supposed to be discharged before the end of week.  [name]-[phone]

## 2024-07-08 ENCOUNTER — Other Ambulatory Visit (HOSPITAL_COMMUNITY): Payer: Self-pay

## 2024-07-08 MED ORDER — QUETIAPINE FUMARATE 50 MG PO TABS
50.0000 mg | ORAL_TABLET | Freq: Every day | ORAL | 0 refills | Status: AC
  Filled 2024-07-08: qty 30, 30d supply, fill #0

## 2024-07-08 MED ORDER — OXYCODONE HCL 5 MG PO TABS
5.0000 mg | ORAL_TABLET | ORAL | 0 refills | Status: AC | PRN
  Filled 2024-07-08: qty 20, 4d supply, fill #0

## 2024-07-08 MED ORDER — GERHARDT'S BUTT CREAM
1.0000 | TOPICAL_CREAM | Freq: Two times a day (BID) | CUTANEOUS | 0 refills | Status: AC
  Filled 2024-07-08: qty 60, 30d supply, fill #0

## 2024-07-08 NOTE — Progress Notes (Signed)
 Discharge medications delivered to patient at bedside in a secure bag.

## 2024-07-08 NOTE — Progress Notes (Signed)
 Discharge instructions given to patients son and legal guardian Lilliah Priego, questions asked and answered.

## 2024-07-08 NOTE — Plan of Care (Signed)

## 2024-07-08 NOTE — Discharge Summary (Signed)
 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 06/09/2024 Active Problems:   S/P small bowel resection   Goals of care, counseling/discussion   Severe early onset Alzheimer dementia (HCC)   COPD with emphysema (HCC)   Thoracic aortic atherosclerosis   History of essential hypertension   PAD (peripheral artery disease)   Nausea and vomiting   Hyperlipidemia   Abnormal EKG   Hypophosphatemia   Hypokalemia   Malnutrition of moderate degree   Small bowel obstruction (HCC)   Palliative care encounter   Counseling and coordination of care   Hospital Course: 73yo with h/o COPD, HLD, RLL lung CA s/p lobectomy, PAD, and dementia who presented on 8/20 with lethargy, found to have staph bacteremia. She complained of abdominal pain and CT showed concern for small bowel ischemia. Surgery performed  exploratory laparotomy with a small bowel resection on 8/27.  Patient has been on TPN, with very poor oral intake.  Repeat CT scan 9/02 with new enhancing fluid collections worrisome for abscesses. IR consulted for drain placement, which occurred on 9/03.  Family has declined PEG and she is weaning off TPN.  Palliative care is following, likely to dc home with hospice on 9/25.  Assessment and Plan:  Sepsis due to small bowel enteritis, resolved Patient initially had leukocytosis with hypotension and lethargy Repeat CT 8/26 showed severely thickened and inflamed central pelvic loops of small bowel concerning for ischemia\ Followed by surgery and taken to the OR on 06/09/2024 for diagnostic laparoscopy, exploratory  laparotomy with small bowel resection Treated with cefepime  and metronidazole  x 14 days Blood cultures negative Repeat CT 9/7 showed new fluid collection RLQ 4.1 x 2.6 cm and persistent central pelvic collection measuring 3.6 x 3.5 cm Underwent IR drain placement which has since been removed Continues to have poor PO intake Was on TPN but this has been weaned off Based on advanced dementia with poor PO intake, after GOC conversations with palliative care the family has decided to take her home with hospice   Metabolic Encephalopathy in the setting of Alzheimer's dementia Presumed worsening in setting of acute infection; son states encephalopathy worsens when she is sick Continue delirium precautions Sundowning has improved with addition of Seroquel    Blood culture contamination 06/01/2024 culture noted with Staph hominis, presumed contaminant No further workup   COPD with emphysema  Continue albuterol  inhaler   Hyperlipidemia PAD (peripheral artery disease)  Thoracic aortic atherosclerosis Holding Lipitor   History of essential hypertension On as needed IV hydralazine    Weakness, deconditioning Seen by PT/OT and recommended skilled nursing facility placement  After ongoing GOC conversation with palliative care assistance, family has instead decided to take her home with hospice   Dysphagia  s/p SLP eval Continue regular diet   Nutrition Status: Nutrition Problem: Moderate Malnutrition Etiology: chronic illness (dementia) Signs/Symptoms: moderate fat depletion, severe muscle depletion Interventions: TPN, Ensure Enlive (each supplement provides 350kcal and 20 grams of protein), Boost Breeze   DNR DNR confirmed at the time of admission [facility] documents reviewed Patient will need a gold out of facility DNR form at the time of discharge  Consultants: Surgery IR Palliative care PT OT SLP Nutrition TOC team   Procedures: Ex lap with small bowel resection  8/27 CT-guided drain placement 9/3   Antibiotics: Cefepime  8/20-9/3 Metronidazole  8/20-9/3 Vancomycin  x 3 doses     Disposition: Home with hospice Diet recommendation:  Regular diet DISCHARGE MEDICATION: Allergies as of 07/08/2024       Reactions   Donepezil  Nausea And Vomiting   Penicillins Swelling   REACTION: swelling tongue - States has taken Cephalosporins without difficulty in past        Medication List     STOP taking these medications    atorvastatin  20 MG tablet Commonly known as: LIPITOR       TAKE these medications    albuterol  108 (90 Base) MCG/ACT inhaler Commonly known as: VENTOLIN  HFA Inhale 2 puffs into the lungs every 6 (six) hours as needed for wheezing or shortness of breath.   b complex vitamins capsule Take 1 capsule by mouth daily.   famotidine  20 MG tablet Commonly known as: Pepcid  Take 1 tablet (20 mg total) by mouth daily as needed for heartburn or indigestion.   Gerhardt's butt cream Crea Apply 1 Application topically 2 (two) times daily.   multivitamin tablet Take 1 tablet by mouth daily.   nortriptyline  25 MG capsule Commonly known as: Pamelor  Take 1 capsule (25 mg total) by mouth at bedtime.   oxyCODONE  5 MG immediate release tablet Commonly known as: Oxy IR/ROXICODONE  Take 1 tablet (5 mg total) by mouth every 4 (four) hours as needed for severe pain (pain score 7-10).   QUEtiapine  50 MG tablet Commonly known as: SEROQUEL  Take 1 tablet (50 mg total) by mouth daily before supper.   rivastigmine  9.5 mg/24hr Commonly known as: EXELON  PLACE 1 PATCH (9.5 MG TOTAL) ONTO THE SKIN DAILY.   traZODone  50 MG tablet Commonly known as: DESYREL  Take 50 mg by mouth at bedtime as needed for sleep.               Discharge Care Instructions  (From admission, onward)           Start     Ordered   07/08/24 0000  Change dressing (specify)       Comments: Dressing change: 1 time per day   07/08/24 1035             Follow-up Information     Curvin Mt III, MD Follow up on 07/29/2024.   Specialty: General Surgery Why: at 10:40 AM for post-operative follow up. Please arrive by 10:10 AM to get checked in and fill out any necessary paperwork. Contact information: [address] [place] [address] [phone]                Discharge Exam:  Subjective: Pleasant.  She is so excited to be going home today!   Objective: Vitals:   07/07/24 2013 07/08/24 0449  BP: 115/74 126/83  Pulse: 93 84  Resp: 20 19  Temp: 98.4 F (36.9 C) 97.6 F (36.4 C)  SpO2: 100% 100%    Intake/Output Summary (Last 24 hours) at 07/08/2024 1035 Last data filed at 07/07/2024 1851 Gross per 24 hour  Intake 250 ml  Output --  Net 250 ml   Filed Weights   07/06/24 0500 07/07/24 0559 07/08/24 0453  Weight: 66.4 kg 65 kg 67.6 kg    Exam:  General:  Appears calm and comfortable and is in NAD Eyes:  normal lids, iris ENT:  grossly  normal hearing, lips & tongue, mmm Cardiovascular:  RRR. No LE edema.  Respiratory:   CTA bilaterally with no wheezes/rales/rhonchi.  Normal respiratory effort. Abdomen:  soft, NT, ND; midline abdominal incision is bandaged, C/D/I Skin:  no rash or induration seen on limited exam Musculoskeletal:  grossly normal tone BUE/BLE, good ROM, no bony abnormality Psychiatric:  pleasantly confused mood and affect, speech fluent and appropriate, AOx1 Neurologic:  CN 2-12 grossly intact, moves all extremities in coordinated fashion  Data Reviewed: I have reviewed the patient's lab results since admission.  Pertinent labs for today include:   None    Condition at discharge: fair  The results of significant diagnostics from this hospitalization (including imaging, microbiology, ancillary and laboratory) are listed below for reference.   Imaging Studies: CT ABDOMEN PELVIS W CONTRAST Result Date: 06/20/2024 CLINICAL DATA:  Follow-up right lower quadrant drain  placement EXAM: CT ABDOMEN AND PELVIS WITH CONTRAST TECHNIQUE: Multidetector CT imaging of the abdomen and pelvis was performed using the standard protocol following bolus administration of intravenous contrast. RADIATION DOSE REDUCTION: This exam was performed according to the departmental dose-optimization program which includes automated exposure control, adjustment of the mA and/or kV according to patient size and/or use of iterative reconstruction technique. CONTRAST:  OMNIPAQUE  IOHEXOL  300 MG/ML  SOLN COMPARISON:  None Available. FINDINGS: Lower chest: Ground-glass opacity dependently in the left lower lobe is similar to prior study and likely reflects atelectasis or scarring. No effusions. Hepatobiliary: No focal hepatic abnormality. Gallbladder unremarkable. Pancreas: No focal abnormality or ductal dilatation. Spleen: No focal abnormality.  Normal size. Adrenals/Urinary Tract: Diffuse left adrenal enlargement, stable. No hydronephrosis. Small amount of air within the urinary bladder, presumably related to recent catheterization. Stomach/Bowel: Sigmoid diverticulosis. No active diverticulitis. Stomach and small bowel decompressed, unremarkable. Vascular/Lymphatic: Diffuse aortic atherosclerosis. No evidence of aneurysm or adenopathy. Reproductive: No visible focal abnormality. Other: Right lower quadrant drain in place in the area of previous right paracolic gutter fluid collection. No measurable fluid now seen around the catheter. Inferior in the right lower quadrant is a 2nd fluid collection measuring 4.1 x 2.6 cm. It is unclear if this communicates with the larger drained right paracolic gutter fluid collection, but is persistence suggests these do not communicate. Central pelvic fluid collection again noted measuring 3.6 x 3.5 cm, decreased in size since prior study. Musculoskeletal: No acute bony abnormality. Diffuse degenerative disc and facet disease. IMPRESSION: Right paracolic gutter fluid  collection has been drain with pigtail drainage catheter in place. No measurable fluid in the region. More inferiorly in the right lower quadrant is a 4.1 x 2.6 cm fluid collection which likely does not communicate with the other fluid collection drained by the drainage catheter. Persistent central pelvic fluid collection measuring 3.6 x 3.5 cm, decreased since prior study. Air within the urinary bladder, presumably from recent instrumentation. If there has not been catheterization recently, this would be concerning for fistula. Aortic atherosclerosis. Sigmoid diverticulosis. Electronically Signed   By: Franky Crease M.D.   On: 06/20/2024 17:20   CT GUIDED PERITONEAL/RETROPERITONEAL FLUID DRAIN BY PERC CATH Result Date: 06/17/2024 INDICATION: Right lower abscess. EXAM: CT-guided abscess drainage placement TECHNIQUE: Multidetector CT imaging of the abdomen was performed following the standard protocol without IV contrast. RADIATION DOSE REDUCTION: This exam was performed according to the departmental dose-optimization program which includes automated exposure control, adjustment of the mA and/or kV according to patient size and/or use of iterative reconstruction technique. MEDICATIONS: The patient is currently admitted to the hospital and  receiving intravenous antibiotics. The antibiotics were administered within an appropriate time frame prior to the initiation of the procedure. ANESTHESIA/SEDATION: Moderate (conscious) sedation was employed during this procedure. A total of Versed  2 mg and Fentanyl  1 mcg was administered intravenously by the radiology nurse. Total intra-service moderate Sedation Time: 12 minutes. The patient's level of consciousness and vital signs were monitored continuously by radiology nursing throughout the procedure under my direct supervision. COMPLICATIONS: None immediate. PROCEDURE: Informed written consent was obtained from the patient after a thorough discussion of the procedural risks,  benefits and alternatives. All questions were addressed. Maximal Sterile Barrier Technique was utilized including caps, mask, sterile gowns, sterile gloves, sterile drape, hand hygiene and skin antiseptic. A timeout was performed prior to the initiation of the procedure. In a supine position the was scanned and images were. Radiopaque markers were placed in the right lower quadrant and further axial imaging was performed. Measurements were obtained. The patient's skin was then marked, prepped, and draped in the usual sterile fashion. Local anesthesia was achieved with 1% lidocaine . A 7 cm Yueh then advanced through a small incision directed towards the abscess. Repeat imaging was performed and the needle was redirected. Further imaging was then performed demonstrating the needle to be within the abscess cavity. The needle was removed leaving the sheath behind in the short Amplatz wire was then coiled within the abscess. Repeat imaging demonstrates the guidewire in satisfactory position. The access site was dilated 10 French fascial dilator. Ten French pigtail catheter was then over the guidewire and coiled within the abscess. Retention suture and sterile dressing applied. The catheter was connected to a JP bulb. A small sample obtained sent to pathology for culture sensitivity. IMPRESSION: Satisfactory right lower quadrant 10 French abscess drainage catheter placed. Fluid was obtained and sent to laboratory for culture and sensitivity Electronically Signed   By: Cordella Banner   On: 06/17/2024 13:16   CT ABDOMEN PELVIS W CONTRAST Result Date: 06/15/2024 CLINICAL DATA:  Complicated diverticulitis suspected. History of right lower lobe adenocarcinoma. EXAM: CT ABDOMEN AND PELVIS WITH CONTRAST TECHNIQUE: Multidetector CT imaging of the abdomen and pelvis was performed using the standard protocol following bolus administration of intravenous contrast. RADIATION DOSE REDUCTION: This exam was performed according to  the departmental dose-optimization program which includes automated exposure control, adjustment of the mA and/or kV according to patient size and/or use of iterative reconstruction technique. CONTRAST:  OMNIPAQUE  IOHEXOL  300 MG/ML  SOLN COMPARISON:  CT abdomen and pelvis 06/08/2024. FINDINGS: Lower chest: Pleural effusions have resolved. There is minimal scarring or atelectasis in the left lung base. Hepatobiliary: There some fluid and stranding surrounding the gallbladder. There is no biliary ductal dilatation. The liver appear stable. There is a subcentimeter cyst or hemangioma in the peripheral right lobe. No new liver lesions are seen. Pancreas: Unremarkable. No pancreatic ductal dilatation or surrounding inflammatory changes. Spleen: Normal in size without focal abnormality. Adrenals/Urinary Tract: Small left renal cysts appear unchanged. Otherwise, the kidneys and adrenal glands are within normal limits. There is diffuse bladder wall thickening. There is a small amount of air in the bladder. Stomach/Bowel: Patient is status post interval abdominal surgery. There is a small amount of free air in the upper abdomen likely related to recent surgery. Stomach is decompressed. There are dilated small bowel loops with air-fluid levels in the upper abdomen measuring up to 3.6 cm. Again seen are clustered small bowel loops in the pelvis with wall thickening and mesenteric edema similar to prior study.  There is a small amount of air within the mesentery at this level which is new from prior. The colon is nondilated. The appendix is not seen. Vascular/Lymphatic: Aortic atherosclerosis. No enlarged abdominal or pelvic lymph nodes. Reproductive: Uterus is unremarkable. Ovaries are not well delineated on this study. Other: New enhancing fluid collection seen in the right paracolic gutter measuring 5.8 x 2.4 by 6.0 cm. New multiloculated enhancing fluid collection seen in the central pelvis distal above the bladder  measuring 3.5 x 4.8 x 3.0 cm. There is a small amount of simple free fluid in the right upper quadrant and left upper quadrant. Midline abdominal wall wound is present. There is diffuse body wall edema. There is no focal hernia. Musculoskeletal: The bones are osteopenic. There is dextroconvex curvature of the lumbar spine with multilevel degenerative change. IMPRESSION: 1. Interval abdominal surgery. There is a small amount of free air in the upper abdomen likely related to recent surgery. 2. New enhancing fluid collections in the right paracolic gutter and central pelvis worrisome for abscesses. 3. Persistent clustered small bowel loops in the pelvis with wall thickening and mesenteric edema. There is a small amount of air within the mesentery at this level which is new from prior. 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 4. Dilated small bowel loops in the upper abdomen worrisome for small bowel obstruction. 5. Diffuse bladder wall thickening with small amount of air in the bladder. Findings may be related to recent instrumentation or infection. 6. Small amount of simple free fluid in the right upper quadrant and left upper quadrant. 7. Body wall edema. 8. Aortic atherosclerosis. Aortic Atherosclerosis (ICD10-I70.0). Electronically Signed   By: Greig Pique M.D.   On: 06/15/2024 23:44   DG CHEST PORT 1 VIEW Result Date: 06/15/2024 EXAM: 1 VIEW XRAY OF THE CHEST 06/15/2024 02:47:00 PM COMPARISON: 05/31/2024 CLINICAL HISTORY: Leukocytosis FINDINGS: LUNGS AND PLEURA: Low lung volumes. Left basilar patchy opacities. No pleural effusion. No pneumothorax. HEART AND MEDIASTINUM: No acute abnormality of the cardiac and mediastinal silhouettes. BONES AND SOFT TISSUES: No acute osseous abnormality. LINES AND TUBES: Right PICC with tip at superior cavoatrial junction. IMPRESSION: 1. Left basilar patchy opacities, possibly developing consolidation. 2. Low lung volumes. Electronically signed by: Franky Stanford MD 06/15/2024  06:31 PM EDT RP Workstation: [id number]   CT ABDOMEN PELVIS W CONTRAST Result Date: 06/08/2024 CLINICAL DATA:  Abdominal pain and distension. Dilated small bowel on KUB 06/08/2024 raising concern for small bowel obstruction. EXAM: CT ABDOMEN AND PELVIS WITH CONTRAST TECHNIQUE: Multidetector CT imaging of the abdomen and pelvis was performed using the standard protocol following bolus administration of intravenous contrast. RADIATION DOSE REDUCTION: This exam was performed according to the departmental dose-optimization program which includes automated exposure control, adjustment of the mA and/or kV according to patient size and/or use of iterative reconstruction technique. CONTRAST:  OMNIPAQUE  IOHEXOL  300 MG/ML  SOLN COMPARISON:  06/01/2024 abdominal radiograph 06/08/2024 FINDINGS: Lower chest: New small bilateral pleural effusions and associated passive atelectasis. New mild cardiomegaly. A nasogastric tube extends in the distal esophagus and into the stomach body. Hepatobiliary: Small subcapsular cyst in segment 8 on image 12 series 12, similar to previous. Contracted gallbladder. Pancreas: Unremarkable Spleen: Unremarkable Adrenals/Urinary Tract: Stable fullness of the adrenal glands, especially along the left side, without a discrete mass. Bosniak category 1 cyst of the left kidney lower pole. Urinary bladder unremarkable. Stomach/Bowel: Nasogastric tube terminates in the stomach body. Sigmoid colon diverticulosis. Contrast medium in normal appendix. Complex appearance of severely thickened loops  of central pelvic small bowel with indistinct bowel wall margins, surrounding edema, dilated small bowel extending towards these loops, and at least some sense of twisting of bowel for example in the central abdomen on images 52-56 of series 2 along adjacent branches of the superior mesenteric vein concerning for mesenteric volvulus or closed loop obstruction. No definite pneumatosis or extraluminal gas. The  indistinctness of bowel walls makes this region difficult to assess, and the possibility of intussusception is also not completely excluded. There is some small loculations of gas for example near the left inguinal ring potentially along the thickened bowel which are presumably intraluminal but could be contained tiny amounts of extraluminal gas. Vascular/Lymphatic: Patent celiac trunk and SMA. Aortoiliac atherosclerosis. Reproductive: Unremarkable fluid density lesion along the right adnexa 4.5 by 3.1 cm with single internal septation, this could be due to hydrosalpinx or ovarian cysts. This has been present and not hypermetabolic on prior PET-CT. Other: Mild ascites.  Presacral edema.  Mild mesenteric edema. Musculoskeletal: Dextroconvex thoracolumbar scoliosis with substantial rotary component. Healed left posterior rib fractures. Mild deformity of the left pubic bone potentially from old fracture. Bone island in the left sacrum. IMPRESSION: 1. Severely thickened and inflamed central pelvic loops of small bowel concerning for ischemia. Complex appearance due to indistinct bowel wall margins, surrounding edema, dilated small bowel extending towards these loops, and at least some sense of twisting of bowel along adjacent branches of the superior mesenteric vein concerning for mesenteric volvulus or closed loop obstruction. No definite pneumatosis. Small locules of gas along the left inguinal ring and along the inflamed loops of bowel are presumably intraluminal but could conceivably represent contained extraluminal gas from local microperforation. 2. New small bilateral pleural effusions and associated passive atelectasis. 3. New mild cardiomegaly. 4. Mild ascites. 5. Sigmoid colon diverticulosis. 6. Stable fluid density lesion along the right adnexa with single internal septation, this could be due to hydrosalpinx or ovarian cysts. This has been present and not hypermetabolic on prior PET-CT. 7. Dextroconvex  thoracolumbar scoliosis with substantial rotary component. 8.  Aortic Atherosclerosis (ICD10-I70.0). Electronically Signed   By: Ryan Salvage M.D.   On: 06/08/2024 17:45    Microbiology: Results for orders placed or performed during the hospital encounter of 06/02/24  Blood Culture (routine x 2)     Status: None   Collection Time: 06/02/24  8:35 AM   Specimen: BLOOD  Result Value Ref Range Status   Specimen Description   Final    BLOOD RIGHT ANTECUBITAL Performed at [facility], [address], [place], [place] [address]    Special Requests   Final    BOTTLES DRAWN AEROBIC AND ANAEROBIC Blood Culture adequate volume Performed at [facility], [address], [place], [place] [address]    Culture   Final    NO GROWTH 5 DAYS Performed at [facility], [address] [place], [place] [address]    Report Status 06/07/2024 FINAL  Final  Resp panel by RT-PCR (RSV, Flu A&B, Covid) Anterior Nasal Swab     Status: None   Collection Time: 06/02/24 10:14 AM   Specimen: Anterior Nasal Swab  Result Value Ref Range Status   SARS Coronavirus 2 by RT PCR NEGATIVE NEGATIVE Final    Comment: (NOTE) SARS-CoV-2 target nucleic acids are NOT DETECTED.  The SARS-CoV-2 RNA is generally detectable in upper respiratory specimens during the acute phase of infection. The lowest concentration of SARS-CoV-2 viral copies this assay can detect is  138 copies/mL. A negative result does not preclude SARS-Cov-2 infection and should not be used as the sole basis for treatment or other patient management decisions. A negative result may occur with  improper specimen collection/handling, submission of specimen other than nasopharyngeal swab, presence of viral mutation(s) within the areas targeted by this assay, and inadequate number of viral copies(<138 copies/mL). A negative result must be combined with clinical observations, patient history, and  epidemiological information. The expected result is Negative.  Fact Sheet for Patients:  BloggerCourse.com  Fact Sheet for Healthcare Providers:  SeriousBroker.it  This test is no t yet approved or cleared by the [place]  FDA and  has been authorized for detection and/or diagnosis of SARS-CoV-2 by FDA under an Emergency Use Authorization (EUA). This EUA will remain  in effect (meaning this test can be used) for the duration of the COVID-19 declaration under Section 564(b)(1) of the Act, 21 U.S.C.section 360bbb-3(b)(1), unless the authorization is terminated  or revoked sooner.       Influenza A by PCR NEGATIVE NEGATIVE Final   Influenza B by PCR NEGATIVE NEGATIVE Final    Comment: (NOTE) The Xpert Xpress SARS-CoV-2/FLU/RSV plus assay is intended as an aid in the diagnosis of influenza from Nasopharyngeal swab specimens and should not be used as a sole basis for treatment. Nasal washings and aspirates are unacceptable for Xpert Xpress SARS-CoV-2/FLU/RSV testing.  Fact Sheet for Patients: BloggerCourse.com  Fact Sheet for Healthcare Providers: SeriousBroker.it  This test is not yet approved or cleared by the [place]  FDA and has been authorized for detection and/or diagnosis of SARS-CoV-2 by FDA under an Emergency Use Authorization (EUA). This EUA will remain in effect (meaning this test can be used) for the duration of the COVID-19 declaration under Section 564(b)(1) of the Act, 21 U.S.C. section 360bbb-3(b)(1), unless the authorization is terminated or revoked.     Resp Syncytial Virus by PCR NEGATIVE NEGATIVE Final    Comment: (NOTE) Fact Sheet for Patients: BloggerCourse.com  Fact Sheet for Healthcare Providers: SeriousBroker.it  This test is not yet approved or cleared by the [place]  FDA and has been  authorized for detection and/or diagnosis of SARS-CoV-2 by FDA under an Emergency Use Authorization (EUA). This EUA will remain in effect (meaning this test can be used) for the duration of the COVID-19 declaration under Section 564(b)(1) of the Act, 21 U.S.C. section 360bbb-3(b)(1), unless the authorization is terminated or revoked.  Performed at [facility], [address], [place], [place] [address]   Blood Culture (routine x 2)     Status: None   Collection Time: 06/02/24 12:45 PM   Specimen: BLOOD  Result Value Ref Range Status   Specimen Description   Final    BLOOD BLOOD LEFT ARM Performed at [facility], [address], [place], [place] [address]    Special Requests   Final    BOTTLES DRAWN AEROBIC ONLY Blood Culture adequate volume Performed at [facility] [address] [place], [place] [address]    Culture   Final    NO GROWTH 5 DAYS Performed at [facility], [address] [place], [place] [address]    Report Status 06/07/2024 FINAL  Final  Urine Culture (for pregnant, neutropenic or urologic patients or patients with an indwelling urinary catheter)     Status: Abnormal   Collection Time: 06/15/24  6:18 PM   Specimen: Urine, Clean Catch  Result Value Ref Range Status   Specimen  Description   Final    URINE, CLEAN CATCH Performed at [facility], [address], [place], [place] [address]    Special Requests   Final    NONE Performed at [facility], [address], [place], [place] [address]    Culture 20,000 COLONIES/mL ENTEROCOCCUS FAECIUM (A)  Final   Report Status 06/18/2024 FINAL  Final   Organism ID, Bacteria ENTEROCOCCUS FAECIUM (A)  Final      Susceptibility   Enterococcus faecium - MIC*    AMPICILLIN <=2 SENSITIVE Sensitive     NITROFURANTOIN  32 SENSITIVE Sensitive     VANCOMYCIN  <=0.5 SENSITIVE Sensitive     * 20,000 COLONIES/mL ENTEROCOCCUS  FAECIUM  MRSA Next Gen by PCR, Nasal     Status: None   Collection Time: 06/16/24  7:43 AM   Specimen: Nasal Mucosa; Nasal Swab  Result Value Ref Range Status   MRSA by PCR Next Gen NOT DETECTED NOT DETECTED Final    Comment: (NOTE) The GeneXpert MRSA Assay (FDA approved for NASAL specimens only), is one component of a comprehensive MRSA colonization surveillance program. It is not intended to diagnose MRSA infection nor to guide or monitor treatment for MRSA infections. Test performance is not FDA approved in patients less than 12 years old. Performed at [facility], [address], [place], [place] [address]   Aerobic/Anaerobic Culture w Gram Stain (surgical/deep wound)     Status: None   Collection Time: 06/16/24  5:14 PM   Specimen: Abscess  Result Value Ref Range Status   Specimen Description   Final    ABSCESS Performed at [facility], [address] [place], [place] [address]    Special Requests   Final    ABDOMEN Performed at [facility], [address] [place], [place] [address]    Gram Stain   Final    ABUNDANT WBC PRESENT, PREDOMINANTLY PMN NO ORGANISMS SEEN    Culture   Final    No growth aerobically or anaerobically. Performed at [facility], [address] [place], [place] [address]    Report Status 06/21/2024 FINAL  Final  SARS Coronavirus 2 by RT PCR (hospital order, performed in [facility] hospital lab) *cepheid single result test* Anterior Nasal Swab     Status: None   Collection Time: 06/29/24  4:29 PM   Specimen: Anterior Nasal Swab  Result Value Ref Range Status   SARS Coronavirus 2 by RT PCR NEGATIVE NEGATIVE Final    Comment: (NOTE) SARS-CoV-2 target nucleic acids are NOT DETECTED.  The SARS-CoV-2 RNA is generally detectable in upper and lower respiratory specimens during the acute phase of infection. The lowest concentration of SARS-CoV-2 viral copies this assay can detect is  250 copies / mL. A negative result does not preclude SARS-CoV-2 infection and should not be used as the sole basis for treatment or other patient management decisions.  A negative result may occur with improper specimen collection / handling, submission of specimen other than nasopharyngeal swab, presence of viral mutation(s) within the areas targeted by this assay, and inadequate number of viral copies (<250 copies / mL). A negative result must be combined with clinical observations, patient history, and epidemiological information.  Fact Sheet for Patients:   RoadLapTop.co.za  Fact Sheet for Healthcare Providers: http://kim-miller.com/  This test is not yet approved or  cleared by the [place]  FDA and has been authorized for detection and/or diagnosis of SARS-CoV-2 by  FDA under an Emergency Use Authorization (EUA).  This EUA will remain in effect (meaning this test can be used) for the duration of the COVID-19 declaration under Section 564(b)(1) of the Act, 21 U.S.C. section 360bbb-3(b)(1), unless the authorization is terminated or revoked sooner.  Performed at [facility], [address], [place], [place] [address]   Resp panel by RT-PCR (RSV, Flu A&B, Covid) Anterior Nasal Swab     Status: None   Collection Time: 07/05/24 11:36 AM   Specimen: Anterior Nasal Swab  Result Value Ref Range Status   SARS Coronavirus 2 by RT PCR NEGATIVE NEGATIVE Final    Comment: (NOTE) SARS-CoV-2 target nucleic acids are NOT DETECTED.  The SARS-CoV-2 RNA is generally detectable in upper respiratory specimens during the acute phase of infection. The lowest concentration of SARS-CoV-2 viral copies this assay can detect is 138 copies/mL. A negative result does not preclude SARS-Cov-2 infection and should not be used as the sole basis for treatment or other patient management decisions. A negative result may occur with  improper  specimen collection/handling, submission of specimen other than nasopharyngeal swab, presence of viral mutation(s) within the areas targeted by this assay, and inadequate number of viral copies(<138 copies/mL). A negative result must be combined with clinical observations, patient history, and epidemiological information. The expected result is Negative.  Fact Sheet for Patients:  BloggerCourse.com  Fact Sheet for Healthcare Providers:  SeriousBroker.it  This test is no t yet approved or cleared by the [place]  FDA and  has been authorized for detection and/or diagnosis of SARS-CoV-2 by FDA under an Emergency Use Authorization (EUA). This EUA will remain  in effect (meaning this test can be used) for the duration of the COVID-19 declaration under Section 564(b)(1) of the Act, 21 U.S.C.section 360bbb-3(b)(1), unless the authorization is terminated  or revoked sooner.       Influenza A by PCR NEGATIVE NEGATIVE Final   Influenza B by PCR NEGATIVE NEGATIVE Final    Comment: (NOTE) The Xpert Xpress SARS-CoV-2/FLU/RSV plus assay is intended as an aid in the diagnosis of influenza from Nasopharyngeal swab specimens and should not be used as a sole basis for treatment. Nasal washings and aspirates are unacceptable for Xpert Xpress SARS-CoV-2/FLU/RSV testing.  Fact Sheet for Patients: BloggerCourse.com  Fact Sheet for Healthcare Providers: SeriousBroker.it  This test is not yet approved or cleared by the [place]  FDA and has been authorized for detection and/or diagnosis of SARS-CoV-2 by FDA under an Emergency Use Authorization (EUA). This EUA will remain in effect (meaning this test can be used) for the duration of the COVID-19 declaration under Section 564(b)(1) of the Act, 21 U.S.C. section 360bbb-3(b)(1), unless the authorization is terminated or revoked.     Resp  Syncytial Virus by PCR NEGATIVE NEGATIVE Final    Comment: (NOTE) Fact Sheet for Patients: BloggerCourse.com  Fact Sheet for Healthcare Providers: SeriousBroker.it  This test is not yet approved or cleared by the [place]  FDA and has been authorized for detection and/or diagnosis of SARS-CoV-2 by FDA under an Emergency Use Authorization (EUA). This EUA will remain in effect (meaning this test can be used) for the duration of the COVID-19 declaration under Section 564(b)(1) of the Act, 21 U.S.C. section 360bbb-3(b)(1), unless the authorization is terminated or revoked.  Performed at [facility], [address], [place], [place] [address]     Labs: CBC: Recent Labs  Lab 07/04/24 0321 07/05/24 0304  WBC 4.6 5.8  NEUTROABS 2.6  3.6  HGB 9.2* 9.1*  HCT 29.3* 30.2*  MCV 94.2 93.8  PLT 295 303   Basic Metabolic Panel: Recent Labs  Lab 07/04/24 0321 07/05/24 0304  NA 140 141  K 3.9 3.8  CL 110 109  CO2 20* 20*  GLUCOSE 98 106*  BUN 26* 20  CREATININE 0.51 0.51  CALCIUM  8.7* 8.9  MG  --  2.2  PHOS  --  2.9   Liver Function Tests: Recent Labs  Lab 07/05/24 0304  AST 21  ALT 24  ALKPHOS 104  BILITOT 0.4  PROT 5.9*  ALBUMIN  3.4*   CBG: Recent Labs  Lab 07/06/24 1732 07/06/24 2356 07/07/24 0601 07/07/24 1222 07/07/24 2335  GLUCAP 80 98 86 95 94    Discharge time spent: greater than 30 minutes.  Signed: Delon Herald, MD [facility] 07/08/2024

## 2024-07-08 NOTE — Care Management Important Message (Signed)
 Important Message  Patient Details IM Letter not given due to Discharging with Hospice Name: Lindsay Mason MRN: [id number] Date of Birth: 07/13/1951   Important Message Given:        Lindsay Mason 07/08/2024, 12:21 PM

## 2024-07-08 NOTE — Telephone Encounter (Signed)
 Family member picked up ppwk

## 2024-07-08 NOTE — Telephone Encounter (Signed)
Letter at reception for pickup

## 2024-07-08 NOTE — TOC Transition Note (Signed)
 Transition of Care [facility]) - Discharge Note   Patient Details  Name: Lindsay Mason MRN: [id number] Date of Birth: [birth year]  Transition of Care [facility]) CM/SW Contact:  Sonda Manuella Quill, RN Phone Number: 07/08/2024, 11:20 AM   Clinical Narrative:    D/C orders received; pt will d/c home w/ [facility] hosp[facility]; Inocente Jacobs, [facility] hospital liaison notified that pt's travel tank has not been delivered; she said pt's son [name] will coordinate delivery of transport oxygen to pt's hospital room; spoke w/ pt and family in room; family will provide transportation; they agree to d/c plan; agency contact info placed in follow up provider section of d/c instructions; no TOC needs.   Final next level of care: Home w Hospice Care Barriers to Discharge: No Barriers Identified   Patient Goals and CMS Choice Patient states their goals for this hospitalization and ongoing recovery are:: ST SNF CMS Medicare.gov Compare Post Acute Care list provided to:: Patient Represenative (must comment) ([name](dtr)) Choice offered to / list presented to : Adult Children [place] ownership interest in [facility].provided to:: Adult Children    Discharge Placement                       Discharge Plan and Services Additional resources added to the After Visit Summary for   In-house Referral: Clinical Social Work Discharge Planning Services: CM Consult Post Acute Care Choice: Skilled Nursing Facility          DME Arranged: N/A DME Agency: NA       HH Arranged: NA HH Agency: NA        Social Drivers of Health (SDOH) Interventions SDOH Screenings   Food Insecurity: No Food Insecurity (06/02/2024)  Housing: Low Risk  (06/02/2024)  Transportation Needs: No Transportation Needs (06/02/2024)  Utilities: Not At Risk (06/02/2024)  Alcohol Screen: Low Risk  (05/05/2024)  Depression (PHQ2-9): High Risk (05/07/2024)  Financial Resource Strain: Low Risk  (05/05/2024)  Physical Activity: Sufficiently  Active (05/05/2024)  Social Connections: Moderately Isolated (06/02/2024)  Stress: No Stress Concern Present (05/05/2024)  Tobacco Use: Medium Risk (06/09/2024)  Health Literacy: Inadequate Health Literacy (05/05/2024)     Readmission Risk Interventions    06/14/2024    2:25 PM 01/11/2024   10:01 AM  Readmission Risk Prevention Plan  Post Dischage Appt  Complete  Medication Screening  Complete  Transportation Screening Complete Complete  Medication Review Oceanographer) Complete   HRI or Home Care Consult Complete   SW Recovery Care/Counseling Consult Complete   Palliative Care Screening Not Applicable   Skilled Nursing Facility Complete

## 2024-07-09 ENCOUNTER — Telehealth: Payer: Self-pay

## 2024-07-09 NOTE — Transitions of Care (Post Inpatient/ED Visit) (Signed)
   07/09/2024  Name: Lindsay Mason MRN: [id number] DOB: 25-Sep-1951  RN made outreach to patient/family and confirmed that hospice services are in place.  Patient/family aware to contact hospice 24/7 for ay questions or concerns. No further interventions at this time.

## 2024-07-09 NOTE — Transitions of Care (Post Inpatient/ED Visit) (Signed)
   07/09/2024  Name: Lindsay Mason MRN: [id number] DOB: 1951/03/06  Today's TOC FU Call Status: Today's TOC FU Call Status:: Successful TOC FU Call Completed TOC FU Call Complete Date: 07/09/24 Patient's Name and Date of Birth confirmed.  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 How have you been since you were released from the hospital?: Better Any questions or concerns?: No  Items Reviewed: Did you receive and understand the discharge instructions provided?: Yes Medications obtained,verified, and reconciled?: No (Hospice is coming out and we have everything) Medications Not Reviewed Reasons:: Other: (Hospice confirmed) Any new allergies since your discharge?: No Dietary orders reviewed?: NA Do you have support at home?: Yes People in Home [RPT]: child(ren), adult, spouse  Spoke with son [name] on [place] appreciative for call and care but states Hospice is all set up and we have what we need, she's doing better since being home, thanks for the call.  Medications Reviewed Today: Home with Hospice - confirmed Medications Reviewed Today   Medications were not reviewed in this encounter     Home Care and Equipment/Supplies: Were Home Health Services Ordered?: Yes Name of Home Health Agency:: [facility] Has Agency set up a time to come to your home?: Yes First Home Health Visit Date: 07/10/24 Any new equipment or medical supplies ordered?: No (We have pretty much everything here) Were you able to get the equipment/medical supplies?: Yes (Everything's here) Do you have any questions related to the use of the equipment/supplies?: No   Richerd Fish, RN, Scientist, research (physical sciences), CCM [employer], [facility] RN Care Manager Direct Dial:  [phone]

## 2024-07-12 MED ORDER — NURTEC 75 MG PO TBDP: 75.0000 mg | ORAL_TABLET | ORAL | 2 refills | Status: AC

## 2024-07-12 NOTE — Telephone Encounter (Signed)
 This patient presented with advanced dementia in May 2025, Dr Nelda referred her.  She had a pneumothorax in 4/ 2025 , just the months prior to my visit with her, and reported daily headache with nausea and vision changes, which most likely reflected migraines, see ROS :   I offered Nurtec samples for relief as I hesitated to give a triptan after significant vascular/ pulmonary/ cardiac events.   Pneumothorax should be a Contraindication.  PCP to follow up for dementia, headaches/ migraines.   Dedra Gores, MD

## 2024-07-12 NOTE — Telephone Encounter (Signed)
 I have received a form via fax for a PA for Nurtec as well as pa requests via CMM-please see previous note below and please advise.

## 2024-07-12 NOTE — Addendum Note (Signed)
 Addended by: CHALICE SAUNAS on: 07/12/2024 04:33 PM   Modules accepted: Orders

## 2024-07-14 ENCOUNTER — Telehealth: Payer: Self-pay

## 2024-07-14 ENCOUNTER — Other Ambulatory Visit (HOSPITAL_COMMUNITY): Payer: Self-pay

## 2024-07-14 DIAGNOSIS — K5700 Diverticulitis of small intestine with perforation and abscess without bleeding: Secondary | ICD-10-CM | POA: Insufficient documentation

## 2024-07-14 DIAGNOSIS — I25119 Atherosclerotic heart disease of native coronary artery with unspecified angina pectoris: Secondary | ICD-10-CM | POA: Insufficient documentation

## 2024-07-14 NOTE — Telephone Encounter (Signed)
 Hello Prescriber!  We are in the process of submitting a prior authorization for your patient for Nurtec. We are reaching out for clinical guidance for the following information to complete the request: Plan requires documentation of FDA approved diagnosis code for the prescribed medication. Please add to patient's problem list or update patient's recent OV for future PA needs. Thanks!   Thank you! Pharmacy Team

## 2024-07-14 NOTE — Telephone Encounter (Signed)
 Vascular headaches in a patient with advanced dementia:  I prescribed Nurtec :  I try to avoid Triptans because of :   Peripheral artery disease,  ( added ICD code to addended Consult note from May 2025):  aortic calcification,  perforated duodenum lung cancer and pneumothorax.

## 2024-07-14 NOTE — Telephone Encounter (Signed)
 Pharmacy Patient Advocate Encounter  Received notification from [facility] that Prior Authorization for NUrtec has been APPROVED from 07/14/2024 to 10/12/2024   PA #/Case ID/Reference #: [id number]  Sent PT a Mychart, Plan will only cover 16 tablets as a 30 DS.

## 2024-07-14 NOTE — Telephone Encounter (Signed)
 PA request has been Submitted. New Encounter has been or will be created for follow up. For additional info see Pharmacy Prior Auth telephone encounter from 07/14/2024.

## 2024-07-14 NOTE — Telephone Encounter (Signed)
 Pharmacy Patient Advocate Encounter   Received notification from Physician's Office that prior authorization for Nurtec 75mg  Tablet is required/requested.   Insurance verification completed.   The patient is insured through [facility].   Per test claim: PA required; PA submitted to above mentioned insurance via Latent Key/confirmation #/EOC [id number] Status is pending  Did not allow or ask for clinical documentation upload.

## 2024-10-08 ENCOUNTER — Ambulatory Visit: Admitting: Family Medicine

## 2024-10-09 ENCOUNTER — Other Ambulatory Visit: Payer: Self-pay | Admitting: Family Medicine

## 2024-10-26 ENCOUNTER — Ambulatory Visit: Admitting: Podiatry

## 2024-11-01 ENCOUNTER — Encounter: Payer: Self-pay | Admitting: Podiatry

## 2024-11-01 ENCOUNTER — Ambulatory Visit: Admitting: Podiatry

## 2024-11-01 VITALS — Ht 62.0 in | Wt 149.3 lb

## 2024-11-01 DIAGNOSIS — B351 Tinea unguium: Secondary | ICD-10-CM | POA: Diagnosis not present

## 2024-11-01 DIAGNOSIS — M79674 Pain in right toe(s): Secondary | ICD-10-CM

## 2024-11-01 DIAGNOSIS — M79675 Pain in left toe(s): Secondary | ICD-10-CM

## 2024-11-01 NOTE — Progress Notes (Signed)
 "  Chief Complaint  Patient presents with   Ingrown Toenail    Bil ingrown toenails.    HPI: 74 y.o. 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   by CT - moderate centrilobular and paraseptal   Ex-smoker 08/2014   currently using e cig   Microscopic colitis 06/2015   lymphocytic by colonoscopy - started entocort [name])   Osteoporosis, unspecified    Scoliosis of lumbar spine    Thoracic aortic atherosclerosis 02/2016   by CT    Past Surgical History:  Procedure Laterality Date   CATARACT EXTRACTION W/PHACO Right 02/21/2021   Procedure: CATARACT EXTRACTION PHACO AND INTRAOCULAR LENS PLACEMENT (IOC) RIGHT;  Surgeon: Mittie Gaskin, MD;  Location: [facility];  Service: Ophthalmology;  Laterality: Right;  cde 4.75 00:41.1 minutes 11.5%   CATARACT EXTRACTION W/PHACO Left 03/14/2021   Procedure: CATARACT EXTRACTION PHACO AND INTRAOCULAR LENS PLACEMENT (IOC) LEFT 4.07 00:44.6;  Surgeon: Mittie Gaskin, MD;  Location: [facility];  Service: Ophthalmology;  Laterality: Left;   COLONOSCOPY  06/2015   microscopic colitis [name])   DEXA  10/2008   Osteoporosis   DEXA  02/2014   T score osteopenia hip, osteoporosis spine, scoliosis   ELECTROMAGNETIC NAVIGATION BROCHOSCOPY  01/20/2020   Procedure: ELECTROMAGNETIC NAVIGATION BRONCHOSCOPY;  Surgeon: Brenna Adine CROME, DO;  Location: [facility];  Service: Pulmonary;;   ETT  10/2011   WNL, no evidence of ischemia, excellent exercise tolerance   EXPLORATORY LAPAROTOMY     INTERCOSTAL NERVE BLOCK Right 01/20/2020   Procedure: Intercostal Nerve Block;  Surgeon: Shyrl Linnie KIDD, MD;  Location: [facility];  Service: Thoracic;  Laterality: Right;   LAPAROSCOPY N/A 06/09/2024   Procedure:  LAPAROSCOPY, DIAGNOSTIC, EXPLORATORY LAPAROTOMY WITH SMALL BOWEL RESECTION;  Surgeon: Curvin Deward MOULD, MD;  Location: [facility];  Service: General;  Laterality: N/A;  Possible laparotomy   MRI head  07/2011   multiple foci deep and subcortical white matter, chronic ischemic vs demyelinating, no active disease   NODE DISSECTION  01/20/2020   Procedure: Node Dissection;  Surgeon: Shyrl Linnie KIDD, MD;  Location: [facility];  Service: Thoracic;;   SUBMUCOSAL TATTOO INJECTION  01/20/2020   Procedure: SUBSTITIAL TATTOO INJECTION;  Surgeon: Brenna Adine CROME, DO;  Location: [facility];  Service: Pulmonary;;   TONSILLECTOMY AND ADENOIDECTOMY     Vein removal     removed from legs   VIDEO ASSISTED THORACOSCOPY (VATS)/WEDGE RESECTION Right 01/20/2020   XI ROBOTIC ASSISTED THORASCOPY-RIGHT LOWER LOBE WEDGE RESECTION, RIGHT LOWER LOBECTOMYRightGeneral   VIDEO BRONCHOSCOPY  01/20/2020   Procedure: VIDEO BRONCHOSCOPY WITH FLUORO;  Surgeon: Brenna Adine CROME, DO;  Location: [facility];  Service: Pulmonary;;    Allergies[1]   Physical Exam: General: The patient is alert and oriented x3 in no acute distress.  Dermatology: Hyperkeratotic nails noted to the bilateral great toenails with associated tenderness.  Vascular: Palpable pedal pulses bilaterally. Capillary refill within normal limits.  No appreciable edema.  No erythema.  Neurological: Grossly intact via light touch  Musculoskeletal Exam: Nonambulatory in a wheelchair   Assessment/Plan of Care: 1.  Pain due to onychomycosis of toenails bilateral great toes  - Mechanical debridement of the bilateral great toenails was performed today using a nail nipper  is much as the patient can tolerate.  The left hallux nail plate was especially tender and patient consented to allow me to anesthetize the toe.  I was able to administer 1 cc of 2% lidocaine  plain across the dorsal aspect of the toe and the patient withdrew her leg.  Both myself and the hospice nurse  were able to calm the patient down and the lidocaine  was sufficient to anesthetize the toe in order for debridement of the nail plate. -Return to clinic PRN     Thresa EMERSON Sar, DPM [facility]  Dr. Thresa EMERSON Sar, DPM    [address] [place], [place] [address]                Office [phone]  Fax [phone]        [1]  Allergies Allergen Reactions   Donepezil  Nausea And Vomiting   Penicillins Swelling    REACTION: swelling tongue - States has taken Cephalosporins without difficulty in past   "

## 2025-05-06 ENCOUNTER — Ambulatory Visit
